# Patient Record
Sex: Female | Born: 1940 | Race: White | Hispanic: No | Marital: Married | Smoking: Former smoker
Health system: Southern US, Community
[De-identification: ages and names within clinical notes are randomized; demographics above are authoritative.]

## PROBLEM LIST (undated history)

## (undated) DIAGNOSIS — K219 Gastro-esophageal reflux disease without esophagitis: Secondary | ICD-10-CM

## (undated) DIAGNOSIS — M1990 Unspecified osteoarthritis, unspecified site: Secondary | ICD-10-CM

## (undated) DIAGNOSIS — I712 Thoracic aortic aneurysm, without rupture, unspecified: Secondary | ICD-10-CM

## (undated) DIAGNOSIS — I350 Nonrheumatic aortic (valve) stenosis: Secondary | ICD-10-CM

## (undated) DIAGNOSIS — D649 Anemia, unspecified: Secondary | ICD-10-CM

## (undated) DIAGNOSIS — I4891 Unspecified atrial fibrillation: Secondary | ICD-10-CM

## (undated) DIAGNOSIS — I10 Essential (primary) hypertension: Secondary | ICD-10-CM

## (undated) DIAGNOSIS — I639 Cerebral infarction, unspecified: Secondary | ICD-10-CM

## (undated) DIAGNOSIS — N200 Calculus of kidney: Secondary | ICD-10-CM

## (undated) DIAGNOSIS — I340 Nonrheumatic mitral (valve) insufficiency: Secondary | ICD-10-CM

## (undated) DIAGNOSIS — I255 Ischemic cardiomyopathy: Secondary | ICD-10-CM

## (undated) DIAGNOSIS — S32009A Unspecified fracture of unspecified lumbar vertebra, initial encounter for closed fracture: Secondary | ICD-10-CM

## (undated) DIAGNOSIS — N83202 Unspecified ovarian cyst, left side: Secondary | ICD-10-CM

## (undated) DIAGNOSIS — I499 Cardiac arrhythmia, unspecified: Secondary | ICD-10-CM

## (undated) DIAGNOSIS — I2510 Atherosclerotic heart disease of native coronary artery without angina pectoris: Secondary | ICD-10-CM

## (undated) DIAGNOSIS — D071 Carcinoma in situ of vulva: Secondary | ICD-10-CM

## (undated) DIAGNOSIS — I5022 Chronic systolic (congestive) heart failure: Secondary | ICD-10-CM

## (undated) DIAGNOSIS — I4901 Ventricular fibrillation: Principal | ICD-10-CM

## (undated) DIAGNOSIS — I82619 Acute embolism and thrombosis of superficial veins of unspecified upper extremity: Secondary | ICD-10-CM

## (undated) DIAGNOSIS — Z95810 Presence of automatic (implantable) cardiac defibrillator: Secondary | ICD-10-CM

## (undated) DIAGNOSIS — K5792 Diverticulitis of intestine, part unspecified, without perforation or abscess without bleeding: Secondary | ICD-10-CM

## (undated) DIAGNOSIS — D219 Benign neoplasm of connective and other soft tissue, unspecified: Secondary | ICD-10-CM

## (undated) HISTORY — DX: Anemia, unspecified: D64.9

## (undated) HISTORY — DX: Gastro-esophageal reflux disease without esophagitis: K21.9

## (undated) HISTORY — PX: CATARACT EXTRACTION: SUR2

## (undated) HISTORY — PX: FOOT SURGERY: SHX648

## (undated) HISTORY — DX: Carcinoma in situ of vulva: D07.1

## (undated) HISTORY — PX: EYE SURGERY: SHX253

## (undated) HISTORY — DX: Benign neoplasm of connective and other soft tissue, unspecified: D21.9

## (undated) HISTORY — DX: Unspecified atrial fibrillation: I48.91

## (undated) HISTORY — DX: Diverticulitis of intestine, part unspecified, without perforation or abscess without bleeding: K57.92

## (undated) HISTORY — DX: Unspecified ovarian cyst, left side: N83.202

## (undated) HISTORY — DX: Essential (primary) hypertension: I10

## (undated) HISTORY — PX: OTHER SURGICAL HISTORY: SHX169

## (undated) HISTORY — DX: Calculus of kidney: N20.0

## (undated) HISTORY — DX: Cerebral infarction, unspecified: I63.9

---

## 1999-03-25 ENCOUNTER — Other Ambulatory Visit: Admission: RE | Admit: 1999-03-25 | Discharge: 1999-03-25 | Payer: Self-pay | Admitting: Obstetrics and Gynecology

## 1999-03-31 ENCOUNTER — Other Ambulatory Visit: Admission: RE | Admit: 1999-03-31 | Discharge: 1999-03-31 | Payer: Self-pay | Admitting: Obstetrics and Gynecology

## 1999-03-31 ENCOUNTER — Encounter (INDEPENDENT_AMBULATORY_CARE_PROVIDER_SITE_OTHER): Payer: Self-pay | Admitting: Specialist

## 1999-05-27 ENCOUNTER — Ambulatory Visit (HOSPITAL_COMMUNITY): Admission: RE | Admit: 1999-05-27 | Discharge: 1999-05-27 | Payer: Self-pay | Admitting: Obstetrics and Gynecology

## 1999-05-27 ENCOUNTER — Encounter (INDEPENDENT_AMBULATORY_CARE_PROVIDER_SITE_OTHER): Payer: Self-pay | Admitting: Specialist

## 2000-04-12 ENCOUNTER — Other Ambulatory Visit: Admission: RE | Admit: 2000-04-12 | Discharge: 2000-04-12 | Payer: Self-pay | Admitting: Obstetrics and Gynecology

## 2000-09-01 DIAGNOSIS — D126 Benign neoplasm of colon, unspecified: Secondary | ICD-10-CM

## 2000-09-02 ENCOUNTER — Encounter (INDEPENDENT_AMBULATORY_CARE_PROVIDER_SITE_OTHER): Payer: Self-pay | Admitting: Specialist

## 2000-09-02 ENCOUNTER — Other Ambulatory Visit: Admission: RE | Admit: 2000-09-02 | Discharge: 2000-09-02 | Payer: Self-pay | Admitting: Gastroenterology

## 2001-05-02 ENCOUNTER — Other Ambulatory Visit: Admission: RE | Admit: 2001-05-02 | Discharge: 2001-05-02 | Payer: Self-pay | Admitting: Obstetrics and Gynecology

## 2001-09-19 ENCOUNTER — Encounter: Admission: RE | Admit: 2001-09-19 | Discharge: 2001-09-19 | Payer: Self-pay | Admitting: Family Medicine

## 2001-09-19 ENCOUNTER — Encounter: Payer: Self-pay | Admitting: Family Medicine

## 2001-10-27 ENCOUNTER — Ambulatory Visit (HOSPITAL_COMMUNITY): Admission: RE | Admit: 2001-10-27 | Discharge: 2001-10-27 | Payer: Self-pay | Admitting: Gastroenterology

## 2002-07-05 ENCOUNTER — Other Ambulatory Visit: Admission: RE | Admit: 2002-07-05 | Discharge: 2002-07-05 | Payer: Self-pay | Admitting: Obstetrics and Gynecology

## 2004-06-25 ENCOUNTER — Other Ambulatory Visit: Admission: RE | Admit: 2004-06-25 | Discharge: 2004-06-25 | Payer: Self-pay | Admitting: Obstetrics and Gynecology

## 2004-07-10 ENCOUNTER — Ambulatory Visit: Payer: Self-pay | Admitting: Gastroenterology

## 2004-07-23 ENCOUNTER — Ambulatory Visit: Payer: Self-pay | Admitting: Gastroenterology

## 2005-06-30 ENCOUNTER — Other Ambulatory Visit: Admission: RE | Admit: 2005-06-30 | Discharge: 2005-06-30 | Payer: Self-pay | Admitting: Obstetrics and Gynecology

## 2006-03-08 HISTORY — PX: MITRAL VALVE REPLACEMENT: SHX147

## 2006-03-08 HISTORY — PX: AORTIC ROOT REPLACEMENT: SHX1178

## 2006-03-08 HISTORY — PX: OTHER SURGICAL HISTORY: SHX169

## 2006-06-24 ENCOUNTER — Ambulatory Visit: Payer: Self-pay | Admitting: Cardiology

## 2006-06-24 ENCOUNTER — Encounter (INDEPENDENT_AMBULATORY_CARE_PROVIDER_SITE_OTHER): Payer: Self-pay | Admitting: Neurology

## 2006-06-24 ENCOUNTER — Inpatient Hospital Stay (HOSPITAL_COMMUNITY): Admission: EM | Admit: 2006-06-24 | Discharge: 2006-07-13 | Payer: Self-pay | Admitting: Emergency Medicine

## 2006-06-27 ENCOUNTER — Ambulatory Visit: Payer: Self-pay | Admitting: Cardiovascular Disease

## 2006-06-27 ENCOUNTER — Encounter: Payer: Self-pay | Admitting: Cardiovascular Disease

## 2006-07-01 ENCOUNTER — Encounter: Payer: Self-pay | Admitting: Cardiology

## 2006-07-01 ENCOUNTER — Ambulatory Visit: Payer: Self-pay | Admitting: Thoracic Surgery (Cardiothoracic Vascular Surgery)

## 2006-07-04 ENCOUNTER — Encounter: Payer: Self-pay | Admitting: Vascular Surgery

## 2006-07-07 ENCOUNTER — Encounter (INDEPENDENT_AMBULATORY_CARE_PROVIDER_SITE_OTHER): Payer: Self-pay | Admitting: Specialist

## 2006-07-18 ENCOUNTER — Ambulatory Visit: Payer: Self-pay | Admitting: Internal Medicine

## 2006-07-26 ENCOUNTER — Ambulatory Visit: Payer: Self-pay | Admitting: Cardiology

## 2006-07-26 LAB — CONVERTED CEMR LAB
Alkaline Phosphatase: 102 units/L (ref 39–117)
BUN: 11 mg/dL (ref 6–23)
Basophils Relative: 0.5 % (ref 0.0–1.0)
CO2: 25 meq/L (ref 19–32)
Creatinine, Ser: 0.6 mg/dL (ref 0.4–1.2)
Eosinophils Relative: 3.8 % (ref 0.0–5.0)
HCT: 28.4 % — ABNORMAL LOW (ref 36.0–46.0)
Hemoglobin: 9.4 g/dL — ABNORMAL LOW (ref 12.0–15.0)
LDL Cholesterol: 57 mg/dL (ref 0–99)
Monocytes Absolute: 0.3 10*3/uL (ref 0.2–0.7)
Neutrophils Relative %: 78.8 % — ABNORMAL HIGH (ref 43.0–77.0)
Potassium: 3.9 meq/L (ref 3.5–5.1)
RDW: 14.8 % — ABNORMAL HIGH (ref 11.5–14.6)
Sodium: 141 meq/L (ref 135–145)
Total Bilirubin: 0.6 mg/dL (ref 0.3–1.2)
Total Protein: 6.6 g/dL (ref 6.0–8.3)
VLDL: 26 mg/dL (ref 0–40)
WBC: 7.2 10*3/uL (ref 4.5–10.5)

## 2006-07-29 ENCOUNTER — Ambulatory Visit: Payer: Self-pay | Admitting: Internal Medicine

## 2006-08-04 ENCOUNTER — Emergency Department (HOSPITAL_COMMUNITY): Admission: EM | Admit: 2006-08-04 | Discharge: 2006-08-04 | Payer: Self-pay | Admitting: Emergency Medicine

## 2006-08-04 ENCOUNTER — Ambulatory Visit: Payer: Self-pay | Admitting: Cardiology

## 2006-08-05 ENCOUNTER — Encounter (HOSPITAL_COMMUNITY): Admission: RE | Admit: 2006-08-05 | Discharge: 2006-11-03 | Payer: Self-pay | Admitting: Cardiology

## 2006-08-09 ENCOUNTER — Ambulatory Visit: Payer: Self-pay | Admitting: Cardiology

## 2006-08-10 ENCOUNTER — Ambulatory Visit: Payer: Self-pay | Admitting: Thoracic Surgery (Cardiothoracic Vascular Surgery)

## 2006-08-12 ENCOUNTER — Ambulatory Visit: Payer: Self-pay | Admitting: Cardiology

## 2006-08-12 LAB — CONVERTED CEMR LAB
BUN: 16 mg/dL (ref 6–23)
Basophils Absolute: 0 10*3/uL (ref 0.0–0.1)
Calcium: 8.9 mg/dL (ref 8.4–10.5)
Chloride: 101 meq/L (ref 96–112)
Creatinine, Ser: 0.8 mg/dL (ref 0.4–1.2)
HCT: 29.6 % — ABNORMAL LOW (ref 36.0–46.0)
MCHC: 32.7 g/dL (ref 30.0–36.0)
Monocytes Relative: 4.1 % (ref 3.0–11.0)
Neutrophils Relative %: 81.9 % — ABNORMAL HIGH (ref 43.0–77.0)
Pro B Natriuretic peptide (BNP): 388 pg/mL — ABNORMAL HIGH (ref 0.0–100.0)
RBC: 3.44 M/uL — ABNORMAL LOW (ref 3.87–5.11)
RDW: 14.2 % (ref 11.5–14.6)

## 2006-08-17 ENCOUNTER — Ambulatory Visit: Payer: Self-pay

## 2006-08-17 ENCOUNTER — Encounter: Payer: Self-pay | Admitting: Cardiology

## 2006-08-19 ENCOUNTER — Ambulatory Visit: Payer: Self-pay | Admitting: Cardiovascular Disease

## 2006-08-22 ENCOUNTER — Ambulatory Visit: Payer: Self-pay | Admitting: Thoracic Surgery (Cardiothoracic Vascular Surgery)

## 2006-08-22 ENCOUNTER — Encounter
Admission: RE | Admit: 2006-08-22 | Discharge: 2006-08-22 | Payer: Self-pay | Admitting: Thoracic Surgery (Cardiothoracic Vascular Surgery)

## 2006-08-29 ENCOUNTER — Ambulatory Visit: Payer: Self-pay | Admitting: Cardiovascular Disease

## 2006-09-07 ENCOUNTER — Ambulatory Visit: Payer: Self-pay | Admitting: Cardiology

## 2006-09-07 LAB — CONVERTED CEMR LAB
Bilirubin, Direct: 0.1 mg/dL (ref 0.0–0.3)
CO2: 29 meq/L (ref 19–32)
Cholesterol: 126 mg/dL (ref 0–200)
GFR calc Af Amer: 81 mL/min
Glucose, Bld: 141 mg/dL — ABNORMAL HIGH (ref 70–99)
HDL: 44.5 mg/dL (ref >39.0)
Potassium: 4.5 meq/L (ref 3.5–5.1)
Total Bilirubin: 0.5 mg/dL (ref 0.3–1.2)
Total CHOL/HDL Ratio: 2.8
Total Protein: 7 g/dL (ref 6.0–8.3)
Triglycerides: 138 mg/dL (ref 0–149)

## 2006-09-14 ENCOUNTER — Ambulatory Visit: Payer: Self-pay | Admitting: Cardiology

## 2006-09-15 ENCOUNTER — Other Ambulatory Visit: Admission: RE | Admit: 2006-09-15 | Discharge: 2006-09-15 | Payer: Self-pay | Admitting: Obstetrics and Gynecology

## 2006-09-27 ENCOUNTER — Ambulatory Visit: Payer: Self-pay | Admitting: Cardiology

## 2006-10-03 ENCOUNTER — Ambulatory Visit: Payer: Self-pay | Admitting: Thoracic Surgery (Cardiothoracic Vascular Surgery)

## 2006-10-03 ENCOUNTER — Encounter
Admission: RE | Admit: 2006-10-03 | Discharge: 2006-10-03 | Payer: Self-pay | Admitting: Thoracic Surgery (Cardiothoracic Vascular Surgery)

## 2006-10-05 ENCOUNTER — Ambulatory Visit: Payer: Self-pay | Admitting: Cardiology

## 2006-10-05 LAB — CONVERTED CEMR LAB
Calcium: 9.2 mg/dL (ref 8.4–10.5)
Chloride: 103 meq/L (ref 96–112)
GFR calc Af Amer: 81 mL/min
GFR calc non Af Amer: 67 mL/min
Pro B Natriuretic peptide (BNP): 319 pg/mL — ABNORMAL HIGH (ref 0.0–100.0)
Sodium: 137 meq/L (ref 135–145)

## 2006-10-19 ENCOUNTER — Ambulatory Visit: Payer: Self-pay | Admitting: Internal Medicine

## 2006-10-28 ENCOUNTER — Ambulatory Visit: Payer: Self-pay | Admitting: Cardiovascular Disease

## 2006-11-03 ENCOUNTER — Ambulatory Visit: Payer: Self-pay | Admitting: Cardiology

## 2006-11-04 ENCOUNTER — Encounter (HOSPITAL_COMMUNITY): Admission: RE | Admit: 2006-11-04 | Discharge: 2006-12-06 | Payer: Self-pay | Admitting: Cardiology

## 2006-11-14 ENCOUNTER — Ambulatory Visit: Payer: Self-pay | Admitting: Cardiology

## 2006-11-21 ENCOUNTER — Ambulatory Visit: Payer: Self-pay | Admitting: Internal Medicine

## 2006-12-05 ENCOUNTER — Ambulatory Visit: Payer: Self-pay | Admitting: Cardiovascular Disease

## 2006-12-19 ENCOUNTER — Ambulatory Visit: Payer: Self-pay | Admitting: Cardiology

## 2007-01-09 ENCOUNTER — Ambulatory Visit: Payer: Self-pay | Admitting: Thoracic Surgery (Cardiothoracic Vascular Surgery)

## 2007-01-09 ENCOUNTER — Encounter
Admission: RE | Admit: 2007-01-09 | Discharge: 2007-01-09 | Payer: Self-pay | Admitting: Thoracic Surgery (Cardiothoracic Vascular Surgery)

## 2007-01-09 ENCOUNTER — Ambulatory Visit: Payer: Self-pay | Admitting: Cardiovascular Disease

## 2007-01-09 ENCOUNTER — Ambulatory Visit: Payer: Self-pay | Admitting: Cardiology

## 2007-01-09 LAB — CONVERTED CEMR LAB
BUN: 15 mg/dL (ref 6–23)
CO2: 28 meq/L (ref 19–32)
GFR calc Af Amer: 93 mL/min
Potassium: 3.9 meq/L (ref 3.5–5.1)
Prothrombin Time: 18 s — ABNORMAL HIGH (ref 10.9–13.3)
Sodium: 139 meq/L (ref 135–145)

## 2007-01-16 ENCOUNTER — Ambulatory Visit: Payer: Self-pay | Admitting: Cardiology

## 2007-02-06 ENCOUNTER — Ambulatory Visit: Payer: Self-pay | Admitting: Internal Medicine

## 2007-03-06 ENCOUNTER — Ambulatory Visit: Payer: Self-pay | Admitting: Cardiology

## 2007-03-20 ENCOUNTER — Ambulatory Visit: Payer: Self-pay | Admitting: Cardiology

## 2007-04-05 ENCOUNTER — Ambulatory Visit: Payer: Self-pay | Admitting: Cardiology

## 2007-04-13 ENCOUNTER — Ambulatory Visit: Payer: Self-pay | Admitting: Cardiology

## 2007-04-17 ENCOUNTER — Ambulatory Visit: Payer: Self-pay | Admitting: Cardiology

## 2007-04-17 LAB — CONVERTED CEMR LAB
AST: 15 units/L (ref 0–37)
Basophils Relative: 0.7 % (ref 0.0–1.0)
Bilirubin, Direct: 0.1 mg/dL (ref 0.0–0.3)
Eosinophils Relative: 4.3 % (ref 0.0–5.0)
HCT: 26.9 % — ABNORMAL LOW (ref 36.0–46.0)
HDL: 48.2 mg/dL (ref >39.0)
Hemoglobin: 8.7 g/dL — ABNORMAL LOW (ref 12.0–15.0)
LDL Cholesterol: 66 mg/dL (ref 0–99)
Lymphocytes Relative: 17.3 % (ref 12.0–46.0)
Monocytes Absolute: 0.4 10*3/uL (ref 0.2–0.7)
Monocytes Relative: 5.4 % (ref 3.0–11.0)
Neutro Abs: 5.5 10*3/uL (ref 1.4–7.7)
Neutrophils Relative %: 72.3 % (ref 43.0–77.0)
RDW: 14.1 % (ref 11.5–14.6)
Total Bilirubin: 0.5 mg/dL (ref 0.3–1.2)
Total Protein: 6.7 g/dL (ref 6.0–8.3)
VLDL: 23 mg/dL (ref 0–40)

## 2007-04-24 ENCOUNTER — Ambulatory Visit: Payer: Self-pay | Admitting: Cardiovascular Disease

## 2007-05-10 ENCOUNTER — Ambulatory Visit: Payer: Self-pay | Admitting: Cardiology

## 2007-05-29 ENCOUNTER — Ambulatory Visit: Payer: Self-pay | Admitting: Cardiology

## 2007-06-02 ENCOUNTER — Ambulatory Visit: Payer: Self-pay | Admitting: Cardiology

## 2007-06-02 ENCOUNTER — Encounter: Payer: Self-pay | Admitting: Cardiology

## 2007-06-02 LAB — CONVERTED CEMR LAB: Pro B Natriuretic peptide (BNP): 386 pg/mL — ABNORMAL HIGH (ref 0.0–100.0)

## 2007-06-06 ENCOUNTER — Ambulatory Visit: Payer: Self-pay | Admitting: Gastroenterology

## 2007-06-06 DIAGNOSIS — D509 Iron deficiency anemia, unspecified: Secondary | ICD-10-CM

## 2007-06-09 ENCOUNTER — Ambulatory Visit: Payer: Self-pay | Admitting: Cardiology

## 2007-06-09 LAB — CONVERTED CEMR LAB
Calcium: 9.1 mg/dL (ref 8.4–10.5)
Creatinine, Ser: 0.7 mg/dL (ref 0.4–1.2)
GFR calc Af Amer: 108 mL/min
Pro B Natriuretic peptide (BNP): 158 pg/mL — ABNORMAL HIGH (ref 0.0–100.0)
Sodium: 139 meq/L (ref 135–145)

## 2007-06-14 ENCOUNTER — Ambulatory Visit: Payer: Self-pay | Admitting: Gastroenterology

## 2007-06-14 LAB — CONVERTED CEMR LAB
OCCULT 1: NEGATIVE
OCCULT 4: NEGATIVE

## 2007-06-19 ENCOUNTER — Ambulatory Visit: Payer: Self-pay | Admitting: Cardiology

## 2007-07-04 ENCOUNTER — Ambulatory Visit: Payer: Self-pay | Admitting: Cardiology

## 2007-07-05 ENCOUNTER — Ambulatory Visit: Payer: Self-pay | Admitting: Cardiology

## 2007-07-06 ENCOUNTER — Ambulatory Visit: Payer: Self-pay | Admitting: Cardiology

## 2007-07-07 ENCOUNTER — Encounter: Payer: Self-pay | Admitting: Gastroenterology

## 2007-07-07 ENCOUNTER — Telehealth: Payer: Self-pay | Admitting: Gastroenterology

## 2007-07-07 ENCOUNTER — Ambulatory Visit: Payer: Self-pay | Admitting: Gastroenterology

## 2007-07-07 DIAGNOSIS — K5730 Diverticulosis of large intestine without perforation or abscess without bleeding: Secondary | ICD-10-CM | POA: Insufficient documentation

## 2007-07-07 LAB — HM COLONOSCOPY

## 2007-07-10 ENCOUNTER — Ambulatory Visit: Payer: Self-pay | Admitting: Internal Medicine

## 2007-07-10 ENCOUNTER — Ambulatory Visit: Payer: Self-pay | Admitting: Cardiology

## 2007-07-10 LAB — CONVERTED CEMR LAB
Eosinophils Relative: 4.4 % (ref 0.0–5.0)
HCT: 29.8 % — ABNORMAL LOW (ref 36.0–46.0)
Hemoglobin: 9.4 g/dL — ABNORMAL LOW (ref 12.0–15.0)
INR: 1.8 — ABNORMAL HIGH (ref 0.8–1.0)
Lymphocytes Relative: 15.1 % (ref 12.0–46.0)
Monocytes Absolute: 0.5 10*3/uL (ref 0.1–1.0)
Monocytes Relative: 6.5 % (ref 3.0–12.0)
Neutro Abs: 5.5 10*3/uL (ref 1.4–7.7)
Platelets: 500 10*3/uL — ABNORMAL HIGH (ref 150–400)
Prothrombin Time: 20.4 s — ABNORMAL HIGH (ref 10.9–13.3)
RDW: 16 % — ABNORMAL HIGH (ref 11.5–14.6)
WBC: 7.5 10*3/uL (ref 4.5–10.5)

## 2007-07-11 ENCOUNTER — Telehealth: Payer: Self-pay | Admitting: Gastroenterology

## 2007-07-11 ENCOUNTER — Ambulatory Visit: Payer: Self-pay | Admitting: Cardiology

## 2007-07-11 LAB — CONVERTED CEMR LAB
INR: 2.5 — ABNORMAL HIGH (ref 0.8–1.0)
Prothrombin Time: 27 s — ABNORMAL HIGH (ref 10.9–13.3)

## 2007-07-17 ENCOUNTER — Ambulatory Visit: Payer: Self-pay | Admitting: Thoracic Surgery (Cardiothoracic Vascular Surgery)

## 2007-07-17 ENCOUNTER — Telehealth: Payer: Self-pay | Admitting: Gastroenterology

## 2007-07-17 ENCOUNTER — Ambulatory Visit: Payer: Self-pay | Admitting: Cardiology

## 2007-07-21 ENCOUNTER — Ambulatory Visit: Payer: Self-pay | Admitting: Internal Medicine

## 2007-07-21 ENCOUNTER — Ambulatory Visit: Payer: Self-pay | Admitting: Gastroenterology

## 2007-07-21 DIAGNOSIS — I4891 Unspecified atrial fibrillation: Secondary | ICD-10-CM

## 2007-07-21 DIAGNOSIS — K8020 Calculus of gallbladder without cholecystitis without obstruction: Secondary | ICD-10-CM | POA: Insufficient documentation

## 2007-07-21 DIAGNOSIS — E785 Hyperlipidemia, unspecified: Secondary | ICD-10-CM

## 2007-07-21 DIAGNOSIS — I158 Other secondary hypertension: Secondary | ICD-10-CM | POA: Insufficient documentation

## 2007-07-21 DIAGNOSIS — E119 Type 2 diabetes mellitus without complications: Secondary | ICD-10-CM

## 2007-07-21 DIAGNOSIS — K449 Diaphragmatic hernia without obstruction or gangrene: Secondary | ICD-10-CM | POA: Insufficient documentation

## 2007-07-21 DIAGNOSIS — I359 Nonrheumatic aortic valve disorder, unspecified: Secondary | ICD-10-CM

## 2007-08-07 ENCOUNTER — Encounter: Payer: Self-pay | Admitting: Gastroenterology

## 2007-08-09 ENCOUNTER — Ambulatory Visit: Payer: Self-pay | Admitting: Gastroenterology

## 2007-08-09 DIAGNOSIS — K900 Celiac disease: Secondary | ICD-10-CM

## 2007-08-10 ENCOUNTER — Encounter: Payer: Self-pay | Admitting: Gastroenterology

## 2007-08-10 LAB — CONVERTED CEMR LAB
ALT: 8 units/L (ref 0–35)
BUN: 9 mg/dL (ref 6–23)
Basophils Relative: 0 % (ref 0.0–1.0)
CO2: 25 meq/L (ref 19–32)
Calcium: 8.8 mg/dL (ref 8.4–10.5)
Chloride: 106 meq/L (ref 96–112)
Creatinine, Ser: 0.8 mg/dL (ref 0.4–1.2)
Eosinophils Relative: 3.1 % (ref 0.0–5.0)
GFR calc Af Amer: 92 mL/min
GFR calc non Af Amer: 76 mL/min
Glucose, Bld: 174 mg/dL — ABNORMAL HIGH (ref 70–99)
Hemoglobin: 8.7 g/dL — ABNORMAL LOW (ref 12.0–15.0)
Lymphocytes Relative: 14.4 % (ref 12.0–46.0)
Neutro Abs: 6.8 10*3/uL (ref 1.4–7.7)
Neutrophils Relative %: 77.1 % — ABNORMAL HIGH (ref 43.0–77.0)
RBC: 3.51 M/uL — ABNORMAL LOW (ref 3.87–5.11)
Total Bilirubin: 0.7 mg/dL (ref 0.3–1.2)
WBC: 8.9 10*3/uL (ref 4.5–10.5)

## 2007-08-14 ENCOUNTER — Ambulatory Visit: Payer: Self-pay | Admitting: Cardiology

## 2007-08-21 ENCOUNTER — Encounter (HOSPITAL_COMMUNITY): Admission: RE | Admit: 2007-08-21 | Discharge: 2007-10-29 | Payer: Self-pay | Admitting: Internal Medicine

## 2007-08-21 ENCOUNTER — Telehealth: Payer: Self-pay | Admitting: Gastroenterology

## 2007-08-21 ENCOUNTER — Encounter: Payer: Self-pay | Admitting: Gastroenterology

## 2007-09-11 ENCOUNTER — Ambulatory Visit: Payer: Self-pay | Admitting: Internal Medicine

## 2007-09-27 ENCOUNTER — Ambulatory Visit: Payer: Self-pay | Admitting: Cardiology

## 2007-10-04 ENCOUNTER — Telehealth: Payer: Self-pay | Admitting: Gastroenterology

## 2007-10-04 ENCOUNTER — Ambulatory Visit: Payer: Self-pay | Admitting: Gastroenterology

## 2007-10-04 LAB — CONVERTED CEMR LAB
Basophils Absolute: 0.1 10*3/uL (ref 0.0–0.1)
Basophils Relative: 1.2 % (ref 0.0–3.0)
Lymphocytes Relative: 15.8 % (ref 12.0–46.0)
MCHC: 33.8 g/dL (ref 30.0–36.0)
Monocytes Relative: 6.8 % (ref 3.0–12.0)
Neutrophils Relative %: 72.9 % (ref 43.0–77.0)
RBC: 4.17 M/uL (ref 3.87–5.11)
WBC: 7.5 10*3/uL (ref 4.5–10.5)

## 2007-10-06 ENCOUNTER — Ambulatory Visit: Payer: Self-pay | Admitting: Gastroenterology

## 2007-10-18 ENCOUNTER — Ambulatory Visit: Payer: Self-pay | Admitting: Cardiology

## 2007-11-01 ENCOUNTER — Ambulatory Visit: Payer: Self-pay | Admitting: Cardiovascular Disease

## 2007-11-22 ENCOUNTER — Ambulatory Visit: Payer: Self-pay | Admitting: Cardiovascular Disease

## 2007-12-13 ENCOUNTER — Ambulatory Visit: Payer: Self-pay | Admitting: Obstetrics and Gynecology

## 2007-12-13 ENCOUNTER — Encounter: Payer: Self-pay | Admitting: Obstetrics and Gynecology

## 2007-12-13 ENCOUNTER — Other Ambulatory Visit: Admission: RE | Admit: 2007-12-13 | Discharge: 2007-12-13 | Payer: Self-pay | Admitting: Obstetrics and Gynecology

## 2007-12-18 ENCOUNTER — Ambulatory Visit: Payer: Self-pay | Admitting: Cardiology

## 2007-12-25 ENCOUNTER — Ambulatory Visit: Payer: Self-pay | Admitting: Thoracic Surgery (Cardiothoracic Vascular Surgery)

## 2008-01-02 ENCOUNTER — Telehealth: Payer: Self-pay | Admitting: Gastroenterology

## 2008-01-04 ENCOUNTER — Ambulatory Visit: Payer: Self-pay | Admitting: Gastroenterology

## 2008-01-04 LAB — CONVERTED CEMR LAB
Eosinophils Relative: 3.6 % (ref 0.0–5.0)
HCT: 35.5 % — ABNORMAL LOW (ref 36.0–46.0)
Hemoglobin: 12.3 g/dL (ref 12.0–15.0)
MCV: 90.5 fL (ref 78.0–100.0)
Monocytes Absolute: 0.3 10*3/uL (ref 0.1–1.0)
Monocytes Relative: 4.4 % (ref 3.0–12.0)
Neutro Abs: 6.2 10*3/uL (ref 1.4–7.7)
RDW: 12.9 % (ref 11.5–14.6)
Vitamin B-12: 251 pg/mL (ref 211–911)

## 2008-01-11 ENCOUNTER — Ambulatory Visit: Payer: Self-pay | Admitting: Obstetrics and Gynecology

## 2008-01-15 ENCOUNTER — Ambulatory Visit: Payer: Self-pay | Admitting: Cardiology

## 2008-01-29 ENCOUNTER — Ambulatory Visit: Payer: Self-pay | Admitting: Internal Medicine

## 2008-02-05 ENCOUNTER — Ambulatory Visit: Payer: Self-pay | Admitting: Gastroenterology

## 2008-02-05 ENCOUNTER — Ambulatory Visit: Payer: Self-pay | Admitting: Cardiology

## 2008-02-07 ENCOUNTER — Ambulatory Visit: Payer: Self-pay | Admitting: Gastroenterology

## 2008-02-07 LAB — CONVERTED CEMR LAB
Albumin: 3.7 g/dL (ref 3.5–5.2)
Basophils Absolute: 0 10*3/uL (ref 0.0–0.1)
HCT: 36.7 % (ref 36.0–46.0)
Hemoglobin: 12.6 g/dL (ref 12.0–15.0)
MCHC: 34.3 g/dL (ref 30.0–36.0)
Monocytes Absolute: 0.3 10*3/uL (ref 0.1–1.0)
Neutro Abs: 5.6 10*3/uL (ref 1.4–7.7)
RDW: 12.8 % (ref 11.5–14.6)
Saturation Ratios: 23.1 % (ref 20.0–50.0)
Total Protein: 7 g/dL (ref 6.0–8.3)
Transferrin: 269.3 mg/dL (ref >=212.0)
Vitamin B-12: 229 pg/mL (ref 211–911)

## 2008-02-26 ENCOUNTER — Ambulatory Visit: Payer: Self-pay | Admitting: Cardiology

## 2008-02-27 ENCOUNTER — Telehealth: Payer: Self-pay | Admitting: Gastroenterology

## 2008-03-11 ENCOUNTER — Ambulatory Visit: Payer: Self-pay | Admitting: Cardiology

## 2008-03-25 ENCOUNTER — Ambulatory Visit: Payer: Self-pay | Admitting: Cardiology

## 2008-03-25 LAB — CONVERTED CEMR LAB
Basophils Absolute: 0.1 10*3/uL (ref 0.0–0.1)
CO2: 31 meq/L (ref 19–32)
Calcium: 9.2 mg/dL (ref 8.4–10.5)
Lymphocytes Relative: 14.5 % (ref 12.0–46.0)
MCHC: 33.8 g/dL (ref 30.0–36.0)
Neutro Abs: 5.4 10*3/uL (ref 1.4–7.7)
Neutrophils Relative %: 76.3 % (ref 43.0–77.0)
RDW: 12.9 % (ref 11.5–14.6)
Sodium: 141 meq/L (ref 135–145)
TSH: 1.13 microintl units/mL (ref 0.35–5.50)

## 2008-04-15 ENCOUNTER — Ambulatory Visit: Payer: Self-pay | Admitting: Cardiology

## 2008-05-06 ENCOUNTER — Ambulatory Visit: Payer: Self-pay | Admitting: Cardiology

## 2008-05-07 ENCOUNTER — Ambulatory Visit: Payer: Self-pay | Admitting: Obstetrics and Gynecology

## 2008-05-13 ENCOUNTER — Ambulatory Visit: Payer: Self-pay | Admitting: Cardiology

## 2008-06-06 HISTORY — PX: CORONARY ARTERY BYPASS GRAFT: SHX141

## 2008-06-10 ENCOUNTER — Ambulatory Visit: Payer: Self-pay | Admitting: Cardiology

## 2008-06-20 ENCOUNTER — Telehealth: Payer: Self-pay | Admitting: Nurse Practitioner

## 2008-06-21 ENCOUNTER — Inpatient Hospital Stay (HOSPITAL_COMMUNITY): Admission: EM | Admit: 2008-06-21 | Discharge: 2008-07-02 | Payer: Self-pay | Admitting: Cardiovascular Disease

## 2008-06-21 ENCOUNTER — Ambulatory Visit: Payer: Self-pay | Admitting: Cardiology

## 2008-06-25 ENCOUNTER — Encounter: Payer: Self-pay | Admitting: Cardiology

## 2008-06-25 ENCOUNTER — Ambulatory Visit: Payer: Self-pay | Admitting: Surgery

## 2008-06-26 ENCOUNTER — Encounter: Payer: Self-pay | Admitting: Surgery

## 2008-06-27 ENCOUNTER — Encounter: Payer: Self-pay | Admitting: Thoracic Surgery (Cardiothoracic Vascular Surgery)

## 2008-07-04 ENCOUNTER — Ambulatory Visit: Payer: Self-pay | Admitting: Internal Medicine

## 2008-07-11 ENCOUNTER — Ambulatory Visit: Payer: Self-pay | Admitting: Internal Medicine

## 2008-07-19 ENCOUNTER — Ambulatory Visit: Payer: Self-pay | Admitting: Cardiology

## 2008-07-22 ENCOUNTER — Encounter: Payer: Self-pay | Admitting: Cardiology

## 2008-07-22 ENCOUNTER — Encounter
Admission: RE | Admit: 2008-07-22 | Discharge: 2008-07-22 | Payer: Self-pay | Admitting: Thoracic Surgery (Cardiothoracic Vascular Surgery)

## 2008-07-22 ENCOUNTER — Ambulatory Visit: Payer: Self-pay | Admitting: Thoracic Surgery (Cardiothoracic Vascular Surgery)

## 2008-07-24 ENCOUNTER — Ambulatory Visit: Payer: Self-pay | Admitting: Internal Medicine

## 2008-07-30 ENCOUNTER — Ambulatory Visit: Payer: Self-pay | Admitting: Cardiology

## 2008-07-30 DIAGNOSIS — I2589 Other forms of chronic ischemic heart disease: Secondary | ICD-10-CM

## 2008-07-30 DIAGNOSIS — Z954 Presence of other heart-valve replacement: Secondary | ICD-10-CM

## 2008-07-30 DIAGNOSIS — I10 Essential (primary) hypertension: Secondary | ICD-10-CM

## 2008-08-01 ENCOUNTER — Encounter (HOSPITAL_COMMUNITY): Admission: RE | Admit: 2008-08-01 | Discharge: 2008-10-30 | Payer: Self-pay | Admitting: Cardiology

## 2008-08-06 ENCOUNTER — Encounter: Payer: Self-pay | Admitting: *Deleted

## 2008-08-07 ENCOUNTER — Encounter: Payer: Self-pay | Admitting: Cardiology

## 2008-08-09 ENCOUNTER — Ambulatory Visit: Payer: Self-pay | Admitting: Cardiology

## 2008-08-09 LAB — CONVERTED CEMR LAB: Protime: 19.4

## 2008-08-26 ENCOUNTER — Ambulatory Visit: Payer: Self-pay | Admitting: Internal Medicine

## 2008-08-26 LAB — CONVERTED CEMR LAB: Protime: 18.1

## 2008-08-28 ENCOUNTER — Encounter: Payer: Self-pay | Admitting: Cardiology

## 2008-08-30 ENCOUNTER — Encounter (INDEPENDENT_AMBULATORY_CARE_PROVIDER_SITE_OTHER): Payer: Self-pay | Admitting: *Deleted

## 2008-08-30 ENCOUNTER — Telehealth: Payer: Self-pay | Admitting: Cardiology

## 2008-09-10 ENCOUNTER — Encounter: Payer: Self-pay | Admitting: Cardiology

## 2008-09-10 ENCOUNTER — Ambulatory Visit: Payer: Self-pay

## 2008-09-11 ENCOUNTER — Encounter: Payer: Self-pay | Admitting: *Deleted

## 2008-09-23 ENCOUNTER — Telehealth: Payer: Self-pay | Admitting: Internal Medicine

## 2008-09-23 ENCOUNTER — Ambulatory Visit: Payer: Self-pay | Admitting: Cardiology

## 2008-09-30 ENCOUNTER — Encounter: Payer: Self-pay | Admitting: Cardiology

## 2008-10-01 ENCOUNTER — Encounter: Payer: Self-pay | Admitting: Cardiology

## 2008-10-01 ENCOUNTER — Ambulatory Visit: Payer: Self-pay | Admitting: Internal Medicine

## 2008-10-01 LAB — CONVERTED CEMR LAB: Prothrombin Time: 17.3 s

## 2008-10-03 ENCOUNTER — Encounter (INDEPENDENT_AMBULATORY_CARE_PROVIDER_SITE_OTHER): Payer: Self-pay | Admitting: *Deleted

## 2008-10-16 ENCOUNTER — Ambulatory Visit: Payer: Self-pay | Admitting: Internal Medicine

## 2008-10-16 LAB — CONVERTED CEMR LAB
INR: 2.6
POC INR: 2.6

## 2008-10-17 ENCOUNTER — Ambulatory Visit: Payer: Self-pay | Admitting: Internal Medicine

## 2008-10-17 ENCOUNTER — Telehealth: Payer: Self-pay | Admitting: Internal Medicine

## 2008-10-17 LAB — CONVERTED CEMR LAB
ALT: 11 units/L (ref 0–35)
AST: 18 units/L (ref 0–37)
Basophils Relative: 0.1 % (ref 0.0–3.0)
Chloride: 102 meq/L (ref 96–112)
Cholesterol: 227 mg/dL — ABNORMAL HIGH (ref 0–200)
Direct LDL: 132.3 mg/dL
Eosinophils Relative: 3.3 % (ref 0.0–5.0)
HCT: 36.3 % (ref 36.0–46.0)
Hemoglobin: 12.2 g/dL (ref 12.0–15.0)
Ketones, ur: NEGATIVE mg/dL
Leukocytes, UA: NEGATIVE
Lymphs Abs: 0.9 10*3/uL (ref 0.7–4.0)
MCV: 91.2 fL (ref 78.0–100.0)
Magnesium: 1.8 mg/dL (ref 1.5–2.5)
Microalb Creat Ratio: 4.6 mg/g (ref 0.0–30.0)
Monocytes Absolute: 0.4 10*3/uL (ref 0.1–1.0)
Monocytes Relative: 5.6 % (ref 3.0–12.0)
Potassium: 4.3 meq/L (ref 3.5–5.1)
RBC: 3.99 M/uL (ref 3.87–5.11)
Saturation Ratios: 15 % — ABNORMAL LOW (ref 20.0–50.0)
Sodium: 140 meq/L (ref 135–145)
Specific Gravity, Urine: 1.01 (ref 1.000–1.030)
TSH: 3.58 microintl units/mL (ref 0.35–5.50)
Total CHOL/HDL Ratio: 3
Total Protein: 7.4 g/dL (ref 6.0–8.3)
Transferrin: 313.8 mg/dL (ref 212.0–360.0)
Triglycerides: 125 mg/dL (ref 0.0–149.0)
Urine Glucose: NEGATIVE mg/dL
Urobilinogen, UA: 1 (ref 0.0–1.0)
WBC: 6.5 10*3/uL (ref 4.5–10.5)
pH: 6.5 (ref 5.0–8.0)

## 2008-10-18 ENCOUNTER — Telehealth: Payer: Self-pay | Admitting: Internal Medicine

## 2008-10-22 ENCOUNTER — Telehealth: Payer: Self-pay | Admitting: Cardiology

## 2008-10-22 ENCOUNTER — Ambulatory Visit: Payer: Self-pay | Admitting: Internal Medicine

## 2008-10-22 DIAGNOSIS — E538 Deficiency of other specified B group vitamins: Secondary | ICD-10-CM | POA: Insufficient documentation

## 2008-10-28 ENCOUNTER — Ambulatory Visit: Payer: Self-pay | Admitting: Thoracic Surgery (Cardiothoracic Vascular Surgery)

## 2008-10-28 ENCOUNTER — Encounter
Admission: RE | Admit: 2008-10-28 | Discharge: 2008-10-28 | Payer: Self-pay | Admitting: Thoracic Surgery (Cardiothoracic Vascular Surgery)

## 2008-10-31 ENCOUNTER — Encounter (HOSPITAL_COMMUNITY): Admission: RE | Admit: 2008-10-31 | Discharge: 2009-01-10 | Payer: Self-pay | Admitting: Cardiology

## 2008-11-05 ENCOUNTER — Ambulatory Visit: Payer: Self-pay | Admitting: Internal Medicine

## 2008-11-05 DIAGNOSIS — E559 Vitamin D deficiency, unspecified: Secondary | ICD-10-CM | POA: Insufficient documentation

## 2008-11-07 ENCOUNTER — Ambulatory Visit: Payer: Self-pay | Admitting: Cardiology

## 2008-11-18 LAB — CONVERTED CEMR LAB: Pap Smear: NORMAL

## 2008-11-19 ENCOUNTER — Ambulatory Visit: Payer: Self-pay | Admitting: Internal Medicine

## 2008-11-20 ENCOUNTER — Ambulatory Visit: Payer: Self-pay | Admitting: Obstetrics and Gynecology

## 2008-11-26 ENCOUNTER — Ambulatory Visit: Payer: Self-pay | Admitting: Internal Medicine

## 2008-11-26 DIAGNOSIS — N390 Urinary tract infection, site not specified: Secondary | ICD-10-CM

## 2008-11-26 LAB — CONVERTED CEMR LAB
Blood in Urine, dipstick: NEGATIVE
Glucose, Urine, Semiquant: NEGATIVE
Nitrite: NEGATIVE
Protein, U semiquant: 30
Urobilinogen, UA: 0.2
pH: 5

## 2008-12-05 ENCOUNTER — Ambulatory Visit: Payer: Self-pay | Admitting: Internal Medicine

## 2008-12-11 ENCOUNTER — Encounter: Payer: Self-pay | Admitting: Internal Medicine

## 2008-12-13 ENCOUNTER — Encounter: Payer: Self-pay | Admitting: Cardiology

## 2008-12-16 ENCOUNTER — Ambulatory Visit: Payer: Self-pay | Admitting: Internal Medicine

## 2009-01-02 ENCOUNTER — Ambulatory Visit: Payer: Self-pay | Admitting: Cardiology

## 2009-01-02 ENCOUNTER — Telehealth: Payer: Self-pay | Admitting: Cardiology

## 2009-01-03 ENCOUNTER — Ambulatory Visit: Payer: Self-pay | Admitting: Internal Medicine

## 2009-01-16 ENCOUNTER — Ambulatory Visit: Payer: Self-pay | Admitting: Internal Medicine

## 2009-01-16 LAB — CONVERTED CEMR LAB: POC INR: 1.7

## 2009-01-27 ENCOUNTER — Ambulatory Visit: Payer: Self-pay | Admitting: Cardiology

## 2009-01-27 DIAGNOSIS — Z9889 Other specified postprocedural states: Secondary | ICD-10-CM | POA: Insufficient documentation

## 2009-01-31 ENCOUNTER — Ambulatory Visit: Payer: Self-pay | Admitting: Internal Medicine

## 2009-01-31 LAB — CONVERTED CEMR LAB: POC INR: 2.5

## 2009-02-06 ENCOUNTER — Encounter: Payer: Self-pay | Admitting: Internal Medicine

## 2009-02-12 ENCOUNTER — Other Ambulatory Visit: Admission: RE | Admit: 2009-02-12 | Discharge: 2009-02-12 | Payer: Self-pay | Admitting: Obstetrics and Gynecology

## 2009-02-12 ENCOUNTER — Ambulatory Visit: Payer: Self-pay | Admitting: Obstetrics and Gynecology

## 2009-02-24 ENCOUNTER — Ambulatory Visit: Payer: Self-pay | Admitting: Cardiology

## 2009-02-24 LAB — CONVERTED CEMR LAB: POC INR: 3

## 2009-03-24 ENCOUNTER — Ambulatory Visit: Payer: Self-pay | Admitting: Cardiology

## 2009-03-24 LAB — CONVERTED CEMR LAB: POC INR: 2.3

## 2009-04-21 ENCOUNTER — Ambulatory Visit: Payer: Self-pay | Admitting: Cardiology

## 2009-04-21 LAB — CONVERTED CEMR LAB: POC INR: 1.6

## 2009-04-28 ENCOUNTER — Ambulatory Visit: Payer: Self-pay | Admitting: Thoracic Surgery (Cardiothoracic Vascular Surgery)

## 2009-04-28 ENCOUNTER — Encounter: Payer: Self-pay | Admitting: Cardiology

## 2009-05-01 ENCOUNTER — Encounter: Payer: Self-pay | Admitting: Cardiology

## 2009-05-02 ENCOUNTER — Telehealth: Payer: Self-pay | Admitting: Cardiology

## 2009-05-02 ENCOUNTER — Telehealth (INDEPENDENT_AMBULATORY_CARE_PROVIDER_SITE_OTHER): Payer: Self-pay | Admitting: *Deleted

## 2009-05-06 ENCOUNTER — Ambulatory Visit: Payer: Self-pay | Admitting: Cardiology

## 2009-05-09 ENCOUNTER — Telehealth: Payer: Self-pay | Admitting: Internal Medicine

## 2009-05-15 ENCOUNTER — Ambulatory Visit: Payer: Self-pay | Admitting: Internal Medicine

## 2009-05-15 LAB — CONVERTED CEMR LAB
AST: 19 units/L (ref 0–37)
Alkaline Phosphatase: 69 units/L (ref 39–117)
Basophils Relative: 0.2 % (ref 0.0–3.0)
Bilirubin, Direct: 0.1 mg/dL (ref 0.0–0.3)
CO2: 31 meq/L (ref 19–32)
Calcium: 9.2 mg/dL (ref 8.4–10.5)
Creatinine, Ser: 0.8 mg/dL (ref 0.4–1.2)
Eosinophils Absolute: 0.2 10*3/uL (ref 0.0–0.7)
Folate: >20 ng/mL
GFR calc non Af Amer: 75.82 mL/min (ref >60)
Hemoglobin: 11.6 g/dL — ABNORMAL LOW (ref 12.0–15.0)
Hgb A1c MFr Bld: 6.1 % (ref 4.6–6.5)
Iron: 97 ug/dL (ref 42–145)
Lymphocytes Relative: 12.4 % (ref 12.0–46.0)
MCHC: 33.2 g/dL (ref 30.0–36.0)
Monocytes Relative: 4.1 % (ref 3.0–12.0)
Neutro Abs: 6 10*3/uL (ref 1.4–7.7)
Neutrophils Relative %: 81.1 % — ABNORMAL HIGH (ref 43.0–77.0)
RBC: 3.85 M/uL — ABNORMAL LOW (ref 3.87–5.11)
Saturation Ratios: 24 % (ref 20.0–50.0)
Sodium: 141 meq/L (ref 135–145)
Total Protein: 7.2 g/dL (ref 6.0–8.3)
Vit D, 25-Hydroxy: 36 ng/mL (ref 30–89)
WBC: 7.4 10*3/uL (ref 4.5–10.5)

## 2009-05-16 ENCOUNTER — Telehealth: Payer: Self-pay | Admitting: Cardiology

## 2009-05-19 ENCOUNTER — Ambulatory Visit: Payer: Self-pay | Admitting: Internal Medicine

## 2009-05-20 ENCOUNTER — Encounter: Payer: Self-pay | Admitting: Internal Medicine

## 2009-05-27 ENCOUNTER — Ambulatory Visit (HOSPITAL_COMMUNITY): Admission: RE | Admit: 2009-05-27 | Discharge: 2009-05-27 | Payer: Self-pay | Admitting: Cardiology

## 2009-05-27 ENCOUNTER — Encounter: Payer: Self-pay | Admitting: Cardiology

## 2009-05-27 ENCOUNTER — Ambulatory Visit: Payer: Self-pay

## 2009-05-27 ENCOUNTER — Ambulatory Visit: Payer: Self-pay | Admitting: Cardiology

## 2009-06-16 ENCOUNTER — Ambulatory Visit: Payer: Self-pay | Admitting: Cardiovascular Disease

## 2009-06-16 LAB — CONVERTED CEMR LAB: POC INR: 2.2

## 2009-06-30 ENCOUNTER — Telehealth: Payer: Self-pay | Admitting: Cardiology

## 2009-07-10 ENCOUNTER — Encounter (INDEPENDENT_AMBULATORY_CARE_PROVIDER_SITE_OTHER): Payer: Self-pay | Admitting: *Deleted

## 2009-07-14 ENCOUNTER — Ambulatory Visit: Payer: Self-pay | Admitting: Cardiology

## 2009-07-15 ENCOUNTER — Telehealth: Payer: Self-pay | Admitting: Gastroenterology

## 2009-07-16 ENCOUNTER — Telehealth: Payer: Self-pay | Admitting: Cardiology

## 2009-07-28 ENCOUNTER — Ambulatory Visit: Payer: Self-pay | Admitting: Cardiology

## 2009-08-11 ENCOUNTER — Ambulatory Visit: Payer: Self-pay | Admitting: Cardiology

## 2009-08-18 ENCOUNTER — Telehealth: Payer: Self-pay | Admitting: Cardiology

## 2009-08-27 ENCOUNTER — Telehealth: Payer: Self-pay | Admitting: Cardiology

## 2009-09-02 ENCOUNTER — Telehealth: Payer: Self-pay | Admitting: Cardiology

## 2009-09-09 ENCOUNTER — Ambulatory Visit: Payer: Self-pay | Admitting: Internal Medicine

## 2009-09-11 ENCOUNTER — Ambulatory Visit: Payer: Self-pay | Admitting: Internal Medicine

## 2009-09-11 ENCOUNTER — Ambulatory Visit: Payer: Self-pay

## 2009-09-15 ENCOUNTER — Telehealth: Payer: Self-pay | Admitting: Cardiology

## 2009-09-22 ENCOUNTER — Ambulatory Visit: Payer: Self-pay | Admitting: Cardiology

## 2009-09-22 LAB — CONVERTED CEMR LAB: POC INR: 2.6

## 2009-09-24 ENCOUNTER — Ambulatory Visit: Payer: Self-pay | Admitting: Internal Medicine

## 2009-09-24 LAB — CONVERTED CEMR LAB
Albumin: 4 g/dL (ref 3.5–5.2)
BUN: 12 mg/dL (ref 6–23)
CO2: 30 meq/L (ref 19–32)
Calcium: 8.9 mg/dL (ref 8.4–10.5)
Creatinine, Ser: 0.8 mg/dL (ref 0.4–1.2)
Glucose, Bld: 114 mg/dL — ABNORMAL HIGH (ref 70–99)
HDL: 57.7 mg/dL (ref >39.00)
Hgb A1c MFr Bld: 6.1 % (ref 4.6–6.5)
TSH: 1.03 microintl units/mL (ref 0.35–5.50)
Total Protein: 6.8 g/dL (ref 6.0–8.3)
Triglycerides: 116 mg/dL (ref 0.0–149.0)
VLDL: 23.2 mg/dL (ref 0.0–40.0)

## 2009-10-13 ENCOUNTER — Ambulatory Visit: Payer: Self-pay | Admitting: Cardiovascular Disease

## 2009-10-27 ENCOUNTER — Ambulatory Visit: Payer: Self-pay | Admitting: Cardiology

## 2009-10-28 ENCOUNTER — Telehealth: Payer: Self-pay | Admitting: Cardiology

## 2009-10-29 ENCOUNTER — Telehealth: Payer: Self-pay | Admitting: Cardiology

## 2009-10-29 ENCOUNTER — Inpatient Hospital Stay (HOSPITAL_COMMUNITY): Admission: EM | Admit: 2009-10-29 | Discharge: 2009-10-30 | Payer: Self-pay | Admitting: Emergency Medicine

## 2009-10-29 ENCOUNTER — Ambulatory Visit: Payer: Self-pay | Admitting: Cardiology

## 2009-10-29 ENCOUNTER — Encounter: Payer: Self-pay | Admitting: Cardiology

## 2009-11-06 ENCOUNTER — Ambulatory Visit: Payer: Self-pay | Admitting: Cardiology

## 2009-11-06 ENCOUNTER — Ambulatory Visit: Payer: Self-pay

## 2009-11-11 ENCOUNTER — Ambulatory Visit: Payer: Self-pay | Admitting: Internal Medicine

## 2009-11-11 LAB — CONVERTED CEMR LAB: POC INR: 3.6

## 2009-11-18 ENCOUNTER — Ambulatory Visit: Payer: Self-pay | Admitting: Cardiovascular Disease

## 2009-11-24 ENCOUNTER — Ambulatory Visit: Payer: Self-pay | Admitting: Obstetrics and Gynecology

## 2009-11-27 ENCOUNTER — Telehealth: Payer: Self-pay | Admitting: Cardiology

## 2009-12-08 ENCOUNTER — Ambulatory Visit: Payer: Self-pay | Admitting: Internal Medicine

## 2009-12-15 ENCOUNTER — Ambulatory Visit: Payer: Self-pay | Admitting: Cardiology

## 2009-12-15 LAB — CONVERTED CEMR LAB: POC INR: 2.6

## 2010-01-12 ENCOUNTER — Ambulatory Visit: Payer: Self-pay | Admitting: Cardiovascular Disease

## 2010-01-12 ENCOUNTER — Ambulatory Visit: Payer: Self-pay | Admitting: Cardiology

## 2010-01-12 LAB — CONVERTED CEMR LAB: POC INR: 2.7

## 2010-01-26 ENCOUNTER — Ambulatory Visit: Payer: Self-pay | Admitting: Internal Medicine

## 2010-01-26 LAB — CONVERTED CEMR LAB
AST: 20 units/L (ref 0–37)
Albumin: 4.1 g/dL (ref 3.5–5.2)
Alkaline Phosphatase: 81 units/L (ref 39–117)
Basophils Absolute: 0 10*3/uL (ref 0.0–0.1)
Chloride: 104 meq/L (ref 96–112)
Creatinine, Ser: 0.9 mg/dL (ref 0.4–1.2)
Folate: >20 ng/mL
Hemoglobin: 11.4 g/dL — ABNORMAL LOW (ref 12.0–15.0)
Lymphocytes Relative: 10.8 % — ABNORMAL LOW (ref 12.0–46.0)
Monocytes Relative: 5 % (ref 3.0–12.0)
Neutro Abs: 5.7 10*3/uL (ref 1.4–7.7)
Potassium: 4.9 meq/L (ref 3.5–5.1)
RDW: 14.3 % (ref 11.5–14.6)
Specific Gravity, Urine: 1.025 (ref 1.000–1.030)
Total Bilirubin: 0.4 mg/dL (ref 0.3–1.2)
Total Protein, Urine: NEGATIVE mg/dL
Urine Glucose: NEGATIVE mg/dL
Vit D, 25-Hydroxy: 58 ng/mL (ref 30–89)
Vitamin B-12: >1500 pg/mL — ABNORMAL HIGH (ref 211–911)
pH: 6 (ref 5.0–8.0)

## 2010-01-27 ENCOUNTER — Telehealth (INDEPENDENT_AMBULATORY_CARE_PROVIDER_SITE_OTHER): Payer: Self-pay | Admitting: *Deleted

## 2010-02-09 ENCOUNTER — Ambulatory Visit: Payer: Self-pay | Admitting: Internal Medicine

## 2010-02-23 ENCOUNTER — Encounter: Payer: Self-pay | Admitting: Internal Medicine

## 2010-02-23 LAB — HM MAMMOGRAPHY: HM Mammogram: NORMAL

## 2010-02-25 ENCOUNTER — Telehealth: Payer: Self-pay | Admitting: Internal Medicine

## 2010-03-16 ENCOUNTER — Ambulatory Visit
Admission: RE | Admit: 2010-03-16 | Discharge: 2010-03-16 | Payer: Self-pay | Source: Home / Self Care | Attending: Obstetrics and Gynecology | Admitting: Obstetrics and Gynecology

## 2010-03-16 ENCOUNTER — Ambulatory Visit: Admission: RE | Admit: 2010-03-16 | Discharge: 2010-03-16 | Payer: Self-pay | Source: Home / Self Care

## 2010-03-16 LAB — CONVERTED CEMR LAB: POC INR: 3.2

## 2010-03-17 ENCOUNTER — Encounter: Payer: Self-pay | Admitting: Internal Medicine

## 2010-03-18 ENCOUNTER — Telehealth: Payer: Self-pay | Admitting: Internal Medicine

## 2010-03-27 ENCOUNTER — Ambulatory Visit
Admission: RE | Admit: 2010-03-27 | Discharge: 2010-03-27 | Payer: Self-pay | Source: Home / Self Care | Attending: Internal Medicine | Admitting: Internal Medicine

## 2010-03-27 ENCOUNTER — Other Ambulatory Visit: Payer: Self-pay | Admitting: Internal Medicine

## 2010-03-27 LAB — URINALYSIS, ROUTINE W REFLEX MICROSCOPIC
Bilirubin Urine: NEGATIVE
Ketones, ur: NEGATIVE
Leukocytes, UA: NEGATIVE
Nitrite: NEGATIVE
Specific Gravity, Urine: 1.015 (ref 1.000–1.030)
Total Protein, Urine: NEGATIVE
Urine Glucose: NEGATIVE
Urobilinogen, UA: 1 (ref 0.0–1.0)
pH: 8.5 (ref 5.0–8.0)

## 2010-03-28 ENCOUNTER — Encounter: Payer: Self-pay | Admitting: Internal Medicine

## 2010-04-09 NOTE — Progress Notes (Signed)
Summary: question re med  Phone Note Call from Patient   Caller: Patient Reason for Call: Talk to Nurse Summary of Call: question re rx and a medication Initial call taken by: Lorenda Hatchet,  November 27, 2009 2:29 PM  Follow-up for Phone Call        spoke w/pt she would like a 90 day supply of lasix, new rx sent in. She also c/o a new symptoms she has been having since her flecanide dose was double in late Aug.  She states that only about 4-5 times and only in the AM when she 1st wakes up she has had strange vision changes.  She describes that she sees objects and its almost translucent, specially she states she has seen a leg.  These episodes only last just a few min and then its fine, she doesn't see any spots.  Ask her about seeing an eye doctor and she states she has in the past but its been a while and she feels nothing is wrong w/her vision.  Discussed w/Sally, Phd she hasn't heard of any symptoms like this, sometimes can cuase spots to appear, she feels probably not related to flecanide.  Will send mess to Dr Stanford Breed for review Kevan Rosebush, RN  November 27, 2009 3:13 PM   Additional Follow-up for Phone Call Additional follow up Details #1::        continue flecanide at present dose and follow Colin Mulders, MD, [facility]  November 28, 2009 7:53 AM   PT AWARE OF ABOVE WILL CALL IF INCREASE IN S/S. Additional Follow-up by: Devra Dopp, LPN,  September 23, 624THL 2:48 PM    New/Updated Medications: METOPROLOL SUCCINATE 50 MG XR24H-TAB (METOPROLOL SUCCINATE) 1 once daily Prescriptions: METOPROLOL SUCCINATE 50 MG XR24H-TAB (METOPROLOL SUCCINATE) 1 once daily  #90 x 30   Entered by:   Devra Dopp, LPN   Authorized by:   Colin Mulders, MD, [facility]   Signed by:   Devra Dopp, LPN on 624THL   Method used:   Electronically to        [id number] (retail)       [address]       [place], [place]  [address]       Ph: [phone] or [phone]  Fax: [phone]   RxIDKM:[id number] FUROSEMIDE 20 MG  TABS (FUROSEMIDE) take one tablet once daily  #90 x 3   Entered by:   Kevan Rosebush, RN   Authorized by:   Colin Mulders, MD, [facility]   Signed by:   Kevan Rosebush, RN on 11/27/2009   Method used:   Electronically to        [facility] (retail)       [address]       [place], [place]  [address]       Ph: [phone] or [phone]       Fax: [phone]   RxID:   [id number]

## 2010-04-09 NOTE — Progress Notes (Signed)
    PAP Screening:    Last PAP smear:  11/18/2008  Mammogram Screening:    Last Mammogram:  02/23/2010  Mammogram Results:    Date of Exam:  02/23/2010    Results:  Normal Bilateral  Osteoporosis Risk Assessment:  Risk Factors for Fracture or Low Bone Density:   Race (White or Asian):     yes   Hx of Fractures:       no   FH of Osteoporosis:     yes   Hx of falls:       no   Physically inactive:     no   Smoking status:       quit   High alcohol use:     no   Low calcium/Vit. D intake:   yes   Corticosteroid use:     no   Heparin use:       no   Thyroid disease:     no   Rheumatoid Arthritis:     no  Immunization & Chemoprophylaxis:    Tetanus vaccine: Tdap  (01/26/2010)    Influenza vaccine: Fluvax MCR  (01/26/2010)    Pneumovax: Pneumovax  (01/03/2009)

## 2010-04-09 NOTE — Medication Information (Signed)
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 no    Recent/future dental: no  Any missed doses?: no       Is patient compliant with meds? yes       Allergies: No Known Drug Allergies  Anticoagulation Management History:      The patient is taking warfarin and comes in today for a routine follow up visit.  Positive risk factors for bleeding include an age of 69 years or older and presence of serious comorbidities.  Negative risk factors for bleeding include no history of CVA/TIA.  The bleeding index is 'intermediate risk'.  Positive CHADS2 values include History of HTN and History of Diabetes.  Negative CHADS2 values include Age > 22 years old and Prior Stroke/CVA/TIA.  The start date was 07/11/2006.  Her last INR was 2.6.  Anticoagulation responsible provider: Stanford Breed MD, Aaron Edelman.  INR POC: 2.3.  Cuvette Lot#: [id number].  Exp: 06/2010.    Anticoagulation Management Assessment/Plan:      The patient's current anticoagulation dose is Warfarin sodium 4 mg tabs: Take as directed, Warfarin sodium 2 mg tabs: Take as directed by coumadin clinic..  The target INR is 2.0-3.0.  The next INR is due 04/21/2009.  Anticoagulation instructions were given to patient.  Results were reviewed/authorized by Alinda Deem, PharmD, BCPS, CPP.  She was notified by Merlyn Albert, Pharm D Candidate.         Prior Anticoagulation Instructions: INR 3.0  Start taking 2mg  daily except 4mg  on Tuesdays, Thursdays and Saturdays.  Recheck in 4 weeks.    Current Anticoagulation Instructions: INR:  2.3 Continue with same dosage of 2mg  daily except 4mg  on Tuesdays, Thursdays and Saturdays Recheck in 4 weeks

## 2010-04-09 NOTE — Letter (Signed)
Summary: Lipid Letter  [facility]   [place], [place] [address]   Phone: [phone]  Fax: [phone]    09/24/2009  [address] [place], [place]  [address]  Dear Ms. Fetterman:  We have carefully reviewed your last lipid profile from 09/24/2009 and the results are noted below with a summary of recommendations for lipid management.    Cholesterol:       197     Goal: <200   HDL "good" Cholesterol:   57.70     Goal: >40   LDL "bad" Cholesterol:   116     Goal: <130   Triglycerides:       116.0     Goal: <150        TLC Diet (Therapeutic Lifestyle Change): Saturated Fats & Transfatty acids should be kept < 7% of total calories ***Reduce Saturated Fats Polyunstaurated Fat can be up to 10% of total calories Monounsaturated Fat Fat can be up to 20% of total calories Total Fat should be no greater than 25-35% of total calories Carbohydrates should be 50-60% of total calories Protein should be approximately 15% of total calories Fiber should be at least 20-30 grams a day ***Increased fiber may help lower LDL Total Cholesterol should be < 200mg /day Consider adding plant stanol/sterols to diet (example: Benacol spread) ***A higher intake of unsaturated fat may reduce Triglycerides and Increase HDL    Adjunctive Measures (may lower LIPIDS and reduce risk of Heart Attack) include: Aerobic Exercise (20-30 minutes 3-4 times a week) Limit Alcohol Consumption Weight Reduction Aspirin 75-81 mg a day by mouth (if not allergic or contraindicated) Dietary Fiber 20-30 grams a day by mouth     Current Medications: 1)    Actos 45 Mg  Tabs (Pioglitazone hcl) .... Once daily 2)    Aspirin 81 Mg  Tbec (Aspirin) .... Once daily 3)    Furosemide 20 Mg  Tabs (Furosemide) .... Take one tablet once daily 4)    Klor-con M20 20 Meq  Tbcr (Potassium chloride crys cr) .... Once daily 5)    Metformin Hcl 500 Mg Xr24h-tab (Metformin hcl) .... 3 tablets at bedtime,  1 tablet in morning 6)    Prilosec Otc 20 Mg  Tbec (Omeprazole magnesium) .Marland Kitchen.. 1 once daily 7)    Cvs Iron 325 (65 Fe) Mg Tabs (Ferrous sulfate) .Marland Kitchen.. 1 tablet by mouth as needed 8)    Vitamin D 50000 Unit Caps (Ergocalciferol) .... One by mouth each week 9)    Warfarin Sodium 4 Mg Tabs (Warfarin sodium) .... Take as directed 10)    Warfarin Sodium 2 Mg Tabs (Warfarin sodium) .... Take as directed by coumadin clinic. 11)    Nascobal 500 Mcg/0.56ml Soln (Cyanocobalamin) .... One puff in a nostril once a week 12)    Micardis 20 Mg Tabs (Telmisartan) .... 1/2 tab by mouth once daily 13)    Metoprolol Succinate 100 Mg Xr24h-tab (Metoprolol succinate) .... Take 1.5 tablet by mouth daily 14)    Prilosec 20 Mg Cpdr (Omeprazole) .Marland Kitchen.. 1 tab by mouth once daily 15)    Calcium 600 1500 Mg Tabs (Calcium carbonate) .... Tab by mouth once daily 16)    Flecainide Acetate 50 Mg Tabs (Flecainide acetate) .... Take 1 tablet twice a day  If you have any questions, please call. We appreciate being able to work with you.   Sincerely,    [facility] Janith Lima MD

## 2010-04-09 NOTE — Assessment & Plan Note (Signed)
Summary: PER PT FU  STC   Vital Signs:  Patient profile:   70 year old female Height:      68 inches Weight:      191 pounds BMI:     29.15 O2 Sat:      96 % on Room air Temp:     98.0 degrees F oral Pulse rate:   75 / minute Pulse rhythm:   regular Resp:     16 per minute BP sitting:   16 / 64  (left arm) Cuff size:   large  Vitals Entered By: Estell Harpin CMA (May 15, 2009 9:46 AM)  Nutrition Counseling: Patient's BMI is greater than 25 and therefore counseled on weight management options.  O2 Flow:  Room air  Primary Care Provider:  Janith Lima MD   History of Present Illness: She returns for f/up and states that she feels well and is s/p an ER visit in [facility] for PAF associated with oral deongestant therapy 2 weeks ago.  Hypertension History:      She denies headache, chest pain, palpitations, dyspnea with exertion, orthopnea, peripheral edema, visual symptoms, neurologic problems, syncope, and side effects from treatment.  She notes no problems with any antihypertensive medication side effects.        Positive major cardiovascular risk factors include female age 51 years old or older, diabetes, hyperlipidemia, and hypertension.  Negative major cardiovascular risk factors include negative family history for ischemic heart disease and non-tobacco-user status.        Positive history for target organ damage include ASHD (either angina/prior MI/prior CABG).  Further assessment for target organ damage reveals no history of cardiac end-organ damage (CHF/LVH), stroke/TIA, peripheral vascular disease, renal insufficiency, or hypertensive retinopathy.     Current Medications (verified): 1)  Actos 45 Mg  Tabs (Pioglitazone Hcl) .... Once Daily 2)  Aspirin 81 Mg  Tbec (Aspirin) .... Once Daily 3)  Furosemide 20 Mg  Tabs (Furosemide) .... Take One Tablet Once Daily 4)  Klor-Con M20 20 Meq  Tbcr (Potassium Chloride Crys Cr) .... Once Daily 5)  Metformin Hcl 500 Mg  Xr24h-Tab (Metformin Hcl) .... 3 Tablets At Bedtime, 1 Tablet in Morning 6)  Prilosec Otc 20 Mg  Tbec (Omeprazole Magnesium) .Marland Kitchen.. 1 Once Daily 7)  Cvs Iron 325 (65 Fe) Mg Tabs (Ferrous Sulfate) .Marland Kitchen.. 1 Tablet By Mouth As Needed 8)  Vitamin D 50000 Unit Caps (Ergocalciferol) .... Every Other Week 9)  Warfarin Sodium 4 Mg Tabs (Warfarin Sodium) .... Take As Directed 10)  Warfarin Sodium 2 Mg Tabs (Warfarin Sodium) .... Take As Directed By Coumadin Clinic. 11)  Nascobal 500 Mcg/0.68ml Soln (Cyanocobalamin) .... One Puff in A Nostril Once A Week 12)  Micardis 20 Mg Tabs (Telmisartan) .... 1/2 Tab By Mouth Once Daily 13)  Metoprolol Succinate 100 Mg Xr24h-Tab (Metoprolol Succinate) .... Take One Tablet By Mouth Daily 14)  Prilosec 20 Mg Cpdr (Omeprazole) .Marland Kitchen.. 1 Tab By Mouth Once Daily 15)  Calcium 600 1500 Mg Tabs (Calcium Carbonate) .... Tab By Mouth Once Daily  Allergies (verified): No Known Drug Allergies  Past History:  Past Medical History: Reviewed history from 05/06/2009 and no changes required. 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 Redo Cabg 4/10 due to occluded LM at implantation site (LIMA to LAD, SVG to OM) FOOT SURGERY to remove neuroma  Family History: Reviewed history from 08/09/2007 and no changes required. Family History of Colon Cancer: grandmother, uncle Family History of Breast Cancer: aunt, cousin Family History of Diabetes: uncle, grandmother,uncle  Social History: Reviewed history from 01/25/2008 and no changes required. Patient is a former smoker.  quit 1985 Alcohol Use - no Illicit Drug Use - no Patient does not get regular  exercise.  Married   Review of Systems  The patient denies anorexia, fever, weight loss, weight gain, chest pain, peripheral edema, prolonged cough, hemoptysis, abdominal pain, suspicious skin lesions, abnormal bleeding, enlarged lymph nodes, and angioedema.   CV:  Denies chest pain or discomfort, fainting, fatigue, lightheadness, near fainting, palpitations, shortness of breath with exertion, swelling of feet, swelling of hands, and weight gain. Endo:  Complains of polyuria; denies cold intolerance, excessive hunger, excessive thirst, excessive urination, heat intolerance, and weight change. Heme:  Denies abnormal bruising, bleeding, enlarge lymph nodes, fevers, pallor, and skin discoloration.  Physical Exam  General:  well-developed well-nourished in no acute distress. Skin is warm and dry.overweight-appearing.   Head:  normocephalic and atraumatic.   Mouth:  Oral mucosa and oropharynx without lesions or exudates.  Teeth in good repair. Neck:  supple, full ROM, no masses, normal carotid upstroke, no carotid bruits, and no cervical lymphadenopathy.   Lungs:  Normal respiratory effort, chest expands symmetrically. Lungs are clear to auscultation, no crackles or wheezes. Heart:  regular rate and rhythm with [id number] systolic murmur left sternal border and right 2nd ICS with radiation to carotids. No diastolic murmur noted. no gallop, no rub, and no JVD.   Abdomen:  Bowel sounds positive,abdomen soft and non-tender without masses, organomegaly or hernias noted. No CVAT. Msk:  normal ROM, no joint tenderness, no joint swelling, and no joint warmth.   Pulses:  R and L carotid,radial,femoral,dorsalis pedis and posterior tibial pulses are full and equal bilaterally Extremities:  No clubbing, cyanosis, edema, or deformity noted with normal full range of motion of all joints.   Neurologic:  No cranial nerve deficits noted. Station and gait are normal. Plantar reflexes are down-going bilaterally. DTRs are  symmetrical throughout. Sensory, motor and coordinative functions appear intact. Skin:  Intact without suspicious lesions or rashes Cervical Nodes:  no anterior cervical adenopathy and no posterior cervical adenopathy.   Psych:  Cognition and judgment appear intact. Alert and cooperative with normal attention span and concentration. No apparent delusions, illusions, hallucinations  Diabetes Management Exam:    Foot Exam (with socks and/or shoes not present):       Sensory-Pinprick/Light touch:          Left medial foot (L-4): normal          Left dorsal foot (L-5): normal          Left lateral foot (S-1): normal          Right medial foot (L-4): normal          Right dorsal foot (L-5): normal          Right lateral foot (S-1): normal       Sensory-Monofilament:          Left foot: normal          Right foot: normal       Inspection:          Left foot: normal  Right foot: normal       Nails:          Left foot: normal          Right foot: normal   Impression & Recommendations:  Problem # 1:  VITAMIN D DEFICIENCY (ICD-268.9) Assessment Unchanged  Orders: Venipuncture [id number]) TLB-BMP (Basic Metabolic Panel-BMET) (99991111) TLB-CBC Platelet - w/Differential (85025-CBCD) TLB-Hepatic/Liver Function Pnl (80076-HEPATIC) TLB-TSH (Thyroid Stimulating Hormone) (84443-TSH) TLB-B12 + Folate Pnl NF:[id number]) TLB-IBC Pnl (Iron/FE;Transferrin) (83550-IBC) TLB-A1C / Hgb A1C (Glycohemoglobin) (83036-A1C) T-Vitamin D (25-Hydroxy) [phone])  Problem # 2:  VITAMIN B12 DEFICIENCY (ICD-266.2) Assessment: Unchanged  Orders: Venipuncture [id number]) TLB-BMP (Basic Metabolic Panel-BMET) (99991111) TLB-CBC Platelet - w/Differential (85025-CBCD) TLB-Hepatic/Liver Function Pnl (80076-HEPATIC) TLB-TSH (Thyroid Stimulating Hormone) (84443-TSH) TLB-B12 + Folate Pnl NF:[id number]) TLB-IBC Pnl (Iron/FE;Transferrin) (83550-IBC) TLB-A1C / Hgb A1C (Glycohemoglobin)  (83036-A1C) T-Vitamin D (25-Hydroxy) [id number])  Problem # 3:  ESSENTIAL HYPERTENSION, BENIGN (ICD-401.1) Assessment: Improved  Her updated medication list for this problem includes:    Furosemide 20 Mg Tabs (Furosemide) .Marland Kitchen... Take one tablet once daily    Micardis 20 Mg Tabs (Telmisartan) .Marland Kitchen... 1/2 tab by mouth once daily    Metoprolol Succinate 100 Mg Xr24h-tab (Metoprolol succinate) .Marland Kitchen... Take one tablet by mouth daily  Orders: Venipuncture [id number]) TLB-BMP (Basic Metabolic Panel-BMET) (99991111) TLB-CBC Platelet - w/Differential (85025-CBCD) TLB-Hepatic/Liver Function Pnl (80076-HEPATIC) TLB-TSH (Thyroid Stimulating Hormone) (84443-TSH) TLB-B12 + Folate Pnl NF:[id number]) TLB-IBC Pnl (Iron/FE;Transferrin) (83550-IBC) TLB-A1C / Hgb A1C (Glycohemoglobin) (83036-A1C) T-Vitamin D (25-Hydroxy) [id number])  BP today: 16/64 Prior BP: 130/72 (05/06/2009)  Prior 10 Yr Risk Heart Disease: N/A (10/17/2008)  Labs Reviewed: K+: 4.3 (10/17/2008) Creat: : 0.9 (10/17/2008)   Chol: 227 (10/17/2008)   HDL: 66.00 (10/17/2008)   LDL: 66 (04/17/2007)   TG: 125.0 (10/17/2008)  Problem # 4:  DM (ICD-250.00) Assessment: Unchanged  Her updated medication list for this problem includes:    Actos 45 Mg Tabs (Pioglitazone hcl) ..... Once daily    Aspirin 81 Mg Tbec (Aspirin) ..... Once daily    Metformin Hcl 500 Mg Xr24h-tab (Metformin hcl) .Marland KitchenMarland KitchenMarland KitchenMarland Kitchen 3 tablets at bedtime, 1 tablet in morning    Micardis 20 Mg Tabs (Telmisartan) .Marland Kitchen... 1/2 tab by mouth once daily  Orders: Venipuncture [id number]) TLB-BMP (Basic Metabolic Panel-BMET) (99991111) TLB-CBC Platelet - w/Differential (85025-CBCD) TLB-Hepatic/Liver Function Pnl (80076-HEPATIC) TLB-TSH (Thyroid Stimulating Hormone) (84443-TSH) TLB-B12 + Folate Pnl NF:800672) TLB-IBC Pnl (Iron/FE;Transferrin) (83550-IBC) TLB-A1C / Hgb A1C (Glycohemoglobin) (83036-A1C) T-Vitamin D (25-Hydroxy) [phone])  Labs  Reviewed: Creat: 0.9 (10/17/2008)     Last Eye Exam: normal (11/18/2008) Reviewed HgBA1c results: 6.0 (10/17/2008)  Problem # 5:  ATRIAL FIBRILLATION (ICD-427.31) Assessment: Improved  Her updated medication list for this problem includes:    Aspirin 81 Mg Tbec (Aspirin) ..... Once daily    Warfarin Sodium 4 Mg Tabs (Warfarin sodium) .Marland Kitchen... Take as directed    Warfarin Sodium 2 Mg Tabs (Warfarin sodium) .Marland Kitchen... Take as directed by coumadin clinic.    Metoprolol Succinate 100 Mg Xr24h-tab (Metoprolol succinate) .Marland Kitchen... Take one tablet by mouth daily  Orders: Venipuncture [id number]) TLB-BMP (Basic Metabolic Panel-BMET) (99991111) TLB-CBC Platelet - w/Differential (85025-CBCD) TLB-Hepatic/Liver Function Pnl (80076-HEPATIC) TLB-TSH (Thyroid Stimulating Hormone) (84443-TSH) TLB-B12 + Folate Pnl [id number]) TLB-IBC Pnl (Iron/FE;Transferrin) (83550-IBC) TLB-A1C / Hgb A1C (Glycohemoglobin) (83036-A1C) T-Vitamin D (25-Hydroxy) [id number])  Reviewed the following: PT: 17.3 (10/01/2008)   INR: 2.6 (10/16/2008) Coumadin Dose (weekly): 22 mg (04/21/2009) Prior Coumadin Dose (weekly): 22 mg (04/21/2009) Next Protime: 05/19/2009 (dated on 04/21/2009)  Problem # 6:  UNSPECIFIED IRON DEFICIENCY ANEMIA (ICD-280.9)  Assessment: Unchanged  Her updated medication list for this problem includes:    Cvs Iron 325 (65 Fe) Mg Tabs (Ferrous sulfate) .Marland Kitchen... 1 tablet by mouth as needed    Nascobal 500 Mcg/0.74ml Soln (Cyanocobalamin) ..... One puff in a nostril once a week  Orders: Venipuncture [id number]) TLB-BMP (Basic Metabolic Panel-BMET) (99991111) TLB-CBC Platelet - w/Differential (85025-CBCD) TLB-Hepatic/Liver Function Pnl (80076-HEPATIC) TLB-TSH (Thyroid Stimulating Hormone) (84443-TSH) TLB-B12 + Folate Pnl [id number]) TLB-IBC Pnl (Iron/FE;Transferrin) (83550-IBC) TLB-A1C / Hgb A1C (Glycohemoglobin) (83036-A1C) T-Vitamin D (25-Hydroxy) [phone])  Hgb: 12.2  (10/17/2008)   Hct: 36.3 (10/17/2008)   Platelets: 333.0 (10/17/2008) RBC: 3.99 (10/17/2008)   RDW: 12.9 (10/17/2008)   WBC: 6.5 (10/17/2008) MCV: 91.2 (10/17/2008)   MCHC: 33.6 (10/17/2008) Ferritin: 81.8 (02/07/2008) Iron: 66 (10/17/2008)   % Sat: 15.0 (10/17/2008) B12: 191 (10/17/2008)   Folate: >20.0 ng/mL (10/17/2008)   TSH: 3.58 (10/17/2008)  Complete Medication List: 1)  Actos 45 Mg Tabs (Pioglitazone hcl) .... Once daily 2)  Aspirin 81 Mg Tbec (Aspirin) .... Once daily 3)  Furosemide 20 Mg Tabs (Furosemide) .... Take one tablet once daily 4)  Klor-con M20 20 Meq Tbcr (Potassium chloride crys cr) .... Once daily 5)  Metformin Hcl 500 Mg Xr24h-tab (Metformin hcl) .... 3 tablets at bedtime, 1 tablet in morning 6)  Prilosec Otc 20 Mg Tbec (Omeprazole magnesium) .Marland Kitchen.. 1 once daily 7)  Cvs Iron 325 (65 Fe) Mg Tabs (Ferrous sulfate) .Marland Kitchen.. 1 tablet by mouth as needed 8)  Vitamin D 50000 Unit Caps (Ergocalciferol) .... Every other week 9)  Warfarin Sodium 4 Mg Tabs (Warfarin sodium) .... Take as directed 10)  Warfarin Sodium 2 Mg Tabs (Warfarin sodium) .... Take as directed by coumadin clinic. 11)  Nascobal 500 Mcg/0.49ml Soln (Cyanocobalamin) .... One puff in a nostril once a week 12)  Micardis 20 Mg Tabs (Telmisartan) .... 1/2 tab by mouth once daily 13)  Metoprolol Succinate 100 Mg Xr24h-tab (Metoprolol succinate) .... Take one tablet by mouth daily 14)  Prilosec 20 Mg Cpdr (Omeprazole) .Marland Kitchen.. 1 tab by mouth once daily 15)  Calcium 600 1500 Mg Tabs (Calcium carbonate) .... Tab by mouth once daily  Hypertension Assessment/Plan:      The patient's hypertensive risk group is category C: Target organ damage and/or diabetes.  Today's blood pressure is 16/64.  Her blood pressure goal is < 130/80.  Patient Instructions: 1)  Please schedule a follow-up appointment in 4 months. 2)  It is important that you exercise regularly at least 20 minutes 5 times a week. If you develop chest pain, have severe  difficulty breathing, or feel very tired , stop exercising immediately and seek medical attention. 3)  You need to lose weight. Consider a lower calorie diet and regular exercise.

## 2010-04-09 NOTE — Progress Notes (Signed)
Summary: Test results and medication questions  Phone Note Call from Patient Call back at Home Phone [phone]   Reason for Call: Lab or Test Results Summary of Call: Test results and question about medications Initial call taken by: Delsa Sale,  September 15, 2009 2:34 PM  Follow-up for Phone Call        spoke with pt, she wanted to make sure G[facility] gave the info that dr Stanford Breed needed. she also notices that her resting heart rate is running 50-60's since starting the flecainide. she questioned if she needed to decrease her metoprolol dosage. at present she takes 150mg  once daily. she c/o fatique from this low of a heart rate. will foward for dr Stanford Breed review Fredia Beets, RN  September 15, 2009 5:02 PM   Additional Follow-up for Phone Call Additional follow up Details #1::        Continue present meds for now Colin Mulders, MD, [facility]  September 15, 2009 5:18 PM  pt aware Fredia Beets, RN  September 15, 2009 5:37 PM

## 2010-04-09 NOTE — Assessment & Plan Note (Signed)
Summary: 4 month follow up-lb   Vital Signs:  Patient profile:   70 year old female Menstrual status:  postmenopausal Height:      68 inches Weight:      188.50 pounds BMI:     28.76 O2 Sat:      97 % on Room air Temp:     98.1 degrees F oral Pulse rate:   59 / minute Pulse rhythm:   regular Resp:     16 per minute BP sitting:   122 / 78  (left arm) Cuff size:   large  Vitals Entered By: Estell Harpin CMA (January 26, 2010 8:07 AM)  Nutrition Counseling: Patient's BMI is greater than 25 and therefore counseled on weight management options.  O2 Flow:  Room air CC: follow-up visit, Preventive Care Is Patient Diabetic? Yes Did you bring your meter with you today? No Pain Assessment Patient in pain? no          Menstrual Status postmenopausal Last PAP Result Normal   Primary Care Provider:  Janith Lima MD  CC:  follow-up visit and Preventive Care.  History of Present Illness:  Follow-Up Visit      This is a 70 year old woman who presents for Follow-up visit.  The patient denies chest pain, palpitations, dizziness, syncope, low blood sugar symptoms, high blood sugar symptoms, edema, SOB, PND, and orthopnea.  Since the last visit the patient notes no new problems or concerns.  The patient reports taking meds as prescribed, monitoring BP, monitoring blood sugars, and dietary noncompliance.  When questioned about possible medication side effects, the patient notes none.    Preventive Screening-Counseling & Management  Alcohol-Tobacco     Alcohol drinks/day: 0     Alcohol Counseling: not indicated; patient does not drink     Smoking Status: quit     Tobacco Counseling: to remain off tobacco products  Hep-HIV-STD-Contraception     Hepatitis Risk: no risk noted     HIV Risk: no risk noted     STD Risk: no risk noted      Sexual History:  currently monogamous.        Drug Use:  never.        Blood Transfusions:  no.    Clinical Review Panels:  Prevention   Last Mammogram:  Normal Bilateral (02/17/2009)   Last Pap Smear:  Normal (11/18/2008)   Last Colonoscopy:  Results: Diverticulosis.       Location:  [place].  (07/07/2007)  Immunizations   Last Tetanus Booster:  Tdap (01/26/2010)   Last Flu Vaccine:  Fluvax MCR (01/26/2010)   Last Pneumovax:  Pneumovax (01/03/2009)  Lipid Management   Cholesterol:  197 (09/24/2009)   LDL (bad choesterol):  116 (09/24/2009)   HDL (good cholesterol):  57.70 (09/24/2009)  Diabetes Management   HgBA1C:  6.1 (09/24/2009)   Creatinine:  0.8 (09/24/2009)   Last Dilated Eye Exam:  normal (11/18/2008)   Last Foot Exam:  yes (01/26/2010)   Last Flu Vaccine:  Fluvax MCR (01/26/2010)   Last Pneumovax:  Pneumovax (01/03/2009)  CBC   WBC:  7.4 (05/15/2009)   RBC:  3.85 (05/15/2009)   Hgb:  11.6 (05/15/2009)   Hct:  34.8 (05/15/2009)   Platelets:  427.0 (05/15/2009)   MCV  90.4 (05/15/2009)   MCHC  33.2 (05/15/2009)   RDW  12.5 (05/15/2009)   PMN:  81.1 (05/15/2009)   Lymphs:  12.4 (05/15/2009)   Monos:  4.1 (05/15/2009)  Eosinophils:  2.2 (05/15/2009)   Basophil:  0.2 (05/15/2009)  Complete Metabolic Panel   Glucose:  114 (09/24/2009)   Sodium:  140 (09/24/2009)   Potassium:  4.9 (09/24/2009)   Chloride:  106 (09/24/2009)   CO2:  30 (09/24/2009)   BUN:  12 (09/24/2009)   Creatinine:  0.8 (09/24/2009)   Albumin:  4.0 (09/24/2009)   Total Protein:  6.8 (09/24/2009)   Calcium:  8.9 (09/24/2009)   Total Bili:  0.4 (09/24/2009)   Alk Phos:  76 (09/24/2009)   SGPT (ALT):  13 (09/24/2009)   SGOT (AST):  18 (09/24/2009)   Medications Prior to Update: 1)  Actos 45 Mg  Tabs (Pioglitazone Hcl) .... Once Daily 2)  Aspirin 81 Mg  Tbec (Aspirin) .... Once Daily 3)  Furosemide 20 Mg  Tabs (Furosemide) .... Take One Tablet Once Daily 4)  Klor-Con M20 20 Meq  Tbcr (Potassium Chloride Crys Cr) .... Once Daily 5)  Metformin Hcl 500 Mg Xr24h-Tab (Metformin Hcl) .... 3 Tablets At Bedtime,  1 Tablet in Morning 6)  Prilosec Otc 20 Mg  Tbec (Omeprazole Magnesium) .Marland Kitchen.. 1 Once Daily 7)  Vitamin D 50000 Unit Caps (Ergocalciferol) .... Every Monday 8)  Warfarin Sodium 4 Mg Tabs (Warfarin Sodium) .... Take As Directed 9)  Warfarin Sodium 2 Mg Tabs (Warfarin Sodium) .... Take As Directed By Coumadin Clinic. 10)  Vitamin B-12 2500 Mcg Subl (Cyanocobalamin) .... Once Daily 11)  Micardis 20 Mg Tabs (Telmisartan) .... 1/2 Tab By Mouth Once Daily 12)  Metoprolol Succinate 100 Mg Xr24h-Tab (Metoprolol Succinate) .... Take 1 Tablet By Mouth Daily...with A 50mg  Tab To Equal 150mg  Daily 13)  Calcium 600 1500 Mg Tabs (Calcium Carbonate) .... Tab By Mouth Once Daily 14)  Flecainide Acetate 100 Mg Tabs (Flecainide Acetate) .... Take One Tablet By Mouth Every 12 Hours  Current Medications (verified): 1)  Actos 45 Mg  Tabs (Pioglitazone Hcl) .... Once Daily 2)  Aspirin 81 Mg  Tbec (Aspirin) .... Once Daily 3)  Furosemide 20 Mg  Tabs (Furosemide) .... Take One Tablet Once Daily 4)  Klor-Con M20 20 Meq  Tbcr (Potassium Chloride Crys Cr) .... Once Daily 5)  Metformin Hcl 500 Mg Xr24h-Tab (Metformin Hcl) .... 3 Tablets At Bedtime, 1 Tablet in Morning 6)  Prilosec Otc 20 Mg  Tbec (Omeprazole Magnesium) .Marland Kitchen.. 1 Once Daily 7)  Vitamin D 50000 Unit Caps (Ergocalciferol) .... Every Monday 8)  Warfarin Sodium 4 Mg Tabs (Warfarin Sodium) .... Take As Directed 9)  Warfarin Sodium 2 Mg Tabs (Warfarin Sodium) .... Take As Directed By Coumadin Clinic. 10)  Vitamin B-12 2500 Mcg Subl (Cyanocobalamin) .... Once Daily 11)  Micardis 20 Mg Tabs (Telmisartan) .... 1/2 Tab By Mouth Once Daily 12)  Metoprolol Succinate 100 Mg Xr24h-Tab (Metoprolol Succinate) .... Take 1 Tablet By Mouth Daily...with A 50mg  Tab To Equal 150mg  Daily 13)  Calcium 600 1500 Mg Tabs (Calcium Carbonate) .... Tab By Mouth Once Daily 14)  Flecainide Acetate 100 Mg Tabs (Flecainide Acetate) .... 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 Redo Cabg 4/10  due to occluded LM at implantation site (LIMA to LAD, SVG to OM) FOOT SURGERY to remove neuroma  Family History: Last updated: 08/09/2007 Family History of Colon Cancer: grandmother, uncle Family History of Breast Cancer: aunt, cousin Family History of Diabetes: uncle, grandmother,uncle  Social History: Last updated: 01/25/2008 Patient is a former smoker.  quit 1985 Alcohol Use - no Illicit Drug Use - no Patient does not get regular exercise.  Married   Risk Factors: Alcohol Use: 0 (01/26/2010) Exercise: no (08/09/2007)  Risk Factors: Smoking Status: quit (01/26/2010)  Family History: Reviewed history from 08/09/2007 and no changes required. Family History of Colon Cancer: grandmother, uncle Family History of Breast Cancer: aunt, cousin Family History of Diabetes: uncle, grandmother,uncle  Social History: Reviewed history from 01/25/2008 and no changes required. Patient is a former smoker.  quit 1985 Alcohol Use - no Illicit Drug Use - no Patient does not get regular exercise.  Married   Review of Systems       The patient complains of weight gain.  The patient denies anorexia, fever, weight loss, chest pain, syncope, dyspnea on exertion, peripheral edema, prolonged cough, headaches, hemoptysis, abdominal pain, hematuria, suspicious skin lesions, difficulty walking, and depression.   Endo:  Denies cold intolerance, excessive hunger, excessive thirst, excessive urination, heat  intolerance, polyuria, and weight change. Heme:  Denies abnormal bruising, bleeding, enlarge lymph nodes, fevers, pallor, and skin discoloration.  Physical Exam  General:  well-developed well-nourished in no acute distress. Skin is warm and dry.overweight-appearing.   Head:  normocephalic and atraumatic.   Mouth:  Oral mucosa and oropharynx without lesions or exudates.  Teeth in good repair. Neck:  supple, full ROM, no masses, normal carotid upstroke, no carotid bruits, and no cervical lymphadenopathy.   Lungs:  Normal respiratory effort, chest expands symmetrically. Lungs are clear to auscultation, no crackles or wheezes. Heart:  regular rate and rhythm with [id number] systolic murmur left sternal border and right 2nd ICS with radiation to carotids. No diastolic murmur noted. no gallop, no rub, and no JVD.   Abdomen:  Bowel sounds positive,abdomen soft and non-tender without masses, organomegaly or hernias noted.  Msk:  normal ROM, no joint tenderness, no joint swelling, and no joint warmth.   Pulses:  R and L carotid,radial,femoral,dorsalis pedis and posterior tibial pulses are full and equal bilaterally Extremities:  No clubbing, cyanosis, edema, or deformity noted with normal full range of motion of all joints.   Neurologic:  No cranial nerve deficits noted. Station and gait are normal. Plantar reflexes are down-going bilaterally. DTRs are symmetrical throughout. Sensory, motor and coordinative functions appear intact. Skin:  turgor normal, color normal, no rashes, no suspicious lesions, no ecchymoses, no petechiae, no purpura, no ulcerations, and no edema.   Cervical Nodes:  no anterior cervical adenopathy and no posterior cervical adenopathy.   Psych:  Cognition and judgment appear intact. Alert and cooperative with normal attention span and concentration. No apparent delusions, illusions, hallucinations  Diabetes Management Exam:    Foot Exam (with socks and/or shoes not present):        Sensory-Pinprick/Light touch:          Left medial foot (L-4): normal          Left dorsal foot (L-5): normal          Left lateral foot (S-1): normal          Right medial foot (L-4): normal          Right dorsal foot (L-5): normal  Right lateral foot (S-1): normal       Sensory-Monofilament:          Left foot: normal          Right foot: normal       Inspection:          Left foot: normal          Right foot: normal       Nails:          Left foot: normal          Right foot: normal   Impression & Recommendations:  Problem # 1:  VITAMIN D DEFICIENCY (ICD-268.9) Assessment Unchanged  Orders: T-Vitamin D (25-Hydroxy) [id number])  Problem # 2:  VITAMIN B12 DEFICIENCY (ICD-266.2) Assessment: Unchanged  Orders: Fingerstick [id number]) TLB-BMP (Basic Metabolic Panel-BMET) (99991111) TLB-B12 + Folate Pnl [id number]) TLB-CBC Platelet - w/Differential (85025-CBCD) TLB-Hepatic/Liver Function Pnl (80076-HEPATIC) TLB-TSH (Thyroid Stimulating Hormone) (84443-TSH) TLB-A1C / Hgb A1C (Glycohemoglobin) (83036-A1C) TLB-Udip w/ Micro (81001-URINE) TLB-Electrolyte Panel (NA/K/CL/CO2) (80051-LYTES)  Problem # 3:  ESSENTIAL HYPERTENSION, BENIGN (ICD-401.1) Assessment: Improved  Her updated medication list for this problem includes:    Furosemide 20 Mg Tabs (Furosemide) .Marland Kitchen... Take one tablet once daily    Micardis 20 Mg Tabs (Telmisartan) .Marland Kitchen... 1/2 tab by mouth once daily    Metoprolol Succinate 100 Mg Xr24h-tab (Metoprolol succinate) .Marland Kitchen... Take 1 tablet by mouth daily...with a 50mg  tab to equal 150mg  daily  Orders: Fingerstick [id number]) TLB-BMP (Basic Metabolic Panel-BMET) (99991111) TLB-B12 + Folate Pnl [id number]) TLB-CBC Platelet - w/Differential (85025-CBCD) TLB-Hepatic/Liver Function Pnl (80076-HEPATIC) TLB-TSH (Thyroid Stimulating Hormone) (84443-TSH) TLB-A1C / Hgb A1C (Glycohemoglobin) (83036-A1C) TLB-Udip w/ Micro  (81001-URINE) TLB-Electrolyte Panel (NA/K/CL/CO2) (80051-LYTES)  BP today: 122/78 Prior BP: 137/67 (01/12/2010)  Prior 10 Yr Risk Heart Disease: N/A (10/17/2008)  Labs Reviewed: K+: 4.9 (09/24/2009) Creat: : 0.8 (09/24/2009)   Chol: 197 (09/24/2009)   HDL: 57.70 (09/24/2009)   LDL: 116 (09/24/2009)   TG: 116.0 (09/24/2009)  Problem # 4:  DM (ICD-250.00) Assessment: Unchanged  Her updated medication list for this problem includes:    Actos 45 Mg Tabs (Pioglitazone hcl) ..... Once daily    Aspirin 81 Mg Tbec (Aspirin) ..... Once daily    Metformin Hcl 500 Mg Xr24h-tab (Metformin hcl) .Marland KitchenMarland KitchenMarland KitchenMarland Kitchen 3 tablets at bedtime, 1 tablet in morning    Micardis 20 Mg Tabs (Telmisartan) .Marland Kitchen... 1/2 tab by mouth once daily  Orders: Ophthalmology Referral (Ophthalmology)  Labs Reviewed: Creat: 0.8 (09/24/2009)     Last Eye Exam: normal (11/18/2008) Reviewed HgBA1c results: 6.1 (09/24/2009)  6.1 (05/15/2009)  Complete Medication List: 1)  Actos 45 Mg Tabs (Pioglitazone hcl) .... Once daily 2)  Aspirin 81 Mg Tbec (Aspirin) .... Once daily 3)  Furosemide 20 Mg Tabs (Furosemide) .... Take one tablet once daily 4)  Klor-con M20 20 Meq Tbcr (Potassium chloride crys cr) .... Once daily 5)  Metformin Hcl 500 Mg Xr24h-tab (Metformin hcl) .... 3 tablets at bedtime, 1 tablet in morning 6)  Prilosec Otc 20 Mg Tbec (Omeprazole magnesium) .Marland Kitchen.. 1 once daily 7)  Vitamin D 50000 Unit Caps (Ergocalciferol) .... Every monday 8)  Warfarin Sodium 4 Mg Tabs (Warfarin sodium) .... Take as directed 9)  Warfarin Sodium 2 Mg Tabs (Warfarin sodium) .... Take as directed by coumadin clinic. 10)  Vitamin B-12 2500 Mcg Subl (Cyanocobalamin) .... Once daily 11)  Micardis 20 Mg Tabs (Telmisartan) .... 1/2 tab by mouth once daily 12)  Metoprolol Succinate 100 Mg Xr24h-tab (Metoprolol succinate) .... Take 1 tablet  by mouth daily...with a 50mg  tab to equal 150mg  daily 13)  Calcium 600 1500 Mg Tabs (Calcium carbonate) .... Tab by  mouth once daily 14)  Flecainide Acetate 100 Mg Tabs (Flecainide acetate) .... Take one tablet by mouth every 12 hours  Other Orders: Tdap => 37yrs IM [id number]) Flu Vaccine 25yrs + MEDICARE PATIENTS [id number]) Administration Flu vaccine - MCR (G0008)  PAP Screening:    Hx Cervical Dysplasia in last 5 yrs? No    3 normal PAP smears in last 5 yrs? Yes    Last PAP smear:  11/18/2008    Reviewed PAP smear recommendations:  patient defers to GYN provider  Mammogram Screening:    Last Mammogram:  02/17/2009    Reviewed Mammogram recommendations:  mammogram ordered  Osteoporosis Risk Assessment:  Risk Factors for Fracture or Low Bone Density:   Race (White or Asian):     yes   Hx of Fractures:       no   FH of Osteoporosis:     yes   Hx of falls:       no   Physically inactive:     no   Smoking status:       quit   High alcohol use:     no   Low calcium/Vit. D intake:   yes   Corticosteroid use:     no   Heparin use:       no   Thyroid disease:     no   Rheumatoid Arthritis:     no  Immunization & Chemoprophylaxis:    Tetanus vaccine: Tdap  (01/26/2010)    Influenza vaccine: Fluvax MCR  (01/26/2010)    Pneumovax: Pneumovax  (01/03/2009)  Patient Instructions: 1)  Please schedule a follow-up appointment in 4 months. 2)  It is important that you exercise regularly at least 20 minutes 5 times a week. If you develop chest pain, have severe difficulty breathing, or feel very tired , stop exercising immediately and seek medical attention. 3)  You need to lose weight. Consider a lower calorie diet and regular exercise.  4)  Schedule your mammogram. 5)  You need to have a Pap Smear to prevent cervical cancer. 6)  Check your blood sugars regularly. If your readings are usually above 200 or below 70 you should contact our office. 7)  It is important that your Diabetic A1c level is checked every 3 months. 8)  See your eye doctor yearly to check for diabetic eye damage. 9)  Check your feet each  night for sore areas, calluses or signs of infection. 10)  Check your Blood Pressure regularly. If it is above 130/80: you should make an appointment.   Orders Added: 1)  Fingerstick [36416] 2)  TLB-BMP (Basic Metabolic Panel-BMET) 123456 3)  TLB-B12 + Folate Pnl [82746_82607-B12/FOL] 4)  TLB-CBC Platelet - w/Differential [85025-CBCD] 5)  TLB-Hepatic/Liver Function Pnl [80076-HEPATIC] 6)  TLB-TSH (Thyroid Stimulating Hormone) [84443-TSH] 7)  TLB-A1C / Hgb A1C (Glycohemoglobin) [83036-A1C] 8)  TLB-Udip w/ Micro [81001-URINE] 9)  TLB-Electrolyte Panel (NA/K/CL/CO2) [80051-LYTES] 10)  T-Vitamin D (25-Hydroxy) [id number] 11)  Tdap => 89yrs IM [90715] 12)  Flu Vaccine 26yrs + MEDICARE PATIENTS [Q2039] 13)  Administration Flu vaccine - MCR [id number] 14)  Ophthalmology Referral [Ophthalmology] 15)  Est. Patient Level IV [id number]   Immunizations Administered:  Influenza Vaccine # 1:    Vaccine Type: Fluvax MCR    Site: right deltoid    Mfr: Sanofi Pasteur    Dose: 0.5 ml  Route: IM    Given by: Estell Harpin CMA    Exp. Date: 09/05/2010    Lot #: [id number]    VIS given: 09/30/09 version given January 26, 2010.  Tetanus Vaccine:    Vaccine Type: Tdap    Site: left deltoid    Mfr: GlaxoSmithKline    Dose: 0.5 ml    Route: IM    Given by: Estell Harpin CMA    Exp. Date: 12/26/2011    Lot #: [id number]    VIS given: 01/24/08 version given January 26, 2010.   Immunizations Administered:  Influenza Vaccine # 1:    Vaccine Type: Fluvax MCR    Site: right deltoid    Mfr: [place]    Dose: 0.5 ml    Route: IM    Given by: Estell Harpin CMA    Exp. Date: 09/05/2010    Lot #: [id number]    VIS given: 09/30/09 version given January 26, 2010.  Tetanus Vaccine:    Vaccine Type: Tdap    Site: left deltoid    Mfr: GlaxoSmithKline    Dose: 0.5 ml    Route: IM    Given by: Estell Harpin CMA    Exp. Date: 12/26/2011    Lot #: [id number]    VIS given: 01/24/08  version given January 26, 2010.   Prevention & Chronic Care Immunizations   Influenza vaccine: Fluvax MCR  (01/26/2010)    Tetanus booster: 01/26/2010: Tdap    Pneumococcal vaccine: Pneumovax  (01/03/2009)    H. zoster vaccine: Not documented   H. zoster vaccine deferral: Refused  (01/26/2010)  Colorectal Screening   Hemoccult: Not documented   Hemoccult action/deferral: patient refused  (09/24/2009)    Colonoscopy: Results: Diverticulosis.       Location:  [place].    (07/07/2007)   Colonoscopy action/deferral: patient refused  (09/24/2009)  Other Screening   Pap smear: Normal  (11/18/2008)   Pap smear action/deferral: patient defers to GYN provider  (01/26/2010)    Mammogram: Normal Bilateral  (02/17/2009)   Mammogram action/deferral: mammogram ordered  (01/26/2010)    DXA bone density scan: Borderline Osteoporosis  (11/15/2006)   Smoking status: quit  (01/26/2010)  Diabetes Mellitus   HgbA1C: 6.1  (09/24/2009)    Eye exam: normal  (11/18/2008)   Eye exam due: 11/2009    Foot exam: yes  (01/26/2010)   High risk foot: Not documented   Foot care education: Not documented    Urine microalbumin/creatinine ratio: 4.6  (10/17/2008)  Lipids   Total Cholesterol: 197  (09/24/2009)   LDL: 116  (09/24/2009)   LDL Direct: 132.3  (10/17/2008)   HDL: 57.70  (09/24/2009)   Triglycerides: 116.0  (09/24/2009)    SGOT (AST): 18  (09/24/2009)   SGPT (ALT): 13  (09/24/2009)   Alkaline phosphatase: 76  (09/24/2009)   Total bilirubin: 0.4  (09/24/2009)  Hypertension   Last Blood Pressure: 122 / 78  (01/26/2010)   Serum creatinine: 0.8  (09/24/2009)   Serum potassium 4.9  (09/24/2009)  Self-Management Support :    Diabetes self-management support: Not documented    Hypertension self-management support: Not documented    Lipid self-management support: Not documented

## 2010-04-09 NOTE — Letter (Signed)
Summary: Results Follow-up Letter  [facility]   [place], [place] [address]   Phone: [phone]  Fax: [phone]    09/24/2009  [place],   [address]  Dear Ms. Seeman,   The following are the results of your recent test(s):  Test     Result     Blood sugars   normal Thyroid     normal Liver/kidney   normal   _________________________________________________________  Please call for an appointment as directed _________________________________________________________ _________________________________________________________ _________________________________________________________  Sincerely,  Scarlette Calico MD [facility]

## 2010-04-09 NOTE — Medication Information (Signed)
Summary: rov/sp  Anticoagulant Therapy  Managed by: Porfirio Oar, PharmD Referring MD: [name] MD PCP: Janith Lima MD Supervising MD: Harrington Challenger MD, Nevin Bloodgood Indication 1: Aortic Valve Disorder (ICD-424.1) Lab Used: [facility] Site: [facility] INR POC 2.8 INR RANGE 2 - 3  Dietary changes: no    Health status changes: no    Bleeding/hemorrhagic complications: no    Recent/future hospitalizations: no    Any changes in medication regimen? no    Recent/future dental: no  Any missed doses?: no       Is patient compliant with meds? yes       Allergies: No Known Drug Allergies  Anticoagulation Management History:      The patient is taking warfarin and comes in today for a routine follow up visit.  Positive risk factors for bleeding include an age of 65 years or older and presence of serious comorbidities.  Negative risk factors for bleeding include no history of CVA/TIA.  The bleeding index is 'intermediate risk'.  Positive CHADS2 values include History of HTN and History of Diabetes.  Negative CHADS2 values include Age > 70 years old and Prior Stroke/CVA/TIA.  The start date was 07/11/2006.  Her last INR was 3.6.  Anticoagulation responsible provider: Harrington Challenger MD, Nevin Bloodgood.  INR POC: 2.8.  Exp: 01/2011.    Anticoagulation Management Assessment/Plan:      The patient's current anticoagulation dose is Warfarin sodium 4 mg tabs: Take as directed, Warfarin sodium 2 mg tabs: Take as directed by coumadin clinic..  The target INR is 2.0-3.0.  The next INR is due 03/16/2010.  Anticoagulation instructions were given to patient.  Results were reviewed/authorized by Porfirio Oar, PharmD.  She was notified by Porfirio Oar PharmD.         Prior Anticoagulation Instructions: INR 2.7  Continue same dose of 4mg  every day except 2mg  on Monady and Friday.  Recheck INR in 4 weeks.   Current Anticoagulation Instructions: INR 2.8  Continue same dose of 4mg  daily except 2mg  on Monday and Friday.  Recheck  INR in 4 weeks.

## 2010-04-09 NOTE — Assessment & Plan Note (Signed)
Summary: [name]   Primary Provider:  Janith Lima MD  CC:  follow yp.  History of Present Illness: Pleasant female with history of severe AS, mitral regurgitation, aneurysm of the ascending thoracic aorta (status post aortic valve replacement, mitral valve repair, resection and grafting of the ascending  thoracic aortic aneurysm on Jul 07, 2006). History of paroxysmal atrial fibrillation (status post maze  procedure on Jul 07, 2006). Followup catheterization performed secondary to elevated troponin and recurrent atrial fibrillation  revealed an ejection fraction of 25-30% and occlusion of the reimplanted left main.  The patient subsequently underwent redo coronary artery bypass and graft with a LIMA to the LAD and a saphenous vein graft to the OM. She also had an epicardial lead placed if she required biventricular pacing in the future. A followup echocardiogram was performed in March of 2011.  Her LV function was normal. The prosthetic aortic valve was functioning appropriately with only mild aortic insufficiency. There was mild mitral stenosis  following mitral valve repair. There was mild right atrial and right ventricular enlargement.  I last saw her in August of 2011. She was readmitted in August of 2011with atrial flutter and underwent cardioversion. Her flecainide was increased to 100 mg p.o. b.i.d. A followup exercise treadmill showed no exercise induced ventricular tachycardia. She was seen by Dr. Rayann Heman. He recommended continuing management with same medications. If atrial fibrillation recurred then we could consider ablation versus tikosyn. Since then, the patient denies any dyspnea on exertion, orthopnea, PND, pedal edema, palpitations, syncope or chest pain.   Current Medications (verified): 1)  Actos 45 Mg  Tabs (Pioglitazone Hcl) .... Once Daily Karen)  Aspirin 81 Mg  Tbec (Aspirin) .... Once Daily 3)  Furosemide 20 Mg  Tabs (Furosemide) .... Take One Tablet Once Daily 4)   Klor-Con M20 20 Meq  Tbcr (Potassium Chloride Crys Cr) .... Once Daily 5)  Metformin Hcl 500 Mg Xr24h-Tab (Metformin Hcl) .... 3 Tablets At Bedtime, 1 Tablet in Morning 6)  Prilosec Otc 20 Mg  Tbec (Omeprazole Magnesium) .Marland Kitchen.. 1 Once Daily 7)  Vitamin D 50000 Unit Caps (Ergocalciferol) .... Every Monday 8)  Warfarin Sodium 4 Mg Tabs (Warfarin Sodium) .... Take As Directed 9)  Warfarin Sodium Karen Mg Tabs (Warfarin Sodium) .... Take As Directed By Coumadin Clinic. 10)  Vitamin B-12 2500 Mcg Subl (Cyanocobalamin) .... Once Daily 11)  Micardis 20 Mg Tabs (Telmisartan) .... 1/Karen Tab By Mouth Once Daily 12)  Metoprolol Succinate 100 Mg Xr24h-Tab (Metoprolol Succinate) .... Take 1 Tablet By Mouth Daily...with A 50mg  Tab To Equal 150mg  Daily 13)  Calcium 600 1500 Mg Tabs (Calcium Carbonate) .... Tab By Mouth Once Daily 14)  Flecainide Acetate 100 Mg Tabs (Flecainide Acetate) .... Take One Tablet By Mouth Every 12 Hours  Allergies: No Known Drug Allergies  Past History:  Past Medical History: Reviewed history from 12/08/2009 and no changes required. 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 Homograft aortic root replacement, MV repair due to severe MR,   resection and grafting of ascending thoracic aortic aneurysm and left sided maze in 2008. Redo Cabg 4/10 due to occluded LM at implantation site (LIMA to LAD, SVG to OM) FOOT SURGERY to remove neuroma  Social History: Reviewed history from 01/25/2008 and no changes required. Patient is a former smoker.  quit 1985 Alcohol Use -  no Illicit Drug Use - no Patient does not get regular exercise.  Married   Review of Systems       no fevers or chills, productive cough, hemoptysis, dysphasia, odynophagia, melena, hematochezia, dysuria,  hematuria, rash, seizure activity, orthopnea, PND, pedal edema, claudication. Remaining systems are negative.   Vital Signs:  Patient profile:   70 year old female Height:      68 inches Weight:      190 pounds BMI:     28.99 Pulse rate:   58 / minute Resp:     14 per minute BP sitting:   137 / 67  (left arm)  Vitals Entered By: Burnett Kanaris (January 12, 2010 9:25 AM)  Physical Exam  General:  Well-developed well-nourished in no acute distress.  Skin is warm and dry.  HEENT is normal.  Neck is supple. No thyromegaly.  Chest is clear to auscultation with normal expansion.  Cardiovascular exam is regular rate and rhythm. Karen/6 systolic murmur left sternal border radiating to the carotids. Abdominal exam nontender or distended. No masses palpated. Extremities show no edema. neuro grossly intact    EKG  Procedure date:  01/12/2010  Findings:      Sinus bradycardia at a rate of 58. Left ventricular hypertrophy. Left axis deviation. Cannot rule out prior septal infarct. First degree AV block.   Impression & Recommendations:  Problem # 1:  MITRAL VALVE REPLACEMENT, HX OF (ICD-V15.1) Continued SBE prophylaxis. Followup echocardiogram when she returns in 6 months.  Problem # Karen:  ESSENTIAL HYPERTENSION, BENIGN (ICD-401.1) Blood pressure controlled. The following medications were removed from the medication list:    Metoprolol Succinate 50 Mg Xr24h-tab (Metoprolol succinate) .Marland Kitchen... 1 once daily Her updated medication list for this problem includes:    Aspirin 81 Mg Tbec (Aspirin) ..... Once daily    Furosemide 20 Mg Tabs (Furosemide) .Marland Kitchen... Take one tablet once daily    Micardis 20 Mg Tabs (Telmisartan) .Marland Kitchen... 1/Karen tab by mouth once daily    Metoprolol Succinate 100 Mg Xr24h-tab (Metoprolol succinate) .Marland Kitchen... Take 1 tablet by mouth daily...with a 50mg  tab to equal 150mg  daily  Problem # 3:  AORTIC VALVE REPLACEMENT, HX OF (ICD-V43.3) Continued SBE prophylaxis. Followup  echocardiogram in 6 months when she returns.  Problem # 4:  COUMADIN THERAPY (ICD-V58.61) Goal INR Karen-3. Monitored in the Coumadin clinic.  Problem # 5:  HYPERLIPIDEMIA (B2193296.4) Management per primary care. She does not want to take statins.  Problem # 6:  DM (ICD-250.00)  Her updated medication list for this problem includes:    Actos 45 Mg Tabs (Pioglitazone hcl) ..... Once daily    Aspirin 81 Mg Tbec (Aspirin) ..... Once daily    Metformin Hcl 500 Mg Xr24h-tab (Metformin hcl) .Marland KitchenMarland KitchenMarland KitchenMarland Kitchen 3 tablets at bedtime, 1 tablet in morning    Micardis 20 Mg Tabs (Telmisartan) .Marland Kitchen... 1/Karen tab by mouth once daily  Problem # 7:  ATRIAL FIBRILLATION (ICD-427.31) She remains in sinus rhythm. Continue beta blocker, flecainide and Coumadin. We will consider ablation versus tikosyn if atrial fibrillation recurs. The following medications were removed from the medication list:    Metoprolol Succinate 50 Mg Xr24h-tab (Metoprolol succinate) .Marland Kitchen... 1 once daily Her updated medication list for this problem includes:    Aspirin 81 Mg Tbec (Aspirin) ..... Once daily    Warfarin Sodium 4 Mg Tabs (Warfarin sodium) .Marland Kitchen... Take as directed    Warfarin Sodium Karen Mg Tabs (Warfarin sodium) .Marland Kitchen... Take as directed by coumadin clinic.  Metoprolol Succinate 100 Mg Xr24h-tab (Metoprolol succinate) .Marland Kitchen... Take 1 tablet by mouth daily...with a 50mg  tab to equal 150mg  daily    Flecainide Acetate 100 Mg Tabs (Flecainide acetate) .Marland Kitchen... Take one tablet by mouth every 12 hours  Patient Instructions: 1)  Your physician recommends that you schedule a follow-up appointment in: 6 MONTHS

## 2010-04-09 NOTE — Progress Notes (Signed)
Summary: lab results  Phone Note Call from Patient   Caller: Patient Call For: Janith Lima MD Summary of Call: PT  would like to know the results of her blood work  Initial call taken by: Nonah Mattes,  January 27, 2010 12:53 PM  Follow-up for Phone Call        they look good, I will send her a letter and copy, thanks Follow-up by: Janith Lima MD,  January 28, 2010 7:39 AM  Additional Follow-up for Phone Call Additional follow up Details #1::        Patient notified per MD on labs. She would like to know if she should go back to taking vit D every other week and also what he recommed about her b12 dosage based off of the lab values.Marland KitchenMarland KitchenEllison Hughs Archie CMA  January 28, 2010 12:52 PM  Additional Follow-up by: Janith Lima MD,  January 28, 2010 12:58 PM    Additional Follow-up for Phone Call Additional follow up Details #2::    stay on the current doses of everything Follow-up by: Janith Lima MD,  January 28, 2010 12:58 PM  Additional Follow-up for Phone Call Additional follow up Details #3:: Details for Additional Follow-up Action Taken: Pt notiified of MD instructions.  Additional Follow-up by: Geanie Kenning LPN,  November 25, [id number] 8:46 AM

## 2010-04-09 NOTE — Progress Notes (Signed)
Summary: afib  Phone Note Call from Patient Call back at [phone]   Caller: Patient Reason for Call: Talk to Nurse Summary of Call: pt has in afib yesterday, stayed overnight in the hospital([facility] [place]), took sinus meds equate brand suphedrine; believes that is what caused it, does she need to come in and be seen? Initial call taken by: Darnell Level,  May 02, 2009 3:16 PM  Follow-up for Phone Call        spoke with pt, she had taken some sinus meds and then had to go to the hosp out of town due to atrial fib. she was given cardizem and digoxin and then finally converted. she is returning home and wants to discuss with dr Stanford Breed. follow up appt made Fredia Beets, RN  May 02, 2009 4:21 PM

## 2010-04-09 NOTE — Progress Notes (Signed)
Summary: refill  Phone Note Refill Request Message from:  Patient on August 27, 2009 9:42 AM  Refills Requested: Medication #1:  KLOR-CON M20 20 MEQ  TBCR once daily   Supply Requested: 3 months [facility] on [address]   Method Requested: Fax to [place] Initial call taken by: Darnell Level,  August 27, 2009 9:42 AM    Prescriptions: KLOR-CON M20 20 MEQ  TBCR (POTASSIUM CHLORIDE CRYS CR) once daily  #90 x 3   Entered by:   Burnett Kanaris   Authorized by:   Colin Mulders, MD, [facility]   Signed by:   Burnett Kanaris on 08/27/2009   Method used:   Electronically to        [id number] (retail)       [address]       [place], [place]  [address]       Ph: [phone] or [phone]       Fax: [phone]   RxID:   [id number]

## 2010-04-09 NOTE — Assessment & Plan Note (Signed)
Summary: burning/?uti/cd   Vital Signs:  Patient profile:   70 year old female Menstrual status:  postmenopausal Height:      68 inches Weight:      188.50 pounds BMI:     28.76 O2 Sat:      95 % on Room air Temp:     98.0 degrees F oral Pulse rate:   58 / minute Pulse rhythm:   regular Resp:     16 per minute BP sitting:   128 / 72  (left arm) Cuff size:   large  Vitals Entered By: [place] (March 27, 2010 10:21 AM)  Nutrition Counseling: Patient's BMI is greater than 25 and therefore counseled on weight management options.  O2 Flow:  Room air CC: patient c/o urinary freq// no burning or pain, , Dysuria Is Patient Diabetic? Yes Did you bring your meter with you today? No Pain Assessment Patient in pain? no       Does patient need assistance? Functional Status Self care Ambulation Normal   Primary Care Provider:  Janith Lima MD  CC:  patient c/o urinary freq// no burning or pain, , and Dysuria.  History of Present Illness:  Dysuria      This is a 70 year old woman who presents with Dysuria.  The symptoms began 2 days ago.  The intensity is described as mild.  The patient reports urinary frequency and urgency, but denies burning with urination, hematuria, vaginal discharge, vaginal itching, and vaginal sores.  The patient denies the following associated symptoms: nausea, vomiting, fever, shaking chills, flank pain, abdominal pain, back pain, pelvic pain, and arthralgias.  Risk factors for urinary tract infection include diabetes, prior antibiotics, and history of GU anomaly.  History is significant for recent UTI.    Preventive Screening-Counseling & Management  Alcohol-Tobacco     Alcohol drinks/day: 0     Alcohol Counseling: not indicated; patient does not drink     Smoking Status: quit     Tobacco Counseling: to remain off tobacco products  Hep-HIV-STD-Contraception     Hepatitis Risk: no risk noted     HIV Risk: no risk noted     STD Risk:  no risk noted      Sexual History:  currently monogamous.        Drug Use:  never.        Blood Transfusions:  no.    Clinical Review Panels:  Prevention   Last Mammogram:  Normal Bilateral (02/23/2010)   Last Pap Smear:  Normal (11/18/2008)   Last Colonoscopy:  Results: Diverticulosis.       Location:  [place].  (07/07/2007)  Immunizations   Last Tetanus Booster:  Tdap (01/26/2010)   Last Flu Vaccine:  Fluvax MCR (01/26/2010)   Last Pneumovax:  Pneumovax (01/03/2009)  Lipid Management   Cholesterol:  197 (09/24/2009)   LDL (bad choesterol):  116 (09/24/2009)   HDL (good cholesterol):  57.70 (09/24/2009)  Diabetes Management   HgBA1C:  6.1 (01/26/2010)   Creatinine:  0.9 (01/26/2010)   Last Dilated Eye Exam:  No diabetic retinopathy.   Visual acuity OS (best corrected):    20/20  Visual acuity OD (best corrected):     20/20   Mild Cataracts (03/24/2010)   Last Foot Exam:  yes (03/27/2010)   Last Flu Vaccine:  Fluvax MCR (01/26/2010)   Last Pneumovax:  Pneumovax (01/03/2009)  CBC   WBC:  7.1 (01/26/2010)   RBC:  3.77 (01/26/2010)  Hgb:  11.4 (01/26/2010)   Hct:  33.5 (01/26/2010)   Platelets:  355.0 (01/26/2010)   MCV  89.0 (01/26/2010)   MCHC  34.0 (01/26/2010)   RDW  14.3 (01/26/2010)   PMN:  80.3 (01/26/2010)   Lymphs:  10.8 (01/26/2010)   Monos:  5.0 (01/26/2010)   Eosinophils:  3.2 (01/26/2010)   Basophil:  0.7 (01/26/2010)  Complete Metabolic Panel   Glucose:  118 (01/26/2010)   Sodium:  140 (01/26/2010)   Potassium:  4.9 (01/26/2010)   Chloride:  104 (01/26/2010)   CO2:  27 (01/26/2010)   BUN:  21 (01/26/2010)   Creatinine:  0.9 (01/26/2010)   Albumin:  4.1 (01/26/2010)   Total Protein:  6.8 (01/26/2010)   Calcium:  9.0 (01/26/2010)   Total Bili:  0.4 (01/26/2010)   Alk Phos:  81 (01/26/2010)   SGPT (ALT):  12 (01/26/2010)   SGOT (AST):  20 (01/26/2010)   Medications Prior to Update: 1)  Actos 45 Mg  Tabs (Pioglitazone  Hcl) .... Once Daily 2)  Aspirin 81 Mg  Tbec (Aspirin) .... Once Daily 3)  Furosemide 20 Mg  Tabs (Furosemide) .... Take One Tablet Once Daily 4)  Klor-Con M20 20 Meq  Tbcr (Potassium Chloride Crys Cr) .... Once Daily 5)  Metformin Hcl 500 Mg Xr24h-Tab (Metformin Hcl) .... 3 Tablets At Bedtime, 1 Tablet in Morning 6)  Prilosec Otc 20 Mg  Tbec (Omeprazole Magnesium) .Marland Kitchen.. 1 Once Daily 7)  Vitamin D 50000 Unit Caps (Ergocalciferol) .... Every Monday 8)  Warfarin Sodium 4 Mg Tabs (Warfarin Sodium) .... Take As Directed 9)  Warfarin Sodium 2 Mg Tabs (Warfarin Sodium) .... Take As Directed By Coumadin Clinic. 10)  Vitamin B-12 2500 Mcg Subl (Cyanocobalamin) .... Once Daily 11)  Micardis 20 Mg Tabs (Telmisartan) .... 1/2 Tab By Mouth Once Daily 12)  Metoprolol Succinate 100 Mg Xr24h-Tab (Metoprolol Succinate) .... Take 1 Tablet By Mouth Daily...with A 50mg  Tab To Equal 150mg  Daily 13)  Calcium 600 1500 Mg Tabs (Calcium Carbonate) .... Tab By Mouth Once Daily 14)  Flecainide Acetate 100 Mg Tabs (Flecainide Acetate) .... Take One Tablet By Mouth Every 12 Hours  Current Medications (verified): 1)  Actos 45 Mg  Tabs (Pioglitazone Hcl) .... Once Daily 2)  Aspirin 81 Mg  Tbec (Aspirin) .... Once Daily 3)  Furosemide 20 Mg  Tabs (Furosemide) .... Take One Tablet Once Daily 4)  Klor-Con M20 20 Meq  Tbcr (Potassium Chloride Crys Cr) .... Once Daily 5)  Metformin Hcl 500 Mg Xr24h-Tab (Metformin Hcl) .... 3 Tablets At Bedtime, 1 Tablet in Morning 6)  Prilosec Otc 20 Mg  Tbec (Omeprazole Magnesium) .Marland Kitchen.. 1 Once Daily 7)  Vitamin D 50000 Unit Caps (Ergocalciferol) .... Every Monday 8)  Warfarin Sodium 4 Mg Tabs (Warfarin Sodium) .... Take As Directed 9)  Warfarin Sodium 2 Mg Tabs (Warfarin Sodium) .... Take As Directed By Coumadin Clinic. 10)  Vitamin B-12 2500 Mcg Subl (Cyanocobalamin) .... Once Daily 11)  Micardis 20 Mg Tabs (Telmisartan) .... 1/2 Tab By Mouth Once Daily 12)  Metoprolol Succinate 100 Mg  Xr24h-Tab (Metoprolol Succinate) .... Take 1 Tablet By Mouth Daily...with A 50mg  Tab To Equal 150mg  Daily 13)  Calcium 600 1500 Mg Tabs (Calcium Carbonate) .... Tab By Mouth Once Daily 14)  Flecainide Acetate 100 Mg Tabs (Flecainide Acetate) .... Take One Tablet By Mouth Every 12 Hours 15)  Ciprofloxacin Hcl 250 Mg Tabs (Ciprofloxacin Hcl) .... 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 Redo Cabg 4/10 due to occluded LM at implantation site (LIMA to LAD, SVG to OM) FOOT SURGERY to remove neuroma  Family History: Last updated: 08/09/2007 Family History of Colon Cancer: grandmother, uncle Family History of Breast Cancer: aunt, cousin Family History of Diabetes: uncle, grandmother,uncle  Social History: Last updated: 01/25/2008 Patient is a former smoker.  quit 1985 Alcohol Use - no Illicit Drug Use - no Patient does not get regular exercise.  Married   Risk Factors: Alcohol Use: 0 (03/27/2010) Exercise: no (08/09/2007)  Risk Factors: Smoking Status: quit (03/27/2010)  Family History: Reviewed history from 08/09/2007 and no changes required. Family History of Colon Cancer: grandmother, uncle Family History of Breast Cancer: aunt, cousin Family History of Diabetes: uncle, grandmother,uncle  Social History: Reviewed history from 01/25/2008 and no changes required. Patient is a former smoker.  quit 1985 Alcohol Use - no Illicit Drug Use - no Patient does not get regular exercise.  Married    Review of Systems  The patient denies anorexia, fever, chest pain, peripheral edema, abdominal pain, hematuria, incontinence, genital sores, muscle weakness, suspicious skin lesions, abnormal bleeding, and enlarged lymph nodes.   GU:  Complains of nocturia, urinary frequency, and urinary hesitancy; denies abnormal vaginal bleeding, decreased libido, discharge, dysuria, hematuria, and incontinence.  Physical Exam  General:  well-developed well-nourished in no acute distress. Skin is warm and dry.overweight-appearing.   Head:  normocephalic and atraumatic.   Mouth:  Oral mucosa and oropharynx without lesions or exudates.  Teeth in good repair. Neck:  supple, full ROM, no masses, normal carotid upstroke, no carotid bruits, and no cervical lymphadenopathy.   Lungs:  Normal respiratory effort, chest expands symmetrically. Lungs are clear to auscultation, no crackles or wheezes. Heart:  regular rate and rhythm with [id number] systolic murmur left sternal border and right 2nd ICS with radiation to carotids. No diastolic murmur noted. no gallop, no rub, and no JVD.   Abdomen:  Bowel sounds positive,abdomen soft and non-tender without masses, organomegaly or hernias noted. No CVAT. Msk:  normal ROM, no joint tenderness, no joint swelling, and no joint warmth.   Pulses:  R and L carotid,radial,femoral,dorsalis pedis and posterior tibial pulses are full and equal bilaterally Extremities:  No clubbing, cyanosis, edema, or deformity noted with normal full range of motion of all joints.   Neurologic:  No cranial nerve deficits noted. Station and gait are normal. Plantar reflexes are down-going bilaterally. DTRs are symmetrical throughout. Sensory, motor and coordinative functions appear intact. Skin:  turgor normal, color normal, no rashes, no suspicious lesions, no ecchymoses, no petechiae, no purpura, no ulcerations, and no edema.   Cervical Nodes:  no anterior cervical adenopathy and no posterior cervical  adenopathy.   Psych:  Cognition and judgment appear intact. Alert and cooperative with normal attention span and concentration. No apparent delusions, illusions, hallucinations  Diabetes Management Exam:    Foot Exam (with socks and/or shoes not present):       Sensory-Pinprick/Light touch:          Left medial foot (L-4): normal  Left dorsal foot (L-5): normal          Left lateral foot (S-1): normal          Right medial foot (L-4): normal          Right dorsal foot (L-5): normal          Right lateral foot (S-1): normal       Sensory-Monofilament:          Left foot: normal          Right foot: normal       Inspection:          Left foot: normal          Right foot: normal       Nails:          Left foot: normal          Right foot: normal   Impression & Recommendations:  Problem # 1:  UTI (ICD-599.0) Assessment Deteriorated  Her updated medication list for this problem includes:    Ciprofloxacin Hcl 250 Mg Tabs (Ciprofloxacin hcl) ..... One by mouth two times a day for 5 days  Orders: T-Urine Culture (Spectrum Order) [id number]) TLB-Udip w/ Micro (81001-URINE)  Encouraged to push clear liquids, get enough rest, and take acetaminophen as needed. To be seen in 10 days if no improvement, sooner if worse.  Problem # 2:  ESSENTIAL HYPERTENSION, BENIGN (ICD-401.1) Assessment: Improved  Her updated medication list for this problem includes:    Furosemide 20 Mg Tabs (Furosemide) .Marland Kitchen... Take one tablet once daily    Micardis 20 Mg Tabs (Telmisartan) .Marland Kitchen... 1/2 tab by mouth once daily    Metoprolol Succinate 100 Mg Xr24h-tab (Metoprolol succinate) .Marland Kitchen... Take 1 tablet by mouth daily...with a 50mg  tab to equal 150mg  daily  BP today: 128/72 Prior BP: 122/78 (01/26/2010)  Prior 10 Yr Risk Heart Disease: N/A (10/17/2008)  Labs Reviewed: K+: 4.9 (01/26/2010) Creat: : 0.9 (01/26/2010)   Chol: 197 (09/24/2009)   HDL: 57.70 (09/24/2009)   LDL: 116 (09/24/2009)   TG: 116.0  (09/24/2009)  Problem # 3:  DM (ICD-250.00) Assessment: Unchanged  Her updated medication list for this problem includes:    Actos 45 Mg Tabs (Pioglitazone hcl) ..... Once daily    Aspirin 81 Mg Tbec (Aspirin) ..... Once daily    Metformin Hcl 500 Mg Xr24h-tab (Metformin hcl) .Marland KitchenMarland KitchenMarland KitchenMarland Kitchen 3 tablets at bedtime, 1 tablet in morning    Micardis 20 Mg Tabs (Telmisartan) .Marland Kitchen... 1/2 tab by mouth once daily  Labs Reviewed: Creat: 0.9 (01/26/2010)     Last Eye Exam: No diabetic retinopathy.   Visual acuity OS (best corrected):    20/20  Visual acuity OD (best corrected):     20/20   Mild Cataracts  (03/24/2010) Reviewed HgBA1c results: 6.1 (01/26/2010)  6.1 (09/24/2009)  Complete Medication List: 1)  Actos 45 Mg Tabs (Pioglitazone hcl) .... Once daily 2)  Aspirin 81 Mg Tbec (Aspirin) .... Once daily 3)  Furosemide 20 Mg Tabs (Furosemide) .... Take one tablet once daily 4)  Klor-con M20 20 Meq Tbcr (Potassium chloride crys cr) .... Once daily 5)  Metformin Hcl 500 Mg Xr24h-tab (Metformin hcl) .... 3 tablets at bedtime, 1 tablet in morning 6)  Prilosec Otc 20 Mg Tbec (Omeprazole magnesium) .Marland Kitchen.. 1 once daily 7)  Vitamin D 50000 Unit Caps (Ergocalciferol) .... Every monday 8)  Warfarin Sodium 4 Mg Tabs (Warfarin sodium) .... Take as directed 9)  Warfarin Sodium 2 Mg Tabs (Warfarin sodium) .Marland KitchenMarland KitchenMarland Kitchen  Take as directed by coumadin clinic. 10)  Vitamin B-12 2500 Mcg Subl (Cyanocobalamin) .... Once daily 11)  Micardis 20 Mg Tabs (Telmisartan) .... 1/2 tab by mouth once daily 12)  Metoprolol Succinate 100 Mg Xr24h-tab (Metoprolol succinate) .... Take 1 tablet by mouth daily...with a 50mg  tab to equal 150mg  daily 13)  Calcium 600 1500 Mg Tabs (Calcium carbonate) .... Tab by mouth once daily 14)  Flecainide Acetate 100 Mg Tabs (Flecainide acetate) .... Take one tablet by mouth every 12 hours 15)  Ciprofloxacin Hcl 250 Mg Tabs (Ciprofloxacin hcl) .... One by mouth two times a day for 5 days  PAP Screening:     Hx Cervical Dysplasia in last 5 yrs? No    3 normal PAP smears in last 5 yrs? Yes    Last PAP smear:  11/18/2008    Reviewed PAP smear recommendations:  patient defers to GYN provider  Mammogram Screening:    Last Mammogram:  02/23/2010  Osteoporosis Risk Assessment:  Risk Factors for Fracture or Low Bone Density:   Race (White or Asian):     yes   Hx of Fractures:       no   FH of Osteoporosis:     yes   Hx of falls:       no   Physically inactive:     no   Smoking status:       quit   High alcohol use:     no   Low calcium/Vit. D intake:   yes   Corticosteroid use:     no   Heparin use:       no   Thyroid disease:     no   Rheumatoid Arthritis:     no  Immunization & Chemoprophylaxis:    Tetanus vaccine: Tdap  (01/26/2010)    Influenza vaccine: Fluvax MCR  (01/26/2010)    Pneumovax: Pneumovax  (01/03/2009)  Patient Instructions: 1)  Please schedule a follow-up appointment in 1 month. 2)  It is important that you exercise regularly at least 20 minutes 5 times a week. If you develop chest pain, have severe difficulty breathing, or feel very tired , stop exercising immediately and seek medical attention. 3)  You need to lose weight. Consider a lower calorie diet and regular exercise.  4)  Check your blood sugars regularly. If your readings are usually above 200 or below 70 you should contact our office. 5)  It is important that your Diabetic A1c level is checked every 3 months. 6)  See your eye doctor yearly to check for diabetic eye damage. 7)  Check your feet each night for sore areas, calluses or signs of infection. 8)  Check your Blood Pressure regularly. If it is above 130/80: you should make an appointment. 9)  Take your antibiotic as prescribed until ALL of it is gone, but stop if you develop a rash or swelling and contact our office as soon as possible. Prescriptions: CIPROFLOXACIN HCL 250 MG TABS (CIPROFLOXACIN HCL) One by mouth two times a day for 5 days  #10 x 1    Entered and Authorized by:   Janith Lima MD   Signed by:   Janith Lima MD on 03/27/2010   Method used:   Electronically to        [id number] (retail)       [address]       [place], [place]  [address]       Ph:  [phone] or [phone]       Fax: [phone]   RxIDNQ:[id number]    Orders Added: 1)  T-Urine Culture (Spectrum Order) [id number] 2)  Est. Patient Level IV [id number] 3)  TLB-Udip w/ Micro [81001-URINE]

## 2010-04-09 NOTE — Progress Notes (Signed)
Summary: REFILL REQUEST  Phone Note Refill Request Message from:  Patient on September 02, 2009 8:11 AM  Refills Requested: Medication #1:  MICARDIS 20 MG TABS 1/2 tab by mouth once daily [facility] [address]   Method Requested: Telephone to Pharmacy Initial call taken by: Lorenda Hatchet,  September 02, 2009 8:12 AM    Prescriptions: MICARDIS 20 MG TABS (TELMISARTAN) 1/2 tab by mouth once daily  #30 x 12   Entered by:   Burnett Kanaris   Authorized by:   Colin Mulders, MD, [facility]   Signed by:   Burnett Kanaris on 09/02/2009   Method used:   Electronically to        [id number] (retail)       [address]       [place], [place]  [address]       Ph: [phone] or [phone]       Fax: [phone]   RxID:   [id number]

## 2010-04-09 NOTE — Progress Notes (Signed)
Summary: labs  Phone Note Call from Patient Call back at Home Phone [phone]   Summary of Call: Patient left message on triage that she needs labs prior to appt. Please call to discuss. Initial call taken by: Ernestene Mention,  May 09, 2009 11:55 AM  Follow-up for Phone Call        left mess to call office back, dr Ronnald Ramp does not do labs prior to office visit  Follow-up by: Charlsie Quest, CMA,  May 09, 2009 4:02 PM  Additional Follow-up for Phone Call Additional follow up Details #1::        Spoke wit pt, she wants the labs dr Jacalyn Lefevre office wants to order done here. She will call their office and get order.  Additional Follow-up by: Charlsie Quest, [place],  May 12, 2009 9:13 AM

## 2010-04-09 NOTE — Miscellaneous (Signed)
  Clinical Lists Changes  Observations: Added new observation of DIAB EYE EX: No diabetic retinopathy.   Visual acuity OS (best corrected):    20/20  Visual acuity OD (best corrected):     20/20   Mild Cataracts  (03/24/2010 14:24)      Diabetic Eye Exam  Procedure date:  03/24/2010  Findings:      No diabetic retinopathy.   Visual acuity OS (best corrected):    20/20  Visual acuity OD (best corrected):     20/20   Mild Cataracts

## 2010-04-09 NOTE — Medication Information (Signed)
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 yes       Details: Metoprolol increased to 150mg s daily on 06/27/09 at ER  Recent/future dental: no  Any missed doses?: no       Is patient compliant with meds? yes       Current Medications (verified): 1)  Actos 45 Mg  Tabs (Pioglitazone Hcl) .... Once Daily 2)  Aspirin 81 Mg  Tbec (Aspirin) .... Once Daily 3)  Furosemide 20 Mg  Tabs (Furosemide) .... Take One Tablet Once Daily 4)  Klor-Con M20 20 Meq  Tbcr (Potassium Chloride Crys Cr) .... Once Daily 5)  Metformin Hcl 500 Mg Xr24h-Tab (Metformin Hcl) .... 3 Tablets At Bedtime, 1 Tablet in Morning 6)  Prilosec Otc 20 Mg  Tbec (Omeprazole Magnesium) .Marland Kitchen.. 1 Once Daily 7)  [facility] Iron 325 (65 Fe) Mg Tabs (Ferrous Sulfate) .Marland Kitchen.. 1 Tablet By Mouth As Needed 8)  Vitamin D 50000 Unit Caps (Ergocalciferol) .... Every Other Week 9)  Warfarin Sodium 4 Mg Tabs (Warfarin Sodium) .... Take As Directed 10)  Warfarin Sodium 2 Mg Tabs (Warfarin Sodium) .... Take As Directed By Coumadin Clinic. 11)  Nascobal 500 Mcg/0.29ml Soln (Cyanocobalamin) .... One Puff in A Nostril Once A Week 12)  Micardis 20 Mg Tabs (Telmisartan) .... 1/2 Tab By Mouth Once Daily 13)  Metoprolol Succinate 100 Mg Xr24h-Tab (Metoprolol Succinate) .... Take 1.5 Tablet By Mouth Daily 14)  Prilosec 20 Mg Cpdr (Omeprazole) .Marland Kitchen.. 1 Tab By Mouth Once Daily 15)  Calcium 600 1500 Mg Tabs (Calcium Carbonate) .... Tab By Mouth Once Daily  Allergies (verified): No Known Drug Allergies  Anticoagulation Management History:      The  patient is taking warfarin and comes in today for a routine follow up visit.  Positive risk factors for bleeding include an age of 69 years or older and presence of serious comorbidities.  Negative risk factors for bleeding include no history of CVA/TIA.  The bleeding index is 'intermediate risk'.  Positive CHADS2 values include History of HTN and History of Diabetes.  Negative CHADS2 values include Age > 69 years old and Prior Stroke/CVA/TIA.  The start date was 07/11/2006.  Her last INR was 2.6.  Anticoagulation responsible provider: Lia Foyer MD, Marcello Moores.  INR POC: 2.3.  Cuvette Lot#: [id number].  Exp: 08/2010.    Anticoagulation Management Assessment/Plan:      The patient's current anticoagulation dose is Warfarin sodium 4 mg tabs: Take as directed, Warfarin sodium 2 mg tabs: Take as directed by coumadin clinic..  The target INR is 2.0-3.0.  The next INR is due 08/11/2009.  Anticoagulation instructions were given to patient.  Results were reviewed/authorized by Tula Nakayama, RN, BSN.  She was notified by Tula Nakayama, RN, BSN.         Prior Anticoagulation Instructions: INR 2.2  Continue on same dosage 4mg  daily except 2mg  on Mondays, Wednesdays, and Fridays.  Recheck in 4 weeks.    Current Anticoagulation Instructions: INR 2.3 Continue 4mg  daily except 2mg s on Mondays, Wednesdays and Fridays. Recheck in 4 weeks.

## 2010-04-09 NOTE — Progress Notes (Signed)
Summary: pt heart rate is still elevated  Phone Note Call from Patient Call back at Home Phone [phone]   Caller: Patient Reason for Call: Talk to Nurse, Talk to Doctor Summary of Call: pt heart is still at 100 and she dosen't feel bad but she thinks she might go to ER but she wants to talk to you about it first. Initial call taken by: Shelda Pal,  October 29, 2009 8:19 AM  Follow-up for Phone Call        spoke with pt, she cont to be in atrial fib with a rate of 103-115. with exerction her rate increases to 120. she has taken her metoprolol 150mg  and flecainide 50mg  this am. she feels fine. will discuss with dr Stanford Breed Fredia Beets, RN  October 29, 2009 8:37 AM  discussed with dr Stanford Breed, pt to go to the ER for poss cardioversion. cardmaster and ER notified Fredia Beets, RN  October 29, 2009 8:46 AM

## 2010-04-09 NOTE — Medication Information (Signed)
Summary: rov/ewj  Anticoagulant Therapy  Managed by: Porfirio Oar, PharmD Referring MD: [name] MD PCP: Janith Lima MD Supervising MD: Angelena Form MD, Harrell Gave Indication 1: Aortic Valve Disorder (ICD-424.1) Lab Used: [facility] Site: [facility] INR RANGE 2 - 3  Dietary changes: no    Health status changes: no    Bleeding/hemorrhagic complications: no    Recent/future hospitalizations: no    Any changes in medication regimen? no    Recent/future dental: no  Any missed doses?: no       Is patient compliant with meds? yes       Allergies: No Known Drug Allergies  Anticoagulation Management History:      The patient is taking warfarin and comes in today for a routine follow up visit.  Positive risk factors for bleeding include an age of 69 years or older and presence of serious comorbidities.  Negative risk factors for bleeding include no history of CVA/TIA.  The bleeding index is 'intermediate risk'.  Positive CHADS2 values include History of HTN and History of Diabetes.  Negative CHADS2 values include Age > 73 years old and Prior Stroke/CVA/TIA.  The start date was 07/11/2006.  Her last INR was 2.6.  Anticoagulation responsible provider: Angelena Form MD, Harrell Gave.  Cuvette Lot#: [id number].  Exp: 12/2010.    Anticoagulation Management Assessment/Plan:      The patient's current anticoagulation dose is Warfarin sodium 4 mg tabs: Take as directed, Warfarin sodium 2 mg tabs: Take as directed by coumadin clinic..  The target INR is 2.0-3.0.  The next INR is due 11/11/2009.  Anticoagulation instructions were given to patient.  Results were reviewed/authorized by Porfirio Oar, PharmD.  She was notified by Vassie Loll, PharmD Candidate.         Prior Anticoagulation Instructions: INR 2.6  Continue on same dosage 2 tablets daily except 1 tablet on Mondays and Fridays.  Recheck in 3 weeks.    Current Anticoagulation Instructions: INR- 2.5  Continue taking 2 tablets (4mg )  every day except take 1 tablet (2mg ) on Mon and Fri.

## 2010-04-09 NOTE — Medication Information (Signed)
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 no    Recent/future dental: no  Any missed doses?: no       Is patient compliant with meds? yes       Allergies (verified): No Known Drug Allergies  Anticoagulation Management History:      The patient is taking warfarin and comes in today for a routine follow up visit.  Positive risk factors for bleeding include an age of 30 years or older and presence of serious comorbidities.  Negative risk factors for bleeding include no history of CVA/TIA.  The bleeding index is 'intermediate risk'.  Positive CHADS2 values include History of HTN and History of Diabetes.  Negative CHADS2 values include Age > 55 years old and Prior Stroke/CVA/TIA.  The start date was 07/11/2006.  Her last INR was 2.6.  Anticoagulation responsible provider: Angelena Form MD, Harrell Gave.  INR POC: 2.2.  Cuvette Lot#: [id number].  Exp: 07/2010.    Anticoagulation Management Assessment/Plan:      The patient's current anticoagulation dose is Warfarin sodium 4 mg tabs: Take as directed, Warfarin sodium 2 mg tabs: Take as directed by coumadin clinic..  The target INR is 2.0-3.0.  The next INR is due 07/14/2009.  Anticoagulation instructions were given to patient.  Results were reviewed/authorized by Freddrick March, RN, BSN.  She was notified by Freddrick March RN.         Prior Anticoagulation Instructions: INR = 2.7  The patient is to continue with the same dose of coumadin  2mg  Monday, Wednesday, Friday 4mg  Sunday, Tuesday, Thursday, Saturday    Current  Anticoagulation Instructions: INR 2.2  Continue on same dosage 4mg  daily except 2mg  on Mondays, Wednesdays, and Fridays.  Recheck in 4 weeks.

## 2010-04-09 NOTE — Assessment & Plan Note (Signed)
Summary: EC6/ATRIAL FIB/? TIKOSYN-ABLATION/DM   Visit Type:  Follow-up Primary Provider:  Janith Lima MD   History of Present Illness: Ms Talluto is a pleasant 70 yo WF with a h/o valvular heart disease and paroxysmal atrial fibrillation who presents today for EP consultation.  He has a history of severe AS, mitral regurgitation, aneurysm of the ascending thoracic aorta (status post aortic valve replacement, mitral valve repair, resection and grafting of the ascending  thoracic aortic aneurysm on Jul 07, 2006). She also underwent surgical maze  at that time for atrial fibrillation. She then had a followup catheterization performed secondary to elevated troponin and recurrent atrial fibrillation which revealed an ejection fraction of 25-30% and occlusion of the reimplanted left main.  The patient subsequently underwent redo coronary artery bypass graft with a LIMA to the LAD and a saphenous vein graft to the OM. She also had an epicardial lead placed if she required biventricular pacing in the future. A followup echocardiogram was performed in March of 2011 which revealed that her LV function was normal. The prosthetic aortic valve was functioning appropriately with only mild aortic insufficiency. There was mild mitral stenosis  following mitral valve repair. There was mild right atrial and right ventricular enlargement.  Because her coronary occlusion was felt to be due to the "glue" from or thoracic aneurysm repair and not atheroscelorsis, she was placed on low dose flecainide.  She had recurrent symtpoms of afib and was found to have RVR.  Her flecainide was increased to 100mg  two times a day.  She had a subsequent GXT which revealed no exercise induced arrhythmias.  She has done very well since that time without recurrence of symptomatic afib.  At this time, she feels well.  She is tolerating her medicine and is without complaint today.  Current Medications (verified): 1)  Actos 45 Mg  Tabs  (Pioglitazone Hcl) .... Once Daily 2)  Aspirin 81 Mg  Tbec (Aspirin) .... Once Daily 3)  Furosemide 20 Mg  Tabs (Furosemide) .... Take One Tablet Once Daily 4)  Klor-Con M20 20 Meq  Tbcr (Potassium Chloride Crys Cr) .... Once Daily 5)  Metformin Hcl 500 Mg Xr24h-Tab (Metformin Hcl) .... 3 Tablets At Bedtime, 1 Tablet in Morning 6)  Prilosec Otc 20 Mg  Tbec (Omeprazole Magnesium) .Marland Kitchen.. 1 Once Daily 7)  Cvs Iron 325 (65 Fe) Mg Tabs (Ferrous Sulfate) .Marland Kitchen.. 1 Tablet By Mouth As Needed 8)  Vitamin D 50000 Unit Caps (Ergocalciferol) .... Every Monday 9)  Warfarin Sodium 4 Mg Tabs (Warfarin Sodium) .... Take As Directed 10)  Warfarin Sodium 2 Mg Tabs (Warfarin Sodium) .... Take As Directed By Coumadin Clinic. 11)  Vitamin B-12 2500 Mcg Subl (Cyanocobalamin) .... Once Daily 12)  Micardis 20 Mg Tabs (Telmisartan) .... 1/2 Tab By Mouth Once Daily 13)  Metoprolol Succinate 100 Mg Xr24h-Tab (Metoprolol Succinate) .... Take 1 Tablet By Mouth Daily 14)  Calcium 600 1500 Mg Tabs (Calcium Carbonate) .... Tab By Mouth Once Daily 15)  Flecainide Acetate 100 Mg Tabs (Flecainide Acetate) .... Take One Tablet By Mouth Every 12 Hours 16)  Metoprolol Succinate 50 Mg Xr24h-Tab (Metoprolol Succinate) .Marland Kitchen.. 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 Homograft aortic root replacement, MV repair due to severe  MR,   resection and grafting of ascending thoracic aortic aneurysm and left sided maze in 2008. Redo Cabg 4/10 due to occluded LM at implantation site (LIMA to LAD, SVG to OM) FOOT SURGERY to remove neuroma  Family History: Reviewed history from 08/09/2007 and no changes required. Family History of  Colon Cancer: grandmother, uncle Family History of Breast Cancer: aunt, cousin Family History of Diabetes: uncle, grandmother,uncle  Social History: Reviewed history from 01/25/2008 and no changes required. Patient is a former smoker.  quit 1985 Alcohol Use - no Illicit Drug Use - no Patient does not get regular exercise.  Married   Review of Systems       All systems are reviewed and negative except as listed in the HPI.   Vital Signs:  Patient profile:   70 year old female Height:      68 inches Weight:      190 pounds BMI:     28.99 Pulse rate:   60 / minute BP sitting:   130 / 80  (left arm)  Vitals Entered By: Margaretmary Bayley CMA (December 08, 2009 11:13 AM)  Physical Exam  General:  Well-developed well-nourished in no acute distress.  Skin is warm and dry.  HEENT is normal.  Neck is supple. No thyromegaly.  Chest is clear to auscultation with normal expansion.  Cardiovascular exam is regular rate and rhythm. 2/6 systolic murmur left sternal border. Abdominal exam nontender or distended. No masses palpated. Extremities show no edema. neuro grossly intact Skin- no lesions   Echocardiogram  Procedure date:  06/02/2007  Findings:        SUMMARY   -  There is some septal dyssynergy. Overall left ventricular         systolic function was at the lower limits of normal. Left         ventricular ejection fraction was estimated to be 55 %. Left         ventricular wall thickness was moderately increased.   -  The tissue aortic valve prosthesis is working well. There was         mild aortic valvular regurgitation. The mean transaortic         valve gradient was 7 mmHg.   -  The mitral ring is in place. There is mild MR. There is some         increase in the mitral inflow gradient. I can not rule out         some functional mitral stenosis. There was mild mitral         valvular regurgitation.   -  The left atrium was mildly dilated.   -  Right ventricular size was at  the upper limits of normal.    Impression & Recommendations:  Problem # 1:  ATRIAL FIBRILLATION (ICD-427.31) The patient is a very pleasant 70yo female with a complex valvular history and paroxysmal atrial fibrillation.  Presently, she is tolerating flecainide without difficulty.  Her atrial fibrillation appears to be controlled with flecainide 100mg  two times a day at this time.  She does not have coronary artery disease, though she has required coronary reimplantation/ grafting following "glue" deposition within the vessel at time of thoracic aneurysm repair. Therapeutic strategies for afib including medicine and ablation were discussed in detail with the patient and her spouse  today.  We discussed risks of flecainde, tikosyn, and ablation.  As the patient is presently doing well with flecainide and recent GXT revealed no  arrhythmias, she is very clear in her decision to continue flecainide at this time.  If she develops recurrent atrial fibrillation with flecainide, then our options would be tikosyn vs ablation.  She wishes to follow-up with Dr Stanford Breed and will contact my office should she desire catheter ablation in the future.  Problem # 2:  ESSENTIAL HYPERTENSION, BENIGN (ICD-401.1) stable no changes today  Problem # 3:  COUMADIN THERAPY (ICD-V58.61) I would recommend that she continue coumadin longterm as she has valvular heart disease, she is not a candidate for pradaxa at this time  Other Orders: EKG w/ Interpretation (93000)

## 2010-04-09 NOTE — Medication Information (Signed)
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 yes       Details: increased flecainide to 100 mg bid  Recent/future dental: no  Any missed doses?: no       Is patient compliant with meds? yes       Allergies: No Known Drug Allergies  Anticoagulation Management History:      The patient is taking warfarin and comes in today for a routine follow up visit.  Positive risk factors for bleeding include an age of 69 years or older and presence of serious comorbidities.  Negative risk factors for bleeding include no history of CVA/TIA.  The bleeding index is 'intermediate risk'.  Positive CHADS2 values include History of HTN and History of Diabetes.  Negative CHADS2 values include Age > 69 years old and Prior Stroke/CVA/TIA.  The start date was 07/11/2006.  Her last INR was 2.6 and today's INR is 3.6.  Anticoagulation responsible provider: Johnsie Cancel MD,Peter.  INR POC: 3.6.  Cuvette Lot#: [id number].  Exp: 12/2010.    Anticoagulation Management Assessment/Plan:      The patient's current anticoagulation dose is Warfarin sodium 4 mg tabs: Take as directed, Warfarin sodium 2 mg tabs: Take as directed by coumadin clinic..  The target INR is 2.0-3.0.  The next INR is due 11/18/2009.  Anticoagulation instructions were given to patient.  Results were reviewed/authorized by Porfirio Oar, PharmD.  She was notified by Freddrick March RN.         Prior Anticoagulation Instructions: INR- 2.5  Continue taking 2 tablets (4mg ) every day except take 1 tablet (2mg ) on Mon and Fri.    Current Anticoagulation Instructions: INR 3.6 Hold dose for today. Do 1 tablet on wednesday instead of 2. Then go back to schedule of 2 tablets on sunday, 1 on monday, 2 tablets on tuesday, 1 on wednesday, 2 on thursdays, 1 on friday, and 2 on saturday. See you in 1 week.

## 2010-04-09 NOTE — Medication Information (Signed)
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 Site: [facility] INR POC 3.2 INR RANGE 2 - 3  Dietary changes: no    Health status changes: no    Bleeding/hemorrhagic complications: no    Recent/future hospitalizations: no    Any changes in medication regimen? no    Recent/future dental: no  Any missed doses?: no       Is patient compliant with meds? yes       Allergies: No Known Drug Allergies  Anticoagulation Management History:      The patient is taking warfarin and comes in today for a routine follow up visit.  Positive risk factors for bleeding include an age of 60 years or older and presence of serious comorbidities.  Negative risk factors for bleeding include no history of CVA/TIA.  The bleeding index is 'intermediate risk'.  Positive CHADS2 values include History of HTN and History of Diabetes.  Negative CHADS2 values include Age > 6 years old and Prior Stroke/CVA/TIA.  The start date was 07/11/2006.  Her last INR was 3.6 and today's INR is 3.2.  Anticoagulation responsible provider: Burt Knack MD, Legrand Como.  INR POC: 3.2.  Exp: 01/2011.    Anticoagulation Management Assessment/Plan:      The patient's current anticoagulation dose is Warfarin sodium 4 mg tabs: Take as directed, Warfarin sodium 2 mg tabs: Take as directed by coumadin clinic..  The target INR is 2.0-3.0.  The next INR is due 04/13/2010.  Anticoagulation instructions were given to patient.  Results were reviewed/authorized by Porfirio Oar, PharmD.  She was notified by Ernst Bowler, PharmD candidate.         Prior Anticoagulation Instructions: INR 2.8  Continue same dose of 4mg  daily except 2mg  on Monday and Friday.  Recheck INR in 4 weeks.   Current Anticoagulation Instructions: INR 3.2 (INR goal: 2-3)  Skip today's dose.   Take 2 mg on Mondays and Fridays and 4 mg on Sundays, Tuesdays, Wednesdays, Thursdays, and Saturdays.  Recheck in 4 weeks.

## 2010-04-09 NOTE — Assessment & Plan Note (Signed)
Summary: ROV/ATRIAL FIB/DM   Primary Provider:  Janith Lima MD  CC:  a fib pt complains of.  History of Present Illness: Pleasant female with history of severe AS, mitral regurgitation, aneurysm of the ascending thoracic aorta (status post aortic root replacement, mitral valve repair, resection and grafting of the ascending  thoracic aortic aneurysm on Jul 07, 2006). History of paroxysmal atrial fibrillation (status post maze  procedure on Jul 07, 2006). Followup catheterization performed secondary to elevated troponin and recurrent atrial fibrillation  revealed an ejection fraction of 25-30% and occlusion of the reimplanted left main.  The patient subsequently underwent redo coronary artery bypass and graft with a LIMA to the LAD and a saphenous vein graft to the OM. She also had an epicardial lead placed if she required biventricular pacing in the future. A followup echocardiogram was performed in July of 2010 following revascularization.  Her LV function had returned to normal. The prosthetic aortic valve was functioning appropriately with only mild aortic insufficiency. There was mild mitral stenosis and mild mitral regurgitation following mitral valve repair. There was mild left atrial enlargement.  I last saw her in November of 2010. Since then she has not had dyspnea on exertion, orthopnea, PND, syncope or chest pain. However she saw Dr. Roxy Manns recently and was noted to be in recurrent atrial fibrillation. [place] and had [place] sudden episode where her heart rate increased to approximately 170. She was admitted overnight and apparently her enzymes were negative. She was placed on Cardizem and digoxin and converted spontaneously. She feels well other than palpitations. Note she r took pseudoephedrine and attributes these episodes to this medication.  Current Medications (verified): 1)  Actos 45 Mg  Tabs (Pioglitazone Hcl) .... Once Daily 2)  Aspirin 81 Mg  Tbec (Aspirin) .... Once  Daily 3)  Furosemide 20 Mg  Tabs (Furosemide) .... Take One Tablet Once Daily 4)  Klor-Con M20 20 Meq  Tbcr (Potassium Chloride Crys Cr) .... Once Daily 5)  Metformin Hcl 500 Mg Xr24h-Tab (Metformin Hcl) .... 3 Tablets At Bedtime, 1 Tablet in Morning 6)  Prilosec Otc 20 Mg  Tbec (Omeprazole Magnesium) .Marland Kitchen.. 1 Once Daily 7)  Cvs Iron 325 (65 Fe) Mg Tabs (Ferrous Sulfate) .Marland Kitchen.. 1 Tablet By Mouth As Needed 8)  Vitamin D 50000 Unit Caps (Ergocalciferol) .... Every Other Week 9)  Warfarin Sodium 4 Mg Tabs (Warfarin Sodium) .... Take As Directed 10)  Warfarin Sodium 2 Mg Tabs (Warfarin Sodium) .... Take As Directed By Coumadin Clinic. 11)  Nascobal 500 Mcg/0.97ml Soln (Cyanocobalamin) .... One Puff in A Nostril Once A Week 12)  Micardis 20 Mg Tabs (Telmisartan) .... 1/2 Tab By Mouth Once Daily 13)  Metoprolol Succinate 50 Mg Xr24h-Tab (Metoprolol Succinate) .Marland Kitchen.. 1 1/2 Tab By Mouth Once Daily 14)  Prilosec 20 Mg Cpdr (Omeprazole) .Marland Kitchen.. 1 Tab By Mouth Once Daily 15)  Calcium 600 1500 Mg Tabs (Calcium Carbonate) .... 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 Redo Cabg 4/10 due to occluded LM at implantation site (LIMA to LAD, SVG to OM) FOOT SURGERY to remove neuroma  Social History: Reviewed history from 01/25/2008 and no  changes required. Patient is a former smoker.  quit 1985 Alcohol Use - no Illicit Drug Use - no Patient does not get regular exercise.  Married   Review of Systems       Some fatigue but no fevers or chills, productive cough, hemoptysis, dysphasia, odynophagia, melena, hematochezia, dysuria, hematuria,  rash, seizure activity, orthopnea, PND, pedal edema, claudication. Remaining systems are negative.   Vital Signs:  Patient profile:   70 year old female Height:      68 inches Weight:      189 pounds BMI:     28.84 Pulse rate:   84 / minute Resp:     14 per minute BP sitting:   130 / 72  (left arm)  Vitals Entered By: Burnett Kanaris (May 06, 2009 4:22 PM)  Physical Exam  General:  Well-developed well-nourished in no acute distress.  Skin is warm and dry.  HEENT is normal.  Neck is supple. No thyromegaly.  Chest is clear to auscultation with normal expansion.  Cardiovascular exam is irregular. 2/6 systolic murmur left sternal border. No diastolic murmur noted. Abdominal exam nontender or distended. No masses palpated. Extremities show no edema. neuro grossly intact    EKG  Procedure date:  05/06/2009  Findings:      Normal sinus rhythm at a rate of 84. Occasional PACs. No ST changes.  Impression & Recommendations:  Problem # 1:  MITRAL VALVE REPLACEMENT, HX OF (ICD-V15.1) Continue SBE prophylaxis. Orders: Echocardiogram (Echo)  Problem # 2:  ESSENTIAL HYPERTENSION, BENIGN (ICD-401.1) Blood pressure controlled on present medications. Will continue. Her updated medication list for this problem includes:    Aspirin 81 Mg Tbec (Aspirin) ..... Once daily    Furosemide 20 Mg Tabs (Furosemide) .Marland Kitchen... Take one tablet once daily    Micardis 20 Mg Tabs (Telmisartan) .Marland Kitchen... 1/2 tab by mouth once daily    Metoprolol Succinate 100 Mg Xr24h-tab (Metoprolol succinate) .Marland Kitchen... Take one tablet by mouth daily  Her updated medication list for this problem includes:    Aspirin 81 Mg Tbec (Aspirin) ..... Once daily    Furosemide 20 Mg Tabs (Furosemide) .Marland Kitchen... Take one tablet once daily    Micardis 20 Mg Tabs (Telmisartan) .Marland Kitchen... 1/2 tab by mouth once daily    Metoprolol Succinate 100 Mg Xr24h-tab (Metoprolol succinate) .Marland Kitchen... Take one tablet by mouth daily  Problem # 3:  AORTIC VALVE  REPLACEMENT, HX OF (ICD-V43.3) Continued SBE prophylaxis.  Problem # 4:  COUMADIN THERAPY (ICD-V58.61) Monitored in the Coumadin clinic. Goal INR 2-3.  Problem # 5:  ATRIAL FIBRILLATION (ICD-427.31) Patient is having recurrent episodes of atrial fibrillation. She attributes this to pseudoephedrine. However she did have increased atrial fibrillation previously when her left main implant occluded and she had reduced LV function. I will repeat her echocardiogram to reassess LV function. I will also check a TSH. I will increase her Toprol to 100 mg p.o. daily. She will continue on Coumadin. I think she is most likely susceptible to having recurrent paroxysmal atrial fibrillation. If this increases in frequency then she will most likely require an antiarrhythmic. I spent approximately 30 minutes in the office discussing this with she and her husband. Her updated medication list for this problem includes:    Aspirin 81 Mg Tbec (Aspirin) ..... Once daily    Warfarin Sodium 4 Mg Tabs (Warfarin sodium) .Marland Kitchen... Take as directed    Warfarin Sodium 2 Mg Tabs (Warfarin sodium) .Marland Kitchen... Take as directed by coumadin clinic.  Metoprolol Succinate 100 Mg Xr24h-tab (Metoprolol succinate) .Marland Kitchen... Take one tablet by mouth daily  Problem # 6:  DM (ICD-250.00)  Her updated medication list for this problem includes:    Actos 45 Mg Tabs (Pioglitazone hcl) ..... Once daily    Aspirin 81 Mg Tbec (Aspirin) ..... Once daily    Metformin Hcl 500 Mg Xr24h-tab (Metformin hcl) .Marland KitchenMarland KitchenMarland KitchenMarland Kitchen 3 tablets at bedtime, 1 tablet in morning    Micardis 20 Mg Tabs (Telmisartan) .Marland Kitchen... 1/2 tab by mouth once daily  Problem # 7:  HYPERLIPIDEMIA (ICD-272.4) Patient has declined statins previously.  Problem # 8:  OTHER SPEC FORMS CHRONIC ISCHEMIC HEART DISEASE (ICD-414.8) Patient had occlusion of her left main implant previously. She underwent redo coronary artery bypass and graft in her LV function normalized. Her updated medication list for this  problem includes:    Aspirin 81 Mg Tbec (Aspirin) ..... Once daily    Furosemide 20 Mg Tabs (Furosemide) .Marland Kitchen... Take one tablet once daily    Warfarin Sodium 4 Mg Tabs (Warfarin sodium) .Marland Kitchen... Take as directed    Warfarin Sodium 2 Mg Tabs (Warfarin sodium) .Marland Kitchen... Take as directed by coumadin clinic.    Micardis 20 Mg Tabs (Telmisartan) .Marland Kitchen... 1/2 tab by mouth once daily    Metoprolol Succinate 100 Mg Xr24h-tab (Metoprolol succinate) .Marland Kitchen... Take one tablet by mouth daily  Patient Instructions: 1)  Your physician recommends that you schedule a follow-up appointment in: 3 months 2)  Your physician has recommended you make the following change in your medication: INCREASE METOPROLOL TO 100MG  ONCE DAILY 3)  Your physician has requested that you have an echocardiogram.  Echocardiography is a painless test that uses sound waves to create images of your heart. It provides your doctor with information about the size and shape of your heart and how well your heart's chambers and valves are working.  This procedure takes approximately one hour. There are no restrictions for this procedure. Prescriptions: METOPROLOL SUCCINATE 100 MG XR24H-TAB (METOPROLOL SUCCINATE) Take one tablet by mouth daily  #90 x 4   Entered by:   Fredia Beets, RN   Authorized by:   Colin Mulders, MD, [facility]   Signed by:   Fredia Beets, RN on 05/06/2009   Method used:   Electronically to        [id number] (retail)       [address]       [place], [place]  [address]       Ph: [phone] or [phone]       Fax: [phone]   RxID:   [id number]

## 2010-04-09 NOTE — Letter (Signed)
Summary: [facility]   Imported By: Bubba Hales 03/23/2010 09:54:22  _____________________________________________________________________  External Attachment:    Type:   Image     Comment:   External Document

## 2010-04-09 NOTE — Medication Information (Signed)
Summary: rov/mb  Anticoagulant Therapy  Managed by: Judson Roch, PharmD Referring MD: [name] MD PCP: Janith Lima MD Supervising MD: Harrington Challenger MD, Nevin Bloodgood Indication 1: Aortic Valve Disorder (ICD-424.1) Lab Used: [facility] Site: [facility] INR POC 2.7 INR RANGE 2 - 3  Dietary changes: no    Health status changes: no    Bleeding/hemorrhagic complications: no    Recent/future hospitalizations: no    Any changes in medication regimen? no    Recent/future dental: no  Any missed doses?: no       Is patient compliant with meds? yes       Current Medications (verified): 1)  Actos 45 Mg  Tabs (Pioglitazone Hcl) .... Once Daily 2)  Aspirin 81 Mg  Tbec (Aspirin) .... Once Daily 3)  Furosemide 20 Mg  Tabs (Furosemide) .... Take One Tablet Once Daily 4)  Klor-Con M20 20 Meq  Tbcr (Potassium Chloride Crys Cr) .... Once Daily 5)  Metformin Hcl 500 Mg Xr24h-Tab (Metformin Hcl) .... 3 Tablets At Bedtime, 1 Tablet in Morning 6)  Prilosec Otc 20 Mg  Tbec (Omeprazole Magnesium) .Marland Kitchen.. 1 Once Daily 7)  Cvs Iron 325 (65 Fe) Mg Tabs (Ferrous Sulfate) .Marland Kitchen.. 1 Tablet By Mouth As Needed 8)  Vitamin D 50000 Unit Caps (Ergocalciferol) .... Every Other Week 9)  Warfarin Sodium 4 Mg Tabs (Warfarin Sodium) .... Take As Directed 10)  Warfarin Sodium 2 Mg Tabs (Warfarin Sodium) .... Take As Directed By Coumadin Clinic. 11)  Nascobal 500 Mcg/0.27ml Soln (Cyanocobalamin) .... One Puff in A Nostril Once A Week 12)  Micardis 20 Mg Tabs (Telmisartan) .... 1/2 Tab By Mouth Once Daily 13)  Metoprolol Succinate 100 Mg Xr24h-Tab (Metoprolol Succinate) .... Take One Tablet By Mouth Daily 14)  Prilosec 20 Mg Cpdr (Omeprazole) .Marland Kitchen.. 1 Tab By Mouth Once Daily 15)  Calcium 600 1500 Mg Tabs (Calcium Carbonate) .... Tab By Mouth Once Daily  Allergies (verified): No Known Drug Allergies  Anticoagulation Management History:      The patient is taking warfarin and comes in today for a routine follow up visit.   Positive risk factors for bleeding include an age of 69 years or older and presence of serious comorbidities.  Negative risk factors for bleeding include no history of CVA/TIA.  The bleeding index is 'intermediate risk'.  Positive CHADS2 values include History of HTN and History of Diabetes.  Negative CHADS2 values include Age > 48 years old and Prior Stroke/CVA/TIA.  The start date was 07/11/2006.  Her last INR was 2.6.  Anticoagulation responsible provider: Harrington Challenger MD, Nevin Bloodgood.  INR POC: 2.7.  Cuvette Lot#: [id number].  Exp: 06/2010.    Anticoagulation Management Assessment/Plan:      The patient's current anticoagulation dose is Warfarin sodium 4 mg tabs: Take as directed, Warfarin sodium 2 mg tabs: Take as directed by coumadin clinic..  The target INR is 2.0-3.0.  The next INR is due 06/16/2009.  Anticoagulation instructions were given to patient.  Results were reviewed/authorized by Judson Roch, PharmD.  She was notified by Judson Roch.         Prior Anticoagulation Instructions: The patient's dosage of coumadin will be increased.  The new dosage includes: Take 1/2 extra tablet tonight.  Then take 2 tablets on Sun, Tues, Thur, Sat, and 1 tablet on Mon, wed, fridays  Current Anticoagulation Instructions: INR = 2.7  The patient is to continue with the same dose of coumadin  2mg  Monday, Wednesday, Friday 4mg  Sunday, Tuesday, Thursday, Saturday  Prescriptions: WARFARIN SODIUM 4 MG TABS (WARFARIN SODIUM) Take as directed  #30 x 3   Entered by:   Judson Roch   Authorized by:   Colin Mulders, MD, [facility]   Signed by:   Judson Roch on 05/19/2009   Method used:   Print then Give to Patient   RxID:   [id number] WARFARIN SODIUM 4 MG TABS (WARFARIN SODIUM) Take as directed  #30 x 3   Entered by:   Judson Roch   Authorized by:   Colin Mulders, MD, [facility]   Signed by:   Judson Roch on 05/19/2009   Method used:   Electronically to        [id number]  (retail)       [address]       [place], [place]  [address]       Ph: [phone] or [phone]       Fax: [phone]   RxID:   [id number]

## 2010-04-09 NOTE — Assessment & Plan Note (Signed)
Summary: F6M/DM   Primary Provider:  Janith Lima MD  CC:  check up.  History of Present Illness: Pleasant female with history of severe AS, mitral regurgitation, aneurysm of the ascending thoracic aorta (status post aortic root replacement, mitral valve repair, resection and grafting of the ascending  thoracic aortic aneurysm on Jul 07, 2006). History of paroxysmal atrial fibrillation (status post maze  procedure on Jul 07, 2006). Followup catheterization performed secondary to elevated troponin and recurrent atrial fibrillation  revealed an ejection fraction of 25-30% and occlusion of the reimplanted left main.  The patient subsequently underwent redo coronary artery bypass and graft with a LIMA to the LAD and a saphenous vein graft to the OM. She also had an epicardial lead placed if she required biventricular pacing in the future. A followup echocardiogram was performed in March of 2011.  Her LV function was normal. The prosthetic aortic valve was functioning appropriately with only mild aortic insufficiency. There was mild mitral stenosis  following mitral valve repair. There was mild right atrial and right ventricular enlargement.  I last saw her in March 2011. Since then she has had 2 episodes of atrial fibrillation. The most recent lasted for approximately one hour and is described as her heart racing. There is no shortness of breath, chest pain, syncope or bleeding.  Current Medications (verified): 1)  Actos 45 Mg  Tabs (Pioglitazone Hcl) .... Once Daily 2)  Aspirin 81 Mg  Tbec (Aspirin) .... Once Daily 3)  Furosemide 20 Mg  Tabs (Furosemide) .... Take One Tablet Once Daily 4)  Klor-Con M20 20 Meq  Tbcr (Potassium Chloride Crys Cr) .... Once Daily 5)  Metformin Hcl 500 Mg Xr24h-Tab (Metformin Hcl) .... 3 Tablets At Bedtime, 1 Tablet in Morning 6)  Prilosec Otc 20 Mg  Tbec (Omeprazole Magnesium) .Marland Kitchen.. 1 Once Daily 7)  Cvs Iron 325 (65 Fe) Mg Tabs (Ferrous Sulfate) .Marland Kitchen.. 1 Tablet By Mouth As  Needed 8)  Vitamin D 50000 Unit Caps (Ergocalciferol) .... Every Other Week 9)  Warfarin Sodium 4 Mg Tabs (Warfarin Sodium) .... Take As Directed 10)  Warfarin Sodium 2 Mg Tabs (Warfarin Sodium) .... Take As Directed By Coumadin Clinic. 11)  Nascobal 500 Mcg/0.27ml Soln (Cyanocobalamin) .... One Puff in A Nostril Once A Week 12)  Micardis 20 Mg Tabs (Telmisartan) .... 1/2 Tab By Mouth Once Daily 13)  Metoprolol Succinate 100 Mg Xr24h-Tab (Metoprolol Succinate) .... Take 1.5 Tablet By Mouth Daily 14)  Prilosec 20 Mg Cpdr (Omeprazole) .Marland Kitchen.. 1 Tab By Mouth Once Daily 15)  Calcium 600 1500 Mg Tabs (Calcium Carbonate) .... Tab By Mouth Once Daily  Allergies: No Known Drug Allergies  Past History:  Past Medical History: Reviewed history from 05/06/2009 and no changes required. 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 Homograft aortic root replacement, MV repair due to severe MR,   resection and grafting of ascending thoracic aortic aneurysm and left sided maze in 2008. Redo Cabg 4/10 due to occluded LM at implantation site (LIMA to LAD, SVG to OM) FOOT SURGERY to remove neuroma  Social History: Reviewed history from 01/25/2008 and no changes required. Patient is a former smoker.  quit 1985 Alcohol Use - no Illicit Drug Use - no Patient does not get regular exercise.  Married   Review of Systems       no fevers or chills, productive cough,  hemoptysis, dysphasia, odynophagia, melena, hematochezia, dysuria, hematuria, rash, seizure activity, orthopnea, PND, pedal edema, claudication. Remaining systems are negative.   Vital Signs:  Patient profile:   70 year old female Height:      68 inches Weight:      190 pounds BMI:     28.99 Pulse rate:   75 / minute Resp:     16 per minute BP sitting:   125 / 65   (left arm)  Vitals Entered By: Burnett Kanaris (Jul 28, 2009 9:59 AM)  Physical Exam  General:  Well-developed well-nourished in no acute distress.  Skin is warm and dry.  HEENT is normal.  Neck is supple. No thyromegaly.  Chest is clear to auscultation with normal expansion.  Cardiovascular exam is regular rate and rhythm.  Abdominal exam nontender or distended. No masses palpated. Extremities show no edema. neuro grossly intact    EKG  Procedure date:  07/28/2009  Findings:      normal sinus rhythm at a rate of 76. Left axis deviation. No significant ST changes.  Impression & Recommendations:  Problem # 1:  MITRAL VALVE REPLACEMENT, HX OF (ICD-V15.1) Status post mitral valve repair. Followup echocardiogram in the future. Continue SBE prophylaxis.  Problem # 2:  ESSENTIAL HYPERTENSION, BENIGN (ICD-401.1)  Blood pressure controlled on present medications. Will continue. Her updated medication list for this problem includes:    Aspirin 81 Mg Tbec (Aspirin) ..... Once daily    Furosemide 20 Mg Tabs (Furosemide) .Marland Kitchen... Take one tablet once daily    Micardis 20 Mg Tabs (Telmisartan) .Marland Kitchen... 1/2 tab by mouth once daily    Metoprolol Succinate 100 Mg Xr24h-tab (Metoprolol succinate) .Marland Kitchen... Take 1.5 tablet by mouth daily  Her updated medication list for this problem includes:    Aspirin 81 Mg Tbec (Aspirin) ..... Once daily    Furosemide 20 Mg Tabs (Furosemide) .Marland Kitchen... Take one tablet once daily    Micardis 20 Mg Tabs (Telmisartan) .Marland Kitchen... 1/2 tab by mouth once daily    Metoprolol Succinate 100 Mg Xr24h-tab (Metoprolol succinate) .Marland Kitchen... Take 1.5 tablet by mouth daily  Problem # 3:  AORTIC VALVE REPLACEMENT, HX OF (ICD-V43.3) History of aortic valve replacement. Continue SBE prophylaxis.  Problem # 4:  COUMADIN THERAPY (ICD-V58.61) Monitored in the Coumadin clinic. Goal INR 2-3.  Problem # 5:  HYPERLIPIDEMIA (B2193296.4) Management per primary care.  Problem # 6:  DM  (ICD-250.00)  Her updated medication list for this problem includes:    Actos 45 Mg Tabs (Pioglitazone hcl) ..... Once daily    Aspirin 81 Mg Tbec (Aspirin) ..... Once daily    Metformin Hcl 500 Mg Xr24h-tab (Metformin hcl) .Marland KitchenMarland KitchenMarland KitchenMarland Kitchen 3 tablets at bedtime, 1 tablet in morning    Micardis 20 Mg Tabs (Telmisartan) .Marland Kitchen... 1/2 tab by mouth once daily  Her updated medication list for this problem includes:    Actos 45 Mg Tabs (Pioglitazone hcl) ..... Once daily    Aspirin 81 Mg Tbec (Aspirin) ..... Once daily    Metformin Hcl 500 Mg Xr24h-tab (Metformin hcl) .Marland KitchenMarland KitchenMarland KitchenMarland Kitchen 3 tablets at bedtime, 1 tablet in morning    Micardis 20 Mg Tabs (Telmisartan) .Marland Kitchen... 1/2 tab by mouth once daily  Problem # 7:  ATRIAL FIBRILLATION (ICD-427.31) She continues to have episodes of atrial fibrillation. We had long discussion today concerning options. I think she will require an antiarrhythmic to maintain sinus rhythm. She did not tolerate amiodarone previously. She has not had significant coronary disease but has had coronaries reimplanted previously. I  therefore think we should try flecainide 50 mg p.o. b.i.d. I will begin this when I returned from vacation in 3 weeks. We will schedule an exercise treadmill 5 days later to exclude exercise-induced ventricular tachycardia. If she has atrial fibrillation despite the above and I will schedule her to see Dr. Rayann Heman for consideration of ablation. Continue Coumadin. Continue beta blocker. Note echocardiogram showed normal LV function and TSH was normal. Her updated medication list for this problem includes:    Aspirin 81 Mg Tbec (Aspirin) ..... Once daily    Warfarin Sodium 4 Mg Tabs (Warfarin sodium) .Marland Kitchen... Take as directed    Warfarin Sodium 2 Mg Tabs (Warfarin sodium) .Marland Kitchen... Take as directed by coumadin clinic.    Metoprolol Succinate 100 Mg Xr24h-tab (Metoprolol succinate) .Marland Kitchen... Take 1.5 tablet by mouth daily    Flecainide Acetate 50 Mg Tabs (Flecainide acetate) .Marland Kitchen... Take 1 tablet  twice a day  Orders: EKG w/ Interpretation ([id number])  Her updated medication list for this problem includes:    Aspirin 81 Mg Tbec (Aspirin) ..... Once daily    Warfarin Sodium 4 Mg Tabs (Warfarin sodium) .Marland Kitchen... Take as directed    Warfarin Sodium 2 Mg Tabs (Warfarin sodium) .Marland Kitchen... Take as directed by coumadin clinic.    Metoprolol Succinate 100 Mg Xr24h-tab (Metoprolol succinate) .Marland Kitchen... Take 1.5 tablet by mouth daily    Flecainide Acetate 50 Mg Tabs (Flecainide acetate) .Marland Kitchen... Take 1 tablet twice a day  Patient Instructions: 1)  Your physician recommends that you schedule a follow-up appointment in: 3 months with Dr. Stanford Breed 2)  Your physician has recommended you make the following change in your medication: Flecainide do not start until receiving call back from Dr. Jacalyn Lefevre office. Prescriptions: FLECAINIDE ACETATE 50 MG TABS (FLECAINIDE ACETATE) Take 1 tablet twice a day  #60 x 6   Entered by:   Joelyn Oms RN   Authorized by:   Colin Mulders, MD, [facility]   Signed by:   Joelyn Oms RN on 07/28/2009   Method used:   Electronically to        [id number] (retail)       [address]       [place], [place]  [address]       Ph: [phone] or [phone]       Fax: [phone]   RxID:   [id number]

## 2010-04-09 NOTE — Medication Information (Signed)
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 no    Recent/future dental: yes     Details: fillings this week  Any missed doses?: no       Is patient compliant with meds? yes       Allergies: No Known Drug Allergies  Anticoagulation Management History:      The patient is taking warfarin and comes in today for a routine follow up visit.  Positive risk factors for bleeding include an age of 78 years or older and presence of serious comorbidities.  Negative risk factors for bleeding include no history of CVA/TIA.  The bleeding index is 'intermediate risk'.  Positive CHADS2 values include History of HTN and History of Diabetes.  Negative CHADS2 values include Age > 74 years old and Prior Stroke/CVA/TIA.  The start date was 07/11/2006.  Her last INR was 3.6.  Anticoagulation responsible provider: Angelena Form MD,Christopher.  INR POC: 2.1.  Cuvette Lot#: [id number].  Exp: 12/2010.    Anticoagulation Management Assessment/Plan:      The patient's current anticoagulation dose is Warfarin sodium 4 mg tabs: Take as directed, Warfarin sodium 2 mg tabs: Take as directed by coumadin clinic..  The target INR is 2.0-3.0.  The next INR is due 12/15/2009.  Anticoagulation instructions were given to patient.  Results were reviewed/authorized by Mammie Lorenzo, PharmD.         Prior Anticoagulation Instructions: INR 3.6 Hold dose for today. Do 1 tablet on wednesday instead of 2. Then go back to schedule of 2 tablets on sunday, 1 on monday, 2 tablets on tuesday, 1 on wednesday, 2 on thursdays, 1 on  friday, and 2 on saturday. See you in 1 week.  Current Anticoagulation Instructions: INR 2.1  Continue taking 2 tablets sunday, tuesday, thursday, and saturday. Take 1 tablet on monday and friday. See you in 4 weeks.

## 2010-04-09 NOTE — Medication Information (Signed)
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 no    Recent/future dental: no  Any missed doses?: no       Is patient compliant with meds? yes       Allergies: No Known Drug Allergies  Anticoagulation Management History:      The patient is taking warfarin and comes in today for a routine follow up visit.  Positive risk factors for bleeding include an age of 70 years or older and presence of serious comorbidities.  Negative risk factors for bleeding include no history of CVA/TIA.  The bleeding index is 'intermediate risk'.  Positive CHADS2 values include History of HTN and History of Diabetes.  Negative CHADS2 values include Age > 8 years old and Prior Stroke/CVA/TIA.  The start date was 07/11/2006.  Her last INR was 3.6.  Anticoagulation responsible provider: Nahser.  INR POC: 2.7.  Cuvette Lot#: [id number].  Exp: 01/2011.    Anticoagulation Management Assessment/Plan:      The patient's current anticoagulation dose is Warfarin sodium 4 mg tabs: Take as directed, Warfarin sodium 2 mg tabs: Take as directed by coumadin clinic..  The target INR is 2.0-3.0.  The next INR is due 02/09/2010.  Anticoagulation instructions were given to patient.  Results were reviewed/authorized by Porfirio Oar, PharmD.  She was notified by Porfirio Oar PharmD.         Prior Anticoagulation Instructions: INR 2.6  Take Coumadin 2 tablets every day of the week except 1 tablet on Monday and Friday.  Return to clinic in 4 weeks.    Current Anticoagulation Instructions: INR 2.7  Continue same dose of 4mg  every day except  2mg  on Monady and Friday.  Recheck INR in 4 weeks.

## 2010-04-09 NOTE — Medication Information (Signed)
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  It has lasted 2 weeks, but is slowly improving.   Recent/future hospitalizations: no    Any changes in medication regimen? no    Recent/future dental: no  Any missed doses?: no       Is patient compliant with meds? yes      Comments: Patient has been taking Coumadin 2 tablets every day of the week except 1 tablet on Monday and Friday.   Allergies: No Known Drug Allergies  Anticoagulation Management History:      The patient is taking warfarin and comes in today for a routine follow up visit.  Positive risk factors for bleeding include an age of 70 years or older and presence of serious comorbidities.  Negative risk factors for bleeding include no history of CVA/TIA.  The bleeding index is 'intermediate risk'.  Positive CHADS2 values include History of HTN and History of Diabetes.  Negative CHADS2 values include Age > 33 years old and Prior Stroke/CVA/TIA.  The start date was 07/11/2006.  Her last INR was 3.6.  Anticoagulation responsible [place]: Percival Spanish MD, Jeneen Rinks.  INR POC: 2.6.  Cuvette Lot#: [id number].  Exp: 01/2011.    Anticoagulation Management Assessment/Plan:      The patient's current anticoagulation dose is Warfarin sodium 4 mg tabs: Take as directed, Warfarin sodium 2 mg tabs: Take as directed by coumadin clinic..  The target INR is 2.0-3.0.  The next INR is due 01/12/2010.  Anticoagulation instructions were given to patient.  Results were reviewed/authorized by Porfirio Oar, PharmD.  She was notified by Ralene Bathe,  PharmD Candidate.         Prior Anticoagulation Instructions: INR 2.1  Continue taking 2 tablets sunday, tuesday, thursday, and saturday. Take 1 tablet on monday and friday. See you in 4 weeks.  Current Anticoagulation Instructions: INR 2.6  Take Coumadin 2 tablets every day of the week except 1 tablet on Monday and Friday.  Return to clinic in 4 weeks.

## 2010-04-09 NOTE — Letter (Signed)
Summary: Results Follow-up Letter  [facility]   [place], [place] [address]   Phone: [phone]  Fax: [phone]    05/20/2009  [place], [place]  [id number]  Dear Ms. Mounce,   The following are the results of your recent test(s):  Test     Result     Vitamin D=36   normal Blood sugar=126   slightly high A1C=6.1     good average blood sugar Liver/kidney   normal CBC       mild anemia and slightly high platelets Thyroid     normal Vitamin B12     normal Iron level     normal  _________________________________________________________  Please call for an appointment as directed _________________________________________________________ _________________________________________________________ _________________________________________________________  Sincerely,  Scarlette Calico MD [facility]

## 2010-04-09 NOTE — Medication Information (Signed)
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 yes       Details: Flecinide started 50mg s BID on 09/06/09   Any missed doses?: no       Is patient compliant with meds? yes       Allergies: No Known Drug Allergies  Anticoagulation Management History:      The patient is taking warfarin and comes in today for a routine follow up visit.  Positive risk factors for bleeding include an age of 71 years or older and presence of serious comorbidities.  Negative risk factors for bleeding include no history of CVA/TIA.  The bleeding index is 'intermediate risk'.  Positive CHADS2 values include History of HTN and History of Diabetes.  Negative CHADS2 values include Age > 63 years old and Prior Stroke/CVA/TIA.  The start date was 07/11/2006.  Her last INR was 2.6.  Anticoagulation responsible provider: Caryl Comes MD, Remo Lipps.  INR POC: 2.0.  Cuvette Lot#: [id number].  Exp: 11/2010.    Anticoagulation Management Assessment/Plan:      The patient's current anticoagulation dose is Warfarin sodium 4 mg tabs: Take as directed, Warfarin sodium 2 mg tabs: Take as directed by coumadin clinic..  The target INR is 2.0-3.0.  The next INR is due 09/22/2009.  Anticoagulation instructions were given to patient.  Results were reviewed/authorized by Tula Nakayama, RN, BSN.  She was notified by Tula Nakayama, RN, BSN.         Prior Anticoagulation Instructions: INR 2.2  Continue same dose of 4mg  daily except 2 mg on Monday, Wednesday and Friday   Current Anticoagulation Instructions: INR 2.0 Change dose to 4mg s everyday except 2mg s on Mondays and Fridays. Recheck in 2 weeks.

## 2010-04-09 NOTE — Medication Information (Signed)
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  Cardiologist aware  Bleeding/hemorrhagic complications: no    Recent/future hospitalizations: no    Any changes in medication regimen? no    Recent/future dental: no  Any missed doses?: no       Is patient compliant with meds? yes       Allergies: No Known Drug Allergies  Anticoagulation Management History:      The patient is taking warfarin and comes in today for a routine follow up visit.  Positive risk factors for bleeding include an age of 16 years or older and presence of serious comorbidities.  Negative risk factors for bleeding include no history of CVA/TIA.  The bleeding index is 'intermediate risk'.  Positive CHADS2 values include History of HTN and History of Diabetes.  Negative CHADS2 values include Age > 46 years old and Prior Stroke/CVA/TIA.  The start date was 07/11/2006.  Her last INR was 2.6.  Anticoagulation responsible provider: Percival Spanish MD, Jeneen Rinks.  INR POC: 2.2.  Cuvette Lot#: [id number].  Exp: 10/2010.    Anticoagulation Management Assessment/Plan:      The patient's current anticoagulation dose is Warfarin sodium 4 mg tabs: Take as directed, Warfarin sodium 2 mg tabs: Take as directed by coumadin clinic..  The target INR is 2.0-3.0.  The next INR is due 09/09/2009.  Anticoagulation instructions were given to patient.  Results were reviewed/authorized by Porfirio Oar, PharmD.  She was notified by Porfirio Oar PharmD.         Prior Anticoagulation Instructions: INR 2.3 Continue 4mg  daily except 2mg s on Mondays, Wednesdays and Fridays. Recheck in 4 weeks.   Current Anticoagulation  Instructions: INR 2.2  Continue same dose of 4mg  daily except 2 mg on Monday, Wednesday and Friday

## 2010-04-09 NOTE — Medication Information (Signed)
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 no    Recent/future dental: no  Any missed doses?: no       Is patient compliant with meds? yes       Allergies: No Known Drug Allergies  Anticoagulation Management History:      The patient is taking warfarin and comes in today for a routine follow up visit.  Positive risk factors for bleeding include an age of 70 years or older and presence of serious comorbidities.  Negative risk factors for bleeding include no history of CVA/TIA.  The bleeding index is 'intermediate risk'.  Positive CHADS2 values include History of HTN and History of Diabetes.  Negative CHADS2 values include Age > 78 years old and Prior Stroke/CVA/TIA.  The start date was 07/11/2006.  Her last INR was 2.6.  Anticoagulation responsible provider: Percival Spanish MD, Jeneen Rinks.  INR POC: 2.6.  Cuvette Lot#: [id number].  Exp: 12/2010.    Anticoagulation Management Assessment/Plan:      The patient's current anticoagulation dose is Warfarin sodium 4 mg tabs: Take as directed, Warfarin sodium 2 mg tabs: Take as directed by coumadin clinic..  The target INR is 2.0-3.0.  The next INR is due 10/13/2009.  Anticoagulation instructions were given to patient.  Results were reviewed/authorized by Freddrick March, RN, BSN.  She was notified by Freddrick March RN.         Prior Anticoagulation Instructions: INR 2.0 Change dose to 4mg s everyday except 2mg s on Mondays and Fridays. Recheck in 2 weeks.   Current Anticoagulation Instructions: INR 2.6  Continue on same dosage 2 tablets daily  except 1 tablet on Mondays and Fridays.  Recheck in 3 weeks.

## 2010-04-09 NOTE — Medication Information (Signed)
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 no    Recent/future dental: no  Any missed doses?: no       Is patient compliant with meds? yes       Current Medications (verified): 1)  Actos 45 Mg  Tabs (Pioglitazone Hcl) .... Once Daily 2)  Aspirin 81 Mg  Tbec (Aspirin) .... Once Daily 3)  Furosemide 20 Mg  Tabs (Furosemide) .... Take One Tablet Once Daily 4)  Klor-Con M20 20 Meq  Tbcr (Potassium Chloride Crys Cr) .... Once Daily 5)  Metformin Hcl 500 Mg Xr24h-Tab (Metformin Hcl) .... 3 Tablets At Bedtime, 1 Tablet in Morning 6)  Prilosec Otc 20 Mg  Tbec (Omeprazole Magnesium) .Marland Kitchen.. 1 Once Daily 7)  Cvs Iron 325 (65 Fe) Mg Tabs (Ferrous Sulfate) .Marland Kitchen.. 1 Tablet By Mouth As Needed 8)  Vitamin D 50000 Unit Caps (Ergocalciferol) .... Weekly 9)  Warfarin Sodium 4 Mg Tabs (Warfarin Sodium) .... Take As Directed 10)  Warfarin Sodium 2 Mg Tabs (Warfarin Sodium) .... Take As Directed By Coumadin Clinic. 11)  Nascobal 500 Mcg/0.20ml Soln (Cyanocobalamin) .... One Puff in A Nostril Once A Week 12)  Micardis 20 Mg Tabs (Telmisartan) .... 1/2 Tab By Mouth Once Daily  Allergies (verified): No Known Drug Allergies  Anticoagulation Management History:      Positive risk factors for bleeding include an age of 89 years or older and presence of serious comorbidities.  Negative risk factors for bleeding include no history of CVA/TIA.  The bleeding index is 'intermediate risk'.  Positive CHADS2 values include History of HTN and History of Diabetes.  Negative CHADS2 values include Age > 33  years old and Prior Stroke/CVA/TIA.  The start date was 07/11/2006.  Her last INR was 2.6.  Anticoagulation responsible provider: Aundra Dubin MD, Lillyian Heidt.  INR POC: 1.6.  Exp: 06/2010.    Anticoagulation Management Assessment/Plan:      The patient's current anticoagulation dose is Warfarin sodium 4 mg tabs: Take as directed, Warfarin sodium 2 mg tabs: Take as directed by coumadin clinic..  The target INR is 2.0-3.0.  The next INR is due 05/19/2009.  Anticoagulation instructions were given to patient.  Results were reviewed/authorized by Roxanne Gates, PharmD.         Prior Anticoagulation Instructions: INR: 2.3 Continue with same dosage of 2mg  daily except 4mg  on Tuesdays, Thursdays and Saturdays Recheck in 4 weeks  Current Anticoagulation Instructions: The patient's dosage of coumadin will be increased.  The new dosage includes: Take 1/2 extra tablet tonight.  Then take 2 tablets on Sun, Tues, Thur, Sat, and 1 tablet on Mon, wed, fridays

## 2010-04-09 NOTE — Assessment & Plan Note (Signed)
Summary: 4 mth fu  stc   Vital Signs:  Patient profile:   70 year old female Height:      68 inches Weight:      188.75 pounds BMI:     28.80 O2 Sat:      97 % on Room air Temp:     98.0 degrees F oral Pulse rate:   66 / minute Pulse rhythm:   regular Resp:     16 per minute BP sitting:   130 / 82  (left arm) Cuff size:   large  Vitals Entered By: Estell Harpin CMA (September 24, 2009 8:29 AM)  O2 Flow:  Room air CC: follow up// discuss vit B12 options Is Patient Diabetic? Yes Did you bring your meter with you today? No Pain Assessment Patient in pain? no        Primary Care Provider:  Janith Lima MD  CC:  follow up// discuss vit B12 options.  History of Present Illness:  Follow-Up Visit      This is a 70 year old woman who presents for Follow-up visit.  The patient denies chest pain, palpitations, dizziness, syncope, low blood sugar symptoms, high blood sugar symptoms, edema, SOB, DOE, PND, and orthopnea.  Since the last visit the patient notes no new problems or concerns.  The patient reports taking meds as prescribed, monitoring BP, monitoring blood sugars, and dietary compliance.  When questioned about possible medication side effects, the patient notes fatigue.    Preventive Screening-Counseling & Management  Alcohol-Tobacco     Alcohol drinks/day: 0     Smoking Status: quit     Tobacco Counseling: to remain off tobacco products  Hep-HIV-STD-Contraception     Hepatitis Risk: no risk noted     HIV Risk: no risk noted     STD Risk: no risk noted  Current Medications (verified): 1)  Actos 45 Mg  Tabs (Pioglitazone Hcl) .... Once Daily 2)  Aspirin 81 Mg  Tbec (Aspirin) .... Once Daily 3)  Furosemide 20 Mg  Tabs (Furosemide) .... Take One Tablet Once Daily 4)  Klor-Con M20 20 Meq  Tbcr (Potassium Chloride Crys Cr) .... Once Daily 5)  Metformin Hcl 500 Mg Xr24h-Tab (Metformin Hcl) .... 3 Tablets At Bedtime, 1 Tablet in Morning 6)  Prilosec Otc 20 Mg  Tbec  (Omeprazole Magnesium) .Marland Kitchen.. 1 Once Daily 7)  Cvs Iron 325 (65 Fe) Mg Tabs (Ferrous Sulfate) .Marland Kitchen.. 1 Tablet By Mouth As Needed 8)  Vitamin D 50000 Unit Caps (Ergocalciferol) .... Every Other Week 9)  Warfarin Sodium 4 Mg Tabs (Warfarin Sodium) .... Take As Directed 10)  Warfarin Sodium 2 Mg Tabs (Warfarin Sodium) .... Take As Directed By Coumadin Clinic. 11)  Nascobal 500 Mcg/0.57ml Soln (Cyanocobalamin) .... One Puff in A Nostril Once A Week 12)  Micardis 20 Mg Tabs (Telmisartan) .... 1/2 Tab By Mouth Once Daily 13)  Metoprolol Succinate 100 Mg Xr24h-Tab (Metoprolol Succinate) .... Take 1.5 Tablet By Mouth Daily 14)  Prilosec 20 Mg Cpdr (Omeprazole) .Marland Kitchen.. 1 Tab By Mouth Once Daily 15)  Calcium 600 1500 Mg Tabs (Calcium Carbonate) .... Tab By Mouth Once Daily 16)  Flecainide Acetate 50 Mg Tabs (Flecainide Acetate) .... 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 Redo Cabg 4/10 due to occluded LM at implantation site (LIMA to LAD, SVG to OM) FOOT SURGERY to remove neuroma  Family History: Last updated: 08/09/2007 Family History of Colon Cancer: grandmother, uncle Family History of Breast Cancer: aunt, cousin Family History of Diabetes: uncle, grandmother,uncle  Social History: Last updated: 01/25/2008 Patient is a former smoker.  quit 1985 Alcohol Use - no Illicit Drug Use - no Patient does not get regular exercise.  Married   Risk Factors: Alcohol Use: 0 (09/24/2009) Exercise: no (08/09/2007)  Risk Factors: Smoking Status: quit  (09/24/2009)  Family History: Reviewed history from 08/09/2007 and no changes required. Family History of Colon Cancer: grandmother, uncle Family History of Breast Cancer: aunt, cousin Family History of Diabetes: uncle, grandmother,uncle  Social History: Reviewed history from 01/25/2008 and no changes required. Patient is a former smoker.  quit 1985 Alcohol Use - no Illicit Drug Use - no Patient does not get regular exercise.  Married  Hepatitis Risk:  no risk noted HIV Risk:  no risk noted STD Risk:  no risk noted  Review of Systems       The patient complains of weight gain.  The patient denies anorexia, fever, weight loss, chest pain, syncope, dyspnea on exertion, peripheral edema, prolonged cough, headaches, hemoptysis, abdominal pain, difficulty walking, and depression.   CV:  Denies chest pain or discomfort, fainting, fatigue, lightheadness, near fainting, palpitations, shortness of breath with exertion, and swelling of feet. Endo:  Denies cold intolerance, excessive hunger, excessive thirst, excessive urination, heat intolerance, polyuria, and weight change.  Physical Exam  General:  well-developed well-nourished in no acute distress. Skin is warm and dry.overweight-appearing.   Mouth:  Oral mucosa and oropharynx without lesions or exudates.  Teeth in good repair. Neck:  supple, full ROM, no masses, normal carotid upstroke, no carotid bruits, and no cervical lymphadenopathy.   Lungs:  Normal respiratory effort, chest expands symmetrically. Lungs are clear to auscultation, no crackles or wheezes. Heart:  regular rate and rhythm with [id number] systolic murmur left sternal border and right 2nd ICS with radiation to carotids. No diastolic murmur noted. no gallop, no rub, and no JVD.   Abdomen:  Bowel sounds positive,abdomen soft and non-tender without masses, organomegaly or hernias noted.  Msk:  normal ROM, no joint tenderness, no joint swelling, and no joint warmth.   Pulses:  R  and L carotid,radial,femoral,dorsalis pedis and posterior tibial pulses are full and equal bilaterally Extremities:  No clubbing, cyanosis, edema, or deformity noted with normal full range of motion of all joints.   Neurologic:  No cranial nerve deficits noted. Station and gait are normal. Plantar reflexes are down-going bilaterally. DTRs are symmetrical throughout. Sensory, motor and coordinative functions appear intact. Skin:  turgor normal, color normal, no rashes, no suspicious lesions, no ecchymoses, no petechiae, no purpura, no ulcerations, and no edema.   Cervical Nodes:  no anterior cervical adenopathy and no posterior cervical adenopathy.   Axillary Nodes:  no R axillary adenopathy and no L axillary adenopathy.   Psych:  Cognition and judgment appear intact. Alert and cooperative with normal attention span and concentration. No apparent delusions, illusions, hallucinations  Diabetes Management Exam:    Foot Exam (with socks and/or shoes not present):       Sensory-Pinprick/Light touch:          Left medial foot (L-4): normal  Left dorsal foot (L-5): normal          Left lateral foot (S-1): normal          Right medial foot (L-4): normal          Right dorsal foot (L-5): normal          Right lateral foot (S-1): normal       Sensory-Monofilament:          Left foot: normal          Right foot: normal       Inspection:          Left foot: normal          Right foot: normal       Nails:          Left foot: normal          Right foot: normal   Impression & Recommendations:  Problem # 1:  ESSENTIAL HYPERTENSION, BENIGN (ICD-401.1) Assessment Improved  Her updated medication list for this problem includes:    Furosemide 20 Mg Tabs (Furosemide) .Marland Kitchen... Take one tablet once daily    Micardis 20 Mg Tabs (Telmisartan) .Marland Kitchen... 1/2 tab by mouth once daily    Metoprolol Succinate 100 Mg Xr24h-tab (Metoprolol succinate) .Marland Kitchen... Take 1.5 tablet by mouth daily  Orders: Venipuncture  [id number]) TLB-Lipid Panel (80061-LIPID) TLB-BMP (Basic Metabolic Panel-BMET) (99991111) TLB-Hepatic/Liver Function Pnl (80076-HEPATIC) TLB-TSH (Thyroid Stimulating Hormone) (84443-TSH) TLB-A1C / Hgb A1C (Glycohemoglobin) (83036-A1C)  BP today: 130/82 Prior BP: 135/66 (09/11/2009)  Prior 10 Yr Risk Heart Disease: N/A (10/17/2008)  Labs Reviewed: K+: 4.5 (05/15/2009) Creat: : 0.8 (05/15/2009)   Chol: 227 (10/17/2008)   HDL: 66.00 (10/17/2008)   LDL: 66 (04/17/2007)   TG: 125.0 (10/17/2008)  Problem # 2:  HYPERLIPIDEMIA (ICD-272.4) Assessment: Deteriorated to date, she has decided she does not want to take a statin, she was previously on Lipitor and tolerated it well but stopped it b/c she felt like she was taking too many meds. Orders: Venipuncture [id number]) TLB-Lipid Panel (80061-LIPID) TLB-BMP (Basic Metabolic Panel-BMET) (99991111) TLB-Hepatic/Liver Function Pnl (80076-HEPATIC) TLB-TSH (Thyroid Stimulating Hormone) (84443-TSH) TLB-A1C / Hgb A1C (Glycohemoglobin) (83036-A1C)  Problem # 3:  DM (ICD-250.00) Assessment: Unchanged  Her updated medication list for this problem includes:    Actos 45 Mg Tabs (Pioglitazone hcl) ..... Once daily    Aspirin 81 Mg Tbec (Aspirin) ..... Once daily    Metformin Hcl 500 Mg Xr24h-tab (Metformin hcl) .Marland KitchenMarland KitchenMarland KitchenMarland Kitchen 3 tablets at bedtime, 1 tablet in morning    Micardis 20 Mg Tabs (Telmisartan) .Marland Kitchen... 1/2 tab by mouth once daily  Labs Reviewed: Creat: 0.8 (05/15/2009)     Last Eye Exam: normal (11/18/2008) Reviewed HgBA1c results: 6.1 (05/15/2009)  6.0 (10/17/2008)  Problem # 4:  ATRIAL FIBRILLATION (ICD-427.31) Assessment: Improved  Her updated medication list for this problem includes:    Aspirin 81 Mg Tbec (Aspirin) ..... Once daily    Warfarin Sodium 4 Mg Tabs (Warfarin sodium) .Marland Kitchen... Take as directed    Warfarin Sodium 2 Mg Tabs (Warfarin sodium) .Marland Kitchen... Take as directed by coumadin clinic.    Metoprolol Succinate 100 Mg Xr24h-tab  (Metoprolol succinate) .Marland Kitchen... Take 1.5 tablet by mouth daily    Flecainide Acetate 50 Mg Tabs (Flecainide acetate) .Marland Kitchen... Take 1 tablet twice a day  Reviewed the following: PT: 17.3 (10/01/2008)   INR: 2.6 (10/16/2008) Coumadin Dose (weekly): 24 mg (09/22/2009) Prior Coumadin Dose (weekly): 24 mg (09/22/2009) Next Protime: 10/13/2009 (dated on 09/22/2009)  Complete Medication List: 1)  Actos 45 Mg Tabs (Pioglitazone hcl) .... Once daily 2)  Aspirin 81 Mg Tbec (Aspirin) .... Once daily 3)  Furosemide 20 Mg Tabs (Furosemide) .... Take one tablet once daily 4)  Klor-con M20 20 Meq Tbcr (Potassium chloride crys cr) .... Once daily 5)  Metformin Hcl 500 Mg Xr24h-tab (Metformin hcl) .... 3 tablets at bedtime, 1 tablet in morning 6)  Prilosec Otc 20 Mg Tbec (Omeprazole magnesium) .Marland Kitchen.. 1 once daily 7)  Cvs Iron 325 (65 Fe) Mg Tabs (Ferrous sulfate) .Marland Kitchen.. 1 tablet by mouth as needed 8)  Vitamin D 50000 Unit Caps (Ergocalciferol) .... One by mouth each week 9)  Warfarin Sodium 4 Mg Tabs (Warfarin sodium) .... Take as directed 10)  Warfarin Sodium 2 Mg Tabs (Warfarin sodium) .... Take as directed by coumadin clinic. 11)  Nascobal 500 Mcg/0.37ml Soln (Cyanocobalamin) .... One puff in a nostril once a week 12)  Micardis 20 Mg Tabs (Telmisartan) .... 1/2 tab by mouth once daily 13)  Metoprolol Succinate 100 Mg Xr24h-tab (Metoprolol succinate) .... Take 1.5 tablet by mouth daily 14)  Prilosec 20 Mg Cpdr (Omeprazole) .Marland Kitchen.. 1 tab by mouth once daily 15)  Calcium 600 1500 Mg Tabs (Calcium carbonate) .... Tab by mouth once daily 16)  Flecainide Acetate 50 Mg Tabs (Flecainide acetate) .... Take 1 tablet twice a day  Colorectal Screening:  Current Recommendations:    Hemoccult: patient refused    Colonoscopy recommended: patient refused  Colonoscopy Results:    Date of Exam: 11/15/2006    Results: Normal  PAP Screening:    Hx Cervical Dysplasia in last 5 yrs? No    3 normal PAP smears in last 5 yrs?  Yes    Last PAP smear:  11/18/2008  PAP Smear Results:    Date of Exam:  11/18/2008    Results:  Normal  PAP Smear Comments:    [place]  Mammogram Screening:    Last Mammogram:  02/17/2009  Mammogram Results:    Date of Exam:  02/17/2009    Results:  Normal Bilateral  Osteoporosis Risk Assessment:  Risk Factors for Fracture or Low Bone Density:   Race (White or Asian):     yes   Hx of Fractures:       no   FH of Osteoporosis:     yes   Hx of falls:       no   Physically inactive:     no   Smoking status:       quit   High alcohol use:     no   High caffeine use:     no   Low calcium/Vit. D intake:   yes   Corticosteroid use:     no   Thyroid replacement:     no   Dilantin use:       no   Heparin use:       no   Thyroid disease:     no   Rheumatoid Arthritis:     no   Parathyroid disease:     no  Bone Density (DEXA Scan) Results:    Date of Exam:  11/15/2006    Results:  Borderline Osteoporosis  Immunization & Chemoprophylaxis:    Influenza vaccine: Fluvax 3+  (01/03/2009)    Pneumovax: Pneumovax  (01/03/2009)  Patient Instructions: 1)  Please schedule a follow-up appointment in 4 months. 2)  It is important that you exercise regularly at least 20 minutes 5 times a week. If you develop  chest pain, have severe difficulty breathing, or feel very tired , stop exercising immediately and seek medical attention. 3)  You need to lose weight. Consider a lower calorie diet and regular exercise.  4)  Check your blood sugars regularly. If your readings are usually above 200 or below 70 you should contact our office. 5)  It is important that your Diabetic A1c level is checked every 3 months. 6)  See your eye doctor yearly to check for diabetic eye damage. 7)  Check your feet each night for sore areas, calluses or signs of infection. 8)  Check your Blood Pressure regularly. If it is above 130/80: you should make an appointment. Prescriptions: VITAMIN D 83151 UNIT CAPS  (ERGOCALCIFEROL) one by mouth each week  #4 x 11   Entered and Authorized by:   Janith Lima MD   Signed by:   Janith Lima MD on 09/24/2009   Method used:   Electronically to        [id number] (retail)       [address]       [place], [place]  [id number]       Ph: [phone] or [phone]       Fax: [phone]   [id number]    Not Administered:    Tetanus Vaccine not given due to: declined    Influenza Vaccine not given due to: vaccine availability

## 2010-04-09 NOTE — Progress Notes (Signed)
Summary: Pt in a-fib  Phone Note Call from Patient Call back at Home Phone [phone]   Caller: Patient Summary of Call: Ptr is in A-Fib started about 30 min ago Initial call taken by: Delsa Sale,  Jul 16, 2009 9:39 AM  Follow-up for Phone Call        spoke with pt, she took her metoprolol about 8:55am and the atrial fib started around 9am. her heart rate is 160.  she denies chest pain or SOB, c/o fatique. will discuss with dr Stanford Breed Fredia Beets, RN  Jul 16, 2009 9:46 AM  discussed with dr Stanford Breed, pt will need to go to the er if heart rate still elevaed. called pt and she was back in regular rhythm. she has a follow up scheduled Fredia Beets, RN  Jul 16, 2009 11:00 AM

## 2010-04-09 NOTE — Letter (Signed)
Summary: ER Notification  Press photographer at [address] [facility], [place] [address]   Phone: [phone]  Fax:     October 29, 2009 8:47 AM  Melynda Ripple  The above referenced patient has been advised to report directly to the Emergency Room. Please see below for more information:  Dx: ATRIAL FIB WITH RVR     Private Vehicle  ___XX____________ or EMS:  ________________   Orders:  Yes ______ or No  __XX_____   Notify upon arrival:     Trish [phone]       Or _________________   Thank you,    [facility] Staff

## 2010-04-09 NOTE — Progress Notes (Signed)
Summary: refill**NEW Pharmacy**  Phone Note Refill Request   Refills Requested: Medication #1:  WARFARIN SODIUM 2 MG TABS Take as directed by coumadin clinic.   Supply Requested: [facility] months [facility] [address]   Method Requested: Fax to [facility] Initial call taken by: Darnell Level,  May 16, 2009 11:18 AM    Prescriptions: WARFARIN SODIUM 2 MG TABS (WARFARIN SODIUM) Take as directed by coumadin clinic.  #30 x 3   Entered by:   Tula Nakayama, RN, BSN   Authorized by:   Colin Mulders, MD, [facility]   Signed by:   Tula Nakayama, RN, BSN on 05/16/2009   Method used:   Electronically to        [id number] (retail)       [address]       [place], [place]  [address]       Ph: [phone] or [phone]       Fax: [phone]   RxID:   [id number]

## 2010-04-09 NOTE — Consult Note (Signed)
Summary: [facility]   Imported By: Marilynne Drivers 08/13/2009 11:04:02  _____________________________________________________________________  External Attachment:    Type:   Image     Comment:   External Document

## 2010-04-09 NOTE — Letter (Signed)
Summary: [facility]   [facility]   Imported By: Sallee Provencal 05/15/2009 15:07:13  _____________________________________________________________________  External Attachment:    Type:   Image     Comment:   External Document

## 2010-04-09 NOTE — Progress Notes (Signed)
Summary: question regarding treadmill  Phone Note Call from Patient Call back at Home Phone [phone]   Caller: Patient Reason for Call: Talk to Nurse Summary of Call: has question regarding treadmill on 7/7.pt aware that Hilda Blades is off today.  Initial call taken by: Neil Crouch,  September 02, 2009 10:56 AM  Follow-up for Phone Call        09/02/09--1130am--pt calling with ? Twilight wether to take metroprolol before GXT --pt is now on 150mg  flecainide and did not know if she should take both meds before GXT --advised to take all meds before procedure--pt agrees Follow-up by: Leodis Sias, RN,  September 02, 2009 11:38 AM     Appended Document: question regarding treadmill Please make sure patient is taking her flecanide at 50 mg by mouth bid  Appended Document: question regarding treadmill confirmed with pt, she will take 50mg  of flecainide twice daily

## 2010-04-09 NOTE — Progress Notes (Signed)
Summary: c/o heart rate in 100s today  Phone Note Call from Patient Call back at Home Phone [phone]   Caller: Patient Reason for Call: Talk to Nurse Summary of Call: Per pt calling, pt saw bc on yesterday, has a question about hr . c/o hr today 103 -115.  Initial call taken by: Neil Crouch,  October 28, 2009 2:55 PM  Follow-up for Phone Call        spoke with pt, she has been in atrial fib since about 8am this am. her heart rate is running around 103-115. she took her metoprolol 125mg  this am and has taken an extra 25mg  at noon with no change. she wonders what to take next or what to do. will discuss with dr Stanford Breed. Fredia Beets, RN  October 28, 2009 3:07 PM  discussed with dr Stanford Breed, pt instructed to take extra flecainide now and will then take her evening dose and give it more time. if she starts feeling bad or has no decrease in her pulse, she will go to the er for eval Fredia Beets, RN  October 28, 2009 3:46 PM

## 2010-04-09 NOTE — Progress Notes (Signed)
  Recieved Records from [facility], over fax forwarded Records over to [facility]( covering for [place] today)  [facility] [place]  May 02, 2009 3:10 PM

## 2010-04-09 NOTE — Assessment & Plan Note (Signed)
Summary: 3 month rov/sl   Primary Provider:  Janith Lima MD   History of Present Illness: Pleasant female with history of severe AS, mitral regurgitation, aneurysm of the ascending thoracic aorta (status post aortic valve replacement, mitral valve repair, resection and grafting of the ascending  thoracic aortic aneurysm on Jul 07, 2006). History of paroxysmal atrial fibrillation (status post maze  procedure on Jul 07, 2006). Followup catheterization performed secondary to elevated troponin and recurrent atrial fibrillation  revealed an ejection fraction of 25-30% and occlusion of the reimplanted left main.  The patient subsequently underwent redo coronary artery bypass and graft with a LIMA to the LAD and a saphenous vein graft to the OM. She also had an epicardial lead placed if she required biventricular pacing in the future. A followup echocardiogram was performed in March of 2011.  Her LV function was normal. The prosthetic aortic valve was functioning appropriately with only mild aortic insufficiency. There was mild mitral stenosis  following mitral valve repair. There was mild right atrial and right ventricular enlargement.  I last saw her in May 2011. We begin flecainide at that time for recurrent atrial fibrillation. A followup exercise treadmill in July of 2011 showed no exercise induced ventricular tachycardia. Since then she has had no recurrent episodes of atrial fibrillation. There is no dyspnea or chest pain. There is no syncope or bleeding.  Current Medications (verified): 1)  Actos 45 Mg  Tabs (Pioglitazone Hcl) .... Once Daily 2)  Aspirin 81 Mg  Tbec (Aspirin) .... Once Daily 3)  Furosemide 20 Mg  Tabs (Furosemide) .... Take One Tablet Once Daily 4)  Klor-Con M20 20 Meq  Tbcr (Potassium Chloride Crys Cr) .... Once Daily 5)  Metformin Hcl 500 Mg Xr24h-Tab (Metformin Hcl) .... 3 Tablets At Bedtime, 1 Tablet in Morning 6)  Prilosec Otc 20 Mg  Tbec (Omeprazole Magnesium) .Marland Kitchen.. 1 Once  Daily 7)  Cvs Iron 325 (65 Fe) Mg Tabs (Ferrous Sulfate) .Marland Kitchen.. 1 Tablet By Mouth As Needed 8)  Vitamin D 50000 Unit Caps (Ergocalciferol) .... Every Monday 9)  Warfarin Sodium 4 Mg Tabs (Warfarin Sodium) .... Take As Directed 10)  Warfarin Sodium 2 Mg Tabs (Warfarin Sodium) .... Take As Directed By Coumadin Clinic. 11)  Nascobal 500 Mcg/0.35ml Soln (Cyanocobalamin) .... One Puff in A Nostril Once A Week 12)  Micardis 20 Mg Tabs (Telmisartan) .... 1/2 Tab By Mouth Once Daily 13)  Metoprolol Succinate 100 Mg Xr24h-Tab (Metoprolol Succinate) .... Take 1.5 Tablet By Mouth Daily 14)  Prilosec 20 Mg Cpdr (Omeprazole) .Marland Kitchen.. 1 Tab By Mouth Once Daily 15)  Calcium 600 1500 Mg Tabs (Calcium Carbonate) .... Tab By Mouth Once Daily 16)  Flecainide Acetate 50 Mg Tabs (Flecainide Acetate) .... Take 1 Tablet Twice A Day  Allergies: No Known Drug Allergies  Past History:  Past Medical History: Reviewed history from 05/06/2009 and no changes required. 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 Homograft aortic root replacement, MV repair due to severe MR,   resection and grafting of ascending thoracic aortic aneurysm and left sided maze in 2008. Redo Cabg 4/10 due to occluded LM at implantation site (LIMA to LAD, SVG to OM) FOOT SURGERY to remove neuroma  Social History: Reviewed history from 01/25/2008 and no changes required. Patient is a former smoker.  quit 1985 Alcohol Use - no Illicit Drug Use - no  Patient does not get regular exercise.  Married   Review of Systems       no fevers or chills, productive cough, hemoptysis, dysphasia, odynophagia, melena, hematochezia, dysuria, hematuria, rash, seizure activity, orthopnea, PND, pedal edema, claudication. Remaining systems are negative.   Vital Signs:  Patient  profile:   70 year old female Height:      68 inches Weight:      190 pounds BMI:     28.99 Pulse rate:   60 / minute Resp:     14 per minute BP sitting:   132 / 65  (left arm)  Vitals Entered By: Burnett Kanaris (October 27, 2009 10:01 AM)  Physical Exam  General:  Well-developed well-nourished in no acute distress.  Skin is warm and dry.  HEENT is normal.  Neck is supple. No thyromegaly.  Chest is clear to auscultation with normal expansion.  Cardiovascular exam is regular rate and rhythm. 2/6 systolic murmur left sternal border. Abdominal exam nontender or distended. No masses palpated. Extremities show no edema. neuro grossly intact    Impression & Recommendations:  Problem # 1:  MITRAL VALVE REPLACEMENT, HX OF (ICD-V15.1) Status post mitral valve repair. Continue SBE prophylaxis.  Problem # 2:  ESSENTIAL HYPERTENSION, BENIGN (ICD-401.1)  Blood pressure controlled on present medications. Will continue. Her updated medication list for this problem includes:    Aspirin 81 Mg Tbec (Aspirin) ..... Once daily    Furosemide 20 Mg Tabs (Furosemide) .Marland Kitchen... Take one tablet once daily    Micardis 20 Mg Tabs (Telmisartan) .Marland Kitchen... 1/2 tab by mouth once daily    Metoprolol Succinate 100 Mg Xr24h-tab (Metoprolol succinate) .Marland Kitchen... Take 1 tablet by mouth daily    Metoprolol Succinate 50 Mg Xr24h-tab (Metoprolol succinate) .Marland Kitchen... Take one half tablet by mouth daily  Her updated medication list for this problem includes:    Aspirin 81 Mg Tbec (Aspirin) ..... Once daily    Furosemide 20 Mg Tabs (Furosemide) .Marland Kitchen... Take one tablet once daily    Micardis 20 Mg Tabs (Telmisartan) .Marland Kitchen... 1/2 tab by mouth once daily    Metoprolol Succinate 100 Mg Xr24h-tab (Metoprolol succinate) .Marland Kitchen... Take 1 tablet by mouth daily    Metoprolol Succinate 50 Mg Xr24h-tab (Metoprolol succinate) .Marland Kitchen... Take one half tablet by mouth daily  Problem # 3:  AORTIC VALVE REPLACEMENT, HX OF (ICD-V43.3) Continue SBE  prophylaxis.  Problem # 4:  COUMADIN THERAPY (ICD-V58.61) Goal INR 2-3. Consider dabigitran in the future.  Problem # 5:  HYPERLIPIDEMIA (B2193296.4) Magement per primary care.  Problem # 6:  DM (ICD-250.00)  Her updated medication list for this problem includes:    Actos 45 Mg Tabs (Pioglitazone hcl) ..... Once daily    Aspirin 81 Mg Tbec (Aspirin) ..... Once daily    Metformin Hcl 500 Mg Xr24h-tab (Metformin hcl) .Marland KitchenMarland KitchenMarland KitchenMarland Kitchen 3 tablets at bedtime, 1 tablet in morning    Micardis 20 Mg Tabs (Telmisartan) .Marland Kitchen... 1/2 tab by mouth once daily  Problem # 7:  ATRIAL FIBRILLATION (ICD-427.31) Patient remains in sinus rhythm on flecainide. Will continue at this dose. She is complaining of fatigue. Decrease Toprol to 125 mg p.o. daily. Continue Coumadin. If atrial fibrillation recurs in the future will consider increasing flecainide to 100 mg p.o. b.i.d. versus a different antiarrhythmic. Her updated medication list for this problem includes:    Aspirin 81 Mg Tbec (Aspirin) ..... Once daily    Warfarin Sodium 4 Mg Tabs (Warfarin sodium) .Marland Kitchen... Take as directed    Warfarin Sodium 2  Mg Tabs (Warfarin sodium) .Marland Kitchen... Take as directed by coumadin clinic.    Metoprolol Succinate 100 Mg Xr24h-tab (Metoprolol succinate) .Marland Kitchen... Take 1 tablet by mouth daily    Flecainide Acetate 50 Mg Tabs (Flecainide acetate) .Marland Kitchen... Take 1 tablet twice a day    Metoprolol Succinate 50 Mg Xr24h-tab (Metoprolol succinate) .Marland Kitchen... Take one half tablet by mouth daily  Patient Instructions: 1)  Your physician recommends that you schedule a follow-up appointment in: 6 MONTHS 2)  Your physician has recommended you make the following change in your medication: DECREASE METOPROLOL TO 125MG  ONCE DAILY Prescriptions: METOPROLOL SUCCINATE 50 MG XR24H-TAB (METOPROLOL SUCCINATE) Take one half tablet by mouth daily  #30 x 12   Entered by:   Fredia Beets, RN   Authorized by:   Colin Mulders, MD, [facility]   Signed by:   Fredia Beets, RN on  10/27/2009   Method used:   Electronically to        [id number] (retail)       [address]       [place], [place]  [address]       Ph: [phone] or [phone]       Fax: [phone]   RxID:   [id number]

## 2010-04-09 NOTE — Progress Notes (Signed)
Summary: hospital follow up  Phone Note Call from Patient Call back at Home Phone [phone]   Caller: Patient Reason for Call: Talk to Nurse Summary of Call: pt req to speak to nurse about hospital stay, afib.... has appt 5/23, is this too long of a wait Initial call taken by: Darnell Level,  June 30, 2009 9:08 AM  Follow-up for Phone Call        spoke with pt, she had to go to the hosp in [facility] for atrial fib. she stayed overnight and eventually converted. her metoprolol at discharge was increased to 150mg  daily. she is unsure why it happened this time, she had not taken any meds. she has an appt to see dr Stanford Breed on 5/23 and wants to make sure that is soon enough. she feels fine except for being tired which she thinks maybe the metoprolol or the stay in the hosp. will foward for dr Stanford Breed review Fredia Beets, RN  June 30, 2009 12:32 PM\par  Additional Follow-up for Phone Call Additional follow up Details #1::        f/u as scheduled Colin Mulders, MD, [facility]  June 30, 2009 4:13 PM  pt aware Fredia Beets, RN  June 30, 2009 4:24 PM

## 2010-04-09 NOTE — Progress Notes (Signed)
     Diabetes Management Exam:    Eye Exam:       Eye Exam done elsewhere          Date: 03/17/2010          Results: normal          Done by: [place]

## 2010-04-09 NOTE — Letter (Signed)
Summary: Colonoscopy-Changed to Office Visit Letter  [facility]  [address] [place], [place] [address]   Phone: [phone]  Fax: [phone]      Jul 10, 2009 MRN: [id number]   [name] [address] [place], [place]  [address]   Dear Ms. Abdelaziz,   According to our records, it is time for you to schedule a Colonoscopy. However, after reviewing your medical record, I feel that an office visit would be most appropriate to more completely evaluate you and determine your need for a repeat procedure.  Please call [phone] (option #2) at your convenience to schedule an office visit. If you have any questions, concerns, or feel that this letter is in error, we would appreciate your call.   Sincerely,  Sandy Salaam. Deatra Ina, M.D.  [facility] [phone]

## 2010-04-09 NOTE — Progress Notes (Signed)
Summary: ? re recall  Phone Note Call from Patient Call back at Home Phone [phone]   Caller: Patient Call For: [name] Reason for Call: Talk to Nurse Summary of Call: Patient wants to know why does she neeed to come in and see Dr [name] for an ov before procedure, patient states that she had an ECL on Jul 07, 2007 and thought she was going to go 5 yrs until her next col. Patient  very concerned and wants to know if theres something going on with her that she's not aware of. Initial call taken by: Ronalee Red,  Jul 15, 2009 8:32 AM  Follow-up for Phone Call        Dr.Kaplan--Please see letter from 07-10-09 and advise. Follow-up by: Vivia Ewing LPN,  May 10, 624THL 579FGE AM  Additional Follow-up for Phone Call Additional follow up Details #1::        My error.  No need for f/u exam at this time Additional Follow-up by: Inda Castle MD,  Jul 15, 2009 11:14 AM    Additional Follow-up for Phone Call Additional follow up Details #2::    Above MD orders reviewed with patient. Recall is in [place] for 07/2012. Pt. instructed to call back as needed.  Follow-up by: Vivia Ewing LPN,  May 10, 624THL 624THL AM

## 2010-04-09 NOTE — Progress Notes (Signed)
Summary: calling regarding a new medication and a stress test  Phone Note Call from Patient Call back at Home Phone [phone]   Caller: Patient Summary of Call: Pt calling regarding starting a new medication and a stress test Initial call taken by: Delsa Sale,  August 18, 2009 4:40 PM  Follow-up for Phone Call        spoke with pt, she questioned when to start the flecainide. she will start flecainide 50mg  two times a day on july 2nd and then will have a GXT with the pa on july 7th at 8:30am. dr Stanford Breed aware of above.Fredia Beets, RN  August 19, 2009 2:27 PM

## 2010-04-13 ENCOUNTER — Encounter: Payer: Self-pay | Admitting: Cardiology

## 2010-04-13 ENCOUNTER — Encounter (INDEPENDENT_AMBULATORY_CARE_PROVIDER_SITE_OTHER): Payer: Medicare Other

## 2010-04-13 DIAGNOSIS — I359 Nonrheumatic aortic valve disorder, unspecified: Secondary | ICD-10-CM

## 2010-04-13 DIAGNOSIS — Z7901 Long term (current) use of anticoagulants: Secondary | ICD-10-CM

## 2010-04-16 DIAGNOSIS — I359 Nonrheumatic aortic valve disorder, unspecified: Secondary | ICD-10-CM | POA: Insufficient documentation

## 2010-04-16 DIAGNOSIS — I059 Rheumatic mitral valve disease, unspecified: Secondary | ICD-10-CM | POA: Insufficient documentation

## 2010-04-23 NOTE — Medication Information (Signed)
Summary: Coumadin Clinic  Anticoagulant Therapy  Managed by: Javier Glazier, PharmD Referring MD: [name] MD PCP: Janith Lima MD Supervising MD: Stanford Breed MD, Aaron Edelman Indication 1: Aortic Valve Disorder (ICD-424.1) Lab Used: [facility] Site: [facility] INR POC 3.1 INR RANGE 2 - 3  Dietary changes: no    Health status changes: no    Bleeding/hemorrhagic complications: no    Recent/future hospitalizations: no    Any changes in medication regimen? yes       Details: Took 5 days of Cipro 2 weeks ago.  Recent/future dental: no  Any missed doses?: no       Is patient compliant with meds? yes       Allergies: No Known Drug Allergies  Anticoagulation Management History:      The patient is taking warfarin and comes in today for a routine follow up visit.  Positive risk factors for bleeding include an age of 69 years or older and presence of serious comorbidities.  Negative risk factors for bleeding include no history of CVA/TIA.  The bleeding index is 'intermediate risk'.  Positive CHADS2 values include History of HTN and History of Diabetes.  Negative CHADS2 values include Age > 33 years old and Prior Stroke/CVA/TIA.  The start date was 07/11/2006.  Her last INR was 3.2 and today's INR is 3.1.  Anticoagulation responsible provider: Stanford Breed MD, Aaron Edelman.  INR POC: 3.1.  Cuvette Lot#: [id number].  Exp: 01/2011.    Anticoagulation Management Assessment/Plan:      The patient's current anticoagulation dose is Warfarin sodium 4 mg tabs: Take as directed, Warfarin sodium 2 mg tabs: Take as directed by coumadin clinic..  The target INR is 2.0-3.0.  The next INR is due 05/11/2010.  Anticoagulation instructions were given to patient.  Results were reviewed/authorized by Javier Glazier, PharmD.  She was notified by Javier Glazier PharmD.         Prior Anticoagulation Instructions: INR 3.2 (INR goal: 2-3)  Skip today's dose.  Take 2 mg on Mondays and Fridays and 4 mg on Sundays,  Tuesdays, Wednesdays, Thursdays, and Saturdays.  Recheck in 4 weeks.  Current Anticoagulation Instructions: INR 3.1 (goal 2-3)  Continue taking 4mg  everyday except take 2mg  on Mondays and Fridays.  Recheck in 4 weeks.

## 2010-05-11 ENCOUNTER — Encounter: Payer: Self-pay | Admitting: Cardiology

## 2010-05-11 ENCOUNTER — Encounter (INDEPENDENT_AMBULATORY_CARE_PROVIDER_SITE_OTHER): Payer: Medicare Other

## 2010-05-11 DIAGNOSIS — I359 Nonrheumatic aortic valve disorder, unspecified: Secondary | ICD-10-CM

## 2010-05-11 DIAGNOSIS — Z7901 Long term (current) use of anticoagulants: Secondary | ICD-10-CM

## 2010-05-19 NOTE — Medication Information (Signed)
Summary: rov/amr  Anticoagulant Therapy  Managed by: Tula Nakayama, RN, BSN Referring MD: [name] MD PCP: Janith Lima MD Supervising MD: Verl Blalock MD, Marcello Moores Indication 1: Aortic Valve Disorder (ICD-424.1) Lab Used: [facility] Site: [facility] INR POC 2.3 INR RANGE 2 - 3  Dietary changes: no    Health status changes: no    Bleeding/hemorrhagic complications: no    Recent/future hospitalizations: no    Any changes in medication regimen? no    Recent/future dental: no  Any missed doses?: no       Is patient compliant with meds? yes       Allergies: No Known Drug Allergies  Anticoagulation Management History:      The patient is taking warfarin and comes in today for a routine follow up visit.  Positive risk factors for bleeding include an age of 70 years or older and presence of serious comorbidities.  Negative risk factors for bleeding include no history of CVA/TIA.  The bleeding index is 'intermediate risk'.  Positive CHADS2 values include History of HTN and History of Diabetes.  Negative CHADS2 values include Age > 81 years old and Prior Stroke/CVA/TIA.  The start date was 07/11/2006.  Her last INR was 3.1.  Anticoagulation responsible provider: Verl Blalock MD, Marcello Moores.  INR POC: 2.3.  Cuvette Lot#: [id number].  Exp: 03/2011.    Anticoagulation Management Assessment/Plan:      The patient's current anticoagulation dose is Warfarin sodium 4 mg tabs: Take as directed, Warfarin sodium 2 mg tabs: Take as directed by coumadin clinic..  The target INR is 2.0-3.0.  The next INR is due 06/08/2010.  Anticoagulation instructions were given to patient.  Results were reviewed/authorized by Tula Nakayama, RN, BSN.  She was notified by Tula Nakayama, RN, BSN.         Prior Anticoagulation Instructions: INR 3.1 (goal 2-3)  Continue taking 4mg  everyday except take 2mg  on Mondays and Fridays.  Recheck in 4 weeks.  Current Anticoagulation Instructions: INR 2.3 Continue 4mg s everyday except  2mg s on Mondays and Fridays. Recheck in 4 weeks.

## 2010-05-22 LAB — CBC
HCT: 38.1 % (ref 36.0–46.0)
Hemoglobin: 12.3 g/dL (ref 12.0–15.0)
MCH: 28.8 pg (ref 26.0–34.0)
MCHC: 32.3 g/dL (ref 30.0–36.0)
MCV: 89.2 fL (ref 78.0–100.0)
Platelets: 409 10*3/uL — ABNORMAL HIGH (ref 150–400)

## 2010-05-22 LAB — DIFFERENTIAL
Basophils Absolute: 0 10*3/uL (ref 0.0–0.1)
Basophils Relative: 0 % (ref 0–1)
Monocytes Absolute: 0.4 10*3/uL (ref 0.1–1.0)
Monocytes Relative: 4 % (ref 3–12)
Neutro Abs: 9.5 10*3/uL — ABNORMAL HIGH (ref 1.7–7.7)

## 2010-05-22 LAB — GLUCOSE, CAPILLARY
Glucose-Capillary: 108 mg/dL — ABNORMAL HIGH (ref 70–99)
Glucose-Capillary: 124 mg/dL — ABNORMAL HIGH (ref 70–99)

## 2010-05-22 LAB — POCT CARDIAC MARKERS
CKMB, poc: <1 ng/mL — ABNORMAL LOW (ref 1.0–8.0)
Myoglobin, poc: 60.4 ng/mL (ref 12–200)
Troponin i, poc: <0.05 ng/mL (ref 0.00–0.09)

## 2010-05-22 LAB — POCT I-STAT, CHEM 8
BUN: 11 mg/dL (ref 6–23)
Creatinine, Ser: 0.8 mg/dL (ref 0.4–1.2)
Sodium: 138 mEq/L (ref 135–145)
TCO2: 25 mmol/L (ref 0–100)

## 2010-05-27 ENCOUNTER — Other Ambulatory Visit: Payer: Self-pay | Admitting: Cardiovascular Disease

## 2010-05-29 ENCOUNTER — Telehealth: Payer: Self-pay | Admitting: Cardiology

## 2010-05-29 DIAGNOSIS — I4891 Unspecified atrial fibrillation: Secondary | ICD-10-CM

## 2010-05-29 MED ORDER — FLECAINIDE ACETATE 100 MG PO TABS: 100.0000 mg | ORAL_TABLET | Freq: Two times a day (BID) | ORAL | Status: DC

## 2010-05-29 NOTE — Telephone Encounter (Signed)
Refill sent to [facility] pharmacy. Flecainide 100mg  3 month supply

## 2010-06-02 ENCOUNTER — Encounter: Payer: Self-pay | Admitting: Internal Medicine

## 2010-06-02 ENCOUNTER — Other Ambulatory Visit (INDEPENDENT_AMBULATORY_CARE_PROVIDER_SITE_OTHER): Payer: Medicare Other

## 2010-06-02 ENCOUNTER — Ambulatory Visit (INDEPENDENT_AMBULATORY_CARE_PROVIDER_SITE_OTHER): Payer: Medicare Other | Admitting: Internal Medicine

## 2010-06-02 DIAGNOSIS — E119 Type 2 diabetes mellitus without complications: Secondary | ICD-10-CM

## 2010-06-02 DIAGNOSIS — I4891 Unspecified atrial fibrillation: Secondary | ICD-10-CM

## 2010-06-02 DIAGNOSIS — E559 Vitamin D deficiency, unspecified: Secondary | ICD-10-CM

## 2010-06-02 DIAGNOSIS — E785 Hyperlipidemia, unspecified: Secondary | ICD-10-CM

## 2010-06-02 DIAGNOSIS — I10 Essential (primary) hypertension: Secondary | ICD-10-CM

## 2010-06-02 LAB — BASIC METABOLIC PANEL
CO2: 28 mEq/L (ref 19–32)
Calcium: 9.3 mg/dL (ref 8.4–10.5)
Creatinine, Ser: 0.8 mg/dL (ref 0.4–1.2)
Glucose, Bld: 101 mg/dL — ABNORMAL HIGH (ref 70–99)

## 2010-06-02 LAB — HEMOGLOBIN A1C: Hgb A1c MFr Bld: 5.9 % (ref 4.6–6.5)

## 2010-06-02 MED ORDER — VITAMIN D3 1.25 MG (50000 UT) PO CAPS: 1.0000 | ORAL_CAPSULE | ORAL | Status: DC

## 2010-06-02 NOTE — Assessment & Plan Note (Signed)
BP is well controlled, will check her lytes and renal function today

## 2010-06-02 NOTE — Progress Notes (Signed)
Subjective:    Patient ID: [name], female    DOB: April 13, 1940, 70 y.o.   MRN: [id number]  Diabetes She presents for her follow-up diabetic visit. She has type 2 diabetes mellitus. No MedicAlert identification noted. Her disease course has been improving. There are no hypoglycemic associated symptoms. Pertinent negatives for hypoglycemia include no confusion, dizziness, headaches, nervousness/anxiousness or pallor. Pertinent negatives for diabetes include no blurred vision, no chest pain, no fatigue, no foot paresthesias, no foot ulcerations, no polydipsia, no polyphagia, no polyuria, no visual change, no weakness and no weight loss. There are no hypoglycemic complications. Symptoms are improving. There are no diabetic complications. Current diabetic treatment includes oral agent (dual therapy). She is compliant with treatment all of the time. Her weight is stable. She is following a generally healthy diet. She has had a previous visit with a dietician. She participates in exercise every other day. Her home blood glucose trend is fluctuating minimally. Her breakfast blood glucose range is generally 110-130 mg/dl. An ACE inhibitor/angiotensin II receptor blocker is being taken.      Review of Systems  Constitutional: Negative for fever, chills, weight loss, diaphoresis, activity change, appetite change, fatigue and unexpected weight change.  Eyes: Negative for blurred vision.  Respiratory: Negative for cough, chest tightness, shortness of breath, wheezing and stridor.   Cardiovascular: Negative for chest pain, palpitations and leg swelling.  Gastrointestinal: Negative for nausea, vomiting, abdominal pain, diarrhea, blood in stool and abdominal distention.  Genitourinary: Negative for dysuria, polyuria, frequency, hematuria, flank pain and decreased urine volume.  Musculoskeletal: Negative for back pain, joint swelling, arthralgias and gait problem.  Skin: Negative for color change, pallor and  rash.  Neurological: Negative for dizziness, weakness, light-headedness, numbness and headaches.  Hematological: Negative for polydipsia and polyphagia.  Psychiatric/Behavioral: Negative for behavioral problems, confusion, dysphoric mood and agitation. The patient is not nervous/anxious.        Objective:   Physical Exam  Nursing note and vitals reviewed. Constitutional: She is oriented to person, place, and time. She appears well-developed and well-nourished. No distress.  HENT:  Head: Normocephalic and atraumatic.  Right Ear: External ear normal.  Left Ear: External ear normal.  Mouth/Throat: No oropharyngeal exudate.  Eyes: Conjunctivae and EOM are normal. Pupils are equal, round, and reactive to light. Right eye exhibits no discharge. Left eye exhibits no discharge. No scleral icterus.  Neck: Normal range of motion. Neck supple. No thyromegaly present.  Cardiovascular: Normal rate, regular rhythm and intact distal pulses.  Exam reveals no gallop and no friction rub.   Murmur heard. Pulmonary/Chest: Effort normal and breath sounds normal. No respiratory distress. She has no wheezes. She has no rales. She exhibits no tenderness.  Abdominal: Soft. Bowel sounds are normal. She exhibits no distension and no mass. There is no tenderness. There is no rebound and no guarding.  Musculoskeletal: Normal range of motion. She exhibits no edema and no tenderness.  Lymphadenopathy:    She has no cervical adenopathy.  Neurological: She is alert and oriented to person, place, and time. She has normal reflexes.  Skin: Skin is warm and dry. No rash noted. She is not diaphoretic. No erythema. No pallor.  Psychiatric: She has a normal mood and affect. Her behavior is normal. Judgment and thought content normal.       Lab Results  Component Value Date   WBC 7.1 01/26/2010   HGB TRACE-LYSED 03/27/2010   HCT 33.5* 01/26/2010   PLT 355.0 01/26/2010   CHOL 197 09/24/2009  TRIG 116.0 09/24/2009   HDL  57.70 09/24/2009   LDLDIRECT 132.3 10/17/2008   ALT 12 01/26/2010   AST 20 01/26/2010   NA 140 01/26/2010   K 4.9 01/26/2010   CL 104 01/26/2010   CREATININE 0.9 01/26/2010   BUN 21 01/26/2010   CO2 27 01/26/2010   TSH 1.24 01/26/2010   INR 3.1 04/13/2010   HGBA1C 6.1 01/26/2010   MICROALBUR 1.1 10/17/2008     Assessment & Plan:

## 2010-06-02 NOTE — Patient Instructions (Signed)
Diabetes, Type 2 Diabetes is a lasting (chronic) disease. In type 2 diabetes, the pancreas does not make enough insulin (a hormone), and the body does not respond normally to the insulin that is made. This type of diabetes was also previously called adult onset diabetes. About 90% of all those who have diabetes have type 2. It usually occurs after the age of 40 but can occur at any age. CAUSES Unlike type 1 diabetes, which happens because insulin is no longer being made, type 2 diabetes happens because the body is making less insulin and has trouble using the insulin properly. SYMPTOMS  Drinking more than usual.   Urinating more than usual.   Blurred vision.   Dry, itchy skin.   Frequent infection like yeast infections in women.   More tired than usual (fatigue).  TREATMENT  Healthy eating.   Exercise.   Medication, if needed.   Monitoring blood glucose (sugar).   Seeing your caregiver regularly.  HOME CARE INSTRUCTIONS  Check your blood glucose (sugar) at least once daily. More frequent monitoring may be necessary, depending on your medications and on how well your diabetes is controlled. Your caregiver will advise you.   Take your medicine as directed by your caregiver.   Do not smoke.   Make wise food choices. Ask your caregiver for information. Weight loss can improve your diabetes.   Learn about low blood glucose (hypoglycemia) and how to treat it.   Get your eyes checked regularly.   Have a yearly physical exam. Have your blood pressure checked. Get your blood and urine tested.   Wear a pendant or bracelet saying that you have diabetes.   Check your feet every night for sores. Let your caregiver know if you have sores that are not healing.  SEEK MEDICAL CARE IF:  You are having problems keeping your blood glucose at target range.   You feel you might be having problems with your medicines.   You have symptoms of an illness that is not improving after 24  hours.   You have a sore or wound that is not healing.   You notice a change in vision or a new problem with your vision.   You develop a fever of more than 100.5.  Document Released: 02/22/2005 Document Re-Released: 03/16/2009 Exit[employer] Patient Information 2011 [employer]. 

## 2010-06-02 NOTE — Assessment & Plan Note (Signed)
She is doing very well with no s/s, today her pulse is normal and regular, continue current meds and coumadin

## 2010-06-02 NOTE — Assessment & Plan Note (Signed)
She told me that she does not want to take a statin

## 2010-06-02 NOTE — Assessment & Plan Note (Addendum)
She is doing well with no s/s and no edema from actos, today I will check her A1c and monitor her renal function

## 2010-06-08 ENCOUNTER — Ambulatory Visit (INDEPENDENT_AMBULATORY_CARE_PROVIDER_SITE_OTHER): Payer: Medicare Other | Admitting: *Deleted

## 2010-06-08 DIAGNOSIS — I359 Nonrheumatic aortic valve disorder, unspecified: Secondary | ICD-10-CM

## 2010-06-08 DIAGNOSIS — Z7901 Long term (current) use of anticoagulants: Secondary | ICD-10-CM

## 2010-06-08 DIAGNOSIS — I059 Rheumatic mitral valve disease, unspecified: Secondary | ICD-10-CM

## 2010-06-08 NOTE — Patient Instructions (Signed)
Continue on same dosage 4mg  daily except 2mg  on Mondays and Fridays.  Recheck in 4 weeks.

## 2010-06-15 LAB — GLUCOSE, CAPILLARY
Glucose-Capillary: 111 mg/dL — ABNORMAL HIGH (ref 70–99)
Glucose-Capillary: 128 mg/dL — ABNORMAL HIGH (ref 70–99)
Glucose-Capillary: 141 mg/dL — ABNORMAL HIGH (ref 70–99)

## 2010-06-17 LAB — BASIC METABOLIC PANEL
BUN: 13 mg/dL (ref 6–23)
BUN: 16 mg/dL (ref 6–23)
BUN: 5 mg/dL — ABNORMAL LOW (ref 6–23)
BUN: 8 mg/dL (ref 6–23)
CO2: 25 mEq/L (ref 19–32)
CO2: 25 mEq/L (ref 19–32)
CO2: 30 mEq/L (ref 19–32)
Calcium: 8.1 mg/dL — ABNORMAL LOW (ref 8.4–10.5)
Calcium: 8.5 mg/dL (ref 8.4–10.5)
Calcium: 8.8 mg/dL (ref 8.4–10.5)
Calcium: 9.2 mg/dL (ref 8.4–10.5)
Chloride: 100 mEq/L (ref 96–112)
Chloride: 105 mEq/L (ref 96–112)
Chloride: 109 mEq/L (ref 96–112)
Creatinine, Ser: 0.73 mg/dL (ref 0.4–1.2)
Creatinine, Ser: 0.79 mg/dL (ref 0.4–1.2)
Creatinine, Ser: 0.8 mg/dL (ref 0.4–1.2)
Creatinine, Ser: 0.92 mg/dL (ref 0.4–1.2)
Creatinine, Ser: 0.98 mg/dL (ref 0.4–1.2)
GFR calc Af Amer: >60 mL/min (ref >60)
GFR calc Af Amer: >60 mL/min (ref >60)
GFR calc Af Amer: >60 mL/min (ref >60)
GFR calc Af Amer: >60 mL/min (ref >60)
GFR calc Af Amer: >60 mL/min (ref >60)
GFR calc Af Amer: >60 mL/min (ref >60)
GFR calc non Af Amer: 57 mL/min — ABNORMAL LOW (ref >60)
GFR calc non Af Amer: >60 mL/min (ref >60)
GFR calc non Af Amer: >60 mL/min (ref >60)
GFR calc non Af Amer: >60 mL/min (ref >60)
GFR calc non Af Amer: >60 mL/min (ref >60)
Glucose, Bld: 161 mg/dL — ABNORMAL HIGH (ref 70–99)
Potassium: 3.8 mEq/L (ref 3.5–5.1)
Potassium: 3.8 mEq/L (ref 3.5–5.1)
Potassium: 4 mEq/L (ref 3.5–5.1)
Potassium: 4.4 mEq/L (ref 3.5–5.1)
Sodium: 136 mEq/L (ref 135–145)
Sodium: 140 mEq/L (ref 135–145)
Sodium: 141 mEq/L (ref 135–145)
Sodium: 141 mEq/L (ref 135–145)
Sodium: 141 mEq/L (ref 135–145)

## 2010-06-17 LAB — CBC
HCT: 23.8 % — ABNORMAL LOW (ref 36.0–46.0)
HCT: 25.5 % — ABNORMAL LOW (ref 36.0–46.0)
HCT: 26.8 % — ABNORMAL LOW (ref 36.0–46.0)
HCT: 28.1 % — ABNORMAL LOW (ref 36.0–46.0)
HCT: 33.4 % — ABNORMAL LOW (ref 36.0–46.0)
HCT: 34.6 % — ABNORMAL LOW (ref 36.0–46.0)
Hemoglobin: 10.8 g/dL — ABNORMAL LOW (ref 12.0–15.0)
Hemoglobin: 10.8 g/dL — ABNORMAL LOW (ref 12.0–15.0)
Hemoglobin: 11.3 g/dL — ABNORMAL LOW (ref 12.0–15.0)
Hemoglobin: 11.6 g/dL — ABNORMAL LOW (ref 12.0–15.0)
Hemoglobin: 8.8 g/dL — ABNORMAL LOW (ref 12.0–15.0)
Hemoglobin: 9.4 g/dL — ABNORMAL LOW (ref 12.0–15.0)
Hemoglobin: 9.5 g/dL — ABNORMAL LOW (ref 12.0–15.0)
Hemoglobin: 9.9 g/dL — ABNORMAL LOW (ref 12.0–15.0)
MCHC: 34 g/dL (ref 30.0–36.0)
MCHC: 34.6 g/dL (ref 30.0–36.0)
MCHC: 34.6 g/dL (ref 30.0–36.0)
MCHC: 34.8 g/dL (ref 30.0–36.0)
MCHC: 35.2 g/dL (ref 30.0–36.0)
MCV: 91.3 fL (ref 78.0–100.0)
MCV: 91.3 fL (ref 78.0–100.0)
MCV: 91.5 fL (ref 78.0–100.0)
MCV: 92.3 fL (ref 78.0–100.0)
MCV: 93.6 fL (ref 78.0–100.0)
Platelets: 173 10*3/uL (ref 150–400)
Platelets: 181 10*3/uL (ref 150–400)
Platelets: 205 10*3/uL (ref 150–400)
Platelets: 288 10*3/uL (ref 150–400)
Platelets: 288 10*3/uL (ref 150–400)
Platelets: 342 10*3/uL (ref 150–400)
RBC: 2.61 MIL/uL — ABNORMAL LOW (ref 3.87–5.11)
RBC: 2.88 MIL/uL — ABNORMAL LOW (ref 3.87–5.11)
RBC: 2.95 MIL/uL — ABNORMAL LOW (ref 3.87–5.11)
RBC: 3.01 MIL/uL — ABNORMAL LOW (ref 3.87–5.11)
RBC: 3.19 MIL/uL — ABNORMAL LOW (ref 3.87–5.11)
RBC: 3.39 MIL/uL — ABNORMAL LOW (ref 3.87–5.11)
RBC: 3.47 MIL/uL — ABNORMAL LOW (ref 3.87–5.11)
RBC: 3.55 MIL/uL — ABNORMAL LOW (ref 3.87–5.11)
RBC: 3.64 MIL/uL — ABNORMAL LOW (ref 3.87–5.11)
RDW: 13.3 % (ref 11.5–15.5)
WBC: 10 10*3/uL (ref 4.0–10.5)
WBC: 11.4 10*3/uL — ABNORMAL HIGH (ref 4.0–10.5)
WBC: 13.6 10*3/uL — ABNORMAL HIGH (ref 4.0–10.5)
WBC: 13.9 10*3/uL — ABNORMAL HIGH (ref 4.0–10.5)
WBC: 17 10*3/uL — ABNORMAL HIGH (ref 4.0–10.5)
WBC: 7.4 10*3/uL (ref 4.0–10.5)
WBC: 8.4 10*3/uL (ref 4.0–10.5)
WBC: 8.9 10*3/uL (ref 4.0–10.5)
WBC: 9 10*3/uL (ref 4.0–10.5)
WBC: 9 10*3/uL (ref 4.0–10.5)

## 2010-06-17 LAB — COMPREHENSIVE METABOLIC PANEL
ALT: 11 U/L (ref 0–35)
ALT: 11 U/L (ref 0–35)
AST: 40 U/L — ABNORMAL HIGH (ref 0–37)
Alkaline Phosphatase: 57 U/L (ref 39–117)
BUN: 6 mg/dL (ref 6–23)
CO2: 24 mEq/L (ref 19–32)
CO2: 25 mEq/L (ref 19–32)
Calcium: 8.6 mg/dL (ref 8.4–10.5)
Chloride: 107 mEq/L (ref 96–112)
Chloride: 110 mEq/L (ref 96–112)
Creatinine, Ser: 0.88 mg/dL (ref 0.4–1.2)
GFR calc Af Amer: >60 mL/min (ref >60)
GFR calc non Af Amer: >60 mL/min (ref >60)
GFR calc non Af Amer: >60 mL/min (ref >60)
Glucose, Bld: 123 mg/dL — ABNORMAL HIGH (ref 70–99)
Glucose, Bld: 127 mg/dL — ABNORMAL HIGH (ref 70–99)
Potassium: 3.7 mEq/L (ref 3.5–5.1)
Sodium: 143 mEq/L (ref 135–145)
Total Bilirubin: 0.9 mg/dL (ref 0.3–1.2)
Total Bilirubin: 0.9 mg/dL (ref 0.3–1.2)

## 2010-06-17 LAB — PROTIME-INR
INR: 1.3 (ref 0.00–1.49)
INR: 1.3 (ref 0.00–1.49)
INR: 1.4 (ref 0.00–1.49)
INR: 1.5 (ref 0.00–1.49)
INR: 2.2 — ABNORMAL HIGH (ref 0.00–1.49)
Prothrombin Time: 16.4 seconds — ABNORMAL HIGH (ref 11.6–15.2)
Prothrombin Time: 16.7 seconds — ABNORMAL HIGH (ref 11.6–15.2)
Prothrombin Time: 16.7 seconds — ABNORMAL HIGH (ref 11.6–15.2)
Prothrombin Time: 16.9 seconds — ABNORMAL HIGH (ref 11.6–15.2)
Prothrombin Time: 25.7 seconds — ABNORMAL HIGH (ref 11.6–15.2)
Prothrombin Time: 31.5 seconds — ABNORMAL HIGH (ref 11.6–15.2)

## 2010-06-17 LAB — LIPID PANEL
Cholesterol: 127 mg/dL (ref 0–200)
LDL Cholesterol: 73 mg/dL (ref 0–99)
Total CHOL/HDL Ratio: 3.3 RATIO

## 2010-06-17 LAB — TSH
TSH: 1.659 u[IU]/mL (ref 0.350–4.500)
TSH: 4.696 u[IU]/mL — ABNORMAL HIGH (ref 0.350–4.500)

## 2010-06-17 LAB — GLUCOSE, CAPILLARY
Glucose-Capillary: 102 mg/dL — ABNORMAL HIGH (ref 70–99)
Glucose-Capillary: 103 mg/dL — ABNORMAL HIGH (ref 70–99)
Glucose-Capillary: 108 mg/dL — ABNORMAL HIGH (ref 70–99)
Glucose-Capillary: 112 mg/dL — ABNORMAL HIGH (ref 70–99)
Glucose-Capillary: 112 mg/dL — ABNORMAL HIGH (ref 70–99)
Glucose-Capillary: 113 mg/dL — ABNORMAL HIGH (ref 70–99)
Glucose-Capillary: 115 mg/dL — ABNORMAL HIGH (ref 70–99)
Glucose-Capillary: 115 mg/dL — ABNORMAL HIGH (ref 70–99)
Glucose-Capillary: 117 mg/dL — ABNORMAL HIGH (ref 70–99)
Glucose-Capillary: 120 mg/dL — ABNORMAL HIGH (ref 70–99)
Glucose-Capillary: 124 mg/dL — ABNORMAL HIGH (ref 70–99)
Glucose-Capillary: 126 mg/dL — ABNORMAL HIGH (ref 70–99)
Glucose-Capillary: 137 mg/dL — ABNORMAL HIGH (ref 70–99)
Glucose-Capillary: 142 mg/dL — ABNORMAL HIGH (ref 70–99)
Glucose-Capillary: 147 mg/dL — ABNORMAL HIGH (ref 70–99)
Glucose-Capillary: 147 mg/dL — ABNORMAL HIGH (ref 70–99)
Glucose-Capillary: 153 mg/dL — ABNORMAL HIGH (ref 70–99)
Glucose-Capillary: 155 mg/dL — ABNORMAL HIGH (ref 70–99)
Glucose-Capillary: 160 mg/dL — ABNORMAL HIGH (ref 70–99)
Glucose-Capillary: 164 mg/dL — ABNORMAL HIGH (ref 70–99)
Glucose-Capillary: 169 mg/dL — ABNORMAL HIGH (ref 70–99)
Glucose-Capillary: 172 mg/dL — ABNORMAL HIGH (ref 70–99)
Glucose-Capillary: 179 mg/dL — ABNORMAL HIGH (ref 70–99)
Glucose-Capillary: 88 mg/dL (ref 70–99)
Glucose-Capillary: 90 mg/dL (ref 70–99)
Glucose-Capillary: 93 mg/dL (ref 70–99)
Glucose-Capillary: 95 mg/dL (ref 70–99)
Glucose-Capillary: 96 mg/dL (ref 70–99)
Glucose-Capillary: 98 mg/dL (ref 70–99)

## 2010-06-17 LAB — CROSSMATCH

## 2010-06-17 LAB — POCT I-STAT 4, (NA,K, GLUC, HGB,HCT)
Glucose, Bld: 102 mg/dL — ABNORMAL HIGH (ref 70–99)
Glucose, Bld: 138 mg/dL — ABNORMAL HIGH (ref 70–99)
HCT: 26 % — ABNORMAL LOW (ref 36.0–46.0)
HCT: 28 % — ABNORMAL LOW (ref 36.0–46.0)
HCT: 32 % — ABNORMAL LOW (ref 36.0–46.0)
Hemoglobin: 8.2 g/dL — ABNORMAL LOW (ref 12.0–15.0)
Potassium: 3.3 mEq/L — ABNORMAL LOW (ref 3.5–5.1)
Potassium: 4.2 mEq/L (ref 3.5–5.1)
Sodium: 134 mEq/L — ABNORMAL LOW (ref 135–145)
Sodium: 142 mEq/L (ref 135–145)
Sodium: 143 mEq/L (ref 135–145)

## 2010-06-17 LAB — POCT I-STAT 3, ART BLOOD GAS (G3+)
Acid-base deficit: 1 mmol/L (ref 0.0–2.0)
Acid-base deficit: 2 mmol/L (ref 0.0–2.0)
Acid-base deficit: 4 mmol/L — ABNORMAL HIGH (ref 0.0–2.0)
Bicarbonate: 21.5 mEq/L (ref 20.0–24.0)
Bicarbonate: 23.2 mEq/L (ref 20.0–24.0)
Bicarbonate: 25 mEq/L — ABNORMAL HIGH (ref 20.0–24.0)
O2 Saturation: 100 %
O2 Saturation: 90 %
O2 Saturation: 93 %
TCO2: 23 mmol/L (ref 0–100)
TCO2: 25 mmol/L (ref 0–100)
TCO2: 26 mmol/L (ref 0–100)
pCO2 arterial: 43.6 mmHg (ref 35.0–45.0)
pCO2 arterial: 46.3 mmHg — ABNORMAL HIGH (ref 35.0–45.0)
pH, Arterial: 7.326 — ABNORMAL LOW (ref 7.350–7.400)
pO2, Arterial: 409 mmHg — ABNORMAL HIGH (ref 80.0–100.0)
pO2, Arterial: 66 mmHg — ABNORMAL LOW (ref 80.0–100.0)
pO2, Arterial: 88 mmHg (ref 80.0–100.0)

## 2010-06-17 LAB — MAGNESIUM
Magnesium: 1.8 mg/dL (ref 1.5–2.5)
Magnesium: 2 mg/dL (ref 1.5–2.5)
Magnesium: 2.4 mg/dL (ref 1.5–2.5)
Magnesium: 2.7 mg/dL — ABNORMAL HIGH (ref 1.5–2.5)
Magnesium: 3 mg/dL — ABNORMAL HIGH (ref 1.5–2.5)

## 2010-06-17 LAB — POCT I-STAT, CHEM 8
BUN: 10 mg/dL (ref 6–23)
Calcium, Ion: 1.19 mmol/L (ref 1.12–1.32)
Chloride: 103 mEq/L (ref 96–112)
HCT: 27 % — ABNORMAL LOW (ref 36.0–46.0)
Hemoglobin: 9.5 g/dL — ABNORMAL LOW (ref 12.0–15.0)
Potassium: 4.2 mEq/L (ref 3.5–5.1)
Sodium: 141 mEq/L (ref 135–145)
Sodium: 142 mEq/L (ref 135–145)
TCO2: 23 mmol/L (ref 0–100)

## 2010-06-17 LAB — URINALYSIS, ROUTINE W REFLEX MICROSCOPIC
Glucose, UA: NEGATIVE mg/dL
Hgb urine dipstick: NEGATIVE
Ketones, ur: NEGATIVE mg/dL
Nitrite: NEGATIVE
Protein, ur: NEGATIVE mg/dL
Urobilinogen, UA: 1 mg/dL (ref 0.0–1.0)
pH: 5.5 (ref 5.0–8.0)

## 2010-06-17 LAB — BLOOD GAS, ARTERIAL
Bicarbonate: 23 mEq/L (ref 20.0–24.0)
FIO2: 0.21 %
Patient temperature: 98.6
TCO2: 24.2 mmol/L (ref 0–100)
pCO2 arterial: 36.6 mmHg (ref 35.0–45.0)
pH, Arterial: 7.416 — ABNORMAL HIGH (ref 7.350–7.400)

## 2010-06-17 LAB — URINE MICROSCOPIC-ADD ON

## 2010-06-17 LAB — POCT I-STAT 3, VENOUS BLOOD GAS (G3P V)
Acid-base deficit: 5 mmol/L — ABNORMAL HIGH (ref 0.0–2.0)
Bicarbonate: 21.4 mEq/L (ref 20.0–24.0)
O2 Saturation: 89 %
pO2, Ven: 62 mmHg — ABNORMAL HIGH (ref 30.0–45.0)

## 2010-06-17 LAB — HEPARIN LEVEL (UNFRACTIONATED): Heparin Unfractionated: <0.1 IU/mL — ABNORMAL LOW (ref 0.30–0.70)

## 2010-06-17 LAB — CREATININE, SERUM
Creatinine, Ser: 0.74 mg/dL (ref 0.4–1.2)
GFR calc non Af Amer: 58 mL/min — ABNORMAL LOW (ref >60)
GFR calc non Af Amer: >60 mL/min (ref >60)

## 2010-06-17 LAB — HEMOCCULT GUIAC POC 1CARD (OFFICE)
Fecal Occult Bld: NEGATIVE
Fecal Occult Bld: NEGATIVE

## 2010-06-17 LAB — PREPARE FRESH FROZEN PLASMA

## 2010-06-17 LAB — HEMOGLOBIN A1C: Mean Plasma Glucose: 126 mg/dL

## 2010-06-17 LAB — HEMOGLOBIN AND HEMATOCRIT, BLOOD: HCT: 26.3 % — ABNORMAL LOW (ref 36.0–46.0)

## 2010-06-17 LAB — APTT: aPTT: 40 seconds — ABNORMAL HIGH (ref 24–37)

## 2010-06-19 ENCOUNTER — Other Ambulatory Visit: Payer: Self-pay | Admitting: Cardiology

## 2010-06-19 ENCOUNTER — Other Ambulatory Visit: Payer: Self-pay | Admitting: Internal Medicine

## 2010-07-01 ENCOUNTER — Other Ambulatory Visit: Payer: Self-pay | Admitting: Cardiology

## 2010-07-01 ENCOUNTER — Other Ambulatory Visit: Payer: Self-pay | Admitting: *Deleted

## 2010-07-06 ENCOUNTER — Ambulatory Visit (INDEPENDENT_AMBULATORY_CARE_PROVIDER_SITE_OTHER): Payer: Medicare Other | Admitting: *Deleted

## 2010-07-06 DIAGNOSIS — I059 Rheumatic mitral valve disease, unspecified: Secondary | ICD-10-CM

## 2010-07-06 DIAGNOSIS — I359 Nonrheumatic aortic valve disorder, unspecified: Secondary | ICD-10-CM

## 2010-07-06 LAB — POCT INR: INR: 2.5

## 2010-07-20 ENCOUNTER — Encounter: Payer: Self-pay | Admitting: Cardiology

## 2010-07-20 ENCOUNTER — Ambulatory Visit (INDEPENDENT_AMBULATORY_CARE_PROVIDER_SITE_OTHER): Payer: Medicare Other | Admitting: Cardiology

## 2010-07-20 DIAGNOSIS — I10 Essential (primary) hypertension: Secondary | ICD-10-CM

## 2010-07-20 DIAGNOSIS — Z9889 Other specified postprocedural states: Secondary | ICD-10-CM

## 2010-07-20 DIAGNOSIS — I359 Nonrheumatic aortic valve disorder, unspecified: Secondary | ICD-10-CM

## 2010-07-20 DIAGNOSIS — I350 Nonrheumatic aortic (valve) stenosis: Secondary | ICD-10-CM

## 2010-07-20 DIAGNOSIS — E785 Hyperlipidemia, unspecified: Secondary | ICD-10-CM

## 2010-07-20 DIAGNOSIS — Z954 Presence of other heart-valve replacement: Secondary | ICD-10-CM

## 2010-07-20 DIAGNOSIS — I4891 Unspecified atrial fibrillation: Secondary | ICD-10-CM

## 2010-07-20 NOTE — Assessment & Plan Note (Signed)
History of mitral valve repair. Continued SBE prophylaxis. Repeat echocardiogram.

## 2010-07-20 NOTE — Patient Instructions (Addendum)
Your physician wants you to follow-up in: 6 MONTHS You will receive a reminder letter in the mail two months in advance. If you don't receive a letter, please call our office to schedule the follow-up appointment.   Your physician has requested that you have an echocardiogram. Echocardiography is a painless test that uses sound waves to create images of your heart. It provides your doctor with information about the size and shape of your heart and how well your heart's chambers and valves are working. This procedure takes approximately one hour. There are no restrictions for this procedure.   STOP ASPIRIN

## 2010-07-20 NOTE — Assessment & Plan Note (Signed)
Management per primary care. 

## 2010-07-20 NOTE — Assessment & Plan Note (Signed)
Patient remains in sinus rhythm. Continue Coumadin with goal INR 2-3. Continue Toprol and flecanide.

## 2010-07-20 NOTE — Progress Notes (Signed)
HPI: Pleasant female with history of severe AS, mitral regurgitation, aneurysm of the ascending thoracic aorta (status post aortic valve replacement, mitral valve repair, resection and grafting of the ascending  thoracic aortic aneurysm on Jul 07, 2006). History of paroxysmal atrial fibrillation (status post maze  procedure on Jul 07, 2006). Followup catheterization performed secondary to elevated troponin and recurrent atrial fibrillation  revealed an ejection fraction of 25-30% and occlusion of the reimplanted left main.  The patient subsequently underwent redo coronary artery bypass and graft with a LIMA to the LAD and a saphenous vein graft to the OM. She also had an epicardial lead placed if she required biventricular pacing in the future. A followup echocardiogram was performed in March of 2011.  Her LV function was normal. The prosthetic aortic valve was functioning appropriately with only mild aortic insufficiency. There was mild mitral stenosis  following mitral valve repair. There was mild right atrial and right ventricular enlargement. She was readmitted in August of 2011 with atrial flutter and underwent cardioversion. Her flecainide was increased to 100 mg p.o. b.i.d. A followup exercise treadmill showed no exercise induced ventricular tachycardia. She was seen by Dr. Rayann Heman. He recommended continuing management with same medications. If atrial fibrillation recurred then we could consider ablation versus tikosyn. I last saw her in Nov 2011. Since then, the patient has dyspnea with more extreme activities but not with routine activities. It is relieved with rest. It is not associated with chest pain. There is no orthopnea, PND or pedal edema. There is no syncope or palpitations. There is no exertional chest pain.    Current Outpatient Prescriptions  Medication Sig Dispense Refill  . ACTOS 45 MG tablet TAKE 1 TABLET EVERY DAY  90 tablet  4  . Calcium Carbonate (CALCIUM 600) 1500 MG TABS Take 1 tablet  by mouth daily.        . Cholecalciferol (VITAMIN D3) 50000 UNITS CAPS Take 1 tablet by mouth once a week.  12 capsule  3  . flecainide (TAMBOCOR) 100 MG tablet Take 1 tablet (100 mg total) by mouth every 12 (twelve) hours.  180 tablet  0  . furosemide (LASIX) 20 MG tablet Take 20 mg by mouth daily.        . metformin (FORTAMET) 500 MG (OSM) 24 hr tablet 1 tablet in the am and 3 tabs at bedtime      . metoprolol (TOPROL-XL) 100 MG 24 hr tablet        . omeprazole (PRILOSEC OTC) 20 MG tablet Take 20 mg by mouth daily.        . potassium chloride SA (K-DUR,KLOR-CON) 20 MEQ tablet Take 20 mEq by mouth daily.        Marland Kitchen telmisartan (MICARDIS) 20 MG tablet Take 1/2 by mouth once daily      . vitamin B-12 (CYANOCOBALAMIN) 250 MCG tablet Take 250 mcg by mouth daily.        Marland Kitchen warfarin (COUMADIN) 4 MG tablet Take 1 tablet (4 mg total) by mouth as directed.  30 tablet  3  . DISCONTD: aspirin 81 MG EC tablet Take 81 mg by mouth daily.        Marland Kitchen DISCONTD: metoprolol (TOPROL-XL) 100 MG 24 hr tablet TAKE 1 TABLET BY MOUTH EVERY DAY  90 tablet  3     Past Medical History  Diagnosis Date  . Hypertension   . Diabetes mellitus   . Hyperlipidemia   . Anemia   . Atrial fibrillation   .  Diverticulitis   . Valvular heart disease     per HPI  . CVA (cerebral vascular accident)     s/p CVA felt due to oscillating calcium on aortic valve    Past Surgical History  Procedure Date  . Foot surgery     removed neuroma  . Aortic root replacement     homograft  . Mv repair     due to severe MR  . Resection and grafting of ascending thoracic aortic   . Aneurysm and left sided maze 2008  . Coronary artery bypass graft 4/10    redone due to occluded LM at implantation site (LIMA to LAD, SVG to OM)    History   Social History  . Marital Status: Married    Spouse Name: N/A    Number of Children: N/A  . Years of Education: N/A   Occupational History  . Not on file.   Social History Main Topics  .  Smoking status: Former Smoker    Quit date: 03/09/1983  . Smokeless tobacco: Not on file  . Alcohol Use: No  . Drug Use: No  . Sexually Active:    Other Topics Concern  . Not on file   Social History Narrative   No regular exercise    ROS: no fevers or chills, productive cough, hemoptysis, dysphasia, odynophagia, melena, hematochezia, dysuria, hematuria, rash, seizure activity, orthopnea, PND, pedal edema, claudication. Remaining systems are negative.  Physical Exam: Well-developed well-nourished in no acute distress.  Skin is warm and dry.  HEENT is normal.  Neck is supple. No thyromegaly.  Chest is clear to auscultation with normal expansion.  Cardiovascular exam is regular rate and rhythm. 2/6 systolic murmur left sternal border. No diastolic murmur noted. Abdominal exam nontender or distended. No masses palpated. Extremities show no edema. neuro grossly intact  ECG Sinus rhythm at a rate of 61. First degree AV block. Left ventricular hypertrophy. QRS widening and repolarization abnormality.

## 2010-07-20 NOTE — Assessment & Plan Note (Signed)
Continued SBE prophylaxis. Repeat echocardiogram.

## 2010-07-20 NOTE — Assessment & Plan Note (Signed)
Blood pressure controlled. Continue present medications. 

## 2010-07-21 NOTE — Assessment & Plan Note (Signed)
OFFICE VISIT   [name], [name]  DOB:  October 30, 1940                                        January 09, 2007  CHART #:  [id number]   SUBJECTIVE:  The patient is here for followup today.  She is status post  aortic root replacement, mitral valve repair, and resection grafting of  the ascending thoracic aortic aneurysm and a left-sided MAZE procedure  performed on 07/07/2006.  She has been doing well overall since her last  visit.  Her only difficulty has been an episode of irregular heart  rhythm which occurred the 1st week of October.  At that time, she was at  rest in bed and noted that her heart was pounding rapidly and felt  irregular.  She checked her pulse and noted that her heart rate varied  from 120 to 150 and was somewhat irregular.  She contacted the  cardiology office and they instructed her to take her full dose of  metoprolol at that time and to call back if she did not experience any  relief.  The episode lasted approximately 30 minutes and resolved.  When  she followed up in the cardiology office, they recommended a Holter  monitor and she has been wearing this for the past 2 weeks.  She has an  appointment with Dr. Stanford Breed today to review this strips; however, she  has not noted any further arrhythmias since that time.   MEDICATIONS:  The same since her previous visit with the exception of  her amiodarone which was discontinued in August.  She states that once  she stopped her amiodarone her appetite improved significantly and she  regained her stamina.  She completed the cardiac rehab program and is  overall doing well without complaints today.   PHYSICAL EXAM:  Blood pressure 134/74.  Pulse 80.  Respirations 18.  O2  sat 98% on room air.  Her sternotomy incision is well healed.  Heart:  Regular rate and rhythm at this time.  Lungs:  Clear to auscultation.  Extremities:  Without significant edema.   A chest CT showed stable right-sided  pulmonary nodules which are small  and unchanged from her previous scan.  Otherwise, her CT was within  normal limits.   ASSESSMENT AND PLAN:  The patient is doing well status post AVR aortic  root replacement, mitral valve repair, and resection grafting of  ascending thoracic aortic aneurysm with left-sided MAZE procedure.  She  was seen and evaluated by myself and by Dr. Roxy Manns today and he has  recommended followup in 6 months to monitor her heart rhythm.  She will  call in the interim if she experiences any problems or has questions.  She will also follow up with Dr. Stanford Breed as scheduled later today.   Valentina Gu. Roxy Manns, M.D.  Electronically Signed   GC/MEDQ  D:  01/09/2007  T:  01/10/2007  Job:  [id number]   cc:   Denice Bors. Stanford Breed, MD, [facility]

## 2010-07-21 NOTE — Assessment & Plan Note (Signed)
[place] OFFICE NOTE   CANTRECE, LINHARES                          MRN:          [id number]  DATE:04/13/2007                            DOB:          1941-02-14    [name] is a very pleasant female who has a history of aortic valve  replacement, aortic root replacement, mitral valve repair, and Maze  procedure for atrial fibrillation.  Her most recent echocardiogram was  performed in June of 2008.  Her LV function was mild-to-moderately  reduced with an ejection fraction of 40%-45%.  There was mild aortic  insufficiency.  There was no significant mitral stenosis or mitral  regurgitation status post repair.  She also has a history of mildly  abnormal chest CT that we are doing follow up studies.  A previous  CardioNet monitor showed that she was having episodes of paroxysmal  atrial fibrillation.  Since I last saw her she is doing well.  There can  be occasional mild dyspnea on exertion, but she is not exercising  routinely.  There is no orthopnea, PND, pedal edema, palpitations,  presyncope, syncope or chest pain.   MEDICATIONS INCLUDE:  1. Metamucil.  2. Actos 45 mg daily.  3. Micardis 20 mg daily.  4. Aspirin 81 mg daily.  5. Lasix 20 mg daily.  6. Potassium 20 mEq daily.  7. Coumadin as directed.  8. Lipitor 10 mg daily.  9. Metformin 2 grams p.o. nightly.  10.Prevacid.  11.Toprol 100 mg p.o. daily.   PHYSICAL EXAM:  VITAL SIGNS:  Today, shows a blood pressure of 107/67  and her pulse 69.  She was 192 pounds.  HEENT:  Normal.  NECK:  Supple.  CHEST:  Clear.  CARDIOVASCULAR:  Regular rate and rhythm.  There is a 2/6 systolic  murmur at the left sternal border.  ABDOMEN:  Her abdominal exam shows no tenderness.  EXTREMITIES:  Show no edema.   DIAGNOSES:  1. Mildly reduced left ventricular function.  She will continue on her      Toprol, Micardis, and diuretic.  I will check a BMET to follow her      potassium and renal function.  2. History of paroxysmal atrial fibrillation. I think that she is in      sinus rhythm on examination today and is status post Maze      procedure, and also has had a previous atrial appendage ligated.      She will continue on Toprol at 100 mg p.o. daily as well as her      Coumadin.  I will see back in 6 months and we will check a      CardioNet monitor at that time.  If there is no recurrent atrial      fibrillation then we can consider discontinuing her Coumadin.  3. 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  4. Hypertension.  Her blood pressure is adequately controlled.  5. Diabetes mellitus.  6. Hyperlipidemia.  She will  continue on statin.  We will check lipids      and liver today, and adjust as indicated.  7. History of abnormal chest CT.  She will need a follow up chest CT      in May.  8. History of fibroids being followed by OB/GYN.  9. Coumadin therapy.  This is being monitored in our Coumadin Clinic,      and I will check a CBC.     [name], MD, [facility]  Electronically Signed    BSC/MedQ  DD: 04/13/2007  DT: 04/14/2007  Job #: [id number]

## 2010-07-21 NOTE — Assessment & Plan Note (Signed)
[place] OFFICE NOTE   [name], [name]                          MRN:          [id number]  DATE:09/07/2006                            DOB:          [birth year]    [name] returns for followup today.  She is a very pleasant female who  is now status post aortic valve replacement, aortic root replacement,  and mitral valve repair, as well as Maze procedure.  Since I last saw  her, she apparently was seen with recurrent atrial fibrillation.  Her  amiodarone was initially increased to 200 mg p.o. b.i.d. and Lasix was  also added for dyspnea.  She also apparently had a pleural effusion;  however, she saw Dr. Roxy Manns back, and her amiodarone was reduced to 200 mg  p.o. daily secondary to nausea.  Her dyspnea had improved, and they have  not proceeded with thoracentesis for her pleural effusion.  Also of  note, she has remained in sinus rhythm since her previous bout of atrial  fibrillation.  She now denies any dyspnea on exertion, orthopnea, PND,  pedal edema, presyncope, syncope, or exertional chest pain.  She does  have nausea, although it is improved since the reduced dose of  amiodarone.   MEDICATIONS:  1. Metamucil.  2. Actos 45 mg p.o. daily.  3. Micardis 20 mg p.o. daily.  4. Aspirin 81 mg p.o. daily.  5. Coumadin as directed.  6. Lipitor 10 mg p.o. daily.  7. Prilosec over-the-counter.  8. Amiodarone 200 mg p.o. daily.  9. Potassium 15 cc daily.  10.Toprol 75 mg p.o. daily.  11.Glucophage 500 mg tablets 4 p.o. daily.  12.Lasix 40 mg p.o. daily.   PHYSICAL EXAMINATION:  VITAL SIGNS:  Blood pressure 93/56, pulse 82.  HEENT:  Normal.  NECK:  Supple.  CHEST:  Clear to auscultation.  CARDIAC:  Regular rate and rhythm.  ABDOMEN:  Benign.  EXTREMITIES:  No edema.   DIAGNOSES:  1. Status post aortic valve replacement/aortic root replacement/mitral      valve repair and Maze procedure:  We did perform an      electrocardiogram on August 17, 2006 as a baseline study following      her procedure.  Her LV function shows an ejection fraction of 40-      45%.  There was an aortic valve replacement with mild aortic      insufficiency.  There was mild left atrial enlargement.  She will      continue with SBE prophylaxis.  2. Paroxysmal atrial fibrillation:  She is in sinus rhythm on      examination today.  She is complaining of nausea, and I have asked      her to decrease her amiodarone to 100 mg p.o. daily.  Our plan will      be to continue this for eight additional weeks.  If she has no      further atrial fibrillation, then we will plan to discontinue this.      She will then have a CardioNet monitor, and  if we do not detect any      further atrial fibrillation, then we will discontinue her Coumadin.      Note:  She has had previous Maze procedure and left atrial      appendage ligation.  3. History of embolic cerebrovascular accident secondary to the      oscillating calcium on her aortic valve.  4. Hypertension:  Her blood pressure is mildly decreased today.  Her      dyspnea has improved.  We will discontinue her Lasix and her      potassium.  If she develops recurrent dyspnea, then we may add low-      dose Lasix back.  5. Diabetes mellitus:  Per her primary care physician.  6. Hyperlipidemia:  We will check lipids, liver, and a BMET today.      She will continue on her Lipitor.  7. She will need a follow-up chest CT in November.  8. Coumadin therapy:  She will follow up on the Coumadin clinic.   I will see her back in eight weeks.     [name], MD, [facility]  Electronically Signed    BSC/MedQ  DD: 09/07/2006  DT: 09/08/2006  Job #: [id number]

## 2010-07-21 NOTE — Assessment & Plan Note (Signed)
OFFICE VISIT   [name], [name]  DOB:  August 30, 1940                                        October 28, 2008  CHART #:  [id number]   The patient returns for followup approximately 4 months status post redo  median sternotomy for coronary artery bypass grafting x2 on June 27, 2008.  She was last seen here in the office on Jul 22, 2008.  Since  then, the patient has done remarkably well.  She states that her  physical recovery has been much quicker this time than it was after her  first surgery.  She enjoyed an accelerated return to normal activity,  and she feels much better than she had been feeling for months prior to  her surgery.  She has been seen in followup by Dr. Stanford Breed, and a  followup echocardiogram revealed remarkably fast recovery of left  ventricular function.  In fact, 2-D echocardiogram performed September 10, 2008 revealed normal left ventricular cavity size with normal left  ventricular systolic function and ejection fraction estimated 55%.  Furthermore, the patient reports that she has never had any further  episodes of tachy palpitations to suggest a recurrence of her paroxysmal  atrial fibrillation.  She feels quite well physically and is getting  along without any problems or complaints.  She has been taken off of  amiodarone by Dr. Stanford Breed.  She remains on Coumadin.  She is,  otherwise, doing remarkably well.   PHYSICAL EXAMINATION:  Vital Signs:  Notable for well-appearing female  with blood pressure 110/66, pulse 76 and regular, and oxygen saturation  97% on room air.  Chest:  Examination of the chest reveals a well-healed  median sternotomy scar.  Auscultation of the chest reveals clear breath  sounds which are symmetrical.  No wheezes or rhonchi noted.  Cardiovascular:  Regular rate and rhythm.  No murmurs, rubs, or gallops  are appreciated.  Abdomen:  Soft and nontender.  Extremities:  Warm and  well perfused.  There is no lower extremity  edema.   IMPRESSION:  Excellent recovery following redo median sternotomy for  coronary artery bypass grafting x2.  The patient has enjoyed a  remarkably fast recovery of left ventricular function following  revascularization, and recent echocardiogram revealed essentially normal  left ventricular function.  Furthermore, she is no longer having any  further episodes of tachy palpitations associated with recurrent  paroxysmal atrial fibrillation.   PLAN:  We will ask the patient to return to see [place] for followup and  rhythm check in 6 months' time.  We will leave any further discretion  regarding changes of her longterm medical therapy to Dr. Stanford Breed and  colleagues.  At this point, she has no physical restrictions with  respect to her previous surgery.   Valentina Gu. Roxy Manns, M.D.  Electronically Signed   CHO/MEDQ  D:  10/28/2008  T:  10/29/2008  Job:  [id number]   cc:   Denice Bors. Stanford Breed, MD, [facility]  Jama Flavors. Redmond Pulling, M.D.

## 2010-07-21 NOTE — H&P (Signed)
[name], SCHWEND NO.:  [id number]   MEDICAL RECORD NO.:  [id number]          PATIENT TYPE:  INP   LOCATION:  4705                         FACILITY:  [facility]   PHYSICIAN:  Carlena Bjornstad, MD, FACCDATE OF BIRTH:  1940-10-17   DATE OF ADMISSION:  06/21/2008  DATE OF DISCHARGE:                              HISTORY & PHYSICAL   PRIMARY CARDIOLOGIST:  [name].   PRIMARY CARE PHYSICIAN:  Kathryne Eriksson.   [name] is a 70 year old Caucasian female with a known history of  paroxysmal atrial fib with valvular heart disease, status post aortic  valve and aortic root replacement for bicuspid aortic valve and dilated  aorta.  The patient also had a mitral valve repair at the same time, and  a left-sided maze procedure.  These procedures were done in May 2008.  [name] was continued to have persistent paroxysmal atrial  fibrillation, and is being titrated up on her beta blocker therapy.  She  is anticoagulated, and states she had a therapeutic INR drawn Monday a  week ago.  She was up in [place], [place] with her  husband.  They have a home.  They spend some time up there.  Yesterday  evening, she had bent down to do some cleaning, stood back up, had  sudden onset of palpitations.  That started yesterday evening.  She took  an extra 25 mg of her metoprolol, without any change.  She states she  checked her pulse, and it was running in the 130s, and it stayed there  consistently.  She took a total of 125 mg extra of the metoprolol,  without change in her rhythm.  She remained asymptomatic.  She did have  a headache, and was aware of the palpitations and the fast heartbeat.  Otherwise, denied any chest pain, lightheadedness, dizziness,  presyncope, or syncope.  She called our answering service, and spoke  with Ignacia Bayley, nurse practitioner, who instructed her to go to the  emergency room and get evaluated.  The patient presented to the [facility] in [place], [place].  The initial rhythm  was documented as a narrow complex regular tachycardia at 138 beats per  minute.  The patient received adenosine which changed the rhythm to  atrial fibrillation with a rate well above 150.  Per documentation, she  was then given intravenous Cardizem.  Her rate settled down into an  atrial flutter, which was persistent, and she remained on a drip with a  heart rate in the 70s and 80s overnight.  Cardiac enzymes were also  obtained, that showed a troponin of 1.2, and second set troponin 5.9.  CK, CK-MB was also elevated.  The patient continued to be pain free.  A  12-lead EKG done at [facility] showed atrial flutter at a rate of  74 beats per minute.  The patient was evaluated by Dr. Tommi Emery, a  cardiologist at [facility], who spoke with Dr. Stanford Breed,  who agreed to accept the patient from [facility].  It was  arranged for the patient to be transported here.  The patient has  arrived, and is in 10 of 5 at this time.  Her Cardizem drip was stopped  on arrival to [facility] because of incompatibility with IV tubing, and  [name] heart rate now is 130.  She is asymptomatic.   PAST MEDICAL HISTORY:  1. Aortic valve replacement.  2. Aortic root replacement.  3. Mitral valve repair.  4. Maze procedure for atrial fibrillation.  Most recent echocardiogram      done in March 2009 showed normal LV function with mild aortic      insufficiency and mild mitral regurgitation.  5. Paroxysmal atrial fibrillation.  6. Chronic anticoagulation therapy.  7. CVA in 2008 without sequela.  8. Hypertension.  9. Diabetes.  10.Iron deficiency anemia.  11.Dyslipidemia.  12.Diverticulosis.  13.GERD.  14.Hiatal hernia.  15.Vertigo.  16.Pulmonary nodule noted by CT in 2008.   SOCIAL HISTORY:  The patient lives in [place] with her spouse.  She  is a retired Radiation protection practitioner, also shares a home in  [place] with her  husband part of the time.  She quit using tobacco in 1985.  She follows  a heart healthy ADA diet.  Denies any alcohol, drug, or herbal medicine  use.   FAMILY HISTORY:  Mother deceased at age 72, complications of a CVA.  Father deceased in his eighties, complications of COPD.   REVIEW OF SYSTEMS:  Positive for headache, palpitations.  All other  systems reviewed and negative.   ALLERGIES:  The patient has intolerance to NSAIDs.   MEDICATIONS:  1. Toprol-XL 100 in the morning, 75 in the evening  2. Iron.  3. Prilosec.  4. Actos 45.  5. Micardis 20.  6. Aspirin 81.  7. KCl 20.  8. Coumadin 4 mg daily, except 2 mg on Mondays.  9. Lasix 20 mg daily.   PHYSICAL EXAMINATION:  VITALS:  Temperature 98.1.  Heart rate 140.  Blood pressure 108/62.  Respirations 20, satting 96% on room air.  GENERALLY:  [name] is in no acute distress.  HEENT: Unremarkable.  NECK: Supple, without lymphadenopathy, bruits, or JVD.  CARDIOVASCULAR:  Exam reveals S1-S2.  Tachy, irregular rhythm.  She does  have a 2/6 systolic ejection murmur noted.  ABDOMEN:  Soft, nontender,  positive bowel sounds.  LOWER EXTREMITIES: Without clubbing, cyanosis or edema.  NEUROLOGICALLY:  Alert and oriented x3.   Chest x-ray is pending.  A 12-lead EKG is pending, and lab work is  pending.  The following from [facility] have been  reviewed.  A 12-lead EKG showing atrial fib flutter at a rate of 74.  PT/INR of 21.6 and 3.1 respectively, potassium of 4, creatinine 0.8.  Cardiac enzymes, troponin, initially 1.22, then 5.91, and third set  6.12.  Chest x-ray at [facility] showed mild cardiomegaly, mild vascular  engorgement, and distinctiveness.  No consolidation.   IMPRESSION:  Atrial flutter with rapid ventricular response, resulting  in a non-ST elevated myocardial infarction.  The patient currently has a  soft blood pressure.  She is not on Cardizem drip at this time.  We will   resume drip.  Give digoxin, and Dr. Ron Parker has been into examine and  assess the patient.  Will hold Coumadin in anticipation of cardiac  catheterization.  Will start heparin when INR is at or less than 2.      Rosanne Sack, ACNP      Carlena Bjornstad, MD, [facility]  Electronically Signed  MB/MEDQ  D:  06/21/2008  T:  06/22/2008  Job:  [id number]

## 2010-07-21 NOTE — Assessment & Plan Note (Signed)
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  The patient's postoperative recovery has been remarkably  uneventful.  Following hospital discharge, she has continued to do quite  well.  She has not yet been seen in followup by Dr. Stanford Breed, but he is  scheduled to see him next week.  Her Coumadin dosing has been monitored  and adjusted through the [facility].  The patient is doing  remarkably well.  She has very minimal amount of soreness in her chest,  much less than she did after her first operation.  She has not had any  shortness of breath.  She states that she does still get tired with  activity, but overall this is not slowing her down much since she feels  well.  Her appetite is good.  She has not had any tachypalpitations to  suggest recurrence of atrial fibrillation.  Her medication remain  unchanged from the time of hospital discharge.   PHYSICAL EXAMINATION:  GENERAL:  A well-appearing female.  VITAL SIGNS:  Blood pressure 137/79, pulse 92 and regular, and oxygen  saturation 95% on room air.  CHEST:  A median sternotomy incision that is healing quite nicely.  The  sternum is stable on palpation.  LUNGS:  Breath sounds are clear to auscultation and symmetrical  bilaterally.  No wheezes or rhonchi are noted.  CARDIOVASCULAR:  Regular rate and rhythm.  No murmurs, rubs, or gallops  are noted.  ABDOMEN:  Soft and nontender.  EXTREMITIES:  Warm and well perfused.  There is no lower extremity  edema.  The small incision from endoscopic vein harvest in the right  thigh is healing nicely.   DIAGNOSTIC TEST:  Chest x-ray performed today at the  [facility] is reviewed.  This demonstrates a very small residual left  pleural effusion with associated left lower lobe atelectasis.  This is  improved in comparison with the x-ray performed just prior to hospital  discharge.  All the sternal wires appear intact.  No other abnormalities  are noted.   IMPRESSION:  The patient is doing remarkably well approximately 1 month  following redo sternotomy for coronary artery bypass grafting.  She is  clinically doing well and she has not had any symptomatic occurrences to  suggest recurrence of atrial fibrillation.  She has no heart failure  symptoms.   PLAN:  I have encouraged the patient to continue to gradually increase  her physical activity as tolerated with her only limitation at this  point remaining that she refrain from heavy lifting or strenuous use of  her arms or shoulders for at least another 2 months.  I have encouraged  her go ahead and get started in the outpatient cardiac rehab program.  All of her questions have been addressed.  We have not made any changes  in her current medications.   Valentina Gu. Roxy Manns, M.D.  Electronically Signed   CHO/MEDQ  D:  07/22/2008  T:  07/23/2008  Job:  [id number]   cc:   Denice Bors. Stanford Breed, MD, [facility]  Bruce R. Olevia Perches, MD, [facility]  Jama Flavors. Redmond Pulling, M.D.

## 2010-07-21 NOTE — Op Note (Signed)
[name], [name]                   ACCOUNT NO.:  [id number]   MEDICAL RECORD NO.:  [id number]          PATIENT TYPE:  INP   LOCATION:  2310                         FACILITY:  [facility]   PHYSICIAN:  Valentina Gu. Roxy Manns, M.D. DATE OF BIRTH:  [birth year]   DATE OF PROCEDURE:  07/07/2006  DATE OF DISCHARGE:                               OPERATIVE REPORT   PREOPERATIVE DIAGNOSIS:  1. Severe aortic stenosis.  2. Mitral regurgitation.  3. Recurrent paroxysmal atrial fibrillation.  4. Embolic stroke.  5. Aneurysm of the ascending thoracic aorta   POSTOPERATIVE DIAGNOSIS:  1. Severe aortic stenosis.  2. Mitral regurgitation.  3. Recurrent paroxysmal atrial fibrillation.  4. Embolic stroke.  5. Aneurysm of the ascending thoracic aorta   PROCEDURE:  Median sternotomy for homograft aortic root replacement,  mitral valve repair (26 mm Edwards Physio ring annuloplasty), resection  and grafting of the ascending thoracic aortic aneurysm, left sided Maze  procedure.   SURGEON:  Valentina Gu. Roxy Manns, M.D.   ASSISTANT:  Doroteo Bradford, P.A.-C.   ANESTHESIA:  General.   BRIEF CLINICAL NOTE:  The patient is a 70 year old patient followed by  Dr. Kathryne Eriksson with a long standing history of heart murmur.  In the  past, she has been diagnosed with mild aortic stenosis, although she has  not had a follow up echocardiogram in quite some time.  The patient was  admitted to the hospital [facility] with the sudden onset of blurry  vision.  She was diagnosed with an acute stroke based upon MRI revealing  evidence of an embolic lesion in the mid brain consistent with probable  embolic event.  MRI also revealed two other small areas in the left  hemisphere consistent with previous recent embolic strokes, as well,  that were subclinical.  Carotid duplex scan was notable for the absence  of extracranial cerebrovascular disease and MR angiogram of the brain  also was notable for the absence of any hemodynamically  significant  atherosclerotic disease.  The patient was noted to have episodes of  intermittent recurrent paroxysmal atrial fibrillation as well as atrial  tachycardia.  Echocardiogram was performed demonstrating moderate aortic  stenosis as well as severe mitral regurgitation.  She subsequently  underwent left and right heart catheterization that was notable for the  absence of any significant coronary artery disease.  Transesophageal  echocardiogram was performed by Dr. [name].  This confirmed the  presence of moderate aortic stenosis with moderate to severe mitral  regurgitation.  In addition, there was an oscillating density on the  aortic valve that appeared very friable, and this was felt to be likely  to represent the source of the patient's embolic event.  There was no  clot appreciated in the atrial appendage.  Subsequent CT angiogram of  the thoracic aorta revealed aneurysmal dilatation of the ascending  thoracic aorta.   OPERATIVE CONSENT:  The patient and her husband have been counseled at  length regarding the indications, risks, and potential benefits of  surgery.  The need for aortic valve replacement with possible mitral  valve repair or  replacement has been discussed at length.  The rationale  for concomitant replacement of the ascending thoracic aorta has also  been reviewed.  The additional risks and benefits of a concomitant Maze  procedure have also been discussed.  Alternatives for valve replacement  have been reviewed, and after considerable discussion, the patient  specifically desires that some type of bioprosthetic tissue valve be  utilized for aortic valve replacement in an effort to avoid the need for  long term anticoagulation with Coumadin.  The patient understands that  this may be associated with some late risk of structural valve  deterioration and failure that potentially could require repeat surgical  intervention in the distant future depending  upon her longevity.  They  understand and accept all associated risks of surgery including but not  limited to risk of death, stroke, myocardial infarction, congestive  heart failure, respiratory failure, pneumonia, bleeding requiring blood  transfusion, arrhythmia, heart block or bradycardia requiring permanent  pacemaker, or late complications related to valve repair or replacement.  All of their questions have been addressed.   OPERATIVE FINDINGS:  1. Bicuspid native aortic valve with severe aortic stenosis.  2. Friable somewhat mobile dystrophic calcification on the aortic      valve without evidence for thrombus or vegetation.  3. Normal left ventricular systolic function.  4. Moderate left ventricular hypertrophy.  5. Aneurysmal enlargement of the ascending thoracic aorta  6. Functional mitral regurgitation related to annular dilatation      (functional class I) with mild systolic restriction of the      posterior leaflet of the mitral valve (functional class IIIB).  7. Preserved left ventricular function with no aortic insufficiency      and no residual mitral regurgitation after successful aortic root      replacement and mitral valve repair.   OPERATIVE NOTE IN DETAIL:  The patient was brought to the operating room  on the above mentioned date and central monitoring was established by  the anesthesia service under the care and direction Dr. Sherren Kerns.  Specifically, a Swan-Ganz catheter was placed through the right internal  jugular approach.  Bilateral radial arterial lines were placed.  Intravenous antibiotics were administered.  Following induction with  general endotracheal anesthesia, a Foley catheter was placed.  The  patient's chest, abdomen, both groins, and both lower extremities were  prepared and draped in a sterile manner.  Baseline transesophageal echocardiogram was performed by Dr. Tamala Julian.  This demonstrates bicuspid  aortic valve with severe aortic stenosis.   There is mild to moderate  mitral regurgitation.  Left ventricular systolic function appears  normal.  There is aneurysmal enlargement of the ascending thoracic  aorta.  No other abnormalities are noted.   A median sternotomy incision is performed.  The pericardium was opened.  The ascending aorta is aneurysmally dilated with fusiform dilatation  consistent with long standing bicuspid aortic valve disease.  The size  of the transverse aortic arch is fairly normal.  The patient is  heparinized systemically.  An aortic cannula was placed in the  transverse arch adjacent to the left carotid artery origin.  The venous  cannula is placed in the superior vena cava.  A second venous cannula is  placed low in the right atrium with its tip extending down the inferior  vena cava.  A retrograde cardioplegia catheter was placed through the  right atrium into the coronary sinus.  Cardiopulmonary bypass is begun.  A few adhesions were noted along the  epicardial surface of the heart and  these were divided sharply.  A temperature probe is placed in the left  ventricular septum.  A cardioplegic catheter is placed in the middle of  the ascending thoracic aortic aneurysm.   The patient is cooled to 28 degrees systemic temperature.  The aortic  crossclamp was applied immediately below the innominate artery and cold  blood cardioplegia is administered initially in an antegrade fashion  through the aortic root.  Iced saline slush was applied for topical  hypothermia.  Supplemental cardioplegia is administered retrograde  through the coronary sinus catheter.  The initial cardioplegic arrest  and myocardial cooling is felt to be excellent.  Repeat doses of  cardioplegia are administered intermittently throughout the crossclamp  portion of the operation through the aortic root, retrograde through the  coronary sinus catheter, and later using handheld catheters directly  into the left main coronary artery and  right coronary artery during  aortic root replacement.  Cardioplegia is administered intermittently  throughout the crossclamp portion of the operation to maintain left  ventricular septal temperature below 15 degrees centigrade.   The heart was retracted to the surgeon's side exposing the left sided  pulmonary veins.  The left sided pulmonary veins were encircled and  subsequently the Medtronic Cardioblate bipolar irrigated radiofrequency  ablation device is utilized to create an elliptical ablation lesion  around the base of the left sided pulmonary veins.  A similar elliptical  lesion is created across the base of the left atrial appendage.  Subsequently, the unipolar handheld irrigated radiofrequency ablation  pen is utilized to create a linear lesion joining the ellipse  surrounding the base of the left atrial appendage with the ellipse surrounding the left sided pulmonary veins.   The heart is replaced into the pericardial space.  A left atriotomy  incision was performed posteriorly through the intra-atrial groove and  extended across the back wall of the left atrium towards the mitral  valve a short distance.  The bipolar irrigative radiofrequency ablation  device was utilized to create an elliptical lesion around the right  sided pulmonary veins.  The atriotomy incision serves as the anterior  half of this ellipse and the posterior half is completed with one limb  of the bipolar device along the endocardial surface and the other limb  along the epicardial surface posterior to the left atrium.  A  longitudinal lesion is then created across the roof of the left atrium  from the cephalad apex of the atriotomy incision to reach the cephalad  apex of the elliptical lesion around the left sided pulmonary veins.  A  similar parallel lesion is created across the back wall of the left  atrium from the caudad apex of the atriotomy incision across the back  wall to reach the caudad apex of  the lesion surrounding the left sided  pulmonary veins.  Another linear lesion is then created from the caudad  apex of the atriotomy incision towards the mitral valve annulus and this  is continued all the way onto the posterior mitral valve annulus using  the unipolar handheld irrigated radiofrequency ablation pen along the  endocardial surface.  This completes the entire left sided lesion set of  the Cox-Maze procedure.   The mitral valve was exposed using a self retaining retractor.  Exposure  is felt to be satisfactory.  The mitral valve was examined.  The mitral  valve apparatus appears fairly normal with mild sclerosis of the  leaflets.  However, the leaflets were mobile throughout and there is no  mitral valve prolapse or other significant abnormalities appreciated.  There is notably annular dilatation and likely some functional  restriction of the posterior leaflet, as well, causing the patient's  broad central jet of mitral regurgitation noted on preoperative echo.   Ring annuloplasty is performed using interrupted 2-0 Ethibond horizontal  mattress sutures placed circumferentially around the entire mitral  annulus.  The mitral annulus was sized to a 26 mm annuloplasty ring.  This size corresponds fairly well to the surface area of the anterior  leaf of the mitral valve.  If anything, the anterior leaflet may be  slightly smaller than the 26 mm ring.  An Edwards Physio ring (model  number [id number], serial number [id number]) is secured in place uneventfully.  After completion of ring annuloplasty, the valve was tested for  competency and is notably perfectly competent with no significant  residual mitral regurgitation upon instilling iced saline into the left  ventricular chamber.  The left atrial appendage was oversewn from within the left atrium using a two layer closure of running 3-0 Prolene suture.  The left atriotomy incision is now closed posteriorly using a two layer  closure  of running 3-0 Prolene suture.  A left ventricular vent was  placed across the mitral valve into the left ventricular chamber prior  to completion of the closure of the atriotomy incision.   The ascending aortic aneurysm is transected in its mid portion and the  distal end is transected back just below the aortic crossclamp.  The  aortic valve was inspected.  The aortic valve is congenitally bicuspid,  heavily calcified, and stenotic.  The left main and right coronary  arteries are closed to 180 degrees opposed from each other.  The aortic  valve was excised sharply.  The aortic annulus was decalcified.  Of  note, along the aortic surface of the cusp of the aortic valve  immediately beneath the left main coronary artery there is an area of  calcification that was notably quite mobile.  This appears to correspond  to the echogenic density noted on preoperative transesophageal  echocardiogram and presumably this represents the source of recent  embolic event.  This area of calcification is quite friable and delicate  to the touch, and likely bits of it had broken loose causing the  patient's embolic event.  There is no thrombus appreciated, whatsoever,  and there are no vegetations.  The valve was cultured using a swab  culture.  After the valve was completely excised, the aortic annulus was  decalcified.  This was technically straightforward.  The aortic root is  now irrigated with copious iced saline solution.   The aortic annulus was sized to approximately 22 mm precise anatomical  diameter.  Due to the calcification, however, a 21 mm Edwards  pericardial tissue valve will not easily fit into this annulus, and as  such, a homograft aortic root replacement is felt likely to be the best  alternative given aneurysmal enlargement of the entire the ascending  aortic root.  A 21 mm human allograft aortic root is thawed and prepared  for implantation per the manufacturers instructions.  The  homograft root  implanted is distributed by [employer], serial number of [id number].  The left main coronary artery and the right coronary arteries are each  mobilized on buttons away from the aortic root.  The proximal suture  line for the homograft implantation is performed using interrupted 4-0  Ethibond simple sutures placed circumferentially around the aortic root  at the level of the nadir of the aortic annulus cusps.  Of note, the  valve is truly bicuspid and there is only two commissures in this  patient's native aortic root.  A portion of the patient's native  pericardium is cut into a long strip and used as a gusset to buttress  the proximal suture line.  After the proximal suture line was completed,  it is reinforced with BioGlue.  The left main coronary artery is now reimplanted along the under surface of the homograft aortic root after  trimming a circular hole in the homograft wall of the left sinus of  Valsalva.  Similarly, the right coronary artery is reimplanted in a  similar fashion, each of these were performed with running 6-0 Prolene  suture and their suture lines were reinforced with BioGlue.   Attention is now directed to the distal end of the descending thoracic  aorta and distal end of the homograft aorta.  The homograft aorta is  quite small and will not appropriately reach to the distal anastomosis.  Furthermore, there was enough size discrepancy that direct placement of  the aortic root using only the homograft is felt not to be wise.  Therefore, a 24 mm Hemashield straight graft was chosen as an  interposition graft.  This was initially constructed in an end-to-end  fashion to the homograft aortic root after the aortic root is trimmed  back to the level of the sinotubular junction.  This was performed with  running 4-0 Prolene suture.  The distal end of the Hemashield graft is  now trimmed and beveled appropriately and the distal anastomoses  constructed  to the distal aorta just below the aortic crossclamp using  running 4-0 Prolene suture.  Each of these suture lines are also  reinforced with BioGlue.   The patient is placed in Trendelenburg position.  One final dose of warm  retrograde hot shot cardioplegia is administered.  The lungs were  ventilated and the heart allowed to fill to evacuate any residual air.  The aortic crossclamp was removed after a total crossclamp time of 270  minutes.  The retrograde cardioplegic catheter was removed.  The heart  begins to beat spontaneously without need for cardioversion.  All suture  lines were carefully inspected for meticulous hemostasis.  Epicardial  pacing wires are affixed to the right ventricular free wall and to the  right atrial appendage.  The patient is rewarmed to 37 degrees  centigrade temperature.  The IVC cannula is removed and its cannulation  site oversewn with Prolene suture.  Low dose milrinone and dopamine  infusions are begun.   The patient is weaned from cardiopulmonary bypass without difficulty.  The patient's rhythm at separation from bypass is normal sinus rhythm.  Total cardiopulmonary bypass time for the operation is 306 minutes.  Follow up transesophageal echocardiogram performed by Dr. Tamala Julian after  separation from bypass demonstrates preserved left ventricular systolic  function.  The aortic valve appears to be functioning normally and there  is no aortic insufficiency.  There is a well seated mitral annuloplasty  ring with no residual mitral regurgitation.  There is no sign of any  residual air.   The venous and arterial cannulae are removed uneventfully.  Protamine is  administered to reverse the anticoagulation.  The mediastinum was  irrigated with saline solution containing vancomycin.  Meticulous surgical hemostasis is ascertained.  The mediastinum and both pleural  spaces  are drained using four chest tubes exited through separate stab  incisions inferiorly.   The soft tissues and pericardium anterior to the  aortic graft are reapproximated loosely.  The sternum was closed with  double strength sternal wire.  The soft tissues anterior to the sternum  are closed in multiple layers and the skin is closed with a running  subcuticular skin closure.   The patient tolerated the procedure well and was transported to the  surgical intensive care unit in stable condition.  There are no  intraoperative complications.  All sponge, instrument and needle counts  were verified correct at the completion of the operation.  The patient  was transfused 2 units packed red blood cells during cardiopulmonary  bypass due to anemia which was present preoperatively and exacerbated by  hemodilution and surgery.  The patient was transfused 2 units fresh  frozen plasma after reversal of heparin with protamine.      Valentina Gu. Roxy Manns, M.D.  Electronically Signed     CHO/MEDQ  D:  07/07/2006  T:  07/07/2006  Job:  [id number]   cc:   Denice Bors. Stanford Breed, MD, [facility]  Jama Flavors. Redmond Pulling, M.D.  Pramod P. Leonie Man, MD

## 2010-07-21 NOTE — Consult Note (Signed)
NAMESHERIDEN, BLAKEMORE NO.:  [id number]   MEDICAL RECORD NO.:  [id number]          PATIENT TYPE:  INP   LOCATION:  4705                         FACILITY:  [facility]   PHYSICIAN:  Gilford Raid, M.D.     DATE OF BIRTH:  06-08-1940   DATE OF CONSULTATION:  06/25/2008  DATE OF DISCHARGE:                                 CONSULTATION   REFERRING PHYSICIANS:  1. Bruce R. Olevia Perches, MD, [facility]  2. [name], MD, [place]:  Left main coronary artery occlusion, status  post aortic valve and ascending aortic replacement with a homograft in  2008.   CLINICAL HISTORY:  Assessed by Dr. Olevia Perches to evaluate [name] for  consideration of coronary artery bypass graft surgery.  She is a 70-year-  old woman with history of paroxysmal atrial fibrillation and valvular  heart disease who underwent replacement of her aortic valve and  ascending aortic aneurysm using a homograft aortic root and a composite  Dacron tube graft replacing from her aortic valve up to the takeoff of  the innominate artery.  She had reimplantation of the coronary arteries  into the homograft.  She also had mitral valve repair with a ring and a  left-sided maze procedure performed at that time.  She has a history of  bicuspid aortic valve disease.  These procedures were done in May 2008.  The workup of all of this was prompted by symptoms of a possible stroke,  which was eventually felt to be due to some notable thrombus within the  ascending aorta.  Since her surgery, she is continued to have persistent  paroxysmal atrial fibrillation.  She said that over the past few months,  she has had an increase in the recurrent atrial fibrillation with the  episodes typically occurring in the evening when she lays down.  She  said she has not been very active because she has been trying to avoid  anything that would stimulate an episode of atrial fibrillation.  She  was in [place], [place] recently and developed an episode  while standing up from a bent down position.  Her heart rate persisted  in the 130s.  She presented to a local hospital in [facility] and  was noted to be a narrow complex tachycardia at 138 beats per minute.  She was given an adenosine, which changed the rhythm to atrial  fibrillation with a rate well above 150.  She was then given intravenous  Cardizem and the rhythm changed to atrial flutter, which was persistent  with a rate in 70 to 80s.  Her cardiac enzymes were checked and were  elevated with a troponin of 1.2 and a second troponin of 5.9.  Her CPK  was also elevated.  She never did have any chest pain.  She was seen by  cardiologist there and transferred to [facility] for further treatment.  She underwent cardiac catheterization on June 24, 2008, which showed  that the left main coronary artery was totally occluded with a flush  total occlusion at  the aortic root.  The LAD and left circumflex filled  by collaterals from the right coronary artery, which was a large vessel  with no stenosis.  The proximal LAD and left circumflex as well as the  distal left main did not feel very well by collaterals.  Left  ventriculogram showed an ejection fraction about 25-30% with hypokinesis  of the anterolateral wall and out to the apex.  Aortic root injection  showed the homograft to have no insufficiency.   REVIEW OF SYSTEMS:  GENERAL:  She denies any fever or chills.  She has  had no recent weight changes.  She denies fatigue.  EYES:  Negative.  ENT:  Negative.  ENDOCRINE:  She has adult-onset diabetes.  She denies  hypothyroidism.  CARDIOVASCULAR:  She denies any chest pain or pressure.  She denies PND and orthopnea.  She has had some exertional dyspnea.  She  does report frequent palpitations.  She denies peripheral edema.  RESPIRATORY:  She denies cough and sputum production.  GI:  She denies  nausea or vomiting.  She denies melena and  bright red blood per rectum.  GU:  She denies dysuria and hematuria.  MUSCULOSKELETAL:  She denies  arthralgias and myalgias.  NEUROLOGIC:  She does have headaches.  She  denies any focal weakness or numbness.  She denies dizziness and  syncope.   ALLERGIES:  Shows intolerance to NONSTEROIDAL ANTI-INFLAMMATORY AGENTS.   MEDICATIONS PRIOR TO ADMISSION:  1. Toprol-XL 100 mg in the morning and 75 mg in the evening.  2. Iron.  3. Prilosec daily.  4. Actos 45 mg daily.  5. Micardis 20 mg daily.  6. Aspirin 81 mg daily.  7. Potassium chloride 20 mEq daily.  8. Coumadin 4 mg daily except 2 mg on Mondays.  9. Lasix 20 mg daily.   Her past medical history is significant for diabetes and hypertension.  She has a history of dyslipidemia.  She is status post aortic valve  replacement and ascending aortic replacement for bicuspid aortic valve  and ascending aneurysm.  She has a history of paroxysmal atrial  fibrillation, status post left-sided maze procedure.  She has a history  of stroke in 2008 without sequelae.  She has a history of iron-  deficiency anemia.  She has a history of diverticulosis.  She has a  history of gastroesophageal reflux disease.  She has a history of hiatal  hernia.  She has history of a small pulmonary nodule on CT scan in 2008.   SOCIAL HISTORY:  She lives in [place] with her husband.  She quit  smoking in 1985.  She denies alcohol use.   FAMILY HISTORY:  Mother died at age 2 with complications of a stroke.  Father died in his [id number] with complications of COPD.   PHYSICAL EXAMINATION:  VITAL SIGNS:  She is afebrile.  Blood pressure is  110/65, pulses in the 80s and regular, respiratory rate is 16 and  unlabored.  She is an elderly white female in no distress.  HEENT:  Normocephalic and atraumatic.  Pupils are equal and reactive to  light and accommodation.  Extraocular muscles are intact.  Throat is  clear.  Teeth are in good condition.  NECK:  Normal carotid  pulses bilaterally.  There are no bruits.  There  is no adenopathy or thyromegaly.  CARDIAC:  Regular rate and rhythm with normal S1 and S2.  There is no  murmur, rub, or gallop.  LUNGS:  Clear.  There  is a well-healed sternotomy incision.  ABDOMEN:  Active bowel sounds.  Her abdomen is soft, obese, and  nontender.  There are no palpable masses or organomegaly.  EXTREMITY:  No peripheral edema.  Pedal pulses are palpable bilaterally.  SKIN:  Warm and dry.  NEUROLOGIC:  Alert and oriented x3.  She has normal gait and equal motor  function bilaterally.   Laboratory examination shows normal electrolytes with BUN of 9,  creatinine of 0.73.  Coagulation profile is normal.  Hemoglobin is 10.6,  hematocrit of 30.9, white blood cell count of 8.4, platelet count  288,000.  Her fecal occult blood was negative x2.  TSH level was 1.659,  which is within normal limits.  Her BNP was 343.  Liver function profile  is within normal limits.  Albumin was 3.2.  A repeat chest CT scan was  performed today, this shows almost completely resolve right upper lobe  pneumonia with very minimal residual pneumonia or scarring.  There is a  stable right upper lobe nodule.  This was felt to be most likely be a  benign process and measures 4 mL previously and 5.5 mL now.  Is was felt  to be smaller than it was on CT scan in April 2008 compatible with the  benign process.  There was a stable small thyroid nodule.  There was a  stable small moderate-sized hiatal hernia.  There was a cholelithiasis.  There was a mildly prominent subcarinal lymph node noted.   IMPRESSION:  [name] has left main coronary occlusion with right-to-  left collaterals filling the left anterior descending and left  circumflex territories, status post aortic valve and ascending aortic  replacement with a homograft and Dacron tube graft.  Her episodes of  atrial fibrillation have been increasing over the past couple of months,  but is unclear  whether this is related to coronary ischemia.  I agreed  that her left ventricular function has decreased and she will benefit  from redo sternotomy and coronary artery bypass to the left anterior  descending and left circumflex territories.  She appeared to have some  mild narrowing of the ostium of the left anterior descending on previous  catheterization before her surgery and I think we were probably want to  bypass her left circumflex as well as left anterior descending if it  would be done.  I discussed the operative procedure of redo sternotomy  and coronary artery bypass surgery with her.  I also discussed possible  need for right axillary artery cannulation.  I discussed the benefits  and risks of surgery including, but not limited to bleeding, blood  transfusion, infection, stroke, myocardial infarction, graft failure,  and death.  She understands and agrees to proceed.  We will plan to do  this on Thursday, June 27, 2008.      Gilford Raid, M.D.  Electronically Signed     BB/MEDQ  D:  06/25/2008  T:  06/25/2008  Job:  [id number]   cc:   Denice Bors. Stanford Breed, MD, [facility]

## 2010-07-21 NOTE — Assessment & Plan Note (Signed)
[facility] OFFICE NOTE   CLOIE, KITCHEN                          MRN:          [id number]  DATE:07/26/2006                            DOB:          14-Jul-1940    [name] is an extremely pleasant 70 year old female that was recently  admitted to [facility] for an embolic CVA.  During that  admission, she was found to have paroxysmal atrial fibrillation, as well  as paroxysmal atrial tachycardia.  She also had an echocardiogram  performed that revealed moderate aortic stenosis, as well as severe  mitral regurgitation.  Her ejection fraction was 50%.  Cardiac  catheterization was also performed.  This revealed nonobstructive  coronary disease.  She ultimately underwent transesophageal  echocardiogram, and was found to have oscillating densities on her  aortic valve.  This was felt to be the source of the embolic stroke.  Note, there was no left atrial appendage thrombus.  She ultimately  underwent surgery by Dr. Roxy Manns.  She had homograph aortic root  replacement, mitral valve repair, as well as aortic valve replacement.  She also underwent a maze procedure.  The oscillating densities on the  aortic valve at the time of the surgery were found to be calcium. The  patient was placed on amiodarone preoperatively for her atrial  arrhythmias.  Since discharge, she has not had palpitations.  Note, she  did have significant palpitations with her PAT in the past.  She has  minimal dyspnea with exertion.  There is no orthopnea, PND, pedal edema,  chest pain, or syncope.   Her medications include:  1. Prevacid 30 mg p.o. b.i.d.  2. Metamucil.  3. Actos 45 mg p.o. daily.  4. Micardis 20 mg p.o. daily.  5. Amiodarone 200 mg p.o. daily.  6. Aspirin 81 mg p.o. daily.  7. Coumadin as directed.  8. Lipitor 10 mg p.o. daily.  9. Glucophage 500 mg daily.   PHYSICAL EXAMINATION:  Shows a blood pressure of 120/70.   Her pulse is  117.  She weighs 183 pounds.  HEENT:  Normal.  NECK:  Supple with no bruits.  CHEST:  Clear.  CARDIOVASCULAR EXAM:  Reveals tachycardic rate with a regular rhythm.  I  can not appreciate murmurs.  Her sternotomy is without evidence of  infection.  ABDOMINAL EXAM:  Benign.  EXTREMITIES:  Show no edema.  Her electrocardiogram shows a sinus tachycardia at a rate of 117.  There  are no significant ST changes.   DIAGNOSES:  1. Status post aortic valve replacement, aortic root replacement,      mitral valve repair, and maze procedure:  We will schedule her to      have an echocardiogram to have a baseline study following her      procedures.  2. History of paroxysmal atrial fibrillation:  She is maintaining      sinus rhythm on her present medications.  Note, she did have a maze      procedure, and her left atrial appendage was ligated.  Plan will be  to continue amiodarone for full 3 months following her procedure.      We will then discontinue this and follow her for any recurrent      atrial fibrillation.  If in 6 months there has been no recurrence,      we will schedule her for a CardioNet monitor.  If we do not detect      atrial fibrillation at that time, then we will discontinue her      Coumadin.  Note, her heart rate is elevated today, and I have asked      her to begin Toprol 12.5 mg p.o. daily, and we will advance this as      needed.  3. Recent embolic cerebral vascular accident:  This was felt to be      most likely secondary to the oscillating calcium densities on her      aortic valve.  Her left atrial appendage did not reveal evidence of      thrombus at the time of her transesophageal echocardiography      preoperatively.  4. Hypertension:  Blood pressure is well controlled on her present      medications.  5. Diabetes mellitus:  Per her primary care physician.  6. Hyperlipidemia:  We will check lipids and liver, and adjust her      Lipitor as  indicated.  This will be continued longterm, given her      history of diabetes mellitus.  I will check a BMET to follow her      potassium and renal function.  Note, given her tachycardia, we will      also check a CBC to make sure that she is not severely anemic.  She      also needs a chest x-ray for followup with Dr. Roxy Manns.  7. History of nodule on chest CT:  We will plan to repeat this in 6      months, as discussed in the discharge summary from the      hospitalization.  8. History of ovarian cyst:  She will follow up with GYN concerning      this issue.  9. Coumadin:  She is being followed in the Coumadin clinic, and a goal      INR will be 2 to 3.   I will see her back in 3 months.     [name], MD, [facility]  Electronically Signed    BSC/MedQ  DD: 07/26/2006  DT: 07/26/2006  Job #: [id number]   cc:   Valentina Gu. Roxy Manns, M.D.

## 2010-07-21 NOTE — Assessment & Plan Note (Signed)
OFFICE VISIT   KAMLESH, YSLAS  DOB:  [birth year]                                        April 28, 2009  CHART #:  [id number]   HISTORY:  The patient returns for followup status post redo coronary  artery bypass grafting x2 on June 27, 2008.  Prior to that, she had  undergone homograft aortic root replacement with mitral valve repair and  resection and grafting of ascending thoracic aortic aneurysm on Jul 07, 2006.  She developed severe left main stenosis at the site of previous  coronary artery button mobilization and surgery from her original  homograft procedure.  She did remarkably well after her second operation  and she returns to our office for late follow up today.  The patient  reports that she is doing terrific.  She states that her exercise  tolerance is dramatically better than it was prior to her surgery last  April.  She states that she only gets short of breath if she really  pushes herself physically, and with normal activity she feels fine.  She  has not had any tachy palpitations at all, and she tells me that she is  confident that she has not had any further episodes of atrial  fibrillation.  Overall, she feels well and has no complaints.  The  remainder of her review of systems is unremarkable.  The remainder of  her past medical history is unchanged.   CURRENT MEDICATIONS:  Iron, Prilosec, Actos, aspirin, Coumadin, Toprol-  XL, Micardis, Lasix, potassium, vitamin B12, vitamin D, calcium  supplement.   PHYSICAL EXAMINATION:  Notable for a well-appearing female with blood  pressure 126/69, pulse is 80 and irregular.  Two-channel telemetry  rhythm strip demonstrates what for the most part appears to be atrial  fibrillation or atrial flutter with intermittent periods of sinus  rhythm.  Oxygen saturation is 96% on room air.  Auscultation of the  chest reveals clear breath sounds which are symmetrical bilaterally.  Cardiovascular exam is  notable for irregular heart rhythm.  No murmurs,  rubs, or gallops are noted.  The abdomen is soft, nontender.  The  extremities are warm and well perfused.  There is no lower extremity  edema.   IMPRESSION:  The patient looks terrific and is feeling well.  Despite  the fact that she is not having any symptoms, two-channel telemetry  rhythm strip today confirms the fact that she probably is still having  at least intermittent episodes of atrial fibrillation which appears to  be rate controlled.  She is otherwise doing quite well.   PLAN:  I have discussed this matter with the patient and she is aware of  the fact that she will probably need to remain on Coumadin indefinitely.  She will continue to follow up with Dr. Stanford Breed for long-term attention  to her medical needs.  At some point, a followup stress test might be in  order, although clinically she is doing quite well.  In the future, she  will call and return to see [place] as needed.  All of her questions have  been addressed.   Valentina Gu. Roxy Manns, M.D.  Electronically Signed   CHO/MEDQ  D:  04/28/2009  T:  04/28/2009  Job:  [id number]   cc:   Denice Bors. Stanford Breed, MD, [facility]  Josph Macho  Rosalva Ferron, M.D.

## 2010-07-21 NOTE — Assessment & Plan Note (Signed)
OFFICE VISIT   [name], [name]  DOB:  1940-10-07                                        December 25, 2007  CHART #:  [id number]   HISTORY OF PRESENT ILLNESS:  The patient returns for followup related to  ground-glass opacity noted on previous chest CT scan.  She originally  underwent homograft aortic root replacement, mitral valve repair,  resection and grafting of ascending thoracic aortic aneurysm, and a left-  sided maze procedure on Jul 07, 2006.  She was last seen here in the  office on Jul 17, 2007.  She has undergone followup chest CT scans to  evaluate the presence of pulmonary nodules and questionable  lymphadenopathy.  Her last CT scan was notable in that all of the  previously noted lung nodules had remained stable and benign appearing,  but there was a new 1-cm ground-glass opacity appreciated in the right  upper lobe.  For this reason, she has come back with a recent chest CT  scan.  Over the last 6 months, she has continued to do well clinically.  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  This is helped and recently, she  states that she has not been having problems with feeling tired or short  of breath with activity as she had suffered from previously.  Overall,  she is getting along quite well.  She states that she does occasionally  have transient episodes of tachy palpitations.  She is being followed by  Dr. Stanford Breed and plans to have a cardiac event monitor and CardioNet  test in the near future.  She remains on Coumadin and she has not had  any known bleeding complications other than her known history of iron-  deficient anemia.  She has not had any embolic events.  The remainder of  her review of systems is unremarkable.  The remainder of her past  medical history is unchanged.   CURRENT MEDICATIONS:  Prilosec OTC, Metamucil, Actos, Micardis,  metoprolol, aspirin, Lasix,  potassium, Coumadin, Lipitor, metformin, and  Poly-Iron capsules.   PHYSICAL EXAMINATION:  GENERAL:  Notable for a well-appearing female.  VITAL SIGNS:  Blood pressure 135/79, pulse is 71 and regular.  A 2-  channel telemetry rhythm strip demonstrates what appears to be normal  sinus rhythm.  HEENT:  Unrevealing.  NECK:  Supple.  There is no lymphadenopathy.  CHEST:  Auscultation of the chest demonstrates clear breath sounds,  which are symmetrical bilaterally.  CARDIOVASCULAR:  Notable for regular rate and rhythm.  There is a grade  2/6 systolic murmur heard along the right sternal border.  No diastolic  murmurs are noted.  ABDOMEN:  Soft and nontender.  EXTREMITIES:  Warm and well perfused.  There is no lower extremity  edema.  The remainder of her physical exam is unremarkable.   DIAGNOSTIC TEST:  Chest CT scan performed on November 22, 2007, is  reviewed.  This demonstrates that the previously noted ground-glass  opacity in the right upper lobe has completely resolved.  All of the  other small pulmonary nodules remained stable and benign appearing.  There are no other significant problems identified.   IMPRESSION:  Resolution of previously noted ground-glass opacity seen on  CT scan in May 2009.  This has resolved entirely.  No further followup  is necessary.  Overall, the patient appears to be doing well.  She seems  to be maintaining sinus rhythm for the most part, although she does  still report occasional episodes of paroxysmal tachy palpitations.  She  is scheduled to undergo a Cardionet event monitor.  Depending upon  findings, she might be potentially a candidate for EP study and ablation  of recurrent atrial tachy arrhythmias now that she is more than 18  months status post left-sided maze procedure.  Of note, a right-sided  maze lesion set was not done at her original surgery.   PLAN:  We will have the patient return for further followup and rhythm  check in 6  months.  We await results from upcoming planned event monitor  as per Dr. Stanford Breed and colleagues.  All of her questions have been  addressed.   [name], M.D.  Electronically Signed   CHO/MEDQ  D:  12/25/2007  T:  12/25/2007  Job:  [id number]   cc:   Denice Bors. Stanford Breed, MD, [facility]

## 2010-07-21 NOTE — Discharge Summary (Signed)
[name], [name]                   ACCOUNT NO.:  [id number]   MEDICAL RECORD NO.:  [id number]          PATIENT TYPE:  INP   LOCATION:  2015                         FACILITY:  [facility]   PHYSICIAN:  Valentina Gu. Roxy Manns, M.D. DATE OF BIRTH:  April 11, 1940   DATE OF ADMISSION:  06/21/2008  DATE OF DISCHARGE:  07/02/2008                               DISCHARGE SUMMARY   ADMITTING DIAGNOSES:  1. History of severe AS, mitral regurgitation, aneurysm of the      ascending thoracic aorta (status post aortic root replacement,      mitral valve repair, resection and grafting of the ascending      thoracic aortic aneurysm on Jul 07, 2006).  2. History of paroxysmal atrial fibrillation (status post maze      procedure on Jul 07, 2006).  3. History of coronary artery disease.  4. History of cerebrovascular accident.  5. History of hypertension.  6. History of diabetes mellitus.  7. History of iron deficiency anemia.  8. History of dyslipidemia.  9. History of gastroesophageal reflux disease.   DISCHARGE DIAGNOSES:  1. History of severe AS, mitral regurgitation, aneurysm of the      ascending thoracic aorta (status post aortic root replacement,      mitral valve repair, resection and grafting of the ascending      thoracic aortic aneurysm on Jul 07, 2006).  2. History of paroxysmal atrial fibrillation (status post maze      procedure on Jul 07, 2006).  3. History of coronary artery disease.  4. History of cerebrovascular accident.  5. History of hypertension.  6. History of diabetes mellitus.  7. History of iron deficiency anemia.  8. History of dyslipidemia.  9. History of gastroesophageal reflux disease.  10.Postoperative atrial fibrillation with rapid ventricular response      (conversion to sinus rhythm after amiodarone).   PROCEDURES:  1. Cardiac catheterization performed by Dr. Olevia Perches on June 24, 2008.      For specifics, please see the dictated operative report.  In brief,      the patient  was found to have an EF of 25-30%, occluded left main      at the site of reimplantation as well as stenosis of the circumflex      marginal branch.  2. Redo sternotomy, coronary artery bypass grafting x2 (left internal      mammary artery to left anterior descending, saphenous vein graft to      circumflex marginal branch with endoscopic vein harvesting of the      right side by Dr. Roxy Manns on June 27, 2008.  3. Placement of left ventricular epicardial pacemaker leads x2.   HISTORY OF PRESENT ILLNESS:  This is a 70 year old Caucasian female with  past medical history of paroxysmal atrial fibrillation and valvular  heart disease.  She underwent a median sternotomy for homograft aortic  root replacement, mitral valve repair, resection graft in the ascending  thoracic aortic aneurysm, and left-sided maze procedure by Dr. Roxy Manns on  Jul 07, 2006.  The patient had originally presented with symptoms of a  possible stroke which was felt to be due to some thrombus within the  ascending aorta.  Following the aforementioned surgery, she has  continued to have paroxysmal atrial fibrillation.  She also noticed over  the past couple of months prior to this admission she had an increase in  the atrial fibrillation episodes, typically occurred when she lie down.  She limited her activity as she was trying to avoid anything that might  worsen her atrial fibrillation.  She had an episode while in  [place] and presented to the emergency room there.  She was found  to have narrow complex tachycardia.  She was given adenosine, then  changed to atrial fibrillation with increased ventricular rate above  150.  She was given IV Cardizem which then changed to atrial flutter but  with a controlled ventricular rate.  Cardiac enzymes revealed troponin  of 1.2 followed by a second troponin of 5.9.  Her CPK was also elevated.  She did not have any symptoms of chest pain, however, she was seen by a  cardiologist  and then transferred to [facility] for further evaluation  and treatment.   She underwent a cardiac catheterization with Dr. Olevia Perches on June 24, 2008, which showed the left main coronary artery was totally occluded  with a flush total occlusion of the aortic root.  The LAD and left  circumflex filled bicollaterals from the right coronary artery which was  a large vessel with no stenosis.  The proximal LAD and left circumflex  as well as a distal left main did not feel very well bicollaterals.  Ejection fraction was estimated to be 25-30% with hypokinesis of the  anterior lateral wall.  A cardiothoracic consultation was obtained.  Duplex carotid ultrasound showed no significant internal carotid artery  plaque.  ABIs were normal bilaterally.  The patient underwent the  aforementioned redo sternotomy and CABG x2 with placement of left  ventricular epicardial pacing leads by Dr. Roxy Manns on June 27, 2008.   BRIEF HOSPITAL COURSE STAY:  The patient was extubated without  difficulty early the evening of surgery on postop day #1.  The patient  remained afebrile and vital signs stable.  She was found to have acute  blood loss anemia.  She did not require any transfusions postoperatively  and her H&H was monitored closely (the patient also with a prior history  of anemia).  She was weaned off her drips as tolerated.  Swan and chest  tubes were all removed early in the postoperative course.  She was  volume overloaded and diuresed accordingly.  She then developed atrial  fibrillation with rapid ventricular rate and she was placed on an  amiodarone drip.  She had already been placed on a beta-blocker and this  was titrated accordingly.  The patient then converted to normal sinus  rhythm and was placed on amiodarone p.o.  In addition, Coumadin had been  started.  Her PT/INR were monitored daily.  She was then transferred  from the Intensive Care Unit to [facility] for further convalescence.  She  continued  to progress with cardiac rehab and remained in normal sinus  rhythm.  Her pacing wires were removed on July 01, 2008.  Currently on  postop day #5 she is afebrile, heart rate is in the 80s, BP 132/60, and  O2 sat 97% on room air.  CBGs 82, 112, and 98.  Preop weight 89 kg,  today's weight 86.4 kg.   CARDIOVASCULAR:  Regular rate and rhythm.  PULMONARY:  Slightly decreased at the bases, otherwise clear.  ABDOMEN:  Soft and nontender.  Bowel sounds present.  EXTREMITIES:  Mild lower extremity edema, right greater than left.  Wounds are clean and dry.   The patient has already been tolerating a diet and had a bowel movement.  She is going to be discharged home today.   LATEST LABORATORY STUDIES:  PT/INR from today 18.6 and 1.5 respectively.  BMET done on July 01, 2008, potassium 4.4, BUN and creatinine 16 and  0.86 respectively.  CBC also done on this date, H&H 9.5 and 28.1, white  count 9000, and platelet count 303,000.  Last chest x-ray done on July 01, 2008, showed slight improvement in aeration of the bases, slight  elevation of left hemidiaphragm with small left pleural effusion.   DISCHARGE INSTRUCTIONS:  The patient is not to drive or lift more than  10 pounds.  She is to continue with her breathing exercise daily.  She  is to walk every day and increase her frequency and duration as  tolerated.  She is to remain on a low-fat, low-salt, carbohydrate-  modified medium caloric diet.  She may shower.  She is to cleanse her  wound with mild soap and water.  She is to call the office if any wound  problems arise.   FOLLOWUP APPOINTMENTS:  1. A PT/INR is needed to be drawn on July 04, 2008, at [facility].  She has an appointment there at 3:30 p.m.  2. The patient needs to follow up with her medical doctor regarding      further diabetes management.  3. The patient is to call Dr. Jacalyn Lefevre office for followup      appointment in 2 weeks.  4. The patient has  appointment to see Dr. Roxy Manns on Jul 22, 2008, at 11      a.m.  Prior to this office appointment, a chest x-ray will be      obtained.   DISCHARGE MEDICATIONS:  1. Iron 1 tablet p.o. daily.  2. Prilosec 20 mg p.o. daily.  3. Actos 45 mg p.o. daily.  4. Enteric-coated aspirin 81 mg p.o. daily.  5. Coumadin 4 mg p.o. daily or as directed.  6. Toprol-XL 75 mg p.o. daily.  7. Micardis 10 mg p.o. daily.  8. Amiodarone 200 mg 1 tablet p.o. 2 times daily x2 weeks, then 200 mg      p.o. daily thereafter.  9. Lasix 40 mg p.o. daily.  10.Potassium 20 mEq p.o. daily, both x4 days.  11.Metformin 1000 mg p.o. daily.  The patient was instructed as      glucose continues to increase can resume preoperative dose of 2000      mg p.o. daily accordingly.  12.Ultram 50 mg 1-2 tablets every 4-6 hours as needed for pain.      Lars Pinks, PA      Blaine H. Roxy Manns, M.D.  Electronically Signed    DZ/MEDQ  D:  07/02/2008  T:  07/02/2008  Job:  [id number]   cc:   Denice Bors. Stanford Breed, MD, [facility]  Jama Flavors. Redmond Pulling, M.D.

## 2010-07-21 NOTE — Assessment & Plan Note (Signed)
[place] OFFICE NOTE   NAME:BEALAcacia, Kusch                          MRN:          [id number]  DATE:08/09/2006                            DOB:          07-Jan-1941    PRIMARY CARE PHYSICIAN:  Jama Flavors. Redmond Pulling, M.D.   CARDIOLOGIST:  Denice Bors. Stanford Breed, MD, [facility]   CLINICAL HISTORY:  Mrs. Guiffre is 70 years old and was seen today as an  add on because of increased dyspnea.  She recently was hospitalized with  a stroke which resolved and was found to have vegetation on the aortic  valve and moderate aortic stenosis and severe mitral regurgitation.  She  underwent aortic valve replacement of the tissue valve with placement of  a new aortic root graph and also had mitral valve repair and a partial  Maze procedure on the right.  She was discharged on Amiodarone.  She  started in rehab about 10 days ago and was found to be in atrial  fibrillation.  She was having some palpitation at that time.  She was  sent to the emergency room and was seen by Dr. Albertine Patricia who adjusted her  medications and increased her Amiodarone and cut back on her Coumadin.  She came in today because of persistent and increased dyspnea on  exertion.  She has had no chest pain or palpitations, but she says when  she tries to do anything mild she gets short of breath.  She also has  had a nonproductive cough.  In the emergency room, her BNP was mildly  elevated at 244, and x-ray showed bibasilar densities with pleural  fluid.  Suspect atelectasis or pneumonia without definite congestive  heart failure.   PAST MEDICAL HISTORY:  1. Nonobstructive coronary disease.  2. Hypertension.  3. Diabetes.  4. Pulmonary nodule by CT.   PHYSICAL EXAMINATION:  VITAL SIGNS:  Blood pressure 122/82, pulse 108  and regular.  NECK:  Carotid pulses were full without bruits.  The jugular venous  pulsations were visible 1 cm above the clavicle.  CHEST:  Few bibasilar  rales.  CARDIAC:  Rhythm was regular.  There was short systolic murmur at the  left sternal edge.  ABDOMEN:  Soft with normal bowel sounds.  No hepatosplenomegaly.  Peripheral pulses are full and there is trace peripheral edema.   STUDIES:  Electrocardiogram showed sinus tachycardia with a rate of 109  and poor R wave progression and nonspecific STT changes.  There was also  a left axis deviation.   IMPRESSION:  1. Dyspnea of uncertain etiology.  2. Probable mild volume overload with elevated neck veins and slight      edema and elevated DMP, possibly related to diastolic heart      failure.  3. Bilateral atelectasis and infiltrates on last x-ray, possible      postoperative findings versus possible pneumonia.  4. Status post aortic graph and aortic valve replacement and mitral      valve repair.  5. Status post recent stroke, possibly related embolization of  calcified particles from the aorta, now resolved.  6. Diabetes.  7. Hypertension.  8. Nonobstructive disease with catheterization.  9. Pulmonary nodule by CT.   RECOMMENDATIONS:  Ms. Whirley is having persistent shortness of breath, and  I am not certain regarding the etiology.  She does appear to have some  mild volume overload, and this could be contributing.  She does have  atelectasis and infiltrates on her last chest x-ray, and this could be  postoperative findings, or she could have an active infection.  We will  get a repeat chest x-ray today.  I will also increase her Toprol from 50-  75 daily and try and slow her heart rate slightly.  We will get a 2D  echo to further evaluate her LV function.  I will have her see Dr.  Stanford Breed back after that.     Bruce Alfonso Patten Olevia Perches, MD, [facility]  Electronically Signed    BRB/MedQ  DD: 08/09/2006  DT: 08/09/2006  Job #: [id number]

## 2010-07-21 NOTE — Discharge Summary (Signed)
[name], [name] NO.:  [id number]   MEDICAL RECORD NO.:  [id number]          PATIENT TYPE:  EMS   LOCATION:  [facility]                         FACILITY:  [facility]   PHYSICIAN:  Karen Lyon, MD  DATE OF BIRTH:  Apr 28, 1940   DATE OF ADMISSION:  08/04/2006  DATE OF DISCHARGE:  08/04/2006                               DISCHARGE SUMMARY   PRIMARY CARDIOLOGIST:  Karen Bors. Stanford Breed, MD, [facility].   PRIMARY CARE:  [name]. [name], M.D.   HISTORY OF PRESENT ILLNESS:  Ms. Hamilton is a very pleasant 70 year old  female status post recent hospitalization for CVA.  During that time the  patient was found to have moderate AS and severe MR.  She underwent  placement of homograft aortic root replacement with a left Maze  procedure and a mitral valve repair on Jul 07, 2006; and was discharged  home on May 7.  During that hospitalization the patient also had  surgical repair of an ascending thoracic aortic aneurysm measuring 4.2  cm.  She underwent resection and grafting.   Ms. Hamilton experienced some rhythm problems during that hospitalization  also, paroxysmal atrial fibrillation, paroxysmal atrial tachycardia.  She was started on Coumadin and amiodarone therapy.  I do not know if  she was loaded with amiodarone during that hospitalization at this time.  Ms. Hamilton states that she has been doing okay since she went home, still  weak, no residuals effects from the stroke.  She had just gone today for  her cardiac rehab orientation, where EKG/telemetry strip showed atrial  fibrillation with rapid ventricular rate.  Ms. Hamilton states that she has  been feeling bad since the weekend.  She has had a dry cough since  Sunday.  Her nose has been draining in a clear serous drainage.  She did  not really have any actual palpitations until she got to the cardiac  rehab department today; felt like her heart was thumping out of her  chest.   She apparently saw [name] on May 20 for followup  posthospitalization.  At that time according to his notes, she was  stable.  Prior to that, however, the patient apparently had had some  chest congestion, unclear of the details at this time; but apparently  was started on Lasix 40 mg for a week; and followed up with [name]  at that visit.  When she became more short of breath, this past weekend,  she apparently called the office and was told to start back on the Lasix  at 20 mg daily.  She states she does started back on it  yesterday.  She  does not complain of any weight increase over the last few days.  However, she does not have an appetite.  States that she can only lie on  her right side in bed has not really slept very well, decreased energy  and shortness of breath.  Denies any episodes chest discomfort other  than incisional discomfort.   Note the patient's last INR was 1.1 on Jul 27, 2006 the patient's  Coumadin dose was adjusted per the [facility]; and she actually had  an appointment to have it rechecked tomorrow in our office.   ALLERGIES:  No known drug allergies.   MEDICATIONS CURRENTLY INCLUDE:  1. Prilosec.  2. Actos.  3. Micardis.  4. Amiodarone 200 mg daily.  5. Toprol XL 12.5 mg daily.  6. Aspirin 81 mg.  7. Lipitor 10,  8. Glucophage.  9. Lasix 20 mg p.o.  10.Coumadin per Coumadin clinic.  11.Metamucil.  12.KCl 10 mEq daily.   PAST MEDICAL HISTORY INCLUDES:  1. Recent emboli CVA with visual disturbances no residual compromise      that occurred on June 24, 2006.  2. Moderate pulmonary hypertension.  3. Moderate AS and severe mitral regurgitation status post TEE during      recent hospitalization with placement of a homograft aortic root      repair with left-sided Maze procedure and a mitral valve repair on      Jul 07, 2006.  4. Nonobstructive coronary artery disease by cath.  5. Hypertension.  6. Diabetes.  7. Vertigo.  8. CT of the chest showed nodules with recommendation for a  34-month      follow-up CT scan.  9. GERD.  10.Diverticulosis.  11.Cholelithiasis.  12.Small hiatal hernia.  13.Uterine fibroids.  14.Remote history tobacco use.  15.Normocytic anemia.  16.Hyperlipidemia.  17.Anticoagulation therapy with Coumadin.   SOCIAL HISTORY:  The patient lives in [place] with her husband.  They  have no children.  She has a history of tobacco use, 1-1/2 packs per  day.  Exercise:  The patient just began cardiac rehab.  Denies any EtOH,  drug, or herbal medication use.  She tries to follow a heart healthy ADA  diet.   FAMILY HISTORY:  Mother deceased at age 46 secondary to stroke.  Father  deceased at 81 secondary to COPD and some type of colon disease.  No  siblings.   REVIEW OF SYSTEMS:  Positive for nasal discharge, shortness of breath,  dyspnea on exertion, palpitations dry cough, weakness, decreased  appetite.   PHYSICAL EXAM:  VITAL SIGNS:  Temperature 98.3, pulse 145, respirations  28, blood pressure 139/92.  The patient is saturating 97% on room.  Weight 82 kg.  GENERAL:  She is in no acute distress.  HEENT:  Normocephalic, atraumatic.  Pupils equal, round, and reactive to  light.  Sclerae is clear.  Mucous membranes are moist.  NECK:  Supple without lymphadenopathy, no bruits, no JVD.  CARDIOVASCULAR EXAM:  Reveals an S1-S2 irregularly irregular  tachycardic.  LUNGS:  Decreased breath sounds bilaterally with crackles in bilateral  bases.  ABDOMEN:  Soft, nontender, hypoactive bowel sounds.  EXTREMITIES:  Lower extremities without clubbing, cyanosis, or edema.  NEUROLOGICALLY:  Alert and oriented x3.   CHEST X-RAY:  Shows bibasilar densities and pleural effusions, cannot  rule out pneumonia.  No definite CHF, questionable small residual right  pneumothorax.   EKG:  At a rate of 162, atrial fibrillation with rapid ventricular  response.  BASELINE LAB WORK:  H&H 10.9 and 32.  Potassium 3.4, sodium 138, BUN and  creatinine 11 and 0.6 with  a glucose of 159.  [name] in to  examine and assess the patient.  The patient has received 150 mg  amiodarone bolus; and converted to normal sinus rhythm.  INR was  supratherapeutic at 4.7.  The patient will be supplemented for  hypokalemia; and discharged home with new prescription.  She is  to  increase her amiodarone to 200 mg p.o. b.i.d., increase her Toprol XL to  50 mg p.o. daily.   She is to hold her Coumadin for today and tomorrow, which will be  Thursday and Friday; and then on Saturday resume 2 mg, on Sunday 1 mg,  and then Monday 2 mg.  Hold Coumadin on Tuesday until she sees Alinda Deem in the [facility] Clinic at 2:00 p.m.  She has a follow up  appointment with Dr. Olevia Perches at 2:30 as [name] is not in the office  that week.  The patient will need to be reevaluated.  Patient is to  continue her Lasix at 20 mg p.o. daily until she sees Dr. Olevia Perches.      Rosanne Sack, ACNP      Karen Lyon, MD  Electronically Signed    MB/MEDQ  D:  08/04/2006  T:  08/04/2006  Job:  [id number]   cc:   [name]. [name], M.D.  Valentina Gu. Roxy Manns, M.D.

## 2010-07-21 NOTE — Assessment & Plan Note (Signed)
[place] OFFICE NOTE   JENIN, BONTON                          MRN:          [id number]  DATE:09/27/2007                            DOB:          25-Oct-1940    [name] is very pleasant female who has a history of aortic valve  replacement, aortic root replacement, mitral valve repair, and maze  procedure for atrial fibrillation.  She also has iron deficiency anemia  and is receiving iron infusions.  Her most recent echocardiogram was  performed on June 02, 2007.  At that time, her LV function was normal.  There was a tissue valve with mild aortic insufficiency.  There was mild  mitral regurgitation.  The left atrium was mildly dilated.  She states  that since I last saw her, her dyspnea has improved after her iron  infusions.  It sounds like that anemia was contributing.  She has not  had chest pain, palpitations, or syncope and there is no pedal edema.   MEDICATIONS:  Include:  1. Prilosec over the counter.  2. Actos 45 mg p.o. daily.  3. Micardis 20 mg p.o. daily.  4. Metoprolol 100 mg p.o. daily.  5. Aspirin 81 mg p.o. daily.  6. Potassium daily.  7. Lipitor 10 mg p.o. daily.  8. Metformin.  9. Coumadin.  10.Lasix 20 mg p.o. daily.   Physical exam today shows a blood pressure of 159/84 and the pulse is  84.  She weighs 149 pounds.  Her HEENT is normal.  Her neck is supple.  Her chest is clear.  Cardiovascular exam is regular rate and rhythm.  Abdominal exam shows no tenderness.  Extremities show no edema.   DIAGNOSIS:  1. Dyspnea - this has improved and was most likely secondary to her      anemia.  Note, her left ventricular function is normal.  2. History of paroxysmal atrial fibrillation - she remains in sinus      rhythm on examination today.  I will schedule her to have an atrial      fibrillation monitor in October 2009.  If it shows no atrial      fibrillation, then we will discontinue  her Coumadin.  Note, her      atrial appendage was ligated at the time of her surgery and she      also had a maze procedure.  3. History of embolic cerebrovascular accident, felt secondary to an      oscillating calcium on her aortic valve.  4. Hypertension - her blood pressure is mildly elevated today, but she      states it has been running well at home.  She will track this and      we will adjust it as indicated.  5. Diabetes mellitus.  6. Hyperlipidemia, I am continuing her on statin.  7. History of abnormal chest CT - Dr. Roxy Manns has scheduled her to have a      followup in 6 months.  8. Anemia.  9. History of fibroids.  10.Coumadin therapy.  We will see her back after atrial fibrillation monitor.     Karen Bors Stanford Breed, MD, [facility]  Electronically Signed    BSC/MedQ  DD: 09/27/2007  DT: 09/28/2007  Job #: [id number]

## 2010-07-21 NOTE — Assessment & Plan Note (Signed)
[place] OFFICE NOTE   NAME:BEALJocellyn, Zepeda                          MRN:          [id number]  DATE:11/03/2006                            DOB:          [birth year]    Mrs. Soliday is a very pleasant female who has a history of aortic valve  replacement, aortic groove replacement, mitral valve repair and MAZE  procedure for atrial fibrillation.  When I last saw her in July, she was  complaining of increased dyspnea, and we added Lasix.  Since then, her  symptoms have markedly improved.  She now denies any dyspnea on  exertion, orthopnea, PND, pedal edema, palpitations or syncope.  She has  had several episodes where she feels dizzy, but this is not associated  with syncope, palpitations, chest pain, or shortness of breath.   MEDICATIONS:  1. Metamucil.  2. Actos 45 mg p.o. q. day.  3. Micardis 20 mg p.o. q. day.  4. Aspirin 81 mg p.o. q. day.  5. Coumadin.  6. Lipitor 10 mg p.o. q. day.  7. Toprol 75 mg p.o. q. day.  8. She also takes Prevacid 30 mg p.o. q. day.  9. Amiodarone 100 mg p.o. q. day.  10.Lasix 20 mg p.o. q. day.  11.Potassium 10 mEq p.o. q. day.  12.Metformin 2000 mg p.o. q. day.   PHYSICAL EXAMINATION:  VITAL SIGNS:  Today, shows a blood pressure of  111/56 and a pulse of 72.  She weighs 175 pounds.  HEENT:  Normal.  NECK:  Supple  CHEST:  Clear.  CARDIOVASCULAR:  Regular rate and rhythm.  There is a 1/6 systolic  murmur at the left sternal border.  There is no diastolic murmur.  ABDOMEN:  Benign.  EXTREMITIES:  Show no edema.   Electrocardiogram shows a sinus rhythm at a rate of 75.  There is left  axis deviation, but no ST changes are noted.   DIAGNOSES:  1. History of mild systolic heart failure, with an ejection fraction      of 40% to 45% - She will continue on her Micardis and Toprol, as      well as  her Lasix.  Her symptoms are markedly improved.  2. Paroxysmal atrial fibrillation.   She is in sinus rhythm today.      Note she had had a previous MAZE procedure.  We will discontinue      her amiodarone.  In three months, we will plan to see her back, and      I will schedule her for an atrial fibrillation monitor at that      point.  If she has no recurrent atrial fibrillation, then we could      consider discontinuing her Coumadin at that point.  Note also that      her left atrial appendage has been ligated.  3. 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  4. Hypertension - Her blood pressure is well controlled on her present  medications.  5. Diabetes mellitus.  6. Hyperlipidemia - She will continue on her Lipitor.  7. History of abnormal chest CT - She will need a follow-up chest CT      in November.  8. Coumadin therapy.  9. History of fibroids, being followed by OB-GYN.  We will see her      back in 3 months.     [name], MD, [facility]  Electronically Signed    BSC/MedQ  DD: 11/03/2006  DT: 11/04/2006  Job #: [id number]

## 2010-07-21 NOTE — Assessment & Plan Note (Signed)
[place] OFFICE NOTE   NAME:BEALFrancie, Abellera                          MRN:          [id number]  DATE:09/27/2006                            DOB:          1940-03-23    SUBJECTIVE:  [name] returns today for followup.  She is status post  recent aortic valve replacement, aortic root replacement and mitral  valve repair, as well as a major procedure for atrial fibrillation.  When I last saw her we discontinued her Lasix and decreased her  amiodarone to 100 mg p.o. daily.  Note that she did have an  echocardiogram on August 17, 2006.  Her ejection fraction was 40%-45%.  There was a tissue aortic valve with mild aortic insufficiency.  There  was no mitral regurgitation or mitral stenosis.  Since we discontinued  her Lasix, she has gradually been developing worsening dyspnea on  exertion.  There is no orthopnea, PND, pedal edema, palpitations, pre-  syncope, syncope or chest pain.  Also note that she went to rehab  yesterday.  Her heart rate was found to be elevated.  They did fax  strips.  It is difficult to discern whether this is sinus tachycardia  with PACs, versus atrial fibrillation/flutter.  Today she continues to  have dyspnea on exertion, but is otherwise asymptomatic.   MEDICATIONS:  1. Metamucil.  2. Actos 45 mg p.o. daily.  3. Micardis 20 mg p.o. daily  4. Aspirin 81 mg p.o. daily  5. Coumadin as directed.  6. Lipitor 10 mg p.o. daily  7. Toprol 75 mg p.o. daily  8. Amiodarone 100 mg p.o. daily  9. Glucophage 500 mg tab, four p.o. q.d.   PHYSICAL EXAMINATION:  VITAL SIGNS:  Blood pressure 130/60, pulse 87.  Weight 178 pounds.  This compares to 174 pounds on September 07, 2006.  HEENT:  Normal.  NECK:  Supple.  CHEST:  Clear.  CARDIOVASCULAR:  Regular rate and rhythm.  ABDOMEN:  Benign.  EXTREMITIES:  Show no edema.   Her electrocardiogram today shows a sinus rhythm at a rate of 87.  There  are no  significant ST changes.   DIAGNOSIS:  1. Acute systolic congestive heart failure:  The patient is      complaining of increasing dyspnea on exertion and her ejection      fraction is in the 40%-45% range.  She will continue on her      Micardis and Toprol.  I will resume her Lasix at 40 mg p.o. daily      for four days and then decrease to 20 mg p.o. daily  We will also      add potassium 20 mEq p.o. daily for three days and then decrease to      10 mEq p.o. daily  I will check a BMET and a beta natriuretic      peptide in one week.  We also asked her to follow up with her      primary care physician for consideration of changing her Actos to a  different medication that does not cause fluid retention.  2. Paroxysmal atrial fibrillation:  I could not tell whether these      strips yesterday were recurrent atrial fibrillation or flutter,      versus sinus tachycardia with PACs; however, she is in sinus today      and we will continue with her amiodarone.  I will check a TSH when      she returns in one week.  Note that our plan previously has been to      continue this medication for approximately two to three months and      then discontinue if she has no recurrent atrial fibrillation.  We      then could proceed with the [place] monitor, and if there is no      atrial fibrillation in six months, then we could consider      discontinuing her Coumadin.  Note that she has had a previous [place]      procedure and her left atrial appendage has been ligated.  3. 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  4. Hypertension:  Her blood pressure is well-controlled on her present      medications.  5. Diabetes mellitus:  As per above.  6. Hyperlipidemia:  Will continue with her Lipitor.   DISPOSITION:  1. She will need a follow-up chest CT in November.  2. Coumadin therapy:  She is followed in our Coumadin Clinic with a       goal INR of 2-3.   FOLLOWUP:  I will see her back on November 03, 2006, as scheduled.     [name], MD, [facility]  Electronically Signed    BSC/MedQ  DD: 09/27/2006  DT: 09/27/2006  Job #: [id number]

## 2010-07-21 NOTE — Op Note (Signed)
[name], KEADY NO.:  [id number]   MEDICAL RECORD NO.:  [id number]          PATIENT TYPE:  INP   LOCATION:  2307                         FACILITY:  [facility]   PHYSICIAN:  Valentina Gu. Roxy Manns, M.D. DATE OF BIRTH:  10-05-1940   DATE OF PROCEDURE:  06/27/2008  DATE OF DISCHARGE:                               OPERATIVE REPORT   PREOPERATIVE DIAGNOSIS:  Left main disease, status post homograft aortic  root replacement with composite graft replacement of ascending thoracic  aortic aneurysm.   POSTOPERATIVE DIAGNOSIS:  Left main disease, status post homograft  aortic root replacement with composite graft replacement of ascending  thoracic aortic aneurysm.   PROCEDURES:  Redo median sternotomy for coronary artery bypass grafting  x2 (left internal mammary artery to distal left anterior descending  coronary artery, saphenous vein graft to circumflex marginal branch,  endoscopic saphenous vein harvest from right thigh), and placement of  left ventricular epicardial pacemaker lead x2.   SURGEON:  Valentina Gu. Roxy Manns, MD   ASSISTANT:  Gilford Raid, MD   SECOND ASSISTANT:  Faylene Million. Dominick, PA   ANESTHESIA:  General.   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  The patient underwent homograft aortic  root replacement with composite graft replacement of the ascending  thoracic aorta, mitral valve repair, and left-sided maze procedure in  May 2008.  The patient initially did quite well.  The patient has had  some recurrent paroxysmal atrial fibrillation.  In recent months, the  patient has had accelerated frequency of episodes of paroxysmal atrial  fibrillation.  She then had a sustained episode of supraventricular  tachycardia that prompted hospital admission.  At the  time of hospital  admission, cardiac enzymes were drawn and were notably elevated  suggestive of myocardial ischemia.  The patient was transferred to [facility] where she underwent cardiac catheterization.  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  The patient was  initially seen in consultation by Dr. Cyndia Bent, and plans have been made  for surgical revascularization.  The patient and her husband understand  the indications, risks, and potential benefits of surgery.  Alternative  treatment strategies have been discussed.  They understand and accept  all potential associated risks and desire to proceed with surgery as  described.   OPERATIVE NOTE IN DETAIL:  The patient was brought to the operating room  on the above-mentioned date and central monitoring was established by  the anesthesia team under the care and direction of Dr. Lorrene Reid.  Specifically, a Swan-Ganz catheter was placed through the right internal  jugular approach.  A radial arterial line was placed.  Intravenous  antibiotics were administered.  Following induction with general  endotracheal anesthesia, a Foley catheter was placed.  The patient's  chest, abdomen, both groins, and both lower extremities were prepared  and draped in sterile manner.  Baseline transesophageal echocardiogram was performed by Dr. Al Corpus.  This demonstrates mild-to-moderate left ventricular dysfunction with  severe anterior apical hypokinesis.  The aortic valve appears to be  functioning normally with only trace aortic insufficiency.  The jet of  aortic insufficiency was eccentric.  The aortic valve was tricuspid in  the leaflets, all moved well.  The patient has had previous mitral valve  repair.  There was mild trace-to-mild mitral regurgitation at most.  No  other significant abnormalities were noted.   The greater saphenous vein was removed  from the patient's right thigh  using endoscopic vein harvest technique through a small incision made  just above the patient's right knee.  The saphenous vein was good-  quality conduit.  After the saphenous vein has been removed, the small  incision in the right thigh was closed in multiple layers with running  absorbable suture.   Redo median sternotomy incision was performed.  The sternal wires were  removed and the sternum was divided with oscillating saw.  Sternal  reentry was uncomplicated.  Sharp dissection was utilized to free up the  underside of the sternum from the structures of the anterior  mediastinum.  This was technically straightforward.  The left internal  mammary artery was then harvested from the left chest wall to be  utilized for bypass grafting.  The left internal mammary artery was  somewhat frail and delicate, but otherwise good quality.  After systemic  heparinization, the left internal mammary artery was transected  distally.  It was noted to have good flow.   Dissection was now continued anteriorly and superiorly to divide the  scar tissue encasing the ascending thoracic aorta in the anterior  surface of the heart.  Inferiorly, scar tissue surrounding the left  ventricle and right ventricle was relatively mild and easily dealt with.  However, surrounding the ascending aorta, there was dense scar tissue  and adhesions.  The descending aorta has previously been replaced to the  level of the innominate artery, and no safe area for cannulation was  identified.   An oblique incision was made in the patient's left groin.  The anterior  surface of the left common femoral artery and left common femoral vein  were dissected through the small incision.  The femoral vein was  identified at the level of the greater saphenous bulb.  The intervening  side branches of the saphenous vein were all doubly ligated and divided.  The femoral artery was somewhat small caliber,  but otherwise normal in  appearance.  Full-dose heparinization was administered.  Concentric CV4  Gore-Tex pursestring sutures were placed on the anterior surface of the  left common femoral artery.  A single Gore-Tex pursestring suture was  placed around the base of the greater saphenous bulb.  The saphenous  bulb was now transected and the distal stump was doubly ligated and  divided.  The saphenous bulb was now cannulated with a femoral guidewire  which was advanced up through the femoral vein, through the inferior  vena cava, through the right atrium into the superior vena cava using  transesophageal echocardiogram for guidance.  The femoral vein was then  dilated with serial dilators and a 22-French long femoral venous cannula  was advanced over the guidewire through the inferior vena cava up  through the right atrium into the superior vena cava, again using  transesophageal echocardiogram for guidance.  The left common femoral  artery was now cannulated with a Seldinger technique through the  pursestring sutures.  The femoral artery was dilated with serial  dilators and a 16-French femoral arterial cannula was advanced into the  femoral artery uneventfully.   Cardiopulmonary bypass was begun.  Sharp dissection was continued  anteriorly to try to dissect adequate tissue surrounding the ascending  thoracic aortic graft.  Scar tissue was so dense that a plane between  the pulmonary artery and the aortic graft cannot be safely achieved.  One soft area on the anterior surface of the ascending aortic graft was  chosen for antegrade cardioplegic cannula placement and subsequent  proximal vein graft construction.  Antegrade cannula was placed in this  location on the ascending thoracic aortic graft.   The patient was allowed to cool to 30 degrees systemic temperature.  The  aortic crossclamp was applied and cold blood cardioplegia was delivered  in antegrade fashion through the aortic  root.  Iced saline slush was  applied for topical hypothermia.  The initial cardioplegic arrest and  myocardial cooling was felt to be satisfactory.   Sharp dissection was now continued to free up the entire surface of the  left ventricle from the surrounding structures in the pericardial space.  This was technically straightforward and adhesions were relatively  light.  Distal target vessels were selective for coronary artery bypass  grafting.  Of note, the patient's ventricle seems to rewarmed somewhat  prematurely.  An additional dose of cardioplegia was administered after  repositioning the aortic crossclamp.  However, it was clear that the  aortic crossclamp remains not completely occlusive.  At this point, the  crossclamp was left partially occlusive, and the heart was allowed to be  perfused antegrade throughout the duration of the procedure with empty  beating heart for construction of the bypass grafts as described.  Elastic vessel loops were used for proximal and distal vascular control  such as commonly employed for off-pump coronary artery bypass grafting,  and visualization was notably excellent.  No further attempts to arrest  the heart were employed.   The following distal coronary anastomoses were performed:  1. Circumflex marginal branch which was really high ramus intermediate      branch, was grafted with saphenous vein graft in end-to-side      fashion.  This vessel measures 2.0 mm in diameter and was a good-      quality target vessel for grafting.  2. The distal left anterior descending coronary artery was grafted      with left internal mammary artery in end-to-side fashion.  This      vessel measured 1.7 mm in diameter and was good-quality target      vessel for grafting.   The antegrade cardioplegic cannula was removed, verifying incomplete  occlusion of the aorta.  The aortic crossclamp was removed entirely.  A  4.0 mm punch was utilized to enlarge the hole  in the ascending aortic  graft, and a heart string proximal anastomosis device was utilized to  facilitate hemostasis during construction of the proximal vein graft  anastomosis, such as commonly employed for off-pump coronary artery  bypass surgery.  As such the proximal anastamosis of the vein graft to  circumflex marginal branch was then constructed directly to the  ascending aortic graft without either a crossclamp or partial occlusion  clamp in place.   Both distal and the single proximal anastomoses were all inspect for  hemostasis and appropriate graft orientation.  The patient was rewarmed  to 37 degrees centigrade temperature.  Epicardial pacing wire  was fixed  to the right ventricular free wall into the right atrial appendage.  Two  prominent screw and epicardial pacemaker leads were placed in the left  circumflex coronary territory in anticipation of the possibility of need  for biventricular pacing in the future depending upon recovery of left  ventricular performance.  Each of the Medtronic unipolar screw and  epicardial leads were placed on either side of the circumflex marginal  branch, and subsequently secured to the epicardium with silk sutures.   The patient was weaned from cardiopulmonary bypass without difficulty.  The patient's rhythm at separation from bypass was normal sinus rhythm.  The patient was weaned from bypass on dopamine at 3 mcg/kg/min and  milrinone at 0.2 mcg/kg/min.  Total crossclamp time for the operation  was 88 minutes.  Total cardiopulmonary bypass time of the operation was  128 minutes.   Followup transesophageal echocardiogram performed by Dr. Al Corpus after  separation from bypass demonstrates some improvement in left ventricular  function with improved contractility in distal anterior wall and apex.  No other abnormalities were noted.   Protamine was administered to reverse the anticoagulation.  The femoral  venous cannula was removed and the  greater saphenous bulb was ligated.  The femoral arterial cannula was removed and the pursestring sutures  were secured.  There was a palpable pulse in the distal femoral artery.   The mediastinum was irrigated with saline solution.  Meticulous surgical  hemostasis was ascertained.  The mediastinum and both left and right  pleural spaces were drained using four chest tubes exited through  separate stab incisions inferiorly.  The pericardium and soft tissues  anterior to the aorta were reapproximated loosely.   A small transverse incision was made overlying the second intercostal  space on the left anterior chest wall.  A small subcutaneous pocket was  created with electrocautery.  The two epicardial pacemaker leads were  now tunneled through the chest wall into the space.  Each lead was  individually tested for thresholds and sensitivities and subsequently  capped off and buried into the subcutaneous pocket.  The small pocket  incision was closed with interrupted Vicryl sutures to close the dermis  and subcutaneous tissues.  The skin incision was closed with a  subcuticular skin closure.   The left groin incision was inspect for hemostasis and subsequently  closed in multiple layers with interrupted Vicryl sutures followed by  subcuticular skin closure.   The sternum was closed with double-strength sternal wire.  The soft  tissues anterior to the sternum were closed in multiple layers and the  skin was closed with a running subcuticular skin closure.   The patient tolerated the procedure well and was transported to the  surgical intensive care unit in stable condition.  There were no  intraoperative complications.  All sponge, instrument, and needle counts  were verified correct at completion of the operation.      Valentina Gu. Roxy Manns, M.D.  Electronically Signed     CHO/MEDQ  D:  06/27/2008  T:  06/27/2008  Job:  [id number]   cc:   Denice Bors. Stanford Breed, MD, [facility]  Bruce R. Olevia Perches,  MD, [facility]  Jama Flavors. [place] Pulling, M.D.

## 2010-07-21 NOTE — Assessment & Plan Note (Signed)
OFFICE VISIT   [name], [name]  DOB:  Sep 06, 1940                                        October 03, 2006  CHART #:  [id number]   HISTORY OF PRESENT ILLNESS:  The patient returned for a routine followup  and routine rhythm check now nearly 3 months status post aortic root  replacement, mitral valve repair, resection and grafting of the  ascending thoracic aortic aneurysm and a left-sided MAZE procedure. She  was last seen here in the office on August 22, 2006. Since then, the  patient has done well. She was seen by Dr.  Stanford Breed in early July, and  at that time she tried to come off of Lasix. However, a little over a  week ago, she did develop some shortness of breath consistent with  clinical exacerbation of acute diastolic congestive heart failure. She  was restarted on Lasix and her symptoms promptly resolved. She has  otherwise done remarkably well and she continues to participate in a  cardiac rehab program. Her exercise tolerance is quite good. She denies  any tachy palpitations or dizzy spells. She denies any shortness of  breath. She has not had any lower extremity edema. The remainder of her  review of systems is unrevealing. The remainder of her past medical  history is unchanged.   CURRENT MEDICATIONS:  1. Prevacid 30 mg daily.  2. Actos 45 mg daily.  3. Amiodarone 100 mg daily.  4. Micardis 20 mg daily.  5. Toprol XL 75 mg daily.  6. Aspirin 81 mg daily.  7. Lasix 20 mg daily.  8. Potassium chloride 20 mEq daily.  9. Lipitor 10 mg daily.  10.Metformin 2000 mg daily.  11.Coumadin 2 mg daily.   PHYSICAL EXAMINATION:  Is notable for a well-appearing female with blood  pressure of 116/69, pulse 80 and 2 channel telemetry rhythm strip  demonstrates normal sinus rhythm.  HEENT: Examination is unrevealing.  Examination of the chest reveals a median sternotomy incision that has  healed completely. The sternum is stable on palpation. Breath sounds are  clear and symmetrical bilaterally. No wheezes or rhonchi are noted.  CARDIOVASCULAR: Includes regular, rate and rhythm. No murmurs, rubs or  gallops are appreciated.  ABDOMEN: Soft and nontender.  EXTREMITIES: Are warm and well perfused. There is no lower extremity  edema. The remainder of her physical examination is unrevealing.   DIAGNOSTIC TESTS:  Chest x-ray obtained today at the [facility] is reviewed. This demonstrates that the small previous right  pleural effusion has resolved completely. The lung fields are clear and  there is no pleural effusion at all remaining. There is no pulmonary  edema. All of the sternal wires appear intact. No other abnormalities  are noted.   IMPRESSION:  Satisfactory progress now nearly three months status post  aortic root replacement with mitral valve repair, resection and grafting  of ascending thoracic aorta and a left-sided MAZE procedure. The patient  appears to be maintaining sinus rhythm. She remains on low dose  amiodarone. She remains on Coumadin and she has had no problems with  Coumadin or bleeding diaphysis.   PLAN:  We will leave further longterm management of her medical issues  to Dr.  Stanford Breed and Dr.  Redmond Pulling. It would be reasonable to consider  stopping amiodarone in the near future  if she continues to do well. I  would favor stopping amiodarone first, and reconsider stopping Coumadin  at a later date if her rhythm remains stable off of amiodarone.  We will  plan to see her back for routine rhythm check in three months.   Valentina Gu. Roxy Manns, M.D.  Electronically Signed   CHO/MEDQ  D:  10/03/2006  T:  10/03/2006  Job:  [id number]   cc:   Denice Bors. Stanford Breed, MD, [facility]  Jama Flavors. Redmond Pulling, M.D.

## 2010-07-21 NOTE — Assessment & Plan Note (Signed)
[place] OFFICE NOTE   MADYX, WHYNOT                          MRN:          [id number]  DATE:07/06/2007                            DOB:          [birth year]    [name] is a pleasant female who has a history of aortic valve  replacement, aortic root replacement, mitral valve repair and Maze  procedure for atrial fibrillation.  I last saw her on June 02, 2007.  Note she has had what we have felt to be an iron deficiency anemia.  She  has been scheduled for an EGD and a colonoscopy tomorrow.  She is  presently on Lovenox bridge.  When I last saw her, she was complaining  of some degree of dyspnea.  We did repeat her echocardiogram on June 02, 2007.  Her LV function was normal.  There was moderate LVH.  The  tissue aortic valve prosthesis appeared to be working well and there was  mild aortic insufficiency.  There was mild mitral regurgitation.  There  was increased mitral flow gradient but it was not quantitated.  When I  last saw her, we also increased her diuretics. We went from 20 of Lasix  today to 40 of Lasix today.  Her initial BNP was 386 but it did improve  to 158.  She has not had chest pain, palpitations or syncope but she  does continue to have some dyspnea on exertion.   CURRENT MEDICATIONS:  1. Actos 45 mg p.o. daily.  2. Micardis 20 mg p.o. daily.  3. Toprol 100 mg p.o. daily.  4. Aspirin 81 mg p.o. daily.  5. Lasix 40 mg p.o. daily.  6. Potassium 20 mEq p.o. daily.  7. Lipitor 10 mg p.o. daily.  8. Metformin 2000 mg p.o. daily.  9. Lovenox.  10.Coumadin is presently on hold.   PHYSICAL EXAMINATION:  Blood pressure of 126/60 and pulse of 78.  She  weighs 196 pounds.  HEENT:  Normal.  NECK:  Supple.  CHEST:  Clear.  CARDIOVASCULAR:  Regular rate and rhythm.  There is 2/6 systolic murmur  at the left sternal border.  ABDOMEN:  No tenderness.  EXTREMITIES:  Shows no edema.   Her  electrocardiogram shows a sinus rhythm at a rate of 80.  There are  no significant ST changes noted.   DIAGNOSIS:  1. Continued dyspnea - the etiology of this remains unclear to me.  I      do not find her significantly volume overloaded on exam and her      recent BNP was not severely elevated.  She did state that the      higher dose of Lasix did really not improve her symptoms and so we      will resume at 20 mg p.o. daily and she can take an additional 20      as needed on a daily basis.  Her echocardiogram did show normal LV      function.  I will review this in terms of the mitral valve but  it      appears to be functioning reasonably well.  2. History of paroxysmal atrial fibrillation - she remains in sinus      rhythm today.  She will continue on her beta blocker.  Her Coumadin      will be resumed after her EGD/colonoscopy.  We are planning a      CardioNet monitor in 6 months and if there is no recurrent atrial      fibrillation, given that she has had a Maze procedure, we could      potentially discontinue her Coumadin.  Note her atrial appendage      has been ligated.  3. 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  4. Hypertension - her blood pressure is adequately controlled.  5. Diabetes mellitus.  6. Hyperlipidemia - she will continue on her statin.  7. History of abnormal chest CT - she is scheduled for a follow-up      chest CT on Monday.  8. Anemia - we will plan to repeat her CBC on Monday.  9. History of fibroids.  10.Coumadin therapy - this is being monitored in our Coumadin Clinic.   We will see back in 3 months.     [name], MD, [facility]  Electronically Signed    BSC/MedQ  DD: 07/06/2007  DT: 07/06/2007  Job #: [id number]

## 2010-07-21 NOTE — Assessment & Plan Note (Signed)
OFFICE VISIT   [name], [name]  DOB:  05-Oct-1940                                        Jul 17, 2007  CHART #:  [id number]   HISTORY OF PRESENT ILLNESS:  [name] returns for follow-up now 1 year  status post aortic root replacement, mitral valve repair, resection in  grafting of the ascending thoracic aortic aneurysm and a left-sided maze  procedure.  She was last seen here in the office 01/09/07.  Since then  [name] was diagnosed with iron-deficient anemia and she apparently has  undergone colonoscopy and upper GI endoscopy, each with no pathologic  findings of significance.  She apparently is now scheduled for a capsule  endoscopy.  She started taking iron in March, and recently she has  really started to feel much better.  She denies exertional shortness of  breath.  She has not had any palpitations in a long time.  Overall she  feels quite well and she has no other complaints.  The remainder of her  past medical history is unchanged.   CURRENT MEDICATIONS:  1. Metoprolol 100 mg daily.  2. Coumadin 4 mg daily.  3. Prilosec over-the-counter 20 mg daily.  4. Metamucil daily.  5. Actos 45 mg daily.  6. Micardis 20 mg daily.  7. Aspirin 81 mg daily.  8. Lasix 20 mg daily.  9. Potassium chloride 20 mEq daily.  10.Lipitor 10 mg daily.  11.Metformin 500 2000 mg daily.  12.Iron capsule daily, although the iron has been on hold for the last      few weeks during her GI evaluation.   PHYSICAL EXAM:  VITALS:  Notable for well-appearing female with blood  pressure 114/60, pulse 100 and regular.  Two channel telemetry rhythm  strip demonstrates normal sinus rhythm.  HEENT exam is unrevealing.  CHEST:  Clear to auscultation and breath sounds are symmetrical.  CARDIOVASCULAR EXAM:  Includes regular rate and rhythm.  No murmurs,  rubs or gallops are noted.  ABDOMEN:  Soft, nontender.  EXTREMITIES:  Warm and well-perfused.  There is no lower extremity   edema.   DIAGNOSTIC TESTS:  Chest CT scan performed 07/10/07 is reviewed.  This  was compared with previous CT scans due to the presence of benign-  appearing pulmonary nodules and questioned lymphadenopathy.  Today's CT  scan demonstrates that all of the previous small nodules in the right  upper and right lower lobes are stable and consistent with post  inflammatory scarring.  The previous mediastinal lymphadenopathy has  improved, confirming likelihood this was related to postsurgical  inflammation and/or congestive heart failure.  The patient does have a  new 1 cm ground glass opacity in the right upper lobe.  This was not  seen on previous scans and therefore is probably benign, although  atypical neoplasm cannot be ruled out.   Results of 2-D echocardiogram performed 06/02/07 is reviewed.  This  reveals a normal functioning aortic valve homograft with a mitral  annuloplasty ring and mild mitral regurgitation.  Left ventricular  function is essentially normal with ejection fraction estimated 55%.  There is some septal dyssynergy by report.  No other abnormalities were  noted.   IMPRESSION:  [name] seems to be doing quite well now.  She is  currently maintaining sinus rhythm.  She remains on Coumadin but  has  been off amiodarone.  She is scheduled to have a cardiac monitor  sometime in August.  She does have a new ground glass opacity in the  right lung seen on follow-up CT scan, and all of the previous benign-  appearing nodules and lymphadenopathy seen on old scans is stable or  improved.   PLAN:  We will see [name] back in 6 months with a follow-up  noncontrast chest CT scan to follow-up the ground-glass opacity in the  right lung.   Valentina Gu. Roxy Manns, M.D.  Electronically Signed   CHO/MEDQ  D:  07/17/2007  T:  07/17/2007  Job:  [id number]   cc:   Denice Bors. Stanford Breed, MD, [facility]

## 2010-07-21 NOTE — Assessment & Plan Note (Signed)
[place] OFFICE NOTE   NAME:BEALAllexandra, Bertling                          MRN:          [id number]  DATE:06/06/2007                            DOB:          [birth year]    REASON FOR CONSULTATION:  Anemia.   REASON:  Mrs. Pageau is a pleasant 70 year old white female referred  through the courtesy of Dr Stanford Breed for evaluation.  Routine testing  demonstrated a microcytic anemia.  On April 13, 2007 hemoglobin was  8.7, MCV was 78.  Mrs. Massie is on Coumadin.  She is on no gastric  irritants including nonsteroidals.  She does take a baby aspirin a day.  She has a history of colon polyps and was last examined in 2005, where  her exam was negative for polyps.  She denies change of bowel habits,  abdominal pain, melena, hematochezia, pyrosis or dysphagia.   PAST MEDICAL HISTORY:  Pertinent for paroxysmal atrial fibrillation.  She has a history of  CVA, hypertension, diabetes and hyperlipidemia.  She is on Coumadin therapy.  She is status post aortic root replacement,  mitral valve repair and Maze procedure for atrial fibrillation.   FAMILY HISTORY:  Pertinent for a grandmother and uncle with colon  cancer.   MEDICATIONS:  Include Prevacid alternating with Prilosec, Actos,  Micardis, metoprolol, baby aspirin, Lasix, potassium, Coumadin, Lipitor,  Metformin and iron.  She has no allergies.   She neither smokes nor drinks.  She is married and retired.   REVIEW OF SYSTEMS:  Positive for shortness of breath.   EXAMINATION:  Pulse 82, blood pressure 130/70, weight 195.  HEENT: EOMI.  PERRLA.  Sclerae are anicteric.  Conjunctivae are pink.  NECK:  Supple without thyromegaly, adenopathy or carotid bruits.  CHEST:  Clear to auscultation and percussion without adventitious  sounds.  CARDIAC:  Regular rhythm; normal S1 S2.  There are no murmurs, gallops  or rubs.  ABDOMEN:  Bowel sounds are normoactive.  Abdomen is soft,  nontender and  nondistended.  There are no abdominal masses, tenderness, splenic  enlargement or hepatomegaly.  EXTREMITIES:  Full range of motion.  No cyanosis, clubbing or edema.  RECTAL:  Deferred.   IMPRESSION:  1. Iron deficiency anemia.  Chronic gastrointestinal blood loss is a      concern.  Arteriovenous malformation is associated with aortic      stenosis.  Bleeding polyps and less likely, neoplasm and active      peptic disease, are other concerns.  2. Atrial fibrillation - on Coumadin.  3. Status post aortic valve replacement.   RECOMMENDATIONS:  1. Colonoscopy and upper endoscopy.  This will be done while on a      Lovenox window after discontinuing her Coumadin.  2. Serial Hemoccults.  3. To consider capsule endoscopy if the above tests are negative.     Sandy Salaam. Deatra Ina, MD,FACG  Electronically Signed    RDK/MedQ  DD: 06/06/2007  DT: 06/06/2007  Job #: [id number]   cc:   Denice Bors. Stanford Breed, MD, [facility]  Jama Flavors. Redmond Pulling, M.D.

## 2010-07-21 NOTE — Assessment & Plan Note (Signed)
OFFICE VISIT   [name], [name]  DOB:  October 11, 1940                                        August 22, 2006  CHART #:  [id number]   The patient returns to the office today for further followup status post  aortic root replacement, mitral valve repair, resection and grafting of  the ascending thoracic aortic aneurysm, and left sided Maze procedure  all performed on 05/012008.  She was last seen here in the office on  08/10/2006.  At that time there was some concern regarding a small to  moderate size right pleural effusion and symptoms of shortness of  breath.  She had been having problems with postoperative atrial  fibrillation as well.  Since then she has done remarkably well.  She  states that several days later she started to feel much better, and  since then she has continued to improve.  She denies any tachy  palpitations.  Her shortness of breath is much better and she now denies  any significant shortness of breath, although she does get tired with  activity.  She still complains of poor appetite with intermittent  episodes of nausea.  The remainder of her review of systems is  unrevealing.  Overall she looks and feels much better.   PHYSICAL EXAMINATION:  Notable for a well-appearing female with blood  pressure 101/58, pulse is 88 and regular, and 2-channel telemetry rhythm  strip demonstrates what appears to be sinus rhythm.  Examination of the  chest reveals a median sternotomy incision that is healing nicely.  The  sternum is stable on palpation.  Breath sounds are clear with perhaps  slightly diminished breath sounds at the right lung base, no wheezes or  rhonchi are noted.  Cardiovascular:  Exam includes regular rate and  rhythm.  No murmurs, rubs, or gallops are appreciated.  The abdomen is  soft, nontender.  The extremities are warm and well profused.  There is  no lower extremity edema.   Diagnostic [facility] is  reviewed.  This demonstrates very small residual right pleural effusion  which is decreased in comparison with chest x-ray obtained earlier this  month.  There is mild right lower lobe atelectasis.  No other  abnormalities are noted.   Report from transthoracic echocardiogram performed 08/17/2006 at the  [facility] Office is reviewed.  By report this report  demonstrated moderate left ventricular chamber dilatation with ejection  fraction estimated between 40% and 45%.  There was no significant mitral  regurgitation or mitral stenosis status post mitral valve repair.  There  is bioprosthetic tissue valve in the aortic position which appeared to  be functioning normally with the appropriate small jet of aortic  insufficiency.  The measured gradient across the aortic valve was  estimated 9 mmHg.  There is no pericardial effusion.   IMPRESSION:  Stable progress with considerable symptomatic improvement  over the last few weeks.  Overall the patient appears to be doing well.  Her right pleural effusion appears to be resolving with the oral  diuretic therapy.  Her rhythm remains stable.  She continues to have  some nausea and poor appetite, perhaps related to amiodarone.   PLAN:  I have instructed the patient to cut back her dose of amiodarone  to 200 mg daily next  week.  She has been keeping track of her pulse and  blood pressure and if her heart rate begins to increase it might be  reasonable to increase her Lopressor.  She will remain on Lasix for the  time being.  She plans to see Dr. Stanford Breed for further followup in early  July.  We will plan to see her back for further followup in 6 weeks.  We  will obtain another chest x-ray at that time to make sure that her  pleural effusion has continued to resolve.   Valentina Gu. Roxy Manns, M.D.  Electronically Signed   CHO/MEDQ  D:  08/22/2006  T:  08/22/2006  Job:  [id number]   cc:   Denice Bors. Stanford Breed, MD, [facility]  Jama Flavors. Redmond Pulling,  M.D.

## 2010-07-21 NOTE — Cardiovascular Report (Signed)
[name], [name]                   ACCOUNT NO.:  [id number]   MEDICAL RECORD NO.:  [id number]          PATIENT TYPE:  INP   LOCATION:  4705                         FACILITY:  [place]   PHYSICIAN:  Vanna Scotland. Olevia Perches, MD, FACCDATE OF BIRTH:  April 18, 1940   DATE OF PROCEDURE:  06/24/2008  DATE OF DISCHARGE:                            CARDIAC CATHETERIZATION   CLINICAL HISTORY:  [name] is 70 years old and in 2008 had aortic valve  replacement with an aortic root graft for an aortic aneurysm with  reimplantation of her coronary arteries.  She also had mitral valve  repair.  This was done for aortic stenosis.  She has done reasonably  well since that time, although she has had difficulty with atrial  arrhythmias.  She was recently admitted with atrial tachycardia and  atrial flutter and atrial fibrillation and her troponins returned  positive at greater than 5.  Her rate was controlled and she was  scheduled for further evaluation with catheterization.   PROCEDURE:  The procedure was performed via the right femoral artery and  arterial sheath and 5-French preformed coronary catheters.  A front wall  arterial puncture was performed and Omnipaque contrast was used.  We  were unable to selectively engage the left main coronary artery and  reassess this by an aortic root.  Both the left ventriculogram and  aortic root injection were performed.  The patient tolerated the  procedure well and left laboratory in satisfactory condition.   RESULTS:  Right coronary artery:  The right coronary artery was a large  dominant vessel that gave rise to a conus branch, 2 right ventricular  branch, a posterior descending branch, and a posterolateral branch.  This vessel was free of significant disease.   Left main coronary artery:  The left main coronary artery was total with  a flush total occlusion at the aortic root documented by an aortic root  injection.  The LAD, circumflex, and left main filled via  collaterals  from the right coronary artery and filled all the way back within 1-2 cm  from the left main.   Left ventriculogram:  The left ventriculogram performed in the RAO  projection showed a hypokinesis of the anterolateral wall and out to the  apex.  The inferior wall moved well.  The estimated ejection fraction  was 25-30%.   Aortic root injection.  The aortic root injection showed the aortic root  graft intact.  There was no aortic insufficiency.  The left main artery  was occluded at its origin.   HEMODYNAMIC DATA:  The aortic pressure was 136/62 with a mean of 89.  Left ventricular pressure was 136/9.   CONCLUSION:  Coronary artery disease status post aortic valve  replacement with an aortic root graft and a tissue aortic valve and  reimplantation of the coronary arteries and status post mitral valve  repair with a ring with occlusion of the left main coronary artery at  its origin, no major obstruction in the right coronary artery, and new  left ventricular dysfunction with anterolateral wall and apical wall  hypokinesis and estimated fraction of 25-30%.   RECOMMENDATIONS:  The patient has occluded the left main at the site of  the reimplantation.  This is not likely acute.  A recent infarct is most  likely a demand infarct from her high rates in the chronic total  occlusion.  It appears that her anterior wall and apex are viable and  surgical revascularization may be the best option.  I discussed these  findings with Dr. Stanford Breed and we will obtain consultation from the  cardiac surgery group.      Bruce Alfonso Patten Olevia Perches, MD, [facility]  Electronically Signed     BRB/MEDQ  D:  06/24/2008  T:  06/25/2008  Job:  [id number]   cc:   Denice Bors. Stanford Breed, MD, [facility]  Cardiopulmonary Lab

## 2010-07-21 NOTE — Assessment & Plan Note (Signed)
[place] OFFICE NOTE   CLOEE, HONEGGER                          MRN:          [id number]  DATE:02/05/2008                            DOB:          1941-01-06    [name] is a pleasant female who has a history of aortic valve  replacement, aortic root replacement, mitral valve repair, and maze  procedure for atrial fibrillation.  Her most recent echocardiogram was  performed on June 02, 2007.  Her LV function was normal.  There was  mild aortic insufficiency.  There was mild mitral regurgitation.  The  left atrium is mildly dilated.  We also recently performed a CardioNet  monitor on [name].  She was having bursts of atrial fibrillation with  a rapid ventricular response.  She denies any dyspnea, chest pain, or  syncope.  She does occasionally have palpitations and these occur  predominantly at night.  She had one that was a little more prolonged  lasting until 2 o'clock in the morning.   Her medications include:  1. Vitamin D.  2. Iron.  3. Prilosec.  4. Actos 45 mg p.o. daily.  5. Micardis 20 mg p.o. daily.  6. Aspirin 81 mg p.o. daily.  7. Potassium daily.  8. Metformin 1 g p.o. nightly.  9. Coumadin as directed.  10.Lasix 20 mg p.o. daily.  11.Toprol 125 mg p.o. daily.   Physical exam today shows a blood pressure of 121/55 and a pulse of 76.  She weighs 198 pounds.  Her HEENT is normal.  Her neck is supple.  Her  chest is clear.  Cardiovascular reveals a regular rate and rhythm.  Abdominal exam shows no tenderness.  Extremities show no edema.   Her electrocardiogram shows a sinus rhythm at a rate of 74.  A prior  septal infarct cannot be excluded.  There is left axis deviation.   DIAGNOSES:  1. Paroxysmal atrial fibrillation - [name] continues to have      episodes of atrial fibrillation with an elevated heart rate.  We      will increase her Toprol to 150 mg p.o. daily.  I am also  hesitant      to discontinue her Coumadin given the recurrent atrial      fibrillation.  Note, her atrial appendage has been ligated, but she      does have a history of an embolic cerebrovascular accident.  This      was felt possibly secondary to oscillating calcium on her aortic      valve, but certainly I cannot exclude thrombus given her history of      paroxysmal atrial fibrillation.  If her symptoms worsen in the      future, we can consider an antiarrhythmic, although she does not      like the idea of taking more medications.  I do not think we need      to consider referral for atrial fibrillation ablation as she is      relatively asymptomatic and she has not failed  an antiarrhythmic.      Note, I spent approximately 30 minutes in the office today      discussing the options of atrial fibrillation therapy with she and      her husband.  2. 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  3. Hypertension - her blood pressure is adequately controlled on her      present medications.  4. Diabetes mellitus.  5. Hyperlipidemia - [name] has discontinued her Lipitor on her own      as she was complaining of potential side effects.  She feels it      maybe contributing to her anemia, fatigue, and she also thought it      maybe causing dry mouth.  She will remain off these medications for      now, and I will ask her to reconsider this in the future.  6. History of abnormal chest CT - Dr. Roxy Manns is following this.  7. Anemia.  8. History of fibroids.  9. Coumadin therapy - will continue to be managed in the Coumadin      Clinic.   I will see her back in approximately 3 months.     [name], MD, [facility]  Electronically Signed    BSC/MedQ  DD: 02/05/2008  DT: 02/06/2008  Job #: [id number]

## 2010-07-21 NOTE — Assessment & Plan Note (Signed)
OFFICE VISIT   [name], [name]  DOB:  19-Jan-1941                                        August 10, 2006  CHART #:  [id number]   The patient returns Hamilton the office today for followup status post aortic  root replacement, mitral valve repair, resection and grafting of  ascending thoracic aortic aneurysm and left-sided MAZE procedure  performed on Jul 07, 2006. Her early postoperative recovery was  uneventful. Following hospital discharge, she initially did well,  although she subsequently developed exertional shortness of breath after  she had been home approximately two weeks. She presented for cardiac  rehab and was found Hamilton have high pulse rate and was referred Hamilton the  emergency room. She was seen on May 29 and noted Hamilton be in rapid atrial  fibrillation. At the time the dose of her amiodarone was doubled Hamilton 200  mg twice daily. She was also given intravenous amiodarone while she was  in the emergency room and during this infusion she converted back Hamilton  sinus rhythm. Of note, her prothrombin time was supra therapeutic and  her Coumadin dose was also adjusted at the time of this visit. [name]  was seen yesterday in the office by Dr.  Olevia Perches at the [facility]  Cardiology team. She continued Hamilton report some exertional shortness of  breath, and a chest x-ray was performed documenting a new right pleural  effusion and right lower lobe atelectasis. She was referred back Hamilton our  office today for further management and treatment with consideration of  possible elective thoracentesis for therapeutic benefit.   [name] reports overall she feels well. She states that she did great  for the first two weeks after she went home, but for the last ten days  or so she has noted some mild exertional shortness of breath. She has  noted that her pulse rate has been fast as well. She has not had any  dizzy spells. She denies any resting shortness of breath. She denies any  lower  extremity edema. She has a poor appetite. Her bowels are  functioning and she has not had abdominal discomfort. Remainder of her  review of systems is unrevealing.   PHYSICAL EXAMINATION:  Is notable for a well-appearing female with blood  pressure of 107/62, pulse 92 and regular. Oxygen saturation is 95% on  room air.  CHEST: Demonstrates clear breath sounds with mildly decreased breath  sounds at the right lung base. No wheezing or rhonchi are noted.  CARDIOVASCULAR: Includes regular rate and rhythm. No murmurs, rubs or  gallops are noted. Median sternotomy incision is healing nicely and the  sternum is stable on palpation.  ABDOMEN: Soft and nontender.  EXTREMITIES: Warm and well perfused. There is no lower extremity edema.  The remainder of her physical examination is unrevealing.   DIAGNOSTIC TESTS:  Chest x-ray obtained yesterday at the [facility]  Cardiology offices reviewed. This demonstrates small Hamilton moderate-sized  right pleural effusion with right lower lobe atelectasis and right lower  lobe opacity. All the sternal wires appear intact. The left lung field  appears fairly clear. In comparison Hamilton the last chest x-ray obtained  prior Hamilton hospital discharge in May, is notable for the absence of any  significant right pleural effusion. Prothrombin time INR drawn yesterday  at the [facility]  was notable for an INR measured 5.0.   IMPRESSION:  Overall, [name] looks well, although she has had some  recurrent paroxysmal atrial fibrillation and mild Hamilton moderate exertional  shortness of breath. She clinically looks good and she does not appear  Hamilton be in severe fluid overload. She does not have a large pleural  effusion, but there is certainly enough fluid there that she might enjoy  some symptomatic improvement with thoracentesis. However, I do not feel  that thoracentesis would be wise with her INR as high as it is and she  certainly is in no respiratory distress at the  present.   PLAN:  [name] will continue oral diuretic therapy as prescribed  yesterday by Dr.  Olevia Perches. She continues Hamilton have her prothrombin time  adjusted through the [facility]. We will plan Hamilton see her  back in followup on June 16th with a repeat chest x-ray. [name] knows  that if her INR has not been brought down Hamilton 2.0 or less at the time it  is checked next week at the [facility] Coumadin office, she will stop  taking Coumadin altogether for the last several days prior Hamilton her office  appointment with me. If her effusion remains substantial or gets worse,  we will proceed with thoracentesis at that time. All of her questions  have been addressed.   [name], M.D.  Electronically Signed   CHO/MEDQ  D:  08/10/2006  T:  08/10/2006  Job:  [id number]   cc:   Denice Bors. Stanford Breed, MD, [facility]  Bruce R. Olevia Perches, MD, [facility]  Jama Flavors. Redmond Pulling, M.D.

## 2010-07-21 NOTE — Assessment & Plan Note (Signed)
[name]                            CARDIOLOGY OFFICE NOTE   NAME:BEALJonylah, Hamilton                          MRN:          [id number]  DATE:06/02/2007                            DOB:          [birth year]    HISTORY:  [name] is a pleasant female who has a history of aortic  valve replacement, aortic root replacement, mitral valve repair and Maze  procedure for atrial fibrillation.  I last saw her on April 13, 2007.  At that time, we did check a CBC.  Note, her hemoglobin was 8.7,  hematocrit of 26.9, MCV was 78.1.  We did ask her to follow up with her  primary care physician concerning her anemia.  She had a ferritin  checked which was low at 7.  She was placed on iron for iron deficiency  anemia.  She states since then, she has had problems with dyspnea on  exertion.  There is no orthopnea, PND, pedal edema, palpitations,  presyncope, syncope or chest pain.   MEDICATIONS:  1. Metamucil.  2. Actos 45 mg p.o. daily.  3. Micardis 20 mg p.o. daily.  4. Aspirin 81 mg p.o. daily.  5. Lasix 20 mg p.o. daily.  6. Potassium 20 mEq p.o. daily.  7. Coumadin as directed.  8. Lipitor 10 mg p.o. daily.  9. Metformin 2 gm p.o. q.h.s.  10.Prevacid 30 mg p.o. daily.  11.Metoprolol ER 100 mg p.o. daily.  12.Iron.   PHYSICAL EXAMINATION:  VITAL SIGNS:  Blood pressure 119/67, pulse of 91.  She weighs 196 pounds.  HEENT:  Normal.  NECK:  Supple.  I cannot appreciate jugular venous distention.  CHEST:  Clear.  CARDIOVASCULAR:  Regular rate and rhythm.  There is a 2/6 systolic  murmur at the left sternal border.  There is no murmur at the apex.  ABDOMEN:  Shows no tenderness.  EXTREMITIES:  Show no edema.   DIAGNOSTICS:  Her electrocardiogram shows a sinus rhythm at a rate of  91.  There is left axis deviation.  There are no significant ST changes.  There are PACs.   DIAGNOSES:  1. Worsening dyspnea on exertion - etiology of this is unclear to me.  There certainly may be a component related to her anemia.  I do not      find her significantly volume overloaded on exam.  However, we will      increase her Lasix to 40 mg p.o. daily.  I will check a BNP today      as well as a BMET and a BMP in 1 week.  Hopefully, the higher dose      of diuretic will help her symptoms and as her hemoglobin improves      this will also help.  I do not think she has had a pulmonary      embolus as she has been on chronic Coumadin.  We will plan to      repeat her echocardiogram to reassess her valve function, although      I think it sounds unchanged on examination.  2. Mildly reduced left ventricular function - she will continue on her      beta-blocker, Micardis and diuretic.  3. History of paroxysmal atrial fibrillation - she continues to be in      sinus rhythm.  We will continue with her beta blocker as well as      her Coumadin.  We are planning a CardioNet monitor in 6 months.  If      there is no recurrent atrial fibrillation at that time, then we may      consider discontinuing her Coumadin as she did have a Maze      procedure and had her atrial appendage ligated with a previous      surgery.  4. History of embolic cerebrovascular accident felt secondary to an      oscillating calcium on her aortic valve.  5. Hypertension - her blood pressure is adequately controlled.  6. Diabetes mellitus.  7. Hyperlipidemia - she will continue on her statin.  8. History of abnormal chest CT - she will need follow up chest CT in      May.  9. History of fibroids.  10.Coumadin therapy - this is being monitored in the Coumadin Clinic.   FOLLOW UP:  I will see her back in 4 weeks.     Karen Bors Stanford Breed, MD, [facility]  Electronically Signed    BSC/MedQ  DD: 06/02/2007  DT: 06/03/2007  Job #: [id number]

## 2010-07-21 NOTE — Assessment & Plan Note (Signed)
[place] OFFICE NOTE   CEREN, HORNBAKER                          MRN:          [id number]  DATE:05/06/2008                            DOB:          08-12-1940    Ms. Madero is a pleasant female, who has a history of aortic valve  replacement, aortic root replacement, mitral valve repair, maze  procedure for atrial fibrillation.  Her last echo in March 2009 showed  normal LV function, mild aortic insufficiency, and mild mitral  regurgitation.  She has also had a recent CardioNet that showed a burst  of atrial fibrillation.  Since I saw her on March 11, 2008, she  continues to have occasional bouts of atrial fibrillation.  She states  her heart rate will go as high as 170 and then she develops a headache.  However, predominantly she is in the 90 range.  In the past 1 month, she  has had no recurrent episodes.  She apparently has had most of these  episodes at night, and she has developed a procedure where she lays down  only 5 minutes after sitting on her bed and this apparently is helping.  She otherwise does not have dyspnea, chest pain, or syncope.   MEDICATIONS:  1. Metoprolol ER 100 mg in the morning and 75 in the evening.  2. Iron.  3. Prilosec.  4. Actos 45 mg p.o. daily.  5. Micardis 20 mg p.o. daily.  6. Aspirin 81 mg p.o. daily.  7. Potassium 20 mEq p.o. daily.  8. Metformin.  9. Coumadin.  10.Lasix 20 mg p.o. daily.   PHYSICAL EXAMINATION:  VITAL SIGNS:  Blood pressure of 112/66 and pulse  is 73.  She weighs 199 pounds.  HEENT:  Normal.  NECK:  Supple.  CHEST:  Clear.  CARDIOVASCULAR:  Irregular rhythm.  ABDOMEN:  No tenderness.  EXTREMITIES:  No edema.   DIAGNOSES:  1. Paroxysmal atrial fibrillation - Ms. Smyth did have frequent      episodes after I saw her on March 11, 2008, but over the past      month, these apparently have improved.  We again had discussions      today concerning  the options including rate control,      anticoagulation versus adding an antiarrhythmic such as flecainide.      She appears to be somewhat nervous about flecainide and we      therefore elected to continue with the same strategy at this point.      If her episodes worsen in the future, then we will add flecainide      at that time.  She will also continue on Coumadin with goal INR of      2-3.  2. History of embolic cerebrovascular accident felt secondary to      ostium calcium on her aortic valve.  3. Hypertension - her blood pressure is adequately controlled on      present medications.  4. Diabetes mellitus.  5. Hyperlipidemia - Ms. Brull had declined the statin and we will  reconsider this in the future.  6. History of anemia.  7. History of fibroids.  8. Coumadin therapy.   I will see her back in 3 months or sooner if necessary.     [name], MD, [facility]  Electronically Signed    BSC/MedQ  DD: 05/06/2008  DT: 05/07/2008  Job #: [id number]

## 2010-07-21 NOTE — Assessment & Plan Note (Signed)
[place] OFFICE NOTE   KENZY, HYSMITH                          MRN:          [id number]  DATE:03/11/2008                            DOB:          21-Apr-1940    [name] returns for followup.  She has a history of aortic valve  replacement, aortic root replacement, mitral valve repair, and maze  procedure for atrial fibrillation.  She did have an echocardiogram in  March 2009.  LV function was normal, there was mild aortic insufficiency  and mild mitral regurgitation.  The left atrium is mildly dilated.  Also  of note that she has had a recent CardioNet that showed burst of atrial  fibrillation.  I last saw her on February 05, 2008.  Since then, she is  continuing to have problems with recurrent atrial fibrillation.  She  states that her heart will suddenly start pounding.  These occur  predominantly in the evening.  There is an associated discomfort in her  throat, but there is no syncope.  She also develops a headache with  this.  There is no other exertional chest pain, dyspnea on exertion,  orthopnea, PND, or pedal edema.   Her medications include vitamin D, iron, and Toprol 150 mg p.o. daily.  She is also on Prilosec, Actos, Micardis 20 mg daily, aspirin 81 mg p.o.  daily, potassium 20 mEq p.o. daily, metformin 2 g p.o. at bedtime,  Coumadin, and Lasix 20 mg p.o. daily.   PHYSICAL EXAMINATION:  VITAL SIGNS:  Blood pressure of 138/70 and her  pulse of 60.  She weighs 194 pounds.  HEENT:  Normal.  NECK:  Supple.  CHEST:  Clear.  CARDIOVASCULAR:  Regular rate and rhythm.  ABDOMEN:  No tenderness.  EXTREMITIES:  No edema.   Electrocardiogram shows a sinus rhythm at a rate of 62.  There are no  significant ST changes noted.   DIAGNOSES:  1. Recurrent paroxysmal atrial fibrillation - [name] continues to      have episodes of atrial fibrillation.  She is symptomatic with      these.  We have had long  discussions today in the office concerning      the options.  One would be to increase rate control.  The second      would be adding an antiarrhythmic such as flecainide.  The third      would atrial fibrillation ablation, which I do not think is      appropriate at this point.  She has not failed an antiarrhythmic.      We have elected at this point to change her Toprol to 75 in the      morning and 100 in the evening.  She thinks this may be beneficial      as most of these episodes occur at night.  This is not worked and      we will plan to begin flecainide in the near future.  She will      continue on Coumadin with a goal INR of  2-3.  2. 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  3. Hypertension - her blood pressure is adequately controlled on      present medications.  4. Diabetes mellitus.  5. Hyperlipidemia - Ms. Windecker is offered statin and feel somewhat      better.  We will reconsider this in the future.  6. Anemia.  7. History of fibroids.  8. Coumadin therapy.   I will see her back in March as scheduled.     [name], MD, [facility]  Electronically Signed    BSC/MedQ  DD: 03/11/2008  DT: 03/12/2008  Job #: [id number]

## 2010-07-21 NOTE — Assessment & Plan Note (Signed)
[name]   [name], [name]                          MRN:          [id number]  DATE:01/09/2007                            DOB:          [birth year]    [name] is a very pleasant female who has a history of aortic valve  replacement, aortic root replacement, mitral valve repair, and maze  procedure for atrial fibrillation.  Her most recent echocardiogram was  performed on August 17, 2006.  Her LV function was in the 40-45% range.  There was a tissue aortic valve with mild aortic insufficiency.  The  mitral valve was status post repair with no significant mitral stenosis  or mitral regurgitation.  Since I last saw her, she had an episode of  palpitations and called our office.  She had a CardioNet monitor and has  been found to have episodes of paroxysmal atrial fibrillation.  There  was also a brief nonsustained ventricular tachycardia 7 beats.  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  Since I last saw her, she is  doing well from symptomatic standpoint.  She denies any dyspnea, chest  pain, presyncope, syncope, or pedal edema.  She did have 1 brief episode  of palpitations but in general has not.   MEDICATIONS AT PRESENT:  1. Prilosec 20 mg p.o. daily.  2. Metamucil.  3. Actos 45 mg p.o. daily.  4. Micardis 20 mg p.o. daily.  5. Metoprolol ER 75 mg p.o. daily.  6. Aspirin 81 mg p.o. daily.  7. Lasix 20 mg p.o. daily.  8. Potassium 20 mEq p.o. daily.  9. Coumadin.  10.Lipitor 10 mg p.o. daily.  11.Metformin 2000 mg p.o. daily.   PHYSICAL EXAM:  Blood pressure 120/89 and a pulse of 68.  HEENT:  Normal.  NECK:  Supple.  CHEST:  Clear.  CARDIOVASCULAR:  Regular rate and rhythm.  There is a 2/6 systolic  ejection murmur at the left sternal border.  ABDOMEN:  No tenderness.  EXTREMITIES:  No edema.   Her electrocardiogram shows a sinus rhythm at a rate of 68.  There are  no ST changes  noted.   DIAGNOSES:  1. History of mild systolic heart failure - she is euvolemic on exam      and is having no dyspnea.  We will continue with the present      medications.  Given her diuretic and potassium use, I will check a      BMET today.  2. Paroxysmal atrial fibrillation - She is status post maze procedure      and had her atrial appendages ligated.  However, she is continuing      to have paroxysmal atrial fibrillation.  She will continue on her      Coumadin, and I will increase her Toprol to 100 mg p.o. daily.  We      can consider reinitiation of amiodarone in the future if necessary.  3. History of embolic cerebrovascular accident felt secondary to      oscillating calcium on her  aortic valve.  4. Hypertension - Her blood pressure is well controlled on her present      medications.  5. Diabetes mellitus.  6. Hyperlipidemia - She will continue on her statin.  7. History of abnormal chest CT - She had a repeat today, and we will      await those results.  8. Coumadin therapy - This is being monitored in our Coumadin clinic,      and our goal INR will be 2-3.  9. History of fibroids being followed by OB/GYN.   I will see her back in 3 months.     [name], MD, [facility]  Electronically Signed    BSC/MedQ  DD: 01/09/2007  DT: 01/10/2007  Job #: [id number]

## 2010-07-21 NOTE — Discharge Summary (Signed)
NAMEHOLDEN, HUESTON NO.:  [id number]   MEDICAL RECORD NO.:  [id number]          PATIENT TYPE:  INP   LOCATION:  2037                         FACILITY:  [facility]   PHYSICIAN:  Valentina Gu. Roxy Manns, M.D. DATE OF BIRTH:  Sep 01, 1940   DATE OF ADMISSION:  06/24/2006  DATE OF DISCHARGE:                               DISCHARGE SUMMARY   ADMISSION DIAGNOSES:  1. Blurred vision with diplopia.  2. Diabetes mellitus type 2.  3. Gastroesophageal reflux disease.  4. Aortic stenosis.  5. Diverticulosis.   DISCHARGE/SECONDARY DIAGNOSES:  1. 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  2. Severe aortic stenosis, status post aortic root replacement.  3. Moderate to severe mitral regurgitation, status post mitral valve      repair.  4. Recurrent paroxysmal atrial fibrillation, status post left-sided      maze procedure.  5. Ascending thoracic aorta aneurysm, 4.2 cm, status post resection      and grafting.  6. Less than 1 cm right upper and right lower lobe pulmonary nodules      of undetermined significance with 37-month follow-up CT scan      recommended.  7. Cholelithiasis.  8. Colonic diverticulosis.  9. Small hiatal hernia.  10.Uterine fibroids.  11.A 6-cm left adnexal mass, probable simple left ovarian cyst.  12.Gastroesophageal reflux disease with esophagitis approximately 4      years ago, resolved on medical therapy.  13.Probable multiple recent small embolic to the left hemisphere      without symptoms.  14.Diverticulitis in March of 2008 with previous episode in 1991.  15.Remote history of tobacco use.  16.Left foot surgery approximately 20 years ago.  17.Moderate left ventricular dysfunction with ejection fraction 45-50%      preoperatively.  18.Normocytic anemia.  19.Hyperlipidemia.  20.Mildly elevated troponin-I.  21.Postoperative fluid volume excess.  22.Postoperative sinus node dysfunction.  23.Postoperative acute  blood loss anemia.  24.Mild hypokalemia, supplemented.   ALLERGIES:  NO KNOWN DRUG ALLERGIES.   PROCEDURES:  1. On June 27, 2006, cardiac catheterization revealed nonobstructive      coronary artery disease with a nonobstructing-appearing lesion in      the ostium of the left coronary artery, moderate pulmonary      hypertension.  Large pulmonary capillary wedge pressure, V wave      consistent with mitral regurgitation.  2. On Jul 07, 2006, median sternotomy for homograft aortic root      replacement, mitral valve repair (26 mm [employer]), resection and grafting of ascending thoracic aortic      aneurysm, left-sided maze procedure, surgeon Dr. Darylene Price.   CONSULTS:  1. Cardiology, Dr. [name].  2. Neurology, Dr. Morene Antu.  3. Speech Therapy, Occupational Therapy, Physical Therapy.   DIAGNOSTICS:  1. On July 04, 2006, carotid duplex revealing no evidence of      significant internal carotid artery stenosis bilaterally, both      vertebral flow antegrade, and ankle-branchial indices greater than  1.0 bilaterally.  2. Pulmonary function tests showing moderately severe obstructive      airway disease, FEV-1 55% predicted.  3. Also on July 04, 2006, transvaginal ultrasound showing uterine      fibroid to be 6.2 cm simple appearing cystic lesion of the left      ovary.  4. On July 02, 2006, CT angio of the chest, abdomen, and pelvis      showing ascending thoracic aortic aneurysm 4.2 cm with no evidence      of dissection, mild interstitial edema, and mild bilateral pleural      effusions, less than 1 cm indeterminate pulmonary nodules in the      right upper and lower lobes with follow-up CT recommended in 6      months.  Cholelithiasis with diffuse colonic diverticulosis, small      hiatal hernia, colonic diverticulosis without evidence of      diverticulitis, multiple uterine fibroids, a 6-cm simple cyst in      the left adnexa  likely representing ovarian in origin, and      malignant features were identified, and mild hemorrhage in the      right groin and along external iliac vessels felt secondary to      recent heart catheterization.  5. On June 24, 2006, MRI/MRA of the head showing atrophy, chronic      microvascular ischemic change, a subcentimeter area of acute right      paramedian mid-grain infarction just central to the aqueduct.  Two      other areas of left hemisphere infarction fairly typical for a      water shed type pattern and more of a slightly different signal      intensity and could be older.  MRA of the intracranial circulation      was unremarkable.   BRIEF HISTORY:  The patient is a 70 year old female with a history of  diabetes mellitus and has been recently treated for diverticulitis  requiring ciprofloxacin and Flagyl.  They had recently been discontinued  prior to admission because she did not feel well.  Her diabetic medicine  had also been changed from Avandia to Actos about one month prior.  On  June 23, 2006, she woke up around 6:15 a.m. with complaints of doubled  vision and blurred vision relieved by closing one eye and worse when  looking to the left.  There was no other associated symptoms such as  headache, syncope, seizures, chest pain, nausea, vomiting, or vertigo.  She did feel lightheaded and noted a staggering gait when she got up to  walk, however.  She went to the emergency room around 9:00 a.m., and a  CT of the brain was obtained which was within normal limits.  She was  evaluated by neurologist, Dr. Morene Antu, who felt that she should be  admitted for further evaluation and treatment including aspirin, swallow  study, MRI and MRA for further evaluation.   HOSPITAL COURSE:  The patient was admitted to [facility] on  June 24, 2006 for blurred vision and diplopia.  As mentioned, she did  undergo an MRI revealing acute stroke.  Overnight, her  symptoms resolved.  She had no other neurologic findings whatsoever at that time.  The MRI also revealed two other small areas in the left hemisphere  suggestive of small embolic strokes in the recent past, although they  were presumably all completely asymptomatic.  She underwent a carotid  Duplex scan and an  MR angiogram of the brain which failed to reveal any  significant extracranial or intracranial cerebrovascular disease.  During her early hospitalization, she was noted to have intermittent  episodes of paroxysmal atrial fibrillation and atrial tachycardia, and a  cardiology consultation was obtained.  Of note, she has been told that  she had a heart murmur about 15-20 years ago and that it was most likely  secondary to mild aortic stenosis.  She denied any history of known  rheumatic fever or rheumatic heart disease.  She has not had any known  heart attack or atrial fibrillation or congestive heart failure in the  past.  Cardiologist, Dr. Stanford Breed, evaluated the patient on June 25, 2006.  A 2D echocardiogram was performed confirming the presence of  moderate aortic stenosis with mean transvalvular gradient of 35 mmHg,  peak transvalvular gradient 46 mmHg, and estimated aortic valve area of  0.99 cm squared.  Peak velocity across the valve was 339 cm per second.  Electrocardiogram was also notable for mild left ventricular dysfunction  with an ejection fraction estimated at 50% and severe mitral  regurgitation with regurgitant volume estimated at 40 mL and regurgitant  orifice area estimated at 0.18 cm squared.  Mitral valve appeared  restricted, and there was no sign of mitral valve prolapse.  She also  had a mildly elevated troponin-I of 0.26.  It is unclear if this  etiology is possibly related to her atrial fibrillation.  On April 20th,  Coumadin and Lovenox were added for anticoagulation therapy with her  history of recent embolic stroke and paroxysmal atrial fibrillation.   She subsequently underwent a left and right heart catheterization by Dr.  Burt Knack on June 27, 2006.  Results as discussed previously.  She was  continued on anticoagulation therapy because the initial thought was  that she could be discharged home once her INR was therapeutic and then  give her time to recover from her stroke and consider aortic and mitral  valvular repair in approximately 6 weeks.  However, she underwent a  transesophageal echocardiogram on July 01, 2006 by Dr. Stanford Breed, and  this confirmed the presence of moderate aortic stenosis and moderate to  severe mitral regurgitation.  The aortic valve was notably calcified  with decreased leaflet motion, and there was an oscillating density on  the aortic valve itself which appeared very friable and concerning for  possible embolization.  There was no sign of thrombus in the left atrial  appendage.  Subsequently, it was felt that she should undergo aortic  valve replacement, mitral valve repair, and maze procedure prior to her discharge to reduce her risk of recurrent stroke.  Dr. Darylene Price was  consulted for consideration of cardiac surgery.  He did agree that  aortic valve replacement and also mitral valve replacement was the best  treatment option.  Of note, it also appeared that she had a large  ascending thoracic aorta, so prior to scheduling he felt she should  obtain a CT scan of the aorta as well as a CT scan of the abdomen and  pelvis in light of her recent history of diverticular disease.  Although  she had a poor appetite, she was tolerating a regular consistency diet  and was transitioned to her home regimen of Actos.  At the time of this  dictation, her metformin has not been resumed as she has had sugars in  the 70s.  She is off Lantus insulin and is continued on NovoLog insulin  sliding scale as needed.  Her bowel function has been returning, but she  has had complaints of diarrhea so stool softener has been  placed on  hold, and a Clostridium difficile has been ordered if her diarrhea  persists.   Today, on exam, her heart has a regular rate and rhythm with a 2/6  systolic ejection murmur.  Her lungs show mild basilar crackles, but  otherwise they are clear and oxygen saturation greater than 92% on room  air.  Her abdominal exam is benign.  Her extremities are significant for  edema.  Her weight is now below her baseline weight.  Her renal function  remains stable with a creatinine most recently of 0.75.  The chest x-ray  on May 6th shows improve right basilar aeration with a small right  pneumothorax.  No change in left lobe effusion and basilar atelectasis.  Her incision appeared to be healing well without signs of infection.  Pain was controlled on oral medications.  She has been making progress  with mobility with cardiac rehabilitation.  The patient further remains  stable and we anticipate she will be ready for discharge home on May 7  or Jul 14, 2006.   LABORATORY DATA:  At the time of dictation, the labs showed a white  blood count decreasing now at 11.9 thousand, hemoglobin and hematocrit  stable at 9 and 26.8 respectively with a platelet count of 260, sodium  of 140, potassium 3.6 which was supplemented, chloride 96, CO2 of 32,  BUN of 25, creatinine 0.75, blood glucose 89.   DISCHARGE MEDICATIONS:  Metformin 500mg  p.o. daily, Actos 45 mg p.o.  daily, Micardis 20 mg p.o. daily, amiodarone 200 mg p.o. daily, Coumadin  daily with final dose to be determined based on her INR results prior to  the date of discharge, Ultram 50 mg 1-2 tablets p.o. every 6 hours  p.r.n. for pain.   DISCHARGE INSTRUCTIONS:  She is to avoid driving or heavy lifting more  than 6 pounds.  She is to continue daily walking at least twice daily.   FOLLOW UP:  1. She is to have PT/INR bloodwork at [facility] in about 72 hours      of her discharge.  She will be given detailed instructions on a       specific date prior to discharge. 2. She should call to set up a 2-week follow-up with Dr. [name] and to have a chest x-ray.  She should bring this to her      appointment with Dr. Roxy Manns.  3. She is to see Dr. Roxy Manns on August 22, 2006 at 2:15 p.m.  She will call      sooner if needed.  4. She needs to schedule a follow-up appointment with her gynecologist      to further evaluate a 6-cm left ovarian cyst.  5. She will need a chest CT scan in 6 months to follow up her less      than 1 cm right pulmonary nodule of undetermined significance.  Dr.      Roxy Manns can discuss this at her follow-up appointment.      Jacinta Shoe, P.A.      Valentina Gu. Roxy Manns, M.D.  Electronically Signed    AWZ/MEDQ  D:  07/12/2006  T:  07/13/2006  Job:  [id number]   cc:   Valentina Gu. Roxy Manns, M.D.  [name], MD, [facility]  Jama Flavors. Redmond Pulling, M.D.

## 2010-07-24 NOTE — Consult Note (Signed)
NAMEANALAYAH, LAMKINS                   ACCOUNT NO.:  [id number]   MEDICAL RECORD NO.:  [id number]          PATIENT TYPE:  INP   LOCATION:  [id number]                         FACILITY:  [facility]   PHYSICIAN:  Valentina Gu. Roxy Manns, M.D. DATE OF BIRTH:  October 27, 1940   DATE OF CONSULTATION:  07/01/2006  DATE OF DISCHARGE:                                 CONSULTATION   REQUESTING PHYSICIAN:  Denice Bors. Stanford Breed, MD, [facility].   PRIMARY CARE PHYSICIAN:  Jama Flavors. Redmond Pulling, M.D.   REASON FOR CONSULTATION:  Aortic stenosis, mitral regurgitation,  recurrent paroxysmal atrial fibrillation, recent stroke.   HISTORY OF PRESENT ILLNESS:  Ms. Fitts is a 70 year old, retired bookChief Operating Officer from [employer], followed by Dr. Kathryne Eriksson.  She has known she  has had a heart murmur for more than 15-20 years, and was told that she  had mild aortic stenosis in the past.  She denies any known history of  rheumatic fever or rheumatic heart disease.  She has otherwise had no  previous cardiac history until presently.  And, she specifically denies  any known history of heart attack, irregular heart rhythm, or hospital  admission for congestive heart failure.  The patient states that she was  in her usual state of health until approximately 1 month ago when she  developed an episode of diverticulitis.  She has had this once  previously in the distant past, but 1 month ago she developed left lower  abdominal pain associated with postprandial abdominal discomfort.  She  was treated as an outpatient with a 1-week course of oral antibiotics  (Ciprofloxacin and Flagyl).  The antibiotic gave her diarrhea but  ultimately all these symptoms resolved.  She denies any associated  fevers or chills.   Ms. Lenius was otherwise in her usual state of health, until April18,  2008, when she developed sudden onset of severe blurry vision which  persisted, prompting her to present.  She was diagnosed with acute  stroke based on MRI that revealed evidence  of a lesion in the mid brain  consistent with the patient's presenting symptoms of blurry vision and  diplopia.  Overnight, her symptoms resolved.  She had no other  neurologic findings whatsoever at the time of presentation, and she has  not had any transient or persistent neurologic deficit since then.  The  MRI also revealed two other small areas in the left hemisphere  suggestive of small embolic strokes in the recent past, although these  were presumably all completely asymptomatic.  She underwent a carotid  duplex scan and MR angiogram of the brain, all which failed to reveal  any significant extracranial or intracranial cerebrovascular disease.  She was noted to have intermittent episodes of paroxysmal atrial  fibrillation and atrial tachycardia.  Cardiology consultation was  obtained.   Ms. Bussie was initially evaluated by Dr. Stanford Breed on April19, 2008.  A 2-  D echocardiogram was performed confirming the presence of moderate  aortic stenosis with mean transvalvular gradient 38-mmHg, peak  transvalvular gradient 46-mmHg, and an estimated aortic valve area 0.99-  cm2.  The  peak velocity across the valve was 339-cm/sec.  The  echocardiogram was also notable for mild left ventricular dysfunction  with ejection fraction estimated at 50%, and severe mitral regurgitation  with a regurgitant volume estimated at 40 mL and regurgitant orifice  area estimated at 0.18-cm2.  The mitral valve appeared restricted and  there is no sign of mitral valve prolapse.   Ms. Reddic subsequently underwent left and right heart catheterization by  Dr. Burt Knack on [place], 2008.  She was found to have no significant  coronary artery disease.  She did have significant pulmonary  hypertension with a large V-wave on right heart cath consistent with  severe mitral regurgitation and moderate pulmonary hypertension.  PA  pressures measured 47/26, with a pulmonary capillary wedge pressure of  30, and a V-wave of 43.   The patient's resting cardiac output was 6.3  L/min corresponding to a cardiac index of 3.2.   This morning Ms. Tamburro underwent transesophageal echocardiogram by Dr.  Stanford Breed.  This confirmed the presence of moderate aortic stenosis and  moderate to severe mitral regurgitation.  The aortic valve was notably  calcified with decreased leaflet motion, and there was an oscillating  density on the aortic valve itself that appeared very friable and  concerning for the possibility of embolization.  There was no sign of  thrombus in the left atrial appendage.  Ejection fraction was estimated  at 45%.  There was moderate left atrial enlargement.  The ascending  aorta appeared mildly dilated.  Cardiac surgical consultation has now  been requested.   REVIEW OF SYSTEMS:  GENERAL:  The patient reports her appetite was  normal prior to this hospitalization once she got over her recent  episode of diverticulitis.  She does admit to progressive exertional  fatigue over the last year or so.  CARDIAC:  The patient denies any  chest pain, chest tightness, or chest pressure either with activity or  at rest.  The patient does admit to mild stable exertional shortness of  breath.  She denies resting shortness of breath, PND, orthopnea, or  lower extremity edema.  She has had intermittent tachy palpitations off  and on for 4-6 months.  She denies any syncopal episodes.  RESPIRATORY:  Negative.  The patient denies productive cough, hemoptysis, wheezing.  GASTROINTESTINAL:  Notable for recent bout of presumed diverticulitis.  The patient had associated left lower quadrant abdominal pain that was  worse after meals and somewhat crampy in nature.  The patient reports  she did not have any associated fevers or chills.  These symptoms  resolved with antibiotics, although she also got diarrhea that lasted  for 10 days.  She denies any blood in her stool.  She has chronic symptoms of GE reflux with sensation of  water brash in the back of her  throat.  She sleeps on a wedge and is careful not to eat after 6 p.m. at  night.  She has had a history of esophagitis in the past but not for  several years.  She has no difficulty swallowing.  She has a cough that  is worse in the morning when she gets up and has been attributed to  reflux.  MUSCULOSKELETAL:  Negative.  The patient denies arthritis or  arthralgias.  NEUROLOGIC:  Notable for the only history of severe blurry  vision and diplopia at the time of hospital admission.  This resolved  within 12 hours and the patient has had no other transient neurologic  deficits.  HEENT:  Negative.  The patient does report that she has one  tooth that is in need of a cap, but it is not painful or bothering her  at present.  She has been treated with oral antibiotics prior to all  routine dental exams up until recently, although her dentist reportedly  told her that this is no longer necessary.  PSYCHIATRIC:  Negative.  INFECTIOUS:  Notable only for the history of diverticulitis.  The  patient denies any fevers, chills, night sweats.  ENDOCRINE:  Notable  for type 2 diabetes mellitus.  The patient checks her blood sugars only  sporadically.   PAST MEDICAL HISTORY:  1. Aortic stenosis.  2. Type 2 diabetes mellitus.  3. GE reflux disease with hiatal hernia.  4. Esophagitis approximately 4 years ago, resolved on medical therapy  5. 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  6. Probable multiple recent small embolic strokes to the left      hemisphere without symptoms.  7. Diverticulitis, [id number], and previous episode 1991.  8. Remote history tobacco use.   FAMILY HISTORY:  Noncontributory.   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  She lives a fairly sedentary lifestyle.  She has a remote history of tobacco  use, but she quit smoking in 1985.  She denies excessive alcohol consumption.   MEDICATIONS PRIOR TO ADMISSION:  1. Metformin 2000 mg every evening.  2. Actos 45 mg daily.  3. Prevacid 30 mg daily.  4. Micardis 20 mg daily.   THE PATIENT DENIES ANY KNOWN DRUG ALLERGIES OR SENSITIVITIES.   CURRENT MEDICATIONS:  Include Actos, Protonix, Zocor, Coumadin,  Glucophage, Toprol XL, digoxin.   PHYSICAL EXAMINATION:  GENERAL:  The patient is a well-appearing, mildly  obese, female who appears her stated age in no acute distress.  HEENT:  Grossly unrevealing.  NECK:  Supple.  There is no cervical or supraclavicular lymphadenopathy.  There is no jugular venous distension.  No carotid bruits are noted.  CHEST:  Auscultation of the chest reveals clear breath sounds which are  symmetrical bilaterally.  No wheezes or rhonchi noted.  CARDIOVASCULAR:  Notable for regular rate and rhythm.  Telemetry rhythm strip demonstrates normal sinus rhythm.  There is a grade 2 to 3 over 6  systolic murmur heard along the sternal border with a more blowing harsh  systolic murmur that is holosystolic at the apex with radiation to the  axilla.  No diastolic murmurs are noted.  ABDOMEN:  Moderately obese but soft and nontender.  The liver edge is  not palpable.  Bowel sounds are present.  EXTREMITIES:  Warm and adequately perfused.  There is no lower extremity  edema.  Distal pulses are thready but palpable in the dorsalis pedis  position.  There is no sign of significant venous insufficiency.  There  is no lower extremity edema.  SKIN:  Clean, dry, healthy-appearing throughout.  RECTAL:  Deferred.  GU:  Deferred.  NEUROLOGIC:  Grossly nonfocal and symmetrical throughout.   LABORATORY DATA:  Blood work obtained today include hemoglobin 10.2,  hematocrit 30%, white blood count 8,300, platelet count 362,000.  Sodium  143, potassium 3.5, chloride 109, bicarb 27, BUN 6, creatinine 0.6.  Prothrombin time 26.4  seconds, INR 2.3 (on Coumadin).  Heparin level  0.37.   DIAGNOSTIC TESTS:  Cardiac catheterization performed by Dr. Burt Knack on  [place], 2008, is reviewed and notable for the absence of any  significant coronary artery disease.  There is 25% ostial stenosis of  the left anterior descending coronary artery.  There is no other  significant coronary artery disease and he has right dominant coronary  circulation.  A 2-D echocardiogram performed April21, 2008, is reviewed  as is transesophageal echocardiogram performed today.  These confirm the  presence of moderate to severe aortic stenosis.  The aortic valve is  somewhat calcified and the leaflets are relatively immobile.  However,  there is no aortic insufficiency.  The aortic annulus is relatively  small, but measures approximately 2-cm.  The aortic root is mildly  enlarged.  There is at least moderate (3+) to severe (4+) mitral  regurgitation.  This consists of a broad central jet of mitral  regurgitation.  There is no mitral valve prolapse.  There is incomplete  coaptation in the middle region, potentially due to annular dilatation  as the overall left ventricular chamber is enlarged.  The mitral valve  leaflets do not have any definitive abnormalities on them and there is  no clear sign of any leaflet perforation nor calcification nor  vegetation.  On the aortic valve there is a mobile density that appears  to be on the noncoronary cusp.  This could represent an old vegetation  versus somewhat mobile calcification.  This could well represent the  source of the patient's recent strokes.  There is no thrombus in the  left atrial appendage.  There is no patent foramen ovale or other  interatrial communication.  The left ventricle is dilated with moderate  reduction in left ventricular function, ejection fraction 45%.  There is  trace to mild tricuspid regurgitation.   IMPRESSION: 1. Moderate aortic stenosis with moderate to severe  mitral      regurgitation, stable mild exertional shortness of breath.  2. Moderate left ventricular dysfunction, EF 45-50% in the setting of      moderate-severe MR.  3. Recurrent paroxysmal atrial fibrillation and paroxysmal atrial      tachycardia, likely at least 4-5 months duration.  4. She also be presents with a minor embolic stroke that may well have      come from the somewhat mobile density noted on the aortic valve at      the time of transesophageal echocardiogram performed today.  MRI      also has evidence of two other small embolic strokes, likely fairly      recent.  She has no residual neurologic deficit.  There is no sign      of any hemorrhagic component nor mass effect on MRI.  5. Recent episode diverticulitis, symptoms resolved.   Options at this time include continued close observation for period of  several weeks prior to surgical intervention, versus proceeding with  surgery sooner rather than later.   Given the fact that she is already had at least two or three small  embolic events and there remains a somewhat mobile-appearing density on  the aortic valve at this time, I would favor proceeding with surgery  during this hospitalization.  We will plan aortic valve replacement with  mitral valve repair with concomitant maze procedure in an effort to  eliminate the patient's risk of recurrent atrial arrhythmias down the  road.  Based upon TEE findings I feel there is a fairly high likelihood  that mitral valve repair would be feasilbe, although replacement might  be neccessary depending upon operative findings.  PLAN:  I have discussed matters at length with Ms. Boffa and her husband  here in the hospital this afternoon.  Alternative strategies have been  discussed.  They understand and accept all associated risks of surgery  including, but not limited to, risk of death, stroke, myocardial  infarction, congestive heart failure, respiratory failure, pneumonia,   bleeding requiring blood transfusion, arrhythmia, heart block with  bradycardia requiring permanent pacemaker.  They also understand the  possibility that mitral valve repair would not be feasible and mitral  valve replacement would then be necessary.  We have also discussed the  relative risks and benefits of use of bioprosthetic tissue valve for  replacement of the aortic and/or mitral valve versus the use of  mechanical valve with need for long-term anticoagulation with Coumadin.  We have discussed this in detail and at this point Ms. Dlugosz is leaning  towards use of a bioprosthetic tissue valve in an effort to potentially  eliminate need for long-term anticoagulation and associated risk of  thromboembolism.  We will continue to discuss this prior to making  definitive plans for surgery.  She understands that with a bioprosthetic  tissue valve there would be some finite risk of late structural valve deterioration and failure, depending upon her longevity.  All of her  questions have been addressed.   I agree with plans to continue heparin and to allow Coumadin to wear  off.  We will obtain a CT scan of the aorta to evaluate the somewhat enlarged  ascending thoracic aorta.  We will also obtain CT scan of the abdomen and pelvis to recent history  of diverticular disease.   We will tentatively plan for surgery on Thursday, May1, 2008.  If she  continues to do well and her rhythm remains stable, we could consider  managing her as an outpatient on Lovenox between now and the time of  surgery.  However, I would agree with initiation of amiodarone therapy  prior to surgery, and her rhythm should be monitored for at least 24-48  hours in the short term.      Valentina Gu. Roxy Manns, M.D.  Electronically Signed     CHO/MEDQ  D:  07/01/2006  T:  07/01/2006  Job:  [id number]   cc:   Denice Bors. Stanford Breed, MD, [facility]  Alyson Locket. Love, M.D.  Jama Flavors. Redmond Pulling, M.D.

## 2010-07-24 NOTE — Consult Note (Signed)
NAMEAUTUM, STETLER NO.:  [id number]   MEDICAL RECORD NO.:  [id number]          PATIENT TYPE:  INP   LOCATION:  3020                         FACILITY:  [place]   PHYSICIAN:  Denice Bors. Stanford Breed, MD, FACCDATE OF BIRTH:  [birth year]   DATE OF CONSULTATION:  06/25/2006  DATE OF DISCHARGE:                                 CONSULTATION   HISTORY:  [name] is a pleasant 70 year old female with past medical  history of diabetes mellitus, diverticulitis and now status post  presumed embolic CVA, who we are asked to evaluate for murmur/aortic  stenosis.  The patient states that [name] has had a murmur diagnosed since  the 1980s.  [name] had an echocardiogram performed at the [facility] office in  the early 90s by report, but I do not have those records available.  [name]  has not had any cardiac follow-up since then.  Note, [name] denies any  dyspnea on exertion, orthopnea, PND, pedal edema, presyncope, syncope or  exertional chest pain.  [name] occasionally feels [name] heart skip.  [name]  arose from bed yesterday and developed double vision.  There was no  weakness or loss of strength in [name] extremities.  [name] was ultimately  admitted by the neurology service and diagnosed with an embolic CVA.   Bilateral carotid Dopplers show no significant internal carotid artery  stenosis bilaterally, based on the preliminary report.  An MRI revealed  a right infarct in the mid brain area, with 2 older left hemispheric  infarcts.  The MRA showed no significant obstruction.   [name] is being treated medically for CVA.  [name] symptoms have resolved.  [name] has been noted to have a murmur on exam, and we were asked to  further evaluate.   PRESENT MEDICATIONS:  1. Metformin 2000 mg p.o. q.a.c.  2. Avapro 75 mg daily.  3. Actos 45 mg daily.  4. Protonix 40 mg daily.  5. Aspirin 325 mg daily.  6. Enoxaparin 40 mg subcutaneously q. 24.   ALLERGIES:  NO KNOWN DRUG ALLERGIES.   SOCIAL HISTORY:  [name] has remote  history of tobacco use, but has not  smoked since 1985.  [name] rarely consumes alcohol.   FAMILY HISTORY:  Negative for coronary artery disease.   PAST MEDICAL HISTORY:  Significant for diabetes mellitus.  [name] denies  any hypertension or hyperlipidemia.  [name] has a history of diverticulitis  as well as a hiatal hernia.  [name] has had prior neuroma removed from [name]  foot.  There are no other surgeries noted.   REVIEW OF SYSTEMS:  [name] denies any headaches or fevers or chills.  There  was no productive cough or hemoptysis.  There was no dysphagia,  odynophagia, melena or hematochezia.  [name] has recently been treated for  a bout of diverticulitis.  There was no dysuria or hematuria.  There was  no rash or seizure activity.  There was no orthopnea, PND or pedal  edema.  The remaining systems are negative.   PHYSICAL EXAMINATION:  VITAL SIGNS:  Blood pressure 126/79, pulse 80,  afebrile.  GENERAL:  [name] is well-developed and somewhat obese.  [name] was in no acute  distress.  SKIN:  Warm and dry.  [name] does not appear depressed and there was no peripheral clubbing.  [name] back is normal.  HEENT:  Normal with normal eyelids.  NECK:  Supple with a mildly diminished carotid upstroke bilaterally.  There are bruits bilaterally radiating from [name] heart.  There was no  thyromegaly noted.  I cannot appreciate jugular venous distension.  CHEST:  Clear to auscultation and normal expansion.  CARDIOVASCULAR:  Regular rhythm.  Normal S1 and S2.  There was a 3/6  systolic murmur at the left sternal border, that radiates to the  carotids.  S2 was not diminished.  There was no S3 or S4.  I could  cannot palpate [name] PMI.  ABDOMEN:  Nontender.  Positive bowel sounds.  No hepatosplenomegaly.  No  mass appreciated.  There is no abdominal bruit.  [name] had 2+ femoral  pulses bilaterally.  No bruits.  EXTREMITIES:  Show no edema that I could palpate.  No cords.  [name] has 2+  posterior tibial pulses bilaterally.   NEUROLOGIC:  Exam was grossly intact.   DIAGNOSTIC TESTING:  Shows sinus rhythm at a rate of 84.  The axis is  normal.  There were no significant ST changes noted.  MRI/MRA as outlined above.   LABORATORY STUDIES:  White blood cell count is 11, hemoglobin 12.7,  hematocrit of 37.4, platelet count 466.  BUN 9, creatinine 0.53,  potassium 4.  Troponin I mildly elevated at 0.26, with normal CK-MB.  Liver functions are normal.  LDL 98.   DIAGNOSES:  1. Murmur on exam, consistent with aortic stenosis -- we will plan to      proceed with an echocardiogram to assess [name] LV function and also      the severity of [name] aortic stenosis.  It sounds to be mild to      moderate on examination.  [name] continued follow-up of this      in the future.  However, [name] does not have any evidence of dyspnea,      chest pain or syncope on examination.  I have explained that this      will most likely require surgical correction in the future, given      [name]. However, we would Hamilton to follow [name] for these      symptoms before proceeding.  Note, [name] has had a murmur since [name]      early 35s.  It is therefore possible that [name] has a bicuspid aortic      valve, but the transthoracic echocardiogram will help elucidate      this.  2. Mildly increased troponin -- The etiology of this is unclear.  [name]      has had no chest pain and [name] electrocardiogram was normal.  We      will recycle these enzymes.  If there is no clear trend up, then we      will plan an outpatient Myoview for further evaluation.  3. Recent embolic CVA.  I agree with aspirin.  If [name] is felt to Hamilton      a transesophageal echocardiogram to rule out embolic source for [name]      CVA, then we will be happy to arrange this; but, I will leave this      to Dr. Erling Cruz..  4. Diabetes mellitus.  This is a coronary artery disease equivalent  and I think [name] would benefit from a statin long term.  We will     begin Zocor 40 mg p.o.  q.h.s. and [name] lipids and liver      checked in 6 weeks.  We will be happy to follow while [name] is the      hospital.      Denice Bors. Stanford Breed, MD, [facility]  Electronically Signed     BSC/MEDQ  D:  06/25/2006  T:  06/26/2006  Job:  [id number]

## 2010-07-24 NOTE — Cardiovascular Report (Signed)
[name], [name] NO.:  [id number]   MEDICAL RECORD NO.:  [id number]          PATIENT TYPE:  INP   LOCATION:  [id number]                         FACILITY:  [facility]   PHYSICIAN:  Juanda Bond. Burt Knack, MD  DATE OF BIRTH:  [birth year]   DATE OF PROCEDURE:  06/27/2006  DATE OF DISCHARGE:                            CARDIAC CATHETERIZATION   PROCEDURE:  Right heart catheterization, coronary angiography and Angio-  Seal of the right femoral artery.   INDICATIONS:  [name] is a 70 year old woman who presented with a  posterior circulation stroke.  During her hospitalization, she has had  atrial dysrhythmias, chest pressure as well as a minor rise in her  cardiac troponins.  In that setting, she was referred for left and right  cardiac catheterization.  Right heart catheterization was performed  because of valvular heart disease as she has been shown to have aortic  stenosis and mitral regurgitation.   Risks and indications of procedure were explained to the patient.  Informed consent was obtained.  The right groin was prepped, draped and  anesthetized with 1% lidocaine.  Using modified Seldinger technique, a 6-  French sheath was placed in the right femoral artery and a 7-French  sheath was placed in the right femoral vein.  Coronary angiography was  performed first and standard Judkins preformed catheters were used.  For  the left coronary artery, I had to switch to a JL-3.5 cm catheter.  All  catheter exchanges were performed over a guidewire.  Following selective  coronary angiography, the right heart catheterization was performed.  I  did not cross the aortic valve in the setting of the patient's recent  stroke.  The right heart catheterization was performed with an end-hole  multipurpose catheter.  Oxygen saturations were drawn from the superior  vena cava, inferior vena cava, pulmonary artery and femoral artery.  Pressures were recorded from the right atrium through the  pulmonary  capillary wedge position.   At the conclusion of the case, the right femoral artery was sealed with  a 6-French Angio-Seal device and the right femoral venous sheath was  pulled and manual pressure used for hemostasis.  There were no  complications from the procedure.   FINDINGS:  1. Right atrial pressure:  A-wave 14, V-wave 13, mean of 11.  2. Right ventricular pressures:  47/8 with an end-diastolic pressure      of 12.  3. Pulmonary artery pressure:  47/26 with mean of 38.  4. Pulmonary capillary wedge pressure:  A-wave 27, V-wave 43, mean of      30, aortic pressure 124/74 with a mean of 96.  5. Oxygen saturation:  Superior vena cava 69, inferior vena cava 71,      PA 66, femoral artery 94.  6. Cardiac output by the Fick method is 6.3 liters per minute.      Cardiac index is 3.23 liters per minute per meter squared.  7. Coronary angiography:  The left mainstem is large-caliber and is      angiographically normal.  It trifurcates into the LAD, intermediate  branch and left circumflex.  8. The LAD is a large-caliber vessel that courses down and wraps      around the LV apex and gives off a large first diagonal branch.      There is a smooth nonobstructive appearing lesion in the ostium of      the LAD that appears to be approximately 25% stenosed by      angiography.  It is foreshortened in one-view and appears slightly      tighter in that view, but I think the other view show that there is      very little disease in that segment of the LAD.  9. Ramus intermedius is medium caliber and is angiographically normal.  10.The left circumflex is a large-caliber vessel.  It gives off a      medium sized OM branch and a large posterolateral branch.  The AV      groove circumflex beyond the marginal branch is fairly small.      There is no significant angiographic disease in the left circumflex      system.  11.The right coronary artery is a very large vessel.  It courses  down      and gives off a large PDA branch as well as a large second      posterolateral branch.  The first posterolateral branch is small.      There is an RV marginal branch in the midportion of the right      coronary artery.  The right coronary artery is angiographically      normal.   ASSESSMENT:  1. Nonobstructive coronary artery disease with a smooth nonobstructive      appearing lesion in the ostium of the left anterior descending      coronary artery.  2. Moderate pulmonary hypertension albeit with a low transpulmonic      gradient of 8 mmHg which signifies that this is likely secondary to      left heart disease with what I would suspect to be prominent      component of mitral regurgitation.  3. Large pulmonary capillary wedge pressure V-wave consistent with      significant mitral regurgitation.   PLAN:  [name] will continue with medical therapy for her valvular  disease and recent stroke.  Will resume heparin 4 hours after the sheath  is pulled and start Coumadin tonight.  Further evaluation for her  valvular heart disease per Dr. Stanford Breed.      Juanda Bond. Burt Knack, MD  Electronically Signed     MDC/MEDQ  D:  06/27/2006  T:  06/28/2006  Job:  [id number]   cc:   Denice Bors. Stanford Breed, MD, [facility]

## 2010-07-24 NOTE — H&P (Signed)
NAMESERIYA, HARKNESS NO.:  [id number]   MEDICAL RECORD NO.:  [id number]          PATIENT TYPE:  INP   LOCATION:  3020                         FACILITY:  [facility]   PHYSICIAN:  Alyson Locket. Love, M.D.    DATE OF BIRTH:  1941-02-18   DATE OF ADMISSION:  06/24/2006  DATE OF DISCHARGE:                              HISTORY & PHYSICAL   REASON FOR ADMISSION:  This is the first [facility] admission  for this 70 year old right-handed white married female from [place],  [place] admitted from the emergency room for evaluation of  blurred vision.   HISTORY OF PRESENT ILLNESS:  Ms. Boehnlein has a 5-year history of diabetes  mellitus and recently has had symptoms related to diverticulosis  requiring Cipro and Flagyl therapy.  She did not feel well on these  medications and they were discontinued.  She has also had a recent  change in her diabetic medicine from Avandia to Actos.  This occurred 1  month ago.  She went to bed feeling well on June 23, 2006, and awoke  6:15 a.m. in the morning with double vision and blurred vision relieved  by closing one eye, worse looking to the left.  There was no associated  headache, syncope, seizure, chest pain, nausea and vomiting, or vertigo.  She did feel lightheaded and noted a staggering gait when she got up to  walk.  She was seen at the emergency room about 9:00 a.m. and a CT scan  of the brain was obtained which was within normal limits.  There was no  change in her examination or findings throughout the day.   PAST MEDICAL HISTORY:  Her past medical history is significant for  diabetes mellitus in 2003, a heart murmur thought to represent aortic  stenosis, gastroesophageal reflux disease, diverticulosis, and a past  history of vertigo.   MEDICATIONS:  Her medicines are metformin 500 mg four at night, Actos 45  mg daily, Prevacid 30 mg daily, Micardis 20 mg daily.  She occasionally  takes aspirin, the last time was 4  days ago.   SOCIAL HISTORY:  She is married, has no children.  She does not drink  alcohol.  She does not smoke cigarettes.   FAMILY HISTORY:  Her mother died at age 24 from a stroke.  Her father  died at age 8, had chronic obstructive pulmonary disease and colon  disease.   EXAMINATION:  A well-developed white female.  Blood pressure right and  left arm 140/80, heart rate was 64 and regular.  There were right  greater than left carotid and supraclavicular heart sounds radiating.   Mental status: She was alert, oriented x3, followed one-, two- and three-  step commands.  Cranial nerve examination revealed visual fields full,  disks flat, spontaneous venous pulsations seen, extraocular movements  full with nystagmus.  She had upbeat nystagmus and counterclockwise  nystagmus and some decreased lateral movement in her left eye.  Corneals  were present.  Slight left ptosis.  Tongue midline.  Uvula midline.  Gags present.  Hearing intact.  Air conduction greater than bone  conduction.  Motor examination: 5/5 strength proximally and distally in  the upper and lower extremities.  No evidence of drift.  Finger-to-nose  is possibly worse in the left hand and arm than the right.  Fair heel-to-  shin bilaterally.  Sensory examination intact.  Deep tendon reflexes 2+.  Plantar responses downgoing.  On standing, she was able to stand on her  toes.  She was not able to stand on her heels.  Romberg testing was  negative at this time though earlier in the day it had been positive.   Her general examination revealed tympanic membranes clear.  Mouth in  good repair.  Lungs were clear.  Examination of the heart revealed a  heart murmur, bowel sounds were normal, there was no enlargement of the  liver, spleen or kidneys.   LABORATORY DATA:  Sodium is 139, potassium 4.2, chloride 108, BUN 9,  glucose 151, hemoglobin 13.2.  PT 12.9, INR was 0.9.  Her CT scan of  brain was normal.   IMPRESSION:  1.  Blurred vision (code 368.2) with history suggestive of double      vision, (code 368.40) rule out brain stem stroke (code 434.01).  2. Diabetes mellitus (code 250.60).  3. Gastroesophageal reflux disease.  4. Aortic stenosis (code 424.1).  5. Diverticulosis.  6. Past history of vertigo.   PLAN:  Plan at this time is admit the patient, place her on aspirin  after swallowing study or rectal aspirin if swallowing study cannot be  performed.  MRI and MRA will be obtained for further evaluation.           ______________________________  Alyson Locket. Erling Cruz, M.D.     JML/MEDQ  D:  06/24/2006  T:  06/25/2006  Job:  [id number]

## 2010-08-04 ENCOUNTER — Ambulatory Visit (INDEPENDENT_AMBULATORY_CARE_PROVIDER_SITE_OTHER): Payer: Medicare Other | Admitting: *Deleted

## 2010-08-04 ENCOUNTER — Ambulatory Visit (HOSPITAL_COMMUNITY): Payer: Medicare Other | Attending: Cardiology | Admitting: Radiology

## 2010-08-04 DIAGNOSIS — I359 Nonrheumatic aortic valve disorder, unspecified: Secondary | ICD-10-CM

## 2010-08-04 DIAGNOSIS — I079 Rheumatic tricuspid valve disease, unspecified: Secondary | ICD-10-CM | POA: Insufficient documentation

## 2010-08-04 DIAGNOSIS — I350 Nonrheumatic aortic (valve) stenosis: Secondary | ICD-10-CM

## 2010-08-04 DIAGNOSIS — I080 Rheumatic disorders of both mitral and aortic valves: Secondary | ICD-10-CM | POA: Insufficient documentation

## 2010-08-04 DIAGNOSIS — I059 Rheumatic mitral valve disease, unspecified: Secondary | ICD-10-CM

## 2010-08-24 ENCOUNTER — Other Ambulatory Visit: Payer: Self-pay | Admitting: Cardiology

## 2010-08-31 ENCOUNTER — Ambulatory Visit (INDEPENDENT_AMBULATORY_CARE_PROVIDER_SITE_OTHER): Payer: Medicare Other | Admitting: *Deleted

## 2010-08-31 DIAGNOSIS — I059 Rheumatic mitral valve disease, unspecified: Secondary | ICD-10-CM

## 2010-08-31 DIAGNOSIS — I359 Nonrheumatic aortic valve disorder, unspecified: Secondary | ICD-10-CM

## 2010-08-31 LAB — POCT INR: INR: 3.2

## 2010-09-28 ENCOUNTER — Ambulatory Visit (INDEPENDENT_AMBULATORY_CARE_PROVIDER_SITE_OTHER): Payer: Medicare Other | Admitting: *Deleted

## 2010-09-28 DIAGNOSIS — I059 Rheumatic mitral valve disease, unspecified: Secondary | ICD-10-CM

## 2010-09-28 DIAGNOSIS — I359 Nonrheumatic aortic valve disorder, unspecified: Secondary | ICD-10-CM

## 2010-09-29 ENCOUNTER — Other Ambulatory Visit: Payer: Self-pay | Admitting: Internal Medicine

## 2010-10-05 ENCOUNTER — Other Ambulatory Visit: Payer: Self-pay | Admitting: Cardiology

## 2010-10-23 ENCOUNTER — Telehealth: Payer: Self-pay | Admitting: *Deleted

## 2010-10-23 NOTE — Telephone Encounter (Signed)
Pt's last lipid panel was 09/2009. She is req to know when she is due for this again?

## 2010-10-26 ENCOUNTER — Ambulatory Visit (INDEPENDENT_AMBULATORY_CARE_PROVIDER_SITE_OTHER): Payer: Medicare Other | Admitting: *Deleted

## 2010-10-26 DIAGNOSIS — I059 Rheumatic mitral valve disease, unspecified: Secondary | ICD-10-CM

## 2010-10-26 DIAGNOSIS — I359 Nonrheumatic aortic valve disorder, unspecified: Secondary | ICD-10-CM

## 2010-10-26 LAB — POCT INR: INR: 3.4

## 2010-10-26 NOTE — Telephone Encounter (Signed)
In the next 1-2 months

## 2010-10-27 NOTE — Telephone Encounter (Signed)
Is this labs only OR OV also?

## 2010-10-28 ENCOUNTER — Ambulatory Visit (INDEPENDENT_AMBULATORY_CARE_PROVIDER_SITE_OTHER): Payer: Medicare Other | Admitting: Internal Medicine

## 2010-10-28 ENCOUNTER — Other Ambulatory Visit (INDEPENDENT_AMBULATORY_CARE_PROVIDER_SITE_OTHER): Payer: Medicare Other

## 2010-10-28 ENCOUNTER — Encounter: Payer: Self-pay | Admitting: Internal Medicine

## 2010-10-28 DIAGNOSIS — E538 Deficiency of other specified B group vitamins: Secondary | ICD-10-CM

## 2010-10-28 DIAGNOSIS — E119 Type 2 diabetes mellitus without complications: Secondary | ICD-10-CM

## 2010-10-28 DIAGNOSIS — E559 Vitamin D deficiency, unspecified: Secondary | ICD-10-CM

## 2010-10-28 DIAGNOSIS — I10 Essential (primary) hypertension: Secondary | ICD-10-CM

## 2010-10-28 DIAGNOSIS — D509 Iron deficiency anemia, unspecified: Secondary | ICD-10-CM

## 2010-10-28 DIAGNOSIS — E785 Hyperlipidemia, unspecified: Secondary | ICD-10-CM

## 2010-10-28 DIAGNOSIS — Z79899 Other long term (current) drug therapy: Secondary | ICD-10-CM

## 2010-10-28 LAB — COMPREHENSIVE METABOLIC PANEL
Albumin: 4.1 g/dL (ref 3.5–5.2)
Alkaline Phosphatase: 89 U/L (ref 39–117)
BUN: 14 mg/dL (ref 6–23)
Glucose, Bld: 121 mg/dL — ABNORMAL HIGH (ref 70–99)
Potassium: 4.5 mEq/L (ref 3.5–5.1)
Total Bilirubin: 0.6 mg/dL (ref 0.3–1.2)

## 2010-10-28 LAB — CBC WITH DIFFERENTIAL/PLATELET
Basophils Relative: 0.5 % (ref 0.0–3.0)
Eosinophils Relative: 4 % (ref 0.0–5.0)
Lymphocytes Relative: 12.4 % (ref 12.0–46.0)
Monocytes Relative: 7.1 % (ref 3.0–12.0)
Neutrophils Relative %: 76 % (ref 43.0–77.0)
RBC: 4.12 Mil/uL (ref 3.87–5.11)
WBC: 9 10*3/uL (ref 4.5–10.5)

## 2010-10-28 LAB — HEMOGLOBIN A1C: Hgb A1c MFr Bld: 6.1 % (ref 4.6–6.5)

## 2010-10-28 LAB — IBC PANEL: Iron: 44 ug/dL (ref 42–145)

## 2010-10-28 NOTE — Assessment & Plan Note (Signed)
I will check her level today

## 2010-10-28 NOTE — Assessment & Plan Note (Signed)
Check CBC and iron level

## 2010-10-28 NOTE — Patient Instructions (Signed)
Diabetes, Type 2 Diabetes is a lasting (chronic) disease. In type 2 diabetes, the pancreas does not make enough insulin (a hormone), and the body does not respond normally to the insulin that is made. This type of diabetes was also previously called adult onset diabetes. About 90% of all those who have diabetes have type 2. It usually occurs after the age of 40 but can occur at any age. CAUSES Unlike type 1 diabetes, which happens because insulin is no longer being made, type 2 diabetes happens because the body is making less insulin and has trouble using the insulin properly. SYMPTOMS  Drinking more than usual.   Urinating more than usual.   Blurred vision.   Dry, itchy skin.   Frequent infection like yeast infections in women.   More tired than usual (fatigue).  TREATMENT  Healthy eating.   Exercise.   Medication, if needed.   Monitoring blood glucose (sugar).   Seeing your caregiver regularly.  HOME CARE INSTRUCTIONS  Check your blood glucose (sugar) at least once daily. More frequent monitoring may be necessary, depending on your medications and on how well your diabetes is controlled. Your caregiver will advise you.   Take your medicine as directed by your caregiver.   Do not smoke.   Make wise food choices. Ask your caregiver for information. Weight loss can improve your diabetes.   Learn about low blood glucose (hypoglycemia) and how to treat it.   Get your eyes checked regularly.   Have a yearly physical exam. Have your blood pressure checked. Get your blood and urine tested.   Wear a pendant or bracelet saying that you have diabetes.   Check your feet every night for sores. Let your caregiver know if you have sores that are not healing.  SEEK MEDICAL CARE IF:  You are having problems keeping your blood glucose at target range.   You feel you might be having problems with your medicines.   You have symptoms of an illness that is not improving after 24  hours.   You have a sore or wound that is not healing.   You notice a change in vision or a new problem with your vision.   You develop a fever of more than 100.5.  Document Released: 02/22/2005 Document Re-Released: 03/16/2009 Exit[employer] Patient Information 2011 [employer]. 

## 2010-10-28 NOTE — Assessment & Plan Note (Signed)
Check her A1C and monitor her renal function

## 2010-10-28 NOTE — Progress Notes (Signed)
Subjective:    Patient ID: [name], female    DOB: 09/23/1940, 70 y.o.   MRN: [id number]  Diabetes She presents for her follow-up diabetic visit. She has type 2 diabetes mellitus. Her disease course has been improving. There are no hypoglycemic associated symptoms. Pertinent negatives for hypoglycemia include no dizziness, headaches, pallor, seizures, speech difficulty or tremors. Pertinent negatives for diabetes include no blurred vision, no chest pain, no fatigue, no foot paresthesias, no foot ulcerations, no polydipsia, no polyphagia, no polyuria, no visual change, no weakness and no weight loss. There are no hypoglycemic complications. Symptoms are stable. There are no diabetic complications. Current diabetic treatment includes oral agent (monotherapy). She is compliant with treatment all of the time. Her weight is stable. She is following a generally healthy diet. Meal planning includes avoidance of concentrated sweets. She has had a previous visit with a dietician. She participates in exercise intermittently. There is no change in her home blood glucose trend. Her breakfast blood glucose range is generally 110-130 mg/dl. Her lunch blood glucose range is generally 110-130 mg/dl. Her dinner blood glucose range is generally 110-130 mg/dl. Her highest blood glucose is 110-130 mg/dl. Her overall blood glucose range is 110-130 mg/dl. An ACE inhibitor/angiotensin II receptor blocker is being taken. She does not see a podiatrist.Eye exam is current.      Review of Systems  Constitutional: Negative for fever, chills, weight loss, diaphoresis, activity change, appetite change, fatigue and unexpected weight change.  HENT: Negative for sore throat, trouble swallowing, neck pain, neck stiffness and voice change.   Eyes: Negative for blurred vision, photophobia, redness and visual disturbance.  Respiratory: Negative for apnea, cough, choking, chest tightness, shortness of breath, wheezing and stridor.     Cardiovascular: Negative for chest pain, palpitations and leg swelling.  Gastrointestinal: Negative for nausea, vomiting, abdominal pain, diarrhea, constipation, blood in stool, abdominal distention, anal bleeding and rectal pain.  Genitourinary: Negative for dysuria, urgency, polyuria, frequency, hematuria, flank pain, decreased urine volume, enuresis, difficulty urinating and dyspareunia.  Musculoskeletal: Negative for myalgias, back pain, joint swelling, arthralgias and gait problem.  Skin: Negative for color change, pallor, rash and wound.  Neurological: Negative for dizziness, tremors, seizures, syncope, facial asymmetry, speech difficulty, weakness, light-headedness, numbness and headaches.  Hematological: Negative for polydipsia, polyphagia and adenopathy. Does not bruise/bleed easily.  Psychiatric/Behavioral: Negative.        Objective:   Physical Exam  Vitals reviewed. Constitutional: She is oriented to person, place, and time. She appears well-developed and well-nourished. No distress.  HENT:  Head: Normocephalic and atraumatic.  Right Ear: External ear normal.  Left Ear: External ear normal.  Nose: Nose normal.  Mouth/Throat: Oropharynx is clear and moist. No oropharyngeal exudate.  Eyes: Conjunctivae and EOM are normal. Pupils are equal, round, and reactive to light. Right eye exhibits no discharge. Left eye exhibits no discharge. No scleral icterus.  Neck: Normal range of motion. Neck supple. No JVD present. No tracheal deviation present. No thyromegaly present.  Cardiovascular: Normal rate, regular rhythm and intact distal pulses.  Exam reveals no gallop and no friction rub.   Murmur heard. Pulmonary/Chest: Effort normal and breath sounds normal. No stridor. No respiratory distress. She has no wheezes. She has no rales. She exhibits no tenderness.  Abdominal: Soft. Bowel sounds are normal. She exhibits no distension and no mass. There is no tenderness. There is no rebound  and no guarding.  Musculoskeletal: Normal range of motion. She exhibits no edema and no tenderness.  Lymphadenopathy:  She has no cervical adenopathy.  Neurological: She is alert and oriented to person, place, and time. She has normal reflexes. She displays normal reflexes. No cranial nerve deficit. She exhibits normal muscle tone. Coordination normal.  Skin: Skin is warm and dry. No rash noted. She is not diaphoretic. No erythema. No pallor.  Psychiatric: She has a normal mood and affect. Her behavior is normal. Judgment and thought content normal.     Lab Results  Component Value Date   WBC 7.1 01/26/2010   HGB 11.4* 01/26/2010   HCT 33.5* 01/26/2010   PLT 355.0 01/26/2010   CHOL 197 09/24/2009   TRIG 116.0 09/24/2009   HDL 57.70 09/24/2009   LDLDIRECT 132.3 10/17/2008   ALT 12 01/26/2010   AST 20 01/26/2010   NA 139 06/02/2010   K 4.9 06/02/2010   CL 105 06/02/2010   CREATININE 0.8 06/02/2010   BUN 18 06/02/2010   CO2 28 06/02/2010   TSH 1.24 01/26/2010   INR 3.4 10/26/2010   HGBA1C 5.9 06/02/2010   MICROALBUR 1.1 10/17/2008       Assessment & Plan:

## 2010-10-28 NOTE — Assessment & Plan Note (Signed)
DESPITE HER RISK FACTORS SHE REFUSES TO TAKE A STATIN

## 2010-10-28 NOTE — Assessment & Plan Note (Signed)
BP is well controlled 

## 2010-10-28 NOTE — Telephone Encounter (Signed)
Labs prior? Repeat of what was ordered at last ov?

## 2010-10-28 NOTE — Telephone Encounter (Signed)
Labs + OV

## 2010-10-29 LAB — TSH: TSH: 1.42 u[IU]/mL (ref 0.35–5.50)

## 2010-10-29 LAB — VITAMIN B12: Vitamin B-12: 778 pg/mL (ref 211–911)

## 2010-10-30 ENCOUNTER — Encounter: Payer: Self-pay | Admitting: Internal Medicine

## 2010-10-30 LAB — VITAMIN D 1,25 DIHYDROXY
Vitamin D2 1, 25 (OH)2: 68 pg/mL
Vitamin D3 1, 25 (OH)2: <8 pg/mL

## 2010-11-02 ENCOUNTER — Other Ambulatory Visit: Payer: Self-pay | Admitting: Cardiology

## 2010-11-02 MED ORDER — TELMISARTAN 20 MG PO TABS: ORAL_TABLET | ORAL | Status: DC

## 2010-11-02 NOTE — Telephone Encounter (Signed)
Pt calling to check on status of micardis refill. Please call RX refill in.

## 2010-11-03 ENCOUNTER — Other Ambulatory Visit: Payer: Self-pay | Admitting: *Deleted

## 2010-11-03 MED ORDER — TELMISARTAN 20 MG PO TABS: ORAL_TABLET | ORAL | Status: DC

## 2010-11-16 ENCOUNTER — Ambulatory Visit (INDEPENDENT_AMBULATORY_CARE_PROVIDER_SITE_OTHER): Payer: Medicare Other | Admitting: *Deleted

## 2010-11-16 DIAGNOSIS — I059 Rheumatic mitral valve disease, unspecified: Secondary | ICD-10-CM

## 2010-11-16 DIAGNOSIS — I359 Nonrheumatic aortic valve disorder, unspecified: Secondary | ICD-10-CM

## 2010-11-16 LAB — POCT INR: INR: 3

## 2010-11-25 ENCOUNTER — Telehealth: Payer: Self-pay | Admitting: Cardiology

## 2010-11-25 NOTE — Telephone Encounter (Signed)
flecenide100 mg, [facility] [address] 90 days supply, pt said pharmacy faxed Monday, pt now out and needs asap

## 2010-11-27 ENCOUNTER — Telehealth: Payer: Self-pay | Admitting: Cardiology

## 2010-11-27 MED ORDER — FLECAINIDE ACETATE 100 MG PO TABS: 100.0000 mg | ORAL_TABLET | Freq: Two times a day (BID) | ORAL | Status: DC

## 2010-11-27 NOTE — Telephone Encounter (Signed)
Pt need refill flecenide  [facility] [address] she is out of pills 2nd request

## 2010-11-27 NOTE — Telephone Encounter (Signed)
REFILL SENT VIA EPIC./CY

## 2010-12-07 ENCOUNTER — Other Ambulatory Visit: Payer: Self-pay | Admitting: Cardiology

## 2010-12-07 MED ORDER — WARFARIN SODIUM 4 MG PO TABS: ORAL_TABLET | ORAL | Status: DC

## 2010-12-08 ENCOUNTER — Telehealth: Payer: Self-pay | Admitting: Cardiology

## 2010-12-08 MED ORDER — METOPROLOL SUCCINATE ER 50 MG PO TB24: 50.0000 mg | ORAL_TABLET | Freq: Every day | ORAL | Status: DC

## 2010-12-08 MED ORDER — WARFARIN SODIUM 4 MG PO TABS: ORAL_TABLET | ORAL | Status: DC

## 2010-12-08 MED ORDER — FLECAINIDE ACETATE 100 MG PO TABS: 100.0000 mg | ORAL_TABLET | Freq: Two times a day (BID) | ORAL | Status: DC

## 2010-12-08 NOTE — Telephone Encounter (Signed)
Pt wants talk you about her rx's She said she is having some issues with them

## 2010-12-14 ENCOUNTER — Ambulatory Visit (INDEPENDENT_AMBULATORY_CARE_PROVIDER_SITE_OTHER): Payer: Medicare Other | Admitting: *Deleted

## 2010-12-14 DIAGNOSIS — I359 Nonrheumatic aortic valve disorder, unspecified: Secondary | ICD-10-CM

## 2010-12-14 DIAGNOSIS — I059 Rheumatic mitral valve disease, unspecified: Secondary | ICD-10-CM

## 2010-12-17 DIAGNOSIS — N83202 Unspecified ovarian cyst, left side: Secondary | ICD-10-CM | POA: Insufficient documentation

## 2010-12-17 DIAGNOSIS — I10 Essential (primary) hypertension: Secondary | ICD-10-CM | POA: Insufficient documentation

## 2010-12-17 DIAGNOSIS — E1165 Type 2 diabetes mellitus with hyperglycemia: Secondary | ICD-10-CM | POA: Insufficient documentation

## 2010-12-17 DIAGNOSIS — K219 Gastro-esophageal reflux disease without esophagitis: Secondary | ICD-10-CM | POA: Insufficient documentation

## 2010-12-17 DIAGNOSIS — D071 Carcinoma in situ of vulva: Secondary | ICD-10-CM | POA: Insufficient documentation

## 2010-12-17 DIAGNOSIS — D219 Benign neoplasm of connective and other soft tissue, unspecified: Secondary | ICD-10-CM | POA: Insufficient documentation

## 2010-12-17 DIAGNOSIS — M818 Other osteoporosis without current pathological fracture: Secondary | ICD-10-CM | POA: Insufficient documentation

## 2010-12-30 ENCOUNTER — Other Ambulatory Visit: Payer: Self-pay | Admitting: Obstetrics and Gynecology

## 2010-12-30 ENCOUNTER — Ambulatory Visit: Payer: Medicare Other | Admitting: Obstetrics and Gynecology

## 2010-12-30 ENCOUNTER — Ambulatory Visit (INDEPENDENT_AMBULATORY_CARE_PROVIDER_SITE_OTHER): Payer: Medicare Other | Admitting: Obstetrics and Gynecology

## 2010-12-30 ENCOUNTER — Ambulatory Visit: Payer: Medicare Other

## 2010-12-30 ENCOUNTER — Ambulatory Visit (INDEPENDENT_AMBULATORY_CARE_PROVIDER_SITE_OTHER): Payer: Medicare Other

## 2010-12-30 DIAGNOSIS — D219 Benign neoplasm of connective and other soft tissue, unspecified: Secondary | ICD-10-CM

## 2010-12-30 DIAGNOSIS — R1904 Left lower quadrant abdominal swelling, mass and lump: Secondary | ICD-10-CM

## 2010-12-30 DIAGNOSIS — N83209 Unspecified ovarian cyst, unspecified side: Secondary | ICD-10-CM

## 2010-12-30 DIAGNOSIS — D251 Intramural leiomyoma of uterus: Secondary | ICD-10-CM

## 2010-12-30 DIAGNOSIS — D259 Leiomyoma of uterus, unspecified: Secondary | ICD-10-CM

## 2010-12-30 DIAGNOSIS — N852 Hypertrophy of uterus: Secondary | ICD-10-CM

## 2010-12-30 DIAGNOSIS — D252 Subserosal leiomyoma of uterus: Secondary | ICD-10-CM

## 2010-12-30 NOTE — Progress Notes (Signed)
The patient came back to see me today for further management of a left ovarian cyst. She is currently asymptomatic. We have watched this since 2008. On ultrasound today she has multiple fibroids which are stable or possibly even a little smaller. There is associated with calcification. Her endometrial cavity has a stripe of 6.6 mm with linear calcifications. She has had no postmenopausal bleeding. Her right ovary is normal. Her left ovary shows a thin wall echo-free cyst. It is 3.4 cm. It is avascular. Last year it was 4.5 cm. When first discovered it was 5.9 cm. Her cul-de-sac is free of fluid.  Assessment: #1. Persistent left ovarian cyst now smaller. #2. Stable fibroids.  Plan: Patient was reassured. We will rescan her in one year.

## 2011-01-11 ENCOUNTER — Ambulatory Visit (INDEPENDENT_AMBULATORY_CARE_PROVIDER_SITE_OTHER): Payer: Medicare Other | Admitting: *Deleted

## 2011-01-11 DIAGNOSIS — I059 Rheumatic mitral valve disease, unspecified: Secondary | ICD-10-CM

## 2011-01-11 DIAGNOSIS — I359 Nonrheumatic aortic valve disorder, unspecified: Secondary | ICD-10-CM

## 2011-01-26 ENCOUNTER — Ambulatory Visit (INDEPENDENT_AMBULATORY_CARE_PROVIDER_SITE_OTHER): Payer: Medicare Other | Admitting: Cardiology

## 2011-01-26 ENCOUNTER — Encounter: Payer: Self-pay | Admitting: Cardiology

## 2011-01-26 VITALS — BP 120/68 | HR 65 | Ht 66.0 in | Wt 189.4 lb

## 2011-01-26 DIAGNOSIS — I4891 Unspecified atrial fibrillation: Secondary | ICD-10-CM

## 2011-01-26 MED ORDER — FUROSEMIDE 20 MG PO TABS: 20.0000 mg | ORAL_TABLET | Freq: Every day | ORAL | Status: DC

## 2011-01-26 NOTE — Assessment & Plan Note (Signed)
Continued SBE prophylaxis. Repeat echo one year.

## 2011-01-26 NOTE — Assessment & Plan Note (Signed)
Status post aortic valve replacement. Continue SBE prophylaxis. 

## 2011-01-26 NOTE — Assessment & Plan Note (Signed)
Blood pressure controlled. Continue present medications. 

## 2011-01-26 NOTE — Assessment & Plan Note (Signed)
Management per primary care. 

## 2011-01-26 NOTE — Progress Notes (Signed)
[id number] female with history of severe AS, mitral regurgitation, aneurysm of the ascending thoracic aorta (status post aortic valve replacement, mitral valve repair, resection and grafting of the ascending thoracic aortic aneurysm on Jul 07, 2006). History of paroxysmal atrial fibrillation (status post maze procedure on Jul 07, 2006). Followup catheterization performed secondary to elevated troponin and recurrent atrial fibrillation revealed an ejection fraction of 25-30% and occlusion of the reimplanted left main. The patient subsequently underwent redo coronary artery bypass and graft with a LIMA to the LAD and a saphenous vein graft to the OM. She also had an epicardial lead placed if she required biventricular pacing in the future. She was readmitted in August of 2011 with atrial flutter and underwent cardioversion. Her flecainide was increased to 100 mg p.o. b.i.d. A followup exercise treadmill showed no exercise induced ventricular tachycardia. She was seen by Dr. Rayann Heman. He recommended continuing management with same medications. If atrial fibrillation recurred then we could consider ablation versus tikosyn. A followup echocardiogram was performed in May 2012. Her EF was 45-50. The prosthetic aortic valve was functioning appropriately with only trivial aortic insufficiency. There was mild to moderate mitral stenosis following mitral valve repair. There was mild LAE.  I last saw her in May of 2012. Since then, the patient has dyspnea with more extreme activities but not with routine activities. It is relieved with rest. It is not associated with chest pain. There is no orthopnea, PND or pedal edema. There is no syncope or palpitations. There is no exertional chest pain.   Current Outpatient Prescriptions  Medication Sig Dispense Refill  . ACTOS 45 MG tablet TAKE 1 TABLET EVERY DAY  90 tablet  4  . Calcium Carbonate (CALCIUM 600) 1500 MG TABS Take 1 tablet by mouth as needed.       . Cholecalciferol  (VITAMIN D3) 50000 UNITS CAPS Take 1 tablet by mouth once a week.  12 capsule  3  . flecainide (TAMBOCOR) 100 MG tablet Take 1 tablet (100 mg total) by mouth 2 (two) times daily.  180 tablet  4  . furosemide (LASIX) 20 MG tablet Take 20 mg by mouth daily.        Marland Kitchen KLOR-CON M20 20 MEQ tablet TAKE 1 TABLET EVERY DAY  90 tablet  3  . metformin (FORTAMET) 500 MG (OSM) 24 hr tablet 1 tablet in the am and 3 tabs at bedtime      . metoprolol (TOPROL-XL) 100 MG 24 hr tablet        . metoprolol (TOPROL-XL) 50 MG 24 hr tablet Take 1 tablet (50 mg total) by mouth daily. Take with 100mg  tablet to make 150mg   90 tablet  4  . omeprazole (PRILOSEC OTC) 20 MG tablet Take 20 mg by mouth daily.        Marland Kitchen telmisartan (MICARDIS) 20 MG tablet Take 1/2 by mouth once daily  30 tablet  6  . vitamin B-12 (CYANOCOBALAMIN) 250 MCG tablet Take 250 mcg by mouth daily. Take 3 times a week      . warfarin (COUMADIN) 4 MG tablet Take as directed by the Anticoagulation Clinic  90 tablet  4     Past Medical History  Diagnosis Date  . Hyperlipidemia   . Anemia   . Atrial fibrillation   . Diverticulitis   . Valvular heart disease     per HPI  . CVA (cerebral vascular accident)     s/p CVA felt due to oscillating calcium on aortic valve  .  Carcinoma in situ of vulva   . Fibroid   . Ovarian cyst, left 2008-2009  . Osteoporosis   . Diabetes mellitus   . Hypertension   . GERD (gastroesophageal reflux disease)     Past Surgical History  Procedure Date  . Foot surgery     removed neuroma  . Aortic root replacement     homograft  . Mv repair     due to severe MR  . Resection and grafting of ascending thoracic aortic   . Aneurysm and left sided maze 2008  . Coronary artery bypass graft 4/10    redone due to occluded LM at implantation site (LIMA to LAD, SVG to OM)  . Wide excision of vulva     CA INSITU    History   Social History  . Marital Status: Married    Spouse Name: N/A    Number of Children: N/A  .  Years of Education: N/A   Occupational History  . Not on file.   Social History Main Topics  . Smoking status: Former Smoker    Quit date: 03/09/1983  . Smokeless tobacco: Not on file  . Alcohol Use: No  . Drug Use: No  . Sexually Active: Yes    Birth Control/ Protection: Post-menopausal   Other Topics Concern  . Not on file   Social History Narrative   No regular exercise    ROS: no fevers or chills, productive cough, hemoptysis, dysphasia, odynophagia, melena, hematochezia, dysuria, hematuria, rash, seizure activity, orthopnea, PND, pedal edema, claudication. Remaining systems are negative.  Physical Exam: Well-developed well-nourished in no acute distress.  Skin is warm and dry.  HEENT is normal.  Neck is supple. No thyromegaly.  Chest is clear to auscultation with normal expansion.  Cardiovascular exam is regular rate and rhythm. 2/6 systolic ejection murmur. No diastolic murmur. Abdominal exam nontender or distended. No masses palpated. Extremities show no edema. neuro grossly intact  ECG sinus rhythm with first degree AV block. Left axis deviation. Left bundle branch block.

## 2011-01-26 NOTE — Patient Instructions (Signed)
Your physician wants you to follow-up in: 1 year with Dr. Crenshaw. You will receive a reminder letter in the mail two months in advance. If you don't receive a letter, please call our office to schedule the follow-up appointment.  Your physician recommends that you continue on your current medications as directed. Please refer to the Current Medication list given to you today.  

## 2011-01-26 NOTE — Assessment & Plan Note (Signed)
Patient remains in sinus rhythm. Continue Toprol and flecainide. Continue Coumadin with goal INR 2-3.

## 2011-02-08 ENCOUNTER — Ambulatory Visit (INDEPENDENT_AMBULATORY_CARE_PROVIDER_SITE_OTHER): Payer: Medicare Other | Admitting: *Deleted

## 2011-02-08 DIAGNOSIS — I4891 Unspecified atrial fibrillation: Secondary | ICD-10-CM

## 2011-02-08 DIAGNOSIS — I359 Nonrheumatic aortic valve disorder, unspecified: Secondary | ICD-10-CM

## 2011-02-08 DIAGNOSIS — Z7901 Long term (current) use of anticoagulants: Secondary | ICD-10-CM

## 2011-02-08 DIAGNOSIS — Z954 Presence of other heart-valve replacement: Secondary | ICD-10-CM

## 2011-02-08 DIAGNOSIS — I059 Rheumatic mitral valve disease, unspecified: Secondary | ICD-10-CM

## 2011-03-08 ENCOUNTER — Ambulatory Visit (INDEPENDENT_AMBULATORY_CARE_PROVIDER_SITE_OTHER): Payer: Medicare Other | Admitting: *Deleted

## 2011-03-08 DIAGNOSIS — I059 Rheumatic mitral valve disease, unspecified: Secondary | ICD-10-CM

## 2011-03-08 DIAGNOSIS — Z7901 Long term (current) use of anticoagulants: Secondary | ICD-10-CM

## 2011-03-08 DIAGNOSIS — I4891 Unspecified atrial fibrillation: Secondary | ICD-10-CM

## 2011-03-08 DIAGNOSIS — Z954 Presence of other heart-valve replacement: Secondary | ICD-10-CM

## 2011-03-08 DIAGNOSIS — I359 Nonrheumatic aortic valve disorder, unspecified: Secondary | ICD-10-CM

## 2011-03-08 LAB — POCT INR: INR: 3.3

## 2011-03-12 ENCOUNTER — Encounter: Payer: Self-pay | Admitting: Internal Medicine

## 2011-03-12 ENCOUNTER — Encounter: Payer: Self-pay | Admitting: Obstetrics and Gynecology

## 2011-03-24 ENCOUNTER — Other Ambulatory Visit: Payer: Self-pay | Admitting: Internal Medicine

## 2011-03-29 ENCOUNTER — Ambulatory Visit (INDEPENDENT_AMBULATORY_CARE_PROVIDER_SITE_OTHER): Payer: Medicare Other | Admitting: *Deleted

## 2011-03-29 DIAGNOSIS — Z7901 Long term (current) use of anticoagulants: Secondary | ICD-10-CM

## 2011-03-29 DIAGNOSIS — I059 Rheumatic mitral valve disease, unspecified: Secondary | ICD-10-CM | POA: Diagnosis not present

## 2011-03-29 DIAGNOSIS — I4891 Unspecified atrial fibrillation: Secondary | ICD-10-CM

## 2011-03-29 DIAGNOSIS — I359 Nonrheumatic aortic valve disorder, unspecified: Secondary | ICD-10-CM | POA: Diagnosis not present

## 2011-04-08 ENCOUNTER — Other Ambulatory Visit (INDEPENDENT_AMBULATORY_CARE_PROVIDER_SITE_OTHER): Payer: Medicare Other

## 2011-04-08 ENCOUNTER — Encounter: Payer: Self-pay | Admitting: Internal Medicine

## 2011-04-08 ENCOUNTER — Ambulatory Visit (INDEPENDENT_AMBULATORY_CARE_PROVIDER_SITE_OTHER): Payer: Medicare Other | Admitting: Internal Medicine

## 2011-04-08 DIAGNOSIS — D509 Iron deficiency anemia, unspecified: Secondary | ICD-10-CM

## 2011-04-08 DIAGNOSIS — K219 Gastro-esophageal reflux disease without esophagitis: Secondary | ICD-10-CM

## 2011-04-08 DIAGNOSIS — E538 Deficiency of other specified B group vitamins: Secondary | ICD-10-CM

## 2011-04-08 DIAGNOSIS — IMO0001 Reserved for inherently not codable concepts without codable children: Secondary | ICD-10-CM

## 2011-04-08 DIAGNOSIS — I4891 Unspecified atrial fibrillation: Secondary | ICD-10-CM

## 2011-04-08 DIAGNOSIS — Z79899 Other long term (current) drug therapy: Secondary | ICD-10-CM

## 2011-04-08 DIAGNOSIS — E785 Hyperlipidemia, unspecified: Secondary | ICD-10-CM

## 2011-04-08 DIAGNOSIS — I10 Essential (primary) hypertension: Secondary | ICD-10-CM

## 2011-04-08 DIAGNOSIS — E559 Vitamin D deficiency, unspecified: Secondary | ICD-10-CM

## 2011-04-08 LAB — CBC WITH DIFFERENTIAL/PLATELET
Basophils Absolute: 0 10*3/uL (ref 0.0–0.1)
Eosinophils Relative: 3.2 % (ref 0.0–5.0)
Monocytes Relative: 7 % (ref 3.0–12.0)
Neutrophils Relative %: 74.3 % (ref 43.0–77.0)
Platelets: 380 10*3/uL (ref 150.0–400.0)
WBC: 6.6 10*3/uL (ref 4.5–10.5)

## 2011-04-08 LAB — FERRITIN: Ferritin: 9 ng/mL — ABNORMAL LOW (ref 10.0–291.0)

## 2011-04-08 LAB — COMPREHENSIVE METABOLIC PANEL
ALT: 9 U/L (ref 0–35)
AST: 17 U/L (ref 0–37)
CO2: 27 mEq/L (ref 19–32)
Calcium: 9.2 mg/dL (ref 8.4–10.5)
Chloride: 103 mEq/L (ref 96–112)
GFR: 63.37 mL/min (ref >60.00)
Sodium: 138 mEq/L (ref 135–145)
Total Bilirubin: 0.6 mg/dL (ref 0.3–1.2)
Total Protein: 7.3 g/dL (ref 6.0–8.3)

## 2011-04-08 LAB — HEMOGLOBIN A1C: Hgb A1c MFr Bld: 6 % (ref 4.6–6.5)

## 2011-04-08 LAB — URINALYSIS, ROUTINE W REFLEX MICROSCOPIC
Ketones, ur: NEGATIVE
Specific Gravity, Urine: 1.02 (ref 1.000–1.030)
Total Protein, Urine: NEGATIVE
Urine Glucose: NEGATIVE
Urobilinogen, UA: 0.2 (ref 0.0–1.0)

## 2011-04-08 LAB — LIPID PANEL
HDL: 62.7 mg/dL (ref >39.00)
Triglycerides: 110 mg/dL (ref 0.0–149.0)
VLDL: 22 mg/dL (ref 0.0–40.0)

## 2011-04-08 LAB — IBC PANEL: Saturation Ratios: 16 % — ABNORMAL LOW (ref 20.0–50.0)

## 2011-04-08 NOTE — Assessment & Plan Note (Signed)
Check a B12 level today 

## 2011-04-08 NOTE — Assessment & Plan Note (Signed)
She has good rate and rhythm control 

## 2011-04-08 NOTE — Patient Instructions (Signed)

## 2011-04-08 NOTE — Assessment & Plan Note (Signed)
She is doing well on omeprazole

## 2011-04-08 NOTE — Assessment & Plan Note (Signed)
She will not take a statin but still wants to check her FLP today

## 2011-04-08 NOTE — Assessment & Plan Note (Signed)
Check and a1c today and monitor her renal function

## 2011-04-08 NOTE — Assessment & Plan Note (Signed)
Her BP is well controlled, I will check her lytes and renal function 

## 2011-04-08 NOTE — Progress Notes (Signed)
Subjective:    Patient ID: [name], female    DOB: 11-Feb-1941, 71 y.o.   MRN: [id number]  Diabetes She presents for her follow-up diabetic visit. She has type 2 diabetes mellitus. There are no hypoglycemic associated symptoms. Pertinent negatives for hypoglycemia include no confusion, dizziness, headaches, pallor, seizures, speech difficulty or tremors. Pertinent negatives for diabetes include no blurred vision, no chest pain, no fatigue, no foot paresthesias, no foot ulcerations, no polydipsia, no polyphagia, no polyuria, no visual change, no weakness and no weight loss. There are no hypoglycemic complications. Symptoms are stable. There are no diabetic complications. Current diabetic treatment includes oral agent (dual therapy). She is compliant with treatment all of the time. Her weight is stable. She is following a generally healthy diet. Meal planning includes avoidance of concentrated sweets. She has not had a previous visit with a dietician. She participates in exercise intermittently. There is no change in her home blood glucose trend. An ACE inhibitor/angiotensin II receptor blocker is being taken. She does not see a podiatrist.Eye exam is current.  Anemia Presents for follow-up visit. There has been no abdominal pain, anorexia, bruising/bleeding easily, confusion, fever, leg swelling, light-headedness, malaise/fatigue, pallor, palpitations, paresthesias, pica or weight loss. Signs of blood loss that are not present include hematemesis, hematochezia, melena, menorrhagia and vaginal bleeding. There are no compliance problems.       Review of Systems  Constitutional: Negative for fever, chills, weight loss, malaise/fatigue, diaphoresis, activity change, appetite change, fatigue and unexpected weight change.  HENT: Negative.   Eyes: Negative.  Negative for blurred vision.  Respiratory: Negative for apnea, cough, choking, chest tightness, shortness of breath, wheezing and stridor.     Cardiovascular: Negative for chest pain, palpitations and leg swelling.  Gastrointestinal: Negative for nausea, vomiting, abdominal pain, diarrhea, constipation, blood in stool, melena, hematochezia, abdominal distention, anorexia and hematemesis.  Genitourinary: Negative.  Negative for polyuria, vaginal bleeding and menorrhagia.  Musculoskeletal: Negative for myalgias, back pain, joint swelling, arthralgias and gait problem.  Skin: Negative for color change, pallor, rash and wound.  Neurological: Negative for dizziness, tremors, seizures, syncope, facial asymmetry, speech difficulty, weakness, light-headedness, numbness, headaches and paresthesias.  Hematological: Negative for polydipsia, polyphagia and adenopathy. Does not bruise/bleed easily.  Psychiatric/Behavioral: Negative.  Negative for confusion.       Objective:   Physical Exam  Vitals reviewed. Constitutional: She is oriented to person, place, and time. She appears well-developed and well-nourished. No distress.  HENT:  Head: Normocephalic and atraumatic.  Mouth/Throat: Oropharynx is clear and moist. No oropharyngeal exudate.  Eyes: Conjunctivae are normal. Right eye exhibits no discharge. Left eye exhibits no discharge. No scleral icterus.  Neck: Normal range of motion. Neck supple. No JVD present. No tracheal deviation present. No thyromegaly present.  Cardiovascular: Normal rate, regular rhythm and intact distal pulses.  Exam reveals no gallop and no friction rub.   Murmur heard. Pulmonary/Chest: Breath sounds normal. No stridor. No respiratory distress. She has no wheezes. She has no rales. She exhibits no tenderness.  Abdominal: Soft. Bowel sounds are normal. She exhibits no distension and no mass. There is no tenderness. There is no rebound and no guarding.  Musculoskeletal: Normal range of motion. She exhibits no edema and no tenderness.  Lymphadenopathy:    She has no cervical adenopathy.  Neurological: She is oriented  to person, place, and time.  Skin: Skin is warm and dry. No rash noted. She is not diaphoretic. No erythema. No pallor.  Psychiatric: She has a normal  mood and affect. Her behavior is normal. Judgment and thought content normal.     Lab Results  Component Value Date   WBC 9.0 10/28/2010   HGB 11.7* 10/28/2010   HCT 35.7* 10/28/2010   PLT 410.0* 10/28/2010   GLUCOSE 121* 10/28/2010   CHOL 197 09/24/2009   TRIG 116.0 09/24/2009   HDL 57.70 09/24/2009   LDLDIRECT 132.3 10/17/2008   LDLCALC 116* 09/24/2009   ALT 11 10/28/2010   AST 17 10/28/2010   NA 140 10/28/2010   K 4.5 10/28/2010   CL 104 10/28/2010   CREATININE 0.9 10/28/2010   BUN 14 10/28/2010   CO2 28 10/28/2010   TSH 1.42 10/28/2010   INR 2.9 03/29/2011   HGBA1C 6.1 10/28/2010   MICROALBUR 1.1 10/17/2008       Assessment & Plan:

## 2011-04-26 ENCOUNTER — Ambulatory Visit (INDEPENDENT_AMBULATORY_CARE_PROVIDER_SITE_OTHER): Payer: Medicare Other | Admitting: *Deleted

## 2011-04-26 DIAGNOSIS — Z7901 Long term (current) use of anticoagulants: Secondary | ICD-10-CM | POA: Diagnosis not present

## 2011-04-26 DIAGNOSIS — I059 Rheumatic mitral valve disease, unspecified: Secondary | ICD-10-CM | POA: Diagnosis not present

## 2011-04-26 DIAGNOSIS — I4891 Unspecified atrial fibrillation: Secondary | ICD-10-CM

## 2011-04-26 DIAGNOSIS — I359 Nonrheumatic aortic valve disorder, unspecified: Secondary | ICD-10-CM | POA: Diagnosis not present

## 2011-04-26 LAB — POCT INR: INR: 2.5

## 2011-05-17 ENCOUNTER — Ambulatory Visit (INDEPENDENT_AMBULATORY_CARE_PROVIDER_SITE_OTHER): Payer: Medicare Other | Admitting: Obstetrics and Gynecology

## 2011-05-17 ENCOUNTER — Encounter: Payer: Self-pay | Admitting: Obstetrics and Gynecology

## 2011-05-17 VITALS — BP 110/70 | Ht 66.0 in | Wt 184.0 lb

## 2011-05-17 DIAGNOSIS — N83209 Unspecified ovarian cyst, unspecified side: Secondary | ICD-10-CM

## 2011-05-17 DIAGNOSIS — D259 Leiomyoma of uterus, unspecified: Secondary | ICD-10-CM

## 2011-05-17 DIAGNOSIS — Z124 Encounter for screening for malignant neoplasm of cervix: Secondary | ICD-10-CM

## 2011-05-17 DIAGNOSIS — D219 Benign neoplasm of connective and other soft tissue, unspecified: Secondary | ICD-10-CM

## 2011-05-17 DIAGNOSIS — M810 Age-related osteoporosis without current pathological fracture: Secondary | ICD-10-CM

## 2011-05-17 DIAGNOSIS — D071 Carcinoma in situ of vulva: Secondary | ICD-10-CM | POA: Diagnosis not present

## 2011-05-17 NOTE — Progress Notes (Signed)
Patient came to see me today for further followup. 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 So far she has not had a reoccurrnce. She is having no vaginal bleeding. She is having no pelvic pain. She has had a mammogram in December which was normal. She does have osteoporosis and understands the significance of this but so far has declined treatment. It was rediscussed with her today and she once again declined treatment understanding the risk of hip fracture or fracture elsewhere. So far she has not had a fracture. 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 She is a. Operative risk with coronary artery disease and atrial fibrillation. She has had 2 open heart surgeries. She is having no significant urinary symptoms.  ROS: 12 system review done. Pertinent positives above. Other positives include diabetes mellitus, celiac disease, and GERD.  Physical examination: Caryn Bee present HEENT within normal limits. Neck: Thyroid not large. No masses. Supraclavicular nodes: not enlarged. Breasts: Examined in both sitting midline position. No skin changes and no masses. Abdomen: Soft no guarding rebound or masses or hernia. Pelvic: External: Within normal limits. BUS: Within normal limits. Vaginal:within normal limits. Good estrogen effect. No evidence of cystocele rectocele or enterocele. Cervix: clean. Uterus: Normal size and shape. Adnexa: No masses in right adnexa. Left adnexa enlarged to 4 cm. Rectovaginal exam: Confirmatory and negative.  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 Fibroids #3. Persistent left ovarian cyst #4. Osteoporosis  Plan: Continue yearly mammograms. Continue followup ultrasounds. Discussed osteoporosis and patient declined treatment. Extremities: Within normal limits.

## 2011-05-24 ENCOUNTER — Ambulatory Visit (INDEPENDENT_AMBULATORY_CARE_PROVIDER_SITE_OTHER): Payer: Medicare Other

## 2011-05-24 DIAGNOSIS — I359 Nonrheumatic aortic valve disorder, unspecified: Secondary | ICD-10-CM

## 2011-05-24 DIAGNOSIS — Z7901 Long term (current) use of anticoagulants: Secondary | ICD-10-CM | POA: Diagnosis not present

## 2011-05-24 DIAGNOSIS — I059 Rheumatic mitral valve disease, unspecified: Secondary | ICD-10-CM

## 2011-05-24 DIAGNOSIS — I4891 Unspecified atrial fibrillation: Secondary | ICD-10-CM | POA: Diagnosis not present

## 2011-06-07 ENCOUNTER — Other Ambulatory Visit: Payer: Self-pay | Admitting: Cardiology

## 2011-06-08 ENCOUNTER — Other Ambulatory Visit: Payer: Self-pay | Admitting: Cardiology

## 2011-06-21 ENCOUNTER — Other Ambulatory Visit: Payer: Self-pay | Admitting: Cardiology

## 2011-07-05 ENCOUNTER — Ambulatory Visit (INDEPENDENT_AMBULATORY_CARE_PROVIDER_SITE_OTHER): Payer: Medicare Other

## 2011-07-05 DIAGNOSIS — I359 Nonrheumatic aortic valve disorder, unspecified: Secondary | ICD-10-CM

## 2011-07-05 DIAGNOSIS — I4891 Unspecified atrial fibrillation: Secondary | ICD-10-CM

## 2011-07-05 DIAGNOSIS — Z7901 Long term (current) use of anticoagulants: Secondary | ICD-10-CM

## 2011-07-05 DIAGNOSIS — I059 Rheumatic mitral valve disease, unspecified: Secondary | ICD-10-CM | POA: Diagnosis not present

## 2011-07-05 LAB — POCT INR: INR: 2.5

## 2011-08-11 ENCOUNTER — Encounter: Payer: Self-pay | Admitting: Internal Medicine

## 2011-08-11 ENCOUNTER — Ambulatory Visit (INDEPENDENT_AMBULATORY_CARE_PROVIDER_SITE_OTHER): Payer: Medicare Other | Admitting: Internal Medicine

## 2011-08-11 ENCOUNTER — Other Ambulatory Visit (INDEPENDENT_AMBULATORY_CARE_PROVIDER_SITE_OTHER): Payer: Medicare Other

## 2011-08-11 VITALS — BP 130/68 | HR 78 | Temp 98.1°F | Resp 16 | Ht 69.0 in | Wt 181.8 lb

## 2011-08-11 DIAGNOSIS — E538 Deficiency of other specified B group vitamins: Secondary | ICD-10-CM | POA: Diagnosis not present

## 2011-08-11 DIAGNOSIS — D509 Iron deficiency anemia, unspecified: Secondary | ICD-10-CM | POA: Diagnosis not present

## 2011-08-11 DIAGNOSIS — K900 Celiac disease: Secondary | ICD-10-CM

## 2011-08-11 DIAGNOSIS — I4891 Unspecified atrial fibrillation: Secondary | ICD-10-CM

## 2011-08-11 DIAGNOSIS — I10 Essential (primary) hypertension: Secondary | ICD-10-CM | POA: Diagnosis not present

## 2011-08-11 DIAGNOSIS — M818 Other osteoporosis without current pathological fracture: Secondary | ICD-10-CM

## 2011-08-11 LAB — CBC WITH DIFFERENTIAL/PLATELET
Basophils Relative: 1.6 % (ref 0.0–3.0)
Eosinophils Absolute: 0.2 10*3/uL (ref 0.0–0.7)
Eosinophils Relative: 2.6 % (ref 0.0–5.0)
Hemoglobin: 10.8 g/dL — ABNORMAL LOW (ref 12.0–15.0)
Lymphocytes Relative: 13.4 % (ref 12.0–46.0)
MCHC: 32.1 g/dL (ref 30.0–36.0)
Neutro Abs: 5.4 10*3/uL (ref 1.4–7.7)
RBC: 3.99 Mil/uL (ref 3.87–5.11)

## 2011-08-11 LAB — FERRITIN: Ferritin: 7.4 ng/mL — ABNORMAL LOW (ref 10.0–291.0)

## 2011-08-11 LAB — IBC PANEL: Iron: 64 ug/dL (ref 42–145)

## 2011-08-11 LAB — COMPREHENSIVE METABOLIC PANEL
AST: 17 U/L (ref 0–37)
BUN: 18 mg/dL (ref 6–23)
CO2: 26 mEq/L (ref 19–32)
Calcium: 9.3 mg/dL (ref 8.4–10.5)
Chloride: 105 mEq/L (ref 96–112)
Creatinine, Ser: 1 mg/dL (ref 0.4–1.2)
GFR: 55.64 mL/min — ABNORMAL LOW (ref >60.00)
Glucose, Bld: 104 mg/dL — ABNORMAL HIGH (ref 70–99)

## 2011-08-11 LAB — FOLATE: Folate: 24.8 ng/mL (ref >5.9)

## 2011-08-11 NOTE — Assessment & Plan Note (Signed)
I will check her a1c and will monitor her renal function 

## 2011-08-11 NOTE — Progress Notes (Signed)
Subjective:    Patient ID: [name], female    DOB: Mar 15, 1940, 71 y.o.   MRN: [id number]  Anemia Presents for follow-up visit. There has been no abdominal pain, anorexia, bruising/bleeding easily, confusion, fever, leg swelling, light-headedness, malaise/fatigue, pallor, palpitations, paresthesias, pica or weight loss. Signs of blood loss that are not present include hematemesis, hematochezia, melena and vaginal bleeding. There are no compliance problems.   Diabetes She presents for her follow-up diabetic visit. She has type 2 diabetes mellitus. Her disease course has been stable. There are no hypoglycemic associated symptoms. Pertinent negatives for hypoglycemia include no confusion or pallor. Pertinent negatives for diabetes include no blurred vision, no chest pain, no fatigue, no foot paresthesias, no foot ulcerations, no polydipsia, no polyphagia, no polyuria, no visual change, no weakness and no weight loss. There are no hypoglycemic complications. Symptoms are stable. Diabetic complications include heart disease. Current diabetic treatment includes oral agent (dual therapy). She is compliant with treatment all of the time. Her weight is stable. She is following a generally healthy diet. Meal planning includes avoidance of concentrated sweets. She has not had a previous visit with a dietician. She participates in exercise intermittently. There is no change in her home blood glucose trend. An ACE inhibitor/angiotensin II receptor blocker is being taken. She does not see a podiatrist.Eye exam is not current.      Review of Systems  Constitutional: Negative for fever, chills, weight loss, malaise/fatigue, diaphoresis, activity change, appetite change, fatigue and unexpected weight change.  HENT: Negative.   Eyes: Negative.  Negative for blurred vision.  Respiratory: Negative for apnea, cough, choking, shortness of breath, wheezing and stridor.   Cardiovascular: Negative for chest pain,  palpitations and leg swelling.  Gastrointestinal: Negative for nausea, vomiting, abdominal pain, diarrhea, constipation, blood in stool, melena, hematochezia, abdominal distention, anal bleeding, anorexia and hematemesis.  Genitourinary: Negative for dysuria, urgency, polyuria, frequency, hematuria, flank pain, decreased urine volume, vaginal bleeding, enuresis, difficulty urinating, pelvic pain and dyspareunia.  Musculoskeletal: Negative for myalgias, back pain, joint swelling, arthralgias and gait problem.  Skin: Negative for color change, pallor, rash and wound.  Neurological: Negative.  Negative for weakness, light-headedness and paresthesias.  Hematological: Negative for polydipsia, polyphagia and adenopathy. Does not bruise/bleed easily.  Psychiatric/Behavioral: Negative.  Negative for confusion.       Objective:   Physical Exam  Vitals reviewed. Constitutional: She is oriented to person, place, and time. She appears well-developed and well-nourished.  Non-toxic appearance. She does not have a sickly appearance. She does not appear ill. No distress.  HENT:  Head: Normocephalic and atraumatic.  Mouth/Throat: Oropharynx is clear and moist. Mucous membranes are pale, not dry and not cyanotic. No oropharyngeal exudate.  Eyes: Conjunctivae are normal. Right eye exhibits no discharge. Left eye exhibits no discharge. No scleral icterus.  Neck: Normal range of motion. Neck supple. No JVD present. No tracheal deviation present. No thyromegaly present.  Cardiovascular: Normal rate, S1 normal, S2 normal and intact distal pulses.  An irregularly irregular rhythm present. Exam reveals no gallop and no friction rub.   Murmur heard.  Systolic murmur is present with a grade of 1/6   No diastolic murmur is present  Pulmonary/Chest: Effort normal and breath sounds normal. No stridor. No respiratory distress. She has no wheezes. She has no rales. She exhibits no tenderness.  Abdominal: Soft. Bowel sounds  are normal. She exhibits no distension and no mass. There is no tenderness. There is no rebound and no guarding.  Musculoskeletal:  Normal range of motion. She exhibits no edema and no tenderness.  Lymphadenopathy:    She has no cervical adenopathy.  Neurological: She is oriented to person, place, and time.  Skin: Skin is warm and dry. No rash noted. She is not diaphoretic. No erythema. No pallor.  Psychiatric: She has a normal mood and affect. Her behavior is normal. Judgment and thought content normal.      Lab Results  Component Value Date   WBC 6.6 04/08/2011   HGB 10.9* 04/08/2011   HCT 33.0* 04/08/2011   PLT 380.0 04/08/2011   GLUCOSE 110* 04/08/2011   CHOL 200 04/08/2011   TRIG 110.0 04/08/2011   HDL 62.70 04/08/2011   LDLDIRECT 132.3 10/17/2008   LDLCALC 115* 04/08/2011   ALT 9 04/08/2011   AST 17 04/08/2011   NA 138 04/08/2011   K 4.7 04/08/2011   CL 103 04/08/2011   CREATININE 0.9 04/08/2011   BUN 21 04/08/2011   CO2 27 04/08/2011   TSH 1.36 04/08/2011   INR 2.5 07/05/2011   HGBA1C 6.0 04/08/2011   MICROALBUR 1.1 10/17/2008      Assessment & Plan:

## 2011-08-11 NOTE — Assessment & Plan Note (Signed)
Her BP is well controlled, I will check her lytes and renal function today 

## 2011-08-11 NOTE — Assessment & Plan Note (Signed)
For a CBC and B12 level today

## 2011-08-11 NOTE — Assessment & Plan Note (Signed)
I will check her CBC and her iron levels today

## 2011-08-11 NOTE — Assessment & Plan Note (Signed)
She has good rate control 

## 2011-08-11 NOTE — Assessment & Plan Note (Signed)
She is due for a BMD test

## 2011-08-11 NOTE — Patient Instructions (Signed)
Diabetes, Type 2 Diabetes is a long-lasting (chronic) disease. In type 2 diabetes, the pancreas does not make enough insulin (a hormone), and the body does not respond normally to the insulin that is made. This type of diabetes was also previously called adult-onset diabetes. It usually occurs after the age of 37, but it can occur at any age.  CAUSES  Type 2 diabetes happens because the pancreasis not making enough insulin or your body has trouble using the insulin that your pancreas does make properly. SYMPTOMS   Drinking more than usual.   Urinating more than usual.   Blurred vision.   Dry, itchy skin.   Frequent infections.   Feeling more tired than usual (fatigue).  DIAGNOSIS The diagnosis of type 2 diabetes is usually made by one of the following tests:  Fasting blood glucose test. You will not eat for at least 8 hours and then take a blood test.   Random blood glucose test. Your blood glucose (sugar) is checked at any time of the day regardless of when you ate.   Oral glucose tolerance test (OGTT). Your blood glucose is measured after you have not eaten (fasted) and then after you drink a glucose containing beverage.  TREATMENT   Healthy eating.   Exercise.   Medicine, if needed.   Monitoring blood glucose.   Seeing your caregiver regularly.  HOME CARE INSTRUCTIONS   Check your blood glucose at least once a day. More frequent monitoring may be necessary, depending on your medicines and on how well your diabetes is controlled. Your caregiver will advise you.   Take your medicine as directed by your caregiver.   Do not smoke.   Make wise food choices. Ask your caregiver for information. Weight loss can improve your diabetes.   Learn about low blood glucose (hypoglycemia) and how to treat it.   Get your eyes checked regularly.   Have a yearly physical exam. Have your blood pressure checked and your blood and urine tested.   Wear a pendant or bracelet saying  that you have diabetes.   Check your feet every night for cuts, sores, blisters, and redness. Let your caregiver know if you have any problems.  SEEK MEDICAL CARE IF:   You have problems keeping your blood glucose in target range.   You have problems with your medicines.   You have symptoms of an illness that do not improve after 24 hours.   You have a sore or wound that is not healing.   You notice a change in vision or a new problem with your vision.   You have a fever.  MAKE SURE YOU:  Understand these instructions.   Will watch your condition.   Will get help right away if you are not doing well or get worse.  Document Released: 02/22/2005 Document Revised: 02/11/2011 Document Reviewed: 08/10/2010 [facility] Patient Information 2012 [place].Iron Deficiency Anemia There are many types of anemia. Iron deficiency anemia is the most common. Iron deficiency anemia is a decrease in the number of red blood cells caused by too little iron. Without enough iron, your body does not produce enough hemoglobin. Hemoglobin is a substance in red blood cells that carries oxygen to the body's tissues. Iron deficiency anemia may leave you tired and short of breath. CAUSES   Lack of iron in the diet.   This may be seen in infants and children, because there is little iron in milk.   This may be seen in adults who  do not eat enough iron-rich foods.   This may be seen in pregnant or breastfeeding women who do not take iron supplements. There is a much higher need for iron intake at these times.   Poor absorption of iron, as seen with intestinal disorders.   Intestinal bleeding.   Heavy periods.  SYMPTOMS  Mild anemia may not be noticeable. Symptoms may include:  Fatigue.   Headache.   Pale skin.   Weakness.   Shortness of breath.   Dizziness.   Cold hands and feet.   Fast or irregular heartbeat.  DIAGNOSIS  Diagnosis requires a thorough evaluation and physical exam by  your caregiver.  Blood tests are generally used to confirm iron deficiency anemia.   Additional tests may be done to find the underlying cause of your anemia. These may include:   Testing for blood in the stool (fecal occult blood test).   A procedure to see inside the colon and rectum (colonoscopy).   A procedure to see inside the esophagus and stomach (endoscopy).  TREATMENT   Correcting the cause of the iron deficiency is the first step.   Medicines, such as oral contraceptives, can make heavy menstrual flows lighter.   Antibiotics and other medicines can be used to treat peptic ulcers.   Surgery may be needed to remove a bleeding polyp, tumor, or fibroid.   Often, iron supplements (ferrous sulfate) are taken.   For the best iron absorption, take these supplements with an empty stomach.   You may need to take the supplements with food if you cannot tolerate them on an empty stomach. Vitamin C improves the absorption of iron. Your caregiver may recommend taking your iron tablets with a glass of orange juice or vitamin C supplement.   Milk and antacids should not be taken at the same time as iron supplements. They may interfere with the absorption of iron.   Iron supplements can cause constipation. A stool softener is often recommended.   Pregnant and breastfeeding women will need to take extra iron, because their normal diet usually will not provide the required amount.   Patients who cannot tolerate iron by mouth can take it through a vein (intravenously) or by an injection into the muscle.  HOME CARE INSTRUCTIONS   Ask your dietitian for help with diet questions.   Take iron and vitamins as directed by your caregiver.   Eat a diet rich in iron. Eat liver, lean beef, whole-grain bread, eggs, dried fruit, and dark green leafy vegetables.  SEEK IMMEDIATE MEDICAL CARE IF:   You have a fainting episode. Do not drive yourself. Call your local emergency services (911 in [place]  if no other help is available.   You have chest pain, nausea, or vomiting.   You develop severe or increased shortness of breath with activities.   You develop weakness or increased thirst.   You have a rapid heartbeat.   You develop unexplained sweating or become lightheaded when getting up from a chair or bed.  MAKE SURE YOU:   Understand these instructions.   Will watch your condition.   Will get help right away if you are not doing well or get worse.  Document Released: 02/20/2000 Document Revised: 02/11/2011 Document Reviewed: 07/01/2009 [facility] Patient Information 2012 [place].

## 2011-08-16 ENCOUNTER — Ambulatory Visit (INDEPENDENT_AMBULATORY_CARE_PROVIDER_SITE_OTHER): Payer: Medicare Other | Admitting: *Deleted

## 2011-08-16 DIAGNOSIS — I359 Nonrheumatic aortic valve disorder, unspecified: Secondary | ICD-10-CM | POA: Diagnosis not present

## 2011-08-16 DIAGNOSIS — I4891 Unspecified atrial fibrillation: Secondary | ICD-10-CM | POA: Diagnosis not present

## 2011-08-16 DIAGNOSIS — I059 Rheumatic mitral valve disease, unspecified: Secondary | ICD-10-CM | POA: Diagnosis not present

## 2011-08-16 DIAGNOSIS — Z7901 Long term (current) use of anticoagulants: Secondary | ICD-10-CM | POA: Diagnosis not present

## 2011-08-29 ENCOUNTER — Other Ambulatory Visit: Payer: Self-pay | Admitting: Internal Medicine

## 2011-09-07 DIAGNOSIS — E119 Type 2 diabetes mellitus without complications: Secondary | ICD-10-CM | POA: Diagnosis not present

## 2011-09-08 ENCOUNTER — Telehealth: Payer: Self-pay | Admitting: Internal Medicine

## 2011-09-08 NOTE — Telephone Encounter (Signed)
Per pt she do not desire bone density--she will discuss this with Dr Ronnald Ramp on next appt with Dr Ronnald Ramp per pt

## 2011-09-13 ENCOUNTER — Other Ambulatory Visit: Payer: Self-pay

## 2011-09-13 MED ORDER — PIOGLITAZONE HCL 45 MG PO TABS: ORAL_TABLET | ORAL | Status: DC

## 2011-09-22 LAB — HM DIABETES EYE EXAM

## 2011-09-27 ENCOUNTER — Ambulatory Visit (INDEPENDENT_AMBULATORY_CARE_PROVIDER_SITE_OTHER): Payer: Medicare Other

## 2011-09-27 DIAGNOSIS — I4891 Unspecified atrial fibrillation: Secondary | ICD-10-CM

## 2011-09-27 DIAGNOSIS — I059 Rheumatic mitral valve disease, unspecified: Secondary | ICD-10-CM | POA: Diagnosis not present

## 2011-09-27 DIAGNOSIS — Z7901 Long term (current) use of anticoagulants: Secondary | ICD-10-CM | POA: Diagnosis not present

## 2011-09-27 DIAGNOSIS — I359 Nonrheumatic aortic valve disorder, unspecified: Secondary | ICD-10-CM

## 2011-09-27 LAB — POCT INR: INR: 2.1

## 2011-11-09 ENCOUNTER — Ambulatory Visit (INDEPENDENT_AMBULATORY_CARE_PROVIDER_SITE_OTHER): Payer: Medicare Other | Admitting: Pharmacist

## 2011-11-09 DIAGNOSIS — I059 Rheumatic mitral valve disease, unspecified: Secondary | ICD-10-CM | POA: Diagnosis not present

## 2011-11-09 DIAGNOSIS — I4891 Unspecified atrial fibrillation: Secondary | ICD-10-CM

## 2011-11-09 DIAGNOSIS — Z7901 Long term (current) use of anticoagulants: Secondary | ICD-10-CM

## 2011-11-09 DIAGNOSIS — I359 Nonrheumatic aortic valve disorder, unspecified: Secondary | ICD-10-CM | POA: Diagnosis not present

## 2011-11-17 ENCOUNTER — Other Ambulatory Visit: Payer: Self-pay | Admitting: Cardiology

## 2011-12-17 ENCOUNTER — Other Ambulatory Visit: Payer: Self-pay | Admitting: Cardiology

## 2011-12-20 ENCOUNTER — Ambulatory Visit (INDEPENDENT_AMBULATORY_CARE_PROVIDER_SITE_OTHER): Payer: Medicare Other | Admitting: *Deleted

## 2011-12-20 DIAGNOSIS — I059 Rheumatic mitral valve disease, unspecified: Secondary | ICD-10-CM | POA: Diagnosis not present

## 2011-12-20 DIAGNOSIS — Z7901 Long term (current) use of anticoagulants: Secondary | ICD-10-CM | POA: Diagnosis not present

## 2011-12-20 DIAGNOSIS — I4891 Unspecified atrial fibrillation: Secondary | ICD-10-CM | POA: Diagnosis not present

## 2011-12-20 DIAGNOSIS — I359 Nonrheumatic aortic valve disorder, unspecified: Secondary | ICD-10-CM | POA: Diagnosis not present

## 2011-12-21 ENCOUNTER — Other Ambulatory Visit: Payer: Self-pay | Admitting: Cardiology

## 2011-12-27 ENCOUNTER — Encounter: Payer: Self-pay | Admitting: Internal Medicine

## 2011-12-27 ENCOUNTER — Other Ambulatory Visit (INDEPENDENT_AMBULATORY_CARE_PROVIDER_SITE_OTHER): Payer: Medicare Other

## 2011-12-27 ENCOUNTER — Ambulatory Visit (INDEPENDENT_AMBULATORY_CARE_PROVIDER_SITE_OTHER): Payer: Medicare Other | Admitting: Internal Medicine

## 2011-12-27 VITALS — BP 136/68 | HR 67 | Temp 97.6°F | Resp 16 | Ht 66.0 in | Wt 186.0 lb

## 2011-12-27 DIAGNOSIS — I10 Essential (primary) hypertension: Secondary | ICD-10-CM

## 2011-12-27 DIAGNOSIS — D509 Iron deficiency anemia, unspecified: Secondary | ICD-10-CM

## 2011-12-27 DIAGNOSIS — E538 Deficiency of other specified B group vitamins: Secondary | ICD-10-CM | POA: Diagnosis not present

## 2011-12-27 DIAGNOSIS — I4891 Unspecified atrial fibrillation: Secondary | ICD-10-CM

## 2011-12-27 DIAGNOSIS — IMO0001 Reserved for inherently not codable concepts without codable children: Secondary | ICD-10-CM

## 2011-12-27 LAB — CBC WITH DIFFERENTIAL/PLATELET
Basophils Absolute: 0.1 10*3/uL (ref 0.0–0.1)
Eosinophils Absolute: 0.2 10*3/uL (ref 0.0–0.7)
HCT: 31.2 % — ABNORMAL LOW (ref 36.0–46.0)
Hemoglobin: 9.9 g/dL — ABNORMAL LOW (ref 12.0–15.0)
Lymphs Abs: 0.9 10*3/uL (ref 0.7–4.0)
MCHC: 31.7 g/dL (ref 30.0–36.0)
MCV: 83.1 fl (ref 78.0–100.0)
Neutro Abs: 5.9 10*3/uL (ref 1.4–7.7)
RDW: 14.8 % — ABNORMAL HIGH (ref 11.5–14.6)

## 2011-12-27 LAB — COMPREHENSIVE METABOLIC PANEL
ALT: 11 U/L (ref 0–35)
AST: 27 U/L (ref 0–37)
Chloride: 106 mEq/L (ref 96–112)
Creatinine, Ser: 1 mg/dL (ref 0.4–1.2)
Total Bilirubin: 0.7 mg/dL (ref 0.3–1.2)

## 2011-12-27 LAB — HM DIABETES FOOT EXAM: HM Diabetic Foot Exam: NORMAL

## 2011-12-27 LAB — HEMOGLOBIN A1C: Hgb A1c MFr Bld: 6.2 % (ref 4.6–6.5)

## 2011-12-27 LAB — IBC PANEL: Iron: 37 ug/dL — ABNORMAL LOW (ref 42–145)

## 2011-12-27 NOTE — Assessment & Plan Note (Signed)
Her BP is well controlled, I will check her lytes and renal function today 

## 2011-12-27 NOTE — Progress Notes (Signed)
  Subjective:    Patient ID: [name], female    DOB: 1940/10/14, 71 y.o.   MRN: [id number]  Anemia Presents for follow-up visit. Symptoms include malaise/fatigue and pallor. There has been no abdominal pain, anorexia, bruising/bleeding easily, confusion, fever, leg swelling, light-headedness, palpitations, paresthesias, pica or weight loss. Signs of blood loss that are not present include hematemesis, hematochezia, melena and vaginal bleeding. There are no compliance problems.       Review of Systems  Constitutional: Positive for malaise/fatigue. Negative for fever, chills, weight loss, diaphoresis, activity change, appetite change, fatigue and unexpected weight change.  HENT: Negative.   Eyes: Negative.   Respiratory: Negative for apnea, cough, chest tightness, shortness of breath, wheezing and stridor.   Cardiovascular: Negative for chest pain, palpitations and leg swelling.  Gastrointestinal: Negative for nausea, vomiting, abdominal pain, diarrhea, constipation, blood in stool, melena, hematochezia, abdominal distention, anorexia and hematemesis.  Genitourinary: Negative.  Negative for vaginal bleeding.  Musculoskeletal: Negative.   Skin: Positive for pallor. Negative for color change, rash and wound.  Neurological: Negative for dizziness, tremors, seizures, syncope, facial asymmetry, speech difficulty, weakness, light-headedness, numbness, headaches and paresthesias.  Hematological: Negative for adenopathy. Does not bruise/bleed easily.  Psychiatric/Behavioral: Negative.  Negative for confusion.       Objective:   Physical Exam  Vitals reviewed. Constitutional: She is oriented to person, place, and time. She appears well-developed and well-nourished. No distress.  HENT:  Head: Normocephalic and atraumatic.  Mouth/Throat: Oropharynx is clear and moist. No oropharyngeal exudate.  Eyes: Conjunctivae normal are normal. Right eye exhibits no discharge. Left eye exhibits no  discharge. No scleral icterus.  Neck: Normal range of motion. Neck supple. No JVD present. No tracheal deviation present. No thyromegaly present.  Cardiovascular: Normal rate, regular rhythm and intact distal pulses.  Exam reveals no gallop and no friction rub.   Murmur heard. Pulmonary/Chest: Effort normal and breath sounds normal. No stridor. No respiratory distress. She has no wheezes. She has no rales. She exhibits no tenderness.  Abdominal: Soft. Bowel sounds are normal. She exhibits no distension and no mass. There is no tenderness. There is no rebound and no guarding.  Musculoskeletal: Normal range of motion. She exhibits edema (1+ edema in BLE). She exhibits no tenderness.  Lymphadenopathy:    She has no cervical adenopathy.  Neurological: She is oriented to person, place, and time.  Skin: Skin is warm and dry. No rash noted. She is not diaphoretic. No erythema. No pallor.  Psychiatric: She has a normal mood and affect. Her behavior is normal. Judgment and thought content normal.      Lab Results  Component Value Date   WBC 7.1 08/11/2011   HGB 10.8* 08/11/2011   HCT 33.5* 08/11/2011   PLT 397.0 08/11/2011   GLUCOSE 104* 08/11/2011   CHOL 200 04/08/2011   TRIG 110.0 04/08/2011   HDL 62.70 04/08/2011   LDLDIRECT 132.3 10/17/2008   LDLCALC 115* 04/08/2011   ALT 9 08/11/2011   AST 17 08/11/2011   NA 139 08/11/2011   K 4.9 08/11/2011   CL 105 08/11/2011   CREATININE 1.0 08/11/2011   BUN 18 08/11/2011   CO2 26 08/11/2011   TSH 1.21 08/11/2011   INR 3.0 12/20/2011   HGBA1C 6.0 08/11/2011   MICROALBUR 1.1 10/17/2008      Assessment & Plan:

## 2011-12-27 NOTE — Assessment & Plan Note (Signed)
I will check her a1c and will monitor her renal function 

## 2011-12-27 NOTE — Assessment & Plan Note (Signed)
CBC and B12 level today 

## 2011-12-27 NOTE — Assessment & Plan Note (Signed)
CBC and iron levels today

## 2011-12-27 NOTE — Assessment & Plan Note (Signed)
She has good rate and rhythm control today 

## 2011-12-27 NOTE — Patient Instructions (Signed)

## 2011-12-28 ENCOUNTER — Telehealth: Payer: Self-pay | Admitting: Cardiology

## 2011-12-28 DIAGNOSIS — I059 Rheumatic mitral valve disease, unspecified: Secondary | ICD-10-CM

## 2011-12-28 NOTE — Telephone Encounter (Signed)
New Problem:    Patient called in and wanted to know if she needed to have an ECHO.  Patient would like to have it done, if she needs to, before her appointment.  Please call back.

## 2011-12-28 NOTE — Telephone Encounter (Signed)
Spoke with pt, she has not had an echo in over one year. She is concerned about a discomfort she has had the last 6 months. It is located in the left breast, she states it feels like a muscle spasm. It last for about 5 to 10 min and will go away. It is not related to exertion, eating or movement. She will relax and it will go away. She wants to have an echo to make sure everything there is ok. Echo scheduled. She will start noticing what makes this discomfort worse and other details between now and her echo.

## 2011-12-30 ENCOUNTER — Encounter: Payer: Self-pay | Admitting: Internal Medicine

## 2011-12-31 ENCOUNTER — Ambulatory Visit (HOSPITAL_COMMUNITY): Payer: Medicare Other | Attending: Internal Medicine

## 2011-12-31 DIAGNOSIS — I379 Nonrheumatic pulmonary valve disorder, unspecified: Secondary | ICD-10-CM | POA: Diagnosis not present

## 2011-12-31 DIAGNOSIS — I052 Rheumatic mitral stenosis with insufficiency: Secondary | ICD-10-CM | POA: Insufficient documentation

## 2011-12-31 DIAGNOSIS — I4891 Unspecified atrial fibrillation: Secondary | ICD-10-CM | POA: Insufficient documentation

## 2011-12-31 DIAGNOSIS — I10 Essential (primary) hypertension: Secondary | ICD-10-CM | POA: Diagnosis not present

## 2011-12-31 DIAGNOSIS — I369 Nonrheumatic tricuspid valve disorder, unspecified: Secondary | ICD-10-CM | POA: Diagnosis not present

## 2011-12-31 DIAGNOSIS — I059 Rheumatic mitral valve disease, unspecified: Secondary | ICD-10-CM

## 2011-12-31 DIAGNOSIS — E119 Type 2 diabetes mellitus without complications: Secondary | ICD-10-CM | POA: Diagnosis not present

## 2011-12-31 DIAGNOSIS — R072 Precordial pain: Secondary | ICD-10-CM

## 2011-12-31 NOTE — Progress Notes (Signed)
Echocardiogram performed.  

## 2012-01-03 ENCOUNTER — Telehealth: Payer: Self-pay | Admitting: *Deleted

## 2012-01-03 ENCOUNTER — Other Ambulatory Visit: Payer: Self-pay | Admitting: *Deleted

## 2012-01-03 ENCOUNTER — Ambulatory Visit (INDEPENDENT_AMBULATORY_CARE_PROVIDER_SITE_OTHER): Payer: Medicare Other | Admitting: Obstetrics and Gynecology

## 2012-01-03 DIAGNOSIS — I429 Cardiomyopathy, unspecified: Secondary | ICD-10-CM

## 2012-01-03 DIAGNOSIS — R319 Hematuria, unspecified: Secondary | ICD-10-CM

## 2012-01-03 LAB — URINALYSIS W MICROSCOPIC + REFLEX CULTURE
Glucose, UA: NEGATIVE mg/dL
Protein, ur: NEGATIVE mg/dL
pH: 5 (ref 5.0–8.0)

## 2012-01-03 NOTE — Telephone Encounter (Signed)
Left message for patient to call and schedule [place] Study.

## 2012-01-03 NOTE — Progress Notes (Signed)
Patient came to see me today because of a 3 to four-month history of hematuria. She did not come in sooner because she was asymptomatic in terms of dysuria or frequency. She is anticoagulated with Coumadin. She also mentioned to me that she is not  100% sure That the blood is coming from the bladder. She has a very long history of anemia but is gotten progressively worse since the last year. She sees Dr. Ronnald Ramp. She said he didn't comment on that  when she saw him.  Pelvic exam: External within normal limits. BUS within normal limits. Vaginal exam within normal limits. Cervix is clean without lesions. Uterus is normal size and shape. Adnexa failed to reveal masses. Rectovaginal examination is confirmatory and without masses. Caryn Bee present.  Urinalysis: 7-10 white blood cells per high-power field. 7-10 red blood cells per high-power field. Many bacteria.  Assessment: Abnormal urinalysis. Hematuria. Anemia.  Plan: Urine culture. Obviously if urine culture negative urological referral. The question is whether she needs a pelvic ultrasound to rule out gynecological cause of bleeding. We will decide after urine culture. At some point I think I also need to talk to Dr. Ronnald Ramp regarding increasing anemia.

## 2012-01-03 NOTE — Patient Instructions (Signed)
We will call you with culture results.

## 2012-01-05 ENCOUNTER — Ambulatory Visit (HOSPITAL_COMMUNITY): Payer: Medicare Other | Attending: Cardiology | Admitting: Radiology

## 2012-01-05 DIAGNOSIS — I10 Essential (primary) hypertension: Secondary | ICD-10-CM | POA: Diagnosis not present

## 2012-01-05 DIAGNOSIS — I428 Other cardiomyopathies: Secondary | ICD-10-CM | POA: Diagnosis not present

## 2012-01-05 DIAGNOSIS — Z8673 Personal history of transient ischemic attack (TIA), and cerebral infarction without residual deficits: Secondary | ICD-10-CM | POA: Diagnosis not present

## 2012-01-05 DIAGNOSIS — E785 Hyperlipidemia, unspecified: Secondary | ICD-10-CM | POA: Insufficient documentation

## 2012-01-05 DIAGNOSIS — I4891 Unspecified atrial fibrillation: Secondary | ICD-10-CM

## 2012-01-05 DIAGNOSIS — I429 Cardiomyopathy, unspecified: Secondary | ICD-10-CM

## 2012-01-05 DIAGNOSIS — I2789 Other specified pulmonary heart diseases: Secondary | ICD-10-CM | POA: Insufficient documentation

## 2012-01-05 DIAGNOSIS — Z87891 Personal history of nicotine dependence: Secondary | ICD-10-CM | POA: Insufficient documentation

## 2012-01-05 MED ORDER — TECHNETIUM TC 99M-LABELED RED BLOOD CELLS IV KIT
30.0000 | PACK | Freq: Once | INTRAVENOUS | Status: AC | PRN
  Administered 2012-01-05: 30 via INTRAVENOUS

## 2012-01-05 NOTE — Progress Notes (Signed)
Muga Study  Referring Provider:  Lelon Perla, MD  Date of Procedure: 01/05/2012  Indication: Assess LVH  History: 12/31/11 Echo: EF= 35-40%; 4/08 Heart Cath: N/O CAD, Mod. Pulm. HTN, Thoracic Aneurysm Repair, H/O Maze procedure  Cardiac Risk Factors: CVA, History of Smoking, HTN, Lipids  20g IV access to Right AC, Eliezer Lofts, EMT-P   Muga Information:  The patient's red blood cells were labeled using the Ultra Tag method with 33.0 mci of Technetium 46m Pertechnetate.  The images were reconstructed in the Anterior, Lateral and Left Anterior oblique views.  Impression: There is global hypokinesis.  The EF is 35%  [name]

## 2012-01-07 DIAGNOSIS — Z23 Encounter for immunization: Secondary | ICD-10-CM | POA: Diagnosis not present

## 2012-01-19 ENCOUNTER — Other Ambulatory Visit: Payer: Self-pay | Admitting: Obstetrics and Gynecology

## 2012-01-19 DIAGNOSIS — N939 Abnormal uterine and vaginal bleeding, unspecified: Secondary | ICD-10-CM

## 2012-01-24 ENCOUNTER — Ambulatory Visit (INDEPENDENT_AMBULATORY_CARE_PROVIDER_SITE_OTHER): Payer: Medicare Other

## 2012-01-24 ENCOUNTER — Ambulatory Visit (INDEPENDENT_AMBULATORY_CARE_PROVIDER_SITE_OTHER): Payer: Medicare Other | Admitting: Obstetrics and Gynecology

## 2012-01-24 DIAGNOSIS — D251 Intramural leiomyoma of uterus: Secondary | ICD-10-CM

## 2012-01-24 DIAGNOSIS — D3910 Neoplasm of uncertain behavior of unspecified ovary: Secondary | ICD-10-CM

## 2012-01-24 DIAGNOSIS — N939 Abnormal uterine and vaginal bleeding, unspecified: Secondary | ICD-10-CM

## 2012-01-24 DIAGNOSIS — D259 Leiomyoma of uterus, unspecified: Secondary | ICD-10-CM

## 2012-01-24 DIAGNOSIS — D219 Benign neoplasm of connective and other soft tissue, unspecified: Secondary | ICD-10-CM

## 2012-01-24 DIAGNOSIS — N950 Postmenopausal bleeding: Secondary | ICD-10-CM | POA: Diagnosis not present

## 2012-01-24 DIAGNOSIS — N83209 Unspecified ovarian cyst, unspecified side: Secondary | ICD-10-CM

## 2012-01-24 DIAGNOSIS — D252 Subserosal leiomyoma of uterus: Secondary | ICD-10-CM | POA: Diagnosis not present

## 2012-01-24 DIAGNOSIS — N898 Other specified noninflammatory disorders of vagina: Secondary | ICD-10-CM | POA: Diagnosis not present

## 2012-01-24 NOTE — Progress Notes (Signed)
Patient came back to see me today for pelvic ultrasound. We have seen her several weeks ago and her urine culture was negative. She does have microscopic hematuria. She has an appointment to see the urologist this week. She also remains on Coumadin. There is some question in her mind whether this bleeding is coming from her bladder or vagina. We have also been watching her with fibroids and a left ovarian cyst.  On ultrasound today her uterus is enlarged by multiple fibroids. They are consistent in size with last ultrasound. Her endometrial echo is enlarged at 8.6 mm. Last ultrasound which was in October, 2012 the echo was 6.6 mm. Right ovary remains normal. The left ovary continues to show a thin-walled echo-free cyst of 3.7 cm. This is unchanged from one year ago. The cyst is slightly irregular. It is avascular.  Assessment: #1. Fibroids #2. Stable ovarian cyst #3. Postmenopausal bleeding versus hematuria  Plan: I have asked her to do a tampon test. We will see what the urologist says. Under normal circumstances I would do an endometrial biopsy because of the enlarged endometrial cavity to clear the air about endometrial atypia. I need Dr. Jacalyn Lefevre opinion as to both whether it is possible to stop her Coumadin so I can do a safe biopsy. I am also interested in his opinion regarding Coumadin's role in this bleeding. I have asked her to ask him to send a copy of his note from this week. I have asked the patient to call me back in a week. The patient also needs to return in February for her normal visit.

## 2012-01-24 NOTE — Patient Instructions (Signed)
Call me in one week and report on visits with cardiologist and urologist.

## 2012-01-26 ENCOUNTER — Ambulatory Visit (INDEPENDENT_AMBULATORY_CARE_PROVIDER_SITE_OTHER): Payer: Medicare Other | Admitting: Pharmacist

## 2012-01-26 ENCOUNTER — Encounter: Payer: Self-pay | Admitting: Cardiology

## 2012-01-26 ENCOUNTER — Ambulatory Visit (INDEPENDENT_AMBULATORY_CARE_PROVIDER_SITE_OTHER): Payer: Medicare Other | Admitting: Cardiology

## 2012-01-26 VITALS — BP 127/77 | HR 92 | Ht 66.0 in | Wt 181.0 lb

## 2012-01-26 DIAGNOSIS — I255 Ischemic cardiomyopathy: Secondary | ICD-10-CM | POA: Insufficient documentation

## 2012-01-26 DIAGNOSIS — I10 Essential (primary) hypertension: Secondary | ICD-10-CM

## 2012-01-26 DIAGNOSIS — I359 Nonrheumatic aortic valve disorder, unspecified: Secondary | ICD-10-CM | POA: Diagnosis not present

## 2012-01-26 DIAGNOSIS — I059 Rheumatic mitral valve disease, unspecified: Secondary | ICD-10-CM | POA: Diagnosis not present

## 2012-01-26 DIAGNOSIS — E785 Hyperlipidemia, unspecified: Secondary | ICD-10-CM

## 2012-01-26 DIAGNOSIS — I428 Other cardiomyopathies: Secondary | ICD-10-CM

## 2012-01-26 DIAGNOSIS — R0609 Other forms of dyspnea: Secondary | ICD-10-CM | POA: Diagnosis not present

## 2012-01-26 DIAGNOSIS — Z7901 Long term (current) use of anticoagulants: Secondary | ICD-10-CM | POA: Diagnosis not present

## 2012-01-26 DIAGNOSIS — I420 Dilated cardiomyopathy: Secondary | ICD-10-CM

## 2012-01-26 DIAGNOSIS — I4891 Unspecified atrial fibrillation: Secondary | ICD-10-CM | POA: Diagnosis not present

## 2012-01-26 DIAGNOSIS — R0989 Other specified symptoms and signs involving the circulatory and respiratory systems: Secondary | ICD-10-CM | POA: Diagnosis not present

## 2012-01-26 LAB — POCT INR: INR: 3.3

## 2012-01-26 NOTE — Assessment & Plan Note (Signed)
Status post aortic valve replacement. Continue SBE prophylaxis. 

## 2012-01-26 NOTE — Assessment & Plan Note (Signed)
Patient remains in sinus rhythm. Continue Toprol, flecainide and Coumadin. She is being evaluated for vaginal bleeding. Unclear if this is bladder origin or other. I agree with continued evaluation. Coumadin could be contributing but need to rule out other pathologic source.

## 2012-01-26 NOTE — Progress Notes (Signed)
HPI: Pleasant female with history of severe AS, mitral regurgitation, aneurysm of the ascending thoracic aorta (status post aortic valve replacement, mitral valve repair, resection and grafting of the ascending thoracic aortic aneurysm on Jul 07, 2006). History of paroxysmal atrial fibrillation (status post maze procedure on Jul 07, 2006). Followup catheterization performed secondary to elevated troponin and recurrent atrial fibrillation revealed an ejection fraction of 25-30% and occlusion of the reimplanted left main. The patient subsequently underwent redo coronary artery bypass and graft with a LIMA to the LAD and a saphenous vein graft to the OM. She also had an epicardial lead placed if she required biventricular pacing in the future. She was readmitted in August of 2011 with atrial flutter and underwent cardioversion. Her flecainide was increased to 100 mg p.o. b.i.d. A followup exercise treadmill showed no exercise induced ventricular tachycardia. She was seen by Dr. Rayann Heman. He recommended continuing management with same medications. If atrial fibrillation recurred then we could consider ablation versus tikosyn. A followup echocardiogram was performed in October of 2013. Her ejection fraction was 35-40%. There was mild perivalvular aortic insufficiency. There was mild mitral stenosis and mild residual mitral regurgitation. There was mild to moderate tricuspid regurgitation. Mild biatrial enlargement. MUGA was performed in October of 2013 and showed an ejection fraction of 35%. Since I last saw her, she has some dyspnea on exertion which is unchanged. No orthopnea, PND, pedal edema, syncope. Occasional brief palpitations but no recurrent atrial fibrillation. She is being evaluated for vaginal bleeding.  Current Outpatient Prescriptions  Medication Sig Dispense Refill  . Calcium Carbonate (CALCIUM 600) 1500 MG TABS Take 1 tablet by mouth as needed.       . Cholecalciferol (VITAMIN D3) 50000 UNITS CAPS  Take 1 tablet by mouth once a week.  12 capsule  3  . flecainide (TAMBOCOR) 100 MG tablet TAKE 1 TABLET (100 MG TOTAL) BY MOUTH 2 (TWO) TIMES DAILY.  180 tablet  3  . furosemide (LASIX) 20 MG tablet Take 1 tablet (20 mg total) by mouth daily.  90 tablet  3  . KLOR-CON M20 20 MEQ tablet TAKE 1 TABLET EVERY DAY  90 tablet  3  . metFORMIN (GLUCOPHAGE-XR) 500 MG 24 hr tablet TAKE 4 TABLETS BY MOUTH EVERY EVENING  360 tablet  4  . metoprolol succinate (TOPROL-XL) 100 MG 24 hr tablet TAKE 1 TABLET BY MOUTH EVERY DAY  90 tablet  3  . metoprolol succinate (TOPROL-XL) 50 MG 24 hr tablet TAKE 1 TABLET (50 MG TOTAL) BY MOUTH DAILY. TAKE WITH 100MG  TABLET TO MAKE 150MG   90 tablet  3  . MICARDIS 20 MG tablet TAKE 1/2 TABLET BY MOUTH EVERY DAY  30 tablet  1  . omeprazole (PRILOSEC OTC) 20 MG tablet Take 20 mg by mouth daily.        . pioglitazone (ACTOS) 45 MG tablet TAKE 1 TABLET EVERY DAY  90 tablet  4  . vitamin B-12 (CYANOCOBALAMIN) 250 MCG tablet Take 250 mcg by mouth daily. Take 3 times a week      . warfarin (COUMADIN) 4 MG tablet TAKE AS DIRECTED BY THE ANTICOAGULATION CLINIC  30 tablet  3  . [DISCONTINUED] warfarin (COUMADIN) 2 MG tablet TAKE AS DIRECTED BY COUMADIN CLINIC.  30 tablet  1  . [DISCONTINUED] warfarin (COUMADIN) 4 MG tablet Take as directed by the Anticoagulation Clinic  90 tablet  4     Past Medical History  Diagnosis Date  . Hyperlipidemia   . Anemia   .  Atrial fibrillation   . Diverticulitis   . Valvular heart disease     per HPI  . CVA (cerebral vascular accident)     s/p CVA felt due to oscillating calcium on aortic valve  . Carcinoma in situ of vulva   . Fibroid   . Ovarian cyst, left 2008-2009  . Osteoporosis   . Diabetes mellitus   . Hypertension   . GERD (gastroesophageal reflux disease)     Past Surgical History  Procedure Date  . Foot surgery     removed neuroma  . Aortic root replacement     homograft  . Mv repair     due to severe MR  . Resection and  grafting of ascending thoracic aortic   . Aneurysm and left sided maze 2008  . Coronary artery bypass graft 4/10    redone due to occluded LM at implantation site (LIMA to LAD, SVG to OM)  . Wide excision of vulva     CA INSITU    History   Social History  . Marital Status: Married    Spouse Name: N/A    Number of Children: N/A  . Years of Education: N/A   Occupational History  . Not on file.   Social History Main Topics  . Smoking status: Former Smoker    Quit date: 03/09/1983  . Smokeless tobacco: Not on file  . Alcohol Use: No  . Drug Use: No  . Sexually Active: Yes    Birth Control/ Protection: Post-menopausal   Other Topics Concern  . Not on file   Social History Narrative   No regular exercise    ROS: no fevers or chills, productive cough, hemoptysis, dysphasia, odynophagia, melena, hematochezia, dysuria, rash, seizure activity, orthopnea, PND, pedal edema, claudication. Remaining systems are negative.  Physical Exam: Well-developed well-nourished in no acute distress.  Skin is warm and dry.  HEENT is normal.  Neck is supple.  Chest is clear to auscultation with normal expansion.  Cardiovascular exam is regular rate and rhythm. 2/6 systolic murmur left sternal border. Abdominal exam nontender or distended. No masses palpated. Extremities show no edema. neuro grossly intact  ECG sinus rhythm with PACs. First degree AV block. Nonspecific T-wave changes. IVCD.

## 2012-01-26 NOTE — Assessment & Plan Note (Signed)
Status post mitral valve repair. Continue SBE prophylaxis.

## 2012-01-26 NOTE — Assessment & Plan Note (Signed)
Management per primary care. 

## 2012-01-26 NOTE — Assessment & Plan Note (Signed)
Blood pressure controlled. Continue present medications. 

## 2012-01-26 NOTE — Assessment & Plan Note (Signed)
Continue beta blocker and ARB. LV function is reduced compared to previous. Arrange Myoview to exclude ischemia. May need ICD if ejection fraction less than or equal to 35% on followup study.

## 2012-01-26 NOTE — Patient Instructions (Addendum)
Your physician recommends that you schedule a follow-up appointment in: 3 MONTHS WITH DR CRENSHAW  Your physician has requested that you have a lexiscan myoview. For further information please visit [email or web address]. Please follow instruction sheet, as given.    

## 2012-01-31 ENCOUNTER — Other Ambulatory Visit: Payer: Self-pay | Admitting: Cardiology

## 2012-01-31 ENCOUNTER — Telehealth: Payer: Self-pay | Admitting: Obstetrics and Gynecology

## 2012-01-31 NOTE — Telephone Encounter (Signed)
Dr. Stanford Breed M.D. Told me that althugh Coumadin could cause the bleeding we should look for other reasons. He said she could be off her Coumadin for 4 days in order to do an endometrial biopsy. She will see Dr. Gaynelle Arabian  next week and call me after visit and we will proceed unless  urological visit changes our mine. She will do a tampon test if she sees any bleeding.

## 2012-02-07 ENCOUNTER — Telehealth: Payer: Self-pay | Admitting: Cardiology

## 2012-02-07 NOTE — Telephone Encounter (Signed)
Pt calling re some issues she is having and not sure if she needs to be seen or not, pls call

## 2012-02-07 NOTE — Telephone Encounter (Signed)
Spoke with pt, for the last two weeks she has had a cold. Runny nose and dry, nonproductive cough. She is also tired. This am she woke with back pain, at the level of the bra in the back. The pain is worse when she takes a deep breath. She will occ feel her heart "bump." told pt she can take claritin for the cold symptoms and the back pain sounds muscle related. She is currently using a heating pad in that area and is getting relief. Pt aware can also take tylenol or use muscle rub to help with that area. Pt voiced understanding and will call back if symptoms change or worsen.

## 2012-02-08 ENCOUNTER — Ambulatory Visit (INDEPENDENT_AMBULATORY_CARE_PROVIDER_SITE_OTHER)
Admission: RE | Admit: 2012-02-08 | Discharge: 2012-02-08 | Disposition: A | Discharge status: Home / Self Care | Payer: Medicare Other | Source: Ambulatory Visit | Attending: Internal Medicine | Admitting: Internal Medicine

## 2012-02-08 ENCOUNTER — Encounter: Payer: Self-pay | Admitting: Internal Medicine

## 2012-02-08 ENCOUNTER — Ambulatory Visit (INDEPENDENT_AMBULATORY_CARE_PROVIDER_SITE_OTHER): Payer: Medicare Other | Admitting: Internal Medicine

## 2012-02-08 VITALS — BP 118/74 | HR 81 | Temp 98.5°F | Resp 16

## 2012-02-08 DIAGNOSIS — J209 Acute bronchitis, unspecified: Secondary | ICD-10-CM | POA: Diagnosis not present

## 2012-02-08 DIAGNOSIS — R05 Cough: Secondary | ICD-10-CM | POA: Diagnosis not present

## 2012-02-08 MED ORDER — AZITHROMYCIN 500 MG PO TABS: 500.0000 mg | ORAL_TABLET | Freq: Every day | ORAL | Status: DC

## 2012-02-08 MED ORDER — HYDROCODONE-HOMATROPINE 5-1.5 MG/5ML PO SYRP: 5.0000 mL | ORAL_SOLUTION | Freq: Three times a day (TID) | ORAL | Status: DC | PRN

## 2012-02-08 NOTE — Assessment & Plan Note (Signed)
Start Zpak for the infection and a cough suppressant 

## 2012-02-08 NOTE — Assessment & Plan Note (Signed)
CXR today to see if there is any pneumonia

## 2012-02-08 NOTE — Patient Instructions (Signed)

## 2012-02-08 NOTE — Progress Notes (Signed)
Subjective:    Patient ID: [name], female    DOB: August 07, 1940, 71 y.o.   MRN: [id number]  Cough This is a new problem. Episode onset: 2 weeks. The problem has been unchanged. The cough is productive of purulent sputum. Associated symptoms include chest pain (sharp pain in her back when she coughs), chills and a sore throat. Pertinent negatives include no ear congestion, ear pain, fever, headaches, heartburn, hemoptysis, myalgias, nasal congestion, postnasal drip, rash, rhinorrhea, shortness of breath, sweats, weight loss or wheezing. She has tried nothing for the symptoms. The treatment provided no relief.      Review of Systems  Constitutional: Positive for chills. Negative for fever, weight loss, diaphoresis, activity change, appetite change, fatigue and unexpected weight change.  HENT: Positive for sore throat. Negative for ear pain, nosebleeds, facial swelling, rhinorrhea, sneezing, trouble swallowing, neck pain, neck stiffness, dental problem, voice change, postnasal drip, sinus pressure and ear discharge.   Eyes: Negative.   Respiratory: Positive for cough. Negative for apnea, hemoptysis, choking, chest tightness, shortness of breath, wheezing and stridor.   Cardiovascular: Positive for chest pain (sharp pain in her back when she coughs). Negative for palpitations and leg swelling.  Gastrointestinal: Negative.  Negative for heartburn.  Genitourinary: Negative.   Musculoskeletal: Negative.  Negative for myalgias.  Skin: Negative for color change, pallor, rash and wound.  Neurological: Negative.  Negative for headaches.  Hematological: Negative for adenopathy. Does not bruise/bleed easily.  Psychiatric/Behavioral: Negative.        Objective:   Physical Exam  Vitals reviewed. Constitutional: She is oriented to person, place, and time. She appears well-developed and well-nourished.  Non-toxic appearance. She does not have a sickly appearance. She does not appear ill. No distress.   HENT:  Head: Normocephalic and atraumatic.  Mouth/Throat: Oropharynx is clear and moist. No oropharyngeal exudate.  Eyes: Conjunctivae normal are normal. Right eye exhibits no discharge. Left eye exhibits no discharge. No scleral icterus.  Neck: Normal range of motion. Neck supple. No JVD present. No tracheal deviation present. No thyromegaly present.  Cardiovascular: Normal rate, regular rhythm, normal heart sounds and intact distal pulses.  Exam reveals no gallop and no friction rub.   No murmur heard. Pulmonary/Chest: Effort normal and breath sounds normal. No stridor. No respiratory distress. She has no wheezes. She has no rales. She exhibits no tenderness.  Abdominal: Soft. Bowel sounds are normal. She exhibits no distension and no mass. There is no tenderness. There is no rebound and no guarding.  Musculoskeletal: Normal range of motion. She exhibits no edema and no tenderness.  Lymphadenopathy:    She has no cervical adenopathy.  Neurological: She is oriented to person, place, and time.  Skin: Skin is warm and dry. No rash noted. She is not diaphoretic. No erythema. No pallor.  Psychiatric: She has a normal mood and affect. Her behavior is normal. Judgment and thought content normal.      Lab Results  Component Value Date   WBC 7.5 12/27/2011   HGB 9.9* 12/27/2011   HCT 31.2* 12/27/2011   PLT 367.0 12/27/2011   GLUCOSE 103* 12/27/2011   CHOL 200 04/08/2011   TRIG 110.0 04/08/2011   HDL 62.70 04/08/2011   LDLDIRECT 132.3 10/17/2008   LDLCALC 115* 04/08/2011   ALT 11 12/27/2011   AST 27 12/27/2011   NA 138 12/27/2011   K 5.0 12/27/2011   CL 106 12/27/2011   CREATININE 1.0 12/27/2011   BUN 17 12/27/2011   CO2 24 12/27/2011  TSH 1.21 08/11/2011   INR 3.3 01/26/2012   HGBA1C 6.2 12/27/2011   MICROALBUR 1.1 10/17/2008      Assessment & Plan:

## 2012-02-09 DIAGNOSIS — R310 Gross hematuria: Secondary | ICD-10-CM | POA: Diagnosis not present

## 2012-02-10 ENCOUNTER — Telehealth: Payer: Self-pay | Admitting: Cardiology

## 2012-02-10 NOTE — Telephone Encounter (Signed)
Spoke with pt, she was seen by dr Ronnald Ramp and she has bronchitis. She was given a z-pak. She did have an cxr that showed some CHF and she is wondering if anything needs to changed due to the cxr. She is scheduled for lexiscan on Monday and wonders if she should cancel. She is SOB, she sleeps on a wedge on her side and has had no problems breathing while sleeping. Will forward for dr Stanford Breed review

## 2012-02-10 NOTE — Telephone Encounter (Signed)
New Problem:    PAtient has fallen ill and had a few questions regarding her stress test on Monday.  Please call back.

## 2012-02-10 NOTE — Telephone Encounter (Signed)
Spoke with pt, Aware of dr crenshaw's recommendations.  °

## 2012-02-10 NOTE — Telephone Encounter (Signed)
Additional 20 mg lasix po daily PRN increased Dyspnea [name]

## 2012-02-14 ENCOUNTER — Encounter (HOSPITAL_COMMUNITY): Payer: Medicare Other

## 2012-02-15 ENCOUNTER — Telehealth: Payer: Self-pay | Admitting: Obstetrics and Gynecology

## 2012-02-15 NOTE — Telephone Encounter (Signed)
I received this info from Dr. Darnell Level in staff message "I need to do an endometrial biopsy on patient and she needs to stop coumadin for 4 days before the biopsy. I am not sure when she is scheduled for cystoscopy but if it is within one week we could wait and see if anything is found that would require surgery so it could be coordinated. However regardless of the findings I need to do biopsy so patient should be called and set up. Explain the above to her."  I called patient and spoke with her. She said she is very sick presently with acute bronchitis.  She was scheduled for nuclear med stress test Monday but they are rescheduling that because of her coughing episodes. She said the CT Dr. Gaynelle Arabian ordered is early Jan and the cystoscopy is later in January.  She said also she is having trouble getting around because of her back pain from being inactive.  She said right now she is in a difficult time physically.  She understands need for endo biopsy and that she would need to d/c her Coumadin for 4 days prior.  She wanted me to see what Dr. Darnell Level recommended in light of her current sickness.

## 2012-02-15 NOTE — Telephone Encounter (Signed)
I spoke with patient and she understood.  She said her CT is Jan 6 and she will have Dr. Darene Lamer send a copy here and she will check in with Dr. TF/JF to see what they recommend regarding endo bx then.  She said she was grateful to Dr. Darnell Level for following all this and taking such good care of her.

## 2012-02-15 NOTE — Telephone Encounter (Signed)
I think she should wait on endometrial biopsy. He should probably be done in early January after CT. Dr. Gaynelle Arabian needs to send a copy to Dr. Eppie Gibson and then let them decide regarding biopsy. She should not let this go past January.

## 2012-02-21 ENCOUNTER — Encounter: Payer: Self-pay | Admitting: Internal Medicine

## 2012-02-21 ENCOUNTER — Ambulatory Visit (INDEPENDENT_AMBULATORY_CARE_PROVIDER_SITE_OTHER): Payer: Medicare Other | Admitting: Internal Medicine

## 2012-02-21 ENCOUNTER — Ambulatory Visit (INDEPENDENT_AMBULATORY_CARE_PROVIDER_SITE_OTHER)
Admission: RE | Admit: 2012-02-21 | Discharge: 2012-02-21 | Disposition: A | Discharge status: Home / Self Care | Payer: Medicare Other | Source: Ambulatory Visit | Attending: Internal Medicine | Admitting: Internal Medicine

## 2012-02-21 VITALS — BP 130/82 | HR 80 | Temp 97.3°F | Resp 16 | Wt 173.0 lb

## 2012-02-21 DIAGNOSIS — M818 Other osteoporosis without current pathological fracture: Secondary | ICD-10-CM

## 2012-02-21 DIAGNOSIS — M546 Pain in thoracic spine: Secondary | ICD-10-CM | POA: Diagnosis not present

## 2012-02-21 DIAGNOSIS — M949 Disorder of cartilage, unspecified: Secondary | ICD-10-CM | POA: Diagnosis not present

## 2012-02-21 NOTE — Assessment & Plan Note (Signed)
I will check a plain film to see if she has ddd, spurs, occult fracture She does not want anything for pain

## 2012-02-21 NOTE — Patient Instructions (Signed)
Back Pain, Adult Low back pain is very common. About 1 in 5 people have back pain.The cause of low back pain is rarely dangerous. The pain often gets better over time.About half of people with a sudden onset of back pain feel better in just 2 weeks. About 8 in 10 people feel better by 6 weeks.  CAUSES Some common causes of back pain include:  Strain of the muscles or ligaments supporting the spine.  Wear and tear (degeneration) of the spinal discs.  Arthritis.  Direct injury to the back. DIAGNOSIS Most of the time, the direct cause of low back pain is not known.However, back pain can be treated effectively even when the exact cause of the pain is unknown.Answering your caregiver's questions about your overall health and symptoms is one of the most accurate ways to make sure the cause of your pain is not dangerous. If your caregiver needs more information, he or she may order lab work or imaging tests (X-rays or MRIs).However, even if imaging tests show changes in your back, this usually does not require surgery. HOME CARE INSTRUCTIONS For many people, back pain returns.Since low back pain is rarely dangerous, it is often a condition that people can learn to manageon their own.   Remain active. It is stressful on the back to sit or stand in one place. Do not sit, drive, or stand in one place for more than 30 minutes at a time. Take short walks on level surfaces as soon as pain allows.Try to increase the length of time you walk each day.  Do not stay in bed.Resting more than 1 or 2 days can delay your recovery.  Do not avoid exercise or work.Your body is made to move.It is not dangerous to be active, even though your back may hurt.Your back will likely heal faster if you return to being active before your pain is gone.  Pay attention to your body when you bend and lift. Many people have less discomfortwhen lifting if they bend their knees, keep the load close to their bodies,and  avoid twisting. Often, the most comfortable positions are those that put less stress on your recovering back.  Find a comfortable position to sleep. Use a firm mattress and lie on your side with your knees slightly bent. If you lie on your back, put a pillow under your knees.  Only take over-the-counter or prescription medicines as directed by your caregiver. Over-the-counter medicines to reduce pain and inflammation are often the most helpful.Your caregiver may prescribe muscle relaxant drugs.These medicines help dull your pain so you can more quickly return to your normal activities and healthy exercise.  Put ice on the injured area.  Put ice in a plastic bag.  Place a towel between your skin and the bag.  Leave the ice on for 15 to 20 minutes, 3 to 4 times a day for the first 2 to 3 days. After that, ice and heat may be alternated to reduce pain and spasms.  Ask your caregiver about trying back exercises and gentle massage. This may be of some benefit.  Avoid feeling anxious or stressed.Stress increases muscle tension and can worsen back pain.It is important to recognize when you are anxious or stressed and learn ways to manage it.Exercise is a great option. SEEK MEDICAL CARE IF:  You have pain that is not relieved with rest or medicine.  You have pain that does not improve in 1 week.  You have new symptoms.  You are generally   not feeling well. SEEK IMMEDIATE MEDICAL CARE IF:   You have pain that radiates from your back into your legs.  You develop new bowel or bladder control problems.  You have unusual weakness or numbness in your arms or legs.  You develop nausea or vomiting.  You develop abdominal pain.  You feel faint. Document Released: 02/22/2005 Document Revised: 08/24/2011 Document Reviewed: 07/13/2010 [employer] Patient Information 2013 [employer].  

## 2012-02-21 NOTE — Addendum Note (Signed)
Addended by: Janith Lima on: 02/21/2012 05:16 PM   Modules accepted: Orders

## 2012-02-21 NOTE — Progress Notes (Signed)
Subjective:    Patient ID: [name], female    DOB: Jun 05, 1940, 71 y.o.   MRN: [id number]  Back Pain This is a recurrent problem. The current episode started more than 1 month ago. The problem occurs intermittently. The problem has been gradually worsening since onset. The pain is present in the thoracic spine. The quality of the pain is described as aching and stabbing. The pain does not radiate. The pain is at a severity of 2/10. The pain is mild. The pain is worse during the night. The symptoms are aggravated by position and bending. Pertinent negatives include no abdominal pain, bladder incontinence, bowel incontinence, chest pain, dysuria, fever, headaches, leg pain, numbness, paresis, paresthesias, pelvic pain, perianal numbness, tingling, weakness or weight loss. Risk factors include obesity, lack of exercise and history of osteoporosis. Treatments tried: tylenol. The treatment provided moderate relief.      Review of Systems  Constitutional: Negative for fever, chills, weight loss, diaphoresis, activity change, appetite change, fatigue and unexpected weight change.  HENT: Negative.   Eyes: Negative.   Respiratory: Negative for cough, chest tightness, shortness of breath, wheezing and stridor.   Cardiovascular: Negative for chest pain, palpitations and leg swelling.  Gastrointestinal: Negative for nausea, vomiting, abdominal pain, diarrhea, constipation, blood in stool and bowel incontinence.  Genitourinary: Negative.  Negative for bladder incontinence, dysuria and pelvic pain.  Musculoskeletal: Positive for back pain. Negative for myalgias, joint swelling, arthralgias and gait problem.  Skin: Negative for color change, pallor, rash and wound.  Neurological: Negative for dizziness, tingling, tremors, seizures, syncope, facial asymmetry, speech difficulty, weakness, light-headedness, numbness, headaches and paresthesias.  Hematological: Negative for adenopathy. Does not bruise/bleed  easily.  Psychiatric/Behavioral: Negative.        Objective:   Physical Exam  Vitals reviewed. Constitutional: She is oriented to person, place, and time. She appears well-developed and well-nourished.  Non-toxic appearance. She does not have a sickly appearance. She does not appear ill. No distress.  HENT:  Head: Normocephalic and atraumatic.  Mouth/Throat: Oropharynx is clear and moist. No oropharyngeal exudate.  Eyes: Conjunctivae normal are normal. Right eye exhibits no discharge. Left eye exhibits no discharge. No scleral icterus.  Neck: Normal range of motion. Neck supple. No JVD present. No tracheal deviation present. No thyromegaly present.  Cardiovascular: Normal rate, regular rhythm, normal heart sounds and intact distal pulses.  Exam reveals no gallop and no friction rub.   No murmur heard. Pulmonary/Chest: Effort normal and breath sounds normal. No stridor. No respiratory distress. She has no wheezes. She has no rales. She exhibits no tenderness.  Abdominal: Soft. Bowel sounds are normal. She exhibits no distension and no mass. There is no tenderness. There is no rebound and no guarding.  Musculoskeletal: Normal range of motion. She exhibits no edema and no tenderness.       Thoracic back: She exhibits tenderness and bony tenderness. She exhibits normal range of motion, no swelling, no edema, no deformity, no laceration, no pain, no spasm and normal pulse.       Back:  Lymphadenopathy:    She has no cervical adenopathy.  Neurological: She is alert and oriented to person, place, and time. She has normal reflexes. She displays normal reflexes. No cranial nerve deficit. She exhibits normal muscle tone. Coordination normal.  Skin: Skin is warm and dry. No rash noted. She is not diaphoretic. No erythema. No pallor.  Psychiatric: She has a normal mood and affect. Her behavior is normal. Judgment and thought content normal.  Lab Results  Component Value Date   WBC 7.5  12/27/2011   HGB 9.9* 12/27/2011   HCT 31.2* 12/27/2011   PLT 367.0 12/27/2011   GLUCOSE 103* 12/27/2011   CHOL 200 04/08/2011   TRIG 110.0 04/08/2011   HDL 62.70 04/08/2011   LDLDIRECT 132.3 10/17/2008   LDLCALC 115* 04/08/2011   ALT 11 12/27/2011   AST 27 12/27/2011   NA 138 12/27/2011   K 5.0 12/27/2011   CL 106 12/27/2011   CREATININE 1.0 12/27/2011   BUN 17 12/27/2011   CO2 24 12/27/2011   TSH 1.21 08/11/2011   INR 3.3 01/26/2012   HGBA1C 6.2 12/27/2011   MICROALBUR 1.1 10/17/2008       Assessment & Plan:

## 2012-02-22 ENCOUNTER — Ambulatory Visit (INDEPENDENT_AMBULATORY_CARE_PROVIDER_SITE_OTHER): Payer: Medicare Other | Admitting: *Deleted

## 2012-02-22 ENCOUNTER — Telehealth: Payer: Self-pay | Admitting: *Deleted

## 2012-02-22 DIAGNOSIS — I059 Rheumatic mitral valve disease, unspecified: Secondary | ICD-10-CM | POA: Diagnosis not present

## 2012-02-22 DIAGNOSIS — I359 Nonrheumatic aortic valve disorder, unspecified: Secondary | ICD-10-CM

## 2012-02-22 DIAGNOSIS — Z7901 Long term (current) use of anticoagulants: Secondary | ICD-10-CM | POA: Diagnosis not present

## 2012-02-22 DIAGNOSIS — I4891 Unspecified atrial fibrillation: Secondary | ICD-10-CM

## 2012-02-22 LAB — PROTIME-INR
INR: 7 ratio (ref 0.8–1.0)
Prothrombin Time: 71 s (ref 10.2–12.4)

## 2012-02-22 NOTE — Telephone Encounter (Signed)
Would follow recommendations of primary care [name]

## 2012-02-22 NOTE — Telephone Encounter (Signed)
Pt here for CVRR check and ask to speak to me. She was seen by PCP and xray shows she has a fracture in her back. She would like dr Stanford Breed to look at report and if he could suggest ortho to see or what she should do next. Will forward for dr Stanford Breed review

## 2012-02-23 ENCOUNTER — Telehealth: Payer: Self-pay | Admitting: *Deleted

## 2012-02-23 NOTE — Telephone Encounter (Signed)
Informed pt to continue to hold coumadin until INR ck 02/25/12.

## 2012-02-23 NOTE — Patient Instructions (Signed)
Talked with pt today and instructed to hold coumadin until 12/20, continue anticoagulation safety

## 2012-02-23 NOTE — Telephone Encounter (Signed)
Spoke with pt, Aware of dr crenshaw's recommendations.  °

## 2012-02-24 ENCOUNTER — Ambulatory Visit (HOSPITAL_COMMUNITY): Payer: Medicare Other | Attending: Cardiology | Admitting: Radiology

## 2012-02-24 VITALS — BP 116/62 | Ht 66.0 in | Wt 170.0 lb

## 2012-02-24 DIAGNOSIS — I739 Peripheral vascular disease, unspecified: Secondary | ICD-10-CM | POA: Diagnosis not present

## 2012-02-24 DIAGNOSIS — I2510 Atherosclerotic heart disease of native coronary artery without angina pectoris: Secondary | ICD-10-CM

## 2012-02-24 DIAGNOSIS — R0609 Other forms of dyspnea: Secondary | ICD-10-CM | POA: Insufficient documentation

## 2012-02-24 DIAGNOSIS — I4891 Unspecified atrial fibrillation: Secondary | ICD-10-CM | POA: Diagnosis not present

## 2012-02-24 DIAGNOSIS — R002 Palpitations: Secondary | ICD-10-CM | POA: Diagnosis not present

## 2012-02-24 DIAGNOSIS — R0989 Other specified symptoms and signs involving the circulatory and respiratory systems: Secondary | ICD-10-CM | POA: Insufficient documentation

## 2012-02-24 DIAGNOSIS — E119 Type 2 diabetes mellitus without complications: Secondary | ICD-10-CM | POA: Diagnosis not present

## 2012-02-24 DIAGNOSIS — I10 Essential (primary) hypertension: Secondary | ICD-10-CM | POA: Insufficient documentation

## 2012-02-24 MED ORDER — TECHNETIUM TC 99M SESTAMIBI GENERIC - CARDIOLITE
33.0000 | Freq: Once | INTRAVENOUS | Status: AC | PRN
  Administered 2012-02-24: 33 via INTRAVENOUS

## 2012-02-24 MED ORDER — REGADENOSON 0.4 MG/5ML IV SOLN
0.4000 mg | Freq: Once | INTRAVENOUS | Status: AC
  Administered 2012-02-24: 0.4 mg via INTRAVENOUS

## 2012-02-24 MED ORDER — TECHNETIUM TC 99M SESTAMIBI GENERIC - CARDIOLITE
11.0000 | Freq: Once | INTRAVENOUS | Status: AC | PRN
  Administered 2012-02-24: 11 via INTRAVENOUS

## 2012-02-24 NOTE — Progress Notes (Signed)
[facility] [address] [place], [place] [address] [phone]    Cardiology Nuclear Med Study  [name] is a 71 y.o. female     MRN : [id number]     DOB: 1940-03-20  Procedure Date: 02/24/2012  Nuclear Med Background Indication for Stress Test:  Evaluation for Ischemia, Graft Patency, Follow-up CAD and assess EF for potential ICD History:  AFIB, 06/2008 MI-Heart Cath (L) main EF: 25-30% CABGx2 redo, 10/2009 Cardioversion, 11/2009 GXT: (-) ischemia or induced VTACH 12/31/11 ECHO: EF: 35-40%, AS  Cardiac Risk Factors: CVA, History of Smoking, Hypertension, Lipids, NIDDM and PVD  Symptoms:  DOE, Palpitations and SOB   Nuclear Pre-Procedure Caffeine/Decaff Intake:  None > 12 hrs NPO After: 8:00pm   Lungs:  clear O2 Sat: 96% on room air. IV 0.9% NS with Angio Cath:  22g  IV Site: R Hand x 1, tolerated well IV Started by:  Irven Baltimore, RN  Chest Size (in):  36 Cup Size: C  Height: 5\' 6"  (1.676 m)  Weight:  170 lb (77.111 kg)  BMI:  Body mass index is 27.44 kg/(m^2). Tech Comments:  Took Toprol, Flecainide, and Micardis this am. Held Metformin and Actos this am. FBS was 190 at 6:50am.    Nuclear Med Study 1 or 2 day study: 1 day  Stress Test Type:  Carlton Adam  Reading MD: [name], MD  Order Authorizing Provider:  [name], MD  Resting Radionuclide: Technetium 72m Sestamibi  Resting Radionuclide Dose: 11.0 mCi   Stress Radionuclide:  Technetium 74m Sestamibi  Stress Radionuclide Dose: 33.0 mCi           Stress Protocol Rest HR: 62 Stress HR: 89  Rest BP: 116/62 Stress BP: 126/74  Exercise Time (min): n/a METS: n/a   Predicted Max HR: 150 bpm % Max HR: 41.33 bpm Rate Pressure Product: 7812    Dose of Adenosine (mg):  n/a Dose of Lexiscan: 0.4 mg  Dose of Atropine (mg): n/a Dose of Dobutamine: n/a mcg/kg/min (at max HR)  Stress Test Technologist: Perrin Maltese, EMT-P  Nuclear Technologist:  Charlton Amor, CNMT     Rest  Procedure:  Myocardial perfusion imaging was performed at rest 45 minutes following the intravenous administration of Technetium 20m Sestamibi. Rest ECG: NSR-LBBB  Stress Procedure:  The patient received IV Lexiscan 0.4 mg over 15-seconds.  Technetium 38m Sestamibi injected at 30-seconds.  Quantitative spect images were obtained after a 45 minute delay. The patient had SOB, freq pacs and a rare pvc Stress ECG: Uninteretable due to baseline LBBB  QPS Raw Data Images:  Acquisition technically good; LVE. Stress Images:  There is decreased uptake in the anterior and inferior wall. Rest Images:  There is decreased uptake in the anterior and inferior wall; anterior defect less prominent towards the apex. Subtraction (SDS):  These findings are consistent with inferior thinning and prior anterior infarct and mild peri-infarct ischemia. Transient Ischemic Dilatation (Normal <1.22):  0.92 Lung/Heart Ratio (Normal <0.45):  0.26  Quantitative Gated Spect Images QGS EDV:  169 ml QGS ESV:  113 ml  Impression Exercise Capacity:  Lexiscan with no exercise. BP Response:  Normal blood pressure response. Clinical Symptoms:  There is dyspnea. ECG Impression:  Baseline:  LBBB.  EKG uninterpretable due to LBBB at rest and stress. Comparison with Prior Nuclear Study: No images to compare  Overall Impression:  High risk stress nuclear study with a fixed, moderate size, medium intensity inferior defect consistent with thinning;  moderate size, medium intensity, partially reversible anterior defect consistent with prior infarct and mild peri-infarct ischemia.  LV Ejection Fraction: 33%.  LV Wall Motion:  Global hypokinesis with anteroseptal akinesis.   [name]

## 2012-02-25 ENCOUNTER — Ambulatory Visit (INDEPENDENT_AMBULATORY_CARE_PROVIDER_SITE_OTHER): Payer: Medicare Other | Admitting: *Deleted

## 2012-02-25 DIAGNOSIS — Z7901 Long term (current) use of anticoagulants: Secondary | ICD-10-CM

## 2012-02-25 DIAGNOSIS — M810 Age-related osteoporosis without current pathological fracture: Secondary | ICD-10-CM | POA: Diagnosis not present

## 2012-02-25 DIAGNOSIS — I359 Nonrheumatic aortic valve disorder, unspecified: Secondary | ICD-10-CM

## 2012-02-25 DIAGNOSIS — I059 Rheumatic mitral valve disease, unspecified: Secondary | ICD-10-CM | POA: Diagnosis not present

## 2012-02-25 DIAGNOSIS — S22009A Unspecified fracture of unspecified thoracic vertebra, initial encounter for closed fracture: Secondary | ICD-10-CM | POA: Diagnosis not present

## 2012-02-25 DIAGNOSIS — I4891 Unspecified atrial fibrillation: Secondary | ICD-10-CM

## 2012-02-25 LAB — POCT INR: INR: 1.7

## 2012-02-28 DIAGNOSIS — E559 Vitamin D deficiency, unspecified: Secondary | ICD-10-CM | POA: Diagnosis not present

## 2012-02-28 DIAGNOSIS — R5381 Other malaise: Secondary | ICD-10-CM | POA: Diagnosis not present

## 2012-02-28 DIAGNOSIS — M810 Age-related osteoporosis without current pathological fracture: Secondary | ICD-10-CM | POA: Diagnosis not present

## 2012-02-29 ENCOUNTER — Other Ambulatory Visit: Payer: Self-pay | Admitting: Cardiology

## 2012-03-02 ENCOUNTER — Ambulatory Visit (INDEPENDENT_AMBULATORY_CARE_PROVIDER_SITE_OTHER): Payer: Medicare Other | Admitting: *Deleted

## 2012-03-02 DIAGNOSIS — Z7901 Long term (current) use of anticoagulants: Secondary | ICD-10-CM | POA: Diagnosis not present

## 2012-03-02 DIAGNOSIS — I359 Nonrheumatic aortic valve disorder, unspecified: Secondary | ICD-10-CM | POA: Diagnosis not present

## 2012-03-02 DIAGNOSIS — I059 Rheumatic mitral valve disease, unspecified: Secondary | ICD-10-CM | POA: Diagnosis not present

## 2012-03-02 DIAGNOSIS — M810 Age-related osteoporosis without current pathological fracture: Secondary | ICD-10-CM | POA: Diagnosis not present

## 2012-03-02 DIAGNOSIS — I4891 Unspecified atrial fibrillation: Secondary | ICD-10-CM | POA: Diagnosis not present

## 2012-03-02 LAB — POCT INR: INR: 3.5

## 2012-03-06 ENCOUNTER — Encounter: Payer: Self-pay | Admitting: Obstetrics and Gynecology

## 2012-03-13 ENCOUNTER — Telehealth: Payer: Self-pay | Admitting: Internal Medicine

## 2012-03-13 DIAGNOSIS — D259 Leiomyoma of uterus, unspecified: Secondary | ICD-10-CM | POA: Diagnosis not present

## 2012-03-13 DIAGNOSIS — N200 Calculus of kidney: Secondary | ICD-10-CM | POA: Diagnosis not present

## 2012-03-13 DIAGNOSIS — R319 Hematuria, unspecified: Secondary | ICD-10-CM | POA: Diagnosis not present

## 2012-03-13 DIAGNOSIS — R310 Gross hematuria: Secondary | ICD-10-CM | POA: Diagnosis not present

## 2012-03-13 NOTE — Telephone Encounter (Signed)
Patient Information:  Caller Name: Tobe  Phone: [phone]  Patient: [name], [name]  Gender: Female  DOB: 07-Dec-1940  Age: 72 Years  PCP: Scarlette Calico (Adults only)  Office Follow Up:  Does the office need to follow up with this patient?: No  Instructions For The Office: N/A  RN Note:  Had CT scan of liver/kidney with contrast 03/13/12 but symptoms present before procedure.  History of UTI X 2.  Blood in urine intermittently for 4-5 months; Saw gyn in 01/24/12 and urologist 02/09/12 regarding hematuria. On Coumdin. No appointments remain for 03/13/12; agreed to appointment for 1000 03/14/12 with Dr Jenny Reichmann.   Symptoms  Reason For Call & Symptoms: Urinary urgency and frequency; seeing Urologist for history of blood in urine  Reviewed Health History In EMR: Yes  Reviewed Medications In EMR: Yes  Reviewed Allergies In EMR: Yes  Reviewed Surgeries / Procedures: Yes  Date of Onset of Symptoms: 03/13/2012  Guideline(s) Used:  Urination Pain - Female  Disposition Per Guideline:   See Today in Office  Reason For Disposition Reached:   Age > 50 years  Advice Given:  N/A  Appointment Scheduled:  03/14/2012 10:15:00 Appointment Scheduled Provider:  Cathlean Cower (Adults only)

## 2012-03-14 ENCOUNTER — Other Ambulatory Visit: Payer: Self-pay | Admitting: Internal Medicine

## 2012-03-14 ENCOUNTER — Encounter: Payer: Self-pay | Admitting: Internal Medicine

## 2012-03-14 ENCOUNTER — Ambulatory Visit (INDEPENDENT_AMBULATORY_CARE_PROVIDER_SITE_OTHER): Payer: Medicare Other | Admitting: Internal Medicine

## 2012-03-14 ENCOUNTER — Other Ambulatory Visit (INDEPENDENT_AMBULATORY_CARE_PROVIDER_SITE_OTHER): Payer: Medicare Other

## 2012-03-14 VITALS — BP 118/76 | HR 76 | Temp 97.5°F | Ht 63.5 in | Wt 173.0 lb

## 2012-03-14 DIAGNOSIS — R319 Hematuria, unspecified: Secondary | ICD-10-CM

## 2012-03-14 DIAGNOSIS — R350 Frequency of micturition: Secondary | ICD-10-CM | POA: Diagnosis not present

## 2012-03-14 DIAGNOSIS — R3915 Urgency of urination: Secondary | ICD-10-CM

## 2012-03-14 DIAGNOSIS — I10 Essential (primary) hypertension: Secondary | ICD-10-CM | POA: Diagnosis not present

## 2012-03-14 LAB — POCT URINALYSIS DIPSTICK
Glucose, UA: NEGATIVE
Ketones, UA: NEGATIVE
Spec Grav, UA: 1.01
Urobilinogen, UA: NEGATIVE

## 2012-03-14 LAB — URINALYSIS, ROUTINE W REFLEX MICROSCOPIC
Bilirubin Urine: NEGATIVE
Ketones, ur: NEGATIVE
Leukocytes, UA: NEGATIVE
Urine Glucose: NEGATIVE
pH: 6 (ref 5.0–8.0)

## 2012-03-14 NOTE — Assessment & Plan Note (Signed)
Unclear etiology, symptoms transient now improved,, - ? Bladder spasm/OAB type - for UA and culure, hold antibx - only tx for abnormal cx given benign exam, should f/u with urology as planned

## 2012-03-14 NOTE — Progress Notes (Signed)
Subjective:    Patient ID: [name], female    DOB: Jul 24, 1940, 72 y.o.   MRN: [id number]  HPI  Here with ? Of UTI, did experience signficant urinary freq and urgency on awakening yest am, seemed much less by the end of the day and none this AM, has not been tx for UTI for several yrs but thinks might be recurred?   Denies urinary symptoms such as dysuria,or hematuria, and no abd pain, n/v, fever, chills, or worsening back pain on top of the chronic related to bone pain per pt.  Has seen urology as recently as dec 2013 for hematuria.  No gross hematuria with this episode, hx of stones, GU malignancy, or OAB.   Past Medical History  Diagnosis Date  . Hyperlipidemia   . Anemia   . Atrial fibrillation   . Diverticulitis   . Valvular heart disease     per HPI  . CVA (cerebral vascular accident)     s/p CVA felt due to oscillating calcium on aortic valve  . Carcinoma in situ of vulva   . Fibroid   . Ovarian cyst, left 2008-2009  . Osteoporosis   . Diabetes mellitus   . Hypertension   . GERD (gastroesophageal reflux disease)    Past Surgical History  Procedure Date  . Foot surgery     removed neuroma  . Aortic root replacement     homograft  . Mv repair     due to severe MR  . Resection and grafting of ascending thoracic aortic   . Aneurysm and left sided maze 2008  . Coronary artery bypass graft 4/10    redone due to occluded LM at implantation site (LIMA to LAD, SVG to OM)  . Wide excision of vulva     CA INSITU    reports that she quit smoking about 29 years ago. She has never used smokeless tobacco. She reports that she does not drink alcohol or use illicit drugs. family history includes Breast cancer in her maternal aunt; Cancer in her paternal grandmother; and Hypertension in her mother. No Known Allergies Current Outpatient Prescriptions on File Prior to Visit  Medication Sig Dispense Refill  . Calcium Carbonate (CALCIUM 600) 1500 MG TABS Take 1 tablet by mouth as  needed.       . Cholecalciferol (VITAMIN D3) 50000 UNITS CAPS Take 1 tablet by mouth once a week.  12 capsule  3  . flecainide (TAMBOCOR) 100 MG tablet TAKE 1 TABLET (100 MG TOTAL) BY MOUTH 2 (TWO) TIMES DAILY.  180 tablet  3  . furosemide (LASIX) 20 MG tablet TAKE 1 TABLET (20 MG TOTAL) BY MOUTH DAILY.  90 tablet  3  . KLOR-CON M20 20 MEQ tablet TAKE 1 TABLET EVERY DAY  90 tablet  3  . metFORMIN (GLUCOPHAGE-XR) 500 MG 24 hr tablet TAKE 4 TABLETS BY MOUTH EVERY EVENING  360 tablet  4  . metoprolol succinate (TOPROL-XL) 100 MG 24 hr tablet TAKE 1 TABLET BY MOUTH EVERY DAY  90 tablet  3  . metoprolol succinate (TOPROL-XL) 50 MG 24 hr tablet TAKE 1 TABLET (50 MG TOTAL) BY MOUTH DAILY. TAKE WITH 100MG  TABLET TO MAKE 150MG   90 tablet  3  . MICARDIS 20 MG tablet TAKE 1/2 TABLET BY MOUTH EVERY DAY  30 tablet  1  . omeprazole (PRILOSEC OTC) 20 MG tablet Take 20 mg by mouth daily.        . pioglitazone (ACTOS) 45 MG tablet  TAKE 1 TABLET EVERY DAY  90 tablet  4  . vitamin B-12 (CYANOCOBALAMIN) 250 MCG tablet Take 250 mcg by mouth daily. Take 3 times a week      . warfarin (COUMADIN) 2 MG tablet TAKE AS DIRECTED BY COUMADIN CLINIC.  30 tablet  1  . warfarin (COUMADIN) 4 MG tablet TAKE AS DIRECTED BY THE ANTICOAGULATION CLINIC  30 tablet  3   Review of Systems  Constitutional: Negative for diaphoresis and unexpected weight change.  HENT: Negative for tinnitus.   Eyes: Negative for photophobia and visual disturbance.  Respiratory: Negative for choking and stridor.   Gastrointestinal: Negative for vomiting and blood in stool.  Genitourinary: Negative for hematuria and decreased urine volume.  Musculoskeletal: Negative for gait problem.  Skin: Negative for color change and wound.  Neurological: Negative for tremors and numbness.  Psychiatric/Behavioral: Negative for decreased concentration. The patient is not hyperactive.       Objective:   Physical Exam BP 118/76  Pulse 76  Temp 97.5 F (36.4 C)  (Oral)  Ht 5' 3.5" (1.613 m)  Wt 173 lb (78.472 kg)  BMI 30.16 kg/m2  SpO2 96% Physical Exam  VS noted Constitutional: Pt appears well-developed and well-nourished.  HENT: Head: Normocephalic.  Right Ear: External ear normal.  Left Ear: External ear normal.  Eyes: Conjunctivae and EOM are normal. Pupils are equal, round, and reactive to light.  Neck: Normal range of motion. Neck supple.  Cardiovascular: Normal rate and regular rhythm.   Pulmonary/Chest: Effort normal and breath sounds normal.  Abd:  Soft, NT, non-distended, + BS Neurological: Pt is alert. Not confused  Skin: Skin is warm. No erythema.  Psychiatric: Pt behavior is normal. Thought content normal.     Assessment & Plan:

## 2012-03-14 NOTE — Patient Instructions (Addendum)
I think we can hold off on antibiotic at this time The urine tests to be done will be sent to the lab You will be contacted by phone if any changes need to be made immediately.  Otherwise, you will receive a letter about your results with an explanation, but please check with MyChart first. Thank you for enrolling in [facility]. Please follow the instructions below to securely access your online medical record. MyChart allows you to send messages to your doctor, view your test results, renew your prescriptions, schedule appointments, and more. To Log into MyChart, please go to [email or web address], and your Username is: [email or web address] (pass  [email or web address]) Please call if you develop worsening symptoms, fever, pain or blood

## 2012-03-14 NOTE — Assessment & Plan Note (Signed)
Lab Results  Component Value Date   HGBA1C 6.2 12/27/2011   stable overall by hx and exam, most recent data reviewed with pt, and pt to continue medical treatment as before, doubt related to urinary frequency

## 2012-03-14 NOTE — Assessment & Plan Note (Signed)
stable overall by hx and exam, most recent data reviewed with pt, and pt to continue medical treatment as before BP Readings from Last 3 Encounters:  03/14/12 118/76  02/24/12 116/62  02/21/12 130/82

## 2012-03-15 LAB — URINE CULTURE: Colony Count: 8000

## 2012-03-16 DIAGNOSIS — M810 Age-related osteoporosis without current pathological fracture: Secondary | ICD-10-CM | POA: Diagnosis not present

## 2012-03-17 ENCOUNTER — Ambulatory Visit (INDEPENDENT_AMBULATORY_CARE_PROVIDER_SITE_OTHER): Payer: Medicare Other

## 2012-03-17 ENCOUNTER — Ambulatory Visit (INDEPENDENT_AMBULATORY_CARE_PROVIDER_SITE_OTHER): Payer: Medicare Other | Admitting: Cardiology

## 2012-03-17 ENCOUNTER — Encounter: Payer: Self-pay | Admitting: Cardiology

## 2012-03-17 VITALS — BP 140/86 | HR 60 | Ht 63.5 in | Wt 172.8 lb

## 2012-03-17 DIAGNOSIS — I420 Dilated cardiomyopathy: Secondary | ICD-10-CM

## 2012-03-17 DIAGNOSIS — I059 Rheumatic mitral valve disease, unspecified: Secondary | ICD-10-CM | POA: Diagnosis not present

## 2012-03-17 DIAGNOSIS — I359 Nonrheumatic aortic valve disorder, unspecified: Secondary | ICD-10-CM | POA: Diagnosis not present

## 2012-03-17 DIAGNOSIS — I10 Essential (primary) hypertension: Secondary | ICD-10-CM | POA: Diagnosis not present

## 2012-03-17 DIAGNOSIS — I4891 Unspecified atrial fibrillation: Secondary | ICD-10-CM

## 2012-03-17 DIAGNOSIS — Z7901 Long term (current) use of anticoagulants: Secondary | ICD-10-CM | POA: Diagnosis not present

## 2012-03-17 DIAGNOSIS — I428 Other cardiomyopathies: Secondary | ICD-10-CM | POA: Diagnosis not present

## 2012-03-17 DIAGNOSIS — E785 Hyperlipidemia, unspecified: Secondary | ICD-10-CM

## 2012-03-17 NOTE — Assessment & Plan Note (Signed)
Blood pressure controlled. Continue present medications. 

## 2012-03-17 NOTE — Patient Instructions (Addendum)

## 2012-03-17 NOTE — Assessment & Plan Note (Addendum)
Continue ARB and beta blocker. Her LV function is worse compared to previous. Note after her original surgery she had occlusion of her reimplanted left main from glue. She therefore had redo coronary artery bypassing graft. She will need cardiac catheterization to reassess her grafts. If patent she will need EP evaluation for consideration of ICD. Note she already has epicardial leads in place. The risks and benefits of cardiac catheterization were discussed and she agrees to proceed. Hold Glucophage 48 hours afterwards. She will contact us when she is ready to proceed.

## 2012-03-17 NOTE — Assessment & Plan Note (Signed)
Management per primary care. 

## 2012-03-17 NOTE — Assessment & Plan Note (Addendum)
Patient remains in sinus rhythm. She has been treated with flecainide previously as she did not have coronary disease (her left main was occluded with glue at time of initial reimplantation; she was then bypassed). FU EF 45-50. Now that EF is reduced, will need to change antiarrhythmics (most likely tikosyn). I will wait for cath and if no problem with grafts, proceed with EP eval; will get their opinion but would most likely change to tikosyn at time of generator implant. Continue Coumadin. Hold Coumadin 5 days prior to catheterization.  Addendum-given her newly reduced LV function I have elected to discontinue patient's flecainide now. I contacted the patient and she understands. If she develops recurrent atrial fibrillation off of flecainide and we will begin a different antiarrhythmic most likely tikosyn.

## 2012-03-17 NOTE — Progress Notes (Signed)
HPI: Pleasant female with history of severe AS, mitral regurgitation, aneurysm of the ascending thoracic aorta (status post aortic valve replacement, mitral valve repair, resection and grafting of the ascending thoracic aortic aneurysm on Jul 07, 2006). History of paroxysmal atrial fibrillation (status post maze procedure on Jul 07, 2006). Followup catheterization performed secondary to elevated troponin and recurrent atrial fibrillation revealed an ejection fraction of 25-30% and occlusion of the reimplanted left main. The patient subsequently underwent redo coronary artery bypass and graft with a LIMA to the LAD and a saphenous vein graft to the OM. She also had an epicardial lead placed if she required biventricular pacing in the future. She was readmitted in August of 2011 with atrial flutter and underwent cardioversion. Her flecainide was increased to 100 mg p.o. b.i.d. A followup exercise treadmill showed no exercise induced ventricular tachycardia. She was seen by Dr. Rayann Heman. He recommended continuing management with same medications. If atrial fibrillation recurred then we could consider ablation versus tikosyn. A followup echocardiogram was performed in October of 2013. Her ejection fraction was 35-40%. There was mild perivalvular aortic insufficiency. There was mild mitral stenosis and mild residual mitral regurgitation. There was mild to moderate tricuspid regurgitation. Mild biatrial enlargement. MUGA was performed in October of 2013 and showed an ejection fraction of 35%. Because patient's LV function was reduced a Myoview was performed. There was a fixed inferior defect consistent with thinning and a partially reversible anterior defect consistent with prior infarct and mild peri-infarct ischemia. Ejection fraction 33%, global hypokinesis and anteroseptal akinesis. Since that time she has some dyspnea on exertion but no orthopnea, PND, pedal edema, chest pain, palpitations or syncope. She recently  had a thoracic vertebral body fracture from coughing.   Current Outpatient Prescriptions  Medication Sig Dispense Refill  . Calcium Carbonate (CALCIUM 600) 1500 MG TABS Take 1 tablet by mouth as needed.       . Cholecalciferol (VITAMIN D3) 50000 UNITS CAPS Take 1 tablet by mouth once a week.  12 capsule  3  . flecainide (TAMBOCOR) 100 MG tablet TAKE 1 TABLET (100 MG TOTAL) BY MOUTH 2 (TWO) TIMES DAILY.  180 tablet  3  . furosemide (LASIX) 20 MG tablet TAKE 1 TABLET (20 MG TOTAL) BY MOUTH DAILY.  90 tablet  3  . KLOR-CON M20 20 MEQ tablet TAKE 1 TABLET EVERY DAY  90 tablet  3  . metFORMIN (GLUCOPHAGE-XR) 500 MG 24 hr tablet TAKE 4 TABLETS BY MOUTH EVERY EVENING  360 tablet  4  . metoprolol succinate (TOPROL-XL) 100 MG 24 hr tablet TAKE 1 TABLET BY MOUTH EVERY DAY  90 tablet  3  . metoprolol succinate (TOPROL-XL) 50 MG 24 hr tablet TAKE 1 TABLET (50 MG TOTAL) BY MOUTH DAILY. TAKE WITH 100MG  TABLET TO MAKE 150MG   90 tablet  3  . MICARDIS 20 MG tablet TAKE 1/2 TABLET BY MOUTH EVERY DAY  30 tablet  1  . omeprazole (PRILOSEC OTC) 20 MG tablet Take 20 mg by mouth daily.        . pioglitazone (ACTOS) 45 MG tablet TAKE 1 TABLET EVERY DAY  90 tablet  4  . vitamin B-12 (CYANOCOBALAMIN) 250 MCG tablet Take 250 mcg by mouth daily. Take 3 times a week      . warfarin (COUMADIN) 2 MG tablet TAKE AS DIRECTED BY COUMADIN CLINIC.  30 tablet  1  . warfarin (COUMADIN) 4 MG tablet TAKE AS DIRECTED BY THE ANTICOAGULATION CLINIC  30 tablet  3  Past Medical History  Diagnosis Date  . Hyperlipidemia   . Anemia   . Atrial fibrillation   . Diverticulitis   . Valvular heart disease     per HPI  . CVA (cerebral vascular accident)     s/p CVA felt due to oscillating calcium on aortic valve  . Carcinoma in situ of vulva   . Fibroid   . Ovarian cyst, left 2008-2009  . Osteoporosis   . Diabetes mellitus   . Hypertension   . GERD (gastroesophageal reflux disease)     Past Surgical History  Procedure Date    . Foot surgery     removed neuroma  . Aortic root replacement     homograft  . Mv repair     due to severe MR  . Resection and grafting of ascending thoracic aortic   . Aneurysm and left sided maze 2008  . Coronary artery bypass graft 4/10    redone due to occluded LM at implantation site (LIMA to LAD, SVG to OM)  . Wide excision of vulva     CA INSITU    History   Social History  . Marital Status: Married    Spouse Name: N/A    Number of Children: N/A  . Years of Education: N/A   Occupational History  . Not on file.   Social History Main Topics  . Smoking status: Former Smoker    Quit date: 03/09/1983  . Smokeless tobacco: Never Used  . Alcohol Use: No  . Drug Use: No  . Sexually Active: Yes    Birth Control/ Protection: Post-menopausal   Other Topics Concern  . Not on file   Social History Narrative   No regular exercise    ROS: back pain but no fevers or chills, productive cough, hemoptysis, dysphasia, odynophagia, melena, hematochezia, dysuria, hematuria, rash, seizure activity, orthopnea, PND, pedal edema, claudication. Remaining systems are negative.  Physical Exam: Well-developed well-nourished in no acute distress.  Skin is warm and dry.  HEENT is normal.  Neck is supple.  Chest is clear to auscultation with normal expansion.  Cardiovascular exam is regular rate and rhythm. 2/6 systolic murmur Abdominal exam nontender or distended. No masses palpated. Extremities show no edema. neuro grossly intact

## 2012-03-17 NOTE — Assessment & Plan Note (Signed)
Status post aortic valve replacement. Continue SBE prophylaxis. 

## 2012-03-17 NOTE — Assessment & Plan Note (Signed)
Continue SBE prophylaxis. 

## 2012-03-20 ENCOUNTER — Ambulatory Visit (INDEPENDENT_AMBULATORY_CARE_PROVIDER_SITE_OTHER): Payer: Medicare Other | Admitting: *Deleted

## 2012-03-20 ENCOUNTER — Other Ambulatory Visit: Payer: Self-pay | Admitting: Cardiology

## 2012-03-20 ENCOUNTER — Telehealth: Payer: Self-pay | Admitting: Cardiology

## 2012-03-20 ENCOUNTER — Other Ambulatory Visit: Payer: Self-pay | Admitting: *Deleted

## 2012-03-20 ENCOUNTER — Encounter: Payer: Self-pay | Admitting: *Deleted

## 2012-03-20 DIAGNOSIS — Z7901 Long term (current) use of anticoagulants: Secondary | ICD-10-CM | POA: Diagnosis not present

## 2012-03-20 DIAGNOSIS — I4891 Unspecified atrial fibrillation: Secondary | ICD-10-CM | POA: Diagnosis not present

## 2012-03-20 DIAGNOSIS — I2510 Atherosclerotic heart disease of native coronary artery without angina pectoris: Secondary | ICD-10-CM

## 2012-03-20 DIAGNOSIS — I359 Nonrheumatic aortic valve disorder, unspecified: Secondary | ICD-10-CM

## 2012-03-20 DIAGNOSIS — I059 Rheumatic mitral valve disease, unspecified: Secondary | ICD-10-CM

## 2012-03-20 DIAGNOSIS — I2589 Other forms of chronic ischemic heart disease: Secondary | ICD-10-CM

## 2012-03-20 LAB — CBC WITH DIFFERENTIAL/PLATELET
Eosinophils Absolute: 0.1 10*3/uL (ref 0.0–0.7)
HCT: 31.8 % — ABNORMAL LOW (ref 36.0–46.0)
Lymphs Abs: 0.8 10*3/uL (ref 0.7–4.0)
MCHC: 30.6 g/dL (ref 30.0–36.0)
MCV: 83.5 fl (ref 78.0–100.0)
Monocytes Absolute: 0.5 10*3/uL (ref 0.1–1.0)
Neutrophils Relative %: 84.3 % — ABNORMAL HIGH (ref 43.0–77.0)
Platelets: 423 10*3/uL — ABNORMAL HIGH (ref 150.0–400.0)

## 2012-03-20 LAB — BASIC METABOLIC PANEL
Chloride: 104 mEq/L (ref 96–112)
Creatinine, Ser: 1 mg/dL (ref 0.4–1.2)
GFR: 60.92 mL/min (ref >60.00)
Potassium: 4.9 mEq/L (ref 3.5–5.1)

## 2012-03-20 MED ORDER — ENOXAPARIN SODIUM 80 MG/0.8ML ~~LOC~~ SOLN: 80.0000 mg | Freq: Two times a day (BID) | SUBCUTANEOUS | Status: DC

## 2012-03-20 NOTE — Telephone Encounter (Signed)
New Problem:    Patient called in wanting to ask you a few questions about her lab results.  Please call back.

## 2012-03-20 NOTE — Telephone Encounter (Signed)
Spoke with pt, questions regarding labs answered. 

## 2012-03-21 ENCOUNTER — Encounter (HOSPITAL_BASED_OUTPATIENT_CLINIC_OR_DEPARTMENT_OTHER)
Admission: RE | Disposition: A | Discharge status: Home / Self Care | Payer: Self-pay | Source: Ambulatory Visit | Attending: Cardiovascular Disease

## 2012-03-21 ENCOUNTER — Inpatient Hospital Stay (HOSPITAL_BASED_OUTPATIENT_CLINIC_OR_DEPARTMENT_OTHER)
Admission: RE | Admit: 2012-03-21 | Discharge: 2012-03-21 | Disposition: A | Discharge status: Home / Self Care | Payer: Medicare Other | Source: Ambulatory Visit | Attending: Cardiovascular Disease | Admitting: Cardiovascular Disease

## 2012-03-21 DIAGNOSIS — Z952 Presence of prosthetic heart valve: Secondary | ICD-10-CM | POA: Insufficient documentation

## 2012-03-21 DIAGNOSIS — I509 Heart failure, unspecified: Secondary | ICD-10-CM | POA: Insufficient documentation

## 2012-03-21 DIAGNOSIS — I2589 Other forms of chronic ischemic heart disease: Secondary | ICD-10-CM

## 2012-03-21 DIAGNOSIS — I059 Rheumatic mitral valve disease, unspecified: Secondary | ICD-10-CM | POA: Insufficient documentation

## 2012-03-21 DIAGNOSIS — I2582 Chronic total occlusion of coronary artery: Secondary | ICD-10-CM | POA: Insufficient documentation

## 2012-03-21 DIAGNOSIS — I2510 Atherosclerotic heart disease of native coronary artery without angina pectoris: Secondary | ICD-10-CM | POA: Diagnosis not present

## 2012-03-21 SURGERY — JV LEFT HEART CATHETERIZATION WITH CORONARY ANGIOGRAM

## 2012-03-21 MED ORDER — ACETAMINOPHEN 325 MG PO TABS: 650.0000 mg | ORAL_TABLET | ORAL | Status: DC | PRN

## 2012-03-21 MED ORDER — DIAZEPAM 2 MG PO TABS: 2.0000 mg | ORAL_TABLET | ORAL | Status: DC

## 2012-03-21 MED ORDER — ASPIRIN 81 MG PO CHEW
324.0000 mg | CHEWABLE_TABLET | ORAL | Status: AC
  Administered 2012-03-21: 324 mg via ORAL

## 2012-03-21 MED ORDER — DIAZEPAM 5 MG PO TABS
5.0000 mg | ORAL_TABLET | ORAL | Status: AC
  Administered 2012-03-21: 5 mg via ORAL

## 2012-03-21 MED ORDER — SODIUM CHLORIDE 0.9 % IV SOLN
1.0000 mL/kg/h | INTRAVENOUS | Status: DC
  Administered 2012-03-21: 1 mL/kg/h via INTRAVENOUS

## 2012-03-21 MED ORDER — SODIUM CHLORIDE 0.9 % IJ SOLN: 3.0000 mL | Freq: Two times a day (BID) | INTRAMUSCULAR | Status: DC

## 2012-03-21 MED ORDER — SODIUM CHLORIDE 0.9 % IV SOLN: 250.0000 mL | INTRAVENOUS | Status: DC | PRN

## 2012-03-21 MED ORDER — ONDANSETRON HCL 4 MG/2ML IJ SOLN: 4.0000 mg | Freq: Four times a day (QID) | INTRAMUSCULAR | Status: DC | PRN

## 2012-03-21 MED ORDER — SODIUM CHLORIDE 0.9 % IV SOLN: 1.0000 mL/kg/h | INTRAVENOUS | Status: DC

## 2012-03-21 MED ORDER — SODIUM CHLORIDE 0.9 % IJ SOLN: 3.0000 mL | INTRAMUSCULAR | Status: DC | PRN

## 2012-03-21 NOTE — CV Procedure (Signed)
   Cardiac Catheterization Procedure Note  Name: [name] MRN: [id number] DOB: [birth year]  Procedure: Left Heart Cath, Selective Coronary Angiography, LV angiography, LIMA angiography, saphenous vein graft angiography.  Indication: 72 year-old woman with a complex cardiac history. She has a history of aortic stenosis. She underwent homograft aortic root replacement and mitral valve repair in 2008. She subsequently developed congestive heart failure and was noted to have ostial occlusion of the reimplanted left mainstem. She then underwent CABG with saphenous vein graft to obtuse marginal and LIMA to LAD. She now presents with progressive LV dysfunction and was referred for cardiac catheterization to rule out obstructive coronary artery disease.  Procedural details: The right groin was prepped, draped, and anesthetized with 1% lidocaine. Using modified Seldinger technique, a 4 French sheath was introduced into the right femoral artery. Standard Judkins catheters were used for coronary angiography, LIMA angiography, saphenous vein graft angiography, and left ventriculography. Catheter exchanges were performed over a guidewire. There were no immediate procedural complications. The patient was transferred to the post catheterization recovery area for further monitoring.  Procedural Findings: Hemodynamics:  AO 138/67 LV 140/50   Coronary angiography: Coronary dominance: right  Left mainstem: Total occlusion at the ostium  Left anterior descending (LAD): Patent to the left ventricular apex. Fills entirely from retrograde flow supplied by the saphenous vein graft obtuse marginal  Left circumflex (LCx): The left circumflex and its branch vessels are patent. They fill entirely from the saphenous vein graft obtuse marginal  Right coronary artery (RCA): This is a very large, dominant vessel. The vessel is smooth throughout its course without any significant obstructive disease. There is a large PDA  branch and a large posterolateral branch, both of which are patent.  LIMA to LAD: Patent but atretic  Saphenous vein graft to obtuse marginal: Widely patent throughout. The coronary anastomotic site is widely patent. This fills the obtuse marginal/ramus intermedius and antegrade fashion. The entire LAD fills retrograde from this graft. The AV groove circumflex also fills retrograde and supplies 2 more distal obtuse marginal branches.  Left ventriculography: The mid anterior wall is severely hypokinetic. The apex is severely hypokinetic. The inferior wall is mildly hypokinetic. Left ventricular ejection fraction is estimated at 35%.  Final Conclusions:   1. 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 Wide patency of the right coronary artery 3. Atretic LIMA to LAD 4. Widely patent saphenous vein graft to obtuse marginal supplying all branches of the left coronary artery 5. Severe segmental left ventricular systolic dysfunction  [name] 03/21/2012, 11:28 AM

## 2012-03-21 NOTE — H&P (View-Only) (Signed)
HPI: Pleasant female with history of severe AS, mitral regurgitation, aneurysm of the ascending thoracic aorta (status post aortic valve replacement, mitral valve repair, resection and grafting of the ascending thoracic aortic aneurysm on Jul 07, 2006). History of paroxysmal atrial fibrillation (status post maze procedure on Jul 07, 2006). Followup catheterization performed secondary to elevated troponin and recurrent atrial fibrillation revealed an ejection fraction of 25-30% and occlusion of the reimplanted left main. The patient subsequently underwent redo coronary artery bypass and graft with a LIMA to the LAD and a saphenous vein graft to the OM. She also had an epicardial lead placed if she required biventricular pacing in the future. She was readmitted in August of 2011 with atrial flutter and underwent cardioversion. Her flecainide was increased to 100 mg p.o. b.i.d. A followup exercise treadmill showed no exercise induced ventricular tachycardia. She was seen by Dr. Rayann Heman. He recommended continuing management with same medications. If atrial fibrillation recurred then we could consider ablation versus tikosyn. A followup echocardiogram was performed in October of 2013. Her ejection fraction was 35-40%. There was mild perivalvular aortic insufficiency. There was mild mitral stenosis and mild residual mitral regurgitation. There was mild to moderate tricuspid regurgitation. Mild biatrial enlargement. MUGA was performed in October of 2013 and showed an ejection fraction of 35%. Because patient's LV function was reduced a Myoview was performed. There was a fixed inferior defect consistent with thinning and a partially reversible anterior defect consistent with prior infarct and mild peri-infarct ischemia. Ejection fraction 33%, global hypokinesis and anteroseptal akinesis. Since that time she has some dyspnea on exertion but no orthopnea, PND, pedal edema, chest pain, palpitations or syncope. She recently  had a thoracic vertebral body fracture from coughing.   Current Outpatient Prescriptions  Medication Sig Dispense Refill  . Calcium Carbonate (CALCIUM 600) 1500 MG TABS Take 1 tablet by mouth as needed.       . Cholecalciferol (VITAMIN D3) 50000 UNITS CAPS Take 1 tablet by mouth once a week.  12 capsule  3  . flecainide (TAMBOCOR) 100 MG tablet TAKE 1 TABLET (100 MG TOTAL) BY MOUTH 2 (TWO) TIMES DAILY.  180 tablet  3  . furosemide (LASIX) 20 MG tablet TAKE 1 TABLET (20 MG TOTAL) BY MOUTH DAILY.  90 tablet  3  . KLOR-CON M20 20 MEQ tablet TAKE 1 TABLET EVERY DAY  90 tablet  3  . metFORMIN (GLUCOPHAGE-XR) 500 MG 24 hr tablet TAKE 4 TABLETS BY MOUTH EVERY EVENING  360 tablet  4  . metoprolol succinate (TOPROL-XL) 100 MG 24 hr tablet TAKE 1 TABLET BY MOUTH EVERY DAY  90 tablet  3  . metoprolol succinate (TOPROL-XL) 50 MG 24 hr tablet TAKE 1 TABLET (50 MG TOTAL) BY MOUTH DAILY. TAKE WITH 100MG  TABLET TO MAKE 150MG   90 tablet  3  . MICARDIS 20 MG tablet TAKE 1/2 TABLET BY MOUTH EVERY DAY  30 tablet  1  . omeprazole (PRILOSEC OTC) 20 MG tablet Take 20 mg by mouth daily.        . pioglitazone (ACTOS) 45 MG tablet TAKE 1 TABLET EVERY DAY  90 tablet  4  . vitamin B-12 (CYANOCOBALAMIN) 250 MCG tablet Take 250 mcg by mouth daily. Take 3 times a week      . warfarin (COUMADIN) 2 MG tablet TAKE AS DIRECTED BY COUMADIN CLINIC.  30 tablet  1  . warfarin (COUMADIN) 4 MG tablet TAKE AS DIRECTED BY THE ANTICOAGULATION CLINIC  30 tablet  3  Past Medical History  Diagnosis Date  . Hyperlipidemia   . Anemia   . Atrial fibrillation   . Diverticulitis   . Valvular heart disease     per HPI  . CVA (cerebral vascular accident)     s/p CVA felt due to oscillating calcium on aortic valve  . Carcinoma in situ of vulva   . Fibroid   . Ovarian cyst, left 2008-2009  . Osteoporosis   . Diabetes mellitus   . Hypertension   . GERD (gastroesophageal reflux disease)     Past Surgical History  Procedure Date    . Foot surgery     removed neuroma  . Aortic root replacement     homograft  . Mv repair     due to severe MR  . Resection and grafting of ascending thoracic aortic   . Aneurysm and left sided maze 2008  . Coronary artery bypass graft 4/10    redone due to occluded LM at implantation site (LIMA to LAD, SVG to OM)  . Wide excision of vulva     CA INSITU    History   Social History  . Marital Status: Married    Spouse Name: N/A    Number of Children: N/A  . Years of Education: N/A   Occupational History  . Not on file.   Social History Main Topics  . Smoking status: Former Smoker    Quit date: 03/09/1983  . Smokeless tobacco: Never Used  . Alcohol Use: No  . Drug Use: No  . Sexually Active: Yes    Birth Control/ Protection: Post-menopausal   Other Topics Concern  . Not on file   Social History Narrative   No regular exercise    ROS: back pain but no fevers or chills, productive cough, hemoptysis, dysphasia, odynophagia, melena, hematochezia, dysuria, hematuria, rash, seizure activity, orthopnea, PND, pedal edema, claudication. Remaining systems are negative.  Physical Exam: Well-developed well-nourished in no acute distress.  Skin is warm and dry.  HEENT is normal.  Neck is supple.  Chest is clear to auscultation with normal expansion.  Cardiovascular exam is regular rate and rhythm. 2/6 systolic murmur Abdominal exam nontender or distended. No masses palpated. Extremities show no edema. neuro grossly intact

## 2012-03-21 NOTE — Interval H&P Note (Signed)
History and Physical Interval Note:  03/21/2012 10:42 AM  [name]  has presented today for surgery, with the diagnosis of cp  The various methods of treatment have been discussed with the patient and family. After consideration of risks, benefits and other options for treatment, the patient has consented to  Procedure(s) (LRB) with comments: JV LEFT HEART CATHETERIZATION WITH CORONARY ANGIOGRAM (N/A) as a surgical intervention .  The patient's history has been reviewed, patient examined, no change in status, stable for surgery.  I have reviewed the patient's chart and labs.  Questions were answered to the patient's satisfaction.     [name]

## 2012-03-21 NOTE — Progress Notes (Signed)
Bedrest begins @ 1130, tegaderm dressing applied to right groin site by Moishe Spice, groin site level 0.

## 2012-03-21 NOTE — Interval H&P Note (Signed)
History and Physical Interval Note:  03/21/2012 11:22 AM  [name]  has presented today for surgery, with the diagnosis of cp  The various methods of treatment have been discussed with the patient and family. After consideration of risks, benefits and other options for treatment, the patient has consented to  Procedure(s) (LRB) with comments: JV LEFT HEART CATHETERIZATION WITH CORONARY ANGIOGRAM (N/A) as a surgical intervention .  The patient's history has been reviewed, patient examined, no change in status, stable for surgery.  I have reviewed the patient's chart and labs.  Questions were answered to the patient's satisfaction.     [name]

## 2012-03-27 ENCOUNTER — Ambulatory Visit (INDEPENDENT_AMBULATORY_CARE_PROVIDER_SITE_OTHER): Payer: Medicare Other | Admitting: Pharmacist

## 2012-03-27 DIAGNOSIS — Z7901 Long term (current) use of anticoagulants: Secondary | ICD-10-CM | POA: Diagnosis not present

## 2012-03-27 DIAGNOSIS — I359 Nonrheumatic aortic valve disorder, unspecified: Secondary | ICD-10-CM

## 2012-03-27 DIAGNOSIS — I059 Rheumatic mitral valve disease, unspecified: Secondary | ICD-10-CM

## 2012-03-27 DIAGNOSIS — I4891 Unspecified atrial fibrillation: Secondary | ICD-10-CM

## 2012-03-27 LAB — POCT INR: INR: 2

## 2012-03-28 DIAGNOSIS — R310 Gross hematuria: Secondary | ICD-10-CM | POA: Diagnosis not present

## 2012-03-31 ENCOUNTER — Encounter (INDEPENDENT_AMBULATORY_CARE_PROVIDER_SITE_OTHER): Payer: Medicare Other

## 2012-03-31 ENCOUNTER — Encounter: Payer: Self-pay | Admitting: Cardiology

## 2012-03-31 ENCOUNTER — Ambulatory Visit (INDEPENDENT_AMBULATORY_CARE_PROVIDER_SITE_OTHER): Payer: Medicare Other | Admitting: Cardiology

## 2012-03-31 VITALS — BP 120/66 | HR 89 | Ht 63.5 in | Wt 168.8 lb

## 2012-03-31 DIAGNOSIS — I724 Aneurysm of artery of lower extremity: Secondary | ICD-10-CM

## 2012-03-31 DIAGNOSIS — E785 Hyperlipidemia, unspecified: Secondary | ICD-10-CM

## 2012-03-31 DIAGNOSIS — R0989 Other specified symptoms and signs involving the circulatory and respiratory systems: Secondary | ICD-10-CM | POA: Insufficient documentation

## 2012-03-31 DIAGNOSIS — I4891 Unspecified atrial fibrillation: Secondary | ICD-10-CM | POA: Diagnosis not present

## 2012-03-31 DIAGNOSIS — I10 Essential (primary) hypertension: Secondary | ICD-10-CM

## 2012-03-31 DIAGNOSIS — I359 Nonrheumatic aortic valve disorder, unspecified: Secondary | ICD-10-CM

## 2012-03-31 DIAGNOSIS — I420 Dilated cardiomyopathy: Secondary | ICD-10-CM

## 2012-03-31 DIAGNOSIS — D509 Iron deficiency anemia, unspecified: Secondary | ICD-10-CM

## 2012-03-31 DIAGNOSIS — I059 Rheumatic mitral valve disease, unspecified: Secondary | ICD-10-CM

## 2012-03-31 DIAGNOSIS — I428 Other cardiomyopathies: Secondary | ICD-10-CM

## 2012-03-31 NOTE — Assessment & Plan Note (Signed)
Management per primary care. 

## 2012-03-31 NOTE — Patient Instructions (Addendum)
Your physician recommends that you schedule a follow-up appointment in: 3 MONTHS WITH DR CRENSHAW  APPT TO DR Rayann Heman TO DISCUSS ICD AND ATRIAL FIB

## 2012-03-31 NOTE — Assessment & Plan Note (Signed)
Status post aortic valve replacement. Continue SBE prophylaxis. 

## 2012-03-31 NOTE — Assessment & Plan Note (Addendum)
Patient with pulsatile mass bruit at previous cath site. Schedule ultrasound to exclude pseudoaneurysm.  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 Maximum dimension of the pseudoaneurysm was 1.6 x 1.9 cm. The patient was examined. She has mild tenderness with a pulsatile mass and diffuse ecchymoses. She really hasn't had much pain. Considering her chronic anticoagulation with warfarin, I'm concerned that she may not have spontaneous closure of her pseudoaneurysm. I have recommended that she come in Monday for ultrasound guided compression. She understands that if she develops acute onset of pain or swelling in the groin that she needs to call 911 and apply pressure over that area.  [name] 03/31/2012 3:21 PM

## 2012-03-31 NOTE — Assessment & Plan Note (Signed)
I have asked her to followup with primary care for anemia.

## 2012-03-31 NOTE — Progress Notes (Signed)
HPI: Pleasant female with history of severe AS, mitral regurgitation, aneurysm of the ascending thoracic aorta (status post aortic valve replacement, mitral valve repair, resection and grafting of the ascending thoracic aortic aneurysm on Jul 07, 2006). History of paroxysmal atrial fibrillation (status post maze procedure on Jul 07, 2006). Followup catheterization performed secondary to elevated troponin and recurrent atrial fibrillation revealed an ejection fraction of 25-30% and occlusion of the reimplanted left main. The patient subsequently underwent redo coronary artery bypass and graft with a LIMA to the LAD and a saphenous vein graft to the OM. She also had an epicardial lead placed if she required biventricular pacing in the future. She was readmitted in August of 2011 with atrial flutter and underwent cardioversion. Her flecainide was increased to 100 mg p.o. b.i.d. A followup exercise treadmill showed no exercise induced ventricular tachycardia. She was seen by Dr. Rayann Heman. He recommended continuing management with same medications. If atrial fibrillation recurred then we could consider ablation versus tikosyn. A followup echocardiogram was performed in October of 2013. Her ejection fraction was 35-40%. There was mild perivalvular aortic insufficiency. There was mild mitral stenosis and mild residual mitral regurgitation. There was mild to moderate tricuspid regurgitation. Mild biatrial enlargement. MUGA was performed in October of 2013 and showed an ejection fraction of 35%. Because patient's LV function was reduced a Myoview was performed. There was a fixed inferior defect consistent with thinning and a partially reversible anterior defect consistent with prior infarct and mild peri-infarct ischemia. Ejection fraction 33%, global hypokinesis and anteroseptal akinesis. Cardiac catheterization repeated in January of 2014. The left main was occluded. The LAD, circumflex and right coronary artery were  patent. The LIMA to the LAD was patent but atretic. Saphenous vein graft to the obtuse marginal was patent. This graft filled the obtuse marginal and ramus intermedius and the entire LAD filled retrograde from this graft. Ejection fraction was 35%. Note we previously discontinued her flecainide. preoperative laboratories showed a hemoglobin of 9.7 with an MCV of 83.5. Since that time, she does have some dyspnea on exertion but no orthopnea, PND, pedal edema, chest pain or syncope. She has had brief episode of atrial fibrillation by her report.  Current Outpatient Prescriptions  Medication Sig Dispense Refill  . Cholecalciferol (VITAMIN D3) 50000 UNITS CAPS Take 1 tablet by mouth once a week.  12 capsule  3  . furosemide (LASIX) 20 MG tablet TAKE 1 TABLET (20 MG TOTAL) BY MOUTH DAILY.  90 tablet  3  . KLOR-CON M20 20 MEQ tablet TAKE 1 TABLET EVERY DAY  90 tablet  3  . metFORMIN (GLUCOPHAGE-XR) 500 MG 24 hr tablet TAKE 4 TABLETS BY MOUTH EVERY EVENING  360 tablet  4  . metoprolol succinate (TOPROL-XL) 100 MG 24 hr tablet TAKE 1 TABLET BY MOUTH EVERY DAY  90 tablet  3  . metoprolol succinate (TOPROL-XL) 50 MG 24 hr tablet TAKE 1 TABLET (50 MG TOTAL) BY MOUTH DAILY. TAKE WITH 100MG  TABLET TO MAKE 150MG   90 tablet  3  . MICARDIS 20 MG tablet TAKE 1/2 TABLET BY MOUTH EVERY DAY  30 tablet  1  . omeprazole (PRILOSEC OTC) 20 MG tablet Take 20 mg by mouth daily.        . pioglitazone (ACTOS) 45 MG tablet TAKE 1 TABLET EVERY DAY  90 tablet  4  . vitamin B-12 (CYANOCOBALAMIN) 250 MCG tablet Take 250 mcg by mouth daily. Take 3 times a week      . warfarin (COUMADIN)  2 MG tablet TAKE AS DIRECTED BY COUMADIN CLINIC.  30 tablet  1  . warfarin (COUMADIN) 4 MG tablet TAKE AS DIRECTED BY THE ANTICOAGULATION CLINIC  30 tablet  3     Past Medical History  Diagnosis Date  . Hyperlipidemia   . Anemia   . Atrial fibrillation   . Diverticulitis   . Valvular heart disease     per HPI  . CVA (cerebral vascular  accident)     s/p CVA felt due to oscillating calcium on aortic valve  . Carcinoma in situ of vulva   . Fibroid   . Ovarian cyst, left 2008-2009  . Osteoporosis   . Diabetes mellitus   . Hypertension   . GERD (gastroesophageal reflux disease)     Past Surgical History  Procedure Date  . Foot surgery     removed neuroma  . Aortic root replacement     homograft  . Mv repair     due to severe MR  . Resection and grafting of ascending thoracic aortic   . Aneurysm and left sided maze 2008  . Coronary artery bypass graft 4/10    redone due to occluded LM at implantation site (LIMA to LAD, SVG to OM)  . Wide excision of vulva     CA INSITU    History   Social History  . Marital Status: Married    Spouse Name: N/A    Number of Children: N/A  . Years of Education: N/A   Occupational History  . Not on file.   Social History Main Topics  . Smoking status: Former Smoker    Quit date: 03/09/1983  . Smokeless tobacco: Never Used  . Alcohol Use: No  . Drug Use: No  . Sexually Active: Yes    Birth Control/ Protection: Post-menopausal   Other Topics Concern  . Not on file   Social History Narrative   No regular exercise    ROS: hematuria but no fevers or chills, productive cough, hemoptysis, dysphasia, odynophagia, melena, hematochezia, dysuria, rash, seizure activity, orthopnea, PND, pedal edema, claudication. Remaining systems are negative.  Physical Exam: Well-developed well-nourished in no acute distress.  Skin is warm and dry.  HEENT is normal.  Neck is supple.  Chest is clear to auscultation with normal expansion.  Cardiovascular exam is regular rate and rhythm. 2/6 systolic murmur left sternal border Abdominal exam nontender or distended. No masses palpated. Right groin with bruit and small palpable pulsatile mass. Extremities show no edema. neuro grossly intact  ECG sinus rhythm at a rate of 94. Occasional PACs. Cannot rule out prior septal infarct.

## 2012-03-31 NOTE — Assessment & Plan Note (Signed)
Status post mitral valve repair. Continue SBE prophylaxis.

## 2012-03-31 NOTE — Assessment & Plan Note (Signed)
Patient remains in sinus rhythm. She did have an episode of atrial fibrillation off of flecainide. She has had recurrent episodes in the past status post maze procedure. Flecainide discontinued because of structural heart disease and worsening LV function. She will most likely need an antiarrhythmic to maintain sinus rhythm. Best option may be tikosyn which could be initiated during hospitalization for her defibrillator. I will review this with Dr. Rayann Heman.

## 2012-03-31 NOTE — Assessment & Plan Note (Signed)
Continue present medications. 

## 2012-03-31 NOTE — Assessment & Plan Note (Signed)
Continue ARB and beta blocker. Cath results noted. Plan EP evaluation for consideration of ICD. Note she already has epicardial leads in place.

## 2012-04-03 ENCOUNTER — Other Ambulatory Visit: Payer: Self-pay | Admitting: Cardiovascular Disease

## 2012-04-03 ENCOUNTER — Encounter (HOSPITAL_COMMUNITY)
Admission: RE | Disposition: A | Discharge status: Home / Self Care | Payer: Self-pay | Source: Ambulatory Visit | Attending: Cardiovascular Disease

## 2012-04-03 ENCOUNTER — Ambulatory Visit (HOSPITAL_COMMUNITY)
Admission: RE | Admit: 2012-04-03 | Discharge: 2012-04-03 | Disposition: A | Discharge status: Home / Self Care | Payer: Medicare Other | Source: Ambulatory Visit | Attending: Cardiovascular Disease | Admitting: Cardiovascular Disease

## 2012-04-03 DIAGNOSIS — Z7901 Long term (current) use of anticoagulants: Secondary | ICD-10-CM | POA: Diagnosis not present

## 2012-04-03 DIAGNOSIS — I724 Aneurysm of artery of lower extremity: Secondary | ICD-10-CM | POA: Diagnosis not present

## 2012-04-03 DIAGNOSIS — T81718A Complication of other artery following a procedure, not elsewhere classified, initial encounter: Secondary | ICD-10-CM

## 2012-04-03 DIAGNOSIS — I2510 Atherosclerotic heart disease of native coronary artery without angina pectoris: Secondary | ICD-10-CM | POA: Insufficient documentation

## 2012-04-03 DIAGNOSIS — I999 Unspecified disorder of circulatory system: Secondary | ICD-10-CM | POA: Insufficient documentation

## 2012-04-03 DIAGNOSIS — E785 Hyperlipidemia, unspecified: Secondary | ICD-10-CM

## 2012-04-03 DIAGNOSIS — I4891 Unspecified atrial fibrillation: Secondary | ICD-10-CM | POA: Insufficient documentation

## 2012-04-03 DIAGNOSIS — Z951 Presence of aortocoronary bypass graft: Secondary | ICD-10-CM | POA: Diagnosis not present

## 2012-04-03 DIAGNOSIS — Y840 Cardiac catheterization as the cause of abnormal reaction of the patient, or of later complication, without mention of misadventure at the time of the procedure: Secondary | ICD-10-CM | POA: Insufficient documentation

## 2012-04-03 HISTORY — PX: RIGHT HEART CATHETERIZATION: SHX5447

## 2012-04-03 LAB — GLUCOSE, CAPILLARY: Glucose-Capillary: 92 mg/dL (ref 70–99)

## 2012-04-03 SURGERY — RIGHT HEART CATH
Anesthesia: LOCAL

## 2012-04-03 MED ORDER — MORPHINE SULFATE 10 MG/ML IJ SOLN: 4.0000 mg | INTRAMUSCULAR | Status: DC

## 2012-04-03 MED ORDER — METOPROLOL TARTRATE 1 MG/ML IV SOLN
INTRAVENOUS | Status: AC
  Filled 2012-04-03: qty 5

## 2012-04-03 MED ORDER — SODIUM CHLORIDE 0.9 % IJ SOLN
3.0000 mL | Freq: Once | INTRAMUSCULAR | Status: DC
Start: 2012-04-03 — End: 2012-04-03

## 2012-04-03 MED ORDER — MORPHINE SULFATE 4 MG/ML IJ SOLN: 4.0000 mg | INTRAMUSCULAR | Status: DC

## 2012-04-03 MED ORDER — MORPHINE SULFATE 10 MG/ML IJ SOLN
INTRAMUSCULAR | Status: AC
  Filled 2012-04-03: qty 1

## 2012-04-03 NOTE — Progress Notes (Signed)
DR COOPER IN; OK TO D/C HOME; NO NEW ORDERS

## 2012-04-03 NOTE — H&P (Signed)
[name]   03/31/2012 12:30 PM Office Visit  MRN: [id number]   Description: 72 year old female  Provider: Lelon Perla, MD  Department: [facility]        Referring Provider     [name], MD      Diagnoses     Bruit   - Primary    [id number]    Atrial fibrillation     427.31    Congestive dilated cardiomyopathy     425.4    Other and unspecified hyperlipidemia     272.4    Essential hypertension, benign     401.1    Iron deficiency anemia, unspecified     280.9    Aortic valve disorders     424.1    Mitral valve disease     394.9      Reason for Visit     Follow-up    Cardiomyopathy         Progress Notes     [name], MD  03/31/2012  1:09 PM  Signed    HPI: Pleasant female with history of severe AS, mitral regurgitation, aneurysm of the ascending thoracic aorta (status post aortic valve replacement, mitral valve repair, resection and grafting of the ascending thoracic aortic aneurysm on Jul 07, 2006). History of paroxysmal atrial fibrillation (status post maze procedure on Jul 07, 2006). Followup catheterization performed secondary to elevated troponin and recurrent atrial fibrillation revealed an ejection fraction of 25-30% and occlusion of the reimplanted left main. The patient subsequently underwent redo coronary artery bypass and graft with a LIMA to the LAD and a saphenous vein graft to the OM. She also had an epicardial lead placed if she required biventricular pacing in the future. She was readmitted in August of 2011 with atrial flutter and underwent cardioversion. Her flecainide was increased to 100 mg p.o. b.i.d. A followup exercise treadmill showed no exercise induced ventricular tachycardia. She was seen by Dr. Rayann Heman. He recommended continuing management with same medications. If atrial fibrillation recurred then we could consider ablation versus tikosyn. A followup echocardiogram was performed in October of 2013. Her ejection fraction  was 35-40%. There was mild perivalvular aortic insufficiency. There was mild mitral stenosis and mild residual mitral regurgitation. There was mild to moderate tricuspid regurgitation. Mild biatrial enlargement. MUGA was performed in October of 2013 and showed an ejection fraction of 35%. Because patient's LV function was reduced a Myoview was performed. There was a fixed inferior defect consistent with thinning and a partially reversible anterior defect consistent with prior infarct and mild peri-infarct ischemia. Ejection fraction 33%, global hypokinesis and anteroseptal akinesis. Cardiac catheterization repeated in January of 2014. The left main was occluded. The LAD, circumflex and right coronary artery were patent. The LIMA to the LAD was patent but atretic. Saphenous vein graft to the obtuse marginal was patent. This graft filled the obtuse marginal and ramus intermedius and the entire LAD filled retrograde from this graft. Ejection fraction was 35%. Note we previously discontinued her flecainide. preoperative laboratories showed a hemoglobin of 9.7 with an MCV of 83.5. Since that time, she does have some dyspnea on exertion but no orthopnea, PND, pedal edema, chest pain or syncope. She has had brief episode of atrial fibrillation by her report.    Current Outpatient Prescriptions   Medication  Sig  Dispense  Refill   .  Cholecalciferol (VITAMIN D3) 50000 UNITS CAPS  Take 1 tablet by mouth once a week.   12  capsule   3   .  furosemide (LASIX) 20 MG tablet  TAKE 1 TABLET (20 MG TOTAL) BY MOUTH DAILY.   90 tablet   3   .  KLOR-CON M20 20 MEQ tablet  TAKE 1 TABLET EVERY DAY   90 tablet   3   .  metFORMIN (GLUCOPHAGE-XR) 500 MG 24 hr tablet  TAKE 4 TABLETS BY MOUTH EVERY EVENING   360 tablet   4   .  metoprolol succinate (TOPROL-XL) 100 MG 24 hr tablet  TAKE 1 TABLET BY MOUTH EVERY DAY   90 tablet   3   .  metoprolol succinate (TOPROL-XL) 50 MG 24 hr tablet  TAKE 1 TABLET (50 MG TOTAL) BY MOUTH DAILY.  TAKE WITH 100MG  TABLET TO MAKE 150MG    90 tablet   3   .  MICARDIS 20 MG tablet  TAKE 1/2 TABLET BY MOUTH EVERY DAY   30 tablet   1   .  omeprazole (PRILOSEC OTC) 20 MG tablet  Take 20 mg by mouth daily.           .  pioglitazone (ACTOS) 45 MG tablet  TAKE 1 TABLET EVERY DAY   90 tablet   4   .  vitamin B-12 (CYANOCOBALAMIN) 250 MCG tablet  Take 250 mcg by mouth daily. Take 3 times a week         .  warfarin (COUMADIN) 2 MG tablet  TAKE AS DIRECTED BY COUMADIN CLINIC.   30 tablet   1   .  warfarin (COUMADIN) 4 MG tablet  TAKE AS DIRECTED BY THE ANTICOAGULATION CLINIC   30 tablet   3         Past Medical History   Diagnosis  Date   .  Hyperlipidemia     .  Anemia     .  Atrial fibrillation     .  Diverticulitis     .  Valvular heart disease         per HPI   .  CVA (cerebral vascular accident)         s/p CVA felt due to oscillating calcium on aortic valve   .  Carcinoma in situ of vulva     .  Fibroid     .  Ovarian cyst, left  2008-2009   .  Osteoporosis     .  Diabetes mellitus     .  Hypertension     .  GERD (gastroesophageal reflux disease)         Past Surgical History   Procedure  Date   .  Foot surgery         removed neuroma   .  Aortic root replacement         homograft   .  Mv repair         due to severe MR   .  Resection and grafting of ascending thoracic aortic     .  Aneurysm and left sided maze  2008   .  Coronary artery bypass graft  4/10       redone due to occluded LM at implantation site (LIMA to LAD, SVG to OM)   .  Wide excision of vulva         CA INSITU       History       Social History   .  Marital Status:  Married       Spouse  Name:  N/A       Number of Children:  N/A   .  Years of Education:  N/A       Occupational History   .  Not on file.       Social History Main Topics   .  Smoking status:  Former Smoker       Quit date:  03/09/1983   .  Smokeless tobacco:  Never Used   .  Alcohol Use:  No   .  Drug Use:  No   .   Sexually Active:  Yes       Birth Control/ Protection:  Post-menopausal       Other Topics  Concern   .  Not on file       Social History Narrative     No regular exercise      ROS: hematuria but no fevers or chills, productive cough, hemoptysis, dysphasia, odynophagia, melena, hematochezia, dysuria, rash, seizure activity, orthopnea, PND, pedal edema, claudication. Remaining systems are negative.   Physical Exam: Well-developed well-nourished in no acute distress.   Skin is warm and dry.   HEENT is normal.   Neck is supple.   Chest is clear to auscultation with normal expansion.   Cardiovascular exam is regular rate and rhythm. 2/6 systolic murmur left sternal border Abdominal exam nontender or distended. No masses palpated. Right groin with bruit and small palpable pulsatile mass. Extremities show no edema. neuro grossly intact   ECG sinus rhythm at a rate of 94. Occasional PACs. Cannot rule out prior septal infarct.              Congestive dilated cardiomyopathy - [name], MD  03/31/2012  1:04 PM  Signed Continue ARB and beta blocker. Cath results noted. Plan EP evaluation for consideration of ICD. Note she already has epicardial leads in place.        HYPERLIPIDEMIA - [name], MD  03/31/2012  1:05 PM  Signed Management per primary care.  Essential hypertension, benign - [name], MD  03/31/2012  1:06 PM  Signed Continue present medications.  UNSPECIFIED IRON DEFICIENCY ANEMIA - [name], MD  03/31/2012  1:06 PM  Signed I have asked her to followup with primary care for anemia.  Atrial fibrillation - [name], MD  03/31/2012  1:07 PM  Signed Patient remains in sinus rhythm. She did have an episode of atrial fibrillation off of flecainide. She has had recurrent episodes in the past status post maze procedure. Flecainide discontinued because of structural heart disease and worsening LV function. She will most likely need an  antiarrhythmic to maintain sinus rhythm. Best option may be tikosyn which could be initiated during hospitalization for her defibrillator. I will review this with Dr. Rayann Heman.  AORTIC STENOSIS - [name], MD  03/31/2012  1:07 PM  Signed Status post aortic valve replacement. Continue SBE prophylaxis.  Mitral valve disease - [name], MD  03/31/2012  1:08 PM  Signed Status post mitral valve repair. Continue SBE prophylaxis.  Bruit - [name], MD  03/31/2012  1:08 PM  Addendum Patient with pulsatile mass bruit at previous cath site. Schedule ultrasound to exclude pseudoaneurysm.   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 Maximum dimension of the pseudoaneurysm was 1.6 x 1.9 cm. The patient was examined. She has mild tenderness with a pulsatile mass and diffuse ecchymoses. She really hasn't had much pain. Considering her chronic anticoagulation  with warfarin, I'm concerned that she may not have spontaneous closure of her pseudoaneurysm. I have recommended that she come in Monday for ultrasound guided compression. She understands that if she develops acute onset of pain or swelling in the groin that she needs to call 911 and apply pressure over that area.   [name] 03/31/2012 3:21 PM   Pt evaluated on arrival this AM. See my addendum from 1/24. Nothing further to add. For pseudoaneurysm compression with ultrasound today.  [name]

## 2012-04-03 NOTE — CV Procedure (Signed)
Pseudoaneurysm compression by ultrasound guidance performed. Successfully thrombosed. Will return to office later this week for follow-up duplex to confirm complete thrombosis since she is anticoagulated with warfarin.  [name]

## 2012-04-03 NOTE — Progress Notes (Signed)
Right groin ultrasound completed.  Positive for partially thrombosed pseudoaneurysm.  Pseudoaneurysm appears thrombosed post thirty minutes of compression.

## 2012-04-03 NOTE — Progress Notes (Addendum)
Patient presented for ultrasound-guided compression of R groin pseudoaneurysm. On explaining procedure to the patient, she converted from NSR to atrial fibrillation with RVR, rate 150-160 bpm. She was given Lopressor 5mg  IV x 1 with appropriate rate response to 80-100s and conversion to NSR. MSO4 2mg  IV x 2 given for pain. BP 120-130/80-90. She tolerated the procedure well with minimal discomfort. Transducer compression was applied for ~ 30 minutes with resultant loss of color flow within the pseudoaneurysm and neck. Bed rest x ~ 1 hour will be completed. Will see back in the office in 2-3 days for limited u/s to confirm closure.    Jacquelynn Cree, PA-C 04/03/2012 2:40 PM

## 2012-04-04 LAB — GLUCOSE, CAPILLARY: Glucose-Capillary: 125 mg/dL — ABNORMAL HIGH (ref 70–99)

## 2012-04-05 ENCOUNTER — Encounter: Payer: Self-pay | Admitting: Internal Medicine

## 2012-04-05 ENCOUNTER — Encounter (INDEPENDENT_AMBULATORY_CARE_PROVIDER_SITE_OTHER): Payer: Medicare Other

## 2012-04-05 ENCOUNTER — Ambulatory Visit (INDEPENDENT_AMBULATORY_CARE_PROVIDER_SITE_OTHER): Payer: Medicare Other | Admitting: Internal Medicine

## 2012-04-05 VITALS — BP 140/78 | HR 84 | Ht 63.5 in | Wt 166.0 lb

## 2012-04-05 DIAGNOSIS — I4891 Unspecified atrial fibrillation: Secondary | ICD-10-CM

## 2012-04-05 DIAGNOSIS — M79609 Pain in unspecified limb: Secondary | ICD-10-CM | POA: Diagnosis not present

## 2012-04-05 DIAGNOSIS — Z48812 Encounter for surgical aftercare following surgery on the circulatory system: Secondary | ICD-10-CM | POA: Diagnosis not present

## 2012-04-05 DIAGNOSIS — I38 Endocarditis, valve unspecified: Secondary | ICD-10-CM

## 2012-04-05 DIAGNOSIS — I729 Aneurysm of unspecified site: Secondary | ICD-10-CM

## 2012-04-05 NOTE — Patient Instructions (Signed)
Call to let us know if/when you would like to go into the hospital for Tikosyn loading.

## 2012-04-05 NOTE — Progress Notes (Signed)
Primary Care Physician: Scarlette Calico, MD Referring Physician:  Dr Olen Pel [name] is a 72 y.o. female with a h/o severe AS, mitral regurgitation, aneurysm of the ascending thoracic aorta (status post aortic valve replacement, mitral valve repair, resection and grafting of the ascending thoracic aortic aneurysm on Jul 07, 2006).  She has had progressive decline in her EF despite good medical therapy.  She has had afib which was previously controlled with flecainide.  Due to recent declines in EF, flecainide has been discontinued.  She reports increasing irregular palpitations since that time with occasional elevation in heart rates.  Episodes tend to be short lived, lasting only several minutes. She remains reasonably active but reports SOB with moderate activity.  She is unable to keep up with her spouse walking from the parking lot.  She has chronic orthopnea.    A followup echocardiogram was performed in October of 2013. Her ejection fraction was 35-40%. There was mild perivalvular aortic insufficiency. There was mild mitral stenosis and mild residual mitral regurgitation. There was mild to moderate tricuspid regurgitation. Mild biatrial enlargement. MUGA was performed in October of 2013 and showed an ejection fraction of 35%. Because patient's LV function was reduced a Myoview was performed. There was a fixed inferior defect consistent with thinning and a partially reversible anterior defect consistent with prior infarct and mild peri-infarct ischemia. Ejection fraction 33%, global hypokinesis and anteroseptal akinesis. Cardiac catheterization repeated in January of 2014. The left main was occluded. The LAD, circumflex and right coronary artery were patent. The LIMA to the LAD was patent but atretic. Saphenous vein graft to the obtuse marginal was patent. This graft filled the obtuse marginal and ramus intermedius and the entire LAD filled retrograde from this graft. Today, she denies symptoms  of chest pain,  lower extremity edema, dizziness, presyncope, syncope, or neurologic sequela. The patient is tolerating medications without difficulties and is otherwise without complaint today.   Past Medical History  Diagnosis Date  . Hyperlipidemia   . Anemia   . Atrial fibrillation   . Diverticulitis   . Valvular heart disease     per HPI  . CVA (cerebral vascular accident)     s/p CVA felt due to oscillating calcium on aortic valve  . Carcinoma in situ of vulva   . Fibroid   . Ovarian cyst, left 2008-2009  . Osteoporosis   . Diabetes mellitus   . Hypertension   . GERD (gastroesophageal reflux disease)   . Chronic systolic dysfunction of left ventricle    Past Surgical History  Procedure Date  . Foot surgery     removed neuroma  . Aortic root replacement     homograft  . Mv repair     due to severe MR  . Resection and grafting of ascending thoracic aortic   . Aneurysm and left sided maze 2008  . Coronary artery bypass graft 4/10    redone due to occluded LM at implantation site (LIMA to LAD, SVG to OM)  . Wide excision of vulva     CA INSITU    Current Outpatient Prescriptions  Medication Sig Dispense Refill  . Cholecalciferol (VITAMIN D3) 50000 UNITS CAPS Take 1 tablet by mouth once a week.  12 capsule  3  . furosemide (LASIX) 20 MG tablet TAKE 1 TABLET (20 MG TOTAL) BY MOUTH DAILY.  90 tablet  3  . KLOR-CON M20 20 MEQ tablet TAKE 1 TABLET EVERY DAY  90 tablet  3  .  metFORMIN (GLUCOPHAGE-XR) 500 MG 24 hr tablet Take 500-1,500 mg by mouth daily with breakfast. Takes 1 tablet in am and 3 tablets in pm      . metoprolol succinate (TOPROL-XL) 100 MG 24 hr tablet TAKE 1 TABLET BY MOUTH EVERY DAY  90 tablet  3  . metoprolol succinate (TOPROL-XL) 50 MG 24 hr tablet TAKE 1 TABLET (50 MG TOTAL) BY MOUTH DAILY. TAKE WITH 100MG  TABLET TO MAKE 150MG   90 tablet  3  . MICARDIS 20 MG tablet TAKE 1/2 TABLET BY MOUTH EVERY DAY  30 tablet  1  . omeprazole (PRILOSEC OTC) 20 MG tablet  Take 20 mg by mouth daily.        . pioglitazone (ACTOS) 45 MG tablet TAKE 1 TABLET EVERY DAY  90 tablet  4  . vitamin B-12 (CYANOCOBALAMIN) 250 MCG tablet Take 250 mcg by mouth daily. Take 3 times a week      . warfarin (COUMADIN) 2 MG tablet Take 2 mg by mouth 3 (three) times a week. Monday, Wednesday and Friday      . warfarin (COUMADIN) 4 MG tablet Take 4 mg by mouth daily. Tuesday, Thursday, Saturday and Sunday        No Known Allergies  History   Social History  . Marital Status: Married    Spouse Name: N/A    Number of Children: N/A  . Years of Education: N/A   Occupational History  . Not on file.   Social History Main Topics  . Smoking status: Former Smoker    Quit date: 03/09/1983  . Smokeless tobacco: Never Used  . Alcohol Use: No  . Drug Use: No  . Sexually Active: Yes    Birth Control/ Protection: Post-menopausal   Other Topics Concern  . Not on file   Social History Narrative   No regular exercise    Family History  Problem Relation Age of Onset  . Hypertension Mother   . Breast cancer Maternal Aunt     age 69's  . Cancer Paternal Grandmother     COLON    ROS- All systems are reviewed and negative except as per the HPI above  Physical Exam: Filed Vitals:   04/05/12 1112  BP: 140/78  Pulse: 84  Height: 5' 3.5" (1.613 m)  Weight: 166 lb (75.297 kg)    GEN- The patient is well appearing, alert and oriented x 3 today.   Head- normocephalic, atraumatic Eyes-  Sclera clear, conjunctiva pink Ears- hearing intact Oropharynx- clear Neck- supple, + JVD Lungs- Clear to ausculation bilaterally, normal work of breathing Heart- Regular rate and rhythm,  GI- soft, NT, ND, + BS Extremities- no clubbing, cyanosis, or edema MS- no significant deformity or atrophy Skin- no rash or lesion Psych- euthymic mood, full affect Neuro- strength and sensation are intact  EKGs, echos, muga, myoview reviewed  Assessment and Plan:

## 2012-04-10 ENCOUNTER — Ambulatory Visit (INDEPENDENT_AMBULATORY_CARE_PROVIDER_SITE_OTHER): Payer: Medicare Other | Admitting: Pharmacist

## 2012-04-10 DIAGNOSIS — I059 Rheumatic mitral valve disease, unspecified: Secondary | ICD-10-CM

## 2012-04-10 DIAGNOSIS — I4891 Unspecified atrial fibrillation: Secondary | ICD-10-CM

## 2012-04-10 DIAGNOSIS — I359 Nonrheumatic aortic valve disorder, unspecified: Secondary | ICD-10-CM

## 2012-04-10 DIAGNOSIS — Z7901 Long term (current) use of anticoagulants: Secondary | ICD-10-CM

## 2012-04-10 NOTE — Progress Notes (Signed)
[name] has a complex cardiac history including Atrial fibrillation.  She and her husband present today for discussion of Tikosyn initation .   She has previously been on Flecanide for suppression of A-fib.  This was recently stopped due to new structural heart disease and worsening EF 33%.   She is not on any interacting medications.   She is concerned about the future start of Forteo, but this does not interact with Tikosyn.   She is compliant with her current medications.   She is taking furosemide with potassium supplementation and previous K+ level have been > 4.  She has been counseled to call [facility] with any new medications so we can check potential interactions with either Coumadin or Tikosyn.  She has been counseled to call office if she misses more than 2 doses in a row and to take each tablet 12hr apart.  We discussed potential side effects with patient and family including QTc prolongation, nausea and HA. Pt is aware of the importance of compliance.  She also will be having a defibrillator implant during this hospitalization.      We will check her labs for any electrolyte abnormality and correct if needed prior to Tikosyn initiation.  We will also ensure appropriate dose for real function and obtain a baseline EKG.  She has been anticoagulated on Coumadin with INR > 2 except for when it was held for cath earlier in January.   04/19/12 labs Mag 1.1 will admit at replace with 6gm iv x1 magnesium per Dr. Rayann Heman and recheck Mag for goal level 1.8 prior to Tikosyn initiation. K 4.0 Cr 1 which provides CrCl 63ml/min INR 2.7 and has been adequately anticoagulated Coumadin dose 2mg  MWF and 4mg  all other days. EKG shows QTc 398 today in office Tikosyn dose will be 521mcg twice daily Pt copay will be $70 pt aware and agrees.  Pt [facility] - [facility] [address] -does not carry Tikosyn, but [facility]l go [facility] [address] who [facility].

## 2012-04-11 DIAGNOSIS — R310 Gross hematuria: Secondary | ICD-10-CM | POA: Diagnosis not present

## 2012-04-19 ENCOUNTER — Encounter (HOSPITAL_COMMUNITY): Payer: Self-pay | Admitting: Cardiology

## 2012-04-19 ENCOUNTER — Inpatient Hospital Stay (HOSPITAL_COMMUNITY)
Admission: AD | Admit: 2012-04-19 | Discharge: 2012-04-22 | DRG: 309 | Disposition: A | Discharge status: Home / Self Care | Payer: Medicare Other | Source: Ambulatory Visit | Attending: Cardiovascular Disease | Admitting: Cardiovascular Disease

## 2012-04-19 ENCOUNTER — Ambulatory Visit (INDEPENDENT_AMBULATORY_CARE_PROVIDER_SITE_OTHER): Payer: Medicare Other

## 2012-04-19 ENCOUNTER — Other Ambulatory Visit (INDEPENDENT_AMBULATORY_CARE_PROVIDER_SITE_OTHER): Payer: Medicare Other

## 2012-04-19 DIAGNOSIS — Z8249 Family history of ischemic heart disease and other diseases of the circulatory system: Secondary | ICD-10-CM

## 2012-04-19 DIAGNOSIS — M1990 Unspecified osteoarthritis, unspecified site: Secondary | ICD-10-CM | POA: Diagnosis present

## 2012-04-19 DIAGNOSIS — I10 Essential (primary) hypertension: Secondary | ICD-10-CM | POA: Diagnosis present

## 2012-04-19 DIAGNOSIS — I4892 Unspecified atrial flutter: Secondary | ICD-10-CM | POA: Diagnosis present

## 2012-04-19 DIAGNOSIS — E876 Hypokalemia: Secondary | ICD-10-CM | POA: Diagnosis present

## 2012-04-19 DIAGNOSIS — Z87891 Personal history of nicotine dependence: Secondary | ICD-10-CM

## 2012-04-19 DIAGNOSIS — I255 Ischemic cardiomyopathy: Secondary | ICD-10-CM

## 2012-04-19 DIAGNOSIS — I4891 Unspecified atrial fibrillation: Secondary | ICD-10-CM | POA: Diagnosis not present

## 2012-04-19 DIAGNOSIS — M810 Age-related osteoporosis without current pathological fracture: Secondary | ICD-10-CM | POA: Diagnosis present

## 2012-04-19 DIAGNOSIS — Z79899 Other long term (current) drug therapy: Secondary | ICD-10-CM | POA: Diagnosis not present

## 2012-04-19 DIAGNOSIS — Z8673 Personal history of transient ischemic attack (TIA), and cerebral infarction without residual deficits: Secondary | ICD-10-CM

## 2012-04-19 DIAGNOSIS — Z951 Presence of aortocoronary bypass graft: Secondary | ICD-10-CM

## 2012-04-19 DIAGNOSIS — I2510 Atherosclerotic heart disease of native coronary artery without angina pectoris: Secondary | ICD-10-CM | POA: Diagnosis present

## 2012-04-19 DIAGNOSIS — E119 Type 2 diabetes mellitus without complications: Secondary | ICD-10-CM | POA: Diagnosis present

## 2012-04-19 DIAGNOSIS — Z954 Presence of other heart-valve replacement: Secondary | ICD-10-CM

## 2012-04-19 DIAGNOSIS — Z7901 Long term (current) use of anticoagulants: Secondary | ICD-10-CM | POA: Diagnosis not present

## 2012-04-19 DIAGNOSIS — K219 Gastro-esophageal reflux disease without esophagitis: Secondary | ICD-10-CM | POA: Diagnosis present

## 2012-04-19 DIAGNOSIS — Z8544 Personal history of malignant neoplasm of other female genital organs: Secondary | ICD-10-CM | POA: Diagnosis not present

## 2012-04-19 DIAGNOSIS — D509 Iron deficiency anemia, unspecified: Secondary | ICD-10-CM | POA: Diagnosis present

## 2012-04-19 DIAGNOSIS — I509 Heart failure, unspecified: Secondary | ICD-10-CM | POA: Diagnosis present

## 2012-04-19 DIAGNOSIS — I2582 Chronic total occlusion of coronary artery: Secondary | ICD-10-CM | POA: Diagnosis present

## 2012-04-19 DIAGNOSIS — I2589 Other forms of chronic ischemic heart disease: Secondary | ICD-10-CM | POA: Diagnosis not present

## 2012-04-19 DIAGNOSIS — I5022 Chronic systolic (congestive) heart failure: Secondary | ICD-10-CM | POA: Diagnosis not present

## 2012-04-19 DIAGNOSIS — I38 Endocarditis, valve unspecified: Secondary | ICD-10-CM | POA: Diagnosis present

## 2012-04-19 HISTORY — DX: Unspecified osteoarthritis, unspecified site: M19.90

## 2012-04-19 HISTORY — DX: Unspecified fracture of unspecified lumbar vertebra, initial encounter for closed fracture: S32.009A

## 2012-04-19 HISTORY — DX: Chronic systolic (congestive) heart failure: I50.22

## 2012-04-19 HISTORY — DX: Atherosclerotic heart disease of native coronary artery without angina pectoris: I25.10

## 2012-04-19 HISTORY — DX: Ischemic cardiomyopathy: I25.5

## 2012-04-19 LAB — BASIC METABOLIC PANEL
BUN: 16 mg/dL (ref 6–23)
Chloride: 104 mEq/L (ref 96–112)
Glucose, Bld: 126 mg/dL — ABNORMAL HIGH (ref 70–99)
Potassium: 4 mEq/L (ref 3.5–5.1)

## 2012-04-19 LAB — MAGNESIUM: Magnesium: 3.3 mg/dL — ABNORMAL HIGH (ref 1.5–2.5)

## 2012-04-19 LAB — GLUCOSE, CAPILLARY

## 2012-04-19 MED ORDER — METFORMIN HCL ER 500 MG PO TB24: 500.0000 mg | ORAL_TABLET | Freq: Two times a day (BID) | ORAL | Status: DC

## 2012-04-19 MED ORDER — METFORMIN HCL ER 750 MG PO TB24
1500.0000 mg | ORAL_TABLET | Freq: Every day | ORAL | Status: DC
  Administered 2012-04-19 – 2012-04-21 (×3): 1500 mg via ORAL
  Filled 2012-04-19 (×4): qty 2

## 2012-04-19 MED ORDER — SODIUM CHLORIDE 0.9 % IJ SOLN
3.0000 mL | Freq: Two times a day (BID) | INTRAMUSCULAR | Status: DC
  Administered 2012-04-20: 3 mL via INTRAVENOUS
  Administered 2012-04-21: 11:00:00 via INTRAVENOUS
  Administered 2012-04-21 – 2012-04-22 (×2): 3 mL via INTRAVENOUS

## 2012-04-19 MED ORDER — DOFETILIDE 500 MCG PO CAPS
500.0000 ug | ORAL_CAPSULE | Freq: Two times a day (BID) | ORAL | Status: DC
  Administered 2012-04-19: 500 ug via ORAL
  Filled 2012-04-19 (×4): qty 1

## 2012-04-19 MED ORDER — METOPROLOL SUCCINATE ER 100 MG PO TB24
100.0000 mg | ORAL_TABLET | Freq: Every morning | ORAL | Status: DC
  Administered 2012-04-20 – 2012-04-22 (×3): 100 mg via ORAL
  Filled 2012-04-19 (×3): qty 1

## 2012-04-19 MED ORDER — SODIUM CHLORIDE 0.9 % IJ SOLN: 3.0000 mL | INTRAMUSCULAR | Status: DC | PRN

## 2012-04-19 MED ORDER — METFORMIN HCL ER 500 MG PO TB24
500.0000 mg | ORAL_TABLET | Freq: Every day | ORAL | Status: DC
  Administered 2012-04-20 – 2012-04-22 (×3): 500 mg via ORAL
  Filled 2012-04-19 (×4): qty 1

## 2012-04-19 MED ORDER — FUROSEMIDE 20 MG PO TABS
20.0000 mg | ORAL_TABLET | Freq: Every morning | ORAL | Status: DC
  Administered 2012-04-20 – 2012-04-22 (×3): 20 mg via ORAL
  Filled 2012-04-19 (×3): qty 1

## 2012-04-19 MED ORDER — METOPROLOL SUCCINATE ER 50 MG PO TB24
50.0000 mg | ORAL_TABLET | Freq: Every morning | ORAL | Status: DC
Start: 2012-04-20 — End: 2012-04-22
  Administered 2012-04-20 – 2012-04-22 (×3): 50 mg via ORAL
  Filled 2012-04-19 (×3): qty 1

## 2012-04-19 MED ORDER — MAGNESIUM SULFATE 50 % IJ SOLN: 6.0000 g | Freq: Once | INTRAVENOUS | Status: DC

## 2012-04-19 MED ORDER — MAGNESIUM SULFATE 50 % IJ SOLN
6.0000 g | Freq: Once | INTRAVENOUS | Status: AC
  Administered 2012-04-19: 6 g via INTRAVENOUS
  Filled 2012-04-19: qty 12

## 2012-04-19 MED ORDER — VITAMIN B-12 1000 MCG PO TABS
2500.0000 ug | ORAL_TABLET | ORAL | Status: DC
  Filled 2012-04-19: qty 1

## 2012-04-19 MED ORDER — IRBESARTAN 75 MG PO TABS
37.5000 mg | ORAL_TABLET | Freq: Every day | ORAL | Status: DC
  Administered 2012-04-20 – 2012-04-22 (×3): 37.5 mg via ORAL
  Filled 2012-04-19 (×3): qty 0.5

## 2012-04-19 MED ORDER — VITAMIN B-12 2500 MCG SL SUBL: 1.0000 | SUBLINGUAL_TABLET | SUBLINGUAL | Status: DC

## 2012-04-19 MED ORDER — POTASSIUM CHLORIDE CRYS ER 20 MEQ PO TBCR
20.0000 meq | EXTENDED_RELEASE_TABLET | Freq: Every morning | ORAL | Status: DC
  Administered 2012-04-20: 20 meq via ORAL
  Filled 2012-04-19 (×4): qty 1

## 2012-04-19 MED ORDER — SODIUM CHLORIDE 0.9 % IV SOLN: 250.0000 mL | INTRAVENOUS | Status: DC | PRN

## 2012-04-19 MED ORDER — PIOGLITAZONE HCL 45 MG PO TABS
45.0000 mg | ORAL_TABLET | Freq: Every morning | ORAL | Status: DC
  Administered 2012-04-20 – 2012-04-22 (×3): 45 mg via ORAL
  Filled 2012-04-19 (×3): qty 1

## 2012-04-19 NOTE — H&P (Signed)
History and Physical  Patient ID: [name] MRN: [id number], SOB: August 17, 1940 72 y.o. Date of Encounter: 04/19/2012, 1:20 PM  Primary Physician: Scarlette Calico, MD Primary Cardiologist: Dr. Stanford Breed Primary Electrophysiologist: Dr. Rayann Heman  Chief Complaint: Tikosyn load and ICD implantation  HPI: 72 y.o. female w/ PMHx significant for bicuspid AV/severe AS, mitral regurgitation, & aneurysm of the ascending thoracic aorta (s/p AV replacement, MV repair, resection and grafting of the ascending thoracic aortic aneurysm 2008), CAD 2/2 occluded LM at site of reimplantation (s/p 2v CABG [id number]), Chronic Systolic CHF/ICM (EF [id number]), PAFib/flutter (s/p MAZE 2008, [place] 2011, on coumadin),  HTN, DMII, Fe Def Anemia, and CVA 2008 who presented to [facility] on 04/19/2012 for Tikosyn loading and ICD implantation.  She has a long complicated cardiac history including Paroxysmal A.Fib/Flutter s/p MAZE in 2008 and DCCV in 2011. She was previously controlled on Flecainide therapy, but this was discontinued due to worsening LV function and she has had recurrent episodes of symptomatic PAF. She has had a progressive decline in her EF and worsening heart failure symptoms despite good medical therapy. She was evaluated by Dr. Stanford Breed and Dr. Rayann Heman and felt to be a good candidate for antiarrhythmic therapy with Tikosyn. It is also felt she would benefit from ICD implantation for primary prevention of SCD.   [name] presented to [facility] today in stable condition. She reports intermittent palpitations and stable SOB/DOE/LE edema. No chest pain, presycnope/syncope, melena/hematochezia, or n/v/d. EKG shows sinus rhythm with sinus arrhythmia and QTc 350ms. Telemetry shows sinus rhythm with PAF 70-100s. Labs are significant for INR 2.7, Mg 1.1, K+ 4.0, Cr 1.0, CrCl 64mL/min.    Past Medical History  Diagnosis Date  . Hyperlipidemia   . Anemia   . Atrial fibrillation     A.fib/flutter: s/p MAZE 2008, DCCV 2011,  on coumadin; previously on flecainide, but dc'd due to worsening EF  . Diverticulitis   . Valvular heart disease     bicuspid AV/severe AS, mitral regurgitation, & aneurysm of the ascending thoracic aorta (s/p AV replacement, MV repair, resection and grafting of the ascending thoracic aortic aneurysm 2008  . CVA (cerebral vascular accident)     2008 - felt due to oscillating calcium on aortic valve  . Carcinoma in situ of vulva   . Fibroid   . Ovarian cyst, left 2008-2009  . Osteoporosis   . Diabetes mellitus   . Hypertension   . GERD (gastroesophageal reflux disease)   . Chronic systolic CHF (congestive heart failure)     EF 35%  . Ischemic cardiomyopathy     EF 35%  . Coronary artery disease     s/p 2v CABG 2010 LIMA to LAD, SVG to OM; Cath 03/11/12 patent SVG to OM, atretic LIMA to LAD, patent RCA, occluded native LM, EF 35%     03/21/12 - Cardiac Cath Hemodynamics:  AO 138/67  LV 140/50  Coronary angiography:  Coronary dominance: right  Left mainstem: Total occlusion at the ostium  Left anterior descending (LAD): Patent to the left ventricular apex. Fills entirely from retrograde flow supplied by the saphenous vein graft obtuse marginal  Left circumflex (LCx): The left circumflex and its branch vessels are patent. They fill entirely from the saphenous vein graft obtuse marginal  Right coronary artery (RCA): This is a very large, dominant vessel. The vessel is smooth throughout its course without any significant obstructive disease. There is a large PDA branch and a large posterolateral branch, both of which  are patent.  LIMA to LAD: Patent but atretic  Saphenous vein graft to obtuse marginal: Widely patent throughout. The coronary anastomotic site is widely patent. This fills the obtuse marginal/ramus intermedius and antegrade fashion. The entire LAD fills retrograde from this graft. The AV groove circumflex also fills retrograde and supplies 2 more distal obtuse marginal branches.    Left ventriculography: The mid anterior wall is severely hypokinetic. The apex is severely hypokinetic. The inferior wall is mildly hypokinetic. Left ventricular ejection fraction is estimated at 35%.  Final Conclusions:  1. Total occlusion of the native left mainstem  2. Wide patency of the right coronary artery  3. Atretic LIMA to LAD  4. Widely patent saphenous vein graft to obtuse marginal supplying all branches of the left coronary artery  5. Severe segmental left ventricular systolic dysfunction  [id number] - Echo Study Conclusions:  - Left ventricle: The cavity size was mildly dilated. Systolic function was moderately reduced. The estimated ejection fraction was in the range of 35% to 40%. Diffuse hypokinesis. - Aortic valve: Tissue AVR not well seen Systolic gradients normal. Mild perivalvular ? valvular regurgitation - Mitral valve: S/P mitral repair. Repair is a bit "tight" with mild residual stenosis and mild residual MR - Left atrium: The atrium was mildly dilated. - Right atrium: The atrium was mildly dilated. - Atrial septum: No defect or patent foramen ovale was identified. - Tricuspid valve: Mild-moderate regurgitation. - Pulmonary arteries: PA peak pressure: 65mm Hg (S).  Surgical History:  Past Surgical History  Procedure Laterality Date  . Foot surgery      removed neuroma  . Aortic root replacement  2008    homograft  . Mv repair  2008    due to severe MR  . Resection and grafting of ascending thoracic aortic    . Aneurysm and left sided maze  2008  . Coronary artery bypass graft  4/10    LIMA to LAD, SVG to OM  . Wide excision of vulva      CA INSITU     Home Meds: Medication Sig  Cholecalciferol (VITAMIN D3) 50000 UNITS CAPS Take 1 tablet by mouth once a week.  furosemide (LASIX) 20 MG tablet TAKE 1 TABLET (20 MG TOTAL) BY MOUTH DAILY.  KLOR-CON M20 20 MEQ tablet TAKE 1 TABLET EVERY DAY  metFORMIN (GLUCOPHAGE-XR) 500 MG 24 hr tablet Take 500-1,500 mg by  mouth daily with breakfast. Takes 1 tablet in am and 3 tablets in pm  metoprolol succinate (TOPROL-XL) 100 MG 24 hr tablet TAKE 1 TABLET BY MOUTH EVERY DAY  metoprolol succinate (TOPROL-XL) 50 MG 24 hr tablet TAKE 1 TABLET (50 MG TOTAL) BY MOUTH DAILY. TAKE WITH 100MG  TABLET TO MAKE 150MG   MICARDIS 20 MG tablet TAKE 1/2 TABLET BY MOUTH EVERY DAY  omeprazole (PRILOSEC OTC) 20 MG tablet Take 20 mg by mouth daily.    pioglitazone (ACTOS) 45 MG tablet TAKE 1 TABLET EVERY DAY  vitamin B-12 (CYANOCOBALAMIN) 250 MCG tablet Take 250 mcg by mouth daily. Take 3 times a week  warfarin (COUMADIN) 2 MG tablet Take 2 mg by mouth 3 (three) times a week. Monday, Wednesday and Friday  warfarin (COUMADIN) 4 MG tablet Take 4 mg by mouth daily. Tuesday, Thursday, Saturday and Sunday    Allergies: No Known Allergies  History   Social History  . Marital Status: Married    Spouse Name: N/A    Number of Children: N/A  . Years of Education: N/A   Occupational History  .  Not on file.   Social History Main Topics  . Smoking status: Former Smoker    Quit date: 03/09/1983  . Smokeless tobacco: Never Used  . Alcohol Use: No  . Drug Use: No  . Sexually Active: Yes    Birth Control/ Protection: Post-menopausal   Other Topics Concern  . Not on file   Social History Narrative   No regular exercise     Family History  Problem Relation Age of Onset  . Hypertension Mother   . Breast cancer Maternal Aunt     age 35's  . Cancer Paternal Grandmother     COLON    Review of Systems: General: negative for chills, fever, night sweats or weight changes.  Cardiovascular: (+) shortness of breath, dyspnea on exertion, LE edema, palpitations; negative for chest pain, orthopnea, paroxysmal nocturnal dyspnea  Dermatological: negative for rash Respiratory: negative for cough or wheezing Urologic: negative for hematuria Abdominal: negative for nausea, vomiting, diarrhea, bright red blood per rectum, melena, or  hematemesis Neurologic: negative for visual changes, syncope, or dizziness All other systems reviewed and are otherwise negative except as noted above.  Labs:   Lab 04/19/12 0811  NA 138  K 4.0  CL 104  CO2 26  BUN 16  CREATININE 1.0  CALCIUM 9.5  GLUCOSE 126*  MAGNESIUM 1.1*      04/19/2012 08:11  Prothrombin Time 27.6 (H)  INR 2.7 (H)   Radiology/Studies:  None   EKG: 04/19/12 @ 1316 - sinus rhythm with sinus arrhythmia and QTc 356ms  Physical Exam: Blood pressure 137/73, pulse 108, temperature 98.3 F (36.8 C), resp. rate 18, height 5\' 3"  (1.6 m), weight 165 lb 3.2 oz (74.934 kg), SpO2 97.00%. General: Well developed, elderly white female, in no acute distress. Head: Normocephalic, atraumatic, sclera non-icteric, nares are without discharge Neck: Supple. Negative for carotid bruits. JVD not elevated. Lungs: Clear bilaterally to auscultation without wheezes, rales, or rhonchi. Breathing is unlabored. Heart: RRR with S1 S2. 2/6 systolic murmur lLSB.  No rubs, or gallops appreciated. Abdomen: Soft, non-tender, non-distended with normoactive bowel sounds. No rebound/guarding. No obvious abdominal masses. Msk:  Strength and tone appear normal for age. Extremities: Trace BLE edema. No clubbing or cyanosis. Distal pedal pulses are intact and equal bilaterally. Neuro: Alert and oriented X 3. Moves all extremities spontaneously. Psych:  Responds to questions appropriately with a normal affect.    ASSESSMENT AND PLAN:  72 y.o. female w/ PMHx significant for bicuspid AV/severe AS, mitral regurgitation, & aneurysm of the ascending thoracic aorta (s/p AV replacement, MV repair, resection and grafting of the ascending thoracic aortic aneurysm 2008), CAD 2/2 occluded LM at site of reimplantation (s/p 2v CABG [id number]), Chronic Systolic CHF/ICM (EF [id number]), PAFib/flutter (s/p MAZE 2008, [place] 2011, on coumadin),  HTN, DMII, Fe Def Anemia, and CVA 2008 who presented to [facility] on  04/19/2012 for Tikosyn loading and ICD implantation.  1. Paroxysmal Atrial Fibrillation/Atrial Flutter 2. Chronic Systolic CHF/ICM, EF [id number] 3. Coronary Artery Disease, 2/2 occluded LM at site of reimplantation, s/p 2v CABG  4. H/o Bicuspid AV w/ AV replacement, MV repair, and aortic root repair 2008 5. Chronic Anticoagulation w/ Coumadin, INR 2.7 6. Chronic Fe Deficiency Anemia  7. Hypertension 8. Diabetes Mellitus, Type 2  Patient presents for Tikosyn loading and planned ICD implantation on 04/21/12. She is currently in stable condition without complaints. Per outpatient note Dr. Rayann Heman has requested 6gm IV Magnesium and then repeat Mg level prior to Tikosyn initiation. QTc,  K+, INR, and CrCl ok. Once Mg level in appropriate range will start on Tikosyn 579mcg BID with close monitoring of QTc and electrolytes. Cont Metoprolol and coumadin and monitor INR. Volume status is stable. Will cont oral Lasix and supplement K+ and Mg as needed. BP stable, continue antihypertensives. Cont oral hypoglycemics and add SSI. Further plans for ICD implantation per EP.   Signed, HOPE, JESSICA PA-C 04/19/2012, 1:20 PM  Attending Note:   The patient was seen and examined.  Agree with assessment and plan as noted above.  Pt has had PAF for years - now is not a candidate for Flecainide due to worsening LV function.  Dr.s Allred and Crenshaw have decided to use Tikosyn.  She needs Mag supplementation.    Otherwise, she fits the protocol to start tikosyn.    Thayer Headings, Brooke Bonito., MD, [facility] 04/19/2012, 2:37 PM

## 2012-04-19 NOTE — Progress Notes (Signed)
ANTICOAGULATION CONSULT NOTE - Initial Consult  Pharmacy Consult for coumadin Indication: afib  No Known Allergies  Patient Measurements: Height: 5\' 3"  (160 cm) Weight: 165 lb 3.2 oz (74.934 kg) IBW/kg (Calculated) : 52.4   Vital Signs: Temp: 98.3 F (36.8 C) (02/12 1251) BP: 137/73 mmHg (02/12 1251) Pulse Rate: 108 (02/12 1252)  Labs:  Recent Labs  04/19/12 0811  LABPROT 27.6*  INR 2.7*  CREATININE 1.0    Estimated Creatinine Clearance: 50.7 ml/min (by C-G formula based on Cr of 1).    Medical History: Past Medical History  Diagnosis Date  . Anemia   . Atrial fibrillation     A.fib/flutter: s/p MAZE 2008, DCCV 2011, on coumadin; previously on flecainide, but dc'd due to worsening EF  . Diverticulitis   . Valvular heart disease     bicuspid AV/severe AS, mitral regurgitation, & aneurysm of the ascending thoracic aorta (s/p AV replacement, MV repair, resection and grafting of the ascending thoracic aortic aneurysm 2008  . CVA (cerebral vascular accident)     2008 - felt due to oscillating calcium on aortic valve  . Carcinoma in situ of vulva   . Fibroid   . Ovarian cyst, left 2008-2009  . Osteoporosis   . Diabetes mellitus   . Hypertension   . GERD (gastroesophageal reflux disease)   . Chronic systolic CHF (congestive heart failure)     EF 35%  . Ischemic cardiomyopathy     EF 35%  . Coronary artery disease     Occluded LM 2/2 previous aortic root surgery; s/p 2v CABG 2010 LIMA to LAD, SVG to OM; Cath 03/11/12 patent SVG to OM, atretic LIMA to LAD, patent RCA, occluded native LM, EF 35%     Medications:  Prescriptions prior to admission  Medication Sig Dispense Refill  . Cholecalciferol (VITAMIN D3) 50000 UNITS CAPS Take 1 tablet by mouth once a week.  12 capsule  3  . Cyanocobalamin (VITAMIN B-12) 2500 MCG SUBL Place 1 tablet under the tongue 3 (three) times a week. Dissolves 1 tablet under the tongue on Monday, Wednesday, and Friday      . furosemide  (LASIX) 20 MG tablet Take 20 mg by mouth every morning.      . metFORMIN (GLUCOPHAGE-XR) 500 MG 24 hr tablet Take 500-1,500 mg by mouth 2 (two) times daily with a meal. Takes 1 tablet in am and 3 tablets in pm      . metoprolol succinate (TOPROL-XL) 100 MG 24 hr tablet Take 100 mg by mouth every morning.       . metoprolol succinate (TOPROL-XL) 50 MG 24 hr tablet Take 50 mg by mouth every morning. Take with or immediately following a meal.      . omeprazole (PRILOSEC OTC) 20 MG tablet Take 20 mg by mouth daily after breakfast.       . pioglitazone (ACTOS) 45 MG tablet Take 45 mg by mouth every morning.      . potassium chloride SA (K-DUR,KLOR-CON) 20 MEQ tablet Take 20 mEq by mouth every morning.      Marland Kitchen telmisartan (MICARDIS) 20 MG tablet Take 10 mg by mouth every morning.       . warfarin (COUMADIN) 2 MG tablet Take 2 mg by mouth 3 (three) times a week. Monday, Wednesday and Friday      . warfarin (COUMADIN) 4 MG tablet Take 4 mg by mouth 4 (four) times a week. Tuesday, Thursday, Saturday and Sunday      . [  DISCONTINUED] metoprolol succinate (TOPROL-XL) 100 MG 24 hr tablet TAKE 1 TABLET BY MOUTH EVERY DAY  90 tablet  3    Assessment: 72 yo female with PAF here for Tikosyn loading on coumadin PTA (patient also noted with bioprosthetic AVR and MV repair).  INR today is 2.7 and at goall with home coumadn regimen of 4mg  TTSS and 2mg  MWF (last dose taken on 2/11).   Goal of Therapy:  INR 2-3 Monitor platelets by anticoagulation protocol: Yes   Plan:  -Continue coumadin as taken at home -Daily PT/INR for now  Hildred Laser, Pharm D 04/19/2012 3:13 PM

## 2012-04-19 NOTE — Progress Notes (Signed)
[name] presents for an ekg pre Tikosyn.  EKG was done and given to [place], the pharm d.  Pt went home and will await a call from [facility] to come in for Tikosyn initiation.

## 2012-04-19 NOTE — Progress Notes (Addendum)
Pharmacy Consult for Dofetilide (Tikosyn) Iniation  Admit Complaint: 72 y.o. female admitted 04/19/2012 with atrial fibrillation to be initiated on dofetilide.   Assessment:  Patient Exclusion Criteria: If any screening criteria checked as "Yes", then  patient  should NOT receive dofetilide until criteria item is corrected. If "Yes" please indicate correction plan.  -Magnesium= 1.1: Patient for 6gm replacement and repeat Mg tonight prior to dosing.  YES  NO Patient  Exclusion Criteria Correction Plan  []  [x]  Baseline QTc interval is greater than or equal to 440 msec. IF above YES box checked dofetilide contraindicated unless patient has ICD; then may proceed if QTc 500-550 msec or with known ventricular conduction abnormalities may proceed with QTc 550-600 msec. QTc = 431   [x]  []  Magnesium level is less than 1.8 mEq/l : Last magnesium:  Lab Results  Component Value Date   MG 1.1* 04/19/2012         []  [x]  Potassium level is less than 4 mEq/l : Last potassium:  Lab Results  Component Value Date   K 4.0 04/19/2012         []  [x]  Patient is known or suspected to have a digoxin level greater than 2 ng/ml: No results found for this basename: DIGOXIN      []  [x]  Creatinine clearance less than 20 ml/min (calculated using Cockcroft-Gault, actual body weight and serum creatinine): CrCl is estimated at about 60 ml/min using actual body Weight calculations.     []  [x]  Patient has received drugs known to prolong the QT intervals within the last 48 hours(phenothiazines, tricyclics or tetracyclic antidepressants, erythromycin, H-1 antihistamines, cisapride, fluoroquinolones, azithromycin). Drugs not listed above may have an, as yet, undetected potential to prolong the QT interval, updated information on QT prolonging agents is available at this website:QT prolonging agents   []  [x]  Patient received a dose of hydrochlorothiazide (Oretic) alone or in any combination including triamterene  (Dyazide, Maxzide) in the last 48 hours.   []  [x]  Patient received a medication known to increase dofetilide plasma concentrations prior to initial dofetilide dose:    Trimethoprim (Primsol, Proloprim) in the last 36 hours   Verapamil (Calan, Verelan) in the last 36 hours or a sustained release dose in the last 72 hours   Megestrol (Megace) in the last 5 days    Cimetidine (Tagamet) in the last 6 hours   Ketoconazole (Nizoral) in the last 24 hours   Itraconazole (Sporanox) in the last 48 hours    Prochlorperazine (Compazine) in the last 36 hours    []  [x]  Patient is known to have a history of torsades de pointes; congenital or acquired long QT syndromes.   []  [x]  Patient has received a Class 1 antiarrhythmic with less than 2 half-lives since last dose. (Disopyramide, Quinidine, Procainamide, Lidocaine, Mexiletine, Flecainide, Propafenone)   []  [x]  Patient has received amiodarone therapy in the past 3 months or amiodarone level is greater than 0.3 ng/ml.    Patient has been appropriately anticoagulated with Coumadin.  Ordering provider was confirmed at [email or web address] if they are not listed on the [place] Prescribers list.  Goal of Therapy:  Follow renal function, electrolytes, potential drug interactions, and dose adjustment. Provide education and 1 week supply at discharge.  Plan:  1.  Initiate dofetilide based on renal function: [facility] One Calculated CrCl  Dose q12h  [x]  > 60 ml/min 500 mcg  []  40-60 ml/min 250 mcg  []  20-40 ml/min 125 mcg   2. Follow up QTc after the first  5 doses, renal function, electrolytes (K & Mg) daily x 3 days, dose adjustment, success of initiation and facilitate 1 week discharge supply as clinically indicated.  3. Initiate Tikosyn education video (Call [phone] and ask for video # [phone]).  4. Place Enrollment Form on the chart for discharge supply of dofetilide.  Hildred Laser, Pharm D 04/19/2012 3:18 PM

## 2012-04-20 ENCOUNTER — Encounter (HOSPITAL_COMMUNITY): Payer: Self-pay | Admitting: General Practice

## 2012-04-20 DIAGNOSIS — I4891 Unspecified atrial fibrillation: Secondary | ICD-10-CM | POA: Diagnosis not present

## 2012-04-20 DIAGNOSIS — I38 Endocarditis, valve unspecified: Secondary | ICD-10-CM | POA: Diagnosis present

## 2012-04-20 LAB — GLUCOSE, CAPILLARY
Glucose-Capillary: 104 mg/dL — ABNORMAL HIGH (ref 70–99)
Glucose-Capillary: 100 mg/dL — ABNORMAL HIGH (ref 70–99)
Glucose-Capillary: 102 mg/dL — ABNORMAL HIGH (ref 70–99)

## 2012-04-20 LAB — BASIC METABOLIC PANEL
BUN: 16 mg/dL (ref 6–23)
CO2: 26 mEq/L (ref 19–32)
Chloride: 101 mEq/L (ref 96–112)
Creatinine, Ser: 1.22 mg/dL — ABNORMAL HIGH (ref 0.50–1.10)
Glucose, Bld: 94 mg/dL (ref 70–99)

## 2012-04-20 MED ORDER — WARFARIN SODIUM 6 MG PO TABS
6.0000 mg | ORAL_TABLET | Freq: Once | ORAL | Status: DC
  Filled 2012-04-20: qty 1

## 2012-04-20 MED ORDER — MAGNESIUM SULFATE 50 % IJ SOLN
4.0000 g | Freq: Once | INTRAVENOUS | Status: DC
  Filled 2012-04-20: qty 8

## 2012-04-20 MED ORDER — DOFETILIDE 250 MCG PO CAPS
375.0000 ug | ORAL_CAPSULE | Freq: Two times a day (BID) | ORAL | Status: DC
  Filled 2012-04-20 (×3): qty 1

## 2012-04-20 MED ORDER — MAGNESIUM OXIDE 400 (241.3 MG) MG PO TABS
400.0000 mg | ORAL_TABLET | Freq: Two times a day (BID) | ORAL | Status: DC
  Administered 2012-04-21 – 2012-04-22 (×3): 400 mg via ORAL
  Filled 2012-04-20 (×4): qty 1

## 2012-04-20 MED ORDER — DOFETILIDE 250 MCG PO CAPS
250.0000 ug | ORAL_CAPSULE | Freq: Two times a day (BID) | ORAL | Status: DC
  Administered 2012-04-20 (×2): 250 ug via ORAL
  Filled 2012-04-20 (×5): qty 1

## 2012-04-20 MED ORDER — WARFARIN SODIUM 4 MG PO TABS
4.0000 mg | ORAL_TABLET | Freq: Once | ORAL | Status: AC
  Administered 2012-04-20: 4 mg via ORAL
  Filled 2012-04-20: qty 1

## 2012-04-20 MED ORDER — POTASSIUM CHLORIDE CRYS ER 20 MEQ PO TBCR
40.0000 meq | EXTENDED_RELEASE_TABLET | Freq: Once | ORAL | Status: AC
  Administered 2012-04-20: 40 meq via ORAL
  Filled 2012-04-20: qty 2

## 2012-04-20 MED ORDER — WARFARIN SODIUM 4 MG PO TABS
4.0000 mg | ORAL_TABLET | ORAL | Status: DC
  Filled 2012-04-20: qty 1

## 2012-04-20 MED ORDER — MAGNESIUM SULFATE 40 MG/ML IJ SOLN
4.0000 g | Freq: Once | INTRAMUSCULAR | Status: AC
  Administered 2012-04-20: 4 g via INTRAVENOUS
  Filled 2012-04-20: qty 100

## 2012-04-20 MED ORDER — WARFARIN SODIUM 2 MG PO TABS
2.0000 mg | ORAL_TABLET | ORAL | Status: DC
  Filled 2012-04-20: qty 1

## 2012-04-20 MED ORDER — SPIRONOLACTONE 12.5 MG HALF TABLET
12.5000 mg | ORAL_TABLET | Freq: Every day | ORAL | Status: DC
  Administered 2012-04-20 – 2012-04-22 (×3): 12.5 mg via ORAL
  Filled 2012-04-20 (×3): qty 1

## 2012-04-20 MED ORDER — WARFARIN - PHARMACIST DOSING INPATIENT: Freq: Every day | Status: DC

## 2012-04-20 NOTE — Clinical Documentation Improvement (Signed)
CKD DOCUMENTATION CLARIFICATION QUERY   THIS DOCUMENT IS NOT A PERMANENT PART OF THE MEDICAL RECORD   Please update your documentation within the medical record to reflect your response to this query.                                                                                         04/20/12   Dr. Caryl Comes and/or Associates,  In a better effort to capture your patient's severity of illness, reflect appropriate length of stay and utilization of resources, a review of the patient medical record has revealed the following indicators:  GFR  (white female) per [facility] Review back to 2010 40's to 60's  Known history of DM2, Chronic Systolic Heart Failure, Diuretic Use, Coronary & Peripheral Artery Dz.  "...GFR borderline decrease tikosyn>> am not sure she will even tolerate the 250 given the magnitude of the delta overnight, although the electrolyte issue maybe contributing somewhat" Dr. Caryl Comes Progress Note  04/20/12  Estimated Creatinine Clearance: 41.6 ml/min (by C-G formula based on Cr of 1.22). Hildred Laser, Pharm D 04/20/2012 11:20 AM  CrCl is estimated at about 60 ml/min using actual body Weight calculations. Hildred Laser, Pharm D 04/19/2012 3:18 PM   Based on your clinical judgment, please document in the progress notes and discharge summary if a condition below provides greater specificity regarding the patient's GFR and it's impact on her Tikosyn dosing:   - Chronic Kidney Disease Stage I -  GFR > OR = 90  - Chronic Kidney Disease Stage II - GFR 60-89  - Chronic Kidney Disease Stage III - GFR 30-59  - Chronic Kidney Disease Stage IV - GFR 15-29  - Chronic Kidney Disease Stage V - GFR < 15  - ESRD (End Stage Renal Disease)  - Other Condition  - Unable to Clinically Determine   In responding to this query please exercise your independent judgment.    The fact that a query is asked, does not imply that any particular answer is desired or expected.    Reviewed: additional  documentation in the medical record   Thank You,  Erling Conte  RN BSN CCDS Certified Clinical Documentation Specialist: Cell   [phone]  Health Information Management [place]:  1. If needed, update documentation for the patient's encounter via the notes activity.  2. Access this query again and click edit on the [employer].  3. After updating, or not, click F2 to complete all highlighted (required) fields concerning your review. Select "additional documentation in the medical record" OR "no additional documentation provided".  4. Click Sign note button.  5. The deficiency will fall out of your In Basket *Please let us know if you are not able to complete this workflow by phone or e-mail (listed below).

## 2012-04-20 NOTE — Progress Notes (Signed)
Utilization review completed.  

## 2012-04-20 NOTE — Progress Notes (Addendum)
ANTICOAGULATION CONSULT NOTE - Follow Up Consult  Pharmacy Consult for coumadin Indication: atrial fibrillation  No Known Allergies  Patient Measurements: Height: 5\' 3"  (160 cm) Weight: 165 lb 3.2 oz (74.934 kg) IBW/kg (Calculated) : 52.4   Vital Signs: Temp: 98.2 F (36.8 C) (02/13 0625) Temp src: Oral (02/13 0625) BP: 134/55 mmHg (02/13 0625) Pulse Rate: 84 (02/13 0625)  Labs:  Recent Labs  04/19/12 0811 04/20/12 0525  LABPROT 27.6*  --   INR 2.7*  --   CREATININE 1.0 1.22*    Estimated Creatinine Clearance: 41.6 ml/min (by C-G formula based on Cr of 1.22).   Medications:  Scheduled:  . vitamin B-12  2,500 mcg Oral Q M,W,F  . dofetilide  250 mcg Oral Q12H  . furosemide  20 mg Oral q morning - 10a  . irbesartan  37.5 mg Oral Daily  . [START ON 04/21/2012] magnesium oxide  400 mg Oral BID  . [COMPLETED] magnesium sulfate LVP 250-500 ml  6 g Intravenous Once  . magnesium sulfate 1 - 4 g bolus IVPB  4 g Intravenous Once  . metFORMIN  1,500 mg Oral Q supper  . metFORMIN  500 mg Oral Q breakfast  . metoprolol succinate  100 mg Oral q morning - 10a  . metoprolol succinate  50 mg Oral q morning - 10a  . pioglitazone  45 mg Oral q morning - 10a  . potassium chloride SA  20 mEq Oral q morning - 10a  . sodium chloride  3 mL Intravenous Q12H  . spironolactone  12.5 mg Oral Daily  . [START ON 04/21/2012] warfarin  2 mg Oral Q M,W,F-1800  . [START ON 04/22/2012] warfarin  4 mg Oral Q T,Th,S,Su-1800  . warfarin  6 mg Oral ONCE-1800  . Warfarin - Pharmacist Dosing Inpatient   Does not apply q1800  . [DISCONTINUED] dofetilide  375 mcg Oral Q12H  . [DISCONTINUED] dofetilide  500 mcg Oral Q12H  . [DISCONTINUED] magnesium sulfate LVP 250-500 ml  4 g Intravenous Once  . [DISCONTINUED] magnesium sulfate 1 - 4 g bolus IVPB  6 g Intravenous Once  . [DISCONTINUED] metFORMIN  500-1,500 mg Oral BID WC  . [DISCONTINUED] Vitamin B-12  1 tablet Sublingual 3 times weekly     Assessment: 72 yo female here for tikosyn initiation on coumadin PTA to continue while admitted. Coumadin was not ordered on 2/12 and dose was missed; (last 3 INRs 1/27, 2/3 and 2/12 have been at goal).   Discussed 6mg  coumadin dose for today only with patient. She preferred to keep with here home regimen for now.   Goal of Therapy:  INR 2-3 Monitor platelets by anticoagulation protocol: Yes   Plan:  -Continue coumadin as taken at home -Daily PT/INR starting in am  Hildred Laser, Pharm D 04/20/2012 11:20 AM

## 2012-04-20 NOTE — Progress Notes (Addendum)
Patient Name: [name]      SUBJECTIVE: no significant chest pain or sob   Past Medical History  Diagnosis Date  . Anemia   . Atrial fibrillation     A.fib/flutter: s/p MAZE 2008, DCCV 2011, on coumadin; previously on flecainide, but dc'd due to worsening EF  . Diverticulitis   . Valvular heart disease     bicuspid AV/severe AS, mitral regurgitation, & aneurysm of the ascending thoracic aorta (s/p AV replacement, MV repair, resection and grafting of the ascending thoracic aortic aneurysm 2008  . CVA (cerebral vascular accident)     2008 - felt due to oscillating calcium on aortic valve  . Carcinoma in situ of vulva   . Fibroid   . Ovarian cyst, left 2008-2009  . Osteoporosis   . Diabetes mellitus   . Hypertension   . GERD (gastroesophageal reflux disease)   . Chronic systolic CHF (congestive heart failure)     EF 35%  . Ischemic cardiomyopathy     EF 35%  . Coronary artery disease     Occluded LM 2/2 previous aortic root surgery; s/p 2v CABG 2010 LIMA to LAD, SVG to OM; Cath 03/11/12 patent SVG to OM, atretic LIMA to LAD, patent RCA, occluded native LM, EF 35%     PHYSICAL EXAM Filed Vitals:   04/19/12 1251 04/19/12 1252 04/19/12 2124 04/20/12 0625  BP: 137/73  116/71 134/55  Pulse: 85 108 79 84  Temp: 98.3 F (36.8 C)  98 F (36.7 C) 98.2 F (36.8 C)  TempSrc:   Oral Oral  Resp: 18  17 18   Height: 5\' 3"  (1.6 m)     Weight: 165 lb 3.2 oz (74.934 kg)     SpO2: 97%  94% 95%    Well developed and nourished in no acute distress HENT normal Neck supple with JVP-flat Clear Regular rate and rhythm, no murmurs or gallops Abd-soft with active BS No Clubbing cyanosis edema Skin-warm and dry A & Oriented  Grossly normal sensory and motor function  TELEMETRY: Reviewed telemetry pt in  nsr :   No intake or output data in the 24 hours ending 04/20/12 0945  LABS: Basic Metabolic Panel:  Recent Labs Lab 04/19/12 0811 04/19/12 2150 04/20/12 0525  NA 138  --   137  K 4.0  --  3.8  CL 104  --  101  CO2 26  --  26  GLUCOSE 126*  --  94  BUN 16  --  16  CREATININE 1.0  --  1.22*  CALCIUM 9.5  --  9.3  MG 1.1* 3.3* 2.0   Cardiac Enzymes: No results found for this basename: CKTOTAL, CKMB, CKMBINDEX, TROPONINI,  in the last 72 hours CBC: No results found for this basename: WBC, NEUTROABS, HGB, HCT, MCV, PLT,  in the last 168 hours PROTIME:  Recent Labs  04/19/12 0811  LABPROT 27.6*  INR 2.7*   ECG: Sinus Rhythm  @85             Intervals  17/11/440 QTc >500  Axis -13     ASSESSMENT AND PLAN:    Active Problems:   Atrial fibrillation   Ischemic cardiomyopathy   Valvular heart disease-bicuspid aortic valve s/p Mechanical replacement//MV repair Hypomagnesiumemia  QTc lenghterned >  500;  GFR borderline decrease tikosyn>> am not sure she will even tolerate the 250 given the magnitude of the delta overnight, although the electrolyte issue maybe contributing somewhat   Add aldactone for ischemic  cardiomyopathy Replete magnesium Consider reassessment of LVEF in 8-12 weeks following addtion of aldactone   Signed, Virl Axe MD  04/20/2012  Renal function stage 3 GFR <=60 CKD

## 2012-04-20 NOTE — Progress Notes (Signed)
Pt K 3.8. 20mg  PO given per usual morning dose. [place], [place] notified. Per her, that should get her above 4.0. Also spoke with Janett Billow about patient increase in QTc. Awaiting new orders. Dorna Bloom, RN

## 2012-04-20 NOTE — Progress Notes (Signed)
Called by RN because QTc is borderline for Tikosyn.  ECGs reviewed and QTc is 1.15 x baseline, right at 500 ms. ECG after 500 mcg dose was > 500 ms. Dose was decreased and QTc improved just below criteria listed above.  Pt without significant ectopy and has rec'd some K+ supplement. Will give a little more K+ tonight. Continue current Tikosyn dose and f/u in am. Labs are ordered.  Thayer Headings, Brooke Bonito., MD, [facility] 04/21/2012, 6:50 AM Office - [phone] Pager [phone]

## 2012-04-20 NOTE — Progress Notes (Signed)
Spoke with Rosaria Ferries, PA to confirm QTC after 2nd dose of tikosyn. EKG faxed to [phone]. Will continue to monitor patient closely. 3rd dose of tikosyn scheduled for 2230. Dorna Bloom, RN

## 2012-04-21 DIAGNOSIS — I2589 Other forms of chronic ischemic heart disease: Secondary | ICD-10-CM | POA: Diagnosis not present

## 2012-04-21 DIAGNOSIS — I4891 Unspecified atrial fibrillation: Secondary | ICD-10-CM | POA: Diagnosis not present

## 2012-04-21 LAB — BASIC METABOLIC PANEL
Calcium: 9.2 mg/dL (ref 8.4–10.5)
Creatinine, Ser: 0.9 mg/dL (ref 0.50–1.10)
GFR calc non Af Amer: 63 mL/min — ABNORMAL LOW (ref >90)
Sodium: 136 mEq/L (ref 135–145)

## 2012-04-21 LAB — PROTIME-INR
INR: 1.98 — ABNORMAL HIGH (ref 0.00–1.49)
Prothrombin Time: 21.7 seconds — ABNORMAL HIGH (ref 11.6–15.2)

## 2012-04-21 LAB — GLUCOSE, CAPILLARY: Glucose-Capillary: 88 mg/dL (ref 70–99)

## 2012-04-21 LAB — MAGNESIUM: Magnesium: 2.1 mg/dL (ref 1.5–2.5)

## 2012-04-21 MED ORDER — DOFETILIDE 250 MCG PO CAPS
375.0000 ug | ORAL_CAPSULE | Freq: Two times a day (BID) | ORAL | Status: DC
  Administered 2012-04-21 – 2012-04-22 (×3): 375 ug via ORAL
  Filled 2012-04-21 (×4): qty 1

## 2012-04-21 MED ORDER — WARFARIN SODIUM 4 MG PO TABS
4.0000 mg | ORAL_TABLET | Freq: Once | ORAL | Status: AC
  Administered 2012-04-21: 4 mg via ORAL
  Filled 2012-04-21: qty 1

## 2012-04-21 NOTE — Progress Notes (Signed)
ANTICOAGULATION CONSULT NOTE - Follow Up Consult  Pharmacy Consult for coumadin Indication: atrial fibrillation  No Known Allergies  Patient Measurements: Height: 5\' 3"  (160 cm) Weight: 165 lb 8 oz (75.07 kg) IBW/kg (Calculated) : 52.4   Vital Signs: Temp: 97.9 F (36.6 C) (02/14 0600) BP: 133/75 mmHg (02/14 1007) Pulse Rate: 78 (02/14 1007)  Labs:  Recent Labs  04/19/12 0811 04/20/12 0525 04/21/12 0457  LABPROT 27.6*  --  21.7*  INR 2.7*  --  1.98*  CREATININE 1.0 1.22* 0.90    Estimated Creatinine Clearance: 56.5 ml/min (by C-G formula based on Cr of 0.9).   Medications:  Scheduled:  . vitamin B-12  2,500 mcg Oral Q M,W,F  . dofetilide  250 mcg Oral Q12H  . furosemide  20 mg Oral q morning - 10a  . irbesartan  37.5 mg Oral Daily  . magnesium oxide  400 mg Oral BID  . [COMPLETED] magnesium sulfate 1 - 4 g bolus IVPB  4 g Intravenous Once  . metFORMIN  1,500 mg Oral Q supper  . metFORMIN  500 mg Oral Q breakfast  . metoprolol succinate  100 mg Oral q morning - 10a  . metoprolol succinate  50 mg Oral q morning - 10a  . pioglitazone  45 mg Oral q morning - 10a  . potassium chloride SA  20 mEq Oral q morning - 10a  . [COMPLETED] potassium chloride  40 mEq Oral Once  . sodium chloride  3 mL Intravenous Q12H  . spironolactone  12.5 mg Oral Daily  . warfarin  2 mg Oral Q M,W,F-1800  . [START ON 04/22/2012] warfarin  4 mg Oral Q T,Th,S,Su-1800  . [COMPLETED] warfarin  4 mg Oral ONCE-1800  . Warfarin - Pharmacist Dosing Inpatient   Does not apply q1800  . [DISCONTINUED] warfarin  6 mg Oral ONCE-1800    Assessment: 72 yo female here for tikosyn initiation on coumadin PTA to continue while admitted. Coumadin was not ordered on 2/12 and dose was missed; (last 3 INRs 1/27, 2/3 and 2/12 have been at goal); INR just below goal.   QTc 500, K 5.3, on Dofetilide 250 mcg daily.  Dose today being held until seen by Dr. Rayann Heman. Have asked RN to initiate tikosyn video education  if dose given today.  Goal of Therapy:  INR 2-3 Monitor platelets by anticoagulation protocol: Yes   Plan:  -Give Warfarin 4 mg PO today vs normal 2 mg dose, then continue coumadin as taken at home, spoke with patient at length as she is quite sensitive to warfarin dose adjustments and has been high since recent change to Warfarin 4 mg daily except 2 mg MWF. -Daily PT/INR starting in am   Thank you for allowing pharmacy to be a part of this patients care team.  Rowe Robert Pharm.D., BCPS Clinical Pharmacist 04/21/2012 10:15 AM Pager: [phone] Phone: [phone]

## 2012-04-21 NOTE — Progress Notes (Signed)
SUBJECTIVE: The patient is doing well today.  At this time, she denies chest pain, shortness of breath, or any new concerns.  . vitamin B-12  2,500 mcg Oral Q M,W,F  . dofetilide  250 mcg Oral Q12H  . furosemide  20 mg Oral q morning - 10a  . irbesartan  37.5 mg Oral Daily  . magnesium oxide  400 mg Oral BID  . metFORMIN  1,500 mg Oral Q supper  . metFORMIN  500 mg Oral Q breakfast  . metoprolol succinate  100 mg Oral q morning - 10a  . metoprolol succinate  50 mg Oral q morning - 10a  . pioglitazone  45 mg Oral q morning - 10a  . potassium chloride SA  20 mEq Oral q morning - 10a  . sodium chloride  3 mL Intravenous Q12H  . spironolactone  12.5 mg Oral Daily  . warfarin  2 mg Oral Q M,W,F-1800  . [START ON 04/22/2012] warfarin  4 mg Oral Q T,Th,S,Su-1800  . warfarin  4 mg Oral ONCE-1800  . Warfarin - Pharmacist Dosing Inpatient   Does not apply q1800      OBJECTIVE: Physical Exam: Filed Vitals:   04/20/12 1446 04/20/12 2100 04/21/12 0600 04/21/12 1007  BP: 120/67 121/86 111/70 133/75  Pulse: 86 74 78 78  Temp: 98 F (36.7 C) 98.3 F (36.8 C) 97.9 F (36.6 C)   TempSrc:      Resp: 18 14 16    Height:      Weight:   165 lb 8 oz (75.07 kg)   SpO2: 96% 96% 94%     Intake/Output Summary (Last 24 hours) at 04/21/12 1056 Last data filed at 04/20/12 2247  Gross per 24 hour  Intake      3 ml  Output      0 ml  Net      3 ml    Telemetry reveals sinus rhythm with PAC's  GEN- The patient is well appearing, alert and oriented x 3 today.   Head- normocephalic, atraumatic Eyes-  Sclera clear, conjunctiva pink Ears- hearing intact Oropharynx- clear Neck- supple, no JVP Lymph- no cervical lymphadenopathy Lungs- Clear to ausculation bilaterally, normal work of breathing Heart- Regular rate and rhythm, no murmurs, rubs or gallops, PMI not laterally displaced GI- soft, NT, ND, + BS Extremities- no clubbing, cyanosis, or edema Skin- no rash or lesion Psych- euthymic mood,  full affect Neuro- strength and sensation are intact  LABS: Basic Metabolic Panel:  Recent Labs  04/20/12 0525 04/21/12 0457  NA 137 136  K 3.8 5.3*  CL 101 104  CO2 26 27  GLUCOSE 94 117*  BUN 16 13  CREATININE 1.22* 0.90  CALCIUM 9.3 9.2  MG 2.0 2.1   EKG: sinus rhythm with PAC's, QTc 473msec  ASSESSMENT AND PLAN:  Active Problems:   Atrial fibrillation   Ischemic cardiomyopathy   Valvular heart disease-bicuspid aortic valve s/p Mechanical replacement//MV repair   Hypomagnesemia  1. afib Stable Qt (upon my review actual QT is 444msec) Will increase tikosyn to 351mcg BID today.  Hopefully will discharge in am after am dose if qt remains stable  Continue coumadin  2. Chronic systolic dysfunction Given elevated K, I think we need to follow her closely on aldactone. Will stop K for now and follow  3. Hypokalemia Stop K and follow as above  4. Cardiomyopathy Will defer further discussions about ICD until office follow-up  DC to home in am followup with me  in 4 weeks   Thompson Grayer, MD 04/21/2012 10:56 AM

## 2012-04-21 NOTE — Care Management Note (Unsigned)
    Page 1 of 1   04/21/2012     4:57:33 PM   CARE MANAGEMENT NOTE 04/21/2012  Patient:  [name], [name]   Account Number:  [id number]  Date Initiated:  04/21/2012  Documentation initiated by:  GRAVES-BIGELOW,Kashus Karlen  Subjective/Objective Assessment:   Pt admitted for tikosyn load.     Action/Plan:   CM did call [facility] on [address]. The medication is at the store and co pay cost will be 70.00 for 125 mg dose and 7 for 250 mg dose. Pt is aware.   Anticipated DC Date:  04/22/2012   Anticipated DC Plan:  [place]  CM consult  Medication Assistance      Choice offered to / List presented to:             Status of service:  In process, will continue to follow Medicare Important Message given?   (If response is "NO", the following Medicare IM given date fields will be blank) Date Medicare IM given:   Date Additional Medicare IM given:    Discharge Disposition:    Per UR Regulation:  Reviewed for med. necessity/level of care/duration of stay  If discussed at [place] of Stay Meetings, dates discussed:    Comments:  04-21-12 [address], [place] [phone] Weekend CM will assist with 7 day supply of tikosyn. Please write Rx for 7 day supply no refills and then the original Rx with refills. Thanks.

## 2012-04-22 DIAGNOSIS — I4891 Unspecified atrial fibrillation: Secondary | ICD-10-CM | POA: Diagnosis not present

## 2012-04-22 LAB — BASIC METABOLIC PANEL
Chloride: 102 mEq/L (ref 96–112)
GFR calc Af Amer: 70 mL/min — ABNORMAL LOW (ref >90)
GFR calc non Af Amer: 60 mL/min — ABNORMAL LOW (ref >90)
Potassium: 4.7 mEq/L (ref 3.5–5.1)
Sodium: 138 mEq/L (ref 135–145)

## 2012-04-22 LAB — GLUCOSE, CAPILLARY

## 2012-04-22 LAB — MAGNESIUM: Magnesium: 1.7 mg/dL (ref 1.5–2.5)

## 2012-04-22 LAB — PROTIME-INR: Prothrombin Time: 21.3 seconds — ABNORMAL HIGH (ref 11.6–15.2)

## 2012-04-22 MED ORDER — MAGNESIUM OXIDE 400 MG PO TABS: 400.0000 mg | ORAL_TABLET | Freq: Two times a day (BID) | ORAL | Status: DC

## 2012-04-22 MED ORDER — DOFETILIDE 125 MCG PO CAPS: 375.0000 ug | ORAL_CAPSULE | Freq: Two times a day (BID) | ORAL | Status: DC

## 2012-04-22 MED ORDER — SPIRONOLACTONE 12.5 MG HALF TABLET: 12.5000 mg | ORAL_TABLET | Freq: Every day | ORAL | Status: DC

## 2012-04-22 NOTE — Discharge Summary (Addendum)
Physician Discharge Summary  Patient ID: [name] MRN: [id number] DOB/AGE: 10-May-1940 72 y.o.  Admit date: 04/19/2012 Discharge date: 04/22/2012  Primary Cardiologist: [name], MD Primary Electrophysiologist: Thompson Grayer, MD  Primary Discharge Diagnosis: 1 Atrial fibrillation  - Elective admission for Tikosyn load  2 Hypokalemia/hypomagnesemia  Past Medical History  Diagnosis Date  . Anemia   . Atrial fibrillation     A.fib/flutter: s/p MAZE 2008, DCCV 2011, on coumadin; previously on flecainide, but dc'd due to worsening EF  . Diverticulitis   . Valvular heart disease     bicuspid AV/severe AS, mitral regurgitation, & aneurysm of the ascending thoracic aorta (s/p AV replacement, MV repair, resection and grafting of the ascending thoracic aortic aneurysm 2008  . CVA (cerebral vascular accident)     2008 - felt due to oscillating calcium on aortic valve  . Carcinoma in situ of vulva   . Fibroid   . Ovarian cyst, left 2008-2009  . Osteoporosis   . Diabetes mellitus   . Hypertension   . GERD (gastroesophageal reflux disease)   . Chronic systolic CHF (congestive heart failure)     EF 35%  . Ischemic cardiomyopathy     EF 35%  . Coronary artery disease     Occluded LM 2/2 previous aortic root surgery; s/p 2v CABG 2010 LIMA to LAD, SVG to OM; Cath 03/11/12 patent SVG to OM, atretic LIMA to LAD, patent RCA, occluded native LM, EF 35%   . Shortness of breath   . Arthritis     OSTEO  . Fracture of lumbar spine    Reason for Admission: 72 y.o. female w/ PMHx significant for bicuspid AV/severe AS, mitral regurgitation, & aneurysm of the ascending thoracic aorta (s/p AV replacement, MV repair, resection and grafting of the ascending thoracic aortic aneurysm 2008), CAD 2/2 occluded LM at site of reimplantation (s/p 2v CABG [id number]), Chronic Systolic CHF/ICM (EF [id number]), PAFib/flutter (s/p MAZE 2008, [place] 2011, on coumadin), HTN, DMII, Fe Def Anemia, and CVA 2008 who presented to [facility] 04/19/2012 for Tikosyn loading and ICD implantation.  Hospital Course: Prior to initiation of Tikosyn, patient required IV magnesium repletion, following admission level of 1.1. This stabilized to a level of 1.7, by time of discharge.  Patient was initially started on Tikosyn 500 mcg twice a day. However, subsequent ECG yielded QTc > 500 ms. Dose was decreased to 250 mcg and QTc improved, just below criteria threshold. Dr. Rayann Heman then increased dose to 375 mcg, based on his calculation of actual QT at 447 ms. He also stopped K supplementation, given the addition of Aldactone. He also indicated that he would defer further discussions about ICD implantation, until post hospital followup with him in the clinic.  Dr. Caryl Comes added Aldactone for management of ischemic cardiomyopathy, with recommendation for reassessment of LVF in 8-12 weeks.  EKG on a.m. of discharge: NSR with QTC 0.465 ms. INR 1.93, K 4.7, Mg 1.7 at time of discharge.  Discharge Vitals: Blood pressure 128/74, pulse 82, temperature 98.9 F (37.2 C), temperature source Oral, resp. rate 18, height 5\' 3"  (1.6 m), weight 165 lb 8 oz (75.07 kg), SpO2 95.00%. Labs: Lab Results  Component Value Date   WBC 9.3 03/20/2012   HGB 9.7* 03/20/2012   HCT 31.8* 03/20/2012   MCV 83.5 03/20/2012   PLT 423.0* 03/20/2012    Recent Labs Lab 04/22/12 0600  NA 138  K 4.7  CL 102  CO2 24  BUN 14  CREATININE 0.94  CALCIUM 9.7  GLUCOSE 102*   Lab Results  Component Value Date   CHOL 200 04/08/2011   HDL 62.70 04/08/2011   LDLCALC 115* 04/08/2011   TRIG 110.0 04/08/2011   Lab Results  Component Value Date   TSH 1.21 08/11/2011   DISPOSITION: Stable condition  FOLLOW UP PLANS AND APPOINTMENTS: Discharge Orders   Future Appointments Provider Department Dept Phone   05/03/2012 9:15 AM [id number] [id number]   06/28/2012 10:30 AM Lelon Perla, MD [place] Office  [place]) [phone]   Future Orders Complete By Expires     Diet - low sodium heart healthy  As directed     Increase activity slowly  As directed           Follow-up Information   Follow up with Thompson Grayer, MD In 4 weeks. (office to call and arrange)    Contact information:   [address], [address] [place] [address] [phone]     DISCHARGE MEDICATIONS:   Medication List    STOP taking these medications       potassium chloride SA 20 MEQ tablet  Commonly known as:  K-DUR,KLOR-CON      TAKE these medications       dofetilide 125 MCG capsule  Commonly known as:  TIKOSYN  Take 3 capsules (375 mcg total) by mouth every 12 (twelve) hours.     furosemide 20 MG tablet  Commonly known as:  LASIX  Take 20 mg by mouth every morning.     magnesium oxide 400 MG tablet  Commonly known as:  MAG-OX 400  Take 1 tablet (400 mg total) by mouth 2 (two) times daily.     metFORMIN 500 MG 24 hr tablet  Commonly known as:  GLUCOPHAGE-XR  Take 500-1,500 mg by mouth 2 (two) times daily with a meal. Takes 1 tablet in am and 3 tablets in pm     metoprolol succinate 100 MG 24 hr tablet  Commonly known as:  TOPROL-XL  Take 100 mg by mouth every morning.     metoprolol succinate 50 MG 24 hr tablet  Commonly known as:  TOPROL-XL  Take 50 mg by mouth every morning. Take with or immediately following a meal.     pioglitazone 45 MG tablet  Commonly known as:  ACTOS  Take 45 mg by mouth every morning.     PRILOSEC OTC 20 MG tablet  Generic drug:  omeprazole  Take 20 mg by mouth daily after breakfast.     spironolactone 12.5 mg Tabs  Commonly known as:  ALDACTONE  Take 0.5 tablets (12.5 mg total) by mouth daily.     telmisartan 20 MG tablet  Commonly known as:  MICARDIS  Take 10 mg by mouth every morning.     Vitamin B-12 2500 MCG Subl  Place 1 tablet under the tongue 3 (three) times a week. Dissolves 1 tablet under the tongue on Monday, Wednesday, and Friday      Vitamin D3 50000 UNITS Caps  Take 1 tablet by mouth once a week.     warfarin 2 MG tablet  Commonly known as:  COUMADIN  Take 2 mg by mouth 3 (three) times a week. Monday, Wednesday and Friday     warfarin 4 MG tablet  Commonly known as:  COUMADIN  Take 4 mg by mouth 4 (four) times a week. Tuesday, Thursday, Saturday and Sunday      BRING  ALL MEDICATIONS WITH YOU TO FOLLOW UP APPOINTMENTS  Time spent with patient to include physician time: Greater than 30 minutes, including physician time.  SERPE, EUGENE 04/22/2012, 10:20 AM  Cardiology Attending Patient interviewed and examined. Discussed with Aurora Mask, PA-C.  Above note annotated and modified based upon my findings.  10 minutes spent with patient discussing her hospital course and answering her questions. EKG reviewed and QTc interval less than 465 ms verified. Patient will be discharged on current dose of dofetilide with magnesium supplementation and close followup of metabolic parameters.  Bmet in one and 3 weeks.  Jacqulyn Ducking, MD 04/24/2012, 9:13 AM

## 2012-04-23 NOTE — Assessment & Plan Note (Signed)
The patient has symptomatic paroxysmal atrial fibrillation.  Given recent decline in EF, she is not an ideal candidate for flecainide. Therapeutic strategies for afib including medicine and ablation were discussed in detail with the patient today. Risk, benefits, and alternatives to EP study and radiofrequency ablation for afib were also discussed in detail today.  At this time, I would favor trying a different antiarrhythmic drug. We discussed tikosyn and amiodarone as options.  At this time, she would like to be admitted for tikosyn loading. She is appropriately anticoagulated with coumadin.

## 2012-04-23 NOTE — Assessment & Plan Note (Signed)
EF 33% by myoview.  She has been treated with an optimal medical regimen. Today we discussed ICD implantation as an option.  At this time, she is not sure that she would want to have an ICD implanted.  She will continue to contemplate this going forward.

## 2012-04-24 ENCOUNTER — Other Ambulatory Visit: Payer: Self-pay | Admitting: Pharmacist

## 2012-04-24 ENCOUNTER — Telehealth: Payer: Self-pay | Admitting: Pharmacist

## 2012-04-24 DIAGNOSIS — I4891 Unspecified atrial fibrillation: Secondary | ICD-10-CM

## 2012-04-24 NOTE — Telephone Encounter (Signed)
Pt called with questions regarding D/C instructions from hospital post Tikosyn load.  She had low mag on admission was replaced with IV and d/c with 3 days of po Mag OX.  After discussion with Dr. Rayann Heman - she is to continue Mag Ox until BMET to be checked fri 2/21.  It was also reinforced to stop potassium supplementation until post hospital BMET results since she was started of spironolactone during this hospitalization.

## 2012-04-26 ENCOUNTER — Other Ambulatory Visit: Payer: Self-pay | Admitting: Cardiology

## 2012-04-27 ENCOUNTER — Ambulatory Visit: Payer: Medicare Other | Admitting: Cardiology

## 2012-04-28 ENCOUNTER — Institutional Professional Consult (permissible substitution): Payer: Medicare Other | Admitting: Internal Medicine

## 2012-04-28 ENCOUNTER — Ambulatory Visit (INDEPENDENT_AMBULATORY_CARE_PROVIDER_SITE_OTHER): Payer: Medicare Other | Admitting: *Deleted

## 2012-04-28 ENCOUNTER — Other Ambulatory Visit (INDEPENDENT_AMBULATORY_CARE_PROVIDER_SITE_OTHER): Payer: Medicare Other

## 2012-04-28 DIAGNOSIS — I359 Nonrheumatic aortic valve disorder, unspecified: Secondary | ICD-10-CM

## 2012-04-28 DIAGNOSIS — I059 Rheumatic mitral valve disease, unspecified: Secondary | ICD-10-CM

## 2012-04-28 DIAGNOSIS — I4891 Unspecified atrial fibrillation: Secondary | ICD-10-CM | POA: Diagnosis not present

## 2012-04-28 DIAGNOSIS — Z7901 Long term (current) use of anticoagulants: Secondary | ICD-10-CM | POA: Diagnosis not present

## 2012-04-28 DIAGNOSIS — R0989 Other specified symptoms and signs involving the circulatory and respiratory systems: Secondary | ICD-10-CM

## 2012-04-28 LAB — BASIC METABOLIC PANEL
GFR: 57.44 mL/min — ABNORMAL LOW (ref >60.00)
Potassium: 4.1 mEq/L (ref 3.5–5.1)
Sodium: 136 mEq/L (ref 135–145)

## 2012-04-28 LAB — POCT INR: INR: 3.2

## 2012-04-28 MED ORDER — MAGNESIUM OXIDE 400 MG PO TABS: 800.0000 mg | ORAL_TABLET | Freq: Two times a day (BID) | ORAL | Status: DC

## 2012-04-28 NOTE — Progress Notes (Signed)
Post Tikosyn initiation EKG had QTc 451 which is within range of < 500 without defibrillator placement, with SR today in office.  INR within range 2-3.  Bmet and Magnesium level drawn and in process.  Pt will be called this afternoon with results and directions.  Defibrillator implantation conversation will occur at next EP appt per hospital note. Pt has HF with EF 33% and tx with furosemide/KCL pta.  Potassium was stopped during admission when spironolactone was started.   Post hospitalization BMET Cr 1 K 4.1  Mag 1.4 falling since hospital d/c 1.7 despite tx with MagOx 400mg  BID.  She has been instructed to increase MagOx 800 bid and return for repeat labs next week.   She now does complain about chronic diarrhea d /t metformin use and this may need to be addressed in the future.

## 2012-05-03 ENCOUNTER — Other Ambulatory Visit: Payer: Medicare Other

## 2012-05-03 DIAGNOSIS — M810 Age-related osteoporosis without current pathological fracture: Secondary | ICD-10-CM | POA: Diagnosis not present

## 2012-05-04 ENCOUNTER — Other Ambulatory Visit (INDEPENDENT_AMBULATORY_CARE_PROVIDER_SITE_OTHER): Payer: Medicare Other

## 2012-05-04 ENCOUNTER — Encounter: Payer: Self-pay | Admitting: *Deleted

## 2012-05-04 ENCOUNTER — Telehealth: Payer: Self-pay | Admitting: Pharmacist

## 2012-05-04 DIAGNOSIS — I4891 Unspecified atrial fibrillation: Secondary | ICD-10-CM | POA: Diagnosis not present

## 2012-05-04 DIAGNOSIS — I059 Rheumatic mitral valve disease, unspecified: Secondary | ICD-10-CM

## 2012-05-04 DIAGNOSIS — Z7901 Long term (current) use of anticoagulants: Secondary | ICD-10-CM | POA: Diagnosis not present

## 2012-05-04 DIAGNOSIS — I359 Nonrheumatic aortic valve disorder, unspecified: Secondary | ICD-10-CM | POA: Diagnosis not present

## 2012-05-04 LAB — BASIC METABOLIC PANEL WITH GFR
BUN: 17 mg/dL (ref 6–23)
CO2: 27 meq/L (ref 19–32)
Calcium: 9.4 mg/dL (ref 8.4–10.5)
Chloride: 101 meq/L (ref 96–112)
Creatinine, Ser: 1.1 mg/dL (ref 0.4–1.2)
GFR: 50.98 mL/min — ABNORMAL LOW
Glucose, Bld: 114 mg/dL — ABNORMAL HIGH (ref 70–99)
Potassium: 4.6 meq/L (ref 3.5–5.1)
Sodium: 135 meq/L (ref 135–145)

## 2012-05-04 LAB — CBC
Hemoglobin: 9.1 g/dL — ABNORMAL LOW (ref 12.0–15.0)
Platelets: 436 10*3/uL — ABNORMAL HIGH (ref 150.0–400.0)
WBC: 8.5 10*3/uL (ref 4.5–10.5)

## 2012-05-04 LAB — MAGNESIUM: Magnesium: 1.6 mg/dL (ref 1.5–2.5)

## 2012-05-04 NOTE — Telephone Encounter (Signed)
Post hospital BMET within range.  K > 4 on Tikosyn.  Magnesium level < goal of > 1.8 but has improved 1.4> 1.6 on MagOx 800mg  BID.  Diarrhea has not worsened.  Discussed with Jerene Pitch PA and have instructed pt to increase MagOx 800mg  TID.  We will check Magnesium level in 2 weeks when next INR due.  Will disucuss with Dr. Rayann Heman and make any needed changes next week.   She also has slight hematuria from known large kidney stones.  She has an appt with urologist next week.  Planning lithothripsy but unsure when.  May need to hold Coumadin.  She will discuss that with urologist and Dr. Rayann Heman in near furture.

## 2012-05-09 DIAGNOSIS — N200 Calculus of kidney: Secondary | ICD-10-CM | POA: Diagnosis not present

## 2012-05-09 DIAGNOSIS — R310 Gross hematuria: Secondary | ICD-10-CM | POA: Diagnosis not present

## 2012-05-18 ENCOUNTER — Encounter (HOSPITAL_COMMUNITY): Payer: Self-pay | Admitting: Physician Assistant

## 2012-05-18 ENCOUNTER — Inpatient Hospital Stay (HOSPITAL_COMMUNITY): Payer: Medicare Other

## 2012-05-18 ENCOUNTER — Emergency Department (HOSPITAL_COMMUNITY): Payer: Medicare Other

## 2012-05-18 ENCOUNTER — Inpatient Hospital Stay (HOSPITAL_COMMUNITY)
Admission: EM | Admit: 2012-05-18 | Discharge: 2012-06-09 | DRG: 226 | Disposition: A | Discharge status: Rehabilitation Facility | Payer: Medicare Other | Attending: Cardiology | Admitting: Cardiology

## 2012-05-18 DIAGNOSIS — G9340 Encephalopathy, unspecified: Secondary | ICD-10-CM

## 2012-05-18 DIAGNOSIS — K219 Gastro-esophageal reflux disease without esophagitis: Secondary | ICD-10-CM | POA: Diagnosis present

## 2012-05-18 DIAGNOSIS — Z800 Family history of malignant neoplasm of digestive organs: Secondary | ICD-10-CM

## 2012-05-18 DIAGNOSIS — M7989 Other specified soft tissue disorders: Secondary | ICD-10-CM | POA: Diagnosis not present

## 2012-05-18 DIAGNOSIS — N390 Urinary tract infection, site not specified: Secondary | ICD-10-CM | POA: Diagnosis present

## 2012-05-18 DIAGNOSIS — R918 Other nonspecific abnormal finding of lung field: Secondary | ICD-10-CM | POA: Diagnosis present

## 2012-05-18 DIAGNOSIS — Z794 Long term (current) use of insulin: Secondary | ICD-10-CM

## 2012-05-18 DIAGNOSIS — Z8249 Family history of ischemic heart disease and other diseases of the circulatory system: Secondary | ICD-10-CM

## 2012-05-18 DIAGNOSIS — J9819 Other pulmonary collapse: Secondary | ICD-10-CM | POA: Diagnosis not present

## 2012-05-18 DIAGNOSIS — I82619 Acute embolism and thrombosis of superficial veins of unspecified upper extremity: Secondary | ICD-10-CM | POA: Diagnosis not present

## 2012-05-18 DIAGNOSIS — I2589 Other forms of chronic ischemic heart disease: Secondary | ICD-10-CM | POA: Diagnosis not present

## 2012-05-18 DIAGNOSIS — D649 Anemia, unspecified: Secondary | ICD-10-CM | POA: Diagnosis present

## 2012-05-18 DIAGNOSIS — K7200 Acute and subacute hepatic failure without coma: Secondary | ICD-10-CM | POA: Diagnosis not present

## 2012-05-18 DIAGNOSIS — I472 Ventricular tachycardia, unspecified: Secondary | ICD-10-CM | POA: Diagnosis not present

## 2012-05-18 DIAGNOSIS — E870 Hyperosmolality and hypernatremia: Secondary | ICD-10-CM | POA: Diagnosis not present

## 2012-05-18 DIAGNOSIS — J811 Chronic pulmonary edema: Secondary | ICD-10-CM | POA: Diagnosis not present

## 2012-05-18 DIAGNOSIS — T45515A Adverse effect of anticoagulants, initial encounter: Secondary | ICD-10-CM | POA: Diagnosis present

## 2012-05-18 DIAGNOSIS — G931 Anoxic brain damage, not elsewhere classified: Secondary | ICD-10-CM | POA: Diagnosis present

## 2012-05-18 DIAGNOSIS — E1149 Type 2 diabetes mellitus with other diabetic neurological complication: Secondary | ICD-10-CM | POA: Diagnosis present

## 2012-05-18 DIAGNOSIS — I2582 Chronic total occlusion of coronary artery: Secondary | ICD-10-CM | POA: Diagnosis present

## 2012-05-18 DIAGNOSIS — I509 Heart failure, unspecified: Secondary | ICD-10-CM | POA: Diagnosis present

## 2012-05-18 DIAGNOSIS — I4891 Unspecified atrial fibrillation: Secondary | ICD-10-CM | POA: Diagnosis present

## 2012-05-18 DIAGNOSIS — Z87891 Personal history of nicotine dependence: Secondary | ICD-10-CM

## 2012-05-18 DIAGNOSIS — E872 Acidosis, unspecified: Secondary | ICD-10-CM | POA: Diagnosis present

## 2012-05-18 DIAGNOSIS — M810 Age-related osteoporosis without current pathological fracture: Secondary | ICD-10-CM | POA: Diagnosis present

## 2012-05-18 DIAGNOSIS — E1142 Type 2 diabetes mellitus with diabetic polyneuropathy: Secondary | ICD-10-CM | POA: Diagnosis present

## 2012-05-18 DIAGNOSIS — R1110 Vomiting, unspecified: Secondary | ICD-10-CM | POA: Diagnosis not present

## 2012-05-18 DIAGNOSIS — R570 Cardiogenic shock: Secondary | ICD-10-CM | POA: Diagnosis present

## 2012-05-18 DIAGNOSIS — Z951 Presence of aortocoronary bypass graft: Secondary | ICD-10-CM

## 2012-05-18 DIAGNOSIS — N189 Chronic kidney disease, unspecified: Secondary | ICD-10-CM | POA: Diagnosis present

## 2012-05-18 DIAGNOSIS — K5900 Constipation, unspecified: Secondary | ICD-10-CM | POA: Diagnosis not present

## 2012-05-18 DIAGNOSIS — Z8673 Personal history of transient ischemic attack (TIA), and cerebral infarction without residual deficits: Secondary | ICD-10-CM

## 2012-05-18 DIAGNOSIS — Z4682 Encounter for fitting and adjustment of non-vascular catheter: Secondary | ICD-10-CM | POA: Diagnosis not present

## 2012-05-18 DIAGNOSIS — J9811 Atelectasis: Secondary | ICD-10-CM | POA: Diagnosis not present

## 2012-05-18 DIAGNOSIS — I255 Ischemic cardiomyopathy: Secondary | ICD-10-CM

## 2012-05-18 DIAGNOSIS — I498 Other specified cardiac arrhythmias: Secondary | ICD-10-CM | POA: Diagnosis not present

## 2012-05-18 DIAGNOSIS — Z5189 Encounter for other specified aftercare: Secondary | ICD-10-CM | POA: Diagnosis not present

## 2012-05-18 DIAGNOSIS — J988 Other specified respiratory disorders: Secondary | ICD-10-CM | POA: Diagnosis not present

## 2012-05-18 DIAGNOSIS — I2510 Atherosclerotic heart disease of native coronary artery without angina pectoris: Secondary | ICD-10-CM | POA: Diagnosis present

## 2012-05-18 DIAGNOSIS — I38 Endocarditis, valve unspecified: Secondary | ICD-10-CM | POA: Diagnosis present

## 2012-05-18 DIAGNOSIS — J9600 Acute respiratory failure, unspecified whether with hypoxia or hypercapnia: Secondary | ICD-10-CM | POA: Diagnosis present

## 2012-05-18 DIAGNOSIS — I428 Other cardiomyopathies: Secondary | ICD-10-CM | POA: Diagnosis not present

## 2012-05-18 DIAGNOSIS — I4901 Ventricular fibrillation: Principal | ICD-10-CM | POA: Diagnosis not present

## 2012-05-18 DIAGNOSIS — Z780 Asymptomatic menopausal state: Secondary | ICD-10-CM

## 2012-05-18 DIAGNOSIS — T45511A Poisoning by anticoagulants, accidental (unintentional), initial encounter: Secondary | ICD-10-CM

## 2012-05-18 DIAGNOSIS — I469 Cardiac arrest, cause unspecified: Secondary | ICD-10-CM | POA: Diagnosis not present

## 2012-05-18 DIAGNOSIS — I4729 Other ventricular tachycardia: Secondary | ICD-10-CM | POA: Diagnosis not present

## 2012-05-18 DIAGNOSIS — I080 Rheumatic disorders of both mitral and aortic valves: Secondary | ICD-10-CM | POA: Diagnosis present

## 2012-05-18 DIAGNOSIS — I517 Cardiomegaly: Secondary | ICD-10-CM | POA: Diagnosis not present

## 2012-05-18 DIAGNOSIS — Z79899 Other long term (current) drug therapy: Secondary | ICD-10-CM

## 2012-05-18 DIAGNOSIS — J690 Pneumonitis due to inhalation of food and vomit: Secondary | ICD-10-CM | POA: Diagnosis present

## 2012-05-18 DIAGNOSIS — R1310 Dysphagia, unspecified: Secondary | ICD-10-CM | POA: Diagnosis not present

## 2012-05-18 DIAGNOSIS — Z7901 Long term (current) use of anticoagulants: Secondary | ICD-10-CM

## 2012-05-18 DIAGNOSIS — R9431 Abnormal electrocardiogram [ECG] [EKG]: Secondary | ICD-10-CM | POA: Diagnosis not present

## 2012-05-18 DIAGNOSIS — I5023 Acute on chronic systolic (congestive) heart failure: Secondary | ICD-10-CM | POA: Diagnosis not present

## 2012-05-18 DIAGNOSIS — I129 Hypertensive chronic kidney disease with stage 1 through stage 4 chronic kidney disease, or unspecified chronic kidney disease: Secondary | ICD-10-CM | POA: Diagnosis present

## 2012-05-18 DIAGNOSIS — R404 Transient alteration of awareness: Secondary | ICD-10-CM | POA: Diagnosis not present

## 2012-05-18 DIAGNOSIS — D6832 Hemorrhagic disorder due to extrinsic circulating anticoagulants: Secondary | ICD-10-CM

## 2012-05-18 DIAGNOSIS — Z803 Family history of malignant neoplasm of breast: Secondary | ICD-10-CM

## 2012-05-18 DIAGNOSIS — J90 Pleural effusion, not elsewhere classified: Secondary | ICD-10-CM | POA: Diagnosis not present

## 2012-05-18 DIAGNOSIS — R5381 Other malaise: Secondary | ICD-10-CM | POA: Diagnosis not present

## 2012-05-18 DIAGNOSIS — R0989 Other specified symptoms and signs involving the circulatory and respiratory systems: Secondary | ICD-10-CM | POA: Diagnosis not present

## 2012-05-18 DIAGNOSIS — R791 Abnormal coagulation profile: Secondary | ICD-10-CM | POA: Diagnosis present

## 2012-05-18 DIAGNOSIS — I5022 Chronic systolic (congestive) heart failure: Secondary | ICD-10-CM | POA: Diagnosis not present

## 2012-05-18 DIAGNOSIS — T458X1A Poisoning by other primarily systemic and hematological agents, accidental (unintentional), initial encounter: Secondary | ICD-10-CM

## 2012-05-18 DIAGNOSIS — E1165 Type 2 diabetes mellitus with hyperglycemia: Secondary | ICD-10-CM | POA: Diagnosis present

## 2012-05-18 DIAGNOSIS — Z954 Presence of other heart-valve replacement: Secondary | ICD-10-CM

## 2012-05-18 HISTORY — DX: Nonrheumatic mitral (valve) insufficiency: I34.0

## 2012-05-18 HISTORY — DX: Thoracic aortic aneurysm, without rupture, unspecified: I71.20

## 2012-05-18 HISTORY — DX: Acute embolism and thrombosis of superficial veins of unspecified upper extremity: I82.619

## 2012-05-18 HISTORY — DX: Ventricular fibrillation: I49.01

## 2012-05-18 HISTORY — DX: Nonrheumatic aortic (valve) stenosis: I35.0

## 2012-05-18 HISTORY — DX: Thoracic aortic aneurysm, without rupture: I71.2

## 2012-05-18 LAB — POCT I-STAT 3, ART BLOOD GAS (G3+)
pCO2 arterial: 38.4 mmHg (ref 35.0–45.0)
pH, Arterial: 7.292 — ABNORMAL LOW (ref 7.350–7.450)

## 2012-05-18 LAB — PROTIME-INR
INR: 5.05 (ref 0.00–1.49)
INR: 3.41 — ABNORMAL HIGH (ref 0.00–1.49)
INR: 4.7 — ABNORMAL HIGH (ref 0.00–1.49)
Prothrombin Time: 43.5 seconds — ABNORMAL HIGH (ref 11.6–15.2)
Prothrombin Time: 41.2 seconds — ABNORMAL HIGH (ref 11.6–15.2)

## 2012-05-18 LAB — APTT
aPTT: 41 seconds — ABNORMAL HIGH (ref 24–37)
aPTT: 54 seconds — ABNORMAL HIGH (ref 24–37)

## 2012-05-18 LAB — POCT I-STAT, CHEM 8
BUN: 23 mg/dL (ref 6–23)
BUN: 23 mg/dL (ref 6–23)
BUN: 22 mg/dL (ref 6–23)
Calcium, Ion: 1.27 mmol/L (ref 1.13–1.30)
Calcium, Ion: 1.25 mmol/L (ref 1.13–1.30)
Calcium, Ion: 1.27 mmol/L (ref 1.13–1.30)
Chloride: 110 mEq/L (ref 96–112)
Chloride: 112 mEq/L (ref 96–112)
Creatinine, Ser: 0.8 mg/dL (ref 0.50–1.10)
Glucose, Bld: 408 mg/dL — ABNORMAL HIGH (ref 70–99)
HCT: 29 % — ABNORMAL LOW (ref 36.0–46.0)
HCT: 28 % — ABNORMAL LOW (ref 36.0–46.0)
HCT: 28 % — ABNORMAL LOW (ref 36.0–46.0)
Hemoglobin: 9.9 g/dL — ABNORMAL LOW (ref 12.0–15.0)
Potassium: 3.6 mEq/L (ref 3.5–5.1)
Potassium: 2.9 mEq/L — ABNORMAL LOW (ref 3.5–5.1)
Sodium: 140 mEq/L (ref 135–145)
Sodium: 141 mEq/L (ref 135–145)
TCO2: 18 mmol/L (ref 0–100)

## 2012-05-18 LAB — BASIC METABOLIC PANEL
BUN: 19 mg/dL (ref 6–23)
Chloride: 102 mEq/L (ref 96–112)
Creatinine, Ser: 1.05 mg/dL (ref 0.50–1.10)
GFR calc Af Amer: 60 mL/min — ABNORMAL LOW (ref >90)

## 2012-05-18 LAB — GLUCOSE, CAPILLARY
Glucose-Capillary: 289 mg/dL — ABNORMAL HIGH (ref 70–99)
Glucose-Capillary: 333 mg/dL — ABNORMAL HIGH (ref 70–99)
Glucose-Capillary: 287 mg/dL — ABNORMAL HIGH (ref 70–99)
Glucose-Capillary: 272 mg/dL — ABNORMAL HIGH (ref 70–99)
Glucose-Capillary: 248 mg/dL — ABNORMAL HIGH (ref 70–99)

## 2012-05-18 LAB — PROCALCITONIN: Procalcitonin: <0.1 ng/mL

## 2012-05-18 LAB — COMPREHENSIVE METABOLIC PANEL
AST: 210 U/L — ABNORMAL HIGH (ref 0–37)
Alkaline Phosphatase: 105 U/L (ref 39–117)
BUN: 17 mg/dL (ref 6–23)
CO2: 15 mEq/L — ABNORMAL LOW (ref 19–32)
Chloride: 105 mEq/L (ref 96–112)
Creatinine, Ser: 0.84 mg/dL (ref 0.50–1.10)
GFR calc non Af Amer: 68 mL/min — ABNORMAL LOW (ref >90)
Potassium: 3.6 mEq/L (ref 3.5–5.1)
Total Bilirubin: 0.2 mg/dL — ABNORMAL LOW (ref 0.3–1.2)

## 2012-05-18 LAB — CBC WITH DIFFERENTIAL/PLATELET
Eosinophils Relative: 2 % (ref 0–5)
HCT: 30.1 % — ABNORMAL LOW (ref 36.0–46.0)
Hemoglobin: 9 g/dL — ABNORMAL LOW (ref 12.0–15.0)
Lymphs Abs: 2.9 10*3/uL (ref 0.7–4.0)
MCV: 88.5 fL (ref 78.0–100.0)
Monocytes Relative: 3 % (ref 3–12)
Neutro Abs: 6.4 10*3/uL (ref 1.7–7.7)
RBC: 3.4 MIL/uL — ABNORMAL LOW (ref 3.87–5.11)
WBC: 9.9 10*3/uL (ref 4.0–10.5)

## 2012-05-18 LAB — CG4 I-STAT (LACTIC ACID): Lactic Acid, Venous: 7.87 mmol/L — ABNORMAL HIGH (ref 0.5–2.2)

## 2012-05-18 MED ORDER — EPINEPHRINE HCL 1 MG/ML IJ SOLN
0.5000 ug/min | INTRAVENOUS | Status: DC
  Administered 2012-05-18: 0.5 ug/min via INTRAVENOUS
  Filled 2012-05-18: qty 1

## 2012-05-18 MED ORDER — DEXTROSE 5 % IV SOLN
150.0000 mg | INTRAVENOUS | Status: AC | PRN
  Administered 2012-05-18 (×2): 150 mg via INTRAVENOUS

## 2012-05-18 MED ORDER — AMIODARONE HCL IN DEXTROSE 360-4.14 MG/200ML-% IV SOLN
60.0000 mg/h | INTRAVENOUS | Status: AC
  Administered 2012-05-19: 60 mg/h via INTRAVENOUS
  Filled 2012-05-18: qty 200

## 2012-05-18 MED ORDER — SODIUM CHLORIDE 0.9 % IV SOLN
1.0000 mg/h | INTRAVENOUS | Status: DC
  Administered 2012-05-18: 2 mg/h via INTRAVENOUS
  Filled 2012-05-18: qty 10

## 2012-05-18 MED ORDER — SODIUM CHLORIDE 0.9 % IV SOLN
25.0000 ug/h | INTRAVENOUS | Status: DC
  Administered 2012-05-18: 25 ug/h via INTRAVENOUS

## 2012-05-18 MED ORDER — AMPICILLIN-SULBACTAM SODIUM 1.5 (1-0.5) G IJ SOLR
1.5000 g | Freq: Four times a day (QID) | INTRAMUSCULAR | Status: DC
  Administered 2012-05-18 – 2012-05-25 (×27): 1.5 g via INTRAVENOUS
  Filled 2012-05-18 (×29): qty 1.5

## 2012-05-18 MED ORDER — NOREPINEPHRINE BITARTRATE 1 MG/ML IJ SOLN
2.0000 ug/min | INTRAVENOUS | Status: DC
  Filled 2012-05-18: qty 4

## 2012-05-18 MED ORDER — POTASSIUM CHLORIDE 10 MEQ/50ML IV SOLN
INTRAVENOUS | Status: AC
  Administered 2012-05-18: 10 meq via INTRAVENOUS
  Filled 2012-05-18: qty 50

## 2012-05-18 MED ORDER — SODIUM CHLORIDE 0.9 % IV SOLN: 2000.0000 mL | Freq: Once | INTRAVENOUS | Status: DC

## 2012-05-18 MED ORDER — SODIUM CHLORIDE 0.9 % IV SOLN
1.0000 mg/h | INTRAVENOUS | Status: DC
  Administered 2012-05-19: 3 mg/h via INTRAVENOUS
  Administered 2012-05-21: 1 mg/h via INTRAVENOUS
  Filled 2012-05-18 (×3): qty 10

## 2012-05-18 MED ORDER — AMIODARONE HCL IN DEXTROSE 360-4.14 MG/200ML-% IV SOLN
INTRAVENOUS | Status: AC
  Filled 2012-05-18: qty 200

## 2012-05-18 MED ORDER — MAGNESIUM SULFATE 50 % IJ SOLN: 2.0000 g | Freq: Once | INTRAMUSCULAR | Status: DC

## 2012-05-18 MED ORDER — CISATRACURIUM BOLUS VIA INFUSION
0.1000 mg/kg | Freq: Once | INTRAVENOUS | Status: AC
  Administered 2012-05-18: 7.5 mg via INTRAVENOUS
  Filled 2012-05-18: qty 8

## 2012-05-18 MED ORDER — INSULIN REGULAR HUMAN 100 UNIT/ML IJ SOLN
INTRAMUSCULAR | Status: DC
  Administered 2012-05-18: 2.7 [IU]/h via INTRAVENOUS
  Administered 2012-05-19: 3.9 [IU]/h via INTRAVENOUS
  Filled 2012-05-18: qty 1

## 2012-05-18 MED ORDER — MIDAZOLAM BOLUS VIA INFUSION
2.0000 mg | INTRAVENOUS | Status: DC | PRN
  Filled 2012-05-18: qty 2

## 2012-05-18 MED ORDER — CALCIUM CHLORIDE 10 % IV SOLN
INTRAVENOUS | Status: AC | PRN
  Administered 2012-05-18: 1 g via INTRAVENOUS

## 2012-05-18 MED ORDER — CISATRACURIUM BOLUS VIA INFUSION
0.0500 mg/kg | Freq: Once | INTRAVENOUS | Status: AC | PRN
  Filled 2012-05-18: qty 4

## 2012-05-18 MED ORDER — SODIUM CHLORIDE 0.9 % IV SOLN
2000.0000 mL | Freq: Once | INTRAVENOUS | Status: AC
  Administered 2012-05-18: 2000 mL via INTRAVENOUS

## 2012-05-18 MED ORDER — MAGNESIUM SULFATE 40 MG/ML IJ SOLN
2.0000 g | Freq: Once | INTRAMUSCULAR | Status: AC
  Administered 2012-05-18: 2 g via INTRAVENOUS
  Filled 2012-05-18: qty 50

## 2012-05-18 MED ORDER — MIDAZOLAM HCL 5 MG/ML IJ SOLN: 2.0000 mg | Freq: Once | INTRAMUSCULAR | Status: DC

## 2012-05-18 MED ORDER — FENTANYL CITRATE 0.05 MG/ML IJ SOLN: 100.0000 ug | Freq: Once | INTRAMUSCULAR | Status: DC

## 2012-05-18 MED ORDER — SODIUM CHLORIDE 0.9 % IV SOLN
1.0000 ug/kg/min | INTRAVENOUS | Status: DC
  Administered 2012-05-19: 1.5 ug/kg/min via INTRAVENOUS
  Filled 2012-05-18 (×2): qty 20

## 2012-05-18 MED ORDER — AMIODARONE HCL IN DEXTROSE 360-4.14 MG/200ML-% IV SOLN
30.0000 mg/h | INTRAVENOUS | Status: DC
  Administered 2012-05-19 – 2012-05-24 (×8): 30 mg/h via INTRAVENOUS
  Filled 2012-05-18 (×27): qty 200

## 2012-05-18 MED ORDER — POTASSIUM CHLORIDE 20 MEQ/15ML (10%) PO LIQD
ORAL | Status: AC
  Filled 2012-05-18: qty 30

## 2012-05-18 MED ORDER — SODIUM CHLORIDE 0.9 % IV SOLN
1.5000 g | INTRAVENOUS | Status: AC
  Administered 2012-05-18: 1.5 g via INTRAVENOUS
  Filled 2012-05-18: qty 1.5

## 2012-05-18 MED ORDER — SODIUM CHLORIDE 0.9 % IV SOLN
1.0000 ug/kg/min | INTRAVENOUS | Status: DC
  Administered 2012-05-18: 1 ug/kg/min via INTRAVENOUS

## 2012-05-18 MED ORDER — POTASSIUM CHLORIDE 10 MEQ/50ML IV SOLN
10.0000 meq | INTRAVENOUS | Status: AC
  Administered 2012-05-18 – 2012-05-19 (×3): 10 meq via INTRAVENOUS
  Filled 2012-05-18 (×3): qty 50

## 2012-05-18 MED ORDER — CHLORHEXIDINE GLUCONATE 0.12 % MT SOLN
15.0000 mL | Freq: Two times a day (BID) | OROMUCOSAL | Status: DC
  Administered 2012-05-18 – 2012-05-23 (×10): 15 mL via OROMUCOSAL
  Filled 2012-05-18 (×10): qty 15

## 2012-05-18 MED ORDER — AMIODARONE LOAD VIA INFUSION
150.0000 mg | Freq: Once | INTRAVENOUS | Status: AC
  Administered 2012-05-18: 150 mg via INTRAVENOUS

## 2012-05-18 MED ORDER — FENTANYL BOLUS VIA INFUSION
50.0000 ug | INTRAVENOUS | Status: DC | PRN
  Filled 2012-05-18: qty 50

## 2012-05-18 MED ORDER — FENTANYL CITRATE 0.05 MG/ML IJ SOLN
100.0000 ug | Freq: Once | INTRAMUSCULAR | Status: AC
  Administered 2012-05-18: 100 ug via INTRAVENOUS
  Filled 2012-05-18: qty 2

## 2012-05-18 MED ORDER — SODIUM CHLORIDE 0.9 % IV SOLN
INTRAVENOUS | Status: DC
  Administered 2012-05-18 – 2012-05-24 (×2): via INTRAVENOUS

## 2012-05-18 MED ORDER — DEXTROSE 50 % IV SOLN
INTRAVENOUS | Status: AC | PRN
  Administered 2012-05-18: 1 via INTRAVENOUS

## 2012-05-18 MED ORDER — NOREPINEPHRINE BITARTRATE 1 MG/ML IJ SOLN
0.5000 ug/min | INTRAVENOUS | Status: DC
  Administered 2012-05-18: 5 ug/min via INTRAVENOUS
  Filled 2012-05-18: qty 4

## 2012-05-18 MED ORDER — MIDAZOLAM HCL 5 MG/ML IJ SOLN: 2.0000 mg | Freq: Once | INTRAMUSCULAR | Status: AC | PRN

## 2012-05-18 MED ORDER — SODIUM CHLORIDE 0.9 % IV SOLN
25.0000 ug/h | INTRAVENOUS | Status: DC
  Administered 2012-05-20: 50 ug/h via INTRAVENOUS
  Filled 2012-05-18 (×2): qty 50

## 2012-05-18 MED ORDER — POTASSIUM CHLORIDE 20 MEQ/15ML (10%) PO LIQD
40.0000 meq | Freq: Once | ORAL | Status: AC
  Administered 2012-05-18: 40 meq
  Filled 2012-05-18: qty 30

## 2012-05-18 MED ORDER — ARTIFICIAL TEARS OP OINT
1.0000 "application " | TOPICAL_OINTMENT | Freq: Three times a day (TID) | OPHTHALMIC | Status: DC
  Administered 2012-05-18 – 2012-05-21 (×8): 1 via OPHTHALMIC
  Filled 2012-05-18 (×3): qty 3.5

## 2012-05-18 MED ORDER — EPINEPHRINE HCL 0.1 MG/ML IJ SOLN
INTRAMUSCULAR | Status: AC | PRN
  Administered 2012-05-18 (×6): 1 mg via INTRAVENOUS

## 2012-05-18 MED ORDER — SODIUM CHLORIDE 0.9 % IV BOLUS (SEPSIS)
2000.0000 mL | Freq: Once | INTRAVENOUS | Status: AC
  Administered 2012-05-18: 2000 mL via INTRAVENOUS
  Filled 2012-05-18: qty 2000

## 2012-05-18 MED ORDER — MIDAZOLAM HCL 2 MG/2ML IJ SOLN
2.0000 mg | Freq: Once | INTRAMUSCULAR | Status: AC
  Administered 2012-05-18: 2 mg via INTRAVENOUS
  Filled 2012-05-18: qty 2

## 2012-05-18 MED ORDER — INSULIN ASPART 100 UNIT/ML ~~LOC~~ SOLN: 2.0000 [IU] | SUBCUTANEOUS | Status: DC

## 2012-05-18 MED FILL — Medication: Qty: 1 | Status: AC

## 2012-05-18 NOTE — Code Documentation (Signed)
Compressions initiated at arrival, continued until 1st shock. 1st shock at 1020, 120j. Compressions continued.

## 2012-05-18 NOTE — ED Notes (Signed)
Pt was placed on Artic Sun pads and cooling process began; one pair of silver in color earrings handed to pt's husband

## 2012-05-18 NOTE — Procedures (Signed)
Arterial Catheter Insertion Procedure Note [name] [id number] 20-Oct-1940  Procedure: Insertion of Arterial Catheter  Indications: Blood pressure monitoring and Frequent blood sampling  Procedure Details Consent: Unable to obtain consent because of altered level of consciousness. Time Out: Verified patient identification, verified procedure, site/side was marked, verified correct patient position, special equipment/implants available, medications/allergies/relevent history reviewed, required imaging and test results available.  Performed  Maximum sterile technique was used including antiseptics, cap, gloves, gown, hand hygiene, mask and sheet. Skin prep: Chlorhexidine; local anesthetic administered 20 gauge catheter was inserted into right radial artery using the Seldinger technique.  Evaluation Blood flow good; BP tracing good. Complications: No apparent complications.   Winferd Humphrey 05/18/2012

## 2012-05-18 NOTE — Code Documentation (Signed)
Pulse present. VT rate 140.

## 2012-05-18 NOTE — Code Documentation (Signed)
Pulse and rhythm check. No pulse. Shock 120J. Compressions continued.

## 2012-05-18 NOTE — Code Documentation (Signed)
Vtach Rate 130. Pulseless. Shock 150J. Compressions continued.

## 2012-05-18 NOTE — ED Notes (Signed)
Family at bedside, patient's husband understands patient's condition and is aware of procedures done at this time, patient's family offered comfort measures.

## 2012-05-18 NOTE — Code Documentation (Signed)
Pulse and rhythm check. Pulseless. Shock 200J. Pulse recovered. Sinus rhythm, rate 76.

## 2012-05-18 NOTE — Progress Notes (Signed)
ANTIBIOTIC CONSULT NOTE - INITIAL  Pharmacy Consult for unasyn Indication: ?aspiration PNA  No Known Allergies  Patient Measurements: Weight: 165 lb 9.1 oz (75.1 kg)  Vital Signs: Temp: 97.2 F (36.2 C) (03/13 1314) Temp src: Core (Comment) (03/13 1314) BP: 124/83 mmHg (03/13 1300) Pulse Rate: 72 (03/13 1300) Intake/Output from previous day:   Intake/Output from this shift:    Labs:  Recent Labs  05/18/12 1100  WBC 9.9  HGB 9.0*  PLT 283  CREATININE 0.84   The CrCl is unknown because both a height and weight (above a minimum accepted value) are required for this calculation. No results found for this basename: VANCOTROUGH, VANCOPEAK, VANCORANDOM, GENTTROUGH, GENTPEAK, GENTRANDOM, TOBRATROUGH, TOBRAPEAK, TOBRARND, AMIKACINPEAK, AMIKACINTROU, AMIKACIN,  in the last 72 hours   Microbiology: No results found for this or any previous visit (from the past 720 hour(s)).  Medical History: Past Medical History  Diagnosis Date  . Anemia   . Atrial fibrillation     A.fib/flutter: s/p MAZE 2008, DCCV 2011, on coumadin; previously on flecainide, but dc'd due to worsening EF  . Diverticulitis   . Valvular heart disease     bicuspid AV/severe AS, mitral regurgitation, & aneurysm of the ascending thoracic aorta (s/p AV replacement, MV repair, resection and grafting of the ascending thoracic aortic aneurysm 2008  . CVA (cerebral vascular accident)     2008 - felt due to oscillating calcium on aortic valve  . Carcinoma in situ of vulva   . Fibroid   . Ovarian cyst, left 2008-2009  . Osteoporosis   . Diabetes mellitus   . Hypertension   . GERD (gastroesophageal reflux disease)   . Chronic systolic CHF (congestive heart failure)     EF 35%  . Ischemic cardiomyopathy     EF 35%  . Coronary artery disease     Occluded LM 2/2 previous aortic root surgery; s/p 2v CABG 2010 LIMA to LAD, SVG to OM; Cath 03/11/12 patent SVG to OM, atretic LIMA to LAD, patent RCA, occluded native LM, EF  35%   . Shortness of breath   . Arthritis     OSTEO  . Fracture of lumbar spine     Medications:  Anti-infectives   Start     Dose/Rate Route Frequency Ordered Stop   05/18/12 1930  ampicillin-sulbactam (UNASYN) 1.5 g in sodium chloride 0.9 % 50 mL IVPB     1.5 g 100 mL/hr over 30 Minutes Intravenous Every 6 hours 05/18/12 1317     05/18/12 1330  ampicillin-sulbactam (UNASYN) 1.5 g in sodium chloride 0.9 % 50 mL IVPB     1.5 g 100 mL/hr over 30 Minutes Intravenous To Emergency Dept 05/18/12 1317 05/19/12 1330     Assessment: 49 yof s/p CPR initiated on arctic sun protocol. To start empiric unasyn for possible aspiration pneumonia. She is currently afebrile and WBC is WNL. Good renal fxn.   Goal of Therapy:  Eradication of infection  Plan:  1. Unasyn 1.5gm IV Q6H 2. F/u renal fxn, C&S, clinical status   Rumbarger, Rande Lawman 05/18/2012,1:18 PM

## 2012-05-18 NOTE — ED Provider Notes (Addendum)
History     CSN: [id number]  Arrival date & time 05/18/12  1015   First MD Initiated Contact with Patient 05/18/12 1019      No chief complaint on file.   (Consider location/radiation/quality/duration/timing/severity/associated sxs/prior treatment) Patient is a 72 y.o. female presenting with syncope. The history is provided by the spouse. The history is limited by the condition of the patient.  Loss of Consciousness  This is a new problem. Episode onset: 1-2 min prior to arrival. The problem occurs constantly. The problem has not changed since onset.Associated with: pt was riding in the car with her husband without any complaints and abruptly became unresponsive.  he was near the hospital and drove to the ER bay honking the horn.  on arrival pt was pulseless and apneic. Associated symptoms comments: She had no complaints. She has tried nothing for the symptoms. Her past medical history is significant for CAD and DM. Past medical history comments: a.fib recently started on tikosyn, cabg x2.    Past Medical History  Diagnosis Date  . Anemia   . Atrial fibrillation     A.fib/flutter: s/p MAZE 2008, DCCV 2011, on coumadin; previously on flecainide, but dc'd due to worsening EF  . Diverticulitis   . Valvular heart disease     bicuspid AV/severe AS, mitral regurgitation, & aneurysm of the ascending thoracic aorta (s/p AV replacement, MV repair, resection and grafting of the ascending thoracic aortic aneurysm 2008  . CVA (cerebral vascular accident)     2008 - felt due to oscillating calcium on aortic valve  . Carcinoma in situ of vulva   . Fibroid   . Ovarian cyst, left 2008-2009  . Osteoporosis   . Diabetes mellitus   . Hypertension   . GERD (gastroesophageal reflux disease)   . Chronic systolic CHF (congestive heart failure)     EF 35%  . Ischemic cardiomyopathy     EF 35%  . Coronary artery disease     Occluded LM 2/2 previous aortic root surgery; s/p 2v CABG 2010 LIMA to LAD,  SVG to OM; Cath 03/11/12 patent SVG to OM, atretic LIMA to LAD, patent RCA, occluded native LM, EF 35%   . Shortness of breath   . Arthritis     OSTEO  . Fracture of lumbar spine     Past Surgical History  Procedure Laterality Date  . Foot surgery      removed neuroma  . Aortic root replacement  2008    homograft  . Mv repair  2008    due to severe MR  . Resection and grafting of ascending thoracic aortic    . Aneurysm and left sided maze  2008  . Coronary artery bypass graft  4/10    LIMA to LAD, SVG to OM  . Wide excision of vulva      CA INSITU    Family History  Problem Relation Age of Onset  . Hypertension Mother   . Breast cancer Maternal Aunt     age 57's  . Cancer Paternal Grandmother     COLON    History  Substance Use Topics  . Smoking status: Former Smoker    Quit date: 03/09/1983  . Smokeless tobacco: Never Used  . Alcohol Use: No    OB History   Grav Para Term Preterm Abortions TAB SAB Ect Mult Living   0               Review of Systems  Unable to  perform ROS Cardiovascular: Positive for syncope.    Allergies  Review of patient's allergies indicates no known allergies.  Home Medications   Current Outpatient Rx  Name  Route  Sig  Dispense  Refill  . Cholecalciferol (VITAMIN D3) 50000 UNITS CAPS   Oral   Take 1 tablet by mouth once a week.   12 capsule   3   . Cyanocobalamin (VITAMIN B-12) 2500 MCG SUBL   Sublingual   Place 1 tablet under the tongue 3 (three) times a week. Dissolves 1 tablet under the tongue on Monday, Wednesday, and Friday         . dofetilide (TIKOSYN) 125 MCG capsule   Oral   Take 3 capsules (375 mcg total) by mouth every 12 (twelve) hours.   180 capsule   6   . furosemide (LASIX) 20 MG tablet   Oral   Take 20 mg by mouth every morning.         . magnesium oxide (MAG-OX 400) 400 MG tablet   Oral   Take 2 tablets (800 mg total) by mouth 2 (two) times daily.   60 tablet   3   . metFORMIN (GLUCOPHAGE-XR)  500 MG 24 hr tablet   Oral   Take 500-1,500 mg by mouth 2 (two) times daily with a meal. Takes 1 tablet in am and 3 tablets in pm         . metoprolol succinate (TOPROL-XL) 100 MG 24 hr tablet   Oral   Take 100 mg by mouth every morning.          . metoprolol succinate (TOPROL-XL) 50 MG 24 hr tablet   Oral   Take 50 mg by mouth every morning. Take with or immediately following a meal.         . MICARDIS 20 MG tablet      TAKE 1/2 TABLET BY MOUTH EVERY DAY   30 tablet   1   . omeprazole (PRILOSEC OTC) 20 MG tablet   Oral   Take 20 mg by mouth daily after breakfast.          . pioglitazone (ACTOS) 45 MG tablet   Oral   Take 45 mg by mouth every morning.         Marland Kitchen spironolactone (ALDACTONE) 12.5 mg TABS   Oral   Take 0.5 tablets (12.5 mg total) by mouth daily.   15 tablet   6   . telmisartan (MICARDIS) 20 MG tablet   Oral   Take 10 mg by mouth every morning.          . warfarin (COUMADIN) 2 MG tablet   Oral   Take 2 mg by mouth 3 (three) times a week. Monday, Wednesday and Friday         . warfarin (COUMADIN) 4 MG tablet   Oral   Take 4 mg by mouth 4 (four) times a week. Tuesday, Thursday, Saturday and Sunday           There were no vitals taken for this visit.  Physical Exam  Nursing note and vitals reviewed. Constitutional: She appears well-developed and well-nourished.  Unresponsive, pale and apneic  HENT:  Head: Normocephalic and atraumatic.  Mouth/Throat: Oropharynx is clear and moist.  Eyes:  Pupils 75mm and equal but non-reactive  Neck: Normal range of motion. Neck supple.  Cardiovascular:  No palpable pulse  Pulmonary/Chest:  After intubation, bilateral breath sounds  Abdominal: Soft. She exhibits distension. She exhibits no mass.  Musculoskeletal: She exhibits no edema and no tenderness.  Neurological: She is unresponsive.  Skin: She is diaphoretic. There is pallor.  cool    ED Course  Procedures (including critical care  time)  Labs Reviewed  GLUCOSE, CAPILLARY - Abnormal; Notable for the following:    Glucose-Capillary 251 (*)    All other components within normal limits  CBC WITH DIFFERENTIAL  COMPREHENSIVE METABOLIC PANEL  PROTIME-INR  APTT  TROPONIN I   No results found.   Date: 05/18/2012  Rate: 125  Rhythm: sinus tachycardia with frequent PVCs  QRS Axis: normal  Intervals: normal  ST/T Wave abnormalities: nonspecific ST/T changes  Conduction Disutrbances:right bundle branch block  Narrative Interpretation:   Old EKG Reviewed: changes noted   1. Ventricular fibrillation   2. Cardiac arrest     CRITICAL CARE Performed by: Blanchie Dessert   Total critical care time: 60  Critical care time was exclusive of separately billable procedures and treating other patients.  Critical care was necessary to treat or prevent imminent or life-threatening deterioration.  Critical care was time spent personally by me on the following activities: development of treatment plan with patient and/or surrogate as well as nursing, discussions with consultants, evaluation of patient's response to treatment, examination of patient, obtaining history from patient or surrogate, ordering and performing treatments and interventions, ordering and review of laboratory studies, ordering and review of radiographic studies, pulse oximetry and re-evaluation of patient's condition.  Cardiopulmonary Resuscitation (CPR) Procedure Note Directed/Performed by: Blanchie Dessert I personally directed ancillary staff and/or performed CPR in an effort to regain return of spontaneous circulation and to maintain cardiac, neuro and systemic perfusion.   INTUBATION Performed by: Blanchie Dessert  Required items: required blood products, implants, devices, and special equipment available Patient identity confirmed: provided demographic data and hospital-assigned identification number Time out: Immediately prior to procedure a  "time out" was called to verify the correct patient, procedure, equipment, support staff and site/side marked as required.  Indications: Cardiac arrest   Intubation method: Glidescope Laryngoscopy   Preoxygenation: BVM  Sedatives: None  Paralytic: None   Tube Size: 7.5 cuffed  Post-procedure assessment: chest rise and ETCO2 monitor Breath sounds: equal and absent over the epigastrium Tube secured with: ETT holder Chest x-ray interpreted by radiologist and me.  Chest x-ray findings: endotracheal tube in appropriate position  Patient tolerated the procedure well with no immediate complications.  CENTRAL LINE Performed by: Blanchie Dessert Consent: The procedure was performed in an emergent situation. Required items: required blood products, implants, devices, and special equipment available Patient identity confirmed: arm band and provided demographic data Time out: Immediately prior to procedure a "time out" was called to verify the correct patient, procedure, equipment, support staff and site/side marked as required. Indications: vascular access Anesthesia: local infiltration Local anesthetic: lidocaine 1% with epinephrine Anesthetic total: 3 ml Patient sedated: no Preparation: skin prepped with 2% chlorhexidine Skin prep agent dried: skin prep agent completely dried prior to procedure Sterile barriers: all five maximum sterile barriers used - cap, mask, sterile gown, sterile gloves, and large sterile sheet Hand hygiene: hand hygiene performed prior to central venous catheter insertion  Location details: Right internal jugular   Catheter type: triple lumen Catheter size: 8 Fr Pre-procedure: landmarks identified Ultrasound guidance: Yes  Successful placement: yes Post-procedure: line sutured and dressing applied Assessment: blood return through all parts, free fluid flow, placement verified by x-ray and no pneumothorax on x-ray Patient tolerance: Patient tolerated the  procedure well with no immediate complications.  MDM   Patient with a history of A. fib and CABG x2 was recently started on Tikosyn approximately one week ago and had been doing well. She was riding with her husband in the car and became unresponsive. Within 1-2 minutes he had her in the emergency department and she was pulseless and affect. CPR was started and patient was intubated. Patient received 8 rounds of epinephrine as well as calcium and D5. Patient had persistent V. fib despite multiple attempts at defibrillation. Patient was then given 300 mg of amiodarone with the last defibrillation had return of spontaneous circulation. Patient went into a sinus rhythm with a blood pressure initially in the 150s over 70s which then decreased to 70/40 and remained persistent. Patient was given magnesium and started on an epinephrine drip. Cardiology was called as her new EKG showed concerning elevation in anterior leads. The EKG was discussed with cardiology and they felt it more likely is coming from reaction from [place]. She will go straight to the CCU. As soon as return of spontaneous circulation was achieved cooling protocol was initiated.        Blanchie Dessert, MD 05/18/12 1106  Blanchie Dessert, MD 05/18/12 1141

## 2012-05-18 NOTE — Code Documentation (Signed)
Shock 120J. Compressions continued.

## 2012-05-18 NOTE — Code Documentation (Signed)
Shock 120J. VT.

## 2012-05-18 NOTE — Code Documentation (Signed)
Vtach. Shock 150J. Compressions continued.

## 2012-05-18 NOTE — ED Notes (Signed)
Pt arrived via [place], transferring from [facility] pt already in gown, placed on monitor, continuous pulse oximetry and blood pressure cuff; pt already intubated and RT maintaining airway; EKGs also shown to cardiologist at bedside

## 2012-05-18 NOTE — ED Notes (Signed)
Foley temp catheter was placed in pt at [facility] ED

## 2012-05-18 NOTE — ED Provider Notes (Signed)
Patient transferred from [facility] long hospital after resuscitation from ventricular fibrillation.  Patient accepted by cardiology but intensive care unit not ready.  She arrived by [facility] was intubated and unresponsive.  Patient initially had blood pressure 128/70.  Breath sounds are equal bilaterally.  [facility] cardiology here to evaluate.  Dot Lanes, MD 05/18/12 [phone]

## 2012-05-18 NOTE — Code Documentation (Signed)
Pulse an rhythm check. 2nd shock at 120J. Compressions continued.

## 2012-05-18 NOTE — Consult Note (Signed)
CARDIOLOGY CONSULT NOTE  Patient ID: KHADIJA BUDZYNSKI MRN: [id number] DOB/AGE: 1940-10-06 72 y.o.  Admit date: 05/18/2012 Primary Physician   Scarlette Calico, MD Primary Cardiologist  BC/JA   Chief Complaint    Cardiac arrest, long QT  HPI:  Ms Seiser is a 72 year old female with a history of atrial fibrillation on Tikosyn. She was in her USOH this am and had no complaints. She took her am meds as usual. She was riding with her husband when she slumped over and became unresponsive. He drove her to [facility] ER and she was in ventricular fibrillation. He estimates it took him 6-7 minutes to get to the ER. She required prolonged resuscitation, with multiple drugs and shocks, but pulses returned in about 45 minutes. She was transferred to [facility] ER. In the ER she is intubated and sedated. She was started on Nimbex. She is also on Levophed for pressure support.    Past Medical History  Diagnosis Date  . Anemia   . Atrial fibrillation     A.fib/flutter: s/p MAZE 2008, DCCV 2011, on coumadin; previously on flecainide, but dc'd due to worsening EF  . Diverticulitis   . Valvular heart disease     bicuspid AV/severe AS, mitral regurgitation, & aneurysm of the ascending thoracic aorta (s/p AV replacement, MV repair, resection and grafting of the ascending thoracic aortic aneurysm 2008  . CVA (cerebral vascular accident)     2008 - felt due to oscillating calcium on aortic valve  . Carcinoma in situ of vulva   . Fibroid   . Ovarian cyst, left 2008-2009  . Osteoporosis   . Diabetes mellitus   . Hypertension   . GERD (gastroesophageal reflux disease)   . Chronic systolic CHF (congestive heart failure)     EF 35%  . Ischemic cardiomyopathy     EF 35%  . Coronary artery disease     Occluded LM 2/2 previous aortic root surgery; s/p 2v CABG 2010 LIMA to LAD, SVG to OM; Cath 03/11/12 patent SVG to OM, atretic LIMA to LAD, patent RCA, occluded native LM, EF 35%   . Shortness of breath   . Arthritis     OSTEO    . Fracture of lumbar spine     Past Surgical History  Procedure Laterality Date  . Foot surgery      removed neuroma  . Aortic root replacement  2008    homograft  . Mv repair  2008    due to severe MR  . Resection and grafting of ascending thoracic aortic    . Aneurysm and left sided maze  2008  . Coronary artery bypass graft  4/10    LIMA to LAD, SVG to OM  . Wide excision of vulva      CA INSITU    No Known Allergies No current facility-administered medications on file prior to encounter.   Current Outpatient Prescriptions on File Prior to Encounter  Medication Sig Dispense Refill  . Cholecalciferol (VITAMIN D3) 50000 UNITS CAPS Take 1 tablet by mouth once a week.  12 capsule  3  . Cyanocobalamin (VITAMIN B-12) 2500 MCG SUBL Place 1 tablet under the tongue 3 (three) times a week. Dissolves 1 tablet under the tongue on Monday, Wednesday, and Friday      . dofetilide (TIKOSYN) 125 MCG capsule Take 3 capsules (375 mcg total) by mouth every 12 (twelve) hours.  180 capsule  6  . furosemide (LASIX) 20 MG tablet Take 20  mg by mouth every morning.      . magnesium oxide (MAG-OX 400) 400 MG tablet Take 2 tablets (800 mg total) by mouth 2 (two) times daily.  60 tablet  3  . metFORMIN (GLUCOPHAGE-XR) 500 MG 24 hr tablet Take 500-1,500 mg by mouth 2 (two) times daily with a meal. Takes 1 tablet in am and 3 tablets in pm      . metoprolol succinate (TOPROL-XL) 100 MG 24 hr tablet Take 100 mg by mouth every morning.       . metoprolol succinate (TOPROL-XL) 50 MG 24 hr tablet Take 50 mg by mouth every morning. Take with or immediately following a meal.      . MICARDIS 20 MG tablet TAKE 1/2 TABLET BY MOUTH EVERY DAY  30 tablet  1  . omeprazole (PRILOSEC OTC) 20 MG tablet Take 20 mg by mouth daily after breakfast.       . pioglitazone (ACTOS) 45 MG tablet Take 45 mg by mouth every morning.      Marland Kitchen spironolactone (ALDACTONE) 12.5 mg TABS Take 0.5 tablets (12.5 mg total) by mouth daily.  15 tablet   6  . telmisartan (MICARDIS) 20 MG tablet Take 10 mg by mouth every morning.       . warfarin (COUMADIN) 2 MG tablet Take 2 mg by mouth 3 (three) times a week. Monday, Wednesday and Friday      . warfarin (COUMADIN) 4 MG tablet Take 4 mg by mouth 4 (four) times a week. Tuesday, Thursday, Saturday and Sunday       History   Social History  . Marital Status: Married    Spouse Name: N/A    Number of Children: N/A  . Years of Education: N/A   Occupational History  . Retired Radiation protection practitioner    Social History Main Topics  . Smoking status: Former Smoker    Quit date: 03/09/1983  . Smokeless tobacco: Never Used  . Alcohol Use: No  . Drug Use: No  . Sexually Active: Not Currently    Birth Control/ Protection: Post-menopausal   Other Topics Concern  . Not on file   Social History Narrative    Her mother died at age 31 from a stroke.  Her father died at age 59, had chronic obstructive pulmonary disease and colon disease.   No regular exercise    Family History  Problem Relation Age of Onset  . Hypertension Mother   . Breast cancer Maternal Aunt     age 74's  . Cancer Paternal Grandmother     COLON      Physical Exam: Blood pressure 124/83, pulse 77, temperature 97.2 F (36.2 C), temperature source Core (Comment), resp. rate 20, height 5\' 6"  (1.676 m), weight 165 lb 9.1 oz (75.1 kg), SpO2 78.00%.  General: Well developed, well nourished, elderly female sedated on the vent. Head: Eyes pupils equal and round, not reactive (may be due to meds), No xanthomas.   Normocephalic and atraumatic  Lungs: Clear anteriorly to auscultation. Heart: HRRR S1 S2, without MRG.  Pulses are 2+ & equal. No carotid bruit. No JVD but has central line on right. Abdomen: Bowel sounds are present but decreased, abdomen soft and distended, slightly tympanic, without masses or  hernias noted. Msk: no joint deformities or effusions Extremities: No clubbing, cyanosis or edema.    Skin:  No rashes or lesions  noted. Neuro: Alert and oriented X 0 but is on Nimbex. Psych:  Unable to assess.  Labs: Lab  Results  Component Value Date   BUN 19 05/18/2012   Lab Results  Component Value Date   CREATININE 1.05 05/18/2012   Lab Results  Component Value Date   NA 136 05/18/2012   K 4.4 05/18/2012   CL 102 05/18/2012   CO2 16* 05/18/2012   Lab Results  Component Value Date   TROPONINI <0.30 05/18/2012   Lab Results  Component Value Date   WBC 9.9 05/18/2012   HGB 9.0* 05/18/2012   HCT 30.1* 05/18/2012   MCV 88.5 05/18/2012   PLT 283 05/18/2012   Lab Results  Component Value Date   CHOL 200 04/08/2011   HDL 62.70 04/08/2011   LDLCALC 115* 04/08/2011   LDLDIRECT 132.3 10/17/2008   TRIG 110.0 04/08/2011   CHOLHDL 3 04/08/2011   Lab Results  Component Value Date   ALT 149* 05/18/2012   AST 210* 05/18/2012   ALKPHOS 105 05/18/2012   BILITOT 0.2* 05/18/2012    Radiology:  Dg Chest Port 1 View  05/18/2012  *RADIOLOGY REPORT*  Clinical Data:  Intubation, arrest.  PORTABLE CHEST - 1 VIEW  Comparison: 02/21/2012  Findings: Endotracheal tube is 4-5 cm above the carina.  Right central line tip in the SVC.  No pneumothorax.  Diffuse bilateral airspace disease with cardiomegaly.  No effusions.  No acute bony abnormality.  IMPRESSION: Support devices in expected position as above. No pneumothorax.  Diffuse bilateral airspace disease.   Original Report Authenticated By: Rolm Baptise, M.D.    EKG:  18-May-2012 12:15:10   SINUS RHYTHM ~ normal P axis, V-rate 50- 99 NONSPECIFIC IVCD WITH LAD ~ QRSd >139mS & LAD BORDERLINE R WAVE PROGRESSION, ANTERIOR LEADS ~ R < 0.59mV Abnormal ECG 77mm/s 47mm/mV 150Hz  8.0.1 12SL 35 CID: [id number] Referred by: Confirmed By: Lacretia Leigh MD Vent. rate 77 BPM PR interval 188 ms QRS duration 124 ms QT/QTc 464/525 ms P-R-T axes 30 -46 93   ASSESSMENT AND PLAN:   Principal Problem:   Sudden cardiac arrest Active Problems:   Atrial fibrillation   Mitral valve disease   Type II  or unspecified type diabetes mellitus without mention of complication, uncontrolled   GERD (gastroesophageal reflux disease)   Ischemic cardiomyopathy   Valvular heart disease-bicuspid aortic valve s/p Mechanical replacement//MV repair   Ventricular fibrillation   Acute respiratory failure   Warfarin-induced coagulopathy   Acute encephalopathy   Metabolic acidosis   Sustained ventricular fibrillation    Signed: Rosaria Ferries 05/18/2012, 1:53 PM  History and all data above reviewed.  Patient examined.  I agree with the findings as above.  She presents with pulseless arrest with an initial rhythm of ventricular fibrillation.  She was treated with 8 rounds of epinephrine, calcium, D5, and amiodarone and multiple defibrillations with subsequent return of pulse and rhythm at 45 minutes.  Intermittent she was said to have persistent ventricular fibrillation.  EKG demonstarted wide QRS with prolonged QT.  She had been started on Tikosyn Feb for atrial fib.  She was started on cooling protocol, is intubated and sedated and is being admitted by CCM.  Current rhythm is NSR with narrow complex and QT prolongation.  Of note magnesium, calcium and potassium are WNL.  The patient exam reveals COR:RRR,  Lungs: clear  ,  Abd: Decreased BS and mildly distended, no rebound no guarding, Ext Mild edema  .  All available labs, radiology testing, previous records reviewed. Agree with documented assessment and plan. The patient will be admitted for cooling, hemodynamic support and ventilatory  support per CCM.  Her INR is supratherapeutic and will be administered per pharmacy with goal INR still 2.5 - 3.5.  She will be more coagulopathic with cooling. I do not suspect an acute coronary synrdrome.  This was likely v fib related to Tikosyn.  We will hold this and avoid all QT prolonging drugs.    Jeneen Rinks Hochrein  2:42 PM  05/18/2012

## 2012-05-18 NOTE — Progress Notes (Signed)
Chaplain consulted to provide support with pt's husband prior to transfer to [facility].    Husband had been informed of pt's condition.  Chaplain provided emotional support in ED.

## 2012-05-18 NOTE — ED Notes (Signed)
Lactic acid results given to Dr. Audie Pinto, elevated

## 2012-05-18 NOTE — Progress Notes (Signed)
RT note: Pt. arrived via [facility] intubated with 7.5@21 , b/l b.s. noted, transport vent settings: 480vt(8cc/kg IBW)/18r/+5p/100%fi02, placed on Servo-I @ same settings placed A-Line/CVP per CCM orders.

## 2012-05-18 NOTE — ED Notes (Signed)
2 gold in color rings placed in specimen bottle with pt's label handed to pt's husband which is at bedside

## 2012-05-18 NOTE — H&P (Signed)
PULMONARY  / CRITICAL CARE MEDICINE  Name: [name] MRN: [id number] DOB: [birth year]    ADMISSION DATE:  05/18/2012 CONSULTATION DATE:  3/13  REFERRING MD :  Maryan Rued  CHIEF COMPLAINT:  Cardiac arrest   BRIEF PATIENT DESCRIPTION:  72 year old female w/ h/o ICM (EF 35%), AF, severe AS and MR. Recently started on Tikosyn. Was in usual state of health until 3/13 when she was riding in the car and became suddenly unresponsive. Her husband estimates 6 minutes driving to ER. On arrival found to be in VF arrest. Team estimates 45 minutes of ACLS to ROSC. PCCM asked to admit.   SIGNIFICANT EVENTS / STUDIES:  CT head 3/12>>> Echo 3/12>>>  LINES / TUBES: OETT 3/12>>> Right IJ CVL 3/12>>> Right rad aline 3/12>>>  CULTURES: Sputum 3/12>>>  ANTIBIOTICS: Unasyn 3/12>>>  HISTORY OF PRESENT ILLNESS:   See above   PAST MEDICAL HISTORY :  Past Medical History  Diagnosis Date  . Anemia   . Atrial fibrillation     A.fib/flutter: s/p MAZE 2008, DCCV 2011, on coumadin; previously on flecainide, but dc'd due to worsening EF  . Diverticulitis   . Valvular heart disease     bicuspid AV/severe AS, mitral regurgitation, & aneurysm of the ascending thoracic aorta (s/p AV replacement, MV repair, resection and grafting of the ascending thoracic aortic aneurysm 2008  . CVA (cerebral vascular accident)     2008 - felt due to oscillating calcium on aortic valve  . Carcinoma in situ of vulva   . Fibroid   . Ovarian cyst, left 2008-2009  . Osteoporosis   . Diabetes mellitus   . Hypertension   . GERD (gastroesophageal reflux disease)   . Chronic systolic CHF (congestive heart failure)     EF 35%  . Ischemic cardiomyopathy     EF 35%  . Coronary artery disease     Occluded LM 2/2 previous aortic root surgery; s/p 2v CABG 2010 LIMA to LAD, SVG to OM; Cath 03/11/12 patent SVG to OM, atretic LIMA to LAD, patent RCA, occluded native LM, EF 35%   . Shortness of breath   . Arthritis     OSTEO  .  Fracture of lumbar spine    Past Surgical History  Procedure Laterality Date  . Foot surgery      removed neuroma  . Aortic root replacement  2008    homograft  . Mv repair  2008    due to severe MR  . Resection and grafting of ascending thoracic aortic    . Aneurysm and left sided maze  2008  . Coronary artery bypass graft  4/10    LIMA to LAD, SVG to OM  . Wide excision of vulva      CA INSITU   Prior to Admission medications   Medication Sig Start Date End Date Taking? Authorizing Provider  Cholecalciferol (VITAMIN D3) 50000 UNITS CAPS Take 1 tablet by mouth once a week. 06/02/10   Janith Lima, MD  Cyanocobalamin (VITAMIN B-12) 2500 MCG SUBL Place 1 tablet under the tongue 3 (three) times a week. Dissolves 1 tablet under the tongue on Monday, Wednesday, and Friday    Historical Provider, MD  dofetilide (TIKOSYN) 125 MCG capsule Take 3 capsules (375 mcg total) by mouth every 12 (twelve) hours. 04/22/12   Aurora Mask, PA-C  furosemide (LASIX) 20 MG tablet Take 20 mg by mouth every morning.    Historical Provider, MD  magnesium oxide (MAG-OX 400) 400 MG  tablet Take 2 tablets (800 mg total) by mouth 2 (two) times daily. 04/28/12   Thompson Grayer, MD  metFORMIN (GLUCOPHAGE-XR) 500 MG 24 hr tablet Take 500-1,500 mg by mouth 2 (two) times daily with a meal. Takes 1 tablet in am and 3 tablets in pm    Historical Provider, MD  metoprolol succinate (TOPROL-XL) 100 MG 24 hr tablet Take 100 mg by mouth every morning.  06/21/11   Lelon Perla, MD  metoprolol succinate (TOPROL-XL) 50 MG 24 hr tablet Take 50 mg by mouth every morning. Take with or immediately following a meal.    Historical Provider, MD  MICARDIS 20 MG tablet TAKE 1/2 TABLET BY MOUTH EVERY DAY 04/26/12   Lelon Perla, MD  omeprazole (PRILOSEC OTC) 20 MG tablet Take 20 mg by mouth daily after breakfast.     Historical Provider, MD  pioglitazone (ACTOS) 45 MG tablet Take 45 mg by mouth every morning.    Historical Provider, MD   spironolactone (ALDACTONE) 12.5 mg TABS Take 0.5 tablets (12.5 mg total) by mouth daily. 04/22/12   Aurora Mask, PA-C  telmisartan (MICARDIS) 20 MG tablet Take 10 mg by mouth every morning.     Historical Provider, MD  warfarin (COUMADIN) 2 MG tablet Take 2 mg by mouth 3 (three) times a week. Monday, Wednesday and Friday    Historical Provider, MD  warfarin (COUMADIN) 4 MG tablet Take 4 mg by mouth 4 (four) times a week. Tuesday, Thursday, Saturday and Sunday    Historical Provider, MD   No Known Allergies  FAMILY HISTORY:  Family History  Problem Relation Age of Onset  . Hypertension Mother   . Breast cancer Maternal Aunt     age 35's  . Cancer Paternal Grandmother     COLON   SOCIAL HISTORY:  reports that she quit smoking about 29 years ago. She has never used smokeless tobacco. She reports that she does not drink alcohol or use illicit drugs.  REVIEW OF SYSTEMS:  Unable   SUBJECTIVE:  Unresponsive on vent  VITAL SIGNS: Temp:  [93.8 F (34.3 C)-98.7 F (37.1 C)] 98.7 F (37.1 C) (03/13 1122) Pulse Rate:  [49-95] 73 (03/13 1130) Resp:  [18-24] 24 (03/13 1130) BP: (70-128)/(35-74) 128/70 mmHg (03/13 1244) SpO2:  [69 %-100 %] 100 % (03/13 1130) FiO2 (%):  [100 %] 100 % (03/13 1000) Weight:  [75.1 kg (165 lb 9.1 oz)] 75.1 kg (165 lb 9.1 oz) (03/13 1146) HEMODYNAMICS:   VENTILATOR SETTINGS: Vent Mode:  [-] PRVC FiO2 (%):  [100 %] 100 % Set Rate:  [18 bmp] 18 bmp Vt Set:  [480 mL] 480 mL PEEP:  [5 cmH20] 5 cmH20 Plateau Pressure:  [23 cmH20] 23 cmH20 INTAKE / OUTPUT: Intake/Output   None     PHYSICAL EXAMINATION: General:  Critically ill appearing white female, unresponsive on the vent  Neuro:  + posturing. Unresponsive  HEENT:  Orally intubated. Right IJ CVl in good position  Cardiovascular:  rrr Lungs:  Diffuse rhonchi/rales.  Abdomen:  Soft, non-tender. + bowel sounds  Musculoskeletal:  Intact  Skin:  Intact   LABS:  Recent Labs Lab 05/18/12 1100  05/18/12 1250  HGB 9.0*  --   WBC 9.9  --   PLT 283  --   NA 136  --   K 3.6  --   CL 105  --   CO2 15*  --   GLUCOSE 316*  --   BUN 17  --   CREATININE  0.84  --   CALCIUM 8.7  --   AST 210*  --   ALT 149*  --   ALKPHOS 105  --   BILITOT 0.2*  --   PROT 6.0  --   ALBUMIN 2.5*  --   APTT 44*  --   INR 5.05*  --   LATICACIDVEN  --  7.87*  TROPONINI <0.30  --     Recent Labs Lab 05/18/12 1046  GLUCAP 251*    CXR: diffuse pulmonary infiltrates. OETT in good position. CVL in good position   ASSESSMENT / PLAN:  PULMONARY A: Acute respiratory failure s/p cardiac arrest Diffuse pulmonary infiltrates: not clear if this is d/t edema or aspiration. No report of vomiting.  P:   Full vent support Sedation protocol  See ID section  CARDIOVASCULAR A:  VF arrest Afib  ICM w/ EF 35% Cardiogenic shock Severe AS and mitral valve disease.  Currently on low dose levophed.  P:  IVFs to get CVP goal 8-12 Levophed for MAP >65 Repeat lactate   RENAL A:   +anion gap metabolic acidosis P:   Continue resuscitation efforts F/u cmp Need to see Mg in setting of current arrest as this was VF  GASTROINTESTINAL A:   GERD Elevated LFTs: likely shock liver.  P:   PPI   HEMATOLOGIC A:   Chronic anemia Warfarin induced coagulopathy  P:  FFP Goal INR 2-2.5   INFECTIOUS A:  Possible aspiration  P:   PCT algorithm Sputum culture Empiric unasyn  ENDOCRINE A:  DM P:   ssi protocol   NEUROLOGIC A:  Acute encephalopathy Very high concern that this represents anoxic injury P:   Hypothermia protocol   TODAY'S SUMMARY:  This is a 55 YOF s/p VF arrest which was likely d/t Tikosyn. She was on coumadin for AF. INR >5, have discussed risks of bleeding associated with hypothermia related coagulopathy with her husband. He accepts this risk as her chances of neurological recovery seem slim at this point. We will give her FFP, admit her to CCU and initiate hypothermia  protocol   I spoke with the patient's husband at length, informed him of the risk of cooling a patient with a supra-therapeutic INR.  Also explained that this would be the best chance for her to preserve any sort of neuro function given prolonged down time.  He expressed understanding of this and was willing to take the risk to preserve neuro function.  I have personally obtained a history, examined the patient, evaluated laboratory and imaging results, formulated the assessment and plan and placed orders.  CRITICAL CARE: The patient is critically ill with multiple organ systems failure and requires high complexity decision making for assessment and support, frequent evaluation and titration of therapies, application of advanced monitoring technologies and extensive interpretation of multiple databases. Critical Care Time devoted to patient care services described in this note is 45 minutes.   Rush Farmer, M.D. Pulmonary and [place] Pager: [phone]  05/18/2012, 12:56 PM

## 2012-05-18 NOTE — ED Notes (Signed)
Patient report received from [facility], patient post cpr transfer from [facility] hospital.  Patient arrived in NSR with Epinephrine drip at 64mcg/min.  Patient with cold saline w/o, at this time on liter # 2.  Patient with mechanical ventilation at TV 480, 100%, 18/5, patient intubated with 7.5 cm et tube at 21 cm at the lip.  Patient with palpable radial pulses upon arrival.  Patient with NG to right nare, intial BP 122/78, HR 75.  Patient with right PIV #20  SL.  Patient moved to ED stretcher and monitors attached.

## 2012-05-19 DIAGNOSIS — I2589 Other forms of chronic ischemic heart disease: Secondary | ICD-10-CM

## 2012-05-19 DIAGNOSIS — I4891 Unspecified atrial fibrillation: Secondary | ICD-10-CM

## 2012-05-19 LAB — BASIC METABOLIC PANEL
BUN: 25 mg/dL — ABNORMAL HIGH (ref 6–23)
BUN: 27 mg/dL — ABNORMAL HIGH (ref 6–23)
BUN: 27 mg/dL — ABNORMAL HIGH (ref 6–23)
BUN: 29 mg/dL — ABNORMAL HIGH (ref 6–23)
CO2: 19 mEq/L (ref 19–32)
CO2: 18 mEq/L — ABNORMAL LOW (ref 19–32)
Calcium: 8.5 mg/dL (ref 8.4–10.5)
Calcium: 8.6 mg/dL (ref 8.4–10.5)
Calcium: 8.5 mg/dL (ref 8.4–10.5)
Calcium: 8.4 mg/dL (ref 8.4–10.5)
Chloride: 108 mEq/L (ref 96–112)
Chloride: 103 mEq/L (ref 96–112)
Chloride: 103 mEq/L (ref 96–112)
Creatinine, Ser: 1.04 mg/dL (ref 0.50–1.10)
Creatinine, Ser: 1.08 mg/dL (ref 0.50–1.10)
Creatinine, Ser: 1.03 mg/dL (ref 0.50–1.10)
GFR calc Af Amer: 61 mL/min — ABNORMAL LOW (ref >90)
GFR calc Af Amer: 60 mL/min — ABNORMAL LOW (ref >90)
GFR calc Af Amer: 62 mL/min — ABNORMAL LOW (ref >90)
GFR calc Af Amer: 60 mL/min — ABNORMAL LOW (ref >90)
GFR calc non Af Amer: 53 mL/min — ABNORMAL LOW (ref >90)
GFR calc non Af Amer: 50 mL/min — ABNORMAL LOW (ref >90)
GFR calc non Af Amer: 52 mL/min — ABNORMAL LOW (ref >90)
GFR calc non Af Amer: 53 mL/min — ABNORMAL LOW (ref >90)
Glucose, Bld: 183 mg/dL — ABNORMAL HIGH (ref 70–99)
Glucose, Bld: 312 mg/dL — ABNORMAL HIGH (ref 70–99)
Glucose, Bld: 243 mg/dL — ABNORMAL HIGH (ref 70–99)
Potassium: 4.4 mEq/L (ref 3.5–5.1)
Potassium: 4 mEq/L (ref 3.5–5.1)
Potassium: 4 mEq/L (ref 3.5–5.1)
Potassium: 3.8 mEq/L (ref 3.5–5.1)
Sodium: 137 mEq/L (ref 135–145)
Sodium: 135 mEq/L (ref 135–145)
Sodium: 134 mEq/L — ABNORMAL LOW (ref 135–145)
Sodium: 134 mEq/L — ABNORMAL LOW (ref 135–145)

## 2012-05-19 LAB — COMPREHENSIVE METABOLIC PANEL
AST: 180 U/L — ABNORMAL HIGH (ref 0–37)
CO2: 18 mEq/L — ABNORMAL LOW (ref 19–32)
Calcium: 9 mg/dL (ref 8.4–10.5)
Creatinine, Ser: 1.08 mg/dL (ref 0.50–1.10)
GFR calc Af Amer: 58 mL/min — ABNORMAL LOW (ref >90)
GFR calc non Af Amer: 50 mL/min — ABNORMAL LOW (ref >90)
Total Protein: 5.8 g/dL — ABNORMAL LOW (ref 6.0–8.3)

## 2012-05-19 LAB — GLUCOSE, CAPILLARY
Glucose-Capillary: 120 mg/dL — ABNORMAL HIGH (ref 70–99)
Glucose-Capillary: 124 mg/dL — ABNORMAL HIGH (ref 70–99)
Glucose-Capillary: 135 mg/dL — ABNORMAL HIGH (ref 70–99)
Glucose-Capillary: 137 mg/dL — ABNORMAL HIGH (ref 70–99)
Glucose-Capillary: 137 mg/dL — ABNORMAL HIGH (ref 70–99)
Glucose-Capillary: 157 mg/dL — ABNORMAL HIGH (ref 70–99)
Glucose-Capillary: 157 mg/dL — ABNORMAL HIGH (ref 70–99)
Glucose-Capillary: 176 mg/dL — ABNORMAL HIGH (ref 70–99)
Glucose-Capillary: 180 mg/dL — ABNORMAL HIGH (ref 70–99)
Glucose-Capillary: 222 mg/dL — ABNORMAL HIGH (ref 70–99)
Glucose-Capillary: 243 mg/dL — ABNORMAL HIGH (ref 70–99)
Glucose-Capillary: 256 mg/dL — ABNORMAL HIGH (ref 70–99)
Glucose-Capillary: 280 mg/dL — ABNORMAL HIGH (ref 70–99)

## 2012-05-19 LAB — PREPARE FRESH FROZEN PLASMA
Unit division: 0
Unit division: 0

## 2012-05-19 LAB — MAGNESIUM: Magnesium: 2.2 mg/dL (ref 1.5–2.5)

## 2012-05-19 LAB — CBC
MCH: 26 pg (ref 26.0–34.0)
MCHC: 31.9 g/dL (ref 30.0–36.0)
MCV: 81.4 fL (ref 78.0–100.0)
Platelets: 481 10*3/uL — ABNORMAL HIGH (ref 150–400)
RBC: 3.5 MIL/uL — ABNORMAL LOW (ref 3.87–5.11)
RDW: 14.1 % (ref 11.5–15.5)

## 2012-05-19 LAB — PROTIME-INR
INR: 4.73 — ABNORMAL HIGH (ref 0.00–1.49)
Prothrombin Time: 41.4 seconds — ABNORMAL HIGH (ref 11.6–15.2)

## 2012-05-19 LAB — POCT I-STAT, CHEM 8
BUN: 25 mg/dL — ABNORMAL HIGH (ref 6–23)
Calcium, Ion: 1.26 mmol/L (ref 1.13–1.30)
Chloride: 114 mEq/L — ABNORMAL HIGH (ref 96–112)
Glucose, Bld: 176 mg/dL — ABNORMAL HIGH (ref 70–99)

## 2012-05-19 LAB — PROCALCITONIN: Procalcitonin: 4.74 ng/mL

## 2012-05-19 MED ORDER — ISOPROTERENOL HCL 0.2 MG/ML IJ SOLN
2.0000 ug/min | INTRAVENOUS | Status: DC
  Administered 2012-05-19 (×2): 2 ug/min via INTRAVENOUS
  Filled 2012-05-19 (×3): qty 5

## 2012-05-19 MED ORDER — SODIUM CHLORIDE 0.9 % IV BOLUS (SEPSIS)
500.0000 mL | Freq: Once | INTRAVENOUS | Status: AC
  Administered 2012-05-19: 500 mL via INTRAVENOUS

## 2012-05-19 MED ORDER — SODIUM CHLORIDE 0.9 % IV SOLN
INTRAVENOUS | Status: DC
  Administered 2012-05-19: 2.2 [IU]/h via INTRAVENOUS
  Filled 2012-05-19: qty 1

## 2012-05-19 MED ORDER — VASOPRESSIN 20 UNIT/ML IJ SOLN
0.0300 [IU]/min | INTRAVENOUS | Status: DC
  Administered 2012-05-19: 0.03 [IU]/min via INTRAVENOUS
  Filled 2012-05-19: qty 2.5

## 2012-05-19 MED ORDER — INSULIN ASPART 100 UNIT/ML ~~LOC~~ SOLN
2.0000 [IU] | SUBCUTANEOUS | Status: DC
  Administered 2012-05-19: 6 [IU] via SUBCUTANEOUS

## 2012-05-19 MED ORDER — INSULIN GLARGINE 100 UNIT/ML ~~LOC~~ SOLN
15.0000 [IU] | SUBCUTANEOUS | Status: DC
  Administered 2012-05-19: 15 [IU] via SUBCUTANEOUS
  Filled 2012-05-19: qty 0.15

## 2012-05-19 MED ORDER — NOREPINEPHRINE BITARTRATE 1 MG/ML IJ SOLN
0.5000 ug/min | INTRAVENOUS | Status: DC
  Administered 2012-05-19: 20 ug/min via INTRAVENOUS
  Administered 2012-05-19: 10 ug/min via INTRAVENOUS
  Administered 2012-05-19: 20 ug/min via INTRAVENOUS
  Administered 2012-05-19: 15 ug/min via INTRAVENOUS
  Filled 2012-05-19 (×2): qty 16

## 2012-05-19 MED ORDER — PANTOPRAZOLE SODIUM 40 MG IV SOLR
40.0000 mg | INTRAVENOUS | Status: DC
  Administered 2012-05-19 – 2012-05-26 (×8): 40 mg via INTRAVENOUS
  Filled 2012-05-19 (×10): qty 40

## 2012-05-19 NOTE — Consult Note (Signed)
PULMONARY  / CRITICAL CARE MEDICINE  Name: [name] MRN: [id number] DOB: 04/22/1940    ADMISSION DATE:  05/18/2012 CONSULTATION DATE:  3/13  REFERRING MD :  Maryan Rued  CHIEF COMPLAINT:  Cardiac arrest   BRIEF PATIENT DESCRIPTION:  72 year old female w/ h/o ICM (EF 35%), AF, severe AS and MR. Recently started on Tikosyn. Was in usual state of health until 3/13 when she was riding in the car and became suddenly unresponsive. Her husband estimates 6 minutes driving to ER. On arrival found to be in VF arrest. Team estimates 45 minutes of ACLS to ROSC. PCCM asked to admit.   SIGNIFICANT EVENTS / STUDIES:  CT head 3/12>>> Echo 3/12>>>  LINES / TUBES: OETT 3/12>>> Right IJ CVL 3/12>>> Right rad aline 3/12>>>  CULTURES: Sputum 3/12>>>  ANTIBIOTICS: Unasyn 3/12>>>  SUBJECTIVE:  Unresponsive on vent   VITAL SIGNS: Temp:  [90.3 F (32.4 C)-98.7 F (37.1 C)] 91.9 F (33.3 C) (03/14 0900) Pulse Rate:  [26-95] 48 (03/14 0900) Resp:  [16-24] 16 (03/14 0900) BP: (70-142)/(35-83) 117/61 mmHg (03/14 0810) SpO2:  [56 %-100 %] 100 % (03/14 0900) Arterial Line BP: (98-176)/(53-89) 98/54 mmHg (03/14 0900) FiO2 (%):  [50 %-100 %] 50 % (03/14 0900) Weight:  [75.1 kg (165 lb 9.1 oz)-78.1 kg (172 lb 2.9 oz)] 78.1 kg (172 lb 2.9 oz) (03/14 0600) HEMODYNAMICS: CVP:  [7 mmHg-16 mmHg] 10 mmHg VENTILATOR SETTINGS: Vent Mode:  [-] PRVC FiO2 (%):  [50 %-100 %] 50 % Set Rate:  [18 bmp] 18 bmp Vt Set:  [480 mL] 480 mL PEEP:  [5 cmH20] 5 cmH20 Plateau Pressure:  [21 cmH20-23 cmH20] 22 cmH20 INTAKE / OUTPUT: Intake/Output     03/13 0701 - 03/14 0700 03/14 0701 - 03/15 0700   I.V. (mL/kg) 1163 (14.9) 198.2 (2.5)   IV Piggyback 300 550   Total Intake(mL/kg) 1463 (18.7) 748.2 (9.6)   Urine (mL/kg/hr) 1030    Total Output 1030     Net +433 +748.2          PHYSICAL EXAMINATION: General:  Critically ill appearing white female, unresponsive on the vent  Neuro:  + posturing. Unresponsive   HEENT:  Orally intubated. Right IJ CVl in good position  Cardiovascular:  rrr Lungs:  Diffuse rhonchi/rales.  Abdomen:  Soft, non-tender. + bowel sounds  Musculoskeletal:  Intact  Skin:  Intact   LABS:  Recent Labs Lab 05/18/12 1100 05/18/12 1242 05/18/12 1250 05/18/12 1309  05/18/12 2042  05/18/12 2256 05/19/12 0055 05/19/12 0421 05/19/12 0422 05/19/12 0752  HGB 9.0*  --   --   --   < >  --   < > 9.5* 9.9*  --  9.1*  --   WBC 9.9  --   --   --   --   --   --   --   --   --  19.9*  --   PLT 283  --   --   --   --   --   --   --   --   --  481*  --   NA 136 136  --   --   < >  --   < > 141 140  --  137 137  K 3.6 4.4  --   --   < >  --   < > 3.5 3.8  --  4.4 4.4  CL 105 102  --   --   < >  --   < >  112 114*  --  106 108  CO2 15* 16*  --   --   --   --   --   --   --   --  18* 19  GLUCOSE 316* 279*  --   --   < >  --   < > 204* 176*  --  141* 183*  BUN 17 19  --   --   < >  --   < > 22 25*  --  25* 25*  CREATININE 0.84 1.05  --   --   < >  --   < > 0.90 0.90  --  1.08 1.04  CALCIUM 8.7 10.1  --   --   --   --   --   --   --   --  9.0 8.5  MG  --  2.7*  --   --   --   --   --   --   --   --  2.2  --   AST 210*  --   --   --   --   --   --   --   --   --  180*  --   ALT 149*  --   --   --   --   --   --   --   --   --  150*  --   ALKPHOS 105  --   --   --   --   --   --   --   --   --  101  --   BILITOT 0.2*  --   --   --   --   --   --   --   --   --  0.3  --   PROT 6.0  --   --   --   --   --   --   --   --   --  5.8*  --   ALBUMIN 2.5*  --   --   --   --   --   --   --   --   --  2.7*  --   APTT 44* 41*  --   --   --  54*  --   --   --   --  81*  --   INR 5.05* 3.41*  --   --   --  4.70*  --   --   --   --  4.73*  --   LATICACIDVEN  --   --  7.87*  --   --   --   --   --   --  2.6*  --   --   TROPONINI <0.30  --   --   --   --   --   --   --   --   --  2.17*  --   PROCALCITON  --   --  <0.10  --   --   --   --   --   --   --  4.74  --   PHART  --   --   --  7.292*  --    --   --   --   --   --   --   --   PCO2ART  --   --   --  38.4  --   --   --   --   --   --   --   --  PO2ART  --   --   --  109.0*  --   --   --   --   --   --   --   --   < > = values in this interval not displayed.  Recent Labs Lab 05/19/12 0444 05/19/12 0546 05/19/12 0648 05/19/12 0753 05/19/12 0853  GLUCAP 124* 135* 137* 157* 144*   CXR: diffuse pulmonary infiltrates. OETT in good position. CVL in good position   ASSESSMENT / PLAN:  PULMONARY A: Acute respiratory failure s/p cardiac arrest Diffuse pulmonary infiltrates: not clear if this is d/t edema or aspiration. No report of vomiting.  P:   - Full vent support until hypothermia protocol is complete. - Sedation protocol. - See ID section.  CARDIOVASCULAR A:  VF arrest Afib  ICM w/ EF 35% Cardiogenic shock Severe AS and mitral valve disease.  Currently on low dose levophed.  P:  - IVFs to get CVP goal 8-12 - Levophed for MAP >85 - Will titrate levo as tolerated. - Ok to cardiovert intermittently while being cooled per discussion with cards and and family.  If continuous shocks are needed will not continue that. - Will change code status to no CPR.  RENAL A:   +anion gap metabolic acidosis P:   - Continue resuscitation efforts - F/u BMET - Replace electrolytes as needed.  GASTROINTESTINAL A:   GERD Elevated LFTs: likely shock liver.  P:   - PPI  HEMATOLOGIC A:   Chronic anemia Warfarin induced coagulopathy  P:  - FFP - Goal INR 2-2.5, now 4.7 but patient is being cooled, will not reverse further given artificial valve and the fact that patient is not bleeding.  INFECTIOUS A:  Possible aspiration  P:   - PCT algorithm - Sputum culture - Empiric unasyn  ENDOCRINE A:  DM P:   - ssi protocol   NEUROLOGIC A:  Acute encephalopathy Very high concern that this represents anoxic injury P:   - Hypothermia protocol.  Spoke with husband at length, ok with shock intermittently while being  cooled, will bring living will with him in AM and will discuss course of action after hypothermia protocol is complete pending neuro status.  I have personally obtained a history, examined the patient, evaluated laboratory and imaging results, formulated the assessment and plan and placed orders.  CRITICAL CARE: The patient is critically ill with multiple organ systems failure and requires high complexity decision making for assessment and support, frequent evaluation and titration of therapies, application of advanced monitoring technologies and extensive interpretation of multiple databases. Critical Care Time devoted to patient care services described in this note is 35 minutes.   Rush Farmer, M.D. Pulmonary and [place] Pager: [phone]  05/19/2012, 9:48 AM

## 2012-05-19 NOTE — Care Management Note (Addendum)
    Page 1 of 1   05/29/2012     3:58:22 PM   CARE MANAGEMENT NOTE 05/29/2012  Patient:  [name]   Account Number:  [id number]  Date Initiated:  05/19/2012  Documentation initiated by:  Elissa Hefty  Subjective/Objective Assessment:   adm w cardiac arrest     Action/Plan:   lives w husband, pcp dr Scarlette Calico, recent adm and went home on tikosyn   Anticipated DC Date:     Anticipated DC Plan:        [place]  CM consult      Choice offered to / List presented to:             Status of service:   Medicare Important Message given?   (If response is "NO", the following Medicare IM given date fields will be blank) Date Medicare IM given:   Date Additional Medicare IM given:    Discharge Disposition:    Per UR Regulation:  Reviewed for med. necessity/level of care/duration of stay  If discussed at [place] of Stay Meetings, dates discussed:   05/25/2012  05/30/2012    Comments:  05-29-12 1543 Jacqlyn Krauss, RN,BSN [phone]  Pt tx to tele and d/c heparin gtt on coumadin awaiting ICD to be placed before d/c. Plan for CIR at d/c if bed is available. CM will continue to monitor for disposition needs.  3/19 1115 debbie dowell rn,bsn extub on 3-18. pul edema post extub. will cont to follow for dc needs.  3/14 0849 debbie dowell rn,bsn

## 2012-05-19 NOTE — Progress Notes (Signed)
CRITICAL VALUE ALERT  Critical value received: INR 4.7  Date of notification:  05/18/12  Time of notification:  2050  Critical value read back:yes  Nurse who received alert:  Purcell Nails  MD notified (1st page):  MD Zubelivitsky  Time of first page:  2100  MD notified (2nd page):  Time of second page:  Responding MD:  MD Kieth Brightly  Time MD responded: 2100

## 2012-05-19 NOTE — Progress Notes (Signed)
CRITICAL VALUE ALERT  Critical value received:  Troponin 2.17  Date of notification: 05/19/12  Time of notification:  0510  Critical value read back:yes   Nurse who received alert:  Purcell Nails  MD notified (1st page): Loralie Champagne  Time of first page: [phone]  MD notified (2nd page):  Time of second page:  Responding MD: Loralie Champagne  Time MD responded:  [phone]

## 2012-05-19 NOTE — Consult Note (Addendum)
ELECTROPHYSIOLOGY CONSULT NOTE  Patient ID: [name] MRN: [id number], DOB/AGE: 1940-05-07   Admit date: 05/18/2012 Date of Consult: 05/19/2012  Primary Physician: Scarlette Calico, MD Primary Cardiologist: Stanford Breed, MD Reason for Consultation: VF arrest  History of Present Illness [name] is a 72 year old woman with h/o severe AS, mitral regurgitation, aneurysm of the ascending thoracic aorta (status post aortic valve replacement, mitral valve repair, resection and grafting of the ascending thoracic aortic aneurysm on Jul 07, 2006), CAD s/p CABG, AF s/p recent Tikosyn initiation and LV dysfunction who presented yesterday after out-of-hospital VF arrest. K 4.4. Mg 2.7.  [name] is intubated, sedated and on cooling per protocol. Her history is obtained from the chart. According to her H&P, her husband drove her to [facility] ED and there she required prolonged resuscitation, with multiple drugs and shocks, but pulses returned in about 45 minutes. She was transferred to [facility] ED where she was intubated and sedated. She was started on Nimbex. She is also on Levophed for pressure support. Her initial QT was not significantly prolonged however with cooler, her QT is markedly prolonged at this time.  Most recent cardiac w/u - echocardiogram was performed in October of 2013. Her ejection fraction was 35-40%. There was mild perivalvular aortic insufficiency. There was mild mitral stenosis and mild residual mitral regurgitation. There was mild to moderate tricuspid regurgitation. Mild biatrial enlargement. MUGA was performed in October of 2013 and showed an ejection fraction of 35%. Because patient's LV function was reduced a Myoview was performed. There was a fixed inferior defect consistent with thinning and a partially reversible anterior defect consistent with prior infarct and mild peri-infarct ischemia. Ejection fraction 33%, global hypokinesis and anteroseptal akinesis. Cardiac catheterization repeated in  January of 2014. The left main was occluded. The LAD, circumflex and right coronary artery were patent. The LIMA to the LAD was patent but atretic. Saphenous vein graft to the obtuse marginal was patent. This graft filled the obtuse marginal and ramus intermedius and the entire LAD filled retrograde from this graft.  Past Medical History Past Medical History  Diagnosis Date  . Anemia   . Atrial fibrillation     A.fib/flutter: s/p MAZE 2008, DCCV 2011, on coumadin; previously on flecainide, but dc'd due to worsening EF  . Diverticulitis   . Valvular heart disease     bicuspid AV/severe AS, mitral regurgitation, & aneurysm of the ascending thoracic aorta (s/p AV replacement, MV repair, resection and grafting of the ascending thoracic aortic aneurysm 2008  . CVA (cerebral vascular accident)     2008 - felt due to oscillating calcium on aortic valve  . Carcinoma in situ of vulva   . Fibroid   . Ovarian cyst, left 2008-2009  . Osteoporosis   . Diabetes mellitus   . Hypertension   . GERD (gastroesophageal reflux disease)   . Chronic systolic CHF (congestive heart failure)     EF 35%  . Ischemic cardiomyopathy     EF 35%  . Coronary artery disease     Occluded LM 2/2 previous aortic root surgery; s/p 2v CABG 2010 LIMA to LAD, SVG to OM; Cath 03/11/12 patent SVG to OM, atretic LIMA to LAD, patent RCA, occluded native LM, EF 35%   . Shortness of breath   . Arthritis     OSTEO  . Fracture of lumbar spine     Past Surgical History Past Surgical History  Procedure Laterality Date  . Foot surgery  removed neuroma  . Aortic root replacement  2008    homograft  . Mv repair  2008    due to severe MR  . Resection and grafting of ascending thoracic aortic    . Aneurysm and left sided maze  2008  . Coronary artery bypass graft  4/10    LIMA to LAD, SVG to OM  . Wide excision of vulva      CA INSITU     Allergies/Intolerances No Known Allergies  Inpatient Medications . sodium  chloride  2,000 mL Intravenous Once  . ampicillin-sulbactam (UNASYN) IV  1.5 g Intravenous Q6H  . artificial tears  1 application Both Eyes [id number]  . chlorhexidine  15 mL Mouth/Throat BID  . fentaNYL  100 mcg Intravenous Once  . fentaNYL  100 mcg Intravenous Once  . insulin aspart  2-6 Units Subcutaneous Q4H  . insulin glargine  15 Units Subcutaneous Q24H  . midazolam  2 mg Intravenous Once  . potassium chloride       . sodium chloride 20 mL/hr at 05/18/12 1711  . amiodarone (NEXTERONE PREMIX) 360 mg/200 mL dextrose 30 mg/hr (05/19/12 0556)  . cisatracurium (NIMBEX) infusion 1 mcg/kg/min (05/18/12 1248)  . cisatracurium (NIMBEX) infusion 1.5 mcg/kg/min (05/18/12 1707)  . fentaNYL infusion INTRAVENOUS 50 mcg/hr (05/18/12 2147)  . fentaNYL infusion INTRAVENOUS 50 mcg/hr (05/18/12 1708)  . midazolam (VERSED) infusion 3 mg/hr (05/18/12 2008)  . midazolam (VERSED) infusion    . norepinephrine (LEVOPHED) Adult infusion    . norepinephrine (LEVOPHED) Adult infusion      Family History (per chart) Family History  Problem Relation Age of Onset  . Hypertension Mother   . Breast cancer Maternal Aunt     age 2's  . Cancer Paternal Grandmother     COLON     Social History (per chart) History   Social History  . Marital Status: Married    Spouse Name: N/A    Number of Children: N/A  . Years of Education: N/A   Occupational History  . Retired Radiation protection practitioner    Social History Main Topics  . Smoking status: Former Smoker    Quit date: 03/09/1983  . Smokeless tobacco: Never Used  . Alcohol Use: No  . Drug Use: No  . Sexually Active: Not Currently    Birth Control/ Protection: Post-menopausal   Other Topics Concern  . Not on file   Social History Narrative    Her mother died at age 69 from a stroke.  Her father died at age 11, had chronic obstructive pulmonary disease and colon disease.   No regular exercise     Review of Systems Unable to obtain from patient  Physical  Exam Blood pressure 105/54, pulse 46, temperature 91.2 F (32.9 C), temperature source Core (Comment), resp. rate 18, height 5\' 6"  (1.676 m), weight 172 lb 2.9 oz (78.1 kg), SpO2 100.00%.  General: Well developed, ill appearing 72 year old female iwho is intubated and sedated. HEENT: Normocephalic, atraumatic. ET tube in place. Sclera nonicteric.  Neck: Supple. No JVD. Lungs: Mechanical ventilation. Respirations regular, CTA bilaterally. No wheezes, rales or rhonchi. Heart: Bradycardic. S1, S2 present. No murmurs, rub, S3 or S4. Abdomen: Soft, non-distended. BS present x 4 quadrants. No hepatosplenomegaly.  Extremities: No clubbing, cyanosis or LE edema. 1+ LUE edema. DP/PT/radials diminished but equal bilaterally. Neuro: Deferred. Patient intubated and sedated. Skin: Intact. Warm and dry. No rashes or petechiae in exposed areas.   Labs  Recent Labs  05/18/12 1100 05/19/12  0422  TROPONINI <0.30 2.17*   Lab Results  Component Value Date   WBC 19.9* 05/19/2012   HGB 9.1* 05/19/2012   HCT 28.5* 05/19/2012   MCV 81.4 05/19/2012   PLT 481* 05/19/2012    Recent Labs Lab 05/19/12 0422  NA 137  K 4.4  CL 106  CO2 18*  BUN 25*  CREATININE 1.08  CALCIUM 9.0  PROT 5.8*  BILITOT 0.3  ALKPHOS 101  ALT 150*  AST 180*  GLUCOSE 141*   Lab Results  Component Value Date   CHOL 200 04/08/2011   HDL 62.70 04/08/2011   LDLCALC 115* 04/08/2011   TRIG 110.0 04/08/2011    Recent Labs  05/19/12 0422  INR 4.73*    Radiology/Studies Ct Head Wo Contrast  05/18/2012  *RADIOLOGY REPORT*  Clinical Data: Unresponsive.  Prolonged resuscitation.  Evaluate for hemorrhage.  CT HEAD WITHOUT CONTRAST  Technique:  Contiguous axial images were obtained from the base of the skull through the vertex without contrast.  Comparison: MR brain 06/24/2006 and CT head 06/24/2006.  Findings: No evidence of acute infarct, acute hemorrhage, mass lesion, mass effect or hydrocephalus.  Atrophy.  There is mild  periventricular low attenuation.  Tiny remote left basal ganglia lacunar infarct.  Scattered mucosal disease in the paranasal sinuses.  IMPRESSION:  1.  No acute intracranial abnormality. 2.  Atrophy and mild chronic microvascular white matter ischemic changes.   Original Report Authenticated By: Lorin Picket, M.D.    Dg Chest Port 1 View  05/18/2012  *RADIOLOGY REPORT*  Clinical Data:  Intubation, arrest.  PORTABLE CHEST - 1 VIEW  Comparison: 02/21/2012  Findings: Endotracheal tube is 4-5 cm above the carina.  Right central line tip in the SVC.  No pneumothorax.  Diffuse bilateral airspace disease with cardiomegaly.  No effusions.  No acute bony abnormality.  IMPRESSION: Support devices in expected position as above. No pneumothorax.  Diffuse bilateral airspace disease.   Original Report Authenticated By: Rolm Baptise, M.D.     12-lead ECG on admission shows WCT at 125 bpm; ECG this AM shows sinus bradycardia at 47 bpm with no ischemic ST changes but with prolonged QT  Telemetry - bradycardic in the 40s; with salvos of polymorphic VT overnight and this AM   Assessment and Plan 1. VF arrest - now intubated, sedated and on cooling per PCCM 2. Prolonged QT, Tikosyn d/c'd 3. PAF 4. LV dysfunction 5. CAD 6. Valvular heart disease s/p bioprosthetic AVR and MV repair 2008  Agree with d/c'ing Tikosyn. Agree with repeating echo. Troponin elevated and will watch trend. Recent cath Jan 2014, with results as outlined above. Continue hemodynamic support per PCCM.  Dr. Rayann Heman to see Signed, Ileene Hutchinson, PA-C 05/19/2012, 7:07 AM  I have seen, examined the patient, and reviewed the above assessment and plan.  Changes to above are made where necessary.  The patient has had a VF arrest in the setting of an ischemic CM with EF 33%.  She was recently placed on tikosyn for afib.  Though her admitting electrolytes and QT were stable, I cannot be sure that Phyllis Ginger is not partly responsible.  As Tikosyn has  washed out however, with cooling her QT has prolonged and she is having incessant NSVT with salvos of PMVT.   During her recent hospitalization, ICD was offered but the patient deferred.    At this point, I think that we should be hopefully and aggressive with her medical care.  She will likely require ICD prior to  discharge if she makes a meaningful recovery. In the interim, keep K>3.9 and Mg >1.9.  Stop tikosyn and avoid QT prolonging drugs. Continue IV amiodarone for now however would be reluctant to increase this further as amiodarone can contribute to her QT prolongation.  IF she has sustained and refractory arrhythmias then overdrive pacing may be required.  Co Sign: Thompson Grayer, MD 05/19/2012 8:16 AM  Addendum While in the patients room, she developed torsades and degenerated into ventricular fibrillation.  After receiving OK from spouse to deliver defibrillation, a single 200J biphasic shock was delivered with cardioversion electrodes in the A/P configuration.  Sinus rhythm was restored.  She is now in sinus bradycardia (HR 40s) with prolonged QT.  Per my discussion with her spouse, we will continue to deliver defibrillatory shocks if required for sustained ventricular arrhythmias, though if she develops refractory arrhythmias then we at that point would likely make comfort care. We will remain optimistic and press on for now.  I will add isuprel 44mcg/min and titrate for HR of 80 to minimize long short coupling.  I would not recommend increasing amiodarone. Call me with questions or problems.  Jeneen Rinks Allred,MD

## 2012-05-19 NOTE — Progress Notes (Signed)
Chaplain visited and escort husband of pt in Trauma A at the request of nurse. Chaplain provided ministry of presence, empathically listened to family member, shared words of encouragement, comfort and hope with husband. Patient was later admitted to 2901. Chaplain will follow-up as needed. Pt's husband expressed appreciation for chaplain's presence and support. Jarrett Ables [phone]

## 2012-05-19 NOTE — Progress Notes (Signed)
INITIAL NUTRITION ASSESSMENT  DOCUMENTATION CODES Per approved criteria  -Not Applicable   INTERVENTION: - Once patient re-warmed and if unable to extubate, recommend initiate Osmolite 1.2 at 20 ml/hr. Advance by 10 ml/hr every 4 hours to goal rate of 50 ml/hr with 30 ml Prostat BID, to provide 1640 kcal, 97 g protein, 984 ml free water.  - RD to reassess caloric needs once patient is re-warmed.   NUTRITION DIAGNOSIS: Inadequate oral intake related to inability to eat as evidenced by NPO status.  Goal: Patient will meet >/=90% of estimated nutrition needs.  Monitor:  Respiratory status, TF initiation/diet advancement, weight, labs, I/Os  Reason for Assessment: Vent  72 y.o. female  Admitting Dx: Sudden cardiac arrest  ASSESSMENT: Patient admitted with cardiac arrest with acute respiratory failure. She is intubated and sedated. She is currently on hypothermia protocol. Unable to accurately assess caloric needs.   Height: Ht Readings from Last 1 Encounters:  05/18/12 5\' 6"  (1.676 m)    Weight: Wt Readings from Last 1 Encounters:  05/19/12 172 lb 2.9 oz (78.1 kg)    Ideal Body Weight: 59.1 kg  % Ideal Body Weight: 132%  Wt Readings from Last 10 Encounters:  05/19/12 172 lb 2.9 oz (78.1 kg)  04/21/12 165 lb 8 oz (75.07 kg)  04/05/12 166 lb (75.297 kg)  04/03/12 165 lb (74.844 kg)  04/03/12 165 lb (74.844 kg)  03/31/12 168 lb 12.8 oz (76.567 kg)  03/21/12 172 lb (78.019 kg)  03/21/12 172 lb (78.019 kg)  03/17/12 172 lb 12.8 oz (78.382 kg)  03/14/12 173 lb (78.472 kg)    Usual Body Weight: 77 kg  % Usual Body Weight: 101%  BMI:  Body mass index is 27.8 kg/(m^2). Patient is overweight.   Estimated Nutritional Needs: Kcal: 1606  kcal Protein: 95-110 g Fluid: 2.2-2.4 L  Skin: Intact  Diet Order:  NPO  EDUCATION NEEDS: -No education needs identified at this time   Intake/Output Summary (Last 24 hours) at 05/19/12 1515 Last data filed at 05/19/12 1500  Gross per 24 hour  Intake 2856.33 ml  Output   1170 ml  Net 1686.33 ml    Last BM: PTA   Labs:   Recent Labs Lab 05/18/12 1242  05/19/12 0422 05/19/12 0752 05/19/12 1157  NA 136  < > 137 137 135  K 4.4  < > 4.4 4.4 4.1  CL 102  < > 106 108 104  CO2 16*  --  18* 19 16*  BUN 19  < > 25* 25* 27*  CREATININE 1.05  < > 1.08 1.04 1.08  CALCIUM 10.1  --  9.0 8.5 8.6  MG 2.7*  --  2.2  --   --   GLUCOSE 279*  < > 141* 183* 286*  < > = values in this interval not displayed.  CBG (last 3)   Recent Labs  05/19/12 0753 05/19/12 0853 05/19/12 1158  GLUCAP 157* 144* 243*    Scheduled Meds: . ampicillin-sulbactam (UNASYN) IV  1.5 g Intravenous Q6H  . artificial tears  1 application Both Eyes [id number]  . chlorhexidine  15 mL Mouth/Throat BID  . insulin aspart  2-6 Units Subcutaneous Q4H  . insulin glargine  15 Units Subcutaneous Q24H  . pantoprazole (PROTONIX) IV  40 mg Intravenous Q24H    Continuous Infusions: . sodium chloride 20 mL/hr at 05/18/12 1711  . amiodarone (NEXTERONE PREMIX) 360 mg/200 mL dextrose 30 mg/hr (05/19/12 0944)  . cisatracurium (NIMBEX) infusion 1.5 mcg/kg/min (05/18/12  1707)  . fentaNYL infusion INTRAVENOUS 50 mcg/hr (05/18/12 1708)  . isoproterenol (ISUPREL) infusion 2 mcg/min (05/19/12 0933)  . midazolam (VERSED) infusion    . norepinephrine (LEVOPHED) Adult infusion 10 mcg/min (05/19/12 1020)  . vasopressin (PITRESSIN) infusion - *FOR SHOCK* 0.03 Units/min (05/19/12 0944)    Past Medical History  Diagnosis Date  . Anemia   . Atrial fibrillation     A.fib/flutter: s/p MAZE 2008, DCCV 2011, on coumadin; previously on flecainide, but dc'd due to worsening EF  . Diverticulitis   . Valvular heart disease     bicuspid AV/severe AS, mitral regurgitation, & aneurysm of the ascending thoracic aorta (s/p AV replacement, MV repair, resection and grafting of the ascending thoracic aortic aneurysm 2008  . CVA (cerebral vascular accident)     2008 - felt due  to oscillating calcium on aortic valve  . Carcinoma in situ of vulva   . Fibroid   . Ovarian cyst, left 2008-2009  . Osteoporosis   . Diabetes mellitus   . Hypertension   . GERD (gastroesophageal reflux disease)   . Chronic systolic CHF (congestive heart failure)     EF 35%  . Ischemic cardiomyopathy     EF 35%  . Coronary artery disease     Occluded LM 2/2 previous aortic root surgery; s/p 2v CABG 2010 LIMA to LAD, SVG to OM; Cath 03/11/12 patent SVG to OM, atretic LIMA to LAD, patent RCA, occluded native LM, EF 35%   . Shortness of breath   . Arthritis     OSTEO  . Fracture of lumbar spine     Past Surgical History  Procedure Laterality Date  . Foot surgery      removed neuroma  . Aortic root replacement  2008    homograft  . Mv repair  2008    due to severe MR  . Resection and grafting of ascending thoracic aortic    . Aneurysm and left sided maze  2008  . Coronary artery bypass graft  4/10    LIMA to LAD, SVG to OM  . Wide excision of vulva      [facility] INSITU    Larey Seat, RD, LDN Pager #: [phone] After-Hours Pager #: [phone]

## 2012-05-19 NOTE — Progress Notes (Signed)
Elink Md informed that patient's BP map cannot be maintained  greater than 85 on current levophed dose limits and a decline in patient's urine output.

## 2012-05-20 ENCOUNTER — Inpatient Hospital Stay (HOSPITAL_COMMUNITY): Payer: Medicare Other

## 2012-05-20 LAB — BASIC METABOLIC PANEL
BUN: 30 mg/dL — ABNORMAL HIGH (ref 6–23)
CO2: 18 mEq/L — ABNORMAL LOW (ref 19–32)
CO2: 18 mEq/L — ABNORMAL LOW (ref 19–32)
CO2: 19 mEq/L (ref 19–32)
Calcium: 8.5 mg/dL (ref 8.4–10.5)
Calcium: 8.7 mg/dL (ref 8.4–10.5)
Calcium: 8.7 mg/dL (ref 8.4–10.5)
Creatinine, Ser: 1.1 mg/dL (ref 0.50–1.10)
Creatinine, Ser: 1.11 mg/dL — ABNORMAL HIGH (ref 0.50–1.10)
GFR calc Af Amer: 57 mL/min — ABNORMAL LOW (ref >90)
GFR calc non Af Amer: 49 mL/min — ABNORMAL LOW (ref >90)
GFR calc non Af Amer: 46 mL/min — ABNORMAL LOW (ref >90)
Glucose, Bld: 123 mg/dL — ABNORMAL HIGH (ref 70–99)
Sodium: 130 mEq/L — ABNORMAL LOW (ref 135–145)
Sodium: 133 mEq/L — ABNORMAL LOW (ref 135–145)
Sodium: 134 mEq/L — ABNORMAL LOW (ref 135–145)

## 2012-05-20 LAB — HEPATIC FUNCTION PANEL
ALT: 126 U/L — ABNORMAL HIGH (ref 0–35)
Alkaline Phosphatase: 113 U/L (ref 39–117)
Total Protein: 5.9 g/dL — ABNORMAL LOW (ref 6.0–8.3)

## 2012-05-20 LAB — GLUCOSE, CAPILLARY
Glucose-Capillary: 94 mg/dL (ref 70–99)
Glucose-Capillary: 150 mg/dL — ABNORMAL HIGH (ref 70–99)
Glucose-Capillary: 90 mg/dL (ref 70–99)
Glucose-Capillary: 99 mg/dL (ref 70–99)

## 2012-05-20 LAB — PROCALCITONIN: Procalcitonin: 3.59 ng/mL

## 2012-05-20 LAB — BLOOD GAS, ARTERIAL
Acid-base deficit: 6.2 mmol/L — ABNORMAL HIGH (ref 0.0–2.0)
Drawn by: 347621
FIO2: 0.4 %
MECHVT: 480 mL
Patient temperature: 96.8
RATE: 18 resp/min
pCO2 arterial: 33.3 mmHg — ABNORMAL LOW (ref 35.0–45.0)
pH, Arterial: 7.357 (ref 7.350–7.450)

## 2012-05-20 LAB — PROTIME-INR: Prothrombin Time: 55.9 seconds — ABNORMAL HIGH (ref 11.6–15.2)

## 2012-05-20 LAB — CBC
HCT: 28.9 % — ABNORMAL LOW (ref 36.0–46.0)
MCHC: 31.8 g/dL (ref 30.0–36.0)
MCV: 81.6 fL (ref 78.0–100.0)
RDW: 14.8 % (ref 11.5–15.5)

## 2012-05-20 LAB — TROPONIN I
Troponin I: 0.85 ng/mL (ref <0.30)
Troponin I: 0.69 ng/mL (ref <0.30)
Troponin I: 0.65 ng/mL (ref <0.30)

## 2012-05-20 MED ORDER — PHENYLEPHRINE HCL 10 MG/ML IJ SOLN
30.0000 ug/min | INTRAMUSCULAR | Status: DC
  Administered 2012-05-20: 50 ug/min via INTRAVENOUS
  Filled 2012-05-20: qty 1

## 2012-05-20 MED ORDER — MAGNESIUM SULFATE 40 MG/ML IJ SOLN
2.0000 g | Freq: Once | INTRAMUSCULAR | Status: AC
  Administered 2012-05-20: 2 g via INTRAVENOUS
  Filled 2012-05-20: qty 50

## 2012-05-20 MED ORDER — "THROMBI-PAD 3""X3"" EX PADS"
1.0000 | MEDICATED_PAD | Freq: Once | CUTANEOUS | Status: DC
  Filled 2012-05-20: qty 1

## 2012-05-20 MED ORDER — BIOTENE DRY MOUTH MT LIQD
15.0000 mL | OROMUCOSAL | Status: DC
  Administered 2012-05-20 – 2012-05-26 (×37): 15 mL via OROMUCOSAL

## 2012-05-20 MED ORDER — DEXTROSE 5 % IV SOLN
30.0000 mmol | Freq: Once | INTRAVENOUS | Status: DC
  Filled 2012-05-20: qty 10

## 2012-05-20 MED ORDER — VITAMIN K1 10 MG/ML IJ SOLN
1.0000 mg | Freq: Once | INTRAVENOUS | Status: AC
  Administered 2012-05-20: 1 mg via INTRAVENOUS
  Filled 2012-05-20: qty 0.1

## 2012-05-20 MED ORDER — SODIUM GLYCEROPHOSPHATE 1 MMOLE/ML IV SOLN
20.0000 mmol | Freq: Once | INTRAVENOUS | Status: AC
  Administered 2012-05-20: 20 mmol via INTRAVENOUS
  Filled 2012-05-20 (×2): qty 20

## 2012-05-20 MED ORDER — INSULIN ASPART 100 UNIT/ML ~~LOC~~ SOLN
2.0000 [IU] | SUBCUTANEOUS | Status: DC
  Administered 2012-05-20: 4 [IU] via SUBCUTANEOUS
  Administered 2012-05-20: 2 [IU] via SUBCUTANEOUS
  Administered 2012-05-22: 4 [IU] via SUBCUTANEOUS
  Administered 2012-05-22: 2 [IU] via SUBCUTANEOUS
  Administered 2012-05-23 (×3): 4 [IU] via SUBCUTANEOUS
  Administered 2012-05-23: 13:00:00 via SUBCUTANEOUS
  Administered 2012-05-24 (×2): 2 [IU] via SUBCUTANEOUS
  Administered 2012-05-24 (×2): 4 [IU] via SUBCUTANEOUS
  Administered 2012-05-24: 20:00:00 via SUBCUTANEOUS
  Administered 2012-05-24: 2 [IU] via SUBCUTANEOUS
  Administered 2012-05-25: 6 [IU] via SUBCUTANEOUS
  Administered 2012-05-25 (×2): 4 [IU] via SUBCUTANEOUS
  Administered 2012-05-25: 2 [IU] via SUBCUTANEOUS
  Administered 2012-05-25: 4 [IU] via SUBCUTANEOUS
  Administered 2012-05-25 – 2012-05-26 (×4): 2 [IU] via SUBCUTANEOUS
  Administered 2012-05-26: 4 [IU] via SUBCUTANEOUS

## 2012-05-20 MED ORDER — INSULIN GLARGINE 100 UNIT/ML ~~LOC~~ SOLN
10.0000 [IU] | SUBCUTANEOUS | Status: DC
  Administered 2012-05-20 – 2012-06-01 (×12): 10 [IU] via SUBCUTANEOUS
  Filled 2012-05-20 (×11): qty 0.1

## 2012-05-20 MED ORDER — PHENYLEPHRINE HCL 10 MG/ML IJ SOLN
30.0000 ug/min | INTRAVENOUS | Status: DC
  Administered 2012-05-20: 100 ug/min via INTRAVENOUS
  Administered 2012-05-20: 75 ug/min via INTRAVENOUS
  Administered 2012-05-22: 20 ug/min via INTRAVENOUS
  Administered 2012-05-22: 70 ug/min via INTRAVENOUS
  Administered 2012-05-23: 30 ug/min via INTRAVENOUS
  Filled 2012-05-20 (×5): qty 4

## 2012-05-20 MED ORDER — SODIUM GLYCEROPHOSPHATE 1 MMOLE/ML IV SOLN
10.0000 mmol | Freq: Once | INTRAVENOUS | Status: AC
  Administered 2012-05-20: 10 mmol via INTRAVENOUS
  Filled 2012-05-20: qty 10

## 2012-05-20 NOTE — Progress Notes (Signed)
PULMONARY  / CRITICAL CARE MEDICINE  Name: [name] MRN: [id number] DOB: [birth year]    ADMISSION DATE:  05/18/2012 CONSULTATION DATE:  3/13  REFERRING MD :  Maryan Rued  CHIEF COMPLAINT:  Cardiac arrest   BRIEF PATIENT DESCRIPTION:  72 year old female w/ h/o ICM (EF 35%), AF, severe AS and MR. Recently started on Tikosyn. Was in usual state of health until 3/13 when she was riding in the car and became suddenly unresponsive. Her husband estimates 6 minutes driving to ER. On arrival found to be in VF arrest. Team estimates 45 minutes of ACLS to ROSC. PCCM asked to admit.   SIGNIFICANT EVENTS / STUDIES:  CT head 3/12>>> Echo 3/12>>>  LINES / TUBES: OETT 3/12>>> Right IJ CVL 3/12>>> Right rad aline 3/12>>>  CULTURES: Sputum 3/12>>>  ANTIBIOTICS: Unasyn 3/12>>>  SUBJECTIVE: Arousable, following command, N/V with elevated lipase overnight.  VITAL SIGNS: Temp:  [90.7 F (32.6 C)-98.6 F (37 C)] 97.4 F (36.3 C) (03/15 0735) Pulse Rate:  [54-153] 74 (03/15 0945) Resp:  [5-28] 12 (03/15 0945) BP: (104-160)/(45-76) 135/57 mmHg (03/15 0735) SpO2:  [89 %-100 %] 100 % (03/15 0945) Arterial Line BP: (91-163)/(53-86) 102/56 mmHg (03/15 0945) FiO2 (%):  [30 %-50 %] 30 % (03/15 0735) Weight:  [82.7 kg (182 lb 5.1 oz)] 82.7 kg (182 lb 5.1 oz) (03/15 0600) HEMODYNAMICS: CVP:  [10 mmHg-15 mmHg] 15 mmHg VENTILATOR SETTINGS: Vent Mode:  [-] PRVC FiO2 (%):  [30 %-50 %] 30 % Set Rate:  [18 bmp] 18 bmp Vt Set:  [480 mL] 480 mL PEEP:  [5 cmH20] 5 cmH20 Plateau Pressure:  [20 cmH20-22 cmH20] 21 cmH20 INTAKE / OUTPUT: Intake/Output     03/14 0701 - 03/15 0700 03/15 0701 - 03/16 0700   I.V. (mL/kg) 2574.4 (31.1) 193.8 (2.3)   IV Piggyback 700 50   Total Intake(mL/kg) 3274.4 (39.6) 243.8 (2.9)   Urine (mL/kg/hr) 468 (0.2) 40 (0.2)   Total Output 468 40   Net +2806.4 +203.8        Emesis Occurrence 1 x      PHYSICAL EXAMINATION: General:  Critically ill appearing white female,  responsive and following command, nodding appropriately. Neuro: Responsive, following command and moving ext to command. HEENT:  Orally intubated. Right IJ CVl in good position  Cardiovascular: RRR Lungs:  Diffuse rhonchi/rales.  Abdomen:  Soft, non-tender. + bowel sounds  Musculoskeletal:  Intact  Skin:  Intact   LABS:  Recent Labs Lab 05/18/12 1100 05/18/12 1242 05/18/12 1250 05/18/12 1309  05/18/12 2042  05/19/12 0055 05/19/12 0421 05/19/12 0422  05/19/12 2000 05/19/12 2357 05/20/12 0315 05/20/12 0317 05/20/12 0405  HGB 9.0*  --   --   --   < >  --   < > 9.9*  --  9.1*  --   --   --  9.2*  --   --   WBC 9.9  --   --   --   --   --   --   --   --  19.9*  --   --   --  27.6*  --   --   PLT 283  --   --   --   --   --   --   --   --  481*  --   --   --  498*  --   --   NA 136 136  --   --   < >  --   < >  140  --  137  < > 133* 134* 133*  --   --   K 3.6 4.4  --   --   < >  --   < > 3.8  --  4.4  < > 3.8 4.1 4.7  --   --   CL 105 102  --   --   < >  --   < > 114*  --  106  < > 103 104 102  --   --   CO2 15* 16*  --   --   --   --   --   --   --  18*  < > 17* 18* 19  --   --   GLUCOSE 316* 279*  --   --   < >  --   < > 176*  --  141*  < > 243* 118* 123*  --   --   BUN 17 19  --   --   < >  --   < > 25*  --  25*  < > 29* 29* 30*  --   --   CREATININE 0.84 1.05  --   --   < >  --   < > 0.90  --  1.08  < > 1.05 1.10 1.11*  --   --   CALCIUM 8.7 10.1  --   --   --   --   --   --   --  9.0  < > 8.5 8.7 8.7  --   --   MG  --  2.7*  --   --   --   --   --   --   --  2.2  --   --   --  1.8  --   --   PHOS  --   --   --   --   --   --   --   --   --   --   --   --   --  2.1*  --   --   AST 210*  --   --   --   --   --   --   --   --  180*  --   --   --  82*  --   --   ALT 149*  --   --   --   --   --   --   --   --  150*  --   --   --  126*  --   --   ALKPHOS 105  --   --   --   --   --   --   --   --  101  --   --   --  113  --   --   BILITOT 0.2*  --   --   --   --   --   --   --    --  0.3  --   --   --  0.2*  --   --   PROT 6.0  --   --   --   --   --   --   --   --  5.8*  --   --   --  5.9*  --   --   ALBUMIN 2.5*  --   --   --   --   --   --   --   --  2.7*  --   --   --  2.7*  --   --   APTT 44* 41*  --   --   --  54*  --   --   --  81*  --   --   --   --   --   --   INR 5.05* 3.41*  --   --   --  4.70*  --   --   --  4.73*  --   --   --  7.09*  --   --   LATICACIDVEN  --   --  7.87*  --   --   --   --   --  2.6*  --   --   --   --   --   --   --   TROPONINI <0.30  --   --   --   --   --   --   --   --  2.17*  --   --   --   --  0.85*  --   PROCALCITON  --   --  <0.10  --   --   --   --   --   --  4.74  --   --   --  3.59  --   --   PHART  --   --   --  7.292*  --   --   --   --   --   --   --   --   --   --   --  7.357  PCO2ART  --   --   --  38.4  --   --   --   --   --   --   --   --   --   --   --  33.3*  PO2ART  --   --   --  109.0*  --   --   --   --   --   --   --   --   --   --   --  132.0*  < > = values in this interval not displayed.  Recent Labs Lab 05/20/12 0141 05/20/12 0241 05/20/12 0325 05/20/12 0446 05/20/12 0752  GLUCAP 95 94 110* 113* 150*   CXR: diffuse pulmonary infiltrates. OETT in good position. CVL in good position   ASSESSMENT / PLAN:  PULMONARY A: Acute respiratory failure s/p cardiac arrest Diffuse pulmonary infiltrates: not clear if this is d/t edema or aspiration. No report of vomiting.  P:   - Begin PS trials but no extubation until more hemodynamically stable. - Sedation protocol. - See ID section.  CARDIOVASCULAR A:  VF arrest Afib  ICM w/ EF 35% Cardiogenic shock Severe AS and mitral valve disease.  Currently on low dose levophed.  P:  - KVO IVF. - Levophed for MAP >85 - Will titrate pressors as tolerated. - Ok to cardiovert intermittently while being cooled per discussion with cards and and family.  If continuous shocks are needed will not continue that.  RENAL A:   +anion gap metabolic acidosis P:   -  Continue resuscitation efforts - F/u BMET - Replace electrolytes as needed. - Replace Mg and Phos.  GASTROINTESTINAL A:   GERD Elevated LFTs: likely shock liver.  Lipase elevated P:   - PPI - NPO - F/U lipase and LFT in AM.  HEMATOLOGIC A:   Chronic  anemia Warfarin induced coagulopathy  P:  - Goal INR 2-2.5, now 4.7 but patient is being cooled, will not reverse further given artificial valve and the fact that patient is not bleeding.  INFECTIOUS A:  Possible aspiration  P:   - PCT algorithm. - Sputum culture. - Empiric unasyn.  ENDOCRINE A:  DM P:   - SSI protocol   NEUROLOGIC A:  Acute encephalopathy Essentially resolved, following commands. P:   - Hypothermia protocol complete. - Minimize sedation as tolerated.  I have personally obtained a history, examined the patient, evaluated laboratory and imaging results, formulated the assessment and plan and placed orders.  CRITICAL CARE: The patient is critically ill with multiple organ systems failure and requires high complexity decision making for assessment and support, frequent evaluation and titration of therapies, application of advanced monitoring technologies and extensive interpretation of multiple databases. Critical Care Time devoted to patient care services described in this note is 35 minutes.   Rush Farmer, M.D. Pulmonary and [place] Pager: [phone]  05/20/2012, 9:52 AM

## 2012-05-20 NOTE — Progress Notes (Signed)
INR 7   Vitamin K ordered;

## 2012-05-20 NOTE — Progress Notes (Signed)
1. Vomiting while having a bath, clear secretions, no evidence of bleeding   obtain lipase and lfts  Maintain head of the bed elevated; monitor for aspiration  2. Oozing at the central line, INR elevated  Thrombin pad ordered

## 2012-05-20 NOTE — Progress Notes (Signed)
EKG reviewed; ST changes noticed in V1 and V2; HR 140  discussed with cardiology, Dr. Colon Flattery; considered sec to tachycardia.   Now HR in the 90s repeat EKG pending.

## 2012-05-20 NOTE — Progress Notes (Signed)
Tachycardia   EKG, cardiac enzymes; transition from Levophed to Neo synephrine

## 2012-05-20 NOTE — Progress Notes (Signed)
PROGRESS NOTE  Subjective:   [name] is a 72 year old woman with h/o severe AS, mitral regurgitation, aneurysm of the ascending thoracic aorta (status post aortic valve replacement, mitral valve repair, resection and grafting of the ascending thoracic aortic aneurysm on Jul 07, 2006), CAD s/p CABG, AF s/p recent Tikosyn initiation and LV dysfunction who presented yesterday after out-of-hospital VF arrest. K 4.4. Mg 2.7. Ms. Zorger is intubated, sedated and on cooling per protocol. Her history is obtained from the chart. According to her H&P, her husband drove her to [facility] ED and there she required prolonged resuscitation, with multiple drugs and shocks, but pulses returned in about 45 minutes. She was transferred to [facility] ED where she was intubated and sedated. She was started on Nimbex. She is also on Levophed for pressure support. Her initial QT was not significantly prolonged however with cooler, her QT is markedly prolonged at this time.  She was on the Arctic sun protocol.  She has re-warmed and is waking up , moving  She is currently on amiodarone drip.    Objective:    Vital Signs:   Temp:  [90.7 F (32.6 C)-98.6 F (37 C)] 98.6 F (37 C) (03/15 0500) Pulse Rate:  [45-153] 81 (03/15 0735) Resp:  [5-28] 28 (03/15 0735) BP: (104-160)/(45-76) 135/57 mmHg (03/15 0735) SpO2:  [89 %-100 %] 100 % (03/15 0735) Arterial Line BP: (91-163)/(52-86) 122/61 mmHg (03/15 0700) FiO2 (%):  [30 %-60 %] 30 % (03/15 0735) Weight:  [182 lb 5.1 oz (82.7 kg)] 182 lb 5.1 oz (82.7 kg) (03/15 0600)      24-hour weight change: Weight change: 16 lb 12.1 oz (7.6 kg)  Weight trends: Filed Weights   05/18/12 1615 05/19/12 0600 05/20/12 0600  Weight: 168 lb 10.4 oz (76.5 kg) 172 lb 2.9 oz (78.1 kg) 182 lb 5.1 oz (82.7 kg)    Intake/Output:  03/14 0701 - 03/15 0700 In: 3274.4 [I.V.:2574.4; IV Piggyback:700] Out: 468 [Urine:468]     Physical Exam: BP 135/57  Pulse 81  Temp(Src) 98.6 F (37 C)  (Core (Comment))  Resp 28  Ht 5\' 6"  (1.676 m)  Wt 182 lb 5.1 oz (82.7 kg)  BMI 29.44 kg/m2  SpO2 100%  General: Vital signs reviewed and noted.   Head: Normocephalic, atraumatic.  Eyes: conjunctivae/corneas clear.  EOM's intact.   Throat: normal  Neck:  normal  Lungs:    clear anteriorly  Heart:  RR,   Abdomen:  Soft, non-tender, non-distended    Extremities: Left hand is swollen . No edema  Neurologic: A&O X3, CN II - XII are grossly intact.   Psych: Normal     Labs: BMET:  Recent Labs  05/19/12 0422  05/19/12 2357 05/20/12 0315  NA 137  < > 134* 133*  K 4.4  < > 4.1 4.7  CL 106  < > 104 102  CO2 18*  < > 18* 19  GLUCOSE 141*  < > 118* 123*  BUN 25*  < > 29* 30*  CREATININE 1.08  < > 1.10 1.11*  CALCIUM 9.0  < > 8.7 8.7  MG 2.2  --   --  1.8  PHOS  --   --   --  2.1*  < > = values in this interval not displayed.  Liver function tests:  Recent Labs  05/19/12 0422 05/20/12 0315  AST 180* 82*  ALT 150* 126*  ALKPHOS 101 113  BILITOT 0.3 0.2*  PROT 5.8* 5.9*  ALBUMIN 2.7* 2.7*    Recent Labs  05/20/12 0315  LIPASE 268*    CBC:  Recent Labs  05/18/12 1100  05/19/12 0422 05/20/12 0315  WBC 9.9  --  19.9* 27.6*  NEUTROABS 6.4  --   --   --   HGB 9.0*  < > 9.1* 9.2*  HCT 30.1*  < > 28.5* 28.9*  MCV 88.5  --  81.4 81.6  PLT 283  --  481* 498*  < > = values in this interval not displayed.  Cardiac Enzymes:  Recent Labs  05/18/12 1100 05/19/12 0422 05/20/12 0317  TROPONINI <0.30 2.17* 0.85*    Coagulation Studies:  Recent Labs  05/18/12 1100 05/18/12 1242 05/18/12 2042 05/19/12 0422 05/20/12 0315  LABPROT 43.5* 32.5* 41.2* 41.4* 55.9*  INR 5.05* 3.41* 4.70* 4.73* 7.09*    Other: No components found with this basename: POCBNP,  No results found for this basename: DDIMER,  in the last 72 hours No results found for this basename: HGBA1C,  in the last 72 hours No results found for this basename: CHOL, HDL, LDLCALC, TRIG, CHOLHDL,   in the last 72 hours No results found for this basename: TSH, T4TOTAL, FREET3, T3FREE, THYROIDAB,  in the last 72 hours No results found for this basename: VITAMINB12, FOLATE, FERRITIN, TIBC, IRON, RETICCTPCT,  in the last 72 hours   Other results:  Tele: NSR  Medications:    Infusions: . sodium chloride 20 mL/hr at 05/18/12 1711  . amiodarone (NEXTERONE PREMIX) 360 mg/200 mL dextrose 30 mg/hr (05/19/12 2141)  . cisatracurium (NIMBEX) infusion Stopped (05/20/12 0304)  . fentaNYL infusion INTRAVENOUS Stopped (05/20/12 0505)  . isoproterenol (ISUPREL) infusion Stopped (05/20/12 0257)  . midazolam (VERSED) infusion Stopped (05/20/12 0505)  . norepinephrine (LEVOPHED) Adult infusion Stopped (05/20/12 0402)  . phenylephrine (NEO-SYNEPHRINE) Adult infusion 100 mcg/min (05/20/12 0451)  . vasopressin (PITRESSIN) infusion - *FOR SHOCK* 0.03 Units/min (05/19/12 0944)    Scheduled Medications: . ampicillin-sulbactam (UNASYN) IV  1.5 g Intravenous Q6H  . antiseptic oral rinse  15 mL Mouth Rinse Q4H  . artificial tears  1 application Both Eyes [id number]  . chlorhexidine  15 mL Mouth/Throat BID  . insulin aspart  2-6 Units Subcutaneous Q4H  . insulin glargine  10 Units Subcutaneous Q24H  . pantoprazole (PROTONIX) IV  40 mg Intravenous Q24H  . THROMBI-PAD  1 each Topical Once    Assessment/ Plan:      Sudden cardiac arrest -     Atrial fibrillation   Mitral valve disease   Type II or unspecified type diabetes mellitus without mention of complication, uncontrolled   GERD (gastroesophageal reflux disease)   Ischemic cardiomyopathy   Valvular heart disease-bicuspid aortic valve s/p Mechanical replacement//MV repair   Ventricular fibrillation   Acute respiratory failure   Warfarin-induced coagulopathy   Acute encephalopathy   Metabolic acidosis   Sustained ventricular fibrillation Leukocytosis  Kidney stones Urinary tract infection ( per husband)  Plan:   Continue amiodarone drip for  now.  Rhythm looks stable.    Will need an ICD before DC if she make a meaningful recovery.  Her WBC is elevated.  Her husband reports that she was at the urologist recently and had a "slight" UTI.  This should be treated with the Unasyn . PCCM to address.    Disposition: keep in ICU today.  Possible extubation later today if tolerated. Length of Stay: 2  Thayer Headings, Brooke Bonito., MD, [facility] 05/20/2012, 7:48 AM Office [phone] Pager [phone]

## 2012-05-20 NOTE — Progress Notes (Signed)
CRITICAL VALUE ALERT  Critical value received: INR 7.1  Date of notification:  05/20/2012  Time of notification:  0420  Critical value read back:YES  Nurse who received alert:  Rontavious Albright KUFFOUR  MD notified (1st page):  [email or web address]  Time of first page:  0425  MD notified (2nd page):  Time of second page:  Responding MD:  Carolin Guernsey  Time MD responded: [phone]

## 2012-05-21 ENCOUNTER — Inpatient Hospital Stay (HOSPITAL_COMMUNITY): Payer: Medicare Other

## 2012-05-21 LAB — GLUCOSE, CAPILLARY
Glucose-Capillary: 111 mg/dL — ABNORMAL HIGH (ref 70–99)
Glucose-Capillary: 93 mg/dL (ref 70–99)

## 2012-05-21 LAB — BLOOD GAS, ARTERIAL
Bicarbonate: 17.8 mEq/L — ABNORMAL LOW (ref 20.0–24.0)
Drawn by: 10006
O2 Saturation: 99.3 %
PEEP: 5 cmH2O
RATE: 18 resp/min
pH, Arterial: 7.353 (ref 7.350–7.450)
pO2, Arterial: 75.8 mmHg — ABNORMAL LOW (ref 80.0–100.0)

## 2012-05-21 LAB — CBC
HCT: 23.5 % — ABNORMAL LOW (ref 36.0–46.0)
MCH: 26.6 pg (ref 26.0–34.0)
MCHC: 32.8 g/dL (ref 30.0–36.0)
MCV: 81 fL (ref 78.0–100.0)
Platelets: 378 10*3/uL (ref 150–400)
RDW: 15 % (ref 11.5–15.5)
WBC: 17.5 10*3/uL — ABNORMAL HIGH (ref 4.0–10.5)

## 2012-05-21 LAB — PROTIME-INR
INR: 1.66 — ABNORMAL HIGH (ref 0.00–1.49)
Prothrombin Time: 19.1 seconds — ABNORMAL HIGH (ref 11.6–15.2)

## 2012-05-21 LAB — MAGNESIUM: Magnesium: 2.2 mg/dL (ref 1.5–2.5)

## 2012-05-21 LAB — BASIC METABOLIC PANEL
BUN: 33 mg/dL — ABNORMAL HIGH (ref 6–23)
BUN: 31 mg/dL — ABNORMAL HIGH (ref 6–23)
CO2: 21 mEq/L (ref 19–32)
Calcium: 8.4 mg/dL (ref 8.4–10.5)
Creatinine, Ser: 1.42 mg/dL — ABNORMAL HIGH (ref 0.50–1.10)
GFR calc Af Amer: 42 mL/min — ABNORMAL LOW (ref >90)
GFR calc non Af Amer: 36 mL/min — ABNORMAL LOW (ref >90)
Glucose, Bld: 89 mg/dL (ref 70–99)
Potassium: 4 mEq/L (ref 3.5–5.1)
Sodium: 135 mEq/L (ref 135–145)

## 2012-05-21 LAB — HEPATIC FUNCTION PANEL
AST: 309 U/L — ABNORMAL HIGH (ref 0–37)
Albumin: 2.4 g/dL — ABNORMAL LOW (ref 3.5–5.2)
Bilirubin, Direct: 0.1 mg/dL (ref 0.0–0.3)
Total Bilirubin: 0.3 mg/dL (ref 0.3–1.2)

## 2012-05-21 LAB — LIPASE, BLOOD: Lipase: 238 U/L — ABNORMAL HIGH (ref 11–59)

## 2012-05-21 MED ORDER — POTASSIUM CHLORIDE 20 MEQ/15ML (10%) PO LIQD
20.0000 meq | Freq: Every day | ORAL | Status: DC
  Administered 2012-05-21 – 2012-05-22 (×2): 20 meq
  Filled 2012-05-21 (×6): qty 15

## 2012-05-21 MED ORDER — FUROSEMIDE 10 MG/ML IJ SOLN
40.0000 mg | Freq: Once | INTRAMUSCULAR | Status: AC
  Administered 2012-05-21: 40 mg via INTRAVENOUS
  Filled 2012-05-21: qty 4

## 2012-05-21 NOTE — Progress Notes (Signed)
ANTIBIOTIC CONSULT NOTE - FOLLOW UP  Pharmacy Consult for Unasyn Indication: ?aspiration PNA  No Known Allergies  Patient Measurements: Height: 5\' 6"  (167.6 cm) Weight: 182 lb 5.1 oz (82.7 kg) IBW/kg (Calculated) : 59.3  Vital Signs: Temp: 98.5 F (36.9 C) (03/16 1200) Temp src: Oral (03/16 1200) BP: 118/61 mmHg (03/16 1100) Pulse Rate: 89 (03/16 1159) Labs:  Recent Labs  05/19/12 0422  05/19/12 2357 05/20/12 0315 05/21/12 0549  WBC 19.9*  --   --  27.6* 17.5*  HGB 9.1*  --   --  9.2* 7.7*  PLT 481*  --   --  498* 378  CREATININE 1.08  < > 1.10 1.11* 1.42*  < > = values in this interval not displayed. Estimated Creatinine Clearance: 39.4 ml/min (by C-G formula based on Cr of 1.42).  Assessment: 27 yof s/p CPR now s/p [employer] protocol. Is on empiric Unasyn for coverage of aspiration pneumonia. CXR does not show changes in effusions. WBC is 17.5, patient is afebrile. Renal function has remained relatively stable- CrCl is >21mL/min.  Goal of Therapy:  Eradication of infection  Plan:  1. Continue Unasyn 1.5gm IV Q6H 2. F/u renal fxn, C&S, clinical status, LOT  Benjamin Casanas D. [place], PharmD Clinical Pharmacist Pager: [phone] 05/21/2012 12:52 PM

## 2012-05-21 NOTE — Progress Notes (Signed)
PULMONARY  / CRITICAL CARE MEDICINE  Name: [name] MRN: [id number] DOB: Jun 11, 1940    ADMISSION DATE:  05/18/2012 CONSULTATION DATE:  3/13  REFERRING MD :  Maryan Rued  CHIEF COMPLAINT:  Cardiac arrest   BRIEF PATIENT DESCRIPTION:  72 year old female w/ h/o ICM (EF 35%), AF, severe AS and MR. Recently started on Tikosyn. Was in usual state of health until 3/13 when she was riding in the car and became suddenly unresponsive. Her husband estimates 6 minutes driving to ER. On arrival found to be in VF arrest. Team estimates 45 minutes of ACLS to ROSC. PCCM asked to admit.   SIGNIFICANT EVENTS / STUDIES:  CT head 3/12>>> Echo 3/12>>>  LINES / TUBES: OETT 3/12>>> Right IJ CVL 3/12>>> Right rad aline 3/12>>>  CULTURES: Sputum 3/12>>>  ANTIBIOTICS: Unasyn 3/12>>>  SUBJECTIVE: Arousable, following command, N/V with elevated lipase overnight.  VITAL SIGNS: Temp:  [98.2 F (36.8 C)-99.1 F (37.3 C)] 98.2 F (36.8 C) (03/16 0800) Pulse Rate:  [39-88] 88 (03/16 1000) Resp:  [10-24] 15 (03/16 1000) BP: (90-126)/(39-69) 126/63 mmHg (03/16 1000) SpO2:  [91 %-100 %] 100 % (03/16 1000) Arterial Line BP: (84-138)/(47-82) 92/79 mmHg (03/16 0440) FiO2 (%):  [30 %] 30 % (03/16 0958) HEMODYNAMICS: CVP:  [15 mmHg-18 mmHg] 15 mmHg VENTILATOR SETTINGS: Vent Mode:  [-] PSV;CPAP FiO2 (%):  [30 %] 30 % Set Rate:  [18 bmp] 18 bmp Vt Set:  [480 mL] 480 mL PEEP:  [5 cmH20] 5 cmH20 Pressure Support:  [5 cmH20-8 cmH20] 8 cmH20 Plateau Pressure:  [18 cmH20-21 cmH20] 18 cmH20 INTAKE / OUTPUT: Intake/Output     03/15 0701 - 03/16 0700 03/16 0701 - 03/17 0700   I.V. (mL/kg) 1625.5 (19.7) 189.9 (2.3)   IV Piggyback 770 50   Total Intake(mL/kg) 2395.5 (29) 239.9 (2.9)   Urine (mL/kg/hr) 309 (0.2) 285 (1)   Total Output 309 285   Net +2086.5 -45.1          PHYSICAL EXAMINATION: General:  Critically ill appearing white female, responsive and following command, nodding appropriately. Neuro:  Responsive, following command and moving ext to command. HEENT:  Orally intubated. Right IJ CVl in good position  Cardiovascular: RRR Lungs:  Diffuse rhonchi/rales.  Abdomen:  Soft, non-tender. + bowel sounds  Musculoskeletal:  Intact  Skin:  Intact   LABS:  Recent Labs Lab 05/18/12 1242 05/18/12 1250 05/18/12 1309  05/18/12 2042  05/19/12 0421 05/19/12 0422  05/19/12 2357 05/20/12 0315 05/20/12 0317 05/20/12 0405 05/20/12 0917 05/20/12 1517 05/21/12 0522 05/21/12 0549  HGB  --   --   --   < >  --   < >  --  9.1*  --   --  9.2*  --   --   --   --   --  7.7*  WBC  --   --   --   --   --   --   --  19.9*  --   --  27.6*  --   --   --   --   --  17.5*  PLT  --   --   --   --   --   --   --  481*  --   --  498*  --   --   --   --   --  378  NA 136  --   --   < >  --   < >  --  137  < > 134* 133*  --   --   --   --   --  136  K 4.4  --   --   < >  --   < >  --  4.4  < > 4.1 4.7  --   --   --   --   --  4.2  CL 102  --   --   < >  --   < >  --  106  < > 104 102  --   --   --   --   --  104  CO2 16*  --   --   --   --   --   --  18*  < > 18* 19  --   --   --   --   --  18*  GLUCOSE 279*  --   --   < >  --   < >  --  141*  < > 118* 123*  --   --   --   --   --  114*  BUN 19  --   --   < >  --   < >  --  25*  < > 29* 30*  --   --   --   --   --  33*  CREATININE 1.05  --   --   < >  --   < >  --  1.08  < > 1.10 1.11*  --   --   --   --   --  1.42*  CALCIUM 10.1  --   --   --   --   --   --  9.0  < > 8.7 8.7  --   --   --   --   --  8.4  MG 2.7*  --   --   --   --   --   --  2.2  --   --  1.8  --   --   --   --   --  2.2  PHOS  --   --   --   --   --   --   --   --   --   --  2.1*  --   --   --   --   --  4.3  AST  --   --   --   --   --   --   --  180*  --   --  82*  --   --   --   --   --  309*  ALT  --   --   --   --   --   --   --  150*  --   --  126*  --   --   --   --   --  286*  ALKPHOS  --   --   --   --   --   --   --  101  --   --  113  --   --   --   --   --  99  BILITOT  --    --   --   --   --   --   --  0.3  --   --  0.2*  --   --   --   --   --  0.3  PROT  --   --   --   --   --   --   --  5.8*  --   --  5.9*  --   --   --   --   --  5.5*  ALBUMIN  --   --   --   --   --   --   --  2.7*  --   --  2.7*  --   --   --   --   --  2.4*  APTT 41*  --   --   --  54*  --   --  81*  --   --   --   --   --   --   --   --   --   INR 3.41*  --   --   --  4.70*  --   --  4.73*  --   --  7.09*  --   --   --   --   --  1.66*  LATICACIDVEN  --  7.87*  --   --   --   --  2.6*  --   --   --   --   --   --   --   --   --   --   TROPONINI  --   --   --   --   --   --   --  2.17*  --   --   --  0.85*  --  0.69* 0.65*  --   --   PROCALCITON  --  <0.10  --   --   --   --   --  4.74  --   --  3.59  --   --   --   --   --   --   PHART  --   --  7.292*  --   --   --   --   --   --   --   --   --  7.357  --   --  7.353  --   PCO2ART  --   --  38.4  --   --   --   --   --   --   --   --   --  33.3*  --   --  32.7*  --   PO2ART  --   --  109.0*  --   --   --   --   --   --   --   --   --  132.0*  --   --  75.8*  --   < > = values in this interval not displayed.  Recent Labs Lab 05/20/12 1718 05/20/12 1945 05/20/12 2015 05/20/12 2344 05/21/12 0323  GLUCAP 85 99 90 93 106*   CXR: diffuse pulmonary infiltrates. OETT in good position. CVL in good position   ASSESSMENT / PLAN:  PULMONARY A: Acute respiratory failure s/p cardiac arrest Diffuse pulmonary infiltrates: not clear if this is d/t edema or aspiration. No report of vomiting.  P:   - Continue PS trials but no extubation until more hemodynamically stable. - Sedation protocol. - See ID section.  CARDIOVASCULAR A:  VF arrest Afib  ICM w/ EF 35% Cardiogenic shock Severe AS and mitral valve disease.  Currently on low dose levophed.  P:  - KVO IVF. - Levophed for MAP >  85 - Will titrate pressors as tolerated. - Ok to cardiovert intermittently while being cooled per discussion with cards and and family.  If continuous  shocks are needed will not continue that.  RENAL A:   +anion gap metabolic acidosis P:   - Continue resuscitation efforts - F/u BMET - Replace electrolytes as needed. - Replace Mg and Phos.  GASTROINTESTINAL A:   GERD Elevated LFTs: likely shock liver.  Lipase elevated P:   - PPI - NPO since anticipate will be able to extubate in AM if patient improves from a hemodynamic standpoint. - F/U lipase and LFT in AM.  HEMATOLOGIC A:   Chronic anemia Warfarin induced coagulopathy  P:  - Goal INR 2-2.5, now 4.7 but patient is being cooled, will not reverse further given artificial valve and the fact that patient is not bleeding.  INFECTIOUS A:  Possible aspiration  P:   - PCT algorithm. - Sputum culture. - Empiric unasyn.  ENDOCRINE A:  DM P:   - SSI protocol   NEUROLOGIC A:  Acute encephalopathy Essentially resolved, following commands. P:   - Hypothermia protocol complete. - Minimize sedation as tolerated.  I have personally obtained a history, examined the patient, evaluated laboratory and imaging results, formulated the assessment and plan and placed orders.  CRITICAL CARE: The patient is critically ill with multiple organ systems failure and requires high complexity decision making for assessment and support, frequent evaluation and titration of therapies, application of advanced monitoring technologies and extensive interpretation of multiple databases. Critical Care Time devoted to patient care services described in this note is 35 minutes.   Rush Farmer, M.D. Pulmonary and [place] Pager: [phone]  05/21/2012, 10:24 AM

## 2012-05-21 NOTE — Progress Notes (Signed)
PROGRESS NOTE  Subjective:   [name] is a 72 year old woman with h/o severe AS, mitral regurgitation, aneurysm of the ascending thoracic aorta (status post aortic valve replacement, mitral valve repair, resection and grafting of the ascending thoracic aortic aneurysm on Jul 07, 2006), CAD s/p CABG, AF s/p recent Tikosyn initiation and LV dysfunction who presented yesterday after out-of-hospital VF arrest. K 4.4. Mg 2.7. Ms. Cohill is intubated, sedated and on cooling per protocol. Her history is obtained from the chart. According to her H&P, her husband drove her to [facility] ED and there she required prolonged resuscitation, with multiple drugs and shocks, but pulses returned in about 45 minutes. She was transferred to [facility] ED where she was intubated and sedated. She was started on Nimbex. She is also on Levophed for pressure support. Her initial QT was not significantly prolonged however with cooler, her QT is markedly prolonged at this time.  She was on the Arctic sun protocol.  She has re-warmed and is waking up , moving  She is currently on amiodarone drip.   She continues to have episodes  ( NSVT) with any stimulation.   Objective:    Vital Signs:   Temp:  [98.2 F (36.8 C)-99.1 F (37.3 C)] 98.2 F (36.8 C) (03/16 0800) Pulse Rate:  [39-88] 85 (03/16 0738) Resp:  [10-22] 17 (03/16 0738) BP: (90-119)/(39-69) 119/69 mmHg (03/16 0800) SpO2:  [91 %-100 %] 100 % (03/16 0738) Arterial Line BP: (84-138)/(47-82) 92/79 mmHg (03/16 0440) FiO2 (%):  [30 %] 30 % (03/16 0738)      24-hour weight change: Weight change:   Weight trends: Filed Weights   05/18/12 1615 05/19/12 0600 05/20/12 0600  Weight: 168 lb 10.4 oz (76.5 kg) 172 lb 2.9 oz (78.1 kg) 182 lb 5.1 oz (82.7 kg)    Intake/Output:  03/15 0701 - 03/16 0700 In: 2393.7 [I.V.:1623.7; IV Piggyback:770] Out: 309 [id number] Total I/O In: 61.5 [I.V.:61.5] Out: -    Physical Exam: BP 119/69  Pulse 85  Temp(Src) 98.2 F  (36.8 C) (Oral)  Resp 17  Ht 5\' 6"  (1.676 m)  Wt 182 lb 5.1 oz (82.7 kg)  BMI 29.44 kg/m2  SpO2 100%  General: Vital signs reviewed and noted.   Head: Normocephalic, atraumatic.  Eyes: conjunctivae/corneas clear.  EOM's intact.   Throat: normal  Neck:  normal  Lungs:   clear anteriorly  Heart:  RR,   Abdomen:  Soft, non-tender, non-distended    Extremities: Left hand is swollen . No edema  Neurologic: A&O X3, CN II - XII are grossly intact.   Psych: Normal     Labs: BMET:  Recent Labs  05/20/12 0315 05/21/12 0549  NA 133* 136  K 4.7 4.2  CL 102 104  CO2 19 18*  GLUCOSE 123* 114*  BUN 30* 33*  CREATININE 1.11* 1.42*  CALCIUM 8.7 8.4  MG 1.8 2.2  PHOS 2.1* 4.3    Liver function tests:  Recent Labs  05/20/12 0315 05/21/12 0549  AST 82* 309*  ALT 126* 286*  ALKPHOS 113 99  BILITOT 0.2* 0.3  PROT 5.9* 5.5*  ALBUMIN 2.7* 2.4*    Recent Labs  05/20/12 0315 05/21/12 0549  LIPASE 268* 238*    CBC:  Recent Labs  05/18/12 1100  05/20/12 0315 05/21/12 0549  WBC 9.9  < > 27.6* 17.5*  NEUTROABS 6.4  --   --   --   HGB 9.0*  < > 9.2* 7.7*  HCT  30.1*  < > 28.9* 23.5*  MCV 88.5  < > 81.6 81.0  PLT 283  < > 498* 378  < > = values in this interval not displayed.  Cardiac Enzymes:  Recent Labs  05/19/12 0422 05/20/12 0317 05/20/12 0917 05/20/12 1517  TROPONINI 2.17* 0.85* 0.69* 0.65*    Coagulation Studies:  Recent Labs  05/18/12 1242 05/18/12 2042 05/19/12 0422 05/20/12 0315 05/21/12 0549  LABPROT 32.5* 41.2* 41.4* 55.9* 19.1*  INR 3.41* 4.70* 4.73* 7.09* 1.66*    Other: No components found with this basename: POCBNP,  No results found for this basename: DDIMER,  in the last 72 hours No results found for this basename: HGBA1C,  in the last 72 hours No results found for this basename: CHOL, HDL, LDLCALC, TRIG, CHOLHDL,  in the last 72 hours No results found for this basename: TSH, T4TOTAL, FREET3, T3FREE, THYROIDAB,  in the last 72  hours No results found for this basename: VITAMINB12, FOLATE, FERRITIN, TIBC, IRON, RETICCTPCT,  in the last 72 hours   Other results:  Tele: NSR  Medications:    Infusions: . sodium chloride 20 mL/hr at 05/20/12 1900  . amiodarone (NEXTERONE PREMIX) 360 mg/200 mL dextrose 30 mg/hr (05/20/12 2054)  . cisatracurium (NIMBEX) infusion Stopped (05/20/12 0304)  . fentaNYL infusion INTRAVENOUS 50 mcg/hr (05/20/12 1952)  . isoproterenol (ISUPREL) infusion Stopped (05/20/12 0257)  . midazolam (VERSED) infusion Stopped (05/20/12 0505)  . norepinephrine (LEVOPHED) Adult infusion Stopped (05/20/12 0402)  . phenylephrine (NEO-SYNEPHRINE) Adult infusion 50 mcg/min (05/20/12 1700)  . vasopressin (PITRESSIN) infusion - *FOR SHOCK* 0.03 Units/min (05/19/12 0944)    Scheduled Medications: . ampicillin-sulbactam (UNASYN) IV  1.5 g Intravenous Q6H  . antiseptic oral rinse  15 mL Mouth Rinse Q4H  . artificial tears  1 application Both Eyes [id number]  . chlorhexidine  15 mL Mouth/Throat BID  . insulin aspart  2-6 Units Subcutaneous Q4H  . insulin glargine  10 Units Subcutaneous Q24H  . pantoprazole (PROTONIX) IV  40 mg Intravenous Q24H  . 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  She continues to have NSVT.    She is volume overloaded,  Urine output is minimal .  Will give a dose of  Lasix IV and KCl per NG.  Will need an ICD before DC if she make a meaningful recovery.  Her WBC is elevated.  Her husband reports that she was at the urologist recently and had a "slight" UTI.  This should be treated with the Unasyn . PCCM to address.    Disposition: keep in ICU today.  Possible extubation later today if tolerated. Length of Stay: 3  Thayer Headings, Brooke Bonito., MD, [facility] 05/21/2012, 8:05 AM Office [phone] Pager [phone]

## 2012-05-22 ENCOUNTER — Inpatient Hospital Stay (HOSPITAL_COMMUNITY): Payer: Medicare Other

## 2012-05-22 ENCOUNTER — Other Ambulatory Visit: Payer: Medicare Other

## 2012-05-22 DIAGNOSIS — M7989 Other specified soft tissue disorders: Secondary | ICD-10-CM

## 2012-05-22 LAB — BASIC METABOLIC PANEL
BUN: 29 mg/dL — ABNORMAL HIGH (ref 6–23)
CO2: 21 mEq/L (ref 19–32)
Chloride: 103 mEq/L (ref 96–112)
Creatinine, Ser: 1.32 mg/dL — ABNORMAL HIGH (ref 0.50–1.10)
GFR calc Af Amer: 46 mL/min — ABNORMAL LOW (ref >90)
Glucose, Bld: 89 mg/dL (ref 70–99)
Potassium: 3.5 mEq/L (ref 3.5–5.1)

## 2012-05-22 LAB — GLUCOSE, CAPILLARY
Glucose-Capillary: 201 mg/dL — ABNORMAL HIGH (ref 70–99)
Glucose-Capillary: 100 mg/dL — ABNORMAL HIGH (ref 70–99)
Glucose-Capillary: 151 mg/dL — ABNORMAL HIGH (ref 70–99)

## 2012-05-22 LAB — POCT I-STAT 3, ART BLOOD GAS (G3+)
Bicarbonate: 20 mEq/L (ref 20.0–24.0)
Patient temperature: 98.6
TCO2: 21 mmol/L (ref 0–100)
pH, Arterial: 7.425 (ref 7.350–7.450)

## 2012-05-22 LAB — CBC
HCT: 22.3 % — ABNORMAL LOW (ref 36.0–46.0)
Hemoglobin: 7.4 g/dL — ABNORMAL LOW (ref 12.0–15.0)
MCV: 78.8 fL (ref 78.0–100.0)
WBC: 15.2 10*3/uL — ABNORMAL HIGH (ref 4.0–10.5)

## 2012-05-22 LAB — HEPARIN LEVEL (UNFRACTIONATED)
Heparin Unfractionated: 0.12 IU/mL — ABNORMAL LOW (ref 0.30–0.70)
Heparin Unfractionated: 0.14 IU/mL — ABNORMAL LOW (ref 0.30–0.70)

## 2012-05-22 LAB — PROTIME-INR: Prothrombin Time: 16.7 seconds — ABNORMAL HIGH (ref 11.6–15.2)

## 2012-05-22 LAB — APTT: aPTT: 30 seconds (ref 24–37)

## 2012-05-22 MED ORDER — OSMOLITE 1.2 CAL PO LIQD
1000.0000 mL | ORAL | Status: DC
  Administered 2012-05-22 – 2012-05-25 (×4): 1000 mL
  Filled 2012-05-22 (×6): qty 1000

## 2012-05-22 MED ORDER — WARFARIN SODIUM 2 MG PO TABS
2.0000 mg | ORAL_TABLET | Freq: Once | ORAL | Status: AC
  Administered 2012-05-22: 2 mg via ORAL
  Filled 2012-05-22: qty 1

## 2012-05-22 MED ORDER — HEPARIN (PORCINE) IN NACL 100-0.45 UNIT/ML-% IJ SOLN
1100.0000 [IU]/h | INTRAMUSCULAR | Status: DC
  Administered 2012-05-22: 1100 [IU]/h via INTRAVENOUS
  Filled 2012-05-22 (×2): qty 250

## 2012-05-22 MED ORDER — MAGNESIUM SULFATE 50 % IJ SOLN
1.0000 g | Freq: Once | INTRAVENOUS | Status: DC
  Filled 2012-05-22: qty 2

## 2012-05-22 MED ORDER — HEPARIN (PORCINE) IN NACL 100-0.45 UNIT/ML-% IJ SOLN
1300.0000 [IU]/h | INTRAMUSCULAR | Status: DC
  Administered 2012-05-22: 1350 [IU]/h via INTRAVENOUS
  Administered 2012-05-23 – 2012-05-24 (×3): 1600 [IU]/h via INTRAVENOUS
  Administered 2012-05-25: 1500 [IU]/h via INTRAVENOUS
  Administered 2012-05-26: 1350 [IU]/h via INTRAVENOUS
  Administered 2012-05-27: 1200 [IU]/h via INTRAVENOUS
  Administered 2012-05-27: 1350 [IU]/h via INTRAVENOUS
  Administered 2012-05-28: 1200 [IU]/h via INTRAVENOUS
  Administered 2012-05-28: 1300 [IU]/h via INTRAVENOUS
  Filled 2012-05-22 (×12): qty 250

## 2012-05-22 MED ORDER — WARFARIN - PHARMACIST DOSING INPATIENT
Freq: Every day | Status: DC
  Administered 2012-05-24 – 2012-06-07 (×3)

## 2012-05-22 MED ORDER — SODIUM CHLORIDE 0.9 % IV SOLN
10.0000 ug/h | INTRAVENOUS | Status: DC
  Administered 2012-05-22: 10 ug/h via INTRAVENOUS
  Administered 2012-05-22: 20 ug/h via INTRAVENOUS
  Filled 2012-05-22: qty 50

## 2012-05-22 MED ORDER — POTASSIUM CHLORIDE 20 MEQ/15ML (10%) PO LIQD
40.0000 meq | Freq: Once | ORAL | Status: AC
  Administered 2012-05-22: 40 meq via ORAL
  Filled 2012-05-22: qty 30

## 2012-05-22 MED ORDER — SODIUM CHLORIDE 0.9 % IV SOLN
25.0000 ug/h | INTRAVENOUS | Status: DC
  Administered 2012-05-22: 100 ug/h via INTRAVENOUS

## 2012-05-22 MED ORDER — PRO-STAT SUGAR FREE PO LIQD
30.0000 mL | Freq: Four times a day (QID) | ORAL | Status: DC
  Administered 2012-05-22 – 2012-05-25 (×15): 30 mL
  Filled 2012-05-22 (×19): qty 30

## 2012-05-22 MED ORDER — MAGNESIUM SULFATE IN D5W 10-5 MG/ML-% IV SOLN
1.0000 g | Freq: Once | INTRAVENOUS | Status: AC
  Administered 2012-05-22: 1 g via INTRAVENOUS
  Filled 2012-05-22: qty 100

## 2012-05-22 MED ORDER — FENTANYL CITRATE 0.05 MG/ML IJ SOLN
25.0000 ug | INTRAMUSCULAR | Status: DC | PRN
  Administered 2012-05-22 (×2): 100 ug via INTRAVENOUS
  Filled 2012-05-22 (×2): qty 2

## 2012-05-22 MED ORDER — MAGNESIUM SULFATE 40 MG/ML IJ SOLN
2.0000 g | Freq: Once | INTRAMUSCULAR | Status: AC
  Administered 2012-05-22: 2 g via INTRAVENOUS
  Filled 2012-05-22: qty 50

## 2012-05-22 MED ORDER — FUROSEMIDE 10 MG/ML IJ SOLN
40.0000 mg | Freq: Once | INTRAMUSCULAR | Status: AC
  Administered 2012-05-22: 40 mg via INTRAVENOUS
  Filled 2012-05-22: qty 4

## 2012-05-22 NOTE — Progress Notes (Signed)
Patient: [name] Date of Encounter: 05/22/2012, 7:55 AM Admit date: 05/18/2012     Subjective  [name] is a 72 year old woman with h/o severe AS, mitral regurgitation, aneurysm of the ascending thoracic aorta (status post aortic valve replacement, mitral valve repair, resection and grafting of the ascending thoracic aortic aneurysm on Jul 07, 2006), CAD s/p CABG, AF s/p recent Tikosyn initiation and LV dysfunction who presented yesterday after out-of-hospital VF arrest. K 4.4. Mg 2.7. [name] is intubated, sedated and on cooling per protocol. Her history is obtained from the chart. According to her H&P, her husband drove her to [facility] ED and there she required prolonged resuscitation, with multiple drugs and shocks, but pulses returned in about 45 minutes. She was transferred to [facility] ED where she was intubated and sedated. Her initial QT was difficult to assess however, with cooling, she did have marked QT prolongation with recurrent VT/VF.  This morning, [name] is awake and following commands. She is currently on SBT. Husband and cousin are at the bedside. Remains on neosynephrine for pressure support. Also on amiodarone infusion and IV heparin.    Objective  Physical Exam: Vitals: BP 96/44  Pulse 66  Temp(Src) 98.2 F (36.8 C) (Oral)  Resp 18  Ht 5\' 6"  (1.676 m)  Wt 184 lb 1.4 oz (83.5 kg)  BMI 29.73 kg/m2  SpO2 100% General: Well developed, ill appearing 72 year old female who is awake but intubated.. Neck: Supple. Difficult to assess JVD. Lungs: Coarse breath sounds bilaterally. No wheezes. Breathing is unlabored. Heart: S1 S2 present without murmur, rub or gallop.  Abdomen: Soft, non-distended. Extremities: No clubbing or cyanosis. LUE edema. Distal pedal pulses are 2+ and equal bilaterally. Neuro: Alert. Responds to name and follows commands.   Intake/Output:  Intake/Output Summary (Last 24 hours) at 05/22/12 0755 Last data filed at 05/22/12 0600  Gross per 24 hour   Intake 1816.04 ml  Output   1625 ml  Net 191.04 ml    Inpatient Medications:  . ampicillin-sulbactam (UNASYN) IV  1.5 g Intravenous Q6H  . antiseptic oral rinse  15 mL Mouth Rinse Q4H  . chlorhexidine  15 mL Mouth/Throat BID  . insulin aspart  2-6 Units Subcutaneous Q4H  . insulin glargine  10 Units Subcutaneous Q24H  . pantoprazole (PROTONIX) IV  40 mg Intravenous Q24H  . potassium chloride  20 mEq Per Tube Daily  . THROMBI-PAD  1 each Topical Once  . Warfarin - Pharmacist Dosing Inpatient   Does not apply q1800   . sodium chloride 20 mL/hr at 05/20/12 1900  . amiodarone (NEXTERONE PREMIX) 360 mg/200 mL dextrose 30 mg/hr (05/21/12 2215)  . fentaNYL infusion INTRAVENOUS 50 mcg/hr (05/20/12 1952)  . heparin 1,100 Units/hr (05/22/12 0522)  . isoproterenol (ISUPREL) infusion Stopped (05/20/12 0257)  . midazolam (VERSED) infusion 1 mg/hr (05/21/12 1316)  . norepinephrine (LEVOPHED) Adult infusion Stopped (05/20/12 0402)  . phenylephrine (NEO-SYNEPHRINE) Adult infusion 20 mcg/min (05/22/12 0620)  . vasopressin (PITRESSIN) infusion - *FOR SHOCK* 0.03 Units/min (05/19/12 0944)    Labs:  Recent Labs  05/21/12 0549 05/21/12 1639 05/22/12 0300  NA 136 135 135  K 4.2 4.0 3.5  CL 104 103 103  CO2 18* 21 21  GLUCOSE 114* 89 89  BUN 33* 31* 29*  CREATININE 1.42* 1.46* 1.32*  CALCIUM 8.4 8.2* 8.1*  MG 2.2  --  1.8  PHOS 4.3  --  3.3    Recent Labs  05/20/12 0315 05/21/12 0549  AST 82* 309*  ALT 126* 286*  ALKPHOS 113 99  BILITOT 0.2* 0.3  PROT 5.9* 5.5*  ALBUMIN 2.7* 2.4*    Recent Labs  05/20/12 0315 05/21/12 0549  LIPASE 268* 238*    Recent Labs  05/21/12 0549 05/22/12 0300  WBC 17.5* 15.2*  HGB 7.7* 7.4*  HCT 23.5* 22.3*  MCV 81.0 78.8  PLT 378 296    Recent Labs  05/20/12 0317 05/20/12 0917 05/20/12 1517  TROPONINI 0.85* 0.69* 0.65*    Recent Labs  05/22/12 0500  INR 1.39    Radiology/Studies: Dg Chest Port 1 View  05/22/2012   *RADIOLOGY REPORT*  Clinical Data: Endotracheal tube position.  PORTABLE CHEST - 1 VIEW  Comparison: 05/21/2012  Findings: Endotracheal tube in place with tip about 5.2 cm above the carina.  Enteric tube has been removed.  Right central venous catheter is unchanged in position.  Cardiac enlargement with normal pulmonary vascularity.  No focal consolidation or airspace disease in the lungs.  No pneumothorax.  No blunting of costophrenic angles.  Stable appearance of postoperative changes in the mediastinum.  IMPRESSION: Endotracheal tube and right central venous catheters are unchanged in position.  Enteric tube was removed.  Cardiac enlargement.  No developing infiltrates.   Original Report Authenticated By: Lucienne Capers, M.D.    Dg Chest Port 1 View  05/21/2012  *RADIOLOGY REPORT*  Clinical Data: The atrial fibrillation.  Ischemic cardiomyopathy. Acute respiratory failure.  Endotracheal tube placement.  PORTABLE CHEST - 1 VIEW  Comparison: 05/20/2012  Findings: Endotracheal tube remains in appropriate position with the tip approximately 4.5 cm above the carina. The nasogastric has been pulled back with the tip now in the mid thoracic esophagus. Right jugular center venous catheter remains in appropriate position.  Cardiomegaly is stable.  Mild perihilar interstitial edema pattern shows no significant change.  A small bilateral pleural effusions are again noted with left retrocardiac atelectasis.  These findings show no significant change.  IMPRESSION:  1. Nasogastric tube been pulled back with the tip now in the mid thoracic esophagus. 2.  Endotracheal tube in appropriate position. 3.  Stable cardiomegaly and mild interstitial edema. 4.  No significant change in bilateral pleural effusions and left retrocardiac atelectasis.   Original Report Authenticated By: Earle Gell, M.D.     Telemetry: sinus rhythm with frequent PVCs/ventricular couplets    Assessment and Plan  1. VF arrest   2. Prolonged QT,  Tikosyn d/c'd  3. PAF  4. LV dysfunction  5. CAD  6. Valvular heart disease s/p bioprosthetic AVR and MV repair 2008  7. Anemia 8. LUE swelling, persistent, worsened since Sat  Will order LUE venous duplex study to r/o DVT. Will need ICD prior to DC. Keep Mg >2, K >4. Avoid QT prolonging medications. Extubation possibly today per PCCM. Dr. Rayann Heman to see. Signed, EDMISTEN, BROOKE PA-C  I have seen, examined the patient, and reviewed the above assessment and plan.  Changes to above are made where necessary.  She is making clinical improvement at this time.  Mental status is returning.  L arm swelling is worrisome. At this point, will try to wean vent and pressors. Check QT today now that she is fully warmed.  Continue IV heparin for now and restart coumadin when taking POs. Would anticipate ICD late this week or early next week.  Co Sign: Thompson Grayer, MD 05/22/2012 8:37 AM

## 2012-05-22 NOTE — Progress Notes (Signed)
Pt placed back on full support at this time per MD. MD did not want the patient to wean but for a couple hours. No complications noted. RT will monitor.

## 2012-05-22 NOTE — Progress Notes (Signed)
ANTICOAGULATION CONSULT NOTE - Initial Consult  Pharmacy Consult for heparin and Coumadin Indication: atrial fibrillation and AVR + MV repair  No Known Allergies  Patient Measurements: Height: 5\' 6"  (167.6 cm) Weight: 182 lb 5.1 oz (82.7 kg) IBW/kg (Calculated) : 59.3 Heparin Dosing Weight: 80kg  Vital Signs: Temp: 98.1 F (36.7 C) (03/17 0000) Temp src: Oral (03/17 0000) BP: 119/60 mmHg (03/17 0300) Pulse Rate: 73 (03/17 0345)  Labs:  Recent Labs  05/19/12 0422  05/20/12 0315 05/20/12 0317 05/20/12 0917 05/20/12 1517 05/21/12 0549 05/21/12 1639 05/22/12 0300  HGB 9.1*  --  9.2*  --   --   --  7.7*  --  7.4*  HCT 28.5*  --  28.9*  --   --   --  23.5*  --  22.3*  PLT 481*  --  498*  --   --   --  378  --  296  APTT 81*  --   --   --   --   --   --   --   --   LABPROT 41.4*  --  55.9*  --   --   --  19.1*  --   --   INR 4.73*  --  7.09*  --   --   --  1.66*  --   --   CREATININE 1.08  < > 1.11*  --   --   --  1.42* 1.46* 1.32*  TROPONINI 2.17*  --   --  0.85* 0.69* 0.65*  --   --   --   < > = values in this interval not displayed.  Estimated Creatinine Clearance: 42.4 ml/min (by C-G formula based on Cr of 1.32).   Medical History: Past Medical History  Diagnosis Date  . Anemia   . Atrial fibrillation     A.fib/flutter: s/p MAZE 2008, DCCV 2011, on coumadin; previously on flecainide, but dc'd due to worsening EF  . Diverticulitis   . Valvular heart disease     bicuspid AV/severe AS, mitral regurgitation, & aneurysm of the ascending thoracic aorta (s/p AV replacement, MV repair, resection and grafting of the ascending thoracic aortic aneurysm 2008  . CVA (cerebral vascular accident)     2008 - felt due to oscillating calcium on aortic valve  . Carcinoma in situ of vulva   . Fibroid   . Ovarian cyst, left 2008-2009  . Osteoporosis   . Diabetes mellitus   . Hypertension   . GERD (gastroesophageal reflux disease)   . Chronic systolic CHF (congestive heart  failure)     EF 35%  . Ischemic cardiomyopathy     EF 35%  . Coronary artery disease     Occluded LM 2/2 previous aortic root surgery; s/p 2v CABG 2010 LIMA to LAD, SVG to OM; Cath 03/11/12 patent SVG to OM, atretic LIMA to LAD, patent RCA, occluded native LM, EF 35%   . Shortness of breath   . Arthritis     OSTEO  . Fracture of lumbar spine     Medications:  Prescriptions prior to admission  Medication Sig Dispense Refill  . Cholecalciferol (VITAMIN D3) 50000 UNITS CAPS Take 1 tablet by mouth once a week.  12 capsule  3  . Cyanocobalamin (VITAMIN B-12) 2500 MCG SUBL Place 1 tablet under the tongue 3 (three) times a week. Dissolves 1 tablet under the tongue on Monday, Wednesday, and Friday      . dofetilide (TIKOSYN) 125 MCG capsule Take  3 capsules (375 mcg total) by mouth every 12 (twelve) hours.  180 capsule  6  . furosemide (LASIX) 20 MG tablet Take 20 mg by mouth every morning.      . magnesium oxide (MAG-OX) 400 MG tablet Take 800 mg by mouth 3 (three) times daily.      . metFORMIN (GLUCOPHAGE-XR) 500 MG 24 hr tablet Take 500-1,500 mg by mouth 2 (two) times daily with a meal. Takes 1 tablet in am and 3 tablets in pm      . metoprolol succinate (TOPROL-XL) 100 MG 24 hr tablet Take 100 mg by mouth every morning.       . metoprolol succinate (TOPROL-XL) 50 MG 24 hr tablet Take 50 mg by mouth at bedtime. Take with or immediately following a meal.      . MICARDIS 20 MG tablet TAKE 1/2 TABLET BY MOUTH EVERY DAY  30 tablet  1  . omeprazole (PRILOSEC OTC) 20 MG tablet Take 20 mg by mouth daily after breakfast.       . pioglitazone (ACTOS) 45 MG tablet Take 45 mg by mouth every morning.      Marland Kitchen spironolactone (ALDACTONE) 25 MG tablet Take 12.5 mg by mouth daily.      Marland Kitchen telmisartan (MICARDIS) 20 MG tablet Take 10 mg by mouth every morning.       . warfarin (COUMADIN) 2 MG tablet Take 2 mg by mouth 3 (three) times a week. Monday, Wednesday and Friday      . warfarin (COUMADIN) 4 MG tablet Take 4  mg by mouth 4 (four) times a week. Tuesday, Thursday, Saturday and Sunday       Scheduled:  . ampicillin-sulbactam (UNASYN) IV  1.5 g Intravenous Q6H  . antiseptic oral rinse  15 mL Mouth Rinse Q4H  . chlorhexidine  15 mL Mouth/Throat BID  . [COMPLETED] furosemide  40 mg Intravenous Once  . insulin aspart  2-6 Units Subcutaneous Q4H  . insulin glargine  10 Units Subcutaneous Q24H  . pantoprazole (PROTONIX) IV  40 mg Intravenous Q24H  . potassium chloride  20 mEq Per Tube Daily  . [COMPLETED] sodium glycerophosphate 0.9% NaCl IVPB  10 mmol Intravenous Once  . THROMBI-PAD  1 each Topical Once  . [DISCONTINUED] artificial tears  1 application Both Eyes [id number]   Infusions:  . sodium chloride 20 mL/hr at 05/20/12 1900  . amiodarone (NEXTERONE PREMIX) 360 mg/200 mL dextrose 30 mg/hr (05/21/12 2215)  . fentaNYL infusion INTRAVENOUS 50 mcg/hr (05/20/12 1952)  . isoproterenol (ISUPREL) infusion Stopped (05/20/12 0257)  . midazolam (VERSED) infusion 1 mg/hr (05/21/12 1316)  . norepinephrine (LEVOPHED) Adult infusion Stopped (05/20/12 0402)  . phenylephrine (NEO-SYNEPHRINE) Adult infusion 30 mcg/min (05/22/12 0409)  . vasopressin (PITRESSIN) infusion - *FOR SHOCK* 0.03 Units/min (05/19/12 0944)  . [DISCONTINUED] cisatracurium (NIMBEX) infusion Stopped (05/20/12 0304)    Assessment: 72yo female admitted s/p VF arrest, initially with INR of 7 which dropped to 1.6 after 1mg  of vitamin K, to begin heparin as bridge to therapeutic INR.  Goal of Therapy:  Heparin level 0.3-0.7 units/ml INR 2.5-3.5 Monitor platelets by anticoagulation protocol: Yes   Plan:  Will begin heparin gtt at 1100 units/hr without bolus and monitor heparin levels and CBC.  Will obtain current INR prior to dosing Coumadin to ensure INR not rebounding.  Wynona Neat, PharmD, BCPS  05/22/2012,4:09 AM

## 2012-05-22 NOTE — Progress Notes (Signed)
[place] Progress Note Patient Name: [name] DOB: 1940/11/21 MRN: [id number]  Date of Service  05/22/2012   HPI/Events of Note  Oliguiria noted   eICU Interventions  Give lasix.  I>>>O   Intervention Category Intermediate Interventions: Oliguria - evaluation and management  Asencion Noble 05/22/2012, 4:08 PM

## 2012-05-22 NOTE — Progress Notes (Signed)
Sub therapeutic INR; The patient with mechanical valve;   Start Heparin and coumadin per pharmacy.

## 2012-05-22 NOTE — Progress Notes (Addendum)
NUTRITION FOLLOW UP  Addendum: RN reports pt will not be extubated today, RD will order TF as below.   Intervention:   1. If pt is unable to extubate today, RD will order Osmolite 1.2 with goal rate of 40 ml/hr and 30 ml prostat QID. This EN regimen would provide 1552 kcal, 113 gm protein, and 787 ml free water.   2. If pt is able to extubate, RD will follow diet advance/PO intake.   Nutrition Dx:   Inadequate oral intake related to inability to eat as evidenced by NPO status.   Goal:   Pt will meet >/=90% estimated nutrition needs. Unmet  Monitor:   Vent status, diet advance, TF, weight trends, I/O's  Assessment:   S/p rewarming on 3/15, remains intubated at this time. Attempting to wean vent/pressors. Planned for ICD late this week or next per notes.  RD was consulted for EN initiation and management. Per RN pt is weaning, expected to extubated today. RD will hold off on TF at this time, if pt is unable to extubated today will order TF.    Height: Ht Readings from Last 1 Encounters:  05/18/12 5\' 6"  (1.676 m)    Weight Status:   Wt Readings from Last 1 Encounters:  05/22/12 184 lb 1.4 oz (83.5 kg)  165 lbs at admission- likely increased with fluid  Patient is currently intubated on ventilator support.  MV: 8.7 Temp:Temp (24hrs), Avg:98.3 F (36.8 C), Min:97.7 F (36.5 C), Max:98.9 F (37.2 C)   Re-estimated needs:  Kcal: 1507 Protein: 100-115 gm  Fluid: 1.5 L  Skin: intact   Diet Order:   NPO   Intake/Output Summary (Last 24 hours) at 05/22/12 0948 Last data filed at 05/22/12 0900  Gross per 24 hour  Intake 1860.24 ml  Output   1440 ml  Net 420.24 ml  +5.6 L this admission   Last BM: PTA   Labs:   Recent Labs Lab 05/20/12 0315  05/21/12 0549 05/21/12 1639 05/22/12 0300  NA 133*  < > 136 135 135  K 4.7  < > 4.2 4.0 3.5  CL 102  < > 104 103 103  CO2 19  < > 18* 21 21  BUN 30*  < > 33* 31* 29*  CREATININE 1.11*  < > 1.42* 1.46* 1.32*  CALCIUM  8.7  < > 8.4 8.2* 8.1*  MG 1.8  --  2.2  --  1.8  PHOS 2.1*  --  4.3  --  3.3  GLUCOSE 123*  < > 114* 89 89  < > = values in this interval not displayed.  CBG (last 3)   Recent Labs  05/22/12 0010 05/22/12 0328 05/22/12 0733  GLUCAP 93 83 81    Scheduled Meds: . ampicillin-sulbactam (UNASYN) IV  1.5 g Intravenous Q6H  . antiseptic oral rinse  15 mL Mouth Rinse Q4H  . chlorhexidine  15 mL Mouth/Throat BID  . insulin aspart  2-6 Units Subcutaneous Q4H  . insulin glargine  10 Units Subcutaneous Q24H  . pantoprazole (PROTONIX) IV  40 mg Intravenous Q24H  . potassium chloride  20 mEq Per Tube Daily  . THROMBI-PAD  1 each Topical Once  . Warfarin - Pharmacist Dosing Inpatient   Does not apply q1800    Continuous Infusions: . sodium chloride 20 mL/hr at 05/20/12 1900  . amiodarone (NEXTERONE PREMIX) 360 mg/200 mL dextrose 30 mg/hr (05/21/12 2215)  . heparin 1,100 Units/hr (05/22/12 0522)  . isoproterenol (ISUPREL) infusion Stopped (05/20/12  [id number])  . phenylephrine (NEO-SYNEPHRINE) Adult infusion 15 mcg/min (05/22/12 0800)   Orson Slick RD, LDN Pager [phone] After Hours pager [phone]

## 2012-05-22 NOTE — Progress Notes (Signed)
PULMONARY  / CRITICAL CARE MEDICINE  Name: [name] MRN: [id number] DOB: [birth year]    ADMISSION DATE:  05/18/2012 CONSULTATION DATE:  3/13  REFERRING MD :  Maryan Rued  CHIEF COMPLAINT:  Cardiac arrest   BRIEF PATIENT DESCRIPTION:  72 year old female w/ h/o ICM (EF 35%), AF, severe AS and MR. Recently started on Tikosyn. Was in usual state of health until 3/13 when she was riding in the car and became suddenly unresponsive. Her husband estimates 6 minutes driving to ER. On arrival found to be in VF arrest. Team estimates 45 minutes of ACLS to ROSC. PCCM asked to admit.   SIGNIFICANT EVENTS / STUDIES:  CT head 3/12>>> Echo 3/12>>>  LINES / TUBES: OETT 3/12>>> Right IJ CVL 3/12>>> Right rad aline 3/12>>>  CULTURES: Sputum 3/12>>> Results for orders placed during the hospital encounter of 05/18/12  MRSA PCR SCREENING     Status: None   Collection Time    05/18/12  4:20 PM      Result Value Range Status   MRSA by PCR NEGATIVE  NEGATIVE Final   Comment:            The GeneXpert MRSA Assay (FDA     approved for NASAL specimens     only), is one component of a     comprehensive MRSA colonization     surveillance program. It is not     intended to diagnose MRSA     infection nor to guide or     monitor treatment for     MRSA infections.     ANTIBIOTICS: Unasyn 3/12>>> Anti-infectives   Start     Dose/Rate Route Frequency Ordered Stop   05/18/12 1930  ampicillin-sulbactam (UNASYN) 1.5 g in sodium chloride 0.9 % 50 mL IVPB     1.5 g 100 mL/hr over 30 Minutes Intravenous Every 6 hours 05/18/12 1317     05/18/12 1330  ampicillin-sulbactam (UNASYN) 1.5 g in sodium chloride 0.9 % 50 mL IVPB     1.5 g 100 mL/hr over 30 Minutes Intravenous To Emergency Dept 05/18/12 1317 05/18/12 1409       SUBJECTIVE:    05/22/2012: Morning rounds patient was on low dose of Neo-Synephrine. Continue sedation had just been turned off and her RASS sedation score was -2. Later in the day she  came off pressors but was getting very agitated and was having copious respiratory secretions. Fentanyl sedation had to be turned back on  Has new onset left upper extremity edema  VITAL SIGNS: Temp:  [97.7 F (36.5 C)-99.6 F (37.6 C)] 99.6 F (37.6 C) (03/17 1200) Pulse Rate:  [42-105] 84 (03/17 1430) Resp:  [14-21] 18 (03/17 1430) BP: (90-140)/(39-85) 117/60 mmHg (03/17 1430) SpO2:  [97 %-100 %] 100 % (03/17 1430) FiO2 (%):  [30 %] 30 % (03/17 1220) Weight:  [83.5 kg (184 lb 1.4 oz)] 83.5 kg (184 lb 1.4 oz) (03/17 0400) HEMODYNAMICS: CVP:  [6 mmHg-16 mmHg] 6 mmHg VENTILATOR SETTINGS: Vent Mode:  [-] PRVC FiO2 (%):  [30 %] 30 % Set Rate:  [18 bmp] 18 bmp Vt Set:  [480 mL] 480 mL PEEP:  [5 cmH20] 5 cmH20 Pressure Support:  [8 cmH20] 8 cmH20 Plateau Pressure:  [19 cmH20-31 cmH20] 19 cmH20 INTAKE / OUTPUT: Intake/Output     03/16 0701 - 03/17 0700 03/17 0701 - 03/18 0700   I.V. (mL/kg) 1666 (20) 471.6 (5.6)   NG/GT  160   IV Piggyback 150 150  Total Intake(mL/kg) 1816 (21.7) 781.6 (9.4)   Urine (mL/kg/hr) 1625 (0.8) 200 (0.3)   Total Output 1625 200   Net +191 +581.6          PHYSICAL EXAMINATION: General:  Critically ill appearing white female, responsive  Neuro: RASS -2 and appears to follow some commands intermittently HEENT:  Orally intubated. Right IJ CVl in good position  Cardiovascular: RRR Lungs:  Diffuse rhonchi/rales.  Abdomen:  Soft, non-tender. + bowel sounds  Musculoskeletal:  Intact  Skin:  Intact other than some excoriations in the upper extremities  Dg Chest Port 1 View  05/22/2012  *RADIOLOGY REPORT*  Clinical Data: Endotracheal tube position.  PORTABLE CHEST - 1 VIEW  Comparison: 05/21/2012  Findings: Endotracheal tube in place with tip about 5.2 cm above the carina.  Enteric tube has been removed.  Right central venous catheter is unchanged in position.  Cardiac enlargement with normal pulmonary vascularity.  No focal consolidation or airspace  disease in the lungs.  No pneumothorax.  No blunting of costophrenic angles.  Stable appearance of postoperative changes in the mediastinum.  IMPRESSION: Endotracheal tube and right central venous catheters are unchanged in position.  Enteric tube was removed.  Cardiac enlargement.  No developing infiltrates.   Original Report Authenticated By: Lucienne Capers, M.D.    Dg Chest Port 1 View  05/21/2012  *RADIOLOGY REPORT*  Clinical Data: The atrial fibrillation.  Ischemic cardiomyopathy. Acute respiratory failure.  Endotracheal tube placement.  PORTABLE CHEST - 1 VIEW  Comparison: 05/20/2012  Findings: Endotracheal tube remains in appropriate position with the tip approximately 4.5 cm above the carina. The nasogastric has been pulled back with the tip now in the mid thoracic esophagus. Right jugular center venous catheter remains in appropriate position.  Cardiomegaly is stable.  Mild perihilar interstitial edema pattern shows no significant change.  A small bilateral pleural effusions are again noted with left retrocardiac atelectasis.  These findings show no significant change.  IMPRESSION:  1. Nasogastric tube been pulled back with the tip now in the mid thoracic esophagus. 2.  Endotracheal tube in appropriate position. 3.  Stable cardiomegaly and mild interstitial edema. 4.  No significant change in bilateral pleural effusions and left retrocardiac atelectasis.   Original Report Authenticated By: Earle Gell, M.D.        ASSESSMENT / PLAN:  Recent Labs Lab 05/18/12 1309  05/18/12 2256 05/19/12 0055 05/20/12 0405 05/21/12 0522 05/22/12 0347  PHART 7.292*  --   --   --  7.357 7.353 7.425  PCO2ART 38.4  --   --   --  33.3* 32.7* 30.5*  PO2ART 109.0*  --   --   --  132.0* 75.8* 102.0*  HCO3 18.5*  --   --   --  18.5* 17.8* 20.0  TCO2 20  < > 20 21 19.6 18.8 21  O2SAT 98.0  --   --   --  99.4 99.3 98.0  < > = values in this interval not displayed.   PULMONARY A: Acute respiratory failure  s/p cardiac arrest Diffuse pulmonary infiltrates: not clear if this is d/t edema or aspiration. No report of vomiting.  - 05/22/2012: She is not a candidate for extubation due to delirium and copious respiratory secretions P:   - Continue PS trials as tolerated but no extubation  - Sedation protocol.; Needed to go back on fentanyl infusion 05/22/2012 - See ID section.  CARDIOVASCULAR A:  VF arrest Afib  ICM w/ EF 35% Cardiogenic shock Severe AS  and mitral valve disease.   - 05/22/2012: Off pressors  midday P - KVO IVF. - Ok to cardiovert intermittently while being cooled per discussion with cards and and family.  If continuous shocks are needed will not continue that.  RENAL  Recent Labs Lab 05/18/12 1242  05/19/12 0422  05/19/12 2357 05/20/12 0315 05/20/12 0757 05/21/12 0549 05/21/12 1639 05/22/12 0300  NA 136  < > 137  < > 134* 133* 130* 136 135 135  K 4.4  < > 4.4  < > 4.1 4.7 5.2* 4.2 4.0 3.5  CL 102  < > 106  < > 104 102 101 104 103 103  CO2 16*  --  18*  < > 18* 19 18* 18* 21 21  GLUCOSE 279*  < > 141*  < > 118* 123* 191* 114* 89 89  BUN 19  < > 25*  < > 29* 30* 30* 33* 31* 29*  CREATININE 1.05  < > 1.08  < > 1.10 1.11* 1.17* 1.42* 1.46* 1.32*  CALCIUM 10.1  --  9.0  < > 8.7 8.7 8.5 8.4 8.2* 8.1*  MG 2.7*  --  2.2  --   --  1.8  --  2.2  --  1.8  PHOS  --   --   --   --   --  2.1*  --  4.3  --  3.3  < > = values in this interval not displayed.  A:  Chronic renal insufficiency +anion gap metabolic acidosis - 123456: Mild hypomagnesemia P:   - Replace magnesium 05/22/2012 - - F/u BMET - Replace electrolytes as needed. -  GASTROINTESTINAL  Recent Labs Lab 05/18/12 1100  05/18/12 2042 05/19/12 0422 05/20/12 0315 05/21/12 0549 05/22/12 0500  AST 210*  --   --  180* 82* 309*  --   ALT 149*  --   --  150* 126* 286*  --   ALKPHOS 105  --   --  101 113 99  --   BILITOT 0.2*  --   --  0.3 0.2* 0.3  --   PROT 6.0  --   --  5.8* 5.9* 5.5*  --    ALBUMIN 2.5*  --   --  2.7* 2.7* 2.4*  --   INR 5.05*  < > 4.70* 4.73* 7.09* 1.66* 1.39  < > = values in this interval not displayed.  A:   GERD Elevated LFTs: likely shock liver.  Lipase elevated - 05/22/2012: Continues to be n.p.o. P:   - PPI - start tube feeds 05/22/2012 t. - F/U lipase and LFT in AM.  HEMATOLOGIC  Recent Labs Lab 05/20/12 0315 05/21/12 0549 05/22/12 0300  HGB 9.2* 7.7* 7.4*  HCT 28.9* 23.5* 22.3*  WBC 27.6* 17.5* 15.2*  PLT 498* 378 296    Recent Labs Lab 05/18/12 2042 05/19/12 0422 05/20/12 0315 05/21/12 0549 05/22/12 0500  INR 4.70* 4.73* 7.09* 1.66* 1.39   . 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 Left upper extremity is swollen but she is a heparin drip P:  - Heparin drip per pharmacy - Goal INR 2-2.5, now 4.7 but patient is being cooled, will not reverse further given artificial valve and the fact that patient is not bleeding. - Await duplex of the left upper extremity rule out DVT  INFECTIOUS  Recent Labs Lab 05/18/12 1250 05/19/12 0421 05/19/12 0422 05/20/12 0315  LATICACIDVEN 7.87* 2.6*  --   --  PROCALCITON <0.10  --  4.74 3.59    A:  Possible aspiration  - 05/22/2012: Pro-calcitonin says is localized infection such as pneumonia P:   - PCT algorithm. - Await Sputum culture. - Empiric unasyn.  ENDOCRINE A:  DM P:   - SSI protocol   NEUROLOGIC A:  Acute encephalopathy 05/22/2012: Following commands but appears deconditioned and lethargic. P:   - Hypothermia protocol complete. - Minimize sedation as tolerated. Change continue sedation to when necessary sedation  I have personally obtained a history, examined the patient, evaluated laboratory and imaging results, formulated the assessment and plan and placed orders.  CRITICAL CARE: The patient is critically ill with multiple organ systems failure and requires high complexity decision making  for assessment and support, frequent evaluation and titration of therapies, application of advanced monitoring technologies and extensive interpretation of multiple databases. Critical Care Time devoted to patient care services described in this note is 35 minutes.    Dr. Brand Males, M.D., [facility].C.P Pulmonary and Critical Care Medicine Staff Physician [facility] Pager: [phone], If no answer or between  15:00h - 7:00h: call [phone]  05/22/2012 4:03 PM

## 2012-05-22 NOTE — Progress Notes (Signed)
[place] for heparin and Coumadin Indication: atrial fibrillation and AVR + MV repair  No Known Allergies  Patient Measurements: Height: 5\' 6"  (167.6 cm) Weight: 184 lb 1.4 oz (83.5 kg) IBW/kg (Calculated) : 59.3 Heparin Dosing Weight: 80kg  Vital Signs: Temp: 99.6 F (37.6 C) (03/17 1200) Temp src: Oral (03/17 1200) BP: 127/81 mmHg (03/17 1300) Pulse Rate: 105 (03/17 1230)  Labs:  Recent Labs  05/20/12 0315 05/20/12 0317  05/20/12 0917 05/20/12 1517 05/21/12 0549 05/21/12 1639 05/22/12 0300 05/22/12 0500 05/22/12 1230  HGB 9.2*  --   --   --   --  7.7*  --  7.4*  --   --   HCT 28.9*  --   --   --   --  23.5*  --  22.3*  --   --   PLT 498*  --   --   --   --  378  --  296  --   --   APTT  --   --   --   --   --   --   --   --  30  --   LABPROT 55.9*  --   --   --   --  19.1*  --   --  16.7*  --   INR 7.09*  --   --   --   --  1.66*  --   --  1.39  --   HEPARINUNFRC  --   --   --   --   --   --   --   --   --  0.12*  CREATININE 1.11*  --   < >  --   --  1.42* 1.46* 1.32*  --   --   TROPONINI  --  0.85*  --  0.69* 0.65*  --   --   --   --   --   < > = values in this interval not displayed. Estimated Creatinine Clearance: 42.6 ml/min (by C-G formula based on Cr of 1.32).    Infusions:  . sodium chloride 20 mL/hr at 05/20/12 1900  . amiodarone (NEXTERONE PREMIX) 360 mg/200 mL dextrose 30 mg/hr (05/21/12 2215)  . fentaNYL infusion INTRAVENOUS    . heparin 1,100 Units/hr (05/22/12 0522)  . isoproterenol (ISUPREL) infusion Stopped (05/20/12 0257)  . phenylephrine (NEO-SYNEPHRINE) Adult infusion Stopped (05/22/12 1200)  . [DISCONTINUED] cisatracurium (NIMBEX) infusion Stopped (05/20/12 0304)  . [DISCONTINUED] fentaNYL infusion INTRAVENOUS 50 mcg/hr (05/20/12 1952)  . [DISCONTINUED] midazolam (VERSED) infusion 1 mg/hr (05/21/12 1316)  . [DISCONTINUED] norepinephrine (LEVOPHED) Adult infusion Stopped (05/20/12 0402)  .  [DISCONTINUED] vasopressin (PITRESSIN) infusion - *FOR SHOCK* 0.03 Units/min (05/19/12 0944)    Assessment: 72yo female admitted s/p VF arrest noted s/p artic sun. Patient on coumadin PTA for afib on now on heparin. Patient for ICD late this week or early next week. HL today= 0.12 and INR today= 1.29. LFTs  Noted 309/286 on 3/16 with INR= 7.02 on 3/15 s/p vitamin K.  Home coumadin dose: 2mg  MWF, 4mg  TTSS  Goal of Therapy:  Heparin level 0.3-0.7 units/ml INR 2.5-3.5 Monitor platelets by anticoagulation protocol: Yes   Plan:  -Increase heparin to 1350 units/hr -Heparin level in 8hrs -Coumadin 2mg  for now (due to INR= 7.09) and increase slowly  Hildred Laser, Pharm D 05/22/2012 1:49 PM

## 2012-05-22 NOTE — Progress Notes (Signed)
VASCULAR LAB PRELIMINARY  PRELIMINARY  PRELIMINARY  PRELIMINARY  Bilateral upper extremity venous Dopplers completed.    Preliminary report:  There is SVT noted in the left basilic and cephalic veins.  There is no DVT noted, bilaterally.  Rue Tinnel, RVT 05/22/2012, 10:53 AM

## 2012-05-23 ENCOUNTER — Inpatient Hospital Stay (HOSPITAL_COMMUNITY): Payer: Medicare Other

## 2012-05-23 LAB — BASIC METABOLIC PANEL
BUN: 32 mg/dL — ABNORMAL HIGH (ref 6–23)
Calcium: 8.2 mg/dL — ABNORMAL LOW (ref 8.4–10.5)
Creatinine, Ser: 1.16 mg/dL — ABNORMAL HIGH (ref 0.50–1.10)
GFR calc Af Amer: 54 mL/min — ABNORMAL LOW (ref >90)
GFR calc non Af Amer: 46 mL/min — ABNORMAL LOW (ref >90)

## 2012-05-23 LAB — CBC
HCT: 24.1 % — ABNORMAL LOW (ref 36.0–46.0)
Hemoglobin: 7.8 g/dL — ABNORMAL LOW (ref 12.0–15.0)
MCH: 26.4 pg (ref 26.0–34.0)
MCV: 81.4 fL (ref 78.0–100.0)
RBC: 2.96 MIL/uL — ABNORMAL LOW (ref 3.87–5.11)

## 2012-05-23 LAB — PROTIME-INR
INR: 1.55 — ABNORMAL HIGH (ref 0.00–1.49)
Prothrombin Time: 18.1 seconds — ABNORMAL HIGH (ref 11.6–15.2)

## 2012-05-23 LAB — GLUCOSE, CAPILLARY
Glucose-Capillary: 96 mg/dL (ref 70–99)
Glucose-Capillary: 145 mg/dL — ABNORMAL HIGH (ref 70–99)
Glucose-Capillary: 190 mg/dL — ABNORMAL HIGH (ref 70–99)

## 2012-05-23 LAB — HEPARIN LEVEL (UNFRACTIONATED): Heparin Unfractionated: 0.56 IU/mL (ref 0.30–0.70)

## 2012-05-23 MED ORDER — FUROSEMIDE 10 MG/ML IJ SOLN
40.0000 mg | Freq: Three times a day (TID) | INTRAMUSCULAR | Status: AC
  Administered 2012-05-23 (×2): 40 mg via INTRAVENOUS
  Filled 2012-05-23 (×2): qty 4

## 2012-05-23 MED ORDER — POTASSIUM CHLORIDE 20 MEQ/15ML (10%) PO LIQD
40.0000 meq | Freq: Every day | ORAL | Status: DC
  Administered 2012-05-23 – 2012-05-25 (×2): 40 meq via ORAL
  Filled 2012-05-23 (×4): qty 30

## 2012-05-23 MED ORDER — POTASSIUM CHLORIDE 20 MEQ/15ML (10%) PO LIQD: 20.0000 meq | Freq: Every day | ORAL | Status: DC

## 2012-05-23 MED ORDER — POTASSIUM CHLORIDE 20 MEQ/15ML (10%) PO LIQD
ORAL | Status: AC
  Filled 2012-05-23: qty 15

## 2012-05-23 MED ORDER — FENTANYL CITRATE 0.05 MG/ML IJ SOLN
25.0000 ug | INTRAMUSCULAR | Status: DC | PRN
  Administered 2012-05-24: 25 ug via INTRAVENOUS
  Filled 2012-05-23: qty 2

## 2012-05-23 MED ORDER — WARFARIN SODIUM 2 MG PO TABS
2.0000 mg | ORAL_TABLET | Freq: Once | ORAL | Status: AC
  Administered 2012-05-23: 2 mg via ORAL
  Filled 2012-05-23 (×2): qty 1

## 2012-05-23 MED ORDER — POTASSIUM CHLORIDE 10 MEQ/50ML IV SOLN
10.0000 meq | INTRAVENOUS | Status: AC
  Administered 2012-05-23 (×3): 10 meq via INTRAVENOUS
  Filled 2012-05-23 (×4): qty 50

## 2012-05-23 NOTE — Progress Notes (Addendum)
Fentanyl 100cc wasted in sink with witness.  Witnessed per Earnestine Leys RN

## 2012-05-23 NOTE — Progress Notes (Signed)
PULMONARY  / CRITICAL CARE MEDICINE  Name: [name] MRN: [id number] DOB: [birth year]    ADMISSION DATE:  05/18/2012 CONSULTATION DATE:  3/13  REFERRING MD :  Maryan Rued  CHIEF COMPLAINT:  Cardiac arrest   BRIEF PATIENT DESCRIPTION:  72 year old female w/ h/o ICM (EF 35%), AF, severe AS and MR. Recently started on Tikosyn. Was in usual state of health until 3/13 when she was riding in the car and became suddenly unresponsive. Her husband estimates 6 minutes driving to ER. On arrival found to be in VF arrest. Team estimates 45 minutes of ACLS to ROSC. PCCM asked to admit.   SIGNIFICANT EVENTS / STUDIES:  CT head 3/12>>> Echo 3/12>>>  LINES / TUBES: OETT 3/12>>> Right IJ CVL 3/12>>> Right rad aline 3/12>>>  CULTURES: Sputum 3/12>>>NTD MRS NTD  ANTIBIOTICS: Unasyn 3/12>>>  SUBJECTIVE:    05/22/2012: Morning rounds patient was on low dose of Neo-Synephrine. Continue sedation had just been turned off and her RASS sedation score was -2. Later in the day she came off pressors but was getting very agitated and was having copious respiratory secretions. Fentanyl sedation had to be turned back on  Has new onset left upper extremity edema  VITAL SIGNS: Temp:  [98.1 F (36.7 C)-99.6 F (37.6 C)] 98.8 F (37.1 C) (03/18 0730) Pulse Rate:  [42-112] 89 (03/18 0800) Resp:  [17-23] 17 (03/18 0800) BP: (85-153)/(38-88) 112/49 mmHg (03/18 0800) SpO2:  [91 %-100 %] 100 % (03/18 0800) FiO2 (%):  [30 %] 30 % (03/18 0515) Weight:  [83.2 kg (183 lb 6.8 oz)] 83.2 kg (183 lb 6.8 oz) (03/18 0400) HEMODYNAMICS: CVP:  [6 mmHg-13 mmHg] 10 mmHg VENTILATOR SETTINGS: Vent Mode:  [-] PRVC FiO2 (%):  [30 %] 30 % Set Rate:  [18 bmp] 18 bmp Vt Set:  [480 mL] 480 mL PEEP:  [5 cmH20] 5 cmH20 Plateau Pressure:  [15 cmH20-20 cmH20] 18 cmH20 INTAKE / OUTPUT: Intake/Output     03/17 0701 - 03/18 0700 03/18 0701 - 03/19 0700   I.V. (mL/kg) 1357.4 (16.3) 110.4 (1.3)   NG/GT 740 40   IV Piggyback 300     Total Intake(mL/kg) 2397.4 (28.8) 150.4 (1.8)   Urine (mL/kg/hr) 2145 (1.1) 50 (0.2)   Total Output 2145 50   Net +252.4 +100.4         PHYSICAL EXAMINATION: General:  Critically ill appearing white female, responsive  Neuro: RASS -2 and appears to follow some commands intermittently HEENT:  Orally intubated. Right IJ CVl in good position  Cardiovascular: RRR Lungs:  Diffuse rhonchi/rales.  Abdomen:  Soft, non-tender. + bowel sounds  Musculoskeletal:  Intact  Skin:  Intact other than some excoriations in the upper extremities  Dg Chest Port 1 View  05/23/2012  *RADIOLOGY REPORT*  Clinical Data: Check ET tube position  PORTABLE CHEST - 1 VIEW  Comparison: 05/22/2012  Findings: Cardiomegaly with pulmonary vascular congestion.  No frank interstitial edema.  Layering small right pleural effusion. Left lung base is obscured.  No pneumothorax.  Endotracheal tube terminates 5 cm above the carina.  Right IJ venous catheter terminates in the lower SVC.  Defibrillator pads overlying the left lower lung.  Valve prosthesis.  Median sternotomy.  IMPRESSION: Endotracheal tube terminates 5 cm above the carina.  Cardiomegaly with pulmonary vascular congestion.  No frank interstitial edema.  Layering small right pleural effusion.   Original Report Authenticated By: Julian Hy, M.D.    Dg Chest Port 1 View  05/22/2012  *RADIOLOGY REPORT*  Clinical Data: Endotracheal tube position.  PORTABLE CHEST - 1 VIEW  Comparison: 05/21/2012  Findings: Endotracheal tube in place with tip about 5.2 cm above the carina.  Enteric tube has been removed.  Right central venous catheter is unchanged in position.  Cardiac enlargement with normal pulmonary vascularity.  No focal consolidation or airspace disease in the lungs.  No pneumothorax.  No blunting of costophrenic angles.  Stable appearance of postoperative changes in the mediastinum.  IMPRESSION: Endotracheal tube and right central venous catheters are unchanged in  position.  Enteric tube was removed.  Cardiac enlargement.  No developing infiltrates.   Original Report Authenticated By: Lucienne Capers, M.D.    ASSESSMENT / PLAN:  Recent Labs Lab 05/18/12 1309  05/18/12 2256 05/19/12 0055 05/20/12 0405 05/21/12 0522 05/22/12 0347  PHART 7.292*  --   --   --  7.357 7.353 7.425  PCO2ART 38.4  --   --   --  33.3* 32.7* 30.5*  PO2ART 109.0*  --   --   --  132.0* 75.8* 102.0*  HCO3 18.5*  --   --   --  18.5* 17.8* 20.0  TCO2 20  < > 20 21 19.6 18.8 21  O2SAT 98.0  --   --   --  99.4 99.3 98.0  < > = values in this interval not displayed.  PULMONARY A: Acute respiratory failure s/p cardiac arrest Diffuse pulmonary infiltrates: not clear if this is d/t edema or aspiration. No report of vomiting.  P:   - Extubated, patient doing well post extubation.  - D/C sedation protocol, PRN fentanyl. - See ID section. - Will need further diureses.  CARDIOVASCULAR A:  VF arrest Afib  ICM w/ EF 35% Cardiogenic shock Severe AS and mitral valve disease.  Plan: - KVO IVF. - Ok to cardiovert intermittently while being cooled per discussion with cards and and family.  If continuous shocks are needed will not continue that.  RENAL  Recent Labs Lab 05/19/12 0422  05/19/12 2357 05/20/12 0315 05/20/12 0757 05/21/12 0549 05/21/12 1639 05/22/12 0300 05/23/12 0415  NA 137  < > 134* 133* 130* 136 135 135 142  K 4.4  < > 4.1 4.7 5.2* 4.2 4.0 3.5 3.6  CL 106  < > 104 102 101 104 103 103 108  CO2 18*  < > 18* 19 18* 18* 21 21 24   GLUCOSE 141*  < > 118* 123* 191* 114* 89 89 152*  BUN 25*  < > 29* 30* 30* 33* 31* 29* 32*  CREATININE 1.08  < > 1.10 1.11* 1.17* 1.42* 1.46* 1.32* 1.16*  CALCIUM 9.0  < > 8.7 8.7 8.5 8.4 8.2* 8.1* 8.2*  MG 2.2  --   --  1.8  --  2.2  --  1.8 2.1  PHOS  --   --   --  2.1*  --  4.3  --  3.3 3.1  < > = values in this interval not displayed.  A:  Chronic renal insufficiency +anion gap metabolic acidosis - 123456: Mild  hypomagnesemia P:   - F/u BMET - Replace electrolytes as needed. - Lasix x2 doses. - K replacement IV.  GASTROINTESTINAL  Recent Labs Lab 05/18/12 1100  05/18/12 2042 05/19/12 0422 05/20/12 0315 05/21/12 0549 05/22/12 0500  AST 210*  --   --  180* 82* 309*  --   ALT 149*  --   --  150* 126* 286*  --   ALKPHOS 105  --   --  101 113 99  --   BILITOT 0.2*  --   --  0.3 0.2* 0.3  --   PROT 6.0  --   --  5.8* 5.9* 5.5*  --   ALBUMIN 2.5*  --   --  2.7* 2.7* 2.4*  --   INR 5.05*  < > 4.70* 4.73* 7.09* 1.66* 1.39  < > = values in this interval not displayed.  A:   GERD Elevated LFTs: likely shock liver.  Lipase elevated - 05/22/2012: Continues to be n.p.o. P:   - PPI - D/C TF. - F/U LFT in AM. - Swallow evaluation.  HEMATOLOGIC  Recent Labs Lab 05/21/12 0549 05/22/12 0300 05/23/12 0415  HGB 7.7* 7.4* 7.8*  HCT 23.5* 22.3* 24.1*  WBC 17.5* 15.2* 15.6*  PLT 378 296 335    Recent Labs Lab 05/18/12 2042 05/19/12 0422 05/20/12 0315 05/21/12 0549 05/22/12 0500  INR 4.70* 4.73* 7.09* 1.66* 1.39   A:   Chronic anemia Warfarin induced coagulopathy [artificial heart valve] - 05/22/2012: INR has normalized. Left upper extremity is swollen but she is a heparin drip P:  - Heparin drip per pharmacy - Goal INR 2-2.5, now 4.7 but patient is being cooled, will not reverse further given artificial valve and the fact that patient is not bleeding. - Duplex of the left upper extremity positive for DVT, continue heparin drip. - Will need to restart coumadin when patient becomes more stable.  INFECTIOUS  Recent Labs Lab 05/18/12 1250 05/19/12 0421 05/19/12 0422 05/20/12 0315  LATICACIDVEN 7.87* 2.6*  --   --   PROCALCITON <0.10  --  4.74 3.59   A:  Possible aspiration  - 05/22/2012: Pro-calcitonin says is localized infection such as pneumonia P:   - Sputum culture negative. - Empiric unasyn, will finish an 8 day course.  ENDOCRINE A:  DM P:   - SSI protocol    NEUROLOGIC A:  Acute encephalopathy 05/22/2012: Following commands but appears deconditioned and lethargic. P:   - Hypothermia protocol complete. - PRN fentanyl for pain and d/c sedation protocol.  I have personally obtained a history, examined the patient, evaluated laboratory and imaging results, formulated the assessment and plan and placed orders.  CRITICAL CARE: The patient is critically ill with multiple organ systems failure and requires high complexity decision making for assessment and support, frequent evaluation and titration of therapies, application of advanced monitoring technologies and extensive interpretation of multiple databases. Critical Care Time devoted to patient care services described in this note is 35 minutes.   Rush Farmer, M.D. [facility] Pulmonary/Critical Care Medicine. Pager: [phone]. After hours pager: [phone].

## 2012-05-23 NOTE — Progress Notes (Signed)
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 K 4.4. Mg 2.7. [name] is intubated, sedated and on cooling per protocol. Her history is obtained from the chart. According to her H&P, her husband drove her to [facility] ED and there she required prolonged resuscitation, with multiple drugs and shocks, but pulses returned in about 45 minutes. She was transferred to [facility] ED where she was intubated and sedated. Her initial QT was difficult to assess however, with cooling, she did have marked QT prolongation with recurrent VT/VF.  Remains intubated. Pressors weaned yesterday. Following simple commands.   OBJECTIVE  Filed Vitals:   05/23/12 0400 05/23/12 0500 05/23/12 0515 05/23/12 0600  BP: 136/51 126/68 126/68 105/44  Pulse: 98 42 102 81  Temp:      TempSrc:      Resp: 23 18 18 18   Height:      Weight: 83.2 kg (183 lb 6.8 oz)     SpO2: 100% 100% 100% 100%    Intake/Output Summary (Last 24 hours) at 05/23/12 0634 Last data filed at 05/23/12 0600  Gross per 24 hour    Intake 2397.39 ml  Output   2145 ml  Net 252.39 ml   Weight change: -0.3 kg (-10.6 oz)  PHYSICAL EXAM  General: Intubated, follows simple commands.  Head: Normocephalic, atraumatic, sclera non-icteric, no xanthomas, nares are without discharge. Neck: Supple without bruits or JVP 6-7 cm Lungs: Ventilator-assisted respirations, upper respiratory stridor Heart: RRR, occasional irregularity, II/VI systolic murmur at RUSB, no s3, s4 Abdomen: Mildly distended, non-tender, BS + x 4.  Msk:  Tone appears normal for age. Strength unable to be assessed.  Extremities: No clubbing, cyanosis or edema. DP/PT/Radials 2+ and equal bilaterally. Neuro/psych: Unable to be assessed. Intubated, follows simple commands.  Psych: Normal affect.  LABS:  Recent Labs     05/22/12  0300  05/23/12  0415  WBC  15.2*  15.6*  HGB  7.4*  7.8*  HCT  22.3*  24.1*  MCV  78.8  81.4  PLT  296  335   Recent Labs Lab 05/21/12 0549 05/21/12 1639 05/22/12 0300 05/23/12 0415  NA 136 135 135 142  K 4.2 4.0 3.5 3.6  CL 104 103 103 108  CO2 18* 21 21 24   BUN 33* 31* 29* 32*  CREATININE 1.42* 1.46* 1.32* 1.16*  CALCIUM 8.4 8.2* 8.1* 8.2*  PROT 5.5*  --   --   --   BILITOT 0.3  --   --   --   ALKPHOS 99  --   --   --   ALT 286*  --   --   --  AST 309*  --   --   --   LIPASE 238*  --   --   --   GLUCOSE 114* 89 89 152*   Recent Labs     05/20/12  0917  05/20/12  1517  TROPONINI  0.69*  0.65*    TELE: NSR, intermittent a-fib, PACs, PVCs   Radiology/Studies:  Ct Head Wo Contrast  05/18/2012  *RADIOLOGY REPORT*  Clinical Data: Unresponsive.  Prolonged resuscitation.  Evaluate for hemorrhage.  CT HEAD WITHOUT CONTRAST  Technique:  Contiguous axial images were obtained from the base of the skull through the vertex without contrast.  Comparison: MR brain 06/24/2006 and CT head 06/24/2006.  Findings: No evidence of acute infarct, acute hemorrhage, mass lesion, mass effect or hydrocephalus.  Atrophy.  There  is mild periventricular low attenuation.  Tiny remote left basal ganglia lacunar infarct.  Scattered mucosal disease in the paranasal sinuses.  IMPRESSION:  1.  No acute intracranial abnormality. 2.  Atrophy and mild chronic microvascular white matter ischemic changes.   Original Report Authenticated By: Lorin Picket, M.D.    Dg Chest Port 1 View  05/22/2012  *RADIOLOGY REPORT*  Clinical Data: Endotracheal tube position.  PORTABLE CHEST - 1 VIEW  Comparison: 05/21/2012  Findings: Endotracheal tube in place with tip about 5.2 cm above the carina.  Enteric tube has been removed.  Right central venous catheter is unchanged in position.  Cardiac enlargement with normal pulmonary vascularity.  No focal consolidation or airspace disease in the lungs.  No pneumothorax.  No blunting of costophrenic angles.  Stable appearance of postoperative changes in the mediastinum.  IMPRESSION: Endotracheal tube and right central venous catheters are unchanged in position.  Enteric tube was removed.  Cardiac enlargement.  No developing infiltrates.   Original Report Authenticated By: Lucienne Capers, M.D.    Dg Chest Port 1 View  05/21/2012  *RADIOLOGY REPORT*  Clinical Data: The atrial fibrillation.  Ischemic cardiomyopathy. Acute respiratory failure.  Endotracheal tube placement.  PORTABLE CHEST - 1 VIEW  Comparison: 05/20/2012  Findings: Endotracheal tube remains in appropriate position with the tip approximately 4.5 cm above the carina. The nasogastric has been pulled back with the tip now in the mid thoracic esophagus. Right jugular center venous catheter remains in appropriate position.  Cardiomegaly is stable.  Mild perihilar interstitial edema pattern shows no significant change.  A small bilateral pleural effusions are again noted with left retrocardiac atelectasis.  These findings show no significant change.  IMPRESSION:  1. Nasogastric tube been pulled back with the tip now in the mid thoracic esophagus. 2.   Endotracheal tube in appropriate position. 3.  Stable cardiomegaly and mild interstitial edema. 4.  No significant change in bilateral pleural effusions and left retrocardiac atelectasis.   Original Report Authenticated By: Earle Gell, M.D.    Dg Chest Port 1 View  05/20/2012  *RADIOLOGY REPORT*  Clinical Data: Endotracheal tube placement  PORTABLE CHEST - 1 VIEW  Comparison: 05/18/2012  Findings: Endotracheal tube and right internal jugular central venous catheter are stable.  NG tube placed.  The tip is in the distal esophagus.  Patchy bilateral airspace opacities have improved.  No pneumothorax.  Stable vascular congestion.  IMPRESSION: NG tube placed.  Tip is in the distal esophagus.  Improved bilateral airspace opacities.   Original Report Authenticated By: Marybelle Killings, M.D.    Dg Chest Port 1 View  05/18/2012  *RADIOLOGY REPORT*  Clinical Data:  Intubation, arrest.  PORTABLE CHEST - 1 VIEW  Comparison: 02/21/2012  Findings: Endotracheal tube is 4-5 cm above the carina.  Right central line tip in the SVC.  No pneumothorax.  Diffuse bilateral airspace disease with cardiomegaly.  No effusions.  No acute bony abnormality.  IMPRESSION: Support devices in expected position as above. No pneumothorax.  Diffuse bilateral airspace disease.   Original Report Authenticated By: Rolm Baptise, M.D.    Dg Abd Portable 1v  05/20/2012  *RADIOLOGY REPORT*  Clinical Data: Vomiting  PORTABLE ABDOMEN - 1 VIEW  Comparison: 03/13/2012  Findings: The tip of the NG tube is at the gastroesophageal junction.  Mild gastric distention with gas.  No disproportionate dilatation of small bowel.  Limited study by technique.  No obvious free intraperitoneal gas.  IMPRESSION: The NG tube tip at the gastroesophageal junction.  No evidence of small bowel obstruction.   Original Report Authenticated By: Marybelle Killings, M.D.     Current Medications:  . ampicillin-sulbactam (UNASYN) IV  1.5 g Intravenous Q6H  . antiseptic oral rinse  15 mL  Mouth Rinse Q4H  . chlorhexidine  15 mL Mouth/Throat BID  . feeding supplement (OSMOLITE 1.2 CAL)  1,000 mL Per Tube Q24H  . feeding supplement  30 mL Per Tube QID  . insulin aspart  2-6 Units Subcutaneous Q4H  . insulin glargine  10 Units Subcutaneous Q24H  . pantoprazole (PROTONIX) IV  40 mg Intravenous Q24H  . potassium chloride  20 mEq Per Tube Daily  . THROMBI-PAD  1 each Topical Once  . Warfarin - Pharmacist Dosing Inpatient   Does not apply q1800    ASSESSMENT AND PLAN:  1. VF arrest  2. Prolonged QT, Tikosyn d/c'd  3. PAF  4. LV dysfunction  5. CAD  6. Valvular heart disease s/p bioprosthetic AVR and MV repair 2008  7. Anemia  8. Superficial venous thrombosis, LUE    LUE duplex confirms SVT of the left cephalic and basilic veins. Currently on heparin gtt. Coumadin started. No significant erythema or warmth. No IV in that arm. Leukocytosis is trending down. On Unasyn to cover for PNA. Elevate LUE. Continue to monitor. Off pressors, BP stable this AM. Check EKG today to monitor QT. K 3.6 today, will replete to > 4.0. Mg 2.1. Extubation possibly today. Continue amio gtt for intermittent a-fib. ICD this week or early next week per EP.     Signed, R. Valeria Batman, PA-C 05/23/2012, 6:34 AM As above, patient seen and examined; improving neurological status; continue heparin/coumadin for DVT/PAF. For ICD prior to DC. [name] 8:27 AM

## 2012-05-23 NOTE — Progress Notes (Signed)
ANTICOAGULATION CONSULT NOTE - Follow Up Consult  Pharmacy Consult for heparin Indication: atrial fibrillation and AVR + MV repair  Labs:  Recent Labs  05/20/12 0315 05/20/12 0317  05/20/12 0917 05/20/12 1517 05/21/12 0549 05/21/12 1639 05/22/12 0300 05/22/12 0500 05/22/12 1230 05/22/12 2240  HGB 9.2*  --   --   --   --  7.7*  --  7.4*  --   --   --   HCT 28.9*  --   --   --   --  23.5*  --  22.3*  --   --   --   PLT 498*  --   --   --   --  378  --  296  --   --   --   APTT  --   --   --   --   --   --   --   --  30  --   --   LABPROT 55.9*  --   --   --   --  19.1*  --   --  16.7*  --   --   INR 7.09*  --   --   --   --  1.66*  --   --  1.39  --   --   HEPARINUNFRC  --   --   --   --   --   --   --   --   --  0.12* 0.14*  CREATININE 1.11*  --   < >  --   --  1.42* 1.46* 1.32*  --   --   --   TROPONINI  --  0.85*  --  0.69* 0.65*  --   --   --   --   --   --   < > = values in this interval not displayed.   Assessment: 72yo female remains subtherapeutic on heparin with very little change in level despite rate increase.  Goal of Therapy:  Heparin level 0.3-0.7 units/ml   Plan:  Will increase heparin gtt by 3-4 units/kg/hr to 1600 units/hr and check level in [place], PharmD, BCPS  05/23/2012,12:02 AM

## 2012-05-23 NOTE — Progress Notes (Signed)
[place] for heparin  Indication: atrial fibrillation and AVR + MV repair  No Known Allergies  Patient Measurements: Height: 5\' 6"  (167.6 cm) Weight: 183 lb 6.8 oz (83.2 kg) IBW/kg (Calculated) : 59.3 Heparin Dosing Weight: 80kg  Vital Signs: Temp: 98 F (36.7 C) (03/18 1600) Temp src: Oral (03/18 1600) BP: 121/67 mmHg (03/18 1600) Pulse Rate: 117 (03/18 1600)  Labs:  Recent Labs  05/21/12 0549 05/21/12 1639 05/22/12 0300 05/22/12 0500  05/22/12 2240 05/23/12 0415 05/23/12 0800 05/23/12 1600  HGB 7.7*  --  7.4*  --   --   --  7.8*  --   --   HCT 23.5*  --  22.3*  --   --   --  24.1*  --   --   PLT 378  --  296  --   --   --  335  --   --   APTT  --   --   --  30  --   --   --  153*  --   LABPROT 19.1*  --   --  16.7*  --   --   --  18.1*  --   INR 1.66*  --   --  1.39  --   --   --  1.55*  --   HEPARINUNFRC  --   --   --   --   < > 0.14*  --  0.62 0.56  CREATININE 1.42* 1.46* 1.32*  --   --   --  1.16*  --   --   < > = values in this interval not displayed. Estimated Creatinine Clearance: 48.4 ml/min (by C-G formula based on Cr of 1.16).    Infusions:  . sodium chloride 20 mL/hr at 05/20/12 1900  . amiodarone (NEXTERONE PREMIX) 360 mg/200 mL dextrose 30 mg/hr (05/23/12 0930)  . heparin 1,600 Units/hr (05/23/12 1513)  . isoproterenol (ISUPREL) infusion Stopped (05/20/12 0257)  . phenylephrine (NEO-SYNEPHRINE) Adult infusion Stopped (05/23/12 0459)  . [DISCONTINUED] fentaNYL infusion INTRAVENOUS 10 mcg/hr (05/22/12 1945)  . [DISCONTINUED] fentaNYL infusion INTRAVENOUS 25 mcg/hr (05/23/12 0551)    Assessment: 72yo female admitted s/p VF arrest s/p artic sun. Patient on coumadin PTA for afib on now on heparin. Patient for ICD later this week or early next week. HL today= 0.62 and INR today= 1.55. Confirmation heparin level 0.56 remain in goal range.  Goal of Therapy:  Heparin level 0.3-0.7 units/ml INR 2.5-3.5 Monitor  platelets by anticoagulation protocol: Yes   Plan:  -Continue heparin at 1600 units/hr Next HL in am.  Hazle Ogburn S. Alford Highland, PharmD, [facility] Clinical Staff Pharmacist Pager [phone]   05/23/2012 5:14 PM

## 2012-05-23 NOTE — Progress Notes (Signed)
Pt having increased respirations, heart rate ST notified E-Link no orders given. Will continue with IS and flutter valve

## 2012-05-23 NOTE — Progress Notes (Signed)
[place] for heparin and Coumadin Indication: atrial fibrillation and AVR + MV repair  No Known Allergies  Patient Measurements: Height: 5\' 6"  (167.6 cm) Weight: 183 lb 6.8 oz (83.2 kg) IBW/kg (Calculated) : 59.3 Heparin Dosing Weight: 80kg  Vital Signs: Temp: 98.8 F (37.1 C) (03/18 0730) Temp src: Oral (03/18 0730) BP: 112/49 mmHg (03/18 0800) Pulse Rate: 89 (03/18 0800)  Labs:  Recent Labs  05/20/12 1517  05/21/12 0549 05/21/12 1639 05/22/12 0300 05/22/12 0500 05/22/12 1230 05/22/12 2240 05/23/12 0415 05/23/12 0800  HGB  --   < > 7.7*  --  7.4*  --   --   --  7.8*  --   HCT  --   --  23.5*  --  22.3*  --   --   --  24.1*  --   PLT  --   --  378  --  296  --   --   --  335  --   APTT  --   --   --   --   --  30  --   --   --   --   LABPROT  --   --  19.1*  --   --  16.7*  --   --   --  18.1*  INR  --   --  1.66*  --   --  1.39  --   --   --  1.55*  HEPARINUNFRC  --   --   --   --   --   --  0.12* 0.14*  --  0.62  CREATININE  --   < > 1.42* 1.46* 1.32*  --   --   --  1.16*  --   TROPONINI 0.65*  --   --   --   --   --   --   --   --   --   < > = values in this interval not displayed. Estimated Creatinine Clearance: 48.4 ml/min (by C-G formula based on Cr of 1.16).    Infusions:  . sodium chloride 20 mL/hr at 05/20/12 1900  . amiodarone (NEXTERONE PREMIX) 360 mg/200 mL dextrose 30 mg/hr (05/23/12 0930)  . heparin 1,600 Units/hr (05/23/12 0005)  . isoproterenol (ISUPREL) infusion Stopped (05/20/12 0257)  . phenylephrine (NEO-SYNEPHRINE) Adult infusion Stopped (05/23/12 0459)  . [DISCONTINUED] fentaNYL infusion INTRAVENOUS 10 mcg/hr (05/22/12 1945)  . [DISCONTINUED] fentaNYL infusion INTRAVENOUS 25 mcg/hr (05/23/12 0551)  . [DISCONTINUED] heparin 1,100 Units/hr (05/22/12 0522)    Assessment: 72yo female admitted s/p VF arrest noted s/p artic sun. Patient on coumadin PTA for afib on now on heparin. Patient for ICD late  this week or early next week. HL today= 0.62 and INR today= 1.55. LFTs  Noted 309/286 on 3/16 with INR= 7.02 on 3/15 s/p vitamin K.  Home coumadin dose: 2mg  MWF, 4mg  TTSS  Goal of Therapy:  Heparin level 0.3-0.7 units/ml INR 2.5-3.5 Monitor platelets by anticoagulation protocol: Yes   Plan:  -Continue heparin at 1600 units/hr -Heparin level in 8hrs -Coumadin 2mg  for now (due to INR= 7.09) and increase slowly  Hildred Laser, Pharm D 05/23/2012 10:56 AM

## 2012-05-23 NOTE — Procedures (Signed)
Extubation Procedure Note  Patient Details:   Name: [name] DOB: 06/01/1940 MRN: [id number]   Airway Documentation:     Evaluation  O2 sats: currently acceptable Complications: No apparent complications Patient did tolerate procedure well. Bilateral Breath Sounds: Rhonchi Suctioning: Airway Yes Pt self extubated, Pt placed on 4lpm [place], Sp02 100%, hr 118, 18, 112/49. Pt is able to vocalize her name & has mod cough.   Cordella Register 05/23/2012, 8:13 AM

## 2012-05-23 NOTE — Progress Notes (Signed)
SLP Cancellation Note  Patient Details Name: [name] MRN: [id number] DOB: Sep 18, 1940   Cancelled treatment:       Reason Eval/Treat Not Completed: Patient not medically ready. Pt self extubated this am after 7 day intubation. Pt would likely benefit from 24 hours prior to initiating PO and currently has an alternate source of nutrition. SLP will f/u in am for swallow eval.   Herbie [place], MA CCC-SLP [id number]  [name] 05/23/2012, 2:45 PM

## 2012-05-23 NOTE — Progress Notes (Signed)
Chart review complete.  Patient remains intubated.  Noted to be recently readmitted.  Spoke with Elissa Hefty RNCM regarding patients status and appropriateness for service.  Agreed to continue to monitor her progress to determine when to engage her care.  Of note, [facility] Care Management services does not replace or interfere with any services that are arranged by inpatient case management or social work.  For additional questions or referrals please contact Corliss Blacker BSN RN [facility] Liaison at [phone].

## 2012-05-24 DIAGNOSIS — J9600 Acute respiratory failure, unspecified whether with hypoxia or hypercapnia: Secondary | ICD-10-CM

## 2012-05-24 DIAGNOSIS — R5381 Other malaise: Secondary | ICD-10-CM

## 2012-05-24 LAB — CBC
MCH: 25.7 pg — ABNORMAL LOW (ref 26.0–34.0)
MCHC: 31.6 g/dL (ref 30.0–36.0)
Platelets: 329 10*3/uL (ref 150–400)
RBC: 2.88 MIL/uL — ABNORMAL LOW (ref 3.87–5.11)
RDW: 15.5 % (ref 11.5–15.5)

## 2012-05-24 LAB — BASIC METABOLIC PANEL
Calcium: 8.4 mg/dL (ref 8.4–10.5)
GFR calc non Af Amer: 51 mL/min — ABNORMAL LOW (ref >90)
Sodium: 142 mEq/L (ref 135–145)

## 2012-05-24 LAB — HEPATIC FUNCTION PANEL
Alkaline Phosphatase: 113 U/L (ref 39–117)
Bilirubin, Direct: 0.2 mg/dL (ref 0.0–0.3)
Indirect Bilirubin: 0.2 mg/dL — ABNORMAL LOW (ref 0.3–0.9)
Total Bilirubin: 0.4 mg/dL (ref 0.3–1.2)

## 2012-05-24 LAB — MAGNESIUM: Magnesium: 1.7 mg/dL (ref 1.5–2.5)

## 2012-05-24 LAB — HEPARIN LEVEL (UNFRACTIONATED): Heparin Unfractionated: 0.61 IU/mL (ref 0.30–0.70)

## 2012-05-24 LAB — GLUCOSE, CAPILLARY: Glucose-Capillary: 148 mg/dL — ABNORMAL HIGH (ref 70–99)

## 2012-05-24 LAB — PROTIME-INR: Prothrombin Time: 18.7 seconds — ABNORMAL HIGH (ref 11.6–15.2)

## 2012-05-24 MED ORDER — FUROSEMIDE 10 MG/ML IJ SOLN
40.0000 mg | Freq: Three times a day (TID) | INTRAMUSCULAR | Status: AC
  Administered 2012-05-24 (×2): 40 mg via INTRAVENOUS
  Filled 2012-05-24 (×2): qty 4

## 2012-05-24 MED ORDER — CARVEDILOL 3.125 MG PO TABS
3.1250 mg | ORAL_TABLET | Freq: Two times a day (BID) | ORAL | Status: DC
  Administered 2012-05-24 (×2): 3.125 mg via ORAL
  Filled 2012-05-24 (×5): qty 1

## 2012-05-24 MED ORDER — WARFARIN SODIUM 2 MG PO TABS
2.0000 mg | ORAL_TABLET | Freq: Once | ORAL | Status: AC
  Administered 2012-05-24: 2 mg via ORAL
  Filled 2012-05-24: qty 1

## 2012-05-24 MED ORDER — MAGNESIUM SULFATE 40 MG/ML IJ SOLN
2.0000 g | Freq: Once | INTRAMUSCULAR | Status: AC
  Administered 2012-05-24: 2 g via INTRAVENOUS
  Filled 2012-05-24: qty 50

## 2012-05-24 MED ORDER — POTASSIUM CHLORIDE 20 MEQ/15ML (10%) PO LIQD
40.0000 meq | Freq: Three times a day (TID) | ORAL | Status: AC
  Administered 2012-05-24: 40 meq
  Filled 2012-05-24 (×2): qty 30

## 2012-05-24 NOTE — Evaluation (Signed)
Clinical/Bedside Swallow Evaluation Patient Details  Name: [name] MRN: [id number] Date of Birth: 02-Nov-1940  Today's Date: 05/24/2012 Time: [id number] SLP Time Calculation (min): 20 min  Past Medical History:  Past Medical History  Diagnosis Date  . Anemia   . Atrial fibrillation     A.fib/flutter: s/p MAZE 2008, DCCV 2011, on coumadin; previously on flecainide, but dc'd due to worsening EF  . Diverticulitis   . Valvular heart disease     bicuspid AV/severe AS, mitral regurgitation, & aneurysm of the ascending thoracic aorta (s/p AV replacement, MV repair, resection and grafting of the ascending thoracic aortic aneurysm 2008  . CVA (cerebral vascular accident)     2008 - felt due to oscillating calcium on aortic valve  . Carcinoma in situ of vulva   . Fibroid   . Ovarian cyst, left 2008-2009  . Osteoporosis   . Diabetes mellitus   . Hypertension   . GERD (gastroesophageal reflux disease)   . Chronic systolic CHF (congestive heart failure)     EF 35%  . Ischemic cardiomyopathy     EF 35%  . Coronary artery disease     Occluded LM 2/2 previous aortic root surgery; s/p 2v CABG 2010 LIMA to LAD, SVG to OM; Cath 03/11/12 patent SVG to OM, atretic LIMA to LAD, patent RCA, occluded native LM, EF 35%   . Shortness of breath   . Arthritis     OSTEO  . Fracture of lumbar spine    Past Surgical History:  Past Surgical History  Procedure Laterality Date  . Foot surgery      removed neuroma  . Aortic root replacement  2008    homograft  . Mv repair  2008    due to severe MR  . Resection and grafting of ascending thoracic aortic    . Aneurysm and left sided maze  2008  . Coronary artery bypass graft  4/10    LIMA to LAD, SVG to OM  . Wide excision of vulva      CA INSITU   HPI:  72 yr old with sudden cardiac arrest, team estimates 45 minutes of ACLS to ROSC, intubated 3/12-3/18.  PMH:  ICM, AF, severe AS and MR, CVA, DM, HTN, GERD.  CXR 3/17 pulmonary vascular congestion and  pleura effusion.  Currently has NGT.   Assessment / Plan / Recommendation Clinical Impression  Pt. alert, in chair with moderate verbal cues required during swallow assessment with large bore NGT in place.  She exhibited pharyngeal s/s aspiration including immediate and delayed throat clears with thin and nectar liquid consistencies.  Pt. may have pharyngeal edema from intubation x 7 days, generalized weakness and presence of large bore NGT preventing adequate tracheal protection during pharyngeal phase of swallow.  Belching post majority of swallows as pt. has history of GERD.  SLP recommends continued NPO status with NGT until SLP returns next date with reassessment at bedside and recommendation of objective assessment if needed to initiate po's.  Recommend orders for a cognitive assessment to determine needs for cognitive intervention in recovery process.    Aspiration Risk  Moderate    Diet Recommendation NPO   Medication Administration: Via alternative means    Other  Recommendations Oral Care Recommendations: Oral care BID   Follow Up Recommendations  Inpatient Rehab    Frequency and Duration min 2x/week  2 weeks   Pertinent Vitals/Pain none    SLP Swallow Goals Goal #3: Pt. will exhibit functional oropharyngeal phases  of swallow during SLP provided trial at bedside for recommendation of po's verus objective assessment with min verbal cues.    Swallow Study Prior Functional Status  Type of Home: House Lives With: Spouse Available Help at Discharge: Family;Available 24 hours/day Vocation: Retired       Oral/Motor/Sensory Function Overall Oral Motor/Sensory Function: Appears within functional limits for tasks assessed   [employer] chips: Within functional limits Presentation: Spoon   Thin Liquid Thin Liquid: Impaired Presentation: Cup;Straw Pharyngeal  Phase Impairments: Throat Clearing - Immediate;Cough - Delayed    Nectar Thick Nectar Thick Liquid:  Impaired Presentation: Cup Pharyngeal Phase Impairments: Throat Clearing - Immediate;Throat Clearing - Delayed   Honey Thick Honey Thick Liquid: Not tested   Puree Puree: Impaired Pharyngeal Phase Impairments:  (gag x 1)   Solid       Solid: Impaired Oral Phase Functional Implications:  (mild delayed transit)       Houston Siren M.Ed [employer] [phone]  05/24/2012

## 2012-05-24 NOTE — Progress Notes (Signed)
Speech Language Pathology  Patient Details Name: [name] MRN: [id number] DOB: 11/27/1940 Today's Date: 05/24/2012 Time:  -    Bedside swallow assessment completed.  Full report to be documented.  Exhibited mild s/s aspiration with thin and thick liquids. Recommend: Continue NPO with tube feedings and ST will reassess tomorrow at bedside and recommend objective evaluation if needed.  Prognosis good for likely po recommendation next date.  [name] Gardiner.Ed [employer] [phone]  05/24/2012

## 2012-05-24 NOTE — Progress Notes (Signed)
Met with patient at bedside to talk about possible inpatient rehab admission. Pt would benefit from inpatient rehab when medically stable. Pt's family not present, so visit was brief. Rehab brochures left in room and will follow-up when pt's family is present.  For questions, call [phone].

## 2012-05-24 NOTE — Progress Notes (Signed)
NUTRITION FOLLOW UP  Intervention:   1. Continue with current TF orders.   2. RD will continue to follow    Nutrition Dx:   Inadequate oral intake related to inability to eat as evidenced by NPO status.  Ongoing   Goal:   Meet >/=90% estimated nutrition needs. Met   Monitor:   Diet advance, weight trends, labs  Assessment:   Pt self extubated on 3/18. Seen by SLP who recommended NPO with continued TF for nutrition at this time. Planned to re-evaluate swallow tomorrow.  Continues with pulmonary edema.   Patient has NG in place with tip of tube in stomach. Osmolite 1.2 is infusing @ 40 ml/hr. 30 ml Prostat via tube QID. Tube feeding regimen currently providing 1552 kcal, 113 grams protein, and 787 ml H2O.     Height: Ht Readings from Last 1 Encounters:  05/18/12 5\' 6"  (1.676 m)    Weight Status:   Wt Readings from Last 1 Encounters:  05/24/12 185 lb 10 oz (84.2 kg)    Re-estimated needs:  Kcal: 1650-1850 Protein: 68-75 gm Fluid: 1.2 L  Skin: intact   Diet Order:   NPO   Intake/Output Summary (Last 24 hours) at 05/24/12 1203 Last data filed at 05/24/12 1200  Gross per 24 hour  Intake 2647.5 ml  Output   3450 ml  Net -802.5 ml    Last BM: PTA   Labs:   Recent Labs Lab 05/22/12 0300 05/23/12 0415 05/24/12 0500  NA 135 142 142  K 3.5 3.6 3.8  CL 103 108 105  CO2 21 24 28   BUN 29* 32* 35*  CREATININE 1.32* 1.16* 1.07  CALCIUM 8.1* 8.2* 8.4  MG 1.8 2.1 1.7  PHOS 3.3 3.1 3.9  GLUCOSE 89 152* 173*    CBG (last 3)   Recent Labs  05/24/12 0340 05/24/12 0742 05/24/12 1145  GLUCAP 142* 172* 154*    Scheduled Meds: . ampicillin-sulbactam (UNASYN) IV  1.5 g Intravenous Q6H  . antiseptic oral rinse  15 mL Mouth Rinse Q4H  . carvedilol  3.125 mg Oral BID WC  . feeding supplement (OSMOLITE 1.2 CAL)  1,000 mL Per Tube Q24H  . feeding supplement  30 mL Per Tube QID  . furosemide  40 mg Intravenous Q8H  . insulin aspart  2-6 Units Subcutaneous Q4H   . insulin glargine  10 Units Subcutaneous Q24H  . magnesium sulfate 1 - 4 g bolus IVPB  2 g Intravenous Once  . pantoprazole (PROTONIX) IV  40 mg Intravenous Q24H  . potassium chloride  40 mEq Oral Daily  . potassium chloride  40 mEq Per Tube TID  . THROMBI-PAD  1 each Topical Once  . Warfarin - Pharmacist Dosing Inpatient   Does not apply q1800    Continuous Infusions: . sodium chloride 20 mL/hr at 05/24/12 0600  . amiodarone (NEXTERONE PREMIX) 360 mg/200 mL dextrose 30 mg/hr (05/24/12 1112)  . heparin 1,600 Units/hr (05/24/12 0600)  . isoproterenol (ISUPREL) infusion Stopped (05/20/12 0257)  . phenylephrine (NEO-SYNEPHRINE) Adult infusion Stopped (05/23/12 0459)    Orson Slick RD, LDN Pager [phone] After Hours pager [phone]

## 2012-05-24 NOTE — Progress Notes (Signed)
Patient Name: [name] Date of Encounter: 05/24/2012     Principal Problem:   Sudden cardiac arrest Active Problems:   Atrial fibrillation   Mitral valve disease   Type II or unspecified type diabetes mellitus without mention of complication, uncontrolled   GERD (gastroesophageal reflux disease)   Ischemic cardiomyopathy   Valvular heart disease-bicuspid aortic valve s/p Mechanical replacement//MV repair   Ventricular fibrillation   Acute respiratory failure   Warfarin-induced coagulopathy   Acute encephalopathy   Metabolic acidosis   Sustained ventricular fibrillation    SUBJECTIVE:  [name] is a 72 year old woman with h/o severe AS, mitral regurgitation, aneurysm of the ascending thoracic aorta (status post aortic valve replacement, mitral valve repair, resection and grafting of the ascending thoracic aortic aneurysm on Jul 07, 2006), CAD s/p CABG, AF s/p recent Tikosyn initiation and LV dysfunction who presented yesterday after out-of-hospital VF arrest. K 4.4. Mg 2.7. [name] is intubated, sedated and on cooling per protocol. Her history is obtained from the chart. According to her H&P, her husband drove her to [facility] ED and there she required prolonged resuscitation, with multiple drugs and shocks, but pulses returned in about 45 minutes. She was transferred to [facility] ED where she was intubated and sedated. Her initial QT was difficult to assess however, with cooling, she did have marked QT prolongation with recurrent VT/VF.  Alert; answers questions; mild dyspnea; no chest pain  OBJECTIVE  Filed Vitals:   05/24/12 0400 05/24/12 0453 05/24/12 0505 05/24/12 0600  BP: 136/77  129/62 126/73  Pulse: 107  106 96  Temp:      TempSrc:      Resp:      Height:      Weight:  185 lb 10 oz (84.2 kg)    SpO2: 95%  87% 100%    Intake/Output Summary (Last 24 hours) at 05/24/12 0743 Last data filed at 05/24/12 0600  Gross per 24 hour  Intake 2469.8 ml  Output   3000 ml  Net  -530.2 ml   Weight change: 2 lb 3.3 oz (1 kg)  PHYSICAL EXAM  Head: Normocephalic, atraumatic Neck: Supple Lungs: Mild decreased BS Heart: Tachycardic, II/VI systolic murmur at RUSB Abdomen: Not distended, non-tender Extremities: No edema  LABS:  Recent Labs     05/23/12  0415  05/24/12  0500  WBC  15.6*  11.4*  HGB  7.8*  7.4*  HCT  24.1*  23.4*  MCV  81.4  81.3  PLT  335  329   Recent Labs Lab 05/21/12 0549  05/22/12 0300 05/23/12 0415 05/24/12 0500  NA 136  < > 135 142 142  K 4.2  < > 3.5 3.6 3.8  CL 104  < > 103 108 105  CO2 18*  < > 21 24 28   BUN 33*  < > 29* 32* 35*  CREATININE 1.42*  < > 1.32* 1.16* 1.07  CALCIUM 8.4  < > 8.1* 8.2* 8.4  PROT 5.5*  --   --   --  6.0  BILITOT 0.3  --   --   --  0.4  ALKPHOS 99  --   --   --  113  ALT 286*  --   --   --  209*  AST 309*  --   --   --  110*  LIPASE 238*  --   --   --   --   GLUCOSE 114*  < > 89  152* 173*  < > = values in this interval not displayed. No results found for this basename: CKTOTAL, CKMB, CKMBINDEX, TROPONINI,  in the last 72 hours    Radiology/Studies:  Ct Head Wo Contrast  05/18/2012  *RADIOLOGY REPORT*  Clinical Data: Unresponsive.  Prolonged resuscitation.  Evaluate for hemorrhage.  CT HEAD WITHOUT CONTRAST  Technique:  Contiguous axial images were obtained from the base of the skull through the vertex without contrast.  Comparison: MR brain 06/24/2006 and CT head 06/24/2006.  Findings: No evidence of acute infarct, acute hemorrhage, mass lesion, mass effect or hydrocephalus.  Atrophy.  There is mild periventricular low attenuation.  Tiny remote left basal ganglia lacunar infarct.  Scattered mucosal disease in the paranasal sinuses.  IMPRESSION:  1.  No acute intracranial abnormality. 2.  Atrophy and mild chronic microvascular white matter ischemic changes.   Original Report Authenticated By: Lorin Picket, M.D.    Dg Chest Port 1 View  05/22/2012  *RADIOLOGY REPORT*  Clinical Data:  Endotracheal tube position.  PORTABLE CHEST - 1 VIEW  Comparison: 05/21/2012  Findings: Endotracheal tube in place with tip about 5.2 cm above the carina.  Enteric tube has been removed.  Right central venous catheter is unchanged in position.  Cardiac enlargement with normal pulmonary vascularity.  No focal consolidation or airspace disease in the lungs.  No pneumothorax.  No blunting of costophrenic angles.  Stable appearance of postoperative changes in the mediastinum.  IMPRESSION: Endotracheal tube and right central venous catheters are unchanged in position.  Enteric tube was removed.  Cardiac enlargement.  No developing infiltrates.   Original Report Authenticated By: Lucienne Capers, M.D.    Dg Chest Port 1 View  05/21/2012  *RADIOLOGY REPORT*  Clinical Data: The atrial fibrillation.  Ischemic cardiomyopathy. Acute respiratory failure.  Endotracheal tube placement.  PORTABLE CHEST - 1 VIEW  Comparison: 05/20/2012  Findings: Endotracheal tube remains in appropriate position with the tip approximately 4.5 cm above the carina. The nasogastric has been pulled back with the tip now in the mid thoracic esophagus. Right jugular center venous catheter remains in appropriate position.  Cardiomegaly is stable.  Mild perihilar interstitial edema pattern shows no significant change.  A small bilateral pleural effusions are again noted with left retrocardiac atelectasis.  These findings show no significant change.  IMPRESSION:  1. Nasogastric tube been pulled back with the tip now in the mid thoracic esophagus. 2.  Endotracheal tube in appropriate position. 3.  Stable cardiomegaly and mild interstitial edema. 4.  No significant change in bilateral pleural effusions and left retrocardiac atelectasis.   Original Report Authenticated By: Earle Gell, M.D.    Dg Chest Port 1 View  05/20/2012  *RADIOLOGY REPORT*  Clinical Data: Endotracheal tube placement  PORTABLE CHEST - 1 VIEW  Comparison: 05/18/2012  Findings:  Endotracheal tube and right internal jugular central venous catheter are stable.  NG tube placed.  The tip is in the distal esophagus.  Patchy bilateral airspace opacities have improved.  No pneumothorax.  Stable vascular congestion.  IMPRESSION: NG tube placed.  Tip is in the distal esophagus.  Improved bilateral airspace opacities.   Original Report Authenticated By: Marybelle Killings, M.D.    Dg Chest Port 1 View  05/18/2012  *RADIOLOGY REPORT*  Clinical Data:  Intubation, arrest.  PORTABLE CHEST - 1 VIEW  Comparison: 02/21/2012  Findings: Endotracheal tube is 4-5 cm above the carina.  Right central line tip in the SVC.  No pneumothorax.  Diffuse bilateral airspace disease with cardiomegaly.  No effusions.  No acute bony abnormality.  IMPRESSION: Support devices in expected position as above. No pneumothorax.  Diffuse bilateral airspace disease.   Original Report Authenticated By: Rolm Baptise, M.D.    Dg Abd Portable 1v  05/20/2012  *RADIOLOGY REPORT*  Clinical Data: Vomiting  PORTABLE ABDOMEN - 1 VIEW  Comparison: 03/13/2012  Findings: The tip of the NG tube is at the gastroesophageal junction.  Mild gastric distention with gas.  No disproportionate dilatation of small bowel.  Limited study by technique.  No obvious free intraperitoneal gas.  IMPRESSION: The NG tube tip at the gastroesophageal junction.  No evidence of small bowel obstruction.   Original Report Authenticated By: Marybelle Killings, M.D.     Current Medications:  . ampicillin-sulbactam (UNASYN) IV  1.5 g Intravenous Q6H  . antiseptic oral rinse  15 mL Mouth Rinse Q4H  . chlorhexidine  15 mL Mouth/Throat BID  . feeding supplement (OSMOLITE 1.2 CAL)  1,000 mL Per Tube Q24H  . feeding supplement  30 mL Per Tube QID  . insulin aspart  2-6 Units Subcutaneous Q4H  . insulin glargine  10 Units Subcutaneous Q24H  . pantoprazole (PROTONIX) IV  40 mg Intravenous Q24H  . potassium chloride  40 mEq Oral Daily  . THROMBI-PAD  1 each Topical Once  .  Warfarin - Pharmacist Dosing Inpatient   Does not apply q1800    ASSESSMENT AND PLAN:  1. VF arrest  2. Prolonged QT, Tikosyn d/c'd  3. PAF  4. LV dysfunction  5. CAD  6. Valvular heart disease s/p bioprosthetic AVR and MV repair 2008  7. Anemia  8. Superficial venous thrombosis, LUE    Neurological status continues to improve. LUE duplex confirms DVT of the left cephalic and basilic veins. Currently on heparin gtt. Coumadin started. On Unasyn to cover for PNA. Elevate LUE. Continue to monitor. Off pressors, BP stable this AM. Continue diuresis and follow Mg and renal function/K. Add low dose coreg. Will add ARB later as BP and renal function stabilizes. Continue amio gtt for intermittent a-fib. ICD this week or early next week per EP.    [name] 7:43 AM

## 2012-05-24 NOTE — Progress Notes (Signed)
PULMONARY  / CRITICAL CARE MEDICINE  Name: [name] MRN: [id number] DOB: 1940-08-16    ADMISSION DATE:  05/18/2012 CONSULTATION DATE:  3/13  REFERRING MD :  Maryan Rued  CHIEF COMPLAINT:  Cardiac arrest   BRIEF PATIENT DESCRIPTION:  72 year old female w/ h/o ICM (EF 35%), AF, severe AS and MR. Recently started on Tikosyn. Was in usual state of health until 3/13 when she was riding in the car and became suddenly unresponsive. Her husband estimates 6 minutes driving to ER. On arrival found to be in VF arrest. Team estimates 45 minutes of ACLS to ROSC. PCCM asked to admit.   SIGNIFICANT EVENTS / STUDIES:  CT head 3/12>>> Echo 3/12>>>  LINES / TUBES: OETT 3/12>>> Right IJ CVL 3/12>>> Right rad aline 3/12>>>  CULTURES: Sputum 3/12>>>NTD MRS NTD  ANTIBIOTICS: Unasyn 3/12>>>  SUBJECTIVE:    05/22/2012: Morning rounds patient was on low dose of Neo-Synephrine. Continue sedation had just been turned off and her RASS sedation score was -2. Later in the day she came off pressors but was getting very agitated and was having copious respiratory secretions. Fentanyl sedation had to be turned back on  Has new onset left upper extremity edema  VITAL SIGNS: Temp:  [98 F (36.7 C)-99.1 F (37.3 C)] 98.2 F (36.8 C) (03/19 0800) Pulse Rate:  [96-122] 108 (03/19 0812) Resp:  [16-43] 21 (03/18 2200) BP: (104-143)/(51-97) 126/97 mmHg (03/19 0812) SpO2:  [87 %-100 %] 100 % (03/19 0812) Weight:  [84.2 kg (185 lb 10 oz)] 84.2 kg (185 lb 10 oz) (03/19 0453) HEMODYNAMICS: CVP:  [13 mmHg-21 mmHg] 14 mmHg VENTILATOR SETTINGS:   INTAKE / OUTPUT: Intake/Output     03/18 0701 - 03/19 0700 03/19 0701 - 03/20 0700   I.V. (mL/kg) 1292.5 (15.4) 42.7 (0.5)   NG/GT 920 40   IV Piggyback 350 50   Total Intake(mL/kg) 2562.5 (30.4) 132.7 (1.6)   Urine (mL/kg/hr) 3000 (1.5)    Total Output 3000     Net -437.5 +132.7         PHYSICAL EXAMINATION: General:  Critically ill appearing white female,  responsive  Neuro: RASS -2 and appears to follow some commands intermittently HEENT:  Orally intubated. Right IJ CVl in good position  Cardiovascular: RRR Lungs:  Diffuse rhonchi/rales.  Abdomen:  Soft, non-tender. + bowel sounds  Musculoskeletal:  Intact  Skin:  Intact other than some excoriations in the upper extremities  Dg Chest Port 1 View  05/23/2012  *RADIOLOGY REPORT*  Clinical Data: Check ET tube position  PORTABLE CHEST - 1 VIEW  Comparison: 05/22/2012  Findings: Cardiomegaly with pulmonary vascular congestion.  No frank interstitial edema.  Layering small right pleural effusion. Left lung base is obscured.  No pneumothorax.  Endotracheal tube terminates 5 cm above the carina.  Right IJ venous catheter terminates in the lower SVC.  Defibrillator pads overlying the left lower lung.  Valve prosthesis.  Median sternotomy.  IMPRESSION: Endotracheal tube terminates 5 cm above the carina.  Cardiomegaly with pulmonary vascular congestion.  No frank interstitial edema.  Layering small right pleural effusion.   Original Report Authenticated By: Julian Hy, M.D.    ASSESSMENT / PLAN:  Recent Labs Lab 05/18/12 1309  05/18/12 2256 05/19/12 0055 05/20/12 0405 05/21/12 0522 05/22/12 0347  PHART 7.292*  --   --   --  7.357 7.353 7.425  PCO2ART 38.4  --   --   --  33.3* 32.7* 30.5*  PO2ART 109.0*  --   --   --  132.0* 75.8* 102.0*  HCO3 18.5*  --   --   --  18.5* 17.8* 20.0  TCO2 20  < > 20 21 19.6 18.8 21  O2SAT 98.0  --   --   --  99.4 99.3 98.0  < > = values in this interval not displayed.  PULMONARY A: Acute respiratory failure s/p cardiac arrest Diffuse pulmonary infiltrates: not clear if this is d/t edema or aspiration. No report of vomiting.  P:   - Extubated, patient doing well post extubation but tenuous due pulmonary edema.  - D/C all sedation medications. - See ID section. - Will need further diureses.  CARDIOVASCULAR A:  VF arrest Afib  ICM w/ EF  35% Cardiogenic shock Severe AS and mitral valve disease.  Plan: - KVO IVF. - Ok to cardiovert intermittently while being cooled per discussion with cards and and family.  If continuous shocks are needed will not continue that. - Amio per cards, ?change to PO.  RENAL  Recent Labs Lab 05/20/12 0315  05/21/12 0549 05/21/12 1639 05/22/12 0300 05/23/12 0415 05/24/12 0500  NA 133*  < > 136 135 135 142 142  K 4.7  < > 4.2 4.0 3.5 3.6 3.8  CL 102  < > 104 103 103 108 105  CO2 19  < > 18* 21 21 24 28   GLUCOSE 123*  < > 114* 89 89 152* 173*  BUN 30*  < > 33* 31* 29* 32* 35*  CREATININE 1.11*  < > 1.42* 1.46* 1.32* 1.16* 1.07  CALCIUM 8.7  < > 8.4 8.2* 8.1* 8.2* 8.4  MG 1.8  --  2.2  --  1.8 2.1 1.7  PHOS 2.1*  --  4.3  --  3.3 3.1 3.9  < > = values in this interval not displayed.  A:  Chronic renal insufficiency +anion gap metabolic acidosis - 123456: Mild hypomagnesemia P:   - F/u BMET - Replace electrolytes as needed. - Lasix x2 doses. - K replacement PO and Mg IV.  GASTROINTESTINAL  Recent Labs Lab 05/18/12 1100  05/19/12 0422 05/20/12 0315 05/21/12 0549 05/22/12 0500 05/23/12 0800 05/24/12 0500  AST 210*  --  180* 82* 309*  --   --  110*  ALT 149*  --  150* 126* 286*  --   --  209*  ALKPHOS 105  --  101 113 99  --   --  113  BILITOT 0.2*  --  0.3 0.2* 0.3  --   --  0.4  PROT 6.0  --  5.8* 5.9* 5.5*  --   --  6.0  ALBUMIN 2.5*  --  2.7* 2.7* 2.4*  --   --  2.7*  INR 5.05*  < > 4.73* 7.09* 1.66* 1.39 1.55* 1.62*  < > = values in this interval not displayed.  A:   GERD Elevated LFTs: likely shock liver.  Lipase elevated - 05/22/2012: Continues to be n.p.o. P:   - PPI - D/C TF. - F/U LFT in AM. - Swallow evaluation failed, will continue TF for now and retest on thursday.  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 Left upper extremity is swollen but she is a heparin drip P:  - Heparin drip per pharmacy and transition to coumadin per cardiology. - Goal INR 2-2.5, now 4.7 but patient is being cooled, will not reverse further given artificial valve and the fact that patient is not bleeding. - Duplex of the left upper extremity positive for DVT, continue heparin drip. - Will need to restart coumadin when patient becomes more stable.  INFECTIOUS  Recent Labs Lab 05/18/12 1250 05/19/12 0421 05/19/12 0422 05/20/12 0315  LATICACIDVEN 7.87* 2.6*  --   --   PROCALCITON <0.10  --  4.74 3.59   A:  Possible aspiration  - 05/22/2012: Pro-calcitonin says is localized infection such as pneumonia P:   - Sputum culture negative. - Empiric unasyn, will finish an 8 day course.  ENDOCRINE A:  DM P:   - SSI protocol   NEUROLOGIC A:  Acute encephalopathy 05/22/2012: Following commands but appears deconditioned and lethargic. P:   - Hypothermia protocol complete. - PRN fentanyl for pain and d/c sedation protocol.  I have personally obtained a history, examined the patient, evaluated laboratory and imaging results, formulated the assessment and plan and placed orders.  CRITICAL CARE: The patient is critically ill with multiple organ systems failure and requires high complexity decision making for assessment and support, frequent evaluation and titration of therapies, application of advanced monitoring technologies and extensive interpretation of multiple databases. Critical Care Time devoted to patient care services described in this note is 35 minutes.   Rush Farmer, M.D. [facility] Pulmonary/Critical Care Medicine. Pager: [phone]. After hours pager: [phone].

## 2012-05-24 NOTE — Evaluation (Signed)
Physical Therapy Evaluation Patient Details Name: [name] MRN: [id number] DOB: [birth year] Today's Date: 05/24/2012 Time: [id number] PT Time Calculation (min): 34 min  PT Assessment / Plan / Recommendation Clinical Impression  72 y/o female adm. sudden cardiac arrest requring prolonged resucitation, with multiple drugs and shocks, but pulses returned in about 45 minute. Cooling protocol and intubated 3/13->3/18. Presents to PT today with below impairments affecting PLOF. Will benefit physical therapy in the acute setting to maximize functional independence and safe d/c plan.    PT Assessment  Patient needs continued PT services    Follow Up Recommendations  CIR    Does the patient have the potential to tolerate intense rehabilitation      Barriers to Discharge        Equipment Recommendations  Rolling walker with 5" wheels    Recommendations for Other Services OT consult   Frequency Min 3X/week    Precautions / Restrictions Precautions Precautions: Fall Precaution Comments: LUE DVT, on heparin Restrictions Weight Bearing Restrictions: No   Pertinent Vitals/Pain Denies pain, did have runs of VT on monitor however RN aware and reports she has abnormal long intervals which could be accounting for this; HR 108-110 during our session; c/o dizziness initially upon sitting up BP 162/108 (supine)->126/96 (sitting), the dizziness pass per patient report however she was rather unsteady in sitting and standing      Mobility  Bed Mobility Bed Mobility: Supine to Sit Supine to Sit: 3: Mod assist Details for Bed Mobility Assistance: mod sequencing and faciliatory assist Transfers Transfers: Sit to Stand;Stand to Sit;Stand Pivot Transfers Sit to Stand: 1: +2 Total assist;With upper extremity assist;From bed Sit to Stand: Patient Percentage: 60% Stand to Sit: 1: +2 Total assist;With upper extremity assist;To bed Stand to Sit: Patient Percentage: 60% Stand Pivot Transfers: 3: Mod  assist (bed->recliner) Details for Transfer Assistance: cues for safe technique, facilitation to stabilize as she stood with trunk support (floppy trunk); cues for tall posture; transferred bed->recliner with facilitation for stability (2nd person for safety to guide her hips to chair) Ambulation/Gait Ambulation/Gait Assistance: Not tested (comment)         PT Diagnosis: Difficulty walking;Abnormality of gait;Generalized weakness;Altered mental status  PT Problem List: Decreased strength;Decreased activity tolerance;Decreased balance;Decreased mobility;Decreased cognition;Decreased knowledge of use of DME;Decreased safety awareness;Cardiopulmonary status limiting activity;Decreased coordination PT Treatment Interventions: DME instruction;Gait training;Stair training;Functional mobility training;Therapeutic activities;Therapeutic exercise   PT Goals Acute Rehab PT Goals PT Goal Formulation: With patient/family Potential to Achieve Goals: Good Pt will Roll Supine to Right Side: with supervision PT Goal: Rolling Supine to Right Side - Progress: Goal set today Pt will Roll Supine to Left Side: with supervision PT Goal: Rolling Supine to Left Side - Progress: Goal set today Pt will go Supine/Side to Sit: with supervision PT Goal: Supine/Side to Sit - Progress: Goal set today Pt will Sit at Edge of Bed: with supervision PT Goal: Sit at Edge Of Bed - Progress: Goal set today Pt will go Sit to Supine/Side: with supervision PT Goal: Sit to Supine/Side - Progress: Goal set today Pt will go Sit to Stand: with supervision PT Goal: Sit to Stand - Progress: Goal set today Pt will go Stand to Sit: with supervision PT Goal: Stand to Sit - Progress: Goal set today Pt will Stand: with supervision;with bilateral upper extremity support;3 - 5 min PT Goal: Stand - Progress: Goal set today Pt will Ambulate: 51 - 150 feet;with supervision;with least restrictive assistive device PT Goal: Ambulate - Progress:  Goal set today Pt will Perform Home Exercise Program: with supervision, verbal cues required/provided PT Goal: Perform Home Exercise Program - Progress: Goal set today  Visit Information  Last PT Received On: 05/24/12 Assistance Needed: +2    Subjective Data  Subjective: I want some ice chips.  Patient Stated Goal: home   Prior Owen Lives With: Spouse Available Help at Discharge: Family;Available 24 hours/day Type of Home: House Home Access: Stairs to enter Home Layout: One level Home Adaptive Equipment: None Prior Function Level of Independence: Independent Able to Take Stairs?: Yes Driving: Yes Vocation: Retired Comments: did get out of breath with long distances of walking Communication Communication: No difficulties    Cognition  Cognition Overall Cognitive Status: Impaired Area of Impairment: Following commands;Problem solving;Safety/judgement Arousal/Alertness: Lethargic Orientation Level: Disoriented to;Time;Situation Behavior During Session: WFL for tasks performed Following Commands: Follows one step commands with increased time Safety/Judgement: Decreased awareness of need for assistance;Decreased safety judgement for tasks assessed Problem Solving: modA basic  Cognition - Other Comments: slow processing, slow reaction time    Extremity/Trunk Assessment Right Upper Extremity Assessment RUE ROM/Strength/Tone: WFL for tasks assessed RUE Sensation: WFL - Light Touch Left Upper Extremity Assessment LUE ROM/Strength/Tone: Deficits;Unable to fully assess;Due to impaired cognition LUE ROM/Strength/Tone Deficits: LUE DVT noted in chart, edematous extremity and heavy for her to lift, difficulty coordinating it and decreased awareness of this limb at times placing it in positions that may injure it needing assist to correct and cueing to attend LUE Sensation:  (pt reports normal feeling however decreased cognition) Right Lower Extremity  Assessment RLE ROM/Strength/Tone: Deficits RLE ROM/Strength/Tone Deficits: generally weak, grossly 3/5  RLE Sensation: WFL - Light Touch RLE Coordination: WFL - gross/fine motor Left Lower Extremity Assessment LLE ROM/Strength/Tone: Deficits LLE ROM/Strength/Tone Deficits: generally weak, grossly 3/5 LLE Sensation: WFL - Light Touch LLE Coordination: WFL - gross/fine motor Trunk Assessment Trunk Assessment: Kyphotic   Balance Balance Balance Assessed: Yes Static Sitting Balance Static Sitting - Balance Support: Right upper extremity supported Static Sitting - Level of Assistance: 4: Min assist Static Sitting - Comment/# of Minutes: tends to fall posterioly, cues for upright posture and attention to midline Static Standing Balance Static Standing - Balance Support: Bilateral upper extremity supported Static Standing - Level of Assistance: 1: +2 Total assist (50%) Static Standing - Comment/# of Minutes: trunk support, did try to buckle through her right knee; floppy posture  End of Session PT - End of Session Equipment Utilized During Treatment: Gait belt;Oxygen Activity Tolerance: Patient tolerated treatment well Patient left: in chair;with call bell/phone within reach Nurse Communication: Mobility status  GP     [place] 05/24/2012, 9:05 AM

## 2012-05-24 NOTE — Progress Notes (Signed)
ANTIBIOTIC/ANTICOAGULANT CONSULT NOTE - FOLLOW UP  Pharmacy Consult for Unasyn/Heparin and Coumadin Indication: ?aspiration PNA/atrial fibrillation and AVR + MV repair   No Known Allergies  Patient Measurements: Height: 5\' 6"  (167.6 cm) Weight: 185 lb 10 oz (84.2 kg) IBW/kg (Calculated) : 59.3 Heparin Dosing Weight: 77kg   Vital Signs: Temp: 98.2 F (36.8 C) (03/19 0800) Temp src: Oral (03/19 0800) BP: 143/80 mmHg (03/19 0800) Pulse Rate: 109 (03/19 0800) Intake/Output from previous day: 03/18 0701 - 03/19 0700 In: 2562.5 [I.V.:1292.5; NG/GT:920; IV Piggyback:350] Out: 3000 [Urine:3000] Intake/Output from this shift: Total I/O In: 132.7 [I.V.:42.7; NG/GT:40; IV Piggyback:50] Out: -   Labs:  Recent Labs  05/22/12 0300 05/22/12 0500  05/23/12 0415 05/23/12 0800 05/23/12 1600 05/24/12 0500  HGB 7.4*  --   --  7.8*  --   --  7.4*  HCT 22.3*  --   --  24.1*  --   --  23.4*  PLT 296  --   --  335  --   --  329  APTT  --  30  --   --  153*  --  >200*  LABPROT  --  16.7*  --   --  18.1*  --  18.7*  INR  --  1.39  --   --  1.55*  --  1.62*  HEPARINUNFRC  --   --   < >  --  0.62 0.56 0.61  CREATININE 1.32*  --   --  1.16*  --   --  1.07  < > = values in this interval not displayed.  Estimated Creatinine Clearance: 52.8 ml/min (by C-G formula based on Cr of 1.07). No results found for this basename: VANCOTROUGH, Corlis Leak, VANCORANDOM, [place], GENTPEAK, GENTRANDOM, TOBRATROUGH, TOBRAPEAK, TOBRARND, AMIKACINPEAK, AMIKACINTROU, AMIKACIN,  in the last 72 hours   Microbiology: Recent Results (from the past 720 hour(s))  MRSA PCR SCREENING     Status: None   Collection Time    05/18/12  4:20 PM      Result Value Range Status   MRSA by PCR NEGATIVE  NEGATIVE Final   Comment:            The GeneXpert MRSA Assay (FDA     approved for NASAL specimens     only), is one component of a     comprehensive MRSA colonization     surveillance program. It is not     intended to  diagnose MRSA     infection nor to guide or     monitor treatment for     MRSA infections.  CULTURE, RESPIRATORY (NON-EXPECTORATED)     Status: None   Collection Time    05/22/12 12:30 PM      Result Value Range Status   Specimen Description OTHER   Final   Special Requests ETT SUCTION   Final   Gram Stain     Final   Value: MODERATE WBC PRESENT,BOTH PMN AND MONONUCLEAR     RARE SQUAMOUS EPITHELIAL CELLS PRESENT     NO ORGANISMS SEEN   Culture     Final   Value: RARE GRAM NEGATIVE RODS     FEW CANDIDA ALBICANS   Report Status PENDING   Incomplete    Anti-infectives   Start     Dose/Rate Route Frequency Ordered Stop   05/18/12 1930  ampicillin-sulbactam (UNASYN) 1.5 g in sodium chloride 0.9 % 50 mL IVPB     1.5 g 100 mL/hr over 30 Minutes Intravenous  Every 6 hours 05/18/12 1317     05/18/12 1330  ampicillin-sulbactam (UNASYN) 1.5 g in sodium chloride 0.9 % 50 mL IVPB     1.5 g 100 mL/hr over 30 Minutes Intravenous To Emergency Dept 05/18/12 1317 05/18/12 1409     . sodium chloride 20 mL/hr at 05/24/12 0600  . amiodarone (NEXTERONE PREMIX) 360 mg/200 mL dextrose 30.06 mg/hr (05/24/12 0600)  . heparin 1,600 Units/hr (05/24/12 0600)  . isoproterenol (ISUPREL) infusion Stopped (05/20/12 0257)  . phenylephrine (NEO-SYNEPHRINE) Adult infusion Stopped (05/23/12 0459)  . [DISCONTINUED] fentaNYL infusion INTRAVENOUS 25 mcg/hr (05/23/12 0551)   Assessment: 72 year old female admitted s/p VF arrest and now s/p artic sun. Pt on coumadin PTA for atrial fibrillation, now on heparin. Home coumadin dose: 2mg  MWF and 4mg  TTSS. Patient for ICD late this week or early next week.   Started on empiric Unasyn for coverage of aspiration pneumonia. CXR 3/18 shows small, right pleural effusion. WBC 11.4 (trend down) and Tmax 99.1. Renal stable with SCr 1.07 and CrCl 52.18ml/min. Respiratory culture growing rare gram negative rods and few candida albicans.   INR on 3/15 supratherapeutic at 7.02, which  was reversed with 1mg  IV vitamin K.  Today, heparin level is therapeutic at 0.61 and INR is subtherapeutic at 1.62. H/H 7.4/23.4 (low stable) and platelets are 329 (stable).   Goal of Therapy:  Eradicate infection  INR 2.5-3.5 (per Dr. Alver Fisher note 3/13) Heparin level 0.3-0.7 units/ml Monitor platelets by anticoagulation protocol: Yes   Plan:  Continue Unasyn 1.5g q6h  F/u renal function, cultures and LOT  Continue heparin drip at 1600 units/hr  Heparin level with AM labs  Daily CBC  Coumadin 2mg  x1 tonight   Carmel [place] PharmD Candidate 05/24/2012,8:46 AM   I have reviewed the above and agree with the plan.  Will proceed cautiously with coumadin and consider increase in dose in am.  Hildred Laser, Pharm D 05/24/2012 9:26 AM

## 2012-05-24 NOTE — Consult Note (Signed)
Physical Medicine and Rehabilitation Consult Reason for Consult: Deconditioning Referring Physician: Riggle care   HPI: [name] is a 72 y.o. right-handed female with history of atrial fibrillation on chronic Coumadin therapy, CVA 2008, severe aortic stenosis and mitral regurgitation, chronic systolic congestive heart failure and ischemic cardiomyopathy. Admitted 05/18/2012 after becoming unresponsive while riding in the car. On arrival to the ED was found to be in VF arrest. Team estimated 45 minutes of ACLS. INR upon admission of greater than 5.00 and did receive fresh frozen plasma. Cranial CT scan showed no acute abnormalities. Question aspiration pneumonia and placed on Unasyn. Cardiology services consulted maintained on Levophed for pressure support. Troponin less than 0.30. EKG demonstrating wide QRS with prolonged QT. Patient maintained on ventilatory support. Upper extremity Dopplers 05/22/2012 did show findings consistent with superficial vein thrombosis left cephalic vein left basilic vein. Patient currently maintained on intravenous heparin as well as Coumadin therapy resumed. She remained intubated from 05/18/2012 at 05/23/2012. She is currently  n.p.o. with tube feedings and speech therapy planning swallow study. Physical therapy evaluation completed 05/24/2012 with recommendations of physical medicine rehabilitation consult to consider inpatient rehabilitation services  Review of Systems  Cardiovascular: Positive for palpitations and leg swelling.       Anginal pain  Gastrointestinal:       Reflux  Musculoskeletal: Positive for myalgias.  Neurological: Positive for weakness.  All other systems reviewed and are negative.   Past Medical History  Diagnosis Date  . Anemia   . Atrial fibrillation     A.fib/flutter: s/p MAZE 2008, DCCV 2011, on coumadin; previously on flecainide, but dc'd due to worsening EF  . Diverticulitis   . Valvular heart disease     bicuspid AV/severe AS,  mitral regurgitation, & aneurysm of the ascending thoracic aorta (s/p AV replacement, MV repair, resection and grafting of the ascending thoracic aortic aneurysm 2008  . CVA (cerebral vascular accident)     2008 - felt due to oscillating calcium on aortic valve  . Carcinoma in situ of vulva   . Fibroid   . Ovarian cyst, left 2008-2009  . Osteoporosis   . Diabetes mellitus   . Hypertension   . GERD (gastroesophageal reflux disease)   . Chronic systolic CHF (congestive heart failure)     EF 35%  . Ischemic cardiomyopathy     EF 35%  . Coronary artery disease     Occluded LM 2/2 previous aortic root surgery; s/p 2v CABG 2010 LIMA to LAD, SVG to OM; Cath 03/11/12 patent SVG to OM, atretic LIMA to LAD, patent RCA, occluded native LM, EF 35%   . Shortness of breath   . Arthritis     OSTEO  . Fracture of lumbar spine    Past Surgical History  Procedure Laterality Date  . Foot surgery      removed neuroma  . Aortic root replacement  2008    homograft  . Mv repair  2008    due to severe MR  . Resection and grafting of ascending thoracic aortic    . Aneurysm and left sided maze  2008  . Coronary artery bypass graft  4/10    LIMA to LAD, SVG to OM  . Wide excision of vulva      CA INSITU   Family History  Problem Relation Age of Onset  . Hypertension Mother   . Breast cancer Maternal Aunt     age 28's  . Cancer Paternal Grandmother  COLON   Social History:  reports that she quit smoking about 29 years ago. She has never used smokeless tobacco. She reports that she does not drink alcohol or use illicit drugs. Allergies: No Known Allergies Medications Prior to Admission  Medication Sig Dispense Refill  . Cholecalciferol (VITAMIN D3) 50000 UNITS CAPS Take 1 tablet by mouth once a week.  12 capsule  3  . Cyanocobalamin (VITAMIN B-12) 2500 MCG SUBL Place 1 tablet under the tongue 3 (three) times a week. Dissolves 1 tablet under the tongue on Monday, Wednesday, and Friday      .  dofetilide (TIKOSYN) 125 MCG capsule Take 3 capsules (375 mcg total) by mouth every 12 (twelve) hours.  180 capsule  6  . furosemide (LASIX) 20 MG tablet Take 20 mg by mouth every morning.      . magnesium oxide (MAG-OX) 400 MG tablet Take 800 mg by mouth 3 (three) times daily.      . metFORMIN (GLUCOPHAGE-XR) 500 MG 24 hr tablet Take 500-1,500 mg by mouth 2 (two) times daily with a meal. Takes 1 tablet in am and 3 tablets in pm      . metoprolol succinate (TOPROL-XL) 100 MG 24 hr tablet Take 100 mg by mouth every morning.       . metoprolol succinate (TOPROL-XL) 50 MG 24 hr tablet Take 50 mg by mouth at bedtime. Take with or immediately following a meal.      . MICARDIS 20 MG tablet TAKE 1/2 TABLET BY MOUTH EVERY DAY  30 tablet  1  . omeprazole (PRILOSEC OTC) 20 MG tablet Take 20 mg by mouth daily after breakfast.       . pioglitazone (ACTOS) 45 MG tablet Take 45 mg by mouth every morning.      Marland Kitchen spironolactone (ALDACTONE) 25 MG tablet Take 12.5 mg by mouth daily.      Marland Kitchen telmisartan (MICARDIS) 20 MG tablet Take 10 mg by mouth every morning.       . warfarin (COUMADIN) 2 MG tablet Take 2 mg by mouth 3 (three) times a week. Monday, Wednesday and Friday      . warfarin (COUMADIN) 4 MG tablet Take 4 mg by mouth 4 (four) times a week. Tuesday, Thursday, Saturday and Sunday        Home: Home Living Lives With: Spouse Available Help at Discharge: Family;Available 24 hours/day Type of Home: House Home Access: Stairs to enter Home Layout: One level Home Adaptive Equipment: None  Functional History: Prior Function Able to Take Stairs?: Yes Driving: Yes Vocation: Retired Comments: did get out of breath with long distances of walking Functional Status:  Mobility: Bed Mobility Bed Mobility: Supine to Sit Supine to Sit: 3: Mod assist Transfers Transfers: Sit to Stand;Stand to Sit;Stand Pivot Transfers Sit to Stand: 1: +2 Total assist;With upper extremity assist;From bed Sit to Stand: Patient  Percentage: 60% Stand to Sit: 1: +2 Total assist;With upper extremity assist;To bed Stand to Sit: Patient Percentage: 60% Stand Pivot Transfers: 3: Mod assist (bed->recliner) Ambulation/Gait Ambulation/Gait Assistance: Not tested (comment)    ADL:    Cognition: Cognition Arousal/Alertness: Lethargic Orientation Level: Oriented to person;Oriented to situation Cognition Overall Cognitive Status: Impaired Area of Impairment: Following commands;Problem solving;Safety/judgement Arousal/Alertness: Lethargic Orientation Level: Disoriented to;Time;Situation Behavior During Session: WFL for tasks performed Following Commands: Follows one step commands with increased time Safety/Judgement: Decreased awareness of need for assistance;Decreased safety judgement for tasks assessed Problem Solving: modA basic  Cognition - Other Comments: slow processing,  slow reaction time  Blood pressure 114/59, pulse 98, temperature 98.2 F (36.8 C), temperature source Oral, resp. rate 21, height 5\' 6"  (1.676 m), weight 84.2 kg (185 lb 10 oz), SpO2 100.00%. Physical Exam  Vitals reviewed. Constitutional:  72 year old female with nasogastric tube in place  HENT:  Head: Normocephalic.  Eyes: EOM are normal.  Neck: Neck supple. No thyromegaly present.  Cardiovascular:  Cardiac rate control  Pulmonary/Chest:  Decreased breath sounds at the bases  Abdominal: Bowel sounds are normal. She exhibits no distension.  Musculoskeletal:  LUE swollen with bruising.  Neurological: She is alert.  Patient is a poor medical historian. She was able to provide her date of birth but not her appropriate age. She was able to name the hospital. Followed simple commands but easily distracted. Strength nearly symmetrical and grossly 3/5 prox to 4/5 distally . No sensory abnl.  Skin: Skin is warm and dry.  Psychiatric: She has a normal mood and affect. Her behavior is normal.    Results for orders placed during the hospital  encounter of 05/18/12 (from the past 24 hour(s))  GLUCOSE, CAPILLARY     Status: Abnormal   Collection Time    05/23/12 11:49 AM      Result Value Range   Glucose-Capillary 155 (*) 70 - 99 mg/dL  HEPARIN LEVEL (UNFRACTIONATED)     Status: None   Collection Time    05/23/12  4:00 PM      Result Value Range   Heparin Unfractionated 0.56  0.30 - 0.70 IU/mL  GLUCOSE, CAPILLARY     Status: Abnormal   Collection Time    05/23/12  4:09 PM      Result Value Range   Glucose-Capillary 145 (*) 70 - 99 mg/dL  GLUCOSE, CAPILLARY     Status: Abnormal   Collection Time    05/23/12  8:27 PM      Result Value Range   Glucose-Capillary 190 (*) 70 - 99 mg/dL  GLUCOSE, CAPILLARY     Status: Abnormal   Collection Time    05/24/12 12:04 AM      Result Value Range   Glucose-Capillary 148 (*) 70 - 99 mg/dL  GLUCOSE, CAPILLARY     Status: Abnormal   Collection Time    05/24/12  3:40 AM      Result Value Range   Glucose-Capillary 142 (*) 70 - 99 mg/dL  BASIC METABOLIC PANEL     Status: Abnormal   Collection Time    05/24/12  5:00 AM      Result Value Range   Sodium 142  135 - 145 mEq/L   Potassium 3.8  3.5 - 5.1 mEq/L   Chloride 105  96 - 112 mEq/L   CO2 28  19 - 32 mEq/L   Glucose, Bld 173 (*) 70 - 99 mg/dL   BUN 35 (*) 6 - 23 mg/dL   Creatinine, Ser 1.07  0.50 - 1.10 mg/dL   Calcium 8.4  8.4 - 10.5 mg/dL   GFR calc non Af Amer 51 (*) >90 mL/min   GFR calc Af Amer 59 (*) >90 mL/min  HEPARIN LEVEL (UNFRACTIONATED)     Status: None   Collection Time    05/24/12  5:00 AM      Result Value Range   Heparin Unfractionated 0.61  0.30 - 0.70 IU/mL  PROTIME-INR     Status: Abnormal   Collection Time    05/24/12  5:00 AM      Result Value  Range   Prothrombin Time 18.7 (*) 11.6 - 15.2 seconds   INR 1.62 (*) 0.00 - 1.49  APTT     Status: Abnormal   Collection Time    05/24/12  5:00 AM      Result Value Range   aPTT >200 (*) 24 - 37 seconds  CBC     Status: Abnormal   Collection Time     05/24/12  5:00 AM      Result Value Range   WBC 11.4 (*) 4.0 - 10.5 K/uL   RBC 2.88 (*) 3.87 - 5.11 MIL/uL   Hemoglobin 7.4 (*) 12.0 - 15.0 g/dL   HCT 23.4 (*) 36.0 - 46.0 %   MCV 81.3  78.0 - 100.0 fL   MCH 25.7 (*) 26.0 - 34.0 pg   MCHC 31.6  30.0 - 36.0 g/dL   RDW 15.5  11.5 - 15.5 %   Platelets 329  150 - 400 K/uL  MAGNESIUM     Status: None   Collection Time    05/24/12  5:00 AM      Result Value Range   Magnesium 1.7  1.5 - 2.5 mg/dL  PHOSPHORUS     Status: None   Collection Time    05/24/12  5:00 AM      Result Value Range   Phosphorus 3.9  2.3 - 4.6 mg/dL  HEPATIC FUNCTION PANEL     Status: Abnormal   Collection Time    05/24/12  5:00 AM      Result Value Range   Total Protein 6.0  6.0 - 8.3 g/dL   Albumin 2.7 (*) 3.5 - 5.2 g/dL   AST 110 (*) 0 - 37 U/L   ALT 209 (*) 0 - 35 U/L   Alkaline Phosphatase 113  39 - 117 U/L   Total Bilirubin 0.4  0.3 - 1.2 mg/dL   Bilirubin, Direct 0.2  0.0 - 0.3 mg/dL   Indirect Bilirubin 0.2 (*) 0.3 - 0.9 mg/dL   Dg Chest Port 1 View  05/23/2012  *RADIOLOGY REPORT*  Clinical Data: Check ET tube position  PORTABLE CHEST - 1 VIEW  Comparison: 05/22/2012  Findings: Cardiomegaly with pulmonary vascular congestion.  No frank interstitial edema.  Layering small right pleural effusion. Left lung base is obscured.  No pneumothorax.  Endotracheal tube terminates 5 cm above the carina.  Right IJ venous catheter terminates in the lower SVC.  Defibrillator pads overlying the left lower lung.  Valve prosthesis.  Median sternotomy.  IMPRESSION: Endotracheal tube terminates 5 cm above the carina.  Cardiomegaly with pulmonary vascular congestion.  No frank interstitial edema.  Layering small right pleural effusion.   Original Report Authenticated By: Julian Hy, M.D.     Assessment/Plan: Diagnosis: Deconditioning after cardiac arrest, respiratory failure, multiple medical issues 1. Does the need for close, 24 hr/day medical supervision in concert  with the patient's rehab needs make it unreasonable for this patient to be served in a less intensive setting? Yes 2. Co-Morbidities requiring supervision/potential complications: MVD, afib, vfib 3. Due to bladder management, bowel management, safety, skin/wound care, disease management, medication administration, pain management and patient education, does the patient require 24 hr/day rehab nursing? Yes and Potentially 4. Does the patient require coordinated care of a physician, rehab nurse, PT (1-2 hrs/day, 5 days/week), OT (1-2 hrs/day, 5 days/week) and SLP (1-2 hrs/day, 5 days/week) to address physical and functional deficits in the context of the above medical diagnosis(es)? Yes Addressing deficits in the following areas:  balance, endurance, locomotion, strength, transferring, bowel/bladder control, bathing, dressing, feeding, grooming, toileting, cognition, swallowing and psychosocial support 5. Can the patient actively participate in an intensive therapy program of at least 3 hrs of therapy per day at least 5 days per week? Yes and Potentially 6. The potential for patient to make measurable gains while on inpatient rehab is good 7. Anticipated functional outcomes upon discharge from inpatient rehab are supervision to min assist with PT, min assist to supervision with OT, supervision with SLP. 8. Estimated rehab length of stay to reach the above functional goals is: 2-3 weeks 9. Does the patient have adequate social supports to accommodate these discharge functional goals? Yes 10. Anticipated D/C setting: Home 11. Anticipated post D/C treatments: [place] therapy 12. Overall Rehab/Functional Prognosis: excellent  RECOMMENDATIONS: This patient's condition is appropriate for continued rehabilitative care in the following setting: CIR Patient has agreed to participate in recommended program. Yes Note that insurance prior authorization may be required for reimbursement for recommended  care.  Comment:Rehab RN to follow up.   Meredith Staggers, MD, Mellody Drown     05/24/2012

## 2012-05-24 NOTE — Progress Notes (Signed)
Rehab Admissions Coordinator Note:  Patient was screened by Cleatrice Burke for appropriateness for an Inpatient Acute Rehab Consult.  At this time, we are recommending Inpatient Rehab consult as well as OT eval. Please order.  Cleatrice Burke 05/24/2012, 11:24 AM  I can be reached at [phone].

## 2012-05-25 LAB — GLUCOSE, CAPILLARY
Glucose-Capillary: 167 mg/dL — ABNORMAL HIGH (ref 70–99)
Glucose-Capillary: 168 mg/dL — ABNORMAL HIGH (ref 70–99)

## 2012-05-25 LAB — CBC
HCT: 24.6 % — ABNORMAL LOW (ref 36.0–46.0)
MCHC: 30.5 g/dL (ref 30.0–36.0)
MCV: 84.5 fL (ref 78.0–100.0)
Platelets: 379 10*3/uL (ref 150–400)
RDW: 15.4 % (ref 11.5–15.5)
WBC: 13.5 10*3/uL — ABNORMAL HIGH (ref 4.0–10.5)

## 2012-05-25 LAB — BASIC METABOLIC PANEL
BUN: 38 mg/dL — ABNORMAL HIGH (ref 6–23)
CO2: 31 mEq/L (ref 19–32)
Chloride: 105 mEq/L (ref 96–112)
Creatinine, Ser: 1.07 mg/dL (ref 0.50–1.10)
Potassium: 4.2 mEq/L (ref 3.5–5.1)

## 2012-05-25 LAB — PROTIME-INR: INR: 1.41 (ref 0.00–1.49)

## 2012-05-25 LAB — HEPARIN LEVEL (UNFRACTIONATED): Heparin Unfractionated: 0.53 IU/mL (ref 0.30–0.70)

## 2012-05-25 LAB — CULTURE, RESPIRATORY W GRAM STAIN

## 2012-05-25 MED ORDER — FUROSEMIDE 10 MG/ML IJ SOLN: 40.0000 mg | Freq: Three times a day (TID) | INTRAMUSCULAR | Status: DC

## 2012-05-25 MED ORDER — AMIODARONE HCL 200 MG PO TABS
400.0000 mg | ORAL_TABLET | Freq: Two times a day (BID) | ORAL | Status: DC
  Administered 2012-05-25 – 2012-05-27 (×6): 400 mg via NASOGASTRIC
  Filled 2012-05-25 (×8): qty 2

## 2012-05-25 MED ORDER — POTASSIUM CHLORIDE 20 MEQ/15ML (10%) PO LIQD
40.0000 meq | Freq: Three times a day (TID) | ORAL | Status: DC
  Filled 2012-05-25 (×2): qty 30

## 2012-05-25 MED ORDER — CARVEDILOL 6.25 MG PO TABS
6.2500 mg | ORAL_TABLET | Freq: Two times a day (BID) | ORAL | Status: DC
  Administered 2012-05-25 (×2): 6.25 mg via ORAL
  Filled 2012-05-25 (×5): qty 1

## 2012-05-25 MED ORDER — FUROSEMIDE 10 MG/ML IJ SOLN
40.0000 mg | Freq: Two times a day (BID) | INTRAMUSCULAR | Status: DC
  Administered 2012-05-25 – 2012-05-26 (×3): 40 mg via INTRAVENOUS
  Filled 2012-05-25 (×5): qty 4

## 2012-05-25 MED ORDER — MAGNESIUM SULFATE 40 MG/ML IJ SOLN: 2.0000 g | Freq: Once | INTRAMUSCULAR | Status: DC

## 2012-05-25 MED ORDER — WARFARIN SODIUM 4 MG PO TABS
4.0000 mg | ORAL_TABLET | Freq: Once | ORAL | Status: AC
  Administered 2012-05-25: 4 mg via ORAL
  Filled 2012-05-25: qty 1

## 2012-05-25 MED ORDER — MAGNESIUM SULFATE 40 MG/ML IJ SOLN
2.0000 g | Freq: Once | INTRAMUSCULAR | Status: AC
  Administered 2012-05-25: 2 g via INTRAVENOUS
  Filled 2012-05-25: qty 50

## 2012-05-25 NOTE — Progress Notes (Signed)
ANTICOAGULANT CONSULT NOTE - FOLLOW UP  Pharmacy Consult for heparin  Indication: atrial fibrillation and AVR + MV repair   No Known Allergies  Patient Measurements: Height: 5\' 6"  (167.6 cm) Weight: 185 lb 10 oz (84.2 kg) IBW/kg (Calculated) : 59.3 Heparin Dosing Weight: 77kg   Vital Signs: Temp: 98.4 F (36.9 C) (03/20 1200) Temp src: Oral (03/20 1200) BP: 139/61 mmHg (03/20 1100) Pulse Rate: 92 (03/20 1100) Intake/Output from previous day: 03/19 0701 - 03/20 0700 In: 2299.4 [I.V.:1129.4; NG/GT:920; IV Piggyback:250] Out: 2750 [Urine:2750] Intake/Output from this shift: Total I/O In: 590.2 [I.V.:250.2; NG/GT:240; IV Piggyback:100] Out: 350 [Urine:350]  Labs:  Recent Labs  05/23/12 0415 05/23/12 0800  05/24/12 0500 05/25/12 0359 05/25/12 1230  HGB 7.8*  --   --  7.4* 7.5*  --   HCT 24.1*  --   --  23.4* 24.6*  --   PLT 335  --   --  329 379  --   APTT  --  153*  --  >200*  --   --   LABPROT  --  18.1*  --  18.7* 16.9*  --   INR  --  1.55*  --  1.62* 1.41  --   HEPARINUNFRC  --  0.62  < > 0.61 0.74* 0.53  CREATININE 1.16*  --   --  1.07 1.07  --   < > = values in this interval not displayed.  Estimated Creatinine Clearance: 52.8 ml/min (by C-G formula based on Cr of 1.07). No results found for this basename: VANCOTROUGH, VANCOPEAK, VANCORANDOM, GENTTROUGH, GENTPEAK, GENTRANDOM, TOBRATROUGH, TOBRAPEAK, TOBRARND, AMIKACINPEAK, AMIKACINTROU, AMIKACIN,  in the last 72 hours    Assessment: 72 year old female admitted s/p VF arrest and now s/p artic sun. Pt on coumadin PTA for atrial fibrillation. On 3/15 INR was supratherapeutic at 7.02, which was reversed with 1mg  IV vitamin K.  Home coumadin dose: 2mg  MWF and 4mg  TTSS.   INR today is 1.41. Heparin level (0.53) is  at goal on 1500 units/hr. Patient noted for ICD late this week or early next week.   Goal of Therapy:  INR 2.5-3.5 (per Dr. Alver Fisher note 3/13) Heparin level 0.3-0.7 units/ml Monitor platelets by  anticoagulation protocol: Yes   Plan:  -No heparin changes needed -Coumadin 4mg  po today -Daily HL, CBC, PT/INR  Hildred Laser, Pharm D 05/25/2012 1:35 PM

## 2012-05-25 NOTE — Progress Notes (Signed)
PULMONARY  / CRITICAL CARE MEDICINE  Name: [name] MRN: [id number] DOB: 08/10/1940    ADMISSION DATE:  05/18/2012 CONSULTATION DATE:  3/13  REFERRING MD :  Maryan Rued  CHIEF COMPLAINT:  Cardiac arrest   BRIEF PATIENT DESCRIPTION:  72 year old female w/ h/o ICM (EF 35%), AF, severe AS and MR. Recently started on Tikosyn. Was in usual state of health until 3/13 when she was riding in the car and became suddenly unresponsive. Her husband estimates 6 minutes driving to ER. On arrival found to be in VF arrest. Team estimates 45 minutes of ACLS to ROSC. PCCM asked to admit.   SIGNIFICANT EVENTS / STUDIES:  CT head 3/12>>> Echo 3/12>>>  LINES / TUBES: OETT 3/12>>> Right IJ CVL 3/12>>> Right rad aline 3/12>>>  CULTURES: Sputum 3/12>>>NTD MRS NTD  ANTIBIOTICS: Unasyn 3/12>>>3/20  SUBJECTIVE:  Feels much better, c/o LUE pain and pressure.  VITAL SIGNS: Temp:  [98.1 F (36.7 C)-98.8 F (37.1 C)] 98.4 F (36.9 C) (03/20 0300) Pulse Rate:  [91-110] 98 (03/20 0600) BP: (113-143)/(54-78) 122/70 mmHg (03/20 0600) SpO2:  [66 %-100 %] 98 % (03/20 0600) HEMODYNAMICS: CVP:  [12 mmHg-18 mmHg] 18 mmHg VENTILATOR SETTINGS:   INTAKE / OUTPUT: Intake/Output     03/19 0701 - 03/20 0700 03/20 0701 - 03/21 0700   I.V. (mL/kg) 1129.4 (13.4)    NG/GT 920    IV Piggyback 250    Total Intake(mL/kg) 2299.4 (27.3)    Urine (mL/kg/hr) 2750 (1.4)    Total Output 2750     Net -450.7           PHYSICAL EXAMINATION: General: Chronically ill appearing white female, responsive  Neuro: Alert and interactive, follows all commands. HEENT: Extubated, [place]/AT, PERRL, EOM-I and -LAN. Cardiovascular: RRR, Nl S1/S2, -M/R/G. Lungs: Bibasilar rales but stable..  Abdomen:  Soft, non-tender. + bowel sounds  Musculoskeletal:  Intact, L>R upper ext edema. Skin:  Intact other than some excoriations in the upper extremities  No results found. ASSESSMENT / PLAN:  Recent Labs Lab 05/18/12 1309   05/18/12 2256 05/19/12 0055 05/20/12 0405 05/21/12 0522 05/22/12 0347  PHART 7.292*  --   --   --  7.357 7.353 7.425  PCO2ART 38.4  --   --   --  33.3* 32.7* 30.5*  PO2ART 109.0*  --   --   --  132.0* 75.8* 102.0*  HCO3 18.5*  --   --   --  18.5* 17.8* 20.0  TCO2 20  < > 20 21 19.6 18.8 21  O2SAT 98.0  --   --   --  99.4 99.3 98.0  < > = values in this interval not displayed.  PULMONARY A: Acute respiratory failure s/p cardiac arrest Diffuse pulmonary infiltrates: not clear if this is d/t edema or aspiration. No report of vomiting.  P:   - Extubated, patient doing well post extubation, respiratory status improved with diureses.  - D/C all sedation medications. - See ID section. - Will need further diureses.  CARDIOVASCULAR A:  VF arrest Afib  ICM w/ EF 35% Cardiogenic shock Severe AS and mitral valve disease.  Plan: - KVO IVF. - Ok to cardiovert intermittently while being cooled per discussion with cards and and family.  If continuous shocks are needed will not continue that. - Amio per cards, ?change to PO.  RENAL  Recent Labs Lab 05/21/12 0549 05/21/12 1639 05/22/12 0300 05/23/12 0415 05/24/12 0500 05/25/12 0359  NA 136 135 135 142 142 145  K 4.2 4.0 3.5 3.6 3.8 4.2  CL 104 103 103 108 105 105  CO2 18* 21 21 24 28 31   GLUCOSE 114* 89 89 152* 173* 182*  BUN 33* 31* 29* 32* 35* 38*  CREATININE 1.42* 1.46* 1.32* 1.16* 1.07 1.07  CALCIUM 8.4 8.2* 8.1* 8.2* 8.4 8.7  MG 2.2  --  1.8 2.1 1.7 1.9  PHOS 4.3  --  3.3 3.1 3.9 4.4   A:  Chronic renal insufficiency +anion gap metabolic acidosis - 123456: Mild hypomagnesemia P:   - F/u BMET - Replace electrolytes as needed. - Lasix x2 doses. - K and Mg IV replacement.  GASTROINTESTINAL  Recent Labs Lab 05/18/12 1100  05/19/12 0422 05/20/12 0315 05/21/12 0549 05/22/12 0500 05/23/12 0800 05/24/12 0500 05/25/12 0359  AST 210*  --  180* 82* 309*  --   --  110*  --   ALT 149*  --  150* 126* 286*  --    --  209*  --   ALKPHOS 105  --  101 113 99  --   --  113  --   BILITOT 0.2*  --  0.3 0.2* 0.3  --   --  0.4  --   PROT 6.0  --  5.8* 5.9* 5.5*  --   --  6.0  --   ALBUMIN 2.5*  --  2.7* 2.7* 2.4*  --   --  2.7*  --   INR 5.05*  < > 4.73* 7.09* 1.66* 1.39 1.55* 1.62* 1.41  < > = values in this interval not displayed.  A:   GERD Elevated LFTs: likely shock liver.  Lipase elevated - 05/22/2012: Continues to be n.p.o. P:   - PPI - D/C TF. - LFT improving, recheck in two days. - Swallow evaluation today per speech pathology.  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 Left upper extremity is swollen but she is a heparin drip P:  - Heparin drip per pharmacy and transition to coumadin per cardiology (coumadin started). - Goal INR 2-2.5, now 4.7 but patient is being cooled, will not reverse further given artificial valve and the fact that patient is not bleeding. - Duplex of the left upper extremity positive for DVT, continue heparin drip.  INFECTIOUS  Recent Labs Lab 05/18/12 1250 05/19/12 0421 05/19/12 0422 05/20/12 0315  LATICACIDVEN 7.87* 2.6*  --   --   PROCALCITON <0.10  --  4.74 3.59   A:  Possible aspiration  - 05/22/2012: Pro-calcitonin says is localized infection such as pneumonia P:   - Sputum culture negative. - D/C unasyn.  ENDOCRINE A:  DM P:   - SSI protocol   NEUROLOGIC A:  Acute encephalopathy 05/22/2012: Following commands but appears deconditioned and lethargic. P:   - Hypothermia protocol complete. - PRN fentanyl for pain and d/c sedation protocol.  Will transfer to SDU and cardiology to  pick up, PCCM will sign off, please call back if needed.  I have personally obtained a history, examined the patient, evaluated laboratory and imaging results, formulated the assessment and plan and placed orders.  Rush Farmer, M.D. [facility]  Pulmonary/Critical Care Medicine. Pager: [phone]. After hours pager: [phone].

## 2012-05-25 NOTE — Progress Notes (Signed)
Speech Language Pathology Dysphagia Treatment Patient Details Name: [name] MRN: [id number] DOB: Nov 05, 1940 Today's Date: 05/25/2012 Time: [id number] SLP Time Calculation (min): 15 min  Assessment / Plan / Recommendation Clinical Impression  Pt. seen today for possible initaiton of po's versus objective assessment.  Pt. with large bore NGT in place and continues to exhibit indicators of pharyngeal dysphagia requiring need for objective assessment.  Recommned FEES due to pt. in ICU with numerous lines/tubes.  Received orders for FEES and to remove NGT prior to assessment which will be completed next date (try for a.m.) as well as initiate cognitive evaluation.     Diet Recommendation  Continue with Current Diet: NPO    SLP Plan FEES   Pertinent Vitals/Pain none   Swallowing Goals  SLP Swallowing Goals Goal #3: Pt. will exhibit functional oropharyngeal phases of swallow during SLP provided trial at bedside for recommendation of po's verus objective assessment with min verbal cues.  Swallow Study Goal #3 - Progress: Progressing toward goal  General Temperature Spikes Noted: No Respiratory Status: Supplemental O2 delivered via (comment) Behavior/Cognition: Alert;Confused;Pleasant mood;Cooperative;Requires cueing Oral Cavity - Dentition: Adequate natural dentition Patient Positioning: Upright in chair  Oral Cavity - Oral Hygiene Does patient have any of the following "at risk" factors?: Oxygen therapy - cannula, mask, simple oxygen devices Brush patient's teeth BID with toothbrush (using toothpaste with fluoride): Yes Patient is HIGH RISK - Oral Care Protocol followed (see row info): Yes   Dysphagia Treatment Treatment focused on:  (differential diagnosis) Treatment Methods/Modalities: Differential diagnosis;Skilled observation Patient observed directly with PO's: Yes Type of PO's observed: Thin liquids Feeding: Able to feed self;Needs assist;Needs set up Liquids provided via:  Cup Pharyngeal Phase Signs & Symptoms: Delayed throat clear;Suspected delayed swallow initiation Type of cueing: Verbal;Visual Amount of cueing: Moderate       [name] [place] [phone]  05/25/2012

## 2012-05-25 NOTE — Progress Notes (Signed)
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 K 4.4. Mg 2.7. [name] is intubated, sedated and on cooling per protocol. Her history is obtained from the chart. According to her H&P, her husband drove her to [facility] ED and there she required prolonged resuscitation, with multiple drugs and shocks, but pulses returned in about 45 minutes. She was transferred to [facility] ED where she was intubated and sedated. Her initial QT was difficult to assess however, with cooling, she did have marked QT prolongation with recurrent VT/VF.  Alert; answers questions; mild dyspnea; no chest pain  OBJECTIVE  Filed Vitals:   05/25/12 0300 05/25/12 0400 05/25/12 0500 05/25/12 0600  BP: 123/73 132/57 137/62 122/70  Pulse: 103 103 96 98  Temp: 98.4 F (36.9 C)     TempSrc:      Resp:      Height:      Weight:      SpO2: 98% 96% 100% 98%    Intake/Output Summary (Last 24 hours) at 05/25/12 [id number] Last data filed at 05/25/12 0600  Gross per 24 hour  Intake 2299.35 ml  Output   2750  ml  Net -450.65 ml   Weight change:   PHYSICAL EXAM  Head: Normocephalic, atraumatic Neck: Supple Lungs: Mild decreased BS Heart: Tachycardic, II/VI systolic murmur at RUSB Abdomen: Not distended, non-tender Extremities: No edema Neuro: Moves all ext; answers questions appropriately  LABS:  Recent Labs     05/24/12  0500  05/25/12  0359  WBC  11.4*  13.5*  HGB  7.4*  7.5*  HCT  23.4*  24.6*  MCV  81.3  84.5  PLT  329  379   Recent Labs Lab 05/21/12 0549  05/23/12 0415 05/24/12 0500 05/25/12 0359  NA 136  < > 142 142 145  K 4.2  < > 3.6 3.8 4.2  CL 104  < > 108 105 105  CO2 18*  < > 24 28 31   BUN 33*  < > 32* 35* 38*  CREATININE 1.42*  < > 1.16* 1.07 1.07  CALCIUM 8.4  < > 8.2* 8.4 8.7  PROT 5.5*  --   --  6.0  --   BILITOT 0.3  --   --  0.4  --   ALKPHOS 99  --   --  113  --   ALT 286*  --   --  209*  --   AST 309*  --   --  110*  --   LIPASE 238*  --   --   --   --   GLUCOSE 114*  < > 152*  173* 182*  < > = values in this interval not displayed.    Radiology/Studies:  Ct Head Wo Contrast  05/18/2012  *RADIOLOGY REPORT*  Clinical Data: Unresponsive.  Prolonged resuscitation.  Evaluate for hemorrhage.  CT HEAD WITHOUT CONTRAST  Technique:  Contiguous axial images were obtained from the base of the skull through the vertex without contrast.  Comparison: MR brain 06/24/2006 and CT head 06/24/2006.  Findings: No evidence of acute infarct, acute hemorrhage, mass lesion, mass effect or hydrocephalus.  Atrophy.  There is mild periventricular low attenuation.  Tiny remote left basal ganglia lacunar infarct.  Scattered mucosal disease in the paranasal sinuses.  IMPRESSION:  1.  No acute intracranial abnormality. 2.  Atrophy and mild chronic microvascular white matter ischemic changes.   Original Report Authenticated By: Lorin Picket, M.D.    Dg Chest Port 1 View  05/22/2012  *RADIOLOGY REPORT*  Clinical Data: Endotracheal tube position.  PORTABLE CHEST - 1 VIEW   Comparison: 05/21/2012  Findings: Endotracheal tube in place with tip about 5.2 cm above the carina.  Enteric tube has been removed.  Right central venous catheter is unchanged in position.  Cardiac enlargement with normal pulmonary vascularity.  No focal consolidation or airspace disease in the lungs.  No pneumothorax.  No blunting of costophrenic angles.  Stable appearance of postoperative changes in the mediastinum.  IMPRESSION: Endotracheal tube and right central venous catheters are unchanged in position.  Enteric tube was removed.  Cardiac enlargement.  No developing infiltrates.   Original Report Authenticated By: Lucienne Capers, M.D.    Dg Chest Port 1 View  05/21/2012  *RADIOLOGY REPORT*  Clinical Data: The atrial fibrillation.  Ischemic cardiomyopathy. Acute respiratory failure.  Endotracheal tube placement.  PORTABLE CHEST - 1 VIEW  Comparison: 05/20/2012  Findings: Endotracheal tube remains in appropriate position with the tip approximately 4.5 cm above the carina. The nasogastric has been pulled back with the tip now in the mid thoracic esophagus. Right jugular center venous catheter remains in appropriate position.  Cardiomegaly is stable.  Mild perihilar interstitial edema pattern shows no significant change.  A small bilateral pleural effusions are again noted with left retrocardiac atelectasis.  These findings show no significant change.  IMPRESSION:  1. Nasogastric tube been pulled back with the tip now in the mid thoracic esophagus. 2.  Endotracheal tube in appropriate position. 3.  Stable cardiomegaly and mild interstitial edema. 4.  No significant change in bilateral pleural effusions and left retrocardiac atelectasis.   Original Report Authenticated By: Earle Gell, M.D.    Dg Chest Port 1 View  05/20/2012  *RADIOLOGY REPORT*  Clinical Data: Endotracheal tube placement  PORTABLE CHEST - 1 VIEW  Comparison: 05/18/2012  Findings: Endotracheal tube and right internal jugular central venous  catheter are stable.  NG tube placed.  The tip is in the distal esophagus.  Patchy bilateral airspace opacities have improved.  No pneumothorax.  Stable vascular congestion.  IMPRESSION: NG tube placed.  Tip is in the distal esophagus.  Improved bilateral airspace opacities.   Original Report Authenticated By: Marybelle Killings, M.D.    Dg Chest Port 1 View  05/18/2012  *RADIOLOGY REPORT*  Clinical Data:  Intubation, arrest.  PORTABLE CHEST - 1 VIEW  Comparison: 02/21/2012  Findings: Endotracheal tube is 4-5 cm above the carina.  Right central line tip in the SVC.  No pneumothorax.  Diffuse bilateral airspace disease with cardiomegaly.  No effusions.  No acute bony abnormality.  IMPRESSION: Support devices in expected position as  above. No pneumothorax.  Diffuse bilateral airspace disease.   Original Report Authenticated By: Rolm Baptise, M.D.    Dg Abd Portable 1v  05/20/2012  *RADIOLOGY REPORT*  Clinical Data: Vomiting  PORTABLE ABDOMEN - 1 VIEW  Comparison: 03/13/2012  Findings: The tip of the NG tube is at the gastroesophageal junction.  Mild gastric distention with gas.  No disproportionate dilatation of small bowel.  Limited study by technique.  No obvious free intraperitoneal gas.  IMPRESSION: The NG tube tip at the gastroesophageal junction.  No evidence of small bowel obstruction.   Original Report Authenticated By: Marybelle Killings, M.D.     Current Medications:  . ampicillin-sulbactam (UNASYN) IV  1.5 g Intravenous Q6H  . antiseptic oral rinse  15 mL Mouth Rinse Q4H  . carvedilol  3.125 mg Oral BID WC  . feeding supplement (OSMOLITE 1.2 CAL)  1,000 mL Per Tube Q24H  . feeding supplement  30 mL Per Tube QID  . insulin aspart  2-6 Units Subcutaneous Q4H  . insulin glargine  10 Units Subcutaneous Q24H  . pantoprazole (PROTONIX) IV  40 mg Intravenous Q24H  . potassium chloride  40 mEq Oral Daily  . THROMBI-PAD  1 each Topical Once  . Warfarin - Pharmacist Dosing Inpatient   Does not apply q1800     ASSESSMENT AND PLAN:  1. VF arrest  2. Prolonged QT, Tikosyn d/c'd  3. PAF  4. LV dysfunction  5. CAD  6. Valvular heart disease s/p bioprosthetic AVR and MV repair 2008  7. Anemia  8. Superficial venous thrombosis, LUE    Neurological status continues to improve. LUE duplex confirms DVT of the left cephalic and basilic veins. Currently on heparin gtt. Coumadin started. On Unasyn to cover for PNA. Elevate LUE. Continue to monitor. Continue diuresis (Change lasix to 40 BID) and follow renal function/K. Increase coreg to 6.25 BID. Will add ARB later as BP and renal function stabilizes. Continue amio for intermittent a-fib (changes to po). ICD this week or early next week per EP. Supplement MG.   [name] 7:17 AM

## 2012-05-25 NOTE — Progress Notes (Signed)
Physical Therapy Treatment Patient Details Name: [name] MRN: [id number] DOB: 11/03/1940 Today's Date: 05/25/2012 Time: [id number] PT Time Calculation (min): 38 min  PT Assessment / Plan / Recommendation Comments on Treatment Session    Pt admitted with sudden cardiac arrest requring prolonged resucitation, with multiple drugs and shocks, but pulses returned in about 45 minute. Cooling protocol and intubated 3/13->3/18. Progressing today. Cognition still limited, SLP to pursue cognitive evaluation.     Follow Up Recommendations  CIR     Does the patient have the potential to tolerate intense rehabilitation     Barriers to Discharge        Equipment Recommendations  Rolling walker with 5" wheels    Recommendations for Other Services    Frequency Min 3X/week   Plan Discharge plan remains appropriate;Frequency remains appropriate    Precautions / Restrictions Precautions Precautions: Fall Precaution Comments: LUE DVT, on heparin   Pertinent Vitals/Pain Denies pain    Mobility  Bed Mobility Bed Mobility: Supine to Sit;Sitting - Scoot to Edge of Bed Supine to Sit: 3: Mod assist;With rails;HOB elevated Sitting - Scoot to Edge of Bed: 4: Min assist Details for Bed Mobility Assistance: Manual faciliation at hips and trunk.  Significantly increased time for processing and following comands. Transfers Sit to Stand: 3: Mod assist;From bed;With upper extremity assist Stand to Sit: 3: Mod assist;To bed;To chair/3-in-1;With armrests;With upper extremity assist Details for Transfer Assistance: Performed sit<>stand x2.  Manual cues at trunk and hips to facilitate tall posture. Assist also for positioning LUE on RW. Ambulation/Gait Ambulation/Gait Assistance: 3: Mod assist (2 for lines/leads and chair follow) Ambulation Distance (Feet): 5 Feet Assistive device: Rolling walker Ambulation/Gait Assistance Details: facilitation around trunk to support and maintain tall posture as well as  verbal cues throughout for attention to task, able to sustain tall posture for brief seconds before flexing foward and posterior lean Gait Pattern: Step-through pattern;Trunk flexed;Shuffle    Exercises General Exercises - Lower Extremity Ankle Circles/Pumps: AROM;Both;10 reps;Supine Heel Slides: AAROM;Both;10 reps;Supine;Limitations Heel Slides Limitations: decreased graded muscular control   PT Goals Acute Rehab PT Goals PT Goal: Supine/Side to Sit - Progress: Progressing toward goal PT Goal: Sit at Edge Of Bed - Progress: Progressing toward goal PT Goal: Sit to Stand - Progress: Progressing toward goal PT Goal: Stand to Sit - Progress: Progressing toward goal PT Goal: Stand - Progress: Progressing toward goal PT Goal: Ambulate - Progress: Progressing toward goal PT Goal: Perform Home Exercise Program - Progress: Progressing toward goal  Visit Information  Last PT Received On: 05/25/12 Assistance Needed: +2    Subjective Data  Subjective: You got me to the chair.   Cognition  Cognition Overall Cognitive Status: Impaired Area of Impairment: Following commands;Awareness of deficits;Awareness of errors;Problem solving;Attention Arousal/Alertness: Awake/alert Orientation Level: Appears intact for tasks assessed Behavior During Session: [facility] for tasks performed Current Attention Level: Focused Following Commands: Follows one step commands with increased time Awareness of Errors: Assistance required to identify errors made;Assistance required to correct errors made Awareness of Errors - Other Comments: Require tactile/verbal cues to identify sitting balance deficits. Awareness of Deficits: Cues to identify LUE proprioceptive deficit.  Problem Solving: Increased time and step by step cueing for bed mobility.    Balance  Balance Balance Assessed: Yes Static Sitting Balance Static Sitting - Balance Support: Feet supported;Right upper extremity supported Static Sitting - Level of  Assistance: 4: Min assist;3: Mod assist Static Sitting - Comment/# of Minutes: Varying levels of assit while sitting EOB >  15 min.  Pt frequently leans posteriorly as if to lay down and requires assist/verbal cues to lean anteriorly.  Constant verbal cueing for positioning/midline orientation.  Pt does well watching therapist in front of her to visualize positioning and posture.  Static Standing Balance Static Standing - Balance Support: Bilateral upper extremity supported Static Standing - Level of Assistance: 3: Mod assist;2: Max assist Static Standing - Comment/# of Minutes: Initally requiring max assist to gain balance and then mod assist for trunk support and tall posture. 2nd person helpful for safety.  End of Session PT - End of Session Equipment Utilized During Treatment: Gait belt;Oxygen Activity Tolerance: Patient tolerated treatment well Patient left: in chair;with call bell/phone within reach Nurse Communication: Mobility status   GP     [place] 05/25/2012, 2:25 PM

## 2012-05-25 NOTE — Progress Notes (Addendum)
Pt participating therapy.  Note possible FEES in am.  Also, note plans for ICD placement-?next week. Will continue to follow pt for  possible future CIR admit.  Contact for 3/21: Jesse Fall [phone]

## 2012-05-25 NOTE — Evaluation (Signed)
Occupational Therapy Evaluation Patient Details Name: [name] MRN: [id number] DOB: Jul 08, 1940 Today's Date: 05/25/2012 Time: [id number] OT Time Calculation (min): 38 min  OT Assessment / Plan / Recommendation Clinical Impression  Pt admitted with sudden cardiac arrest requring prolonged resucitation, with multiple drugs and shocks, but pulses returned in about 45 minute. Cooling protocol and intubated 3/13->3/18. Will benefit from continued OT services to address below problem list. Recommending CIR to further progress rehab in prep for eventual return home.    OT Assessment  Patient needs continued OT Services    Follow Up Recommendations  CIR    Barriers to Discharge None    Equipment Recommendations  3 in 1 bedside comode    Recommendations for Other Services Rehab consult  Frequency  Min 2X/week    Precautions / Restrictions Precautions Precautions: Fall Precaution Comments: LUE DVT, on heparin   Pertinent Vitals/Pain See vitals     ADL  Grooming: Performed;Brushing hair;Minimal assistance Where Assessed - Grooming: Supported sitting Upper Body Bathing: Simulated;Moderate assistance Where Assessed - Upper Body Bathing: Supported sitting Lower Body Bathing: Simulated;Moderate assistance Where Assessed - Lower Body Bathing: Supported sitting Upper Body Dressing: Simulated;Moderate assistance Where Assessed - Upper Body Dressing: Supported sitting Lower Body Dressing: Performed;Maximal assistance Where Assessed - Lower Body Dressing: Supported sitting Toilet Transfer: Simulated;Moderate assistance Toilet Transfer Method: Sit to Loss adjuster, chartered:  (bed to chair) Equipment Used: Gait belt;Rolling walker Transfers/Ambulation Related to ADLs: Pt mod assist with RW for ambulation (~4ft).  2nd person helpful for lines and chair follow.  Assist for balance and sequencing as well as to maintain tall upright posture. ADL Comments: Pt sat EOB >15 min with  constant assist (varying min-mod assist) and verbal/tactile cues for midline positioning.  Pt frequently unware of LUE positioning when using RW (left hand lets go of RW and begins to fall behind).     OT Diagnosis: Generalized weakness;Cognitive deficits  OT Problem List: Decreased strength;Decreased activity tolerance;Impaired balance (sitting and/or standing);Decreased cognition;Decreased coordination;Decreased knowledge of use of DME or AE;Impaired UE functional use;Impaired sensation OT Treatment Interventions: Self-care/ADL training;Neuromuscular education;DME and/or AE instruction;Therapeutic activities;Patient/family education;Balance training;Cognitive remediation/compensation   OT Goals Acute Rehab OT Goals OT Goal Formulation: With patient/family Time For Goal Achievement: 06/08/12 Potential to Achieve Goals: Good ADL Goals Pt Will Perform Grooming: with supervision;Sitting, chair;Sitting, edge of bed;Unsupported (3 tasks) ADL Goal: Grooming - Progress: Goal set today Pt Will Perform Upper Body Bathing: with supervision;Sitting, chair;Sitting, edge of bed;Unsupported ADL Goal: Upper Body Bathing - Progress: Goal set today Pt Will Perform Lower Body Bathing: with min assist;Sitting, chair;Sitting, edge of bed;Unsupported ADL Goal: Lower Body Bathing - Progress: Goal set today Pt Will Transfer to Toilet: with supervision;Stand pivot transfer;with DME;3-in-1 ADL Goal: Toilet Transfer - Progress: Goal set today Miscellaneous OT Goals Miscellaneous OT Goal #1: Pt will perform bed mobility with superivsion as precursor for EOB ADLs. OT Goal: Miscellaneous Goal #1 - Progress: Goal set today Miscellaneous OT Goal #2: Pt will use LUE to reach for and place 5/5 items in container in order to improve cooridnation and awareness as precursor for ADLs. OT Goal: Miscellaneous Goal #2 - Progress: Goal set today Miscellaneous OT Goal #3: Pt will perform static sitting balance task >10 min at sup  level as precursor for ADL retraining. 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 difficulties         Vision/Perception     Solicitor Overall Cognitive Status: Impaired Area of Impairment: Following commands;Awareness of deficits;Awareness of errors;Problem solving;Attention Arousal/Alertness: Awake/alert Orientation Level: Appears intact for tasks assessed Behavior During Session: [facility] for tasks performed Current Attention Level: Focused Following Commands: Follows one step commands with increased time Awareness of Errors: Assistance required to identify errors made;Assistance required to correct errors made Awareness of Errors - Other Comments: Require tactile/verbal cues to identify sitting balance deficits. Awareness of Deficits: Cues to identify LUE proprioceptive deficit.  Problem Solving: Increased time and step by step cueing for bed mobility.    Extremity/Trunk Assessment Right Upper Extremity Assessment RUE ROM/Strength/Tone: WFL for tasks assessed RUE Sensation: WFL - Light Touch;WFL - Proprioception RUE Coordination: WFL - gross/fine motor Left Upper Extremity Assessment LUE ROM/Strength/Tone: Deficits LUE ROM/Strength/Tone Deficits: LUE DVT noted in chart. Decreased awareness/possible propioceptive deficits during mobility.  LUE Sensation: Deficits LUE Sensation Deficits: Reports sensation intact but seems to demonstrate neglect/possible proprioceptive deficits. LUE  Coordination: Deficits LUE Coordination Deficits: Decreased control of gross motor coordination, often lifts arm up while lying in supine and lets it drop to bed. Trunk Assessment Trunk Assessment: Kyphotic     Mobility Bed Mobility Bed Mobility: Supine to Sit;Sitting - Scoot to Edge of Bed Supine to Sit: 3: Mod assist;With rails;HOB elevated Sitting - Scoot to Edge of Bed: 4: Min assist Details for Bed Mobility Assistance: Manual faciliation at hips and trunk.  Significantly increased time for processing and following comands. Transfers Transfers: Sit to Stand;Stand to Sit Sit to Stand: 3: Mod assist;From bed;With upper extremity assist Stand to Sit: 3: Mod assist;To bed;To chair/3-in-1;With armrests;With upper extremity assist Details for Transfer Assistance: Performed sit<>stand x2.  Manual cues at trunk and hips to facilitate tall posture. Assist also for positioning LUE on RW.     Exercise     Balance Balance Balance Assessed: Yes Static Sitting Balance Static Sitting - Balance Support: Feet supported;Right upper extremity supported Static Sitting - Level of Assistance: 4: Min assist;3: Mod assist Static Sitting - Comment/# of Minutes: Varying levels of assit while sitting EOB >15 min.  Pt frequently leans posteriorly as if to lay down and requires assist/verbal cues to lean anteriorly.  Constant verbal cueing for positioning/midline orientation.  Pt does well watching therapist in front of her to visualize positioning and posture.  Static Standing Balance Static Standing - Balance Support: Bilateral upper extremity supported Static Standing - Level of Assistance: 3: Mod assist;2: Max assist Static Standing - Comment/# of Minutes: Initally requiring max assist to gain balance and then mod assist for trunk support and tall posture. 2nd person helpful for safety.   End of Session OT - End of Session Equipment Utilized During Treatment: Gait belt Activity Tolerance: Patient  tolerated treatment well Patient left: in chair;with call bell/phone within reach;with family/visitor present Nurse Communication: Mobility status  GO    05/25/2012 Darrol Jump OTR/L Pager [phone] Office [phone]  Darrol Jump 05/25/2012, 1:27 PM

## 2012-05-25 NOTE — Progress Notes (Signed)
ANTICOAGULANT CONSULT NOTE - FOLLOW UP  Pharmacy Consult for heparin  Indication: atrial fibrillation and AVR + MV repair   No Known Allergies  Patient Measurements: Height: 5\' 6"  (167.6 cm) Weight: 185 lb 10 oz (84.2 kg) IBW/kg (Calculated) : 59.3 Heparin Dosing Weight: 77kg   Vital Signs: Temp: 98.4 F (36.9 C) (03/20 0300) BP: 132/57 mmHg (03/20 0400) Pulse Rate: 103 (03/20 0400) Intake/Output from previous day: 03/19 0701 - 03/20 0700 In: 2055.2 [I.V.:1045.2; NG/GT:760; IV Piggyback:250] Out: 2750 [Urine:2750] Intake/Output from this shift: Total I/O In: 802.8 [I.V.:422.8; NG/GT:280; IV Piggyback:100] Out: 900 [Urine:900]  Labs:  Recent Labs  05/22/12 0500  05/23/12 0415 05/23/12 0800 05/23/12 1600 05/24/12 0500 05/25/12 0359  HGB  --   < > 7.8*  --   --  7.4* 7.5*  HCT  --   --  24.1*  --   --  23.4* 24.6*  PLT  --   --  335  --   --  329 379  APTT 30  --   --  153*  --  >200*  --   LABPROT 16.7*  --   --  18.1*  --  18.7* 16.9*  INR 1.39  --   --  1.55*  --  1.62* 1.41  HEPARINUNFRC  --   < >  --  0.62 0.56 0.61 0.74*  CREATININE  --   --  1.16*  --   --  1.07  --   < > = values in this interval not displayed.  Estimated Creatinine Clearance: 52.8 ml/min (by C-G formula based on Cr of 1.07). No results found for this basename: VANCOTROUGH, Corlis Leak, VANCORANDOM, [place], GENTPEAK, GENTRANDOM, TOBRATROUGH, TOBRAPEAK, TOBRARND, AMIKACINPEAK, AMIKACINTROU, AMIKACIN,  in the last 72 hours   Microbiology: Recent Results (from the past 720 hour(s))  MRSA PCR SCREENING     Status: None   Collection Time    05/18/12  4:20 PM      Result Value Range Status   MRSA by PCR NEGATIVE  NEGATIVE Final   Comment:            The GeneXpert MRSA Assay (FDA     approved for NASAL specimens     only), is one component of a     comprehensive MRSA colonization     surveillance program. It is not     intended to diagnose MRSA     infection nor to guide or     monitor  treatment for     MRSA infections.  CULTURE, RESPIRATORY (NON-EXPECTORATED)     Status: None   Collection Time    05/22/12 12:30 PM      Result Value Range Status   Specimen Description OTHER   Final   Special Requests ETT SUCTION   Final   Gram Stain     Final   Value: MODERATE WBC PRESENT,BOTH PMN AND MONONUCLEAR     RARE SQUAMOUS EPITHELIAL CELLS PRESENT     NO ORGANISMS SEEN   Culture     Final   Value: RARE GRAM NEGATIVE RODS     FEW CANDIDA ALBICANS   Report Status PENDING   Incomplete    Anti-infectives   Start     Dose/Rate Route Frequency Ordered Stop   05/18/12 1930  ampicillin-sulbactam (UNASYN) 1.5 g in sodium chloride 0.9 % 50 mL IVPB     1.5 g 100 mL/hr over 30 Minutes Intravenous Every 6 hours 05/18/12 1317     05/18/12 1330  ampicillin-sulbactam (UNASYN) 1.5 g in sodium chloride 0.9 % 50 mL IVPB     1.5 g 100 mL/hr over 30 Minutes Intravenous To Emergency Dept 05/18/12 1317 05/18/12 1409     . sodium chloride 10 mL/hr at 05/25/12 0400  . amiodarone (NEXTERONE PREMIX) 360 mg/200 mL dextrose 30.06 mg/hr (05/25/12 0400)  . heparin 1,600 Units/hr (05/25/12 0400)  . isoproterenol (ISUPREL) infusion Stopped (05/20/12 0257)  . phenylephrine (NEO-SYNEPHRINE) Adult infusion Stopped (05/23/12 0459)   Assessment: 72 year old female admitted s/p VF arrest and now s/p artic sun. Pt on coumadin PTA for atrial fibrillation, now on heparin. Home coumadin dose: 2mg  MWF and 4mg  TTSS. Patient for ICD late this week or early next week.   INR on 3/15 supratherapeutic at 7.02, which was reversed with 1mg  IV vitamin K. INR is 1.41. Heparin level (0.74) is above-goal on 1600 units/hr. No bleeding per RN.   Goal of Therapy:  INR 2.5-3.5 (per Dr. Alver Fisher note 3/13) Heparin level 0.3-0.7 units/ml Monitor platelets by anticoagulation protocol: Yes   Plan:  1. Decrease IV heparin to 1500 units/hr.  2. Heparin level in 8 hours.    Otila Back 05/25/2012,4:38  AM

## 2012-05-26 LAB — BASIC METABOLIC PANEL
BUN: 50 mg/dL — ABNORMAL HIGH (ref 6–23)
Chloride: 106 mEq/L (ref 96–112)
GFR calc Af Amer: 60 mL/min — ABNORMAL LOW (ref >90)
GFR calc non Af Amer: 52 mL/min — ABNORMAL LOW (ref >90)
Potassium: 4.5 mEq/L (ref 3.5–5.1)
Sodium: 148 mEq/L — ABNORMAL HIGH (ref 135–145)

## 2012-05-26 LAB — GLUCOSE, CAPILLARY
Glucose-Capillary: 129 mg/dL — ABNORMAL HIGH (ref 70–99)
Glucose-Capillary: 144 mg/dL — ABNORMAL HIGH (ref 70–99)
Glucose-Capillary: 145 mg/dL — ABNORMAL HIGH (ref 70–99)
Glucose-Capillary: 167 mg/dL — ABNORMAL HIGH (ref 70–99)

## 2012-05-26 LAB — PROTIME-INR
INR: 1.42 (ref 0.00–1.49)
Prothrombin Time: 17 seconds — ABNORMAL HIGH (ref 11.6–15.2)

## 2012-05-26 LAB — CBC
Hemoglobin: 7.3 g/dL — ABNORMAL LOW (ref 12.0–15.0)
MCHC: 30.5 g/dL (ref 30.0–36.0)
RBC: 2.8 MIL/uL — ABNORMAL LOW (ref 3.87–5.11)

## 2012-05-26 LAB — HEPARIN LEVEL (UNFRACTIONATED): Heparin Unfractionated: 0.81 IU/mL — ABNORMAL HIGH (ref 0.30–0.70)

## 2012-05-26 LAB — MAGNESIUM: Magnesium: 2.2 mg/dL (ref 1.5–2.5)

## 2012-05-26 LAB — PHOSPHORUS: Phosphorus: 4.9 mg/dL — ABNORMAL HIGH (ref 2.3–4.6)

## 2012-05-26 MED ORDER — WARFARIN SODIUM 4 MG PO TABS
4.0000 mg | ORAL_TABLET | Freq: Once | ORAL | Status: AC
  Administered 2012-05-26: 4 mg via ORAL
  Filled 2012-05-26: qty 1

## 2012-05-26 MED ORDER — CARVEDILOL 12.5 MG PO TABS
12.5000 mg | ORAL_TABLET | Freq: Once | ORAL | Status: AC
  Administered 2012-05-26: 12.5 mg via ORAL
  Filled 2012-05-26: qty 1

## 2012-05-26 MED ORDER — BIOTENE DRY MOUTH MT LIQD
15.0000 mL | Freq: Two times a day (BID) | OROMUCOSAL | Status: DC
  Administered 2012-05-26 – 2012-06-08 (×25): 15 mL via OROMUCOSAL

## 2012-05-26 MED ORDER — CARVEDILOL 12.5 MG PO TABS
12.5000 mg | ORAL_TABLET | Freq: Two times a day (BID) | ORAL | Status: DC
  Administered 2012-05-26 – 2012-05-29 (×7): 12.5 mg via ORAL
  Filled 2012-05-26 (×11): qty 1

## 2012-05-26 NOTE — Progress Notes (Signed)
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 K 4.4. Mg 2.7. [name] is intubated, sedated and on cooling per protocol. Her history is obtained from the chart. According to her H&P, her husband drove her to [facility] ED and there she required prolonged resuscitation, with multiple drugs and shocks, but pulses returned in about 45 minutes. She was transferred to [facility] ED where she was intubated and sedated. Her initial QT was difficult to assess however, with cooling, she did have marked QT prolongation with recurrent VT/VF.  Alert; answers questions; no dyspnea; no chest pain  OBJECTIVE  Filed Vitals:   05/26/12 0500 05/26/12 0600 05/26/12 0700 05/26/12 0800  BP: 113/49 117/54 132/61   Pulse: 68 72 78   Temp:    98.4 F (36.9 C)  TempSrc:    Oral  Resp:      Height:      Weight:      SpO2: 100% 100% 96%     Intake/Output Summary (Last 24 hours) at 05/26/12 [id number] Last data filed at 05/26/12 0800  Gross per 24 hour  Intake 1504.93 ml  Output   1900 ml  Net  -395.07 ml   Weight change:   PHYSICAL EXAM  Head: Normocephalic, atraumatic Neck: Supple Lungs: Mild decreased BS Heart: Tachycardic, II/VI systolic murmur at RUSB Abdomen: Not distended, non-tender Extremities: No edema Neuro: Moves all ext; answers questions appropriately  LABS:  Recent Labs     05/25/12  0359  05/26/12  0500  WBC  13.5*  13.1*  HGB  7.5*  7.3*  HCT  24.6*  23.9*  MCV  84.5  85.4  PLT  379  375   Recent Labs Lab 05/21/12 0549  05/24/12 0500 05/25/12 0359 05/26/12 0500  NA 136  < > 142 145 148*  K 4.2  < > 3.8 4.2 4.5  CL 104  < > 105 105 106  CO2 18*  < > 28 31 35*  BUN 33*  < > 35* 38* 50*  CREATININE 1.42*  < > 1.07 1.07 1.05  CALCIUM 8.4  < > 8.4 8.7 8.8  PROT 5.5*  --  6.0  --   --   BILITOT 0.3  --  0.4  --   --   ALKPHOS 99  --  113  --   --   ALT 286*  --  209*  --   --   AST 309*  --  110*  --   --   LIPASE 238*  --   --   --   --   GLUCOSE 114*  < > 173*  182* 125*  < > = values in this interval not displayed.    Radiology/Studies:  Ct Head Wo Contrast  05/18/2012  *RADIOLOGY REPORT*  Clinical Data: Unresponsive.  Prolonged resuscitation.  Evaluate for hemorrhage.  CT HEAD WITHOUT CONTRAST  Technique:  Contiguous axial images were obtained from the base of the skull through the vertex without contrast.  Comparison: MR brain 06/24/2006 and CT head 06/24/2006.  Findings: No evidence of acute infarct, acute hemorrhage, mass lesion, mass effect or hydrocephalus.  Atrophy.  There is mild periventricular low attenuation.  Tiny remote left basal ganglia lacunar infarct.  Scattered mucosal disease in the paranasal sinuses.  IMPRESSION:  1.  No acute intracranial abnormality. 2.  Atrophy and mild chronic microvascular white matter ischemic changes.   Original Report Authenticated By: Lorin Picket, M.D.    Dg Chest Port 1 View  05/22/2012  *RADIOLOGY REPORT*  Clinical Data: Endotracheal tube position.  PORTABLE CHEST - 1 VIEW  Comparison:  05/21/2012  Findings: Endotracheal tube in place with tip about 5.2 cm above the carina.  Enteric tube has been removed.  Right central venous catheter is unchanged in position.  Cardiac enlargement with normal pulmonary vascularity.  No focal consolidation or airspace disease in the lungs.  No pneumothorax.  No blunting of costophrenic angles.  Stable appearance of postoperative changes in the mediastinum.  IMPRESSION: Endotracheal tube and right central venous catheters are unchanged in position.  Enteric tube was removed.  Cardiac enlargement.  No developing infiltrates.   Original Report Authenticated By: Lucienne Capers, M.D.    Dg Chest Port 1 View  05/21/2012  *RADIOLOGY REPORT*  Clinical Data: The atrial fibrillation.  Ischemic cardiomyopathy. Acute respiratory failure.  Endotracheal tube placement.  PORTABLE CHEST - 1 VIEW  Comparison: 05/20/2012  Findings: Endotracheal tube remains in appropriate position with the tip approximately 4.5 cm above the carina. The nasogastric has been pulled back with the tip now in the mid thoracic esophagus. Right jugular center venous catheter remains in appropriate position.  Cardiomegaly is stable.  Mild perihilar interstitial edema pattern shows no significant change.  A small bilateral pleural effusions are again noted with left retrocardiac atelectasis.  These findings show no significant change.  IMPRESSION:  1. Nasogastric tube been pulled back with the tip now in the mid thoracic esophagus. 2.  Endotracheal tube in appropriate position. 3.  Stable cardiomegaly and mild interstitial edema. 4.  No significant change in bilateral pleural effusions and left retrocardiac atelectasis.   Original Report Authenticated By: Earle Gell, M.D.    Dg Chest Port 1 View  05/20/2012  *RADIOLOGY REPORT*  Clinical Data: Endotracheal tube placement  PORTABLE CHEST - 1 VIEW  Comparison: 05/18/2012  Findings: Endotracheal tube and right internal jugular central venous catheter are  stable.  NG tube placed.  The tip is in the distal esophagus.  Patchy bilateral airspace opacities have improved.  No pneumothorax.  Stable vascular congestion.  IMPRESSION: NG tube placed.  Tip is in the distal esophagus.  Improved bilateral airspace opacities.   Original Report Authenticated By: Marybelle Killings, M.D.    Dg Chest Port 1 View  05/18/2012  *RADIOLOGY REPORT*  Clinical Data:  Intubation, arrest.  PORTABLE CHEST - 1 VIEW  Comparison: 02/21/2012  Findings: Endotracheal tube is 4-5 cm above the carina.  Right central line tip in the SVC.  No pneumothorax.  Diffuse bilateral airspace disease with cardiomegaly.  No effusions.  No acute bony abnormality.  IMPRESSION: Support devices in expected position as  above. No pneumothorax.  Diffuse bilateral airspace disease.   Original Report Authenticated By: Rolm Baptise, M.D.    Dg Abd Portable 1v  05/20/2012  *RADIOLOGY REPORT*  Clinical Data: Vomiting  PORTABLE ABDOMEN - 1 VIEW  Comparison: 03/13/2012  Findings: The tip of the NG tube is at the gastroesophageal junction.  Mild gastric distention with gas.  No disproportionate dilatation of small bowel.  Limited study by technique.  No obvious free intraperitoneal gas.  IMPRESSION: The NG tube tip at the gastroesophageal junction.  No evidence of small bowel obstruction.   Original Report Authenticated By: Marybelle Killings, M.D.     Current Medications:  . amiodarone  400 mg Per NG tube BID  . antiseptic oral rinse  15 mL Mouth Rinse Q4H  . carvedilol  6.25 mg Oral BID WC  . feeding supplement (OSMOLITE 1.2 CAL)  1,000 mL Per Tube Q24H  . feeding supplement  30 mL Per Tube QID  . furosemide  40 mg Intravenous BID  . insulin aspart  2-6 Units Subcutaneous Q4H  . insulin glargine  10 Units Subcutaneous Q24H  . pantoprazole (PROTONIX) IV  40 mg Intravenous Q24H  . potassium chloride  40 mEq Oral Daily  . THROMBI-PAD  1 each Topical Once  . warfarin  4 mg Oral ONCE-1800  . Warfarin - Pharmacist Dosing  Inpatient   Does not apply q1800    ASSESSMENT AND PLAN:  1. VF arrest - patient with NSVT on telemetry last PM. 2. Prolonged QT, Tikosyn d/c'd  3. PAF  4. LV dysfunction  5. CAD  6. Valvular heart disease s/p bioprosthetic AVR and MV repair 2008  7. Anemia  8. Superficial venous thrombosis, LUE    Neurological status continues to improve. LUE duplex confirms DVT of the left cephalic and basilic veins. Currently on heparin gtt. Coumadin started. On Unasyn to cover for PNA. Elevate LUE. Continue to monitor. Patient becoming prerenal and sodium increasing; hold lasix and KCL. Increase coreg to 12.5 BID. Will add ARB later as BP and renal function stabilizes. Continue amio for intermittent a-fib. ICD prior to DC.    [name] 9:38 AM

## 2012-05-26 NOTE — Plan of Care (Signed)
Problem: Phase III Progression Outcomes Goal: Other Phase III Outcomes/Goals Outcome: Progressing +DVT with Heparin gtt/ coumadin therapy

## 2012-05-26 NOTE — Progress Notes (Signed)
ANTICOAGULANT CONSULT NOTE - FOLLOW UP  Pharmacy Consult for heparin  Indication: atrial fibrillation and AVR + MV repair   No Known Allergies  Patient Measurements: Height: 5\' 6"  (167.6 cm) Weight: 173 lb 8 oz (78.7 kg) IBW/kg (Calculated) : 59.3 Heparin Dosing Weight: 77kg   Vital Signs: Temp: 98.4 F (36.9 C) (03/21 0300) BP: 117/54 mmHg (03/21 0600) Pulse Rate: 72 (03/21 0600) Intake/Output from previous day: 03/20 0701 - 03/21 0700 In: 1720.2 [I.V.:700.2; NG/GT:920; IV Piggyback:100] Out: 2250 [Urine:2250] Intake/Output from this shift: Total I/O In: 635 [I.V.:275; NG/GT:360] Out: 650 [Urine:650]  Labs:  Recent Labs  05/23/12 0800  05/24/12 0500 05/25/12 0359 05/25/12 1230 05/26/12 0500  HGB  --   < > 7.4* 7.5*  --  7.3*  HCT  --   --  23.4* 24.6*  --  23.9*  PLT  --   --  329 379  --  375  APTT 153*  --  >200*  --   --   --   LABPROT 18.1*  --  18.7* 16.9*  --  17.0*  INR 1.55*  --  1.62* 1.41  --  1.42  HEPARINUNFRC 0.62  < > 0.61 0.74* 0.53 0.81*  CREATININE  --   --  1.07 1.07  --   --   < > = values in this interval not displayed.  Estimated Creatinine Clearance: 51.1 ml/min (by C-G formula based on Cr of 1.07). No results found for this basename: VANCOTROUGH, VANCOPEAK, VANCORANDOM, GENTTROUGH, GENTPEAK, GENTRANDOM, TOBRATROUGH, TOBRAPEAK, TOBRARND, AMIKACINPEAK, AMIKACINTROU, AMIKACIN,  in the last 72 hours    Assessment: 72 year old female admitted s/p VF arrest and now s/p artic sun. Pt on coumadin PTA for atrial fibrillation. On 3/15 INR was supratherapeutic at 7.02, which was reversed with 1mg  IV vitamin K.  Home coumadin dose: 2mg  MWF and 4mg  TTSS.   INR today is 1.42. Heparin level (0.81) is above-goal on 1500 units/hr. No bleeding per RN. Patient noted for ICD late this week or early next week.   Goal of Therapy:  INR 2.5-3.5 (per Dr. Alver Fisher note 3/13) Heparin level 0.3-0.7 units/ml Monitor platelets by anticoagulation protocol: Yes    Plan:  1. Decrease IV heparin to 1350 units/hr 2. Heparin level in 8 hours.  3. Coumadin 4mg  po today.  Raye Sorrow, Pharm D 05/26/2012 6:40 AM

## 2012-05-26 NOTE — Progress Notes (Signed)
ANTICOAGULANT CONSULT NOTE - FOLLOW UP  Pharmacy Consult for heparin  Indication: atrial fibrillation and AVR + MV repair   No Known Allergies  Patient Measurements: Height: 5\' 6"  (167.6 cm) Weight: 173 lb 8 oz (78.7 kg) IBW/kg (Calculated) : 59.3  Heparin Dosing Weight: 77kg   Vital Signs: Temp: 99.1 F (37.3 C) (03/21 1224) Temp src: Oral (03/21 1224) BP: 139/69 mmHg (03/21 1150) Pulse Rate: 96 (03/21 1224) Intake/Output from previous day: 03/20 0701 - 03/21 0700 In: 1720.2 [I.V.:700.2; NG/GT:920; IV Piggyback:100] Out: 2250 [Urine:2250] Intake/Output from this shift: Total I/O In: 48.1 [I.V.:48.1] Out: -   Labs:  Recent Labs  05/24/12 0500 05/25/12 0359 05/25/12 1230 05/26/12 0500 05/26/12 1528  HGB 7.4* 7.5*  --  7.3*  --   HCT 23.4* 24.6*  --  23.9*  --   PLT 329 379  --  375  --   APTT >200*  --   --   --   --   LABPROT 18.7* 16.9*  --  17.0*  --   INR 1.62* 1.41  --  1.42  --   HEPARINUNFRC 0.61 0.74* 0.53 0.81* 0.65  CREATININE 1.07 1.07  --  1.05  --     Estimated Creatinine Clearance: 52.1 ml/min (by C-G formula based on Cr of 1.05). No results found for this basename: VANCOTROUGH, VANCOPEAK, VANCORANDOM, GENTTROUGH, GENTPEAK, GENTRANDOM, TOBRATROUGH, TOBRAPEAK, TOBRARND, AMIKACINPEAK, AMIKACINTROU, AMIKACIN,  in the last 72 hours    Assessment: 72 year old female admitted s/p VF arrest and now s/p artic sun. Pt on coumadin PTA for atrial fibrillation. On 3/15 INR was supratherapeutic at 7.02, which was reversed with 1mg  IV vitamin K.  Home coumadin dose: 2mg  MWF and 4mg  TTSS.   INR today is 1.42. Heparin level (0.65) is therapeutic on 1350 units/hr. No bleeding per RN. Patient noted for ICD late this week or early next week.   Goal of Therapy:  INR 2.5-3.5 (per Dr. Alver Fisher note 3/13) Heparin level 0.3-0.7 units/ml Monitor platelets by anticoagulation protocol: Yes   Plan:  1. Continue IV heparin at 1350 units/hr 2. Heparin level and CBC in  AM.    Heide Guile, PharmD, [facility] Clinical Pharmacist Pager [phone]   05/26/2012 4:15 PM

## 2012-05-26 NOTE — Procedures (Addendum)
Objective Swallowing Evaluation: Fiberoptic Endoscopic Evaluation of Swallowing  Patient Details  Name: [name] MRN: [id number] Date of Birth: 05-23-1940  Today's Date: 05/26/2012 Time:  -1115    Past Medical History:  Past Medical History  Diagnosis Date  . Anemia   . Atrial fibrillation     A.fib/flutter: s/p MAZE 2008, DCCV 2011, on coumadin; previously on flecainide, but dc'd due to worsening EF  . Diverticulitis   . Valvular heart disease     bicuspid AV/severe AS, mitral regurgitation, & aneurysm of the ascending thoracic aorta (s/p AV replacement, MV repair, resection and grafting of the ascending thoracic aortic aneurysm 2008  . CVA (cerebral vascular accident)     2008 - felt due to oscillating calcium on aortic valve  . Carcinoma in situ of vulva   . Fibroid   . Ovarian cyst, left 2008-2009  . Osteoporosis   . Diabetes mellitus   . Hypertension   . GERD (gastroesophageal reflux disease)   . Chronic systolic CHF (congestive heart failure)     EF 35%  . Ischemic cardiomyopathy     EF 35%  . Coronary artery disease     Occluded LM 2/2 previous aortic root surgery; s/p 2v CABG 2010 LIMA to LAD, SVG to OM; Cath 03/11/12 patent SVG to OM, atretic LIMA to LAD, patent RCA, occluded native LM, EF 35%   . Shortness of breath   . Arthritis     OSTEO  . Fracture of lumbar spine    Past Surgical History:  Past Surgical History  Procedure Laterality Date  . Foot surgery      removed neuroma  . Aortic root replacement  2008    homograft  . Mv repair  2008    due to severe MR  . Resection and grafting of ascending thoracic aortic    . Aneurysm and left sided maze  2008  . Coronary artery bypass graft  4/10    LIMA to LAD, SVG to OM  . Wide excision of vulva      CA INSITU   HPI:  72 yr old with sudden cardiac arrest, team estimates 45 minutes of ACLS to ROSC, intubated 3/12-3/18.  PMH:  ICM, AF, severe AS and MR, CVA, DM, HTN, GERD.  CXR 3/17 pulmonary vascular  congestion and pleura effusion.  Bedside swallow assessment and follow up with subtle s/s aspiration and FEES recommended.  Pt. removed her NGT earlier this a.m.     Assessment / Plan / Recommendation Clinical Impression  Dysphagia Diagnosis: Mild oral phase dysphagia;Mild pharyngeal phase dysphagia Clinical impression: Pt. exhibited mild oral dysphagia with delayed oral prep, manipulation and transit of solid texture.  Pharyngeal deficits are mild and involve sensory tract primarily leading to delayed swallow initiation to pyriform sinsuses with thin water.  One episode of flash penetration before initiating swallow with thin.  Intermittent trace-min pyriform sinus residue.  Aspiration risk appears mild-moderate.  Pt.'s cognitive deficits (decreased attention and distractability) presently increase risk.  Pt.'s husband present and therapeutic intervention involved education on swallow precautions and plans for advancement from ground solids next week.  LP recommends Dys 2 diet texture with thin liquids, straws ok, meds whole in applesauce).  ST will continue to follow for dysphagia and cognitive deficits (see full assessment).    Treatment Recommendation  Therapy as outlined in treatment plan below    Diet Recommendation Dysphagia 2 (Fine chop);Thin liquid   Liquid Administration via: Cup;Straw Medication Administration: Whole meds with puree  Supervision: Patient able to self feed;Full supervision/cueing for compensatory strategies Compensations: Slow rate;Small sips/bites Postural Changes and/or Swallow Maneuvers: Seated upright 90 degrees    Other  Recommendations Oral Care Recommendations: Oral care BID   Follow Up Recommendations  Inpatient Rehab    Frequency and Duration min 2x/week  2 weeks   Pertinent Vitals/Pain none    SLP Swallow Goals Patient will utilize recommended strategies during swallow to increase swallowing safety with: Moderate cueing Goal #3: Pt. will exhibit  functional oropharyngeal phases of swallow during SLP provided trial at bedside for recommendation of po's verus objective assessment with min verbal cues.  Swallow Study Goal #3 - Progress: Met      Reason for Referral Objectively evaluate swallowing function   Oral Phase Oral Preparation/Oral Phase Oral Phase: Impaired Oral - Solids Oral - Regular: Delayed oral transit;Weak lingual manipulation   Pharyngeal Phase Pharyngeal Phase Pharyngeal Phase: Impaired Pharyngeal - Honey Pharyngeal - Honey Teaspoon: Pharyngeal residue - pyriform sinuses;Reduced laryngeal elevation Pharyngeal - Honey Cup: Pharyngeal residue - pyriform sinuses;Reduced laryngeal elevation Pharyngeal - Nectar Pharyngeal - Nectar Teaspoon: Pharyngeal residue - pyriform sinuses;Reduced laryngeal elevation Pharyngeal - Nectar Cup: Pharyngeal residue - pyriform sinuses;Reduced laryngeal elevation Pharyngeal - Thin Pharyngeal - Thin Teaspoon: Delayed swallow initiation;Premature spillage to valleculae Pharyngeal - Thin Cup: Delayed swallow initiation;Premature spillage to valleculae;Premature spillage to pyriform sinuses;Pharyngeal residue - pyriform sinuses;Penetration/Aspiration before swallow (trace-min residue) Penetration/Aspiration details (thin cup): Material enters airway, remains ABOVE vocal cords and not ejected out Pharyngeal - Thin Straw: Delayed swallow initiation;Premature spillage to valleculae;Premature spillage to pyriform sinuses Pharyngeal - Solids Pharyngeal - Puree: Pharyngeal residue - pyriform sinuses  Cervical Esophageal Phase    GO    Cervical Esophageal Phase Cervical Esophageal Phase:  (no backflow observed)         Houston Siren M.Ed [employer] [phone]  05/26/2012

## 2012-05-26 NOTE — Evaluation (Signed)
Speech Language Pathology Evaluation Patient Details Name: [name] MRN: [id number] DOB: 08-Aug-1940 Today's Date: 05/26/2012 Time: [id number] SLP Time Calculation (min): 22 min  Problem List:  Patient Active Problem List  Diagnosis  . VITAMIN B12 DEFICIENCY  . VITAMIN D DEFICIENCY  . HYPERLIPIDEMIA  . UNSPECIFIED IRON DEFICIENCY ANEMIA  . Essential hypertension, benign  . Atrial fibrillation  . CHOLELITHIASIS  . Celiac disease  . Mitral valve disease  . Encounter for long-term (current) use of anticoagulants  . Carcinoma in situ of vulva  . Fibroid  . Ovarian cyst, left  . Osteoporosis, idiopathic  . Type II or unspecified type diabetes mellitus without mention of complication, uncontrolled  . GERD (gastroesophageal reflux disease)  . Ischemic cardiomyopathy  . Back pain, thoracic  . Urinary frequency  . Bruit  . 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 Hypomagnesemia  . Ventricular fibrillation  . Acute respiratory failure  . Warfarin-induced coagulopathy  . Acute encephalopathy  . Metabolic acidosis  . Sustained ventricular fibrillation  . Sudden cardiac arrest   Past Medical History:  Past Medical History  Diagnosis Date  . Anemia   . Atrial fibrillation     A.fib/flutter: s/p MAZE 2008, DCCV 2011, on coumadin; previously on flecainide, but dc'd due to worsening EF  . Diverticulitis   . Valvular heart disease     bicuspid AV/severe AS, mitral regurgitation, & aneurysm of the ascending thoracic aorta (s/p AV replacement, MV repair, resection and grafting of the ascending thoracic aortic aneurysm 2008  . CVA (cerebral vascular accident)     2008 - felt due to oscillating calcium on aortic valve  . Carcinoma in situ of vulva   . Fibroid   . Ovarian cyst, left 2008-2009  . Osteoporosis   . Diabetes mellitus   . Hypertension   . GERD (gastroesophageal reflux disease)   . Chronic systolic CHF (congestive heart  failure)     EF 35%  . Ischemic cardiomyopathy     EF 35%  . Coronary artery disease     Occluded LM 2/2 previous aortic root surgery; s/p 2v CABG 2010 LIMA to LAD, SVG to OM; Cath 03/11/12 patent SVG to OM, atretic LIMA to LAD, patent RCA, occluded native LM, EF 35%   . Shortness of breath   . Arthritis     OSTEO  . Fracture of lumbar spine    Past Surgical History:  Past Surgical History  Procedure Laterality Date  . Foot surgery      removed neuroma  . Aortic root replacement  2008    homograft  . Mv repair  2008    due to severe MR  . Resection and grafting of ascending thoracic aortic    . Aneurysm and left sided maze  2008  . Coronary artery bypass graft  4/10    LIMA to LAD, SVG to OM  . Wide excision of vulva      CA INSITU   HPI:  72 yr old with sudden cardiac arrest, team estimates 45 minutes of ACLS to ROSC, intubated 3/12-3/18.  PMH:  ICM, AF, severe AS and MR, CVA, DM, HTN, GERD.  CXR 3/17 pulmonary vascular congestion and pleura effusion.  Bedside swallow assessment and follow up with subtle s/s aspiration and FEES recommended.  SLP requested order for cognitiive assessment.   Assessment / Plan / Recommendation Clinical Impression  Cognitive assessment completed.  Pt. is becoming more aware of her surroundings and current situation.  She exhibits delayed processing ability and requires additional response time.  Additional impairments are moderate-severe and in the areas of sustained attention (distracted by edematous left hand, however she verbalized that she was distracted demonstrating mild emergent awareness), problem solving (significant difficulty with simple calculations), consistent emergent and anticipatory awareness.  Pt. would benefit from ST cognitive intervention to progress to performing ADL's with least assistance possible.         SLP Assessment  Patient needs continued Speech Lanaguage Pathology Services    Follow Up Recommendations  Inpatient Rehab     Frequency and Duration min 2x/week  2 weeks   Pertinent Vitals/Pain none   SLP Goals  SLP Goals Potential to Achieve Goals: Good Potential Considerations: Severity of impairments Progress/Goals/Alternative treatment plan discussed with pt/caregiver and they: Agree SLP Goal #1: Pt. will demonstrate sustained attention to basic activities/speaker with mild question cues. SLP Goal #2: Pt. will state cognitive/physical impairments and consequences with moderate verbal assist. SLP Goal #3: Pt. will solve simple/basic problems provided moderate verbal/visual cues. SLP Goal #4: Pt. will utilize environmental cues to increase temporal orientation with min verbal assist.  SLP Evaluation Prior Functioning  Cognitive/Linguistic Baseline: Within functional limits Type of Home: House Lives With: Spouse Available Help at Discharge: Family;Available 24 hours/day Vocation: Retired   Associate Professor  Overall Cognitive Status: Impaired Arousal/Alertness: Awake/alert Orientation Level: Oriented to person;Oriented to place;Oriented to situation;Disoriented to time Attention: Sustained Sustained Attention: Impaired Sustained Attention Impairment: Verbal basic;Functional basic Memory: Impaired Memory Impairment: Storage deficit;Retrieval deficit;Decreased recall of new information;Decreased short term memory;Prospective memory Decreased Short Term Memory: Verbal basic;Functional basic Awareness: Impaired Awareness Impairment: Intellectual impairment;Emergent impairment;Anticipatory impairment Problem Solving: Impaired Problem Solving Impairment: Verbal basic;Functional basic Executive Function: Self Correcting;Self Monitoring;Initiating;Organizing;Decision Making Organizing: Impaired Safety/Judgment: Impaired    Comprehension  Auditory Comprehension Overall Auditory Comprehension: Appears within functional limits for tasks assessed Yes/No Questions: Within Functional Limits Commands: Within  Functional Limits Conversation: Simple Interfering Components: Attention;Processing speed;Working Field seismologist: Tour manager: Not tested Reading Comprehension Reading Status: Not tested    Expression Expression Primary Mode of Expression: Verbal Verbal Expression Initiation: No impairment Level of Generative/Spontaneous Verbalization: Conversation Repetition: No impairment Naming: No impairment Pragmatics: Impairment Impairments: Abnormal affect Interfering Components: Attention Written Expression Dominant Hand: Right Written Expression: Not tested   Oral / Motor Oral Motor/Sensory Function Overall Oral Motor/Sensory Function: Appears within functional limits for tasks assessed Motor Speech Overall Motor Speech: Appears within functional limits for tasks assessed Respiration: Within functional limits Phonation: Normal Resonance: Within functional limits Articulation: Within functional limitis Intelligibility: Intelligible Motor Planning: Witnin functional limits   GO     [name] [employer] [phone]  05/26/2012

## 2012-05-27 ENCOUNTER — Inpatient Hospital Stay (HOSPITAL_COMMUNITY): Payer: Medicare Other

## 2012-05-27 LAB — BASIC METABOLIC PANEL
BUN: 49 mg/dL — ABNORMAL HIGH (ref 6–23)
Chloride: 105 mEq/L (ref 96–112)
GFR calc Af Amer: 59 mL/min — ABNORMAL LOW (ref >90)
Potassium: 3.6 mEq/L (ref 3.5–5.1)
Sodium: 149 mEq/L — ABNORMAL HIGH (ref 135–145)

## 2012-05-27 LAB — GLUCOSE, CAPILLARY
Glucose-Capillary: 81 mg/dL (ref 70–99)
Glucose-Capillary: 87 mg/dL (ref 70–99)

## 2012-05-27 LAB — HEPARIN LEVEL (UNFRACTIONATED)
Heparin Unfractionated: 0.76 IU/mL — ABNORMAL HIGH (ref 0.30–0.70)
Heparin Unfractionated: 0.4 IU/mL (ref 0.30–0.70)

## 2012-05-27 LAB — CBC
HCT: 23.4 % — ABNORMAL LOW (ref 36.0–46.0)
Platelets: 414 10*3/uL — ABNORMAL HIGH (ref 150–400)
RDW: 15.4 % (ref 11.5–15.5)
WBC: 11.7 10*3/uL — ABNORMAL HIGH (ref 4.0–10.5)

## 2012-05-27 LAB — PROTIME-INR
INR: 1.83 — ABNORMAL HIGH (ref 0.00–1.49)
Prothrombin Time: 20.5 seconds — ABNORMAL HIGH (ref 11.6–15.2)

## 2012-05-27 MED ORDER — POTASSIUM CHLORIDE CRYS ER 20 MEQ PO TBCR
40.0000 meq | EXTENDED_RELEASE_TABLET | Freq: Once | ORAL | Status: DC
  Filled 2012-05-27: qty 2

## 2012-05-27 MED ORDER — INSULIN ASPART 100 UNIT/ML ~~LOC~~ SOLN
0.0000 [IU] | Freq: Three times a day (TID) | SUBCUTANEOUS | Status: DC
  Administered 2012-05-28: 2 [IU] via SUBCUTANEOUS
  Administered 2012-05-29: 1 [IU] via SUBCUTANEOUS
  Administered 2012-05-29: 2 [IU] via SUBCUTANEOUS
  Administered 2012-05-29 – 2012-05-30 (×2): 5 [IU] via SUBCUTANEOUS
  Administered 2012-05-30 – 2012-05-31 (×2): 1 [IU] via SUBCUTANEOUS
  Administered 2012-05-31: 3 [IU] via SUBCUTANEOUS
  Administered 2012-05-31 – 2012-06-01 (×2): 1 [IU] via SUBCUTANEOUS
  Administered 2012-06-01: 5 [IU] via SUBCUTANEOUS
  Administered 2012-06-01 – 2012-06-02 (×2): 3 [IU] via SUBCUTANEOUS
  Administered 2012-06-02: 2 [IU] via SUBCUTANEOUS
  Administered 2012-06-02: 5 [IU] via SUBCUTANEOUS
  Administered 2012-06-03: 3 [IU] via SUBCUTANEOUS
  Administered 2012-06-03: 2 [IU] via SUBCUTANEOUS
  Administered 2012-06-03: 1 [IU] via SUBCUTANEOUS
  Administered 2012-06-04 (×2): 3 [IU] via SUBCUTANEOUS
  Administered 2012-06-04 – 2012-06-06 (×5): 1 [IU] via SUBCUTANEOUS
  Administered 2012-06-06: 5 [IU] via SUBCUTANEOUS
  Administered 2012-06-07 (×2): 2 [IU] via SUBCUTANEOUS
  Administered 2012-06-07: 3 [IU] via SUBCUTANEOUS
  Administered 2012-06-08 – 2012-06-09 (×4): 1 [IU] via SUBCUTANEOUS
  Administered 2012-06-09: 3 [IU] via SUBCUTANEOUS

## 2012-05-27 MED ORDER — FUROSEMIDE 10 MG/ML IJ SOLN
40.0000 mg | Freq: Once | INTRAMUSCULAR | Status: AC
  Administered 2012-05-27: 40 mg via INTRAVENOUS
  Filled 2012-05-27: qty 4

## 2012-05-27 MED ORDER — WARFARIN SODIUM 4 MG PO TABS
4.0000 mg | ORAL_TABLET | Freq: Once | ORAL | Status: AC
  Filled 2012-05-27: qty 1

## 2012-05-27 MED ORDER — POTASSIUM CHLORIDE CRYS ER 10 MEQ PO TBCR
40.0000 meq | EXTENDED_RELEASE_TABLET | Freq: Once | ORAL | Status: AC
  Administered 2012-05-27: 40 meq via ORAL
  Filled 2012-05-27 (×2): qty 4

## 2012-05-27 NOTE — Progress Notes (Signed)
Patient Name: [name] Date of Encounter: 05/27/2012     Principal Problem:   Sudden cardiac arrest Active Problems:   Atrial fibrillation   Mitral valve disease   Type II or unspecified type diabetes mellitus without mention of complication, uncontrolled   GERD (gastroesophageal reflux disease)   Ischemic cardiomyopathy   Valvular heart disease-bicuspid aortic valve s/p Mechanical replacement//MV repair   Ventricular fibrillation   Acute respiratory failure   Warfarin-induced coagulopathy   Acute encephalopathy   Metabolic acidosis   Sustained ventricular fibrillation    SUBJECTIVE:  [name] is a 72 year old woman with h/o severe AS, mitral regurgitation, aneurysm of the ascending thoracic aorta (status post aortic valve replacement, mitral valve repair, resection and grafting of the ascending thoracic aortic aneurysm on Jul 07, 2006), CAD s/p CABG, AF s/p recent Tikosyn initiation and LV dysfunction who presented yesterday after out-of-hospital VF arrest. K 4.4. Mg 2.7. [name] is intubated, sedated and on cooling per protocol. Her history is obtained from the chart. According to her H&P, her husband drove her to [facility] ED and there she required prolonged resuscitation, with multiple drugs and shocks, but pulses returned in about 45 minutes. She was transferred to [facility] ED where she was intubated and sedated. Her initial QT was difficult to assess however, with cooling, she did have marked QT prolongation with recurrent VT/VF.  Alert; answers questions; no dyspnea; no chest pain  OBJECTIVE  Filed Vitals:   05/27/12 0400 05/27/12 0440 05/27/12 0500 05/27/12 0742  BP: 111/44  119/63   Pulse: 60  74   Temp: 97.9 F (36.6 C)   98.2 F (36.8 C)  TempSrc: Oral   Oral  Resp: 18  38   Height:      Weight:  175 lb 4.3 oz (79.5 kg)    SpO2: 100%  97%     Intake/Output Summary (Last 24 hours) at 05/27/12 0805 Last data filed at 05/27/12 0500  Gross per 24 hour  Intake   613.5 ml  Output   1950 ml  Net -1336.5 ml   Weight change: 1 lb 12.2 oz (0.8 kg)  PHYSICAL EXAM  Head: Normocephalic, atraumatic Neck: Supple Lungs: Mild decreased BS bases Heart: Tachycardic, II/VI systolic murmur at RUSB Abdomen: Not distended, non-tender Extremities: Trace edema Neuro: Moves all ext; answers questions appropriately  LABS:  Recent Labs     05/26/12  0500  05/27/12  0436  WBC  13.1*  11.7*  HGB  7.3*  7.2*  HCT  23.9*  23.4*  MCV  85.4  85.7  PLT  375  414*   Recent Labs Lab 05/21/12 0549  05/24/12 0500 05/25/12 0359 05/26/12 0500 05/27/12 0436  NA 136  < > 142 145 148* 149*  K 4.2  < > 3.8 4.2 4.5 3.6  CL 104  < > 105 105 106 105  CO2 18*  < > 28 31 35* 34*  BUN 33*  < > 35* 38* 50* 49*  CREATININE 1.42*  < > 1.07 1.07 1.05 1.07  CALCIUM 8.4  < > 8.4 8.7 8.8 8.8  PROT 5.5*  --  6.0  --   --   --   BILITOT 0.3  --  0.4  --   --   --   ALKPHOS 99  --  113  --   --   --   ALT 286*  --  209*  --   --   --  AST 309*  --  110*  --   --   --   LIPASE 238*  --   --   --   --   --   GLUCOSE 114*  < > 173* 182* 125* 85  < > = values in this interval not displayed.    Radiology/Studies:  Ct Head Wo Contrast  05/18/2012  *RADIOLOGY REPORT*  Clinical Data: Unresponsive.  Prolonged resuscitation.  Evaluate for hemorrhage.  CT HEAD WITHOUT CONTRAST  Technique:  Contiguous axial images were obtained from the base of the skull through the vertex without contrast.  Comparison: MR brain 06/24/2006 and CT head 06/24/2006.  Findings: No evidence of acute infarct, acute hemorrhage, mass lesion, mass effect or hydrocephalus.  Atrophy.  There is mild periventricular low attenuation.  Tiny remote left basal ganglia lacunar infarct.  Scattered mucosal disease in the paranasal sinuses.  IMPRESSION:  1.  No acute intracranial abnormality. 2.  Atrophy and mild chronic microvascular white matter ischemic changes.   Original Report Authenticated By: Lorin Picket, M.D.      Dg Chest Port 1 View  05/22/2012  *RADIOLOGY REPORT*  Clinical Data: Endotracheal tube position.  PORTABLE CHEST - 1 VIEW  Comparison: 05/21/2012  Findings: Endotracheal tube in place with tip about 5.2 cm above the carina.  Enteric tube has been removed.  Right central venous catheter is unchanged in position.  Cardiac enlargement with normal pulmonary vascularity.  No focal consolidation or airspace disease in the lungs.  No pneumothorax.  No blunting of costophrenic angles.  Stable appearance of postoperative changes in the mediastinum.  IMPRESSION: Endotracheal tube and right central venous catheters are unchanged in position.  Enteric tube was removed.  Cardiac enlargement.  No developing infiltrates.   Original Report Authenticated By: Lucienne Capers, M.D.    Dg Chest Port 1 View  05/21/2012  *RADIOLOGY REPORT*  Clinical Data: The atrial fibrillation.  Ischemic cardiomyopathy. Acute respiratory failure.  Endotracheal tube placement.  PORTABLE CHEST - 1 VIEW  Comparison: 05/20/2012  Findings: Endotracheal tube remains in appropriate position with the tip approximately 4.5 cm above the carina. The nasogastric has been pulled back with the tip now in the mid thoracic esophagus. Right jugular center venous catheter remains in appropriate position.  Cardiomegaly is stable.  Mild perihilar interstitial edema pattern shows no significant change.  A small bilateral pleural effusions are again noted with left retrocardiac atelectasis.  These findings show no significant change.  IMPRESSION:  1. Nasogastric tube been pulled back with the tip now in the mid thoracic esophagus. 2.  Endotracheal tube in appropriate position. 3.  Stable cardiomegaly and mild interstitial edema. 4.  No significant change in bilateral pleural effusions and left retrocardiac atelectasis.   Original Report Authenticated By: Earle Gell, M.D.    Dg Chest Port 1 View  05/20/2012  *RADIOLOGY REPORT*  Clinical Data: Endotracheal tube  placement  PORTABLE CHEST - 1 VIEW  Comparison: 05/18/2012  Findings: Endotracheal tube and right internal jugular central venous catheter are stable.  NG tube placed.  The tip is in the distal esophagus.  Patchy bilateral airspace opacities have improved.  No pneumothorax.  Stable vascular congestion.  IMPRESSION: NG tube placed.  Tip is in the distal esophagus.  Improved bilateral airspace opacities.   Original Report Authenticated By: Marybelle Killings, M.D.    Dg Chest Port 1 View  05/18/2012  *RADIOLOGY REPORT*  Clinical Data:  Intubation, arrest.  PORTABLE CHEST - 1 VIEW  Comparison: 02/21/2012  Findings:  Endotracheal tube is 4-5 cm above the carina.  Right central line tip in the SVC.  No pneumothorax.  Diffuse bilateral airspace disease with cardiomegaly.  No effusions.  No acute bony abnormality.  IMPRESSION: Support devices in expected position as above. No pneumothorax.  Diffuse bilateral airspace disease.   Original Report Authenticated By: Rolm Baptise, M.D.    Dg Abd Portable 1v  05/20/2012  *RADIOLOGY REPORT*  Clinical Data: Vomiting  PORTABLE ABDOMEN - 1 VIEW  Comparison: 03/13/2012  Findings: The tip of the NG tube is at the gastroesophageal junction.  Mild gastric distention with gas.  No disproportionate dilatation of small bowel.  Limited study by technique.  No obvious free intraperitoneal gas.  IMPRESSION: The NG tube tip at the gastroesophageal junction.  No evidence of small bowel obstruction.   Original Report Authenticated By: Marybelle Killings, M.D.     Current Medications:  . amiodarone  400 mg Per NG tube BID  . antiseptic oral rinse  15 mL Mouth Rinse BID  . carvedilol  12.5 mg Oral BID WC  . insulin aspart  2-6 Units Subcutaneous Q4H  . insulin glargine  10 Units Subcutaneous Q24H  . pantoprazole (PROTONIX) IV  40 mg Intravenous Q24H  . THROMBI-PAD  1 each Topical Once  . [COMPLETED] warfarin  4 mg Oral ONCE-1800  . Warfarin - Pharmacist Dosing Inpatient   Does not apply q1800     ASSESSMENT AND PLAN:  1. VF arrest - patient with NSVT on telemetry last PM. 2. Prolonged QT, Tikosyn d/c'd  3. PAF  4. LV dysfunction  5. CAD  6. Valvular heart disease s/p bioprosthetic AVR and MV repair 2008  7. Anemia  8. Superficial venous thrombosis, LUE    Neurological status improved. LUE duplex confirms DVT of the left cephalic and basilic veins. Currently on heparin gtt. Coumadin continues. Elevate LUE. Continue to monitor. Continue coreg 12.5 BID. Will add ARB later as BP and renal function stabilizes. Continue amio for intermittent a-fib/VT. ICD prior to DC. Diminished BS this AM; repeat chest xray; lasix 40 mg IV x 1. DC foley and IJ central line. Mobilize. Diet initiated.   [name] 8:05 AM

## 2012-05-27 NOTE — Progress Notes (Signed)
ANTICOAGULATION CONSULT NOTE - Follow Up Consult  Pharmacy Consult for heparin Indication: afib and AVR + MV repair  No Known Allergies  Patient Measurements: Height: 5\' 6"  (167.6 cm) Weight: 175 lb 4.3 oz (79.5 kg) IBW/kg (Calculated) : 59.3 Heparin Dosing Weight: 77 kg  Vital Signs: Temp: 97.9 F (36.6 C) (03/22 1130) Temp src: Oral (03/22 1130) BP: 119/63 mmHg (03/22 0500) Pulse Rate: 74 (03/22 0500)  Labs:  Recent Labs  05/25/12 0359  05/26/12 0500 05/26/12 1528 05/27/12 0436 05/27/12 1538  HGB 7.5*  --  7.3*  --  7.2*  --   HCT 24.6*  --  23.9*  --  23.4*  --   PLT 379  --  375  --  414*  --   LABPROT 16.9*  --  17.0*  --  20.5*  --   INR 1.41  --  1.42  --  1.83*  --   HEPARINUNFRC 0.74*  < > 0.81* 0.65 0.76* 0.40  CREATININE 1.07  --  1.05  --  1.07  --   < > = values in this interval not displayed.  Estimated Creatinine Clearance: 51.3 ml/min (by C-G formula based on Cr of 1.07).   Medications:  Scheduled:  . amiodarone  400 mg Per NG tube BID  . antiseptic oral rinse  15 mL Mouth Rinse BID  . carvedilol  12.5 mg Oral BID WC  . [COMPLETED] furosemide  40 mg Intravenous Once  . insulin aspart  0-9 Units Subcutaneous TID WC  . insulin glargine  10 Units Subcutaneous Q24H  . pantoprazole (PROTONIX) IV  40 mg Intravenous Q24H  . potassium chloride  40 mEq Oral Once  . potassium chloride  40 mEq Oral Once  . THROMBI-PAD  1 each Topical Once  . [COMPLETED] warfarin  4 mg Oral ONCE-1800  . [COMPLETED] warfarin  4 mg Oral ONCE-1800  . Warfarin - Pharmacist Dosing Inpatient   Does not apply q1800  . [DISCONTINUED] feeding supplement  30 mL Per Tube QID  . [DISCONTINUED] insulin aspart  2-6 Units Subcutaneous Q4H   Infusions:  . sodium chloride 10 mL/hr at 05/27/12 1100  . heparin 1,200 Units/hr (05/27/12 1100)  . isoproterenol (ISUPREL) infusion Stopped (05/20/12 0257)  . phenylephrine (NEO-SYNEPHRINE) Adult infusion Stopped (05/23/12 0459)     Assessment: 72 yo female with afib and AVR + MV repair is currently on therapeutic heparin.  Heparin level was 0.4. Goal of Therapy:  Heparin level 0.3-0.7 units/ml Monitor platelets by anticoagulation protocol: Yes   Plan:  1) Continue heparin at 1200 units/hr 2) Heparin level and CBC in am  [name], Tsz-Yin 05/27/2012,4:12 PM

## 2012-05-27 NOTE — Progress Notes (Addendum)
ANTICOAGULANT CONSULT NOTE - FOLLOW UP  Pharmacy Consult for heparin  Indication: atrial fibrillation and AVR + MV repair   No Known Allergies  Patient Measurements: Height: 5\' 6"  (167.6 cm) Weight: 175 lb 4.3 oz (79.5 kg) IBW/kg (Calculated) : 59.3  Heparin Dosing Weight: 77kg   Vital Signs: Temp: 97.9 F (36.6 C) (03/22 0400) Temp src: Oral (03/22 0400) BP: 119/63 mmHg (03/22 0500) Pulse Rate: 74 (03/22 0500) Intake/Output from previous day: 03/21 0701 - 03/22 0700 In: 661.6 [P.O.:120; I.V.:541.6] Out: 1950 [Urine:1950] Intake/Output from this shift:    Labs:  Recent Labs  05/25/12 0359  05/26/12 0500 05/26/12 1528 05/27/12 0436  HGB 7.5*  --  7.3*  --  7.2*  HCT 24.6*  --  23.9*  --  23.4*  PLT 379  --  375  --  414*  LABPROT 16.9*  --  17.0*  --  20.5*  INR 1.41  --  1.42  --  1.83*  HEPARINUNFRC 0.74*  < > 0.81* 0.65 0.76*  CREATININE 1.07  --  1.05  --  1.07  < > = values in this interval not displayed.   Assessment: 72 year old female admitted s/p VF arrest and now s/p artic sun. Pt on coumadin PTA for atrial fibrillation. On 3/15 INR was supratherapeutic at 7.02, which was reversed with 1mg  IV vitamin K.  Home coumadin dose: 2mg  MWF and 4mg  TTSS.   INR today is 1.83, as amiodarone accumulates dose will need to be decreased with close f/u on discharge.  Heparin level (0.76) is supratherapeutic on 1350 units/hr. No bleeding per RN, plts normal. Patient noted for ICD late this week or early next week.   Goal of Therapy:  INR 2.5-3.5 (per Dr. Alver Fisher note 3/13) Heparin level 0.3-0.7 units/ml Monitor platelets by anticoagulation protocol: Yes   Plan:  1. Decrease IV heparin to 1200 units/hr 2. Heparin level at 1600. 3.  Warfarin 4mg  PO once today 4. Daily CBC and INR   Loralee Pacas. Gala Romney.D. Clinical Pharmacist Pager [phone] Phone [phone] 05/27/2012 7:24 AM

## 2012-05-28 LAB — PROTIME-INR: INR: 2.47 — ABNORMAL HIGH (ref 0.00–1.49)

## 2012-05-28 LAB — CBC
HCT: 26.1 % — ABNORMAL LOW (ref 36.0–46.0)
Hemoglobin: 7.9 g/dL — ABNORMAL LOW (ref 12.0–15.0)
MCH: 25.6 pg — ABNORMAL LOW (ref 26.0–34.0)
MCHC: 30.3 g/dL (ref 30.0–36.0)
MCV: 84.7 fL (ref 78.0–100.0)
RDW: 15.4 % (ref 11.5–15.5)

## 2012-05-28 LAB — BASIC METABOLIC PANEL
BUN: 43 mg/dL — ABNORMAL HIGH (ref 6–23)
Calcium: 9 mg/dL (ref 8.4–10.5)
GFR calc Af Amer: 60 mL/min — ABNORMAL LOW (ref >90)
GFR calc non Af Amer: 52 mL/min — ABNORMAL LOW (ref >90)
Glucose, Bld: 129 mg/dL — ABNORMAL HIGH (ref 70–99)
Potassium: 3.8 mEq/L (ref 3.5–5.1)

## 2012-05-28 LAB — GLUCOSE, CAPILLARY
Glucose-Capillary: 117 mg/dL — ABNORMAL HIGH (ref 70–99)
Glucose-Capillary: 244 mg/dL — ABNORMAL HIGH (ref 70–99)
Glucose-Capillary: 189 mg/dL — ABNORMAL HIGH (ref 70–99)

## 2012-05-28 LAB — HEPARIN LEVEL (UNFRACTIONATED): Heparin Unfractionated: 0.31 IU/mL (ref 0.30–0.70)

## 2012-05-28 MED ORDER — PANTOPRAZOLE SODIUM 40 MG PO TBEC
40.0000 mg | DELAYED_RELEASE_TABLET | Freq: Every day | ORAL | Status: DC
  Administered 2012-05-28 – 2012-06-09 (×13): 40 mg via ORAL
  Filled 2012-05-28 (×12): qty 1

## 2012-05-28 MED ORDER — WARFARIN SODIUM 2 MG PO TABS
2.0000 mg | ORAL_TABLET | Freq: Once | ORAL | Status: AC
  Administered 2012-05-28: 2 mg via ORAL
  Filled 2012-05-28: qty 1

## 2012-05-28 MED ORDER — LOSARTAN POTASSIUM 25 MG PO TABS
25.0000 mg | ORAL_TABLET | Freq: Every day | ORAL | Status: DC
  Administered 2012-05-28 – 2012-05-29 (×2): 25 mg via ORAL
  Filled 2012-05-28 (×3): qty 1

## 2012-05-28 MED ORDER — POTASSIUM CHLORIDE CRYS ER 20 MEQ PO TBCR
40.0000 meq | EXTENDED_RELEASE_TABLET | Freq: Once | ORAL | Status: AC
  Administered 2012-05-28: 40 meq via ORAL
  Filled 2012-05-28: qty 2

## 2012-05-28 MED ORDER — AMIODARONE HCL 200 MG PO TABS
400.0000 mg | ORAL_TABLET | Freq: Every day | ORAL | Status: DC
  Administered 2012-05-28 – 2012-05-29 (×2): 400 mg via NASOGASTRIC
  Filled 2012-05-28 (×3): qty 2

## 2012-05-28 MED ORDER — FUROSEMIDE 40 MG PO TABS
40.0000 mg | ORAL_TABLET | Freq: Every day | ORAL | Status: DC
  Administered 2012-05-28: 40 mg via ORAL
  Filled 2012-05-28 (×2): qty 1

## 2012-05-28 NOTE — Progress Notes (Signed)
ANTICOAGULATION CONSULT NOTE - Follow Up Consult  Pharmacy Consult for heparin Indication: afib and AVR + MV repair and RUE DVT  No Known Allergies  Patient Measurements: Height: 5\' 6"  (167.6 cm) Weight: 171 lb 1.2 oz (77.6 kg) IBW/kg (Calculated) : 59.3 Heparin Dosing Weight: 77 kg  Vital Signs: Temp: 98.5 F (36.9 C) (03/23 1200) Temp src: Oral (03/23 1200) BP: 157/67 mmHg (03/23 0800) Pulse Rate: 59 (03/23 1500)  Labs:  Recent Labs  05/26/12 0500  05/27/12 0436 05/27/12 1538 05/28/12 0435 05/28/12 1457  HGB 7.3*  --  7.2*  --  7.9*  --   HCT 23.9*  --  23.4*  --  26.1*  --   PLT 375  --  414*  --  524*  --   LABPROT 17.0*  --  20.5*  --  25.6*  --   INR 1.42  --  1.83*  --  2.47*  --   HEPARINUNFRC 0.81*  < > 0.76* 0.40 0.25* 0.31  CREATININE 1.05  --  1.07  --  1.06  --   < > = values in this interval not displayed.  Estimated Creatinine Clearance: 51.2 ml/min (by C-G formula based on Cr of 1.06).   Medications:  Scheduled:  . amiodarone  400 mg Per NG tube Daily  . antiseptic oral rinse  15 mL Mouth Rinse BID  . carvedilol  12.5 mg Oral BID WC  . furosemide  40 mg Oral Daily  . insulin aspart  0-9 Units Subcutaneous TID WC  . insulin glargine  10 Units Subcutaneous Q24H  . losartan  25 mg Oral Daily  . pantoprazole  40 mg Oral Daily  . [COMPLETED] potassium chloride  40 mEq Oral Once  . THROMBI-PAD  1 each Topical Once  . warfarin  2 mg Oral ONCE-1800  . [COMPLETED] warfarin  4 mg Oral ONCE-1800  . Warfarin - Pharmacist Dosing Inpatient   Does not apply q1800  . [DISCONTINUED] amiodarone  400 mg Per NG tube BID  . [DISCONTINUED] pantoprazole (PROTONIX) IV  40 mg Intravenous Q24H  . [DISCONTINUED] potassium chloride  40 mEq Oral Once   Infusions:  . sodium chloride 10 mL/hr at 05/28/12 1500  . heparin 1,300 Units/hr (05/28/12 1500)  . isoproterenol (ISUPREL) infusion Stopped (05/20/12 0257)  . phenylephrine (NEO-SYNEPHRINE) Adult infusion Stopped  (05/23/12 0459)    Assessment: 72 yo female with afib and AVR + MV repair and RUE DVT is currently on therapeutic heparin.  Heparin level was 0.31. Goal of Therapy:  Heparin level 0.3-0.7 units/ml Monitor platelets by anticoagulation protocol: Yes   Plan:  1) Cont heparin at 1300 units/hr 2) heparin level and CBC in am  Demetria Iwai, Tsz-Yin 05/28/2012,3:54 PM

## 2012-05-28 NOTE — Progress Notes (Signed)
ANTICOAGULATION CONSULT NOTE - Follow Up Consult  Pharmacy Consult for heparin/Coumadin Indication: h/o Afib/AVR, new RUE DVT  No Known Allergies  Patient Measurements: Height: 5\' 6"  (167.6 cm) Weight: 171 lb 1.2 oz (77.6 kg) IBW/kg (Calculated) : 59.3 Heparin Dosing Weight: 77 kg  Vital Signs: Temp: 98 F (36.7 C) (03/23 0334) Temp src: Oral (03/23 0334) BP: 142/63 mmHg (03/23 0334) Pulse Rate: 84 (03/23 0334)  Labs:  Recent Labs  05/26/12 0500  05/27/12 0436 05/27/12 1538 05/28/12 0435  HGB 7.3*  --  7.2*  --  7.9*  HCT 23.9*  --  23.4*  --  26.1*  PLT 375  --  414*  --  524*  LABPROT 17.0*  --  20.5*  --  25.6*  INR 1.42  --  1.83*  --  2.47*  HEPARINUNFRC 0.81*  < > 0.76* 0.40 0.25*  CREATININE 1.05  --  1.07  --  1.06  < > = values in this interval not displayed.  Estimated Creatinine Clearance: 51.2 ml/min (by C-G formula based on Cr of 1.06).  Assessment: 72 yo female with h/o afib/AVR, and RUE DVT for anticoagulation.  Day #1/2 for VTE overlap, though INR not quite at goal for AVR/Afib  Goal of Therapy:  INR 2.5-3.5 Heparin level 0.3-0.7 units/ml Monitor platelets by anticoagulation protocol: Yes   Plan:  Increase Heparin 1300 units/hr Coumadin 2 mg today  Caryl Pina 05/28/2012,6:39 AM

## 2012-05-28 NOTE — Progress Notes (Signed)
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 K 4.4. Mg 2.7. [name] is intubated, sedated and on cooling per protocol. Her history is obtained from the chart. According to her H&P, her husband drove her to [facility] ED and there she required prolonged resuscitation, with multiple drugs and shocks, but pulses returned in about 45 minutes. She was transferred to [facility] ED where she was intubated and sedated. Her initial QT was difficult to assess however, with cooling, she did have marked QT prolongation with recurrent VT/VF.  Alert; answers questions; no dyspnea; no chest pain  OBJECTIVE  Filed Vitals:   05/27/12 2200 05/27/12 2300 05/28/12 0334 05/28/12 0700  BP: 131/54 133/60 142/63 148/67  Pulse: 82 77 84   Temp:  98.6 F (37 C) 98 F (36.7 C) 98.5 F (36.9 C)  TempSrc:  Oral Oral Oral  Resp: 33 35 28   Height:      Weight:   171 lb 1.2 oz (77.6 kg)   SpO2: 98% 97% 99%     Intake/Output Summary (Last 24 hours) at 05/28/12 0805 Last data filed at 05/28/12  0600  Gross per 24 hour  Intake    584 ml  Output    425 ml  Net    159 ml   Weight change: -4 lb 3 oz (-1.9 kg)  PHYSICAL EXAM  Head: Normocephalic, atraumatic Neck: Supple Lungs: Mild crackles bases Heart: Tachycardic, II/VI systolic murmur at RUSB Abdomen: Not distended, non-tender Extremities: No edema Neuro: Moves all ext; answers questions appropriately  LABS:  Recent Labs     05/27/12  0436  05/28/12  0435  WBC  11.7*  12.0*  HGB  7.2*  7.9*  HCT  23.4*  26.1*  MCV  85.7  84.7  PLT  414*  524*   Recent Labs Lab 05/24/12 0500  05/26/12 0500 05/27/12 0436 05/28/12 0435  NA 142  < > 148* 149* 146*  K 3.8  < > 4.5 3.6 3.8  CL 105  < > 106 105 102  CO2 28  < > 35* 34* 36*  BUN 35*  < > 50* 49* 43*  CREATININE 1.07  < > 1.05 1.07 1.06  CALCIUM 8.4  < > 8.8 8.8 9.0  PROT 6.0  --   --   --   --   BILITOT 0.4  --   --   --   --   ALKPHOS 113  --   --   --   --   ALT 209*  --   --   --   --   AST 110*  --   --   --   --  GLUCOSE 173*  < > 125* 85 129*  < > = values in this interval not displayed.    Radiology/Studies:  Ct Head Wo Contrast  05/18/2012  *RADIOLOGY REPORT*  Clinical Data: Unresponsive.  Prolonged resuscitation.  Evaluate for hemorrhage.  CT HEAD WITHOUT CONTRAST  Technique:  Contiguous axial images were obtained from the base of the skull through the vertex without contrast.  Comparison: MR brain 06/24/2006 and CT head 06/24/2006.  Findings: No evidence of acute infarct, acute hemorrhage, mass lesion, mass effect or hydrocephalus.  Atrophy.  There is mild periventricular low attenuation.  Tiny remote left basal ganglia lacunar infarct.  Scattered mucosal disease in the paranasal sinuses.  IMPRESSION:  1.  No acute intracranial abnormality. 2.  Atrophy and mild chronic microvascular white matter ischemic changes.   Original Report Authenticated By: Lorin Picket, M.D.    Dg Chest Port 1 View  05/22/2012  *RADIOLOGY REPORT*  Clinical Data:  Endotracheal tube position.  PORTABLE CHEST - 1 VIEW  Comparison: 05/21/2012  Findings: Endotracheal tube in place with tip about 5.2 cm above the carina.  Enteric tube has been removed.  Right central venous catheter is unchanged in position.  Cardiac enlargement with normal pulmonary vascularity.  No focal consolidation or airspace disease in the lungs.  No pneumothorax.  No blunting of costophrenic angles.  Stable appearance of postoperative changes in the mediastinum.  IMPRESSION: Endotracheal tube and right central venous catheters are unchanged in position.  Enteric tube was removed.  Cardiac enlargement.  No developing infiltrates.   Original Report Authenticated By: Lucienne Capers, M.D.    Dg Chest Port 1 View  05/21/2012  *RADIOLOGY REPORT*  Clinical Data: The atrial fibrillation.  Ischemic cardiomyopathy. Acute respiratory failure.  Endotracheal tube placement.  PORTABLE CHEST - 1 VIEW  Comparison: 05/20/2012  Findings: Endotracheal tube remains in appropriate position with the tip approximately 4.5 cm above the carina. The nasogastric has been pulled back with the tip now in the mid thoracic esophagus. Right jugular center venous catheter remains in appropriate position.  Cardiomegaly is stable.  Mild perihilar interstitial edema pattern shows no significant change.  A small bilateral pleural effusions are again noted with left retrocardiac atelectasis.  These findings show no significant change.  IMPRESSION:  1. Nasogastric tube been pulled back with the tip now in the mid thoracic esophagus. 2.  Endotracheal tube in appropriate position. 3.  Stable cardiomegaly and mild interstitial edema. 4.  No significant change in bilateral pleural effusions and left retrocardiac atelectasis.   Original Report Authenticated By: Earle Gell, M.D.    Dg Chest Port 1 View  05/20/2012  *RADIOLOGY REPORT*  Clinical Data: Endotracheal tube placement  PORTABLE CHEST - 1 VIEW  Comparison: 05/18/2012  Findings:  Endotracheal tube and right internal jugular central venous catheter are stable.  NG tube placed.  The tip is in the distal esophagus.  Patchy bilateral airspace opacities have improved.  No pneumothorax.  Stable vascular congestion.  IMPRESSION: NG tube placed.  Tip is in the distal esophagus.  Improved bilateral airspace opacities.   Original Report Authenticated By: Marybelle Killings, M.D.    Dg Chest Port 1 View  05/18/2012  *RADIOLOGY REPORT*  Clinical Data:  Intubation, arrest.  PORTABLE CHEST - 1 VIEW  Comparison: 02/21/2012  Findings: Endotracheal tube is 4-5 cm above the carina.  Right central line tip in the SVC.  No pneumothorax.  Diffuse bilateral airspace disease with cardiomegaly.  No effusions.  No acute bony abnormality.  IMPRESSION:  Support devices in expected position as above. No pneumothorax.  Diffuse bilateral airspace disease.   Original Report Authenticated By: Rolm Baptise, M.D.    Dg Abd Portable 1v  05/20/2012  *RADIOLOGY REPORT*  Clinical Data: Vomiting  PORTABLE ABDOMEN - 1 VIEW  Comparison: 03/13/2012  Findings: The tip of the NG tube is at the gastroesophageal junction.  Mild gastric distention with gas.  No disproportionate dilatation of small bowel.  Limited study by technique.  No obvious free intraperitoneal gas.  IMPRESSION: The NG tube tip at the gastroesophageal junction.  No evidence of small bowel obstruction.   Original Report Authenticated By: Marybelle Killings, M.D.     Current Medications:  . amiodarone  400 mg Per NG tube BID  . antiseptic oral rinse  15 mL Mouth Rinse BID  . carvedilol  12.5 mg Oral BID WC  . insulin aspart  0-9 Units Subcutaneous TID WC  . insulin glargine  10 Units Subcutaneous Q24H  . pantoprazole (PROTONIX) IV  40 mg Intravenous Q24H  . potassium chloride  40 mEq Oral Once  . THROMBI-PAD  1 each Topical Once  . warfarin  2 mg Oral ONCE-1800  . Warfarin - Pharmacist Dosing Inpatient   Does not apply q1800    ASSESSMENT AND PLAN:  1. VF  arrest  2. Prolonged QT, Tikosyn d/c'd  3. PAF  4. LV dysfunction  5. CAD  6. Valvular heart disease s/p bioprosthetic AVR and MV repair 2008  7. Anemia  8. Superficial venous thrombosis, LUE    Neurological status improved. LUE duplex confirms DVT of the left cephalic and basilic veins. INR therapeutic; continue heparin today and DC in AM; Elevate LUE. Continue to monitor. Continue coreg 12.5 BID. Add cozaar 25 mg po daily; add lasix 40 mg po daily. Continue amio for intermittent a-fib/VT (change to 400 mg po daily). ICD prior to DC. Mobilize. Needs significant PT; transfer to telemetry in AM if stable.   [name] 8:05 AM

## 2012-05-29 ENCOUNTER — Inpatient Hospital Stay (HOSPITAL_COMMUNITY): Payer: Medicare Other

## 2012-05-29 LAB — CBC
HCT: 25.6 % — ABNORMAL LOW (ref 36.0–46.0)
MCHC: 30.1 g/dL (ref 30.0–36.0)
Platelets: 485 10*3/uL — ABNORMAL HIGH (ref 150–400)
RDW: 15.6 % — ABNORMAL HIGH (ref 11.5–15.5)
WBC: 13.9 10*3/uL — ABNORMAL HIGH (ref 4.0–10.5)

## 2012-05-29 LAB — BASIC METABOLIC PANEL
Chloride: 103 mEq/L (ref 96–112)
GFR calc Af Amer: 68 mL/min — ABNORMAL LOW (ref >90)
GFR calc non Af Amer: 59 mL/min — ABNORMAL LOW (ref >90)
Potassium: 3.8 mEq/L (ref 3.5–5.1)
Sodium: 147 mEq/L — ABNORMAL HIGH (ref 135–145)

## 2012-05-29 LAB — PROTIME-INR
INR: 2.44 — ABNORMAL HIGH (ref 0.00–1.49)
Prothrombin Time: 25.4 seconds — ABNORMAL HIGH (ref 11.6–15.2)

## 2012-05-29 LAB — GLUCOSE, CAPILLARY
Glucose-Capillary: 264 mg/dL — ABNORMAL HIGH (ref 70–99)
Glucose-Capillary: 161 mg/dL — ABNORMAL HIGH (ref 70–99)

## 2012-05-29 LAB — MAGNESIUM: Magnesium: 1.8 mg/dL (ref 1.5–2.5)

## 2012-05-29 LAB — HEPARIN LEVEL (UNFRACTIONATED): Heparin Unfractionated: 0.42 IU/mL (ref 0.30–0.70)

## 2012-05-29 MED ORDER — FUROSEMIDE 40 MG PO TABS
40.0000 mg | ORAL_TABLET | Freq: Two times a day (BID) | ORAL | Status: DC
  Administered 2012-05-29 (×2): 40 mg via ORAL
  Filled 2012-05-29 (×5): qty 1

## 2012-05-29 MED ORDER — ENSURE PUDDING PO PUDG
1.0000 | Freq: Two times a day (BID) | ORAL | Status: DC
  Administered 2012-05-29 – 2012-06-07 (×9): 1 via ORAL

## 2012-05-29 MED ORDER — WARFARIN SODIUM 3 MG PO TABS
3.0000 mg | ORAL_TABLET | Freq: Once | ORAL | Status: AC
  Administered 2012-05-29: 3 mg via ORAL
  Filled 2012-05-29 (×2): qty 1

## 2012-05-29 MED ORDER — WARFARIN SODIUM 2 MG PO TABS
2.0000 mg | ORAL_TABLET | Freq: Once | ORAL | Status: DC
  Filled 2012-05-29: qty 1

## 2012-05-29 MED ORDER — FUROSEMIDE 10 MG/ML IJ SOLN
40.0000 mg | Freq: Once | INTRAMUSCULAR | Status: AC
  Administered 2012-05-29: 40 mg via INTRAVENOUS
  Filled 2012-05-29: qty 4

## 2012-05-29 MED ORDER — ENSURE COMPLETE PO LIQD
237.0000 mL | Freq: Two times a day (BID) | ORAL | Status: DC
  Administered 2012-05-29 – 2012-06-09 (×13): 237 mL via ORAL

## 2012-05-29 MED ORDER — POTASSIUM CHLORIDE CRYS ER 10 MEQ PO TBCR
20.0000 meq | EXTENDED_RELEASE_TABLET | Freq: Two times a day (BID) | ORAL | Status: DC
  Administered 2012-05-29 – 2012-05-31 (×6): 20 meq via ORAL
  Filled 2012-05-29 (×2): qty 2
  Filled 2012-05-29: qty 1
  Filled 2012-05-29 (×2): qty 2
  Filled 2012-05-29: qty 1
  Filled 2012-05-29: qty 2
  Filled 2012-05-29: qty 1
  Filled 2012-05-29: qty 2
  Filled 2012-05-29 (×2): qty 1

## 2012-05-29 NOTE — Progress Notes (Signed)
Met with patient at bedside to talk about possible CIR admission. Explained that we await her ICD placement before we can consider inpatient rehab. CIR admission will be pending bed availability at that time. Spoke with pt's RNCM and reported above. For questions call [phone].

## 2012-05-29 NOTE — Progress Notes (Signed)
NUTRITION FOLLOW UP/consult   Intervention:   1. Ensure Complete po BID, each supplement provides 350 kcal and 13 grams of protein.  2. Ensure Pudding po BID, each supplement provides 170 kcal and 4 grams of protein.   3. Magic cup BID with meals, each supplement provides 290 kcal and 9 grams of protein.    Nutrition Dx:   Inadequate oral intake now related to poor appetite as evidenced by limited intake.   Goal:   Meet >/=90% estimated nutrition needs. Unmet   Monitor:   PO intake, weight trends, labs, I/O's  Assessment:   Pt diet was advanced to D2, TF has been d/d'd. Per pt and husband pt was not eating well PTA. Since recent surgeries, pt has had decreased appetite and mostly takes Ensure. Only eats about 1 bite of foods on the tray. 2 ensure pudding yesterday and one ensure shake.  Agreeable to continues Ensure shakes and pudding, willing to try Magic Cup.   If pt continues with poor intake and is unable to meet nutrition needs/is losing weight, may benefit from alternate means of nutrition. At this time, weight loss has not been significant.    Height: Ht Readings from Last 1 Encounters:  05/18/12 5\' 6"  (1.676 m)    Weight Status:   Wt Readings from Last 1 Encounters:  05/29/12 167 lb 15.9 oz (76.2 kg)   Wt Readings from Last 10 Encounters:  05/29/12 167 lb 15.9 oz (76.2 kg)  04/21/12 165 lb 8 oz (75.07 kg)  04/05/12 166 lb (75.297 kg)  04/03/12 165 lb (74.844 kg)  04/03/12 165 lb (74.844 kg)  03/31/12 168 lb 12.8 oz (76.567 kg)  03/21/12 172 lb (78.019 kg)  03/21/12 172 lb (78.019 kg)  03/17/12 172 lb 12.8 oz (78.382 kg)  03/14/12 173 lb (78.472 kg)     Re-estimated needs:  Kcal: 1650-1850 Protein: 75-90 gm  Fluid: 1.7-1.8   Skin: intact   Diet Order: Dysphagia 2   Intake/Output Summary (Last 24 hours) at 05/29/12 1043 Last data filed at 05/29/12 0800  Gross per 24 hour  Intake 634.17 ml  Output      0 ml  Net 634.17 ml  +4 L this admission    Last BM: none documented this admission   Labs:   Recent Labs Lab 05/24/12 0500 05/25/12 0359 05/26/12 0500 05/27/12 0436 05/28/12 0435 05/29/12 0431  NA 142 145 148* 149* 146* 147*  K 3.8 4.2 4.5 3.6 3.8 3.8  CL 105 105 106 105 102 103  CO2 28 31 35* 34* 36* 37*  BUN 35* 38* 50* 49* 43* 35*  CREATININE 1.07 1.07 1.05 1.07 1.06 0.95  CALCIUM 8.4 8.7 8.8 8.8 9.0 8.9  MG 1.7 1.9 2.2  --   --  1.8  PHOS 3.9 4.4 4.9*  --   --   --   GLUCOSE 173* 182* 125* 85 129* 123*    CBG (last 3)   Recent Labs  05/28/12 1620 05/28/12 2150 05/29/12 0802  GLUCAP 189* 179* 121*    Scheduled Meds: . amiodarone  400 mg Per NG tube Daily  . antiseptic oral rinse  15 mL Mouth Rinse BID  . carvedilol  12.5 mg Oral BID WC  . furosemide  40 mg Oral BID  . insulin aspart  0-9 Units Subcutaneous TID WC  . insulin glargine  10 Units Subcutaneous Q24H  . losartan  25 mg Oral Daily  . pantoprazole  40 mg Oral Daily  .  potassium chloride  20 mEq Oral BID  . THROMBI-PAD  1 each Topical Once  . Warfarin - Pharmacist Dosing Inpatient   Does not apply q1800    Continuous Infusions: . sodium chloride 10 mL/hr at 05/28/12 [address], LDN Pager [phone] After Hours pager [phone]

## 2012-05-29 NOTE — Progress Notes (Signed)
Patient Name: [name] Date of Encounter: 05/29/2012     Principal Problem:   Sudden cardiac arrest Active Problems:   Atrial fibrillation   Mitral valve disease   Type II or unspecified type diabetes mellitus without mention of complication, uncontrolled   GERD (gastroesophageal reflux disease)   Ischemic cardiomyopathy   Valvular heart disease-bicuspid aortic valve s/p Mechanical replacement//MV repair   Ventricular fibrillation   Acute respiratory failure   Warfarin-induced coagulopathy   Acute encephalopathy   Metabolic acidosis   Sustained ventricular fibrillation    SUBJECTIVE:  Ms. Palleschi is a 72 year old woman with h/o severe AS, mitral regurgitation, aneurysm of the ascending thoracic aorta (status post aortic valve replacement, mitral valve repair, resection and grafting of the ascending thoracic aortic aneurysm on Jul 07, 2006), CAD s/p CABG, AF s/p recent Tikosyn initiation and LV dysfunction who presented yesterday after out-of-hospital VF arrest. K 4.4. Mg 2.7. Ms. Deener is intubated, sedated and on cooling per protocol. Her history is obtained from the chart. According to her H&P, her husband drove her to [facility] ED and there she required prolonged resuscitation, with multiple drugs and shocks, but pulses returned in about 45 minutes. She was transferred to [facility] ED where she was intubated and sedated. Her initial QT was difficult to assess however, with cooling, she did have marked QT prolongation with recurrent VT/VF.  Alert; answers questions; Mild dyspnea; no chest pain  OBJECTIVE  Filed Vitals:   05/28/12 2100 05/29/12 0000 05/29/12 0400 05/29/12 0500  BP:  104/37 125/45   Pulse: 53 56 75   Temp: 97 F (36.1 C) 98.6 F (37 C) 98.4 F (36.9 C) 98.5 F (36.9 C)  TempSrc: Oral Axillary Axillary Axillary  Resp: 21 17 24    Height:      Weight:   167 lb 15.9 oz (76.2 kg)   SpO2: 100% 97% 96%     Intake/Output Summary (Last 24 hours) at 05/29/12 0743 Last  data filed at 05/29/12 0600  Gross per 24 hour  Intake 611.17 ml  Output      0 ml  Net 611.17 ml   Weight change: -3 lb 1.4 oz (-1.4 kg)  PHYSICAL EXAM  Head: Normocephalic, atraumatic Neck: Supple Lungs: Mild crackles bases Heart: Tachycardic, II/VI systolic murmur at RUSB Abdomen: Not distended, non-tender Extremities: No edema Neuro: Moves all ext; answers questions appropriately  LABS:  Recent Labs     05/28/12  0435  05/29/12  0431  WBC  12.0*  13.9*  HGB  7.9*  7.7*  HCT  26.1*  25.6*  MCV  84.7  86.5  PLT  524*  485*   Recent Labs Lab 05/24/12 0500  05/27/12 0436 05/28/12 0435 05/29/12 0431  NA 142  < > 149* 146* 147*  K 3.8  < > 3.6 3.8 3.8  CL 105  < > 105 102 103  CO2 28  < > 34* 36* 37*  BUN 35*  < > 49* 43* 35*  CREATININE 1.07  < > 1.07 1.06 0.95  CALCIUM 8.4  < > 8.8 9.0 8.9  PROT 6.0  --   --   --   --   BILITOT 0.4  --   --   --   --   ALKPHOS 113  --   --   --   --   ALT 209*  --   --   --   --   AST 110*  --   --   --   --  GLUCOSE 173*  < > 85 129* 123*  < > = values in this interval not displayed.    Radiology/Studies:  Ct Head Wo Contrast  05/18/2012  *RADIOLOGY REPORT*  Clinical Data: Unresponsive.  Prolonged resuscitation.  Evaluate for hemorrhage.  CT HEAD WITHOUT CONTRAST  Technique:  Contiguous axial images were obtained from the base of the skull through the vertex without contrast.  Comparison: MR brain 06/24/2006 and CT head 06/24/2006.  Findings: No evidence of acute infarct, acute hemorrhage, mass lesion, mass effect or hydrocephalus.  Atrophy.  There is mild periventricular low attenuation.  Tiny remote left basal ganglia lacunar infarct.  Scattered mucosal disease in the paranasal sinuses.  IMPRESSION:  1.  No acute intracranial abnormality. 2.  Atrophy and mild chronic microvascular white matter ischemic changes.   Original Report Authenticated By: Lorin Picket, M.D.    Dg Chest Port 1 View  05/22/2012  *RADIOLOGY REPORT*   Clinical Data: Endotracheal tube position.  PORTABLE CHEST - 1 VIEW  Comparison: 05/21/2012  Findings: Endotracheal tube in place with tip about 5.2 cm above the carina.  Enteric tube has been removed.  Right central venous catheter is unchanged in position.  Cardiac enlargement with normal pulmonary vascularity.  No focal consolidation or airspace disease in the lungs.  No pneumothorax.  No blunting of costophrenic angles.  Stable appearance of postoperative changes in the mediastinum.  IMPRESSION: Endotracheal tube and right central venous catheters are unchanged in position.  Enteric tube was removed.  Cardiac enlargement.  No developing infiltrates.   Original Report Authenticated By: Lucienne Capers, M.D.    Dg Chest Port 1 View  05/21/2012  *RADIOLOGY REPORT*  Clinical Data: The atrial fibrillation.  Ischemic cardiomyopathy. Acute respiratory failure.  Endotracheal tube placement.  PORTABLE CHEST - 1 VIEW  Comparison: 05/20/2012  Findings: Endotracheal tube remains in appropriate position with the tip approximately 4.5 cm above the carina. The nasogastric has been pulled back with the tip now in the mid thoracic esophagus. Right jugular center venous catheter remains in appropriate position.  Cardiomegaly is stable.  Mild perihilar interstitial edema pattern shows no significant change.  A small bilateral pleural effusions are again noted with left retrocardiac atelectasis.  These findings show no significant change.  IMPRESSION:  1. Nasogastric tube been pulled back with the tip now in the mid thoracic esophagus. 2.  Endotracheal tube in appropriate position. 3.  Stable cardiomegaly and mild interstitial edema. 4.  No significant change in bilateral pleural effusions and left retrocardiac atelectasis.   Original Report Authenticated By: Earle Gell, M.D.    Dg Chest Port 1 View  05/20/2012  *RADIOLOGY REPORT*  Clinical Data: Endotracheal tube placement  PORTABLE CHEST - 1 VIEW  Comparison: 05/18/2012   Findings: Endotracheal tube and right internal jugular central venous catheter are stable.  NG tube placed.  The tip is in the distal esophagus.  Patchy bilateral airspace opacities have improved.  No pneumothorax.  Stable vascular congestion.  IMPRESSION: NG tube placed.  Tip is in the distal esophagus.  Improved bilateral airspace opacities.   Original Report Authenticated By: Marybelle Killings, M.D.    Dg Chest Port 1 View  05/18/2012  *RADIOLOGY REPORT*  Clinical Data:  Intubation, arrest.  PORTABLE CHEST - 1 VIEW  Comparison: 02/21/2012  Findings: Endotracheal tube is 4-5 cm above the carina.  Right central line tip in the SVC.  No pneumothorax.  Diffuse bilateral airspace disease with cardiomegaly.  No effusions.  No acute bony abnormality.  IMPRESSION:  Support devices in expected position as above. No pneumothorax.  Diffuse bilateral airspace disease.   Original Report Authenticated By: Rolm Baptise, M.D.    Dg Abd Portable 1v  05/20/2012  *RADIOLOGY REPORT*  Clinical Data: Vomiting  PORTABLE ABDOMEN - 1 VIEW  Comparison: 03/13/2012  Findings: The tip of the NG tube is at the gastroesophageal junction.  Mild gastric distention with gas.  No disproportionate dilatation of small bowel.  Limited study by technique.  No obvious free intraperitoneal gas.  IMPRESSION: The NG tube tip at the gastroesophageal junction.  No evidence of small bowel obstruction.   Original Report Authenticated By: Marybelle Killings, M.D.     Current Medications:  . amiodarone  400 mg Per NG tube Daily  . antiseptic oral rinse  15 mL Mouth Rinse BID  . carvedilol  12.5 mg Oral BID WC  . furosemide  40 mg Oral Daily  . insulin aspart  0-9 Units Subcutaneous TID WC  . insulin glargine  10 Units Subcutaneous Q24H  . losartan  25 mg Oral Daily  . pantoprazole  40 mg Oral Daily  . THROMBI-PAD  1 each Topical Once  . Warfarin - Pharmacist Dosing Inpatient   Does not apply q1800    ASSESSMENT AND PLAN:  1. VF arrest  2. Prolonged  QT, Tikosyn d/c'd  3. PAF  4. LV dysfunction  5. CAD  6. Valvular heart disease s/p bioprosthetic AVR and MV repair 2008  7. Anemia  8. Superficial venous thrombosis, LUE    Neurological status stable. LUE duplex confirms DVT of the left cephalic and basilic veins. INR therapeutic; DC heparin; Elevate LUE. Continue to monitor. Continue coreg 12.5 BID and cozaar 25 mg po daily; change lasix to 40 mg po BID. Continue amio for intermittent a-fib/VT (change to 400 mg po daily). ICD prior to DC. Mobilize. Needs significant PT; transfer to telemetry; patient must remain on monitor at all times until ICD placed.   [name] 7:43 AM

## 2012-05-29 NOTE — Progress Notes (Signed)
Physical Therapy Treatment Patient Details Name: [name] MRN: [id number] DOB: 07/14/1940 Today's Date: 05/29/2012 Time: [id number] PT Time Calculation (min): 17 min  PT Assessment / Plan / Recommendation Comments on Treatment Session  pt rpesents with Cardiac Arrest, intubated 3/13-3/18 and with L UE DVT.  pt still with cognitive deficits affecting safety and mobility.  2 person A needed for safety with mobility.      Follow Up Recommendations  CIR     Does the patient have the potential to tolerate intense rehabilitation     Barriers to Discharge        Equipment Recommendations  Rolling walker with 5" wheels    Recommendations for Other Services OT consult  Frequency Min 3X/week   Plan Discharge plan remains appropriate;Frequency remains appropriate    Precautions / Restrictions Precautions Precautions: Fall Restrictions Weight Bearing Restrictions: No   Pertinent Vitals/Pain Denies pain.      Mobility  Bed Mobility Bed Mobility: Not assessed Transfers Transfers: Sit to Stand;Stand to Sit Sit to Stand: 3: Mod assist;With upper extremity assist;From chair/3-in-1;With armrests Stand to Sit: 3: Mod assist;With upper extremity assist;To chair/3-in-1;With armrests Details for Transfer Assistance: Increased time needed to process and repeated cues for technique and safety.   Ambulation/Gait Ambulation/Gait Assistance: 1: +2 Total assist Ambulation/Gait: Patient Percentage: 60% Ambulation Distance (Feet): 80 Feet Assistive device: 2 person hand held assist Ambulation/Gait Assistance Details: Cues for upright posture, attending to task.   Gait Pattern: Step-through pattern;Trunk flexed;Shuffle Stairs: No Wheelchair Mobility Wheelchair Mobility: No    Exercises     PT Diagnosis:    PT Problem List:   PT Treatment Interventions:     PT Goals Acute Rehab PT Goals Time For Goal Achievement: 06/07/12 Potential to Achieve Goals: Good PT Goal: Sit to Stand -  Progress: Progressing toward goal PT Goal: Stand to Sit - Progress: Progressing toward goal PT Goal: Stand - Progress: Progressing toward goal PT Goal: Ambulate - Progress: Progressing toward goal  Visit Information  Last PT Received On: 05/29/12 Assistance Needed: +2    Subjective Data  Subjective: Good Luck.     Cognition  Cognition Overall Cognitive Status: Impaired Area of Impairment: Following commands;Awareness of deficits;Awareness of errors;Problem solving;Attention Arousal/Alertness: Awake/alert Orientation Level: Appears intact for tasks assessed Behavior During Session: [facility] for tasks performed Current Attention Level: Focused Following Commands: Follows one step commands with increased time Awareness of Errors: Assistance required to identify errors made;Assistance required to correct errors made Awareness of Errors - Other Comments: Require tactile/verbal cues to identify sitting balance deficits. Awareness of Deficits: Cues to identify LUE proprioceptive deficit.  Problem Solving: Increased time and step by step cueing for transfers.      Balance  Balance Balance Assessed: Yes Static Standing Balance Static Standing - Balance Support: Bilateral upper extremity supported Static Standing - Level of Assistance: 3: Mod assist  End of Session PT - End of Session Equipment Utilized During Treatment: Gait belt;Oxygen Activity Tolerance: Patient tolerated treatment well Patient left: in chair;with call bell/phone within reach Nurse Communication: Mobility status   GP     Catarina Hartshorn, [place] 05/29/2012, 10:08 AM

## 2012-05-29 NOTE — Progress Notes (Addendum)
ANTICOAGULATION CONSULT NOTE - Follow Up Consult  Pharmacy Consult for coumadin Indication: afib and AVR + MV repair and RUE DVT  No Known Allergies  Patient Measurements: Height: 5\' 6"  (167.6 cm) Weight: 167 lb 15.9 oz (76.2 kg) IBW/kg (Calculated) : 59.3 Heparin Dosing Weight: 77 kg  Vital Signs: Temp: 98.9 F (37.2 C) (03/24 0800) Temp src: Oral (03/24 0800) BP: 134/51 mmHg (03/24 0800) Pulse Rate: 78 (03/24 0800)  Labs:  Recent Labs  05/27/12 0436  05/28/12 0435 05/28/12 1457 05/29/12 0431  HGB 7.2*  --  7.9*  --  7.7*  HCT 23.4*  --  26.1*  --  25.6*  PLT 414*  --  524*  --  485*  LABPROT 20.5*  --  25.6*  --  25.4*  INR 1.83*  --  2.47*  --  2.44*  HEPARINUNFRC 0.76*  < > 0.25* 0.31 0.42  CREATININE 1.07  --  1.06  --  0.95  < > = values in this interval not displayed.  Estimated Creatinine Clearance: 56.7 ml/min (by C-G formula based on Cr of 0.95).  Assessment: 72 yo female with afib and AVR + MV repair and RUE DVT is currently on therapeutic heparin with therapeutic INR x2.  Heparin level was 0.4 and INR 2.4.  Goal of Therapy:  Heparin level 0.3-0.7 units/ml Monitor platelets by anticoagulation protocol: Yes INR goal 2.5-3.5   Plan:  1) d/c heparin 2) Warfarin 3mg  today 3) Daily INR for now  Erin Hearing PharmD., BCPS Clinical Pharmacist Pager [phone] 05/29/2012 11:57 AM

## 2012-05-29 NOTE — Progress Notes (Signed)
Patient with shortness of breath this afternoon. Respirations 20, labored, using accessory muscles, oxygen saturations 85% 2 l/m [place].  Oxygen increased to 4 liters with saturations up to 94%.  Nicole Kindred PA paged and notified.  Orders received.  Lasix 40 mg IV ordered, BP 96/59.  OK to give lasix per Nicole Kindred PA.  Unable to do strict i/o due to patient being incontinent.  Will continue to monitor.  Sanda Linger

## 2012-05-30 DIAGNOSIS — I5023 Acute on chronic systolic (congestive) heart failure: Secondary | ICD-10-CM

## 2012-05-30 LAB — GLUCOSE, CAPILLARY
Glucose-Capillary: 120 mg/dL — ABNORMAL HIGH (ref 70–99)
Glucose-Capillary: 209 mg/dL — ABNORMAL HIGH (ref 70–99)

## 2012-05-30 LAB — BASIC METABOLIC PANEL
BUN: 31 mg/dL — ABNORMAL HIGH (ref 6–23)
CO2: 39 mEq/L — ABNORMAL HIGH (ref 19–32)
Chloride: 105 mEq/L (ref 96–112)
GFR calc Af Amer: 60 mL/min — ABNORMAL LOW (ref >90)
Potassium: 4.6 mEq/L (ref 3.5–5.1)

## 2012-05-30 MED ORDER — WARFARIN SODIUM 4 MG PO TABS
4.0000 mg | ORAL_TABLET | Freq: Once | ORAL | Status: AC
  Administered 2012-05-30: 4 mg via ORAL
  Filled 2012-05-30: qty 1

## 2012-05-30 MED ORDER — FREE WATER
200.0000 mL | Freq: Three times a day (TID) | Status: DC
  Administered 2012-05-30 – 2012-06-02 (×9): 200 mL via ORAL

## 2012-05-30 MED ORDER — CARVEDILOL 6.25 MG PO TABS
6.2500 mg | ORAL_TABLET | Freq: Two times a day (BID) | ORAL | Status: DC
  Administered 2012-05-30 – 2012-06-09 (×21): 6.25 mg via ORAL
  Filled 2012-05-30 (×23): qty 1

## 2012-05-30 MED ORDER — AMIODARONE HCL 200 MG PO TABS
400.0000 mg | ORAL_TABLET | Freq: Every day | ORAL | Status: DC
Start: 2012-05-30 — End: 2012-06-02
  Administered 2012-05-30 – 2012-06-01 (×3): 400 mg via ORAL
  Filled 2012-05-30 (×4): qty 2

## 2012-05-30 MED ORDER — FUROSEMIDE 10 MG/ML IJ SOLN
80.0000 mg | Freq: Two times a day (BID) | INTRAMUSCULAR | Status: DC
  Administered 2012-05-30 – 2012-05-31 (×4): 80 mg via INTRAVENOUS
  Filled 2012-05-30 (×7): qty 8

## 2012-05-30 NOTE — Progress Notes (Addendum)
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 K 4.4. Mg 2.7. [name] is intubated, sedated and on cooling per protocol. Her history is obtained from the chart. According to her H&P, her husband drove her to [facility] ED and there she required prolonged resuscitation, with multiple drugs and shocks, but pulses returned in about 45 minutes. She was transferred to [facility] ED where she was intubated and sedated. Her initial QT was difficult to assess however, with cooling, she did have marked QT prolongation with recurrent VT/VF.  Alert; answers questions; Mild dyspnea; no chest pain  OBJECTIVE  Filed Vitals:   05/29/12 1740 05/29/12 1747 05/29/12 1940 05/29/12 2100  BP:   96/59 96/59  Pulse:    54  Temp:    97.6 F (36.4 C)  TempSrc:      Resp: 20 20  16   Height:      Weight:      SpO2: 85% 94%  100%    Intake/Output Summary (Last 24 hours) at 05/30/12 0612 Last data filed at 05/29/12 1300  Gross per 24 hour  Intake    143 ml  Output      0 ml  Net    143 ml    Weight change:   PHYSICAL EXAM  Head: Normocephalic, atraumatic Neck: Supple Lungs: Crackles, diminised BS bases Heart: RRR II/VI systolic murmur at RUSB Abdomen: Not distended, non-tender Extremities: trace edema Neuro: Moves all ext; answers questions appropriately  LABS:  Recent Labs     05/28/12  0435  05/29/12  0431  WBC  12.0*  13.9*  HGB  7.9*  7.7*  HCT  26.1*  25.6*  MCV  84.7  86.5  PLT  524*  485*   Recent Labs Lab 05/24/12 0500  05/27/12 0436 05/28/12 0435 05/29/12 0431  NA 142  < > 149* 146* 147*  K 3.8  < > 3.6 3.8 3.8  CL 105  < > 105 102 103  CO2 28  < > 34* 36* 37*  BUN 35*  < > 49* 43* 35*  CREATININE 1.07  < > 1.07 1.06 0.95  CALCIUM 8.4  < > 8.8 9.0 8.9  PROT 6.0  --   --   --   --   BILITOT 0.4  --   --   --   --   ALKPHOS 113  --   --   --   --   ALT 209*  --   --   --   --   AST 110*  --   --   --   --   GLUCOSE  173*  < > 85 129* 123*  < > = values in this interval not displayed.    Radiology/Studies:  Ct Head Wo Contrast  05/18/2012  *RADIOLOGY REPORT*  Clinical Data: Unresponsive.  Prolonged resuscitation.  Evaluate for hemorrhage.  CT HEAD WITHOUT CONTRAST  Technique:  Contiguous axial images were obtained from the base of the skull through the vertex without contrast.  Comparison: MR brain 06/24/2006 and CT head 06/24/2006.  Findings: No evidence of acute infarct, acute hemorrhage, mass lesion, mass effect or hydrocephalus.  Atrophy.  There is mild periventricular low attenuation.  Tiny remote left basal ganglia lacunar infarct.  Scattered mucosal disease in the paranasal sinuses.  IMPRESSION:  1.  No acute intracranial abnormality. 2.  Atrophy and mild chronic microvascular white matter ischemic changes.   Original Report Authenticated By: Lorin Picket, M.D.    Dg Chest Port 1 View  05/22/2012  *RADIOLOGY REPORT*  Clinical Data: Endotracheal tube position.  PORTABLE CHEST - 1 VIEW  Comparison: 05/21/2012  Findings: Endotracheal  tube in place with tip about 5.2 cm above the carina.  Enteric tube has been removed.  Right central venous catheter is unchanged in position.  Cardiac enlargement with normal pulmonary vascularity.  No focal consolidation or airspace disease in the lungs.  No pneumothorax.  No blunting of costophrenic angles.  Stable appearance of postoperative changes in the mediastinum.  IMPRESSION: Endotracheal tube and right central venous catheters are unchanged in position.  Enteric tube was removed.  Cardiac enlargement.  No developing infiltrates.   Original Report Authenticated By: Lucienne Capers, M.D.    Dg Chest Port 1 View  05/21/2012  *RADIOLOGY REPORT*  Clinical Data: The atrial fibrillation.  Ischemic cardiomyopathy. Acute respiratory failure.  Endotracheal tube placement.  PORTABLE CHEST - 1 VIEW  Comparison: 05/20/2012  Findings: Endotracheal tube remains in appropriate position with the tip approximately 4.5 cm above the carina. The nasogastric has been pulled back with the tip now in the mid thoracic esophagus. Right jugular center venous catheter remains in appropriate position.  Cardiomegaly is stable.  Mild perihilar interstitial edema pattern shows no significant change.  A small bilateral pleural effusions are again noted with left retrocardiac atelectasis.  These findings show no significant change.  IMPRESSION:  1. Nasogastric tube been pulled back with the tip now in the mid thoracic esophagus. 2.  Endotracheal tube in appropriate position. 3.  Stable cardiomegaly and mild interstitial edema. 4.  No significant change in bilateral pleural effusions and left retrocardiac atelectasis.   Original Report Authenticated By: Earle Gell, M.D.    Dg Chest Port 1 View  05/20/2012  *RADIOLOGY REPORT*  Clinical Data: Endotracheal tube placement  PORTABLE CHEST - 1 VIEW  Comparison: 05/18/2012  Findings: Endotracheal tube and right internal jugular central venous catheter are stable.  NG tube placed.  The tip  is in the distal esophagus.  Patchy bilateral airspace opacities have improved.  No pneumothorax.  Stable vascular congestion.  IMPRESSION: NG tube placed.  Tip is in the distal esophagus.  Improved bilateral airspace opacities.   Original Report Authenticated By: Marybelle Killings, M.D.    Dg Chest Port 1 View  05/18/2012  *RADIOLOGY REPORT*  Clinical Data:  Intubation, arrest.  PORTABLE CHEST - 1 VIEW  Comparison: 02/21/2012  Findings: Endotracheal tube is 4-5 cm above the carina.  Right central line tip in the SVC.  No pneumothorax.  Diffuse bilateral airspace disease with cardiomegaly.  No effusions.  No acute bony abnormality.  IMPRESSION: Support  devices in expected position as above. No pneumothorax.  Diffuse bilateral airspace disease.   Original Report Authenticated By: Rolm Baptise, M.D.    Dg Abd Portable 1v  05/20/2012  *RADIOLOGY REPORT*  Clinical Data: Vomiting  PORTABLE ABDOMEN - 1 VIEW  Comparison: 03/13/2012  Findings: The tip of the NG tube is at the gastroesophageal junction.  Mild gastric distention with gas.  No disproportionate dilatation of small bowel.  Limited study by technique.  No obvious free intraperitoneal gas.  IMPRESSION: The NG tube tip at the gastroesophageal junction.  No evidence of small bowel obstruction.   Original Report Authenticated By: Marybelle Killings, M.D.     Current Medications:  . amiodarone  400 mg Per NG tube Daily  . antiseptic oral rinse  15 mL Mouth Rinse BID  . carvedilol  12.5 mg Oral BID WC  . feeding supplement  237 mL Oral BID BM  . feeding supplement  1 Container Oral BID BM  . furosemide  40 mg Oral BID  . insulin aspart  0-9 Units Subcutaneous TID WC  . insulin glargine  10 Units Subcutaneous Q24H  . losartan  25 mg Oral Daily  . pantoprazole  40 mg Oral Daily  . potassium chloride  20 mEq Oral BID  . Warfarin - Pharmacist Dosing Inpatient   Does not apply q1800    ASSESSMENT AND PLAN:  1. VF arrest - neuro status stable; ICD when strength  improves 2. Prolonged QT, Tikosyn d/c'd  3. PAF - continue amiodarone and coumadin 4. LV dysfunction - BP borderline this AM; change coreg to 6.25 mg po BID and hold cozaar. Needs diuresis. 5. CAD  6. Valvular heart disease s/p bioprosthetic AVR and MV repair 2008  7. Anemia  8. Superficial venous thrombosis, LUE - continue coumadin 9. Acute on chronic systolic CHF - patient is volume overloaded; chest xray yesterday shows CHF; change lasix to 80 mg BID; follow renal function (BMET pending this AM).   Remains weak; encourage nutrition. Mobilize. Needs significant PT; patient must remain on monitor at all times until ICD placed.   [name] 6:12 AM  Hypernatremia worse today; increase free H2O. [name] 6:54 AM

## 2012-05-30 NOTE — Progress Notes (Signed)
ANTICOAGULATION CONSULT NOTE - Follow Up Consult  Pharmacy Consult for coumadin Indication: afib and AVR + MV repair and RUE DVT  No Known Allergies  Patient Measurements: Height: 5\' 6"  (167.6 cm) Weight: 165 lb 2 oz (74.9 kg) IBW/kg (Calculated) : 59.3 Heparin Dosing Weight: 77 kg  Vital Signs: Temp: 97.9 F (36.6 C) (03/25 0622) BP: 101/59 mmHg (03/25 0911) Pulse Rate: 79 (03/25 0911)  Labs:  Recent Labs  05/28/12 0435 05/28/12 1457 05/29/12 0431 05/30/12 0525  HGB 7.9*  --  7.7*  --   HCT 26.1*  --  25.6*  --   PLT 524*  --  485*  --   LABPROT 25.6*  --  25.4* 24.7*  INR 2.47*  --  2.44* 2.35*  HEPARINUNFRC 0.25* 0.31 0.42  --   CREATININE 1.06  --  0.95 1.06    Estimated Creatinine Clearance: 50.3 ml/min (by C-G formula based on Cr of 1.06).  Assessment: 72 yo female with afib and AVR + MV repair and RUE DVT. Patient's INR appears to be stabilizing, just below higher goal. Will slowly titrate up dosing given that high dose amiodarone could potentiate effects.    Goal of Therapy:  Monitor platelets by anticoagulation protocol: Yes INR goal 2.5-3.5   Plan:  1) Warfarin 4mg  today 2) Daily INR for now  Erin Hearing PharmD., BCPS Clinical Pharmacist Pager [phone] 05/30/2012 9:47 AM

## 2012-05-30 NOTE — Progress Notes (Signed)
Physical Therapy Treatment Patient Details Name: [name] MRN: [id number] DOB: 29-Apr-1940 Today's Date: 05/30/2012 Time: 1003-1020 PT Time Calculation (min): 17 min  PT Assessment / Plan / Recommendation Comments on Treatment Session  pt rpesents with Cardiac Arrest, intubated 3/13-3/18 and with L UE DVT.  pt still with cognitive deficits affecting safety and mobility.  2 person A needed for safety with mobility.      Follow Up Recommendations  CIR     Does the patient have the potential to tolerate intense rehabilitation     Barriers to Discharge        Equipment Recommendations  Rolling walker with 5" wheels    Recommendations for Other Services OT consult  Frequency Min 3X/week   Plan Discharge plan remains appropriate;Frequency remains appropriate    Precautions / Restrictions Precautions Precautions: Fall Precaution Comments: LUE DVT, on heparin Restrictions Weight Bearing Restrictions: No   Pertinent Vitals/Pain **SaO2 83% on RA, 94% on 4L O2; HR 75*    Mobility  Bed Mobility Bed Mobility: Supine to Sit;Sitting - Scoot to Edge of Bed Supine to Sit: 3: Mod assist;With rails;HOB elevated Sitting - Scoot to Edge of Bed: 4: Min assist Details for Bed Mobility Assistance: Manual faciliation at hips and trunk.  Significantly increased time for processing and following comands. Transfers Transfers: Sit to Stand;Stand to Sit Sit to Stand: 3: Mod assist;From bed;With upper extremity assist Stand to Sit: 3: Mod assist;To bed;To chair/3-in-1;With armrests;With upper extremity assist Details for Transfer Assistance: Increased time needed to process and repeated cues for technique and safety.  HHA of 2 for balance, VCs for flexed posture. Sit to stand x 3 for pericare.  Ambulation/Gait Ambulation/Gait Assistance: 3: Mod assist (2 for lines/leads and chair follow) Ambulation Distance (Feet): 20 Feet Assistive device: 2 person hand held assist Ambulation/Gait Assistance  Details: Distance limited 2* no O2 carrier available, pt walked in room with O2 tube extension.  Gait Pattern: Step-through pattern;Trunk flexed;Shuffle Gait velocity: decreased General Gait Details: VCs to correct flexed posture, HHA of 2 for balance    Exercises     PT Diagnosis:    PT Problem List:   PT Treatment Interventions:     PT Goals Acute Rehab PT Goals PT Goal Formulation: With patient/family Potential to Achieve Goals: Good Pt will Roll Supine to Right Side: with supervision Pt will Roll Supine to Left Side: with supervision Pt will go Supine/Side to Sit: with supervision PT Goal: Supine/Side to Sit - Progress: Progressing toward goal Pt will Sit at [facility] of Bed: with supervision PT Goal: Sit at [facility] Of Bed - Progress: Progressing toward goal Pt will go Sit to Supine/Side: with supervision Pt will go Sit to Stand: with supervision PT Goal: Sit to Stand - Progress: Progressing toward goal Pt will go Stand to Sit: with supervision PT Goal: Stand to Sit - Progress: Progressing toward goal Pt will Stand: with supervision;with bilateral upper extremity support;3 - 5 min Pt will Ambulate: 51 - 150 feet;with supervision;with least restrictive assistive device PT Goal: Ambulate - Progress: Progressing toward goal Pt will Perform Home Exercise Program: with supervision, verbal cues required/provided  Visit Information  Last PT Received On: 05/30/12 Assistance Needed: +2    Subjective Data  Subjective: They're taking the catheter out.  Patient Stated Goal: home   Cognition  Cognition Overall Cognitive Status: Impaired Area of Impairment: Following commands;Awareness of deficits;Awareness of errors;Problem solving;Attention Arousal/Alertness: Awake/alert Orientation Level: Appears intact for tasks assessed Behavior During Session: [facility] for  tasks performed Current Attention Level: Focused Following Commands: Follows one step commands with increased  time Safety/Judgement: Decreased awareness of need for assistance;Decreased safety judgement for tasks assessed Awareness of Errors: Assistance required to identify errors made;Assistance required to correct errors made Awareness of Errors - Other Comments: Require tactile/verbal cues to identify sitting balance deficits. Awareness of Deficits: Cues to identify LUE proprioceptive deficit.  Problem Solving: Increased time and step by step cueing for transfers.   Cognition - Other Comments: slow processing, slow reaction time    Balance  Balance Balance Assessed: Yes Static Sitting Balance Static Sitting - Balance Support: Feet supported;Right upper extremity supported Static Sitting - Level of Assistance: 4: Min assist Static Sitting - Comment/# of Minutes: Min A to supervision for sitting balance at EOB 2* intermittent posterior lean. Pt sat on EOB x 5 min.  Static Standing Balance Static Standing - Balance Support: Bilateral upper extremity supported Static Standing - Level of Assistance: 3: Mod assist Static Standing - Comment/# of Minutes: HHA of 2 for static/dynamic standing balance  End of Session PT - End of Session Equipment Utilized During Treatment: Gait belt;Oxygen Activity Tolerance: Patient tolerated treatment well Patient left: in chair;with call bell/phone within reach Nurse Communication: Mobility status   GP     [name] 05/30/2012, 11:00 AM [phone]

## 2012-05-31 ENCOUNTER — Inpatient Hospital Stay (HOSPITAL_COMMUNITY): Payer: Medicare Other

## 2012-05-31 ENCOUNTER — Ambulatory Visit: Payer: Medicare Other | Admitting: Internal Medicine

## 2012-05-31 DIAGNOSIS — J9811 Atelectasis: Secondary | ICD-10-CM | POA: Diagnosis not present

## 2012-05-31 DIAGNOSIS — J9819 Other pulmonary collapse: Secondary | ICD-10-CM

## 2012-05-31 LAB — URINALYSIS, ROUTINE W REFLEX MICROSCOPIC
Nitrite: NEGATIVE
Specific Gravity, Urine: 1.01 (ref 1.005–1.030)
Urobilinogen, UA: 1 mg/dL (ref 0.0–1.0)

## 2012-05-31 LAB — CBC
Hemoglobin: 7.5 g/dL — ABNORMAL LOW (ref 12.0–15.0)
MCH: 25.9 pg — ABNORMAL LOW (ref 26.0–34.0)
MCV: 91.7 fL (ref 78.0–100.0)
RBC: 2.9 MIL/uL — ABNORMAL LOW (ref 3.87–5.11)

## 2012-05-31 LAB — BASIC METABOLIC PANEL
BUN: 33 mg/dL — ABNORMAL HIGH (ref 6–23)
CO2: 45 mEq/L (ref 19–32)
Calcium: 8.9 mg/dL (ref 8.4–10.5)
Chloride: 98 mEq/L (ref 96–112)
Creatinine, Ser: 1.11 mg/dL — ABNORMAL HIGH (ref 0.50–1.10)
GFR calc Af Amer: 57 mL/min — ABNORMAL LOW (ref >90)

## 2012-05-31 LAB — GLUCOSE, CAPILLARY
Glucose-Capillary: 138 mg/dL — ABNORMAL HIGH (ref 70–99)
Glucose-Capillary: 241 mg/dL — ABNORMAL HIGH (ref 70–99)
Glucose-Capillary: 184 mg/dL — ABNORMAL HIGH (ref 70–99)

## 2012-05-31 LAB — URINE MICROSCOPIC-ADD ON

## 2012-05-31 MED ORDER — WARFARIN SODIUM 2 MG PO TABS
2.0000 mg | ORAL_TABLET | Freq: Once | ORAL | Status: AC
  Administered 2012-05-31: 2 mg via ORAL
  Filled 2012-05-31: qty 1

## 2012-05-31 MED ORDER — LOSARTAN POTASSIUM 25 MG PO TABS
25.0000 mg | ORAL_TABLET | Freq: Every day | ORAL | Status: DC
  Administered 2012-05-31 – 2012-06-09 (×10): 25 mg via ORAL
  Filled 2012-05-31 (×10): qty 1

## 2012-05-31 MED ORDER — DEXTROSE 5 % IV SOLN
1.0000 g | INTRAVENOUS | Status: DC
  Administered 2012-05-31 – 2012-06-01 (×2): 1 g via INTRAVENOUS
  Filled 2012-05-31 (×3): qty 10

## 2012-05-31 MED ORDER — DEXTROSE 5 % IV SOLN
500.0000 mg | INTRAVENOUS | Status: DC
  Filled 2012-05-31: qty 500

## 2012-05-31 NOTE — Progress Notes (Signed)
Inpatient Diabetes Program Recommendations  AACE/ADA: New Consensus Statement on Inpatient Glycemic Control (2013)  Target Ranges:  Prepandial:   less than 140 mg/dL      Peak postprandial:   less than 180 mg/dL (1-2 hours)      Critically ill patients:  140 - 180 mg/dL    Results for CORINA, NIELAND (MRN [id number]) as of 05/31/2012 12:09  Ref. Range 05/30/2012 07:30 05/30/2012 11:52 05/30/2012 16:53 05/30/2012 21:13  Glucose-Capillary Latest Range: 70-99 mg/dL 120 (H) 263 (H) 144 (H) 209 (H)   Results for ZURIEL, SENKO (MRN [id number]) as of 05/31/2012 12:09  Ref. Range 05/31/2012 07:16 05/31/2012 11:21  Glucose-Capillary Latest Range: 70-99 mg/dL 138 (H) 241 (H)    Patient having elevations in postprandial glucose levels.  PO intake poor at present.  MD- Please consider increasing Novolog correction scale (SSI) to Moderate scale tid ac + HS (currently on Sensitive scale)  Will follow. Wyn Quaker RN, MSN, CDE Diabetes Coordinator Inpatient Diabetes Program [phone]

## 2012-05-31 NOTE — Progress Notes (Signed)
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   Ventricular fibrillation   Acute respiratory failure   Warfarin-induced coagulopathy   Acute encephalopathy   Metabolic acidosis   Sustained ventricular fibrillation   Acute on chronic systolic heart failure    SUBJECTIVE:  [name] is a 72 year old woman with h/o severe AS, mitral regurgitation, aneurysm of the ascending thoracic aorta (status post aortic valve replacement, mitral valve repair, resection and grafting of the ascending thoracic aortic aneurysm on Jul 07, 2006), CAD s/p CABG, AF s/p recent Tikosyn initiation and LV dysfunction who presented yesterday after out-of-hospital VF arrest. K 4.4. Mg 2.7. [name] is intubated, sedated and on cooling per protocol. Her history is obtained from the chart. According to her H&P, her husband drove her to [facility] ED and there she required prolonged resuscitation, with multiple drugs and shocks, but pulses returned in about 45 minutes. She was transferred to [facility] ED where she was intubated and sedated. Her initial QT was difficult to assess however, with cooling, she did have marked QT prolongation with recurrent VT/VF.  Alert but somnelent; answers questions; no dyspnea; no chest pain  OBJECTIVE  Filed Vitals:   05/30/12 1045 05/30/12 1350 05/30/12 2100 05/31/12 0500  BP: 105/62 110/33 110/64 119/67  Pulse: 70 60 60 67  Temp:  98.1 F (36.7 C) 98.3 F (36.8 C) 98.3 F (36.8 C)  TempSrc:  Oral    Resp:  18 18 18   Height:      Weight:    168 lb 11.2 oz (76.522 kg)  SpO2:  100% 100% 99%    Intake/Output Summary  (Last 24 hours) at 05/31/12 [id number] Last data filed at 05/31/12 0600  Gross per 24 hour  Intake    960 ml  Output   2650 ml  Net  -1690 ml   Weight change: 3 lb 9.2 oz (1.622 kg)  PHYSICAL EXAM  Head: Normocephalic, atraumatic Neck: Supple Lungs: Crackles, diminised BS bases (L>R) Heart: RRR II/VI systolic murmur at RUSB Abdomen: Not distended, non-tender Extremities: no lower ext edema; LUE with 1-2+ edema Neuro: Moves all ext; answers questions appropriately  LABS:  Recent Labs     05/29/12  0431  05/31/12  0555  WBC  13.9*  17.8*  HGB  7.7*  7.5*  HCT  25.6*  26.6*  MCV  86.5  91.7  PLT  485*  495*    Recent Labs Lab 05/29/12 0431 05/30/12 0525 05/31/12 0555  NA 147* 152* 146*  K 3.8 4.6 4.2  CL 103 105 98  CO2 37* 39* PENDING  BUN 35* 31* 33*  CREATININE 0.95 1.06 1.11*  CALCIUM 8.9 9.0 8.9  GLUCOSE 123* 125* 150*      Radiology/Studies:  Ct Head Wo Contrast  05/18/2012  *RADIOLOGY REPORT*  Clinical Data: Unresponsive.  Prolonged resuscitation.  Evaluate for hemorrhage.  CT HEAD WITHOUT CONTRAST  Technique:  Contiguous axial images were obtained from the base of the skull through the vertex without contrast.  Comparison: MR brain 06/24/2006 and CT head 06/24/2006.  Findings: No evidence of acute infarct, acute hemorrhage, mass lesion, mass effect or  hydrocephalus.  Atrophy.  There is mild periventricular low attenuation.  Tiny remote left basal ganglia lacunar infarct.  Scattered mucosal disease in the paranasal sinuses.  IMPRESSION:  1.  No acute intracranial abnormality. 2.  Atrophy and mild chronic microvascular white matter ischemic changes.   Original Report Authenticated By: Lorin Picket, M.D.    Dg Chest Port 1 View  05/22/2012  *RADIOLOGY REPORT*  Clinical Data: Endotracheal tube position.  PORTABLE CHEST - 1 VIEW  Comparison: 05/21/2012  Findings: Endotracheal tube in place with tip about 5.2 cm above the carina.  Enteric tube has been removed.  Right  central venous catheter is unchanged in position.  Cardiac enlargement with normal pulmonary vascularity.  No focal consolidation or airspace disease in the lungs.  No pneumothorax.  No blunting of costophrenic angles.  Stable appearance of postoperative changes in the mediastinum.  IMPRESSION: Endotracheal tube and right central venous catheters are unchanged in position.  Enteric tube was removed.  Cardiac enlargement.  No developing infiltrates.   Original Report Authenticated By: Lucienne Capers, M.D.    Dg Chest Port 1 View  05/21/2012  *RADIOLOGY REPORT*  Clinical Data: The atrial fibrillation.  Ischemic cardiomyopathy. Acute respiratory failure.  Endotracheal tube placement.  PORTABLE CHEST - 1 VIEW  Comparison: 05/20/2012  Findings: Endotracheal tube remains in appropriate position with the tip approximately 4.5 cm above the carina. The nasogastric has been pulled back with the tip now in the mid thoracic esophagus. Right jugular center venous catheter remains in appropriate position.  Cardiomegaly is stable.  Mild perihilar interstitial edema pattern shows no significant change.  A small bilateral pleural effusions are again noted with left retrocardiac atelectasis.  These findings show no significant change.  IMPRESSION:  1. Nasogastric tube been pulled back with the tip now in the mid thoracic esophagus. 2.  Endotracheal tube in appropriate position. 3.  Stable cardiomegaly and mild interstitial edema. 4.  No significant change in bilateral pleural effusions and left retrocardiac atelectasis.   Original Report Authenticated By: Earle Gell, M.D.    Dg Chest Port 1 View  05/20/2012  *RADIOLOGY REPORT*  Clinical Data: Endotracheal tube placement  PORTABLE CHEST - 1 VIEW  Comparison: 05/18/2012  Findings: Endotracheal tube and right internal jugular central venous catheter are stable.  NG tube placed.  The tip is in the distal esophagus.  Patchy bilateral airspace opacities have improved.  No  pneumothorax.  Stable vascular congestion.  IMPRESSION: NG tube placed.  Tip is in the distal esophagus.  Improved bilateral airspace opacities.   Original Report Authenticated By: Marybelle Killings, M.D.    Dg Chest Port 1 View  05/18/2012  *RADIOLOGY REPORT*  Clinical Data:  Intubation, arrest.  PORTABLE CHEST - 1 VIEW  Comparison: 02/21/2012  Findings: Endotracheal tube is 4-5 cm above the carina.  Right central line tip in the SVC.  No pneumothorax.  Diffuse bilateral airspace disease with cardiomegaly.  No effusions.  No acute bony abnormality.  IMPRESSION: Support devices in expected position as above. No pneumothorax.  Diffuse bilateral airspace disease.   Original Report Authenticated By: Rolm Baptise, M.D.    Dg Abd Portable 1v  05/20/2012  *RADIOLOGY REPORT*  Clinical Data: Vomiting  PORTABLE ABDOMEN - 1 VIEW  Comparison: 03/13/2012  Findings: The tip of the NG tube is at the gastroesophageal junction.  Mild gastric distention with gas.  No disproportionate dilatation of small bowel.  Limited study by technique.  No obvious free intraperitoneal gas.  IMPRESSION: The NG tube  tip at the gastroesophageal junction.  No evidence of small bowel obstruction.   Original Report Authenticated By: Marybelle Killings, M.D.     Current Medications:  . amiodarone  400 mg Oral Daily  . antiseptic oral rinse  15 mL Mouth Rinse BID  . carvedilol  6.25 mg Oral BID WC  . feeding supplement  237 mL Oral BID BM  . feeding supplement  1 Container Oral BID BM  . free water  200 mL Oral Q8H  . furosemide  80 mg Intravenous BID  . insulin aspart  0-9 Units Subcutaneous TID WC  . insulin glargine  10 Units Subcutaneous Q24H  . pantoprazole  40 mg Oral Daily  . potassium chloride  20 mEq Oral BID  . Warfarin - Pharmacist Dosing Inpatient   Does not apply q1800    ASSESSMENT AND PLAN:  1. VF arrest - neuro status (A and O; somnelent); ICD when strength improves 2. Prolonged QT, Tikosyn d/c'd  3. PAF - continue  amiodarone and coumadin 4. LV dysfunction - Continue coreg and resume low dose cozaar. Continue present dose of lasix. 5. CAD  6. Valvular heart disease s/p bioprosthetic AVR and MV repair 2008  7. Anemia  8. Superficial venous thrombosis, LUE - continue coumadin 9. Acute on chronic systolic CHF - continue present dose of lasix and follow renal function. 10. Leukocytosis - afebrile; diminished BS LLL worse; repeat chest xray; check UA. 11. Hypernatremia - improving with free H20.   Remains weak; encourage nutrition. Mobilize. Needs significant PT; patient must remain on monitor at all times until ICD placed.   [name] 6:59 AM

## 2012-05-31 NOTE — Progress Notes (Signed)
PULMONARY  / CRITICAL CARE MEDICINE  Name: [name] MRN: [id number] DOB: August 25, 1940    ADMISSION DATE:  05/18/2012 CONSULTATION DATE:  3/13  REFERRING MD :  Maryan Rued  CHIEF COMPLAINT:  Cardiac arrest   BRIEF PATIENT DESCRIPTION:  72 year old female w/ h/o ICM (EF 35%), AF,  Recently started on Tikosyn. She was admitted 3/12 with VF arrest & prolonged qT. Team estimates 45 minutes of ACLS to ROSC.  She has a h/o severe AS, mitral regurgitation, aneurysm of the ascending thoracic aorta (status post aortic valve replacement, mitral valve repair, resection and grafting of the ascending thoracic aortic aneurysm on Jul 07, 2006), CAD s/p CABG. Her neuro status improved , course was complicated by Superficial venous thrombosis of left cephalic and basilic veins We are  reconsulted 3/26 for progressive Left lower lobe collapse/consolidation on CXR. She has improved & is able to get ob to chair     SIGNIFICANT EVENTS / STUDIES:  CT head 3/12>>>neg  Echo 3/12>>>EF 35%, diffuse hypokinesis  LINES / TUBES: OETT 3/12>>> out Right IJ CVL 3/12>>> out   CULTURES: Sputum 3/12>>>NTD 3/17 resp >> hafnia  ANTIBIOTICS: Unasyn 3/12>>>stopped 3/26 ceftx >>  SUBJECTIVE:  She has improved & is able to get ob to chair Denies cp, dyspnea Increasing  left upper extremity edema  VITAL SIGNS: Temp:  [98.1 F (36.7 C)-98.3 F (36.8 C)] 98.3 F (36.8 C) (03/26 0500) Pulse Rate:  [60-75] 71 (03/26 0837) Resp:  [18] 18 (03/26 0500) BP: (105-119)/(33-69) 110/69 mmHg (03/26 0837) SpO2:  [83 %-100 %] 99 % (03/26 0500) Weight:  [76.522 kg (168 lb 11.2 oz)] 76.522 kg (168 lb 11.2 oz) (03/26 0500) HEMODYNAMICS:   VENTILATOR SETTINGS:   INTAKE / OUTPUT: Intake/Output     03/25 0701 - 03/26 0700 03/26 0701 - 03/27 0700   P.O. 960    Total Intake(mL/kg) 960 (12.5)    Urine (mL/kg/hr) 2650 (1.4)    Total Output 2650     Net -1690           PHYSICAL EXAMINATION: General:  Chronically   ill  appearing white female, inbed  Neuro: interactive, non focal HEENT:   No jvd Cardiovascular: RRR Lungs:  LLL rales, no rhonchi  Abdomen:  Soft, non-tender. + bowel sounds  Musculoskeletal:  Intact , LUE forearm & hand swelling Skin:  Intact other than some excoriations in the upper extremities  Dg Chest Port 1 View  05/31/2012  *RADIOLOGY REPORT*  Clinical Data: Elevated CO2.  Chest pain and CHF.  PORTABLE CHEST - 1 VIEW  Comparison: 05/29/2012.  Findings: Trachea is midline.  Heart is enlarged, stable.  Left lower lobe collapse/consolidation appears mildly progressive.  Mild diffuse bilateral air space disease.  Probable left pleural effusion.  IMPRESSION:  1.  Left lower lobe collapse/consolidation appears mildly progressive. 2.  Mild diffuse bilateral air space disease which may be due to edema. 3.  Probable small left pleural effusion.   Original Report Authenticated By: Lorin Picket, M.D.    Dg Chest Port 1v Same Day  05/29/2012  *RADIOLOGY REPORT*  Clinical Data: Shortness of breath.  Atrial fibrillation.  PORTABLE CHEST - 1 VIEW SAME DAY  Comparison: 3/22, 3/18, 3/17, 3/16, and 05/20/2012  Findings:  There is recurrence of the pulmonary edema and vascular prominence.  Small right effusion is unchanged.  Left effusion appears slightly diminished.  No other change.  IMPRESSION:  1.  Recurrent slight bilateral pulmonary edema and pulmonary vascular congestion. 2.  Persistent small effusions, left greater than right.  However, the left effusion has slightly diminished.   Original Report Authenticated By: Lorriane Shire, M.D.    ASSESSMENT / PLAN: No results found for this basename: PHART, PCO2, PCO2ART, PO2, PO2ART, HCO3, TCO2, O2SAT,  in the last 168 hours  PULMONARY A: Acute respiratory failure s/p cardiac arrest -resolved  Diffuse pulmonary infiltrates: favor edema vs ALI LLL atelectasis/ consolidation P:   - oob, mobilise -incentive spiroemtry -chest vest bid -Doubt pneumonia - WC  can rise with atlectasis too - but ok to ct ceftx - low threshold to stop once CXR/ WC improved, dc azithro - no role m ay prolong qT  CARDIOVASCULAR A:  VF arrest Afib  ICM w/ EF 35% Cardiogenic shock Severe AS and mitral valve disease.  Plan: - Amio & coumadin  RENAL  Recent Labs Lab 05/25/12 0359 05/26/12 0500 05/27/12 0436 05/28/12 0435 05/29/12 0431 05/30/12 0525 05/31/12 0555  NA 145 148* 149* 146* 147* 152* 146*  K 4.2 4.5 3.6 3.8 3.8 4.6 4.2  CL 105 106 105 102 103 105 98  CO2 31 35* 34* 36* 37* 39* 45*  GLUCOSE 182* 125* 85 129* 123* 125* 150*  BUN 38* 50* 49* 43* 35* 31* 33*  CREATININE 1.07 1.05 1.07 1.06 0.95 1.06 1.11*  CALCIUM 8.7 8.8 8.8 9.0 8.9 9.0 8.9  MG 1.9 2.2  --   --  1.8  --   --   PHOS 4.4 4.9*  --   --   --   --   --     A:  Chronic renal insufficiency  P:   - monitor lytes with lasix  GASTROINTESTINAL  Recent Labs Lab 05/27/12 0436 05/28/12 0435 05/29/12 0431 05/30/12 0525 05/31/12 0555  INR 1.83* 2.47* 2.44* 2.35* 2.84*    A:   GERD Elevated LFTs: likely shock liver, resolved Lipase elevated -resolved dysphagia  P:   - PPI - dys 2 diet  HEMATOLOGIC  Recent Labs Lab 05/28/12 0435 05/29/12 0431 05/31/12 0555  HGB 7.9* 7.7* 7.5*  HCT 26.1* 25.6* 26.6*  WBC 12.0* 13.9* 17.8*  PLT 524* 485* 495*    Recent Labs Lab 05/27/12 0436 05/28/12 0435 05/29/12 0431 05/30/12 0525 05/31/12 0555  INR 1.83* 2.47* 2.44* 2.35* 2.84*   A:   Chronic anemia Warfarin induced coagulopathy [artificial heart valve]  Left superficial thrombosis P:  - does not need long term Ac for superficial thrombus  INFECTIOUS No results found for this basename: LATICACIDVEN, PROCALCITON,  in the last 168 hours A:  Possible aspiration -treated with unasyn  P:   -short course ceftx  ENDOCRINE A:  DM P:   - SSI protocol   NEUROLOGIC A:  Acute encephalopathy - post anoxic - resolved P:  Aggressive PT  Updated husband  Kara Mead MD. FCCP. [facility] Pager [phone] If no response call [phone]

## 2012-05-31 NOTE — Progress Notes (Addendum)
CIR Admissions: Continue to follow patient's medical progress. Will not be able to consider patient for inpatient rehab until ICD placed and pt is medically stable. Met with patient and her husband to discuss possible CIR admission when pt is medically stable. Explained that CIR admission will be pending bed availability at that time. Also discussed possible option of SNF for rehabilitation if pt is not able to tolerate the the intensity of CIR.  For questions, call [phone].

## 2012-05-31 NOTE — Progress Notes (Signed)
Patient's BP 100/62.  I spoke to [employer] about this, she stated to give iv lasix now and move coreg to 8pm RN to assess BP then if less than 100 then hold, will pass to nightshift RN.

## 2012-05-31 NOTE — Progress Notes (Signed)
CSW is following this pt, writing up FL2 just in case pt needs SNF placement. Will continue to follow this pt.   Danise Edge, [place] Social Worker [phone]

## 2012-05-31 NOTE — Progress Notes (Signed)
ANTICOAGULATION CONSULT NOTE - Follow Up Consult  Pharmacy Consult for coumadin Indication: afib and AVR + MV repair and LUE DVT  No Known Allergies  Patient Measurements: Height: 5\' 6"  (167.6 cm) Weight: 168 lb 11.2 oz (76.522 kg) IBW/kg (Calculated) : 59.3 Heparin Dosing Weight: 77 kg  Vital Signs: Temp: 98.3 F (36.8 C) (03/26 0500) BP: 110/69 mmHg (03/26 0837) Pulse Rate: 71 (03/26 0837)  Labs:  Recent Labs  05/28/12 1457 05/29/12 0431 05/30/12 0525 05/31/12 0555  HGB  --  7.7*  --  7.5*  HCT  --  25.6*  --  26.6*  PLT  --  485*  --  495*  LABPROT  --  25.4* 24.7* 28.4*  INR  --  2.44* 2.35* 2.84*  HEPARINUNFRC 0.31 0.42  --   --   CREATININE  --  0.95 1.06 1.11*    Estimated Creatinine Clearance: 48.6 ml/min (by C-G formula based on Cr of 1.11).  Assessment: 72 yo female with afib and AVR + MV repair and LUE DVT. Patient's INR up to 2.8 this am currently at goal. No bleeding issues noted. Sharp increase in INR overnight, may be seeing effects of amio, will decrease dose for tonight.   Goal of Therapy:  Monitor platelets by anticoagulation protocol: Yes INR goal 2.5-3.5   Plan:  1) Warfarin 2mg  today 2) Daily INR for now  Erin Hearing PharmD., BCPS Clinical Pharmacist Pager [phone] 05/31/2012 10:40 AM

## 2012-06-01 LAB — CBC
HCT: 26.4 % — ABNORMAL LOW (ref 36.0–46.0)
MCH: 25.7 pg — ABNORMAL LOW (ref 26.0–34.0)
MCV: 89.2 fL (ref 78.0–100.0)
Platelets: 508 10*3/uL — ABNORMAL HIGH (ref 150–400)
RBC: 2.96 MIL/uL — ABNORMAL LOW (ref 3.87–5.11)

## 2012-06-01 LAB — GLUCOSE, CAPILLARY
Glucose-Capillary: 141 mg/dL — ABNORMAL HIGH (ref 70–99)
Glucose-Capillary: 137 mg/dL — ABNORMAL HIGH (ref 70–99)
Glucose-Capillary: 275 mg/dL — ABNORMAL HIGH (ref 70–99)

## 2012-06-01 LAB — BASIC METABOLIC PANEL
BUN: 37 mg/dL — ABNORMAL HIGH (ref 6–23)
Chloride: 95 mEq/L — ABNORMAL LOW (ref 96–112)
Creatinine, Ser: 1.12 mg/dL — ABNORMAL HIGH (ref 0.50–1.10)
GFR calc Af Amer: 56 mL/min — ABNORMAL LOW (ref >90)

## 2012-06-01 MED ORDER — WARFARIN SODIUM 3 MG PO TABS
3.0000 mg | ORAL_TABLET | Freq: Every day | ORAL | Status: DC
  Administered 2012-06-01 – 2012-06-02 (×2): 3 mg via ORAL
  Filled 2012-06-01 (×3): qty 1

## 2012-06-01 MED ORDER — INSULIN GLARGINE 100 UNIT/ML ~~LOC~~ SOLN
10.0000 [IU] | SUBCUTANEOUS | Status: DC
  Administered 2012-06-02 – 2012-06-09 (×8): 10 [IU] via SUBCUTANEOUS
  Filled 2012-06-01 (×12): qty 0.1

## 2012-06-01 NOTE — Progress Notes (Signed)
Clinical Social Work Department BRIEF PSYCHOSOCIAL ASSESSMENT 06/01/2012  Patient:  [name], [name]     Account Number:  [id number]     Admit date:  05/18/2012  Clinical Social Worker:  Awilda Metro  Date/Time:  06/01/2012 11:08 AM  Referred by:  RN  Date Referred:  05/31/2012 Referred for  SNF Placement   Other Referral:   CIR is looking at this pt for rehab   Interview type:  Patient Other interview type:    PSYCHOSOCIAL DATA Living Status:  HUSBAND Admitted from facility:   Level of care:   Primary support name:  Theodoro Doing Primary support relationship to patient:  SPOUSE Degree of support available:   great    CURRENT CONCERNS Current Concerns  Post-Acute Placement   Other Concerns:    SOCIAL WORK ASSESSMENT / PLAN Met with pt and her husband. Pt was quiet, but made eye contact and nodded her head appropriately to the discussion with pt's husband. CSW gave pt and her husband the list of [facility]. Pt came from living at home with her spouse. Spouse has been at pt's bedside quite consistantly.  The plan is for either CIR acceptance or SNF placement.  CSW will continue to follow the pt for social work needs.   Assessment/plan status:  Psychosocial Support/Ongoing Assessment of Needs Other assessment/ plan:   Information/referral to community resources:   CSW gave SNF list for [facility].    PATIENT'S/FAMILY'S RESPONSE TO PLAN OF CARE: Pt and family are in agreement for post-acute care for CIR or SNF at this time.  CSW will continue to follow pt's needs.   Danise Edge, [place] Social Worker [phone]

## 2012-06-01 NOTE — Progress Notes (Signed)
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 Ceesay is a 72 year old woman with h/o severe AS, mitral regurgitation, aneurysm of the ascending thoracic aorta (status post aortic valve replacement, mitral valve repair, resection and grafting of the ascending thoracic aortic aneurysm on Jul 07, 2006), CAD s/p CABG, AF s/p recent Tikosyn initiation and LV dysfunction who presented yesterday after out-of-hospital VF arrest. K 4.4. Mg 2.7. Ms. Ramp is intubated, sedated and on cooling per protocol. Her history is obtained from the chart. According to her H&P, her husband drove her to [facility] ED and there she required prolonged resuscitation, with multiple drugs and shocks, but pulses returned in about 45 minutes. She was transferred to [facility] ED where she was intubated and sedated. Her initial QT was difficult to assess however, with cooling, she did have marked QT prolongation with recurrent VT/VF.  Alert but somnelent; answers questions; no dyspnea; no chest pain  OBJECTIVE  Filed Vitals:   05/31/12 1700 05/31/12 2003 05/31/12 2226 06/01/12 0424  BP: 100/62 107/66 113/68 129/46  Pulse:  66 73 72  Temp:    98.5 F (36.9 C)  TempSrc:    Axillary  Resp:      Height:      Weight:    160 lb 12.8 oz (72.938 kg)  SpO2:    98%    Intake/Output Summary (Last 24 hours) at 06/01/12  [id number] Last data filed at 06/01/12 0600  Gross per 24 hour  Intake   1314 ml  Output   2950 ml  Net  -1636 ml   Weight change: -7 lb 14.4 oz (-3.583 kg)  PHYSICAL EXAM  Head: Normocephalic, atraumatic Neck: Supple Lungs: Crackles, diminised BS bases but improved Heart: RRR II/VI systolic murmur at RUSB Abdomen: Not distended, non-tender Extremities: no lower ext edema; LUE with 1+ edema Neuro: Moves all ext; answers questions appropriately  LABS:  Recent Labs     05/31/12  0555  06/01/12  0534  WBC  17.8*  14.0*  HGB  7.5*  7.6*  HCT  26.6*  26.4*  MCV  91.7  89.2  PLT  495*  508*    Recent Labs Lab 05/30/12 0525 05/31/12 0555 06/01/12 0534  NA 152* 146* 144  K 4.6 4.2 4.2  CL 105 98 95*  CO2 39* 45* 44*  BUN 31* 33* 37*  CREATININE 1.06 1.11* 1.12*  CALCIUM 9.0 8.9 8.9  GLUCOSE 125* 150* 141*      Radiology/Studies:  Ct Head Wo Contrast  05/18/2012  *RADIOLOGY REPORT*  Clinical Data: Unresponsive.  Prolonged resuscitation.  Evaluate for hemorrhage.  CT HEAD WITHOUT CONTRAST  Technique:  Contiguous axial images were obtained from the base of the skull through the vertex without contrast.  Comparison: MR brain 06/24/2006 and CT head 06/24/2006.  Findings: No evidence of acute infarct, acute hemorrhage, mass lesion, mass effect or hydrocephalus.  Atrophy.  There is mild periventricular low attenuation.  Tiny remote left basal ganglia lacunar infarct.  Scattered mucosal disease in the paranasal sinuses.  IMPRESSION:  1.  No acute intracranial abnormality. 2.  Atrophy and mild chronic microvascular white matter ischemic changes.   Original Report Authenticated By: Lorin Picket, M.D.    Dg Chest Port 1 View  05/22/2012  *RADIOLOGY REPORT*  Clinical Data: Endotracheal tube position.  PORTABLE CHEST - 1 VIEW  Comparison: 05/21/2012  Findings: Endotracheal tube in place with tip about 5.2 cm above the carina.  Enteric tube has been removed.  Right central venous catheter  is unchanged in position.  Cardiac enlargement with normal pulmonary vascularity.  No focal consolidation or airspace disease in the lungs.  No pneumothorax.  No blunting of costophrenic angles.  Stable appearance of postoperative changes in the mediastinum.  IMPRESSION: Endotracheal tube and right central venous catheters are unchanged in position.  Enteric tube was removed.  Cardiac enlargement.  No developing infiltrates.   Original Report Authenticated By: Lucienne Capers, M.D.    Dg Chest Port 1 View  05/21/2012  *RADIOLOGY REPORT*  Clinical Data: The atrial fibrillation.  Ischemic cardiomyopathy. Acute respiratory failure.  Endotracheal tube placement.  PORTABLE CHEST - 1 VIEW  Comparison: 05/20/2012  Findings: Endotracheal tube remains in appropriate position with the tip approximately 4.5 cm above the carina. The nasogastric has been pulled back with the tip now in the mid thoracic esophagus. Right jugular center venous catheter remains in appropriate position.  Cardiomegaly is stable.  Mild perihilar interstitial edema pattern shows no significant change.  A small bilateral pleural effusions are again noted with left retrocardiac atelectasis.  These findings show no significant change.  IMPRESSION:  1. Nasogastric tube been pulled back with the tip now in the mid thoracic esophagus. 2.  Endotracheal tube in appropriate position. 3.  Stable cardiomegaly and mild interstitial edema. 4.  No significant change in bilateral pleural effusions and left retrocardiac atelectasis.   Original Report Authenticated By: Earle Gell, M.D.    Dg Chest Port 1 View  05/20/2012  *RADIOLOGY REPORT*  Clinical Data: Endotracheal tube placement  PORTABLE CHEST - 1 VIEW  Comparison: 05/18/2012  Findings: Endotracheal tube and right internal jugular central venous catheter are stable.  NG tube placed.  The tip is in the distal esophagus.  Patchy bilateral airspace opacities have improved.  No pneumothorax.  Stable vascular  congestion.  IMPRESSION: NG tube placed.  Tip is in the distal esophagus.  Improved bilateral airspace opacities.   Original Report Authenticated By: Marybelle Killings, M.D.    Dg Chest Port 1 View  05/18/2012  *RADIOLOGY REPORT*  Clinical Data:  Intubation, arrest.  PORTABLE CHEST - 1 VIEW  Comparison: 02/21/2012  Findings: Endotracheal tube is 4-5 cm above the carina.  Right central line tip in the SVC.  No pneumothorax.  Diffuse bilateral airspace disease with cardiomegaly.  No effusions.  No acute bony abnormality.  IMPRESSION: Support devices in expected position as above. No pneumothorax.  Diffuse bilateral airspace disease.   Original Report Authenticated By: Rolm Baptise, M.D.    Dg Abd Portable 1v  05/20/2012  *RADIOLOGY REPORT*  Clinical Data: Vomiting  PORTABLE ABDOMEN - 1 VIEW  Comparison: 03/13/2012  Findings: The tip of the NG tube is at the gastroesophageal junction.  Mild gastric distention with gas.  No disproportionate dilatation of small bowel.  Limited study by technique.  No obvious free intraperitoneal gas.  IMPRESSION: The NG tube tip at the gastroesophageal  junction.  No evidence of small bowel obstruction.   Original Report Authenticated By: Marybelle Killings, M.D.     Current Medications:  . amiodarone  400 mg Oral Daily  . antiseptic oral rinse  15 mL Mouth Rinse BID  . carvedilol  6.25 mg Oral BID WC  . cefTRIAXone (ROCEPHIN)  IV  1 g Intravenous Q24H  . feeding supplement  237 mL Oral BID BM  . feeding supplement  1 Container Oral BID BM  . free water  200 mL Oral Q8H  . furosemide  80 mg Intravenous BID  . insulin aspart  0-9 Units Subcutaneous TID WC  . insulin glargine  10 Units Subcutaneous Q24H  . losartan  25 mg Oral Daily  . pantoprazole  40 mg Oral Daily  . potassium chloride  20 mEq Oral BID  . Warfarin - Pharmacist Dosing Inpatient   Does not apply q1800    ASSESSMENT AND PLAN:  1. VF arrest - neuro status (A and O; somnelent); ICD when strength improves 2.  Prolonged QT, Tikosyn d/c'd  3. PAF - continue amiodarone and coumadin 4. LV dysfunction - Continue coreg and resume low dose cozaar. Patient developing contraction alkalosis; hold lasix today and resume lower dose in AM. 5. CAD  6. Valvular heart disease s/p bioprosthetic AVR and MV repair 2008  7. Anemia  8. Superficial venous thrombosis, LUE - continue coumadin 9. Acute on chronic systolic CHF - Improving; hold lasix today as outlined above. 10. Leukocytosis - afebrile; diminished BS LLL worse; repeat chest xray; check UA. 11. Hypernatremia - improving with free H20.   Remains weak; encourage nutrition. Mobilize. Needs significant PT; patient must remain on monitor at all times until ICD placed.   [name] 7:17 AM

## 2012-06-01 NOTE — Progress Notes (Signed)
Physical Therapy Treatment Patient Details Name: [name] MRN: [id number] DOB: [birth year] Today's Date: 06/01/2012 Time: [id number] PT Time Calculation (min): 33 min  PT Assessment / Plan / Recommendation Comments on Treatment Session  pt presented with cardiac arrest with prolonged resuscitation.  He primary problem is initiation of task and distractablity within task.  Weakness and activity tolerance are also problems.  Pt would be a good rehab candidate.    Follow Up Recommendations  CIR     Does the patient have the potential to tolerate intense rehabilitation     Barriers to Discharge        Equipment Recommendations  Rolling walker with 5" wheels    Recommendations for Other Services    Frequency Min 3X/week   Plan Discharge plan remains appropriate;Frequency remains appropriate    Precautions / Restrictions Precautions Precautions: Fall Precaution Comments: LUE DVT, on heparin Restrictions Weight Bearing Restrictions: No   Pertinent Vitals/Pain     Mobility  Bed Mobility Bed Mobility: Supine to Sit;Sitting - Scoot to Edge of Bed Supine to Sit: 4: Min assist;With rails;HOB elevated Sitting - Scoot to Edge of Bed: 4: Min guard;Other (comment);With rail Details for Bed Mobility Assistance: Pt better with bed mobility today.  Pt needed max cueing to stay on task to do the movement but did not need much hands on assist at all. Transfers Transfers: Sit to Stand;Stand to Sit Sit to Stand: 4: Min assist;With upper extremity assist;From chair/3-in-1 Stand to Sit: 4: Min guard;To chair/3-in-1 Details for Transfer Assistance: increased time needed to stand.  attempted to see if pt would stand on 1...2...3... w/o physical cuing and she did not. Pt did not need physical assist to stand as much as she needed the physical cue of hands under her arms to stand. Ambulation/Gait Ambulation/Gait Assistance: 4: Min assist Ambulation Distance (Feet): 10 Feet (times 2 in the  room) Assistive device: 2 person hand held assist Ambulation/Gait Assistance Details: narrow BOS tending toward scissoring.  Generally unsteady. Gait Pattern: Step-through pattern;Trunk flexed;Shuffle;Narrow base of support Gait velocity: decreased    Exercises     PT Diagnosis:    PT Problem List:   PT Treatment Interventions:     PT Goals Acute Rehab PT Goals Time For Goal Achievement: 06/07/12 Potential to Achieve Goals: Good Pt will go Supine/Side to Sit: with supervision PT Goal: Supine/Side to Sit - Progress: Progressing toward goal Pt will Sit at Edge of Bed: with supervision PT Goal: Sit at Edge Of Bed - Progress: Progressing toward goal Pt will go Sit to Supine/Side: with supervision PT Goal: Sit to Supine/Side - Progress: Progressing toward goal Pt will go Sit to Stand: with supervision PT Goal: Sit to Stand - Progress: Progressing toward goal Pt will go Stand to Sit: with supervision PT Goal: Stand to Sit - Progress: Progressing toward goal Pt will Stand: with supervision;with bilateral upper extremity support;3 - 5 min PT Goal: Stand - Progress: Progressing toward goal Pt will Ambulate: 51 - 150 feet;with supervision;with least restrictive assistive device PT Goal: Ambulate - Progress: Progressing toward goal  Visit Information  Last PT Received On: 06/01/12 Assistance Needed: +2    Subjective Data      Cognition  Cognition Overall Cognitive Status: Impaired Area of Impairment: Attention;Memory;Awareness of errors;Awareness of deficits;Problem solving;Executive functioning Arousal/Alertness: Awake/alert Orientation Level: Disoriented to;Place;Time;Other (comment) Behavior During Session: Flat affect Current Attention Level: Focused Attention - Other Comments: Pt very distractable.  Once distracted from the task pt needed max cues  and sometimes physical cues to resume the task at hand. Memory: Decreased recall of precautions Memory Deficits: Pt unable to  recall where she lives in [place] but did recall what she had for breakfast, husbands name etc. Following Commands: Follows one step commands with increased time Safety/Judgement: Decreased awareness of need for assistance Awareness of Errors: Assistance required to identify errors made;Assistance required to correct errors made Awareness of Errors - Other Comments: Pt unaware when she is leaning over but with cues was able to correct.  Awareness of Deficits:   Problem Solving: Increased time and step by step cueing for transfers.   Executive Functioning: severely impaired. Cognition - Other Comments: slow processing, slow reaction time    Balance  Balance Balance Assessed: Yes Static Sitting Balance Static Sitting - Balance Support: Feet supported;Bilateral upper extremity supported Static Sitting - Level of Assistance: 5: Stand by assistance Static Sitting - Comment/# of Minutes: 4 Static Standing Balance Static Standing - Balance Support: Bilateral upper extremity supported Static Standing - Level of Assistance: 4: Min assist Static Standing - Comment/# of Minutes: 10 min working on upright standing tolerance, reaching, w/shifting  End of Session PT - End of Session Equipment Utilized During Treatment: Oxygen Activity Tolerance: Patient tolerated treatment well Patient left: in bed;with call bell/phone within reach Nurse Communication: Mobility status   GP     Meerab Maselli, Tessie Fass 06/01/2012, 11:33 AM 06/01/2012  Donnella Sham, PT [phone] (pager)

## 2012-06-01 NOTE — Progress Notes (Signed)
Clinical Social Work Department CLINICAL SOCIAL WORK PLACEMENT NOTE 06/01/2012  Patient:  [name], [name]  Account Number:  [id number] Admit date:  05/18/2012  Clinical Social Worker:  Awilda Metro  Date/time:  05/31/2012 10:41 AM  Clinical Social Work is seeking post-discharge placement for this patient at the following level of care:   SKILLED NURSING   (*CSW will update this form in Epic as items are completed)   05/31/2012  Patient/family provided with [place] Department of Clinical Social Work's list of facilities offering this level of care within the geographic area requested by the patient (or if unable, by the patient's family).  05/31/2012  Patient/family informed of their freedom to choose among providers that offer the needed level of care, that participate in Medicare, Medicaid or managed care program needed by the patient, have an available bed and are willing to accept the patient.  05/31/2012  Patient/family informed of [facility]' ownership interest in [facility], as well as of the fact that they are under no obligation to receive care at this facility.  PASARR submitted to EDS on 05/31/2012 PASARR number received from EDS on 05/31/2012  FL2 transmitted to all facilities in geographic area requested by pt/family on  06/01/2012 FL2 transmitted to all facilities within larger geographic area on 06/01/2012  Patient informed that his/her managed care company has contracts with or will negotiate with  certain facilities, including the following:   Pt and her husband's first choice is [facility].     Patient/family informed of bed offers received:   Patient chooses bed at  Physician recommends and patient chooses bed at    Patient to be transferred to  on   Patient to be transferred to facility by   The following physician request were entered in Epic:   Additional Comments: Danise Edge, [place] Social Worker [phone]

## 2012-06-01 NOTE — Progress Notes (Signed)
CRITICAL VALUE ALERT  Critical value received:  Co2 44  Date of notification:  06/01/12  Time of notification:  0700  Critical value read back:yes  Nurse who received alert:  Arthor Captain  MD notified (1st page):  Pulmonary/Dr Stanford Breed was on the unit and was informed  Time of first page:  0702  MD notified (2nd page):  Time of second page:  Responding MD:  Dr Stanford Breed   Time MD responded:  [phone]

## 2012-06-01 NOTE — Progress Notes (Signed)
Utilization review completed.  

## 2012-06-01 NOTE — Progress Notes (Signed)
ANTICOAGULATION CONSULT NOTE - Follow Up Consult  Pharmacy Consult for Coumadin Indication: AVR/MVR, afib, DVT  No Known Allergies  Patient Measurements: Height: 5\' 6"  (167.6 cm) Weight: 160 lb 12.8 oz (72.938 kg) IBW/kg (Calculated) : 59.3 Heparin Dosing Weight:   Vital Signs: Temp: 98.5 F (36.9 C) (03/27 0424) Temp src: Axillary (03/27 0424) BP: 108/67 mmHg (03/27 0810) Pulse Rate: 72 (03/27 0810)  Labs:  Recent Labs  05/30/12 0525 05/31/12 0555 06/01/12 0534  HGB  --  7.5* 7.6*  HCT  --  26.6* 26.4*  PLT  --  495* 508*  LABPROT 24.7* 28.4* 28.3*  INR 2.35* 2.84* 2.83*  CREATININE 1.06 1.11* 1.12*    Estimated Creatinine Clearance: 47.1 ml/min (by C-G formula based on Cr of 1.12).   Assessment: 72yo female admitted s/p VF arrest now s/p arctic sun protocol.   PMH: anemia, afib, diverticulitis, valvular heart dz, CVA, OP, DM, HTN, GERD, CHF, ICM, CAD, OA  AC: Coum PTA for mechanical AVR and MV repair 08, PAF admit INR 5.05 on 2mg  MWF, 4mg  TTSS.  INR goal 2.5-3.5 per Hochrein note 3/13. Now w/ LUE DVT (superficial venous thrombosis) of the left cephalic and basilic veins. INR 2.83 at goal. No bleeding issues noted.   Goal of Therapy:  INR 2.5-3.5 Monitor platelets by anticoagulation protocol: Yes   Plan:  Coumadin 3mg  po daily   Alford Highland, [employer] 06/01/2012,8:41 AM

## 2012-06-01 NOTE — Progress Notes (Signed)
Occupational Therapy Treatment Patient Details Name: [name] MRN: [id number] DOB: [birth year] Today's Date: 06/01/2012 Time: [id number] OT Time Calculation (min): 34 min  OT Assessment / Plan / Recommendation Comments on Treatment Session Pt with increased participation today and actually does very well with a lot of cues.  Pt able to tolerate increased activity today and moved with overall min assist to min guard at times.    Follow Up Recommendations  CIR    Barriers to Discharge       Equipment Recommendations  3 in 1 bedside comode    Recommendations for Other Services Rehab consult  Frequency Min 2X/week   Plan Discharge plan remains appropriate    Precautions / Restrictions Precautions Precautions: Fall Precaution Comments: LUE DVT, on heparin Restrictions Weight Bearing Restrictions: No   Pertinent Vitals/Pain Pt with no reports of pain.    ADL  Grooming: Performed;Wash/dry face;Teeth care;Minimal assistance;Other (comment) (max cues for initiation) Where Assessed - Grooming: Supported standing Toilet Transfer: Performed;Minimal assistance;Other (comment) (pivot to [facility].  Min cues for initiation.) Toilet Transfer Method: Stand pivot Science writer: Therapist, occupational and Hygiene: Performed;+1 Total assistance Where Assessed - Best boy and Hygiene: Standing Transfers/Ambulation Related to ADLs: Pt walked to sink with B hand held assist.  Pt actually stood with min guard but did need +2 assist to walk to sink due to swaying and decreased balance and stability.   ADL Comments: Pt stood at sink for over 10 minutes with cues to stand tall.  Pt with obvious kyphotic posture PTA.  Pt was able to brush teeth and wash hair but needed max cuing for initiation and follow thru.  Pt with very little initiation.     OT Diagnosis:    OT Problem List:   OT Treatment Interventions:     OT Goals Acute Rehab OT  Goals OT Goal Formulation: With patient/family Time For Goal Achievement: 06/08/12 Potential to Achieve Goals: Good ADL Goals Pt Will Perform Grooming: with supervision;Sitting, chair;Sitting, edge of bed;Unsupported ADL Goal: Grooming - Progress: Progressing toward goals Pt Will Perform Upper Body Bathing: with supervision;Sitting, chair;Sitting, edge of bed;Unsupported Pt Will Perform Lower Body Bathing: with min assist;Sitting, chair;Sitting, edge of bed;Unsupported Pt Will Transfer to Toilet: with supervision;Stand pivot transfer;with DME;3-in-1 ADL Goal: Toilet Transfer - Progress: Progressing toward goals Miscellaneous OT Goals Miscellaneous OT Goal #1: Pt will perform bed mobility with superivsion as precursor for EOB ADLs. OT Goal: Miscellaneous Goal #1 - Progress: Progressing toward goals Miscellaneous OT Goal #2: Pt will use LUE to reach for and place 5/5 items in container in order to improve cooridnation and awareness as precursor for ADLs. OT Goal: Miscellaneous Goal #2 - Progress: Progressing toward goals Miscellaneous OT Goal #3: Pt will perform static sitting balance task >10 min at sup level as precursor for ADL retraining. OT Goal: Miscellaneous Goal #3 - Progress: Progressing toward goals  Visit Information  Last OT Received On: 06/01/12 Assistance Needed: +2 PT/OT Co-Evaluation/Treatment: Yes    Subjective Data      Prior Functioning       Cognition  Cognition Overall Cognitive Status: Impaired Area of Impairment: Attention;Memory;Awareness of errors;Awareness of deficits;Problem solving;Executive functioning Arousal/Alertness: Awake/alert Orientation Level: Disoriented to;Place;Time;Other (comment) (reoriented to place and date with calendar.) Behavior During Session: Flat affect Current Attention Level: Focused Attention - Other Comments: Pt very distractable.  Once distracted from the task pt needed max cues and sometimes physical cues to resume the task  at  hand. Memory: Decreased recall of precautions Memory Deficits: Pt unable to recall where she lives in [place] but did recall what she had for breakfast, husbands name etc. Following Commands: Follows one step commands with increased time Safety/Judgement: Decreased awareness of need for assistance Awareness of Errors: Assistance required to identify errors made;Assistance required to correct errors made Awareness of Errors - Other Comments: Pt unaware when she is leaning over but with cues was able to correct.  Problem Solving: Increased time and step by step cueing for transfers.   Executive Functioning: severely impaired. Cognition - Other Comments: slow processing, slow reaction time    Mobility  Bed Mobility Bed Mobility: Supine to Sit;Sitting - Scoot to Edge of Bed Supine to Sit: 4: Min assist;With rails;HOB elevated Sitting - Scoot to Edge of Bed: 4: Min guard;Other (comment);With rail (extra time) Details for Bed Mobility Assistance: Pt better with bed mobility today.  Pt needed max cueing to stay on task to do the movement but did not need much hands on assist at all. Transfers Transfers: Sit to Stand;Stand to Sit Sit to Stand: 4: Min assist;With upper extremity assist;From chair/3-in-1 Stand to Sit: 4: Min guard;To chair/3-in-1 Details for Transfer Assistance: increased time needed to stand.  attempted to see if pt would stand on 1...2...3... w/o physical cuing and she did not. Pt did not need physical assist to stand as much as she needed the physical cue of hands under her arms to stand.    Exercises      Balance Balance Balance Assessed: Yes Static Sitting Balance Static Sitting - Balance Support: Feet supported;Bilateral upper extremity supported Static Sitting - Level of Assistance: 5: Stand by assistance Static Sitting - Comment/# of Minutes: 4 Static Standing Balance Static Standing - Balance Support: Bilateral upper extremity supported Static Standing - Level  of Assistance: 4: Min assist Static Standing - Comment/# of Minutes: 10   End of Session OT - End of Session Activity Tolerance: Patient limited by fatigue Patient left: in bed;with call bell/phone within reach Nurse Communication: Mobility status  GO     [name], [name] 06/01/2012, 11:17 AM [phone]

## 2012-06-01 NOTE — Progress Notes (Signed)
Pt for possible future CIR admit.  [name] [id number] continues to follow.

## 2012-06-01 NOTE — Progress Notes (Signed)
PULMONARY  / CRITICAL CARE MEDICINE  Name: [name] MRN: [id number] DOB: [birth year]    ADMISSION DATE:  05/18/2012 CONSULTATION DATE:  3/13  REFERRING MD :  Maryan Rued  CHIEF COMPLAINT:  Cardiac arrest   BRIEF PATIENT DESCRIPTION:  72 year old female w/ h/o ICM (EF 35%), AF,  Recently started on Tikosyn. She was admitted 3/12 with VF arrest & prolonged qT. Team estimates 45 minutes of ACLS to ROSC.  She has a h/o severe AS, mitral regurgitation, aneurysm of the ascending thoracic aorta (status post aortic valve replacement, mitral valve repair, resection and grafting of the ascending thoracic aortic aneurysm on Jul 07, 2006), CAD s/p CABG. Her neuro status improved , course was complicated by Superficial venous thrombosis of left cephalic and basilic veins We are  reconsulted 3/26 for progressive Left lower lobe collapse/consolidation on CXR. She has improved & is able to get ob to chair     SIGNIFICANT EVENTS / STUDIES:  CT head 3/12>>>neg  Echo 3/12>>>EF 35%, diffuse hypokinesis  LINES / TUBES: OETT 3/12>>> out Right IJ CVL 3/12>>> out   CULTURES: Sputum 3/12>>>NTD 3/17 resp >> hafnia  ANTIBIOTICS: Unasyn 3/12>>>stopped 3/26 ceftx >>  SUBJECTIVE:  She has improved & is able to get ob to chair Denies cp, dyspnea Increasing  left upper extremity edema  VITAL SIGNS: Temp:  [97.6 F (36.4 C)-98.5 F (36.9 C)] 98.5 F (36.9 C) (03/27 0424) Pulse Rate:  [66-73] 72 (03/27 0810) Resp:  [18] 18 (03/26 1400) BP: (100-129)/(46-68) 108/67 mmHg (03/27 0810) SpO2:  [96 %-98 %] 98 % (03/27 0424) Weight:  [72.938 kg (160 lb 12.8 oz)] 72.938 kg (160 lb 12.8 oz) (03/27 0424) HEMODYNAMICS:   VENTILATOR SETTINGS:   INTAKE / OUTPUT: Intake/Output     03/26 0701 - 03/27 0700 03/27 0701 - 03/28 0700   P.O. 1314    Total Intake(mL/kg) 1314 (18)    Urine (mL/kg/hr) 2950 (1.7)    Total Output 2950     Net -1636           PHYSICAL EXAMINATION: General:  Chronically   ill  appearing white female, inbed  Neuro: interactive, non focal , dull affect, non verbal HEENT:   No jvd Cardiovascular: RRR Lungs:  LLL rales, no rhonchi  Abdomen:  Soft, non-tender. + bowel sounds  Musculoskeletal:  Intact , LUE forearm & hand swelling Skin:  Intact other than some excoriations in the upper extremities  Dg Chest Port 1 View  05/31/2012  *RADIOLOGY REPORT*  Clinical Data: Elevated CO2.  Chest pain and CHF.  PORTABLE CHEST - 1 VIEW  Comparison: 05/29/2012.  Findings: Trachea is midline.  Heart is enlarged, stable.  Left lower lobe collapse/consolidation appears mildly progressive.  Mild diffuse bilateral air space disease.  Probable left pleural effusion.  IMPRESSION:  1.  Left lower lobe collapse/consolidation appears mildly progressive. 2.  Mild diffuse bilateral air space disease which may be due to edema. 3.  Probable small left pleural effusion.   Original Report Authenticated By: Lorin Picket, M.D.    ASSESSMENT / PLAN: No results found for this basename: PHART, PCO2, PCO2ART, PO2, PO2ART, HCO3, TCO2, O2SAT,  in the last 168 hours  PULMONARY A: Acute respiratory failure s/p cardiac arrest -resolved  LLL atelectasis/ consolidation P:   - oob, mobilize -incentive spirometry -chest vest bid -Doubt pneumonia - WC can rise with atlectasis too - but ok to ct ceftx - low threshold to stop once CXR/ WC improved, dc'd azithro - no  role may prolong qT Repeat c x r 3-28  CARDIOVASCULAR A:  VF arrest Afib  ICM w/ EF 35% Cardiogenic shock Severe AS and mitral valve disease.  Plan: - Amio & coumadin  RENAL  Recent Labs Lab 05/26/12 0500  05/28/12 0435 05/29/12 0431 05/30/12 0525 05/31/12 0555 06/01/12 0534  NA 148*  < > 146* 147* 152* 146* 144  K 4.5  < > 3.8 3.8 4.6 4.2 4.2  CL 106  < > 102 103 105 98 95*  CO2 35*  < > 36* 37* 39* 45* 44*  GLUCOSE 125*  < > 129* 123* 125* 150* 141*  BUN 50*  < > 43* 35* 31* 33* 37*  CREATININE 1.05  < > 1.06 0.95 1.06  1.11* 1.12*  CALCIUM 8.8  < > 9.0 8.9 9.0 8.9 8.9  MG 2.2  --   --  1.8  --   --  1.9  PHOS 4.9*  --   --   --   --   --   --   < > = values in this interval not displayed.  A:  Chronic renal insufficiency  P:   - monitor lytes with lasix  GASTROINTESTINAL  Recent Labs Lab 05/28/12 0435 05/29/12 0431 05/30/12 0525 05/31/12 0555 06/01/12 0534  INR 2.47* 2.44* 2.35* 2.84* 2.83*    A:   GERD Elevated LFTs: likely shock liver, resolved Lipase elevated -resolved dysphagia  P:   - PPI - dys 2 diet  HEMATOLOGIC  Recent Labs Lab 05/29/12 0431 05/31/12 0555 06/01/12 0534  HGB 7.7* 7.5* 7.6*  HCT 25.6* 26.6* 26.4*  WBC 13.9* 17.8* 14.0*  PLT 485* 495* 508*    Recent Labs Lab 05/28/12 0435 05/29/12 0431 05/30/12 0525 05/31/12 0555 06/01/12 0534  INR 2.47* 2.44* 2.35* 2.84* 2.83*   A:   Chronic anemia Warfarin induced coagulopathy [artificial heart valve]  Left superficial thrombosis P:  - does not need long term Ac for superficial thrombus  INFECTIOUS No results found for this basename: LATICACIDVEN, PROCALCITON,  in the last 168 hours A:  Possible aspiration -treated with unasyn. UTI by uc 3-26  P:   -short course ceftx  ENDOCRINE A:  DM P:   - SSI protocol   NEUROLOGIC A:  Acute encephalopathy - post anoxic - resolved  But some residual dull affect P:  Aggressive PT   Richardson Landry Minor ACNP [facility] PCCM Pager [phone] till 3 pm If no answer page [phone] 06/01/2012, 11:49 AM  Baltazar Apo, MD, PhD 06/01/2012, 3:28 PM [facility] [phone] or if no answer [phone]

## 2012-06-02 ENCOUNTER — Inpatient Hospital Stay (HOSPITAL_COMMUNITY): Payer: Medicare Other

## 2012-06-02 LAB — GLUCOSE, CAPILLARY
Glucose-Capillary: 282 mg/dL — ABNORMAL HIGH (ref 70–99)
Glucose-Capillary: 147 mg/dL — ABNORMAL HIGH (ref 70–99)

## 2012-06-02 LAB — BASIC METABOLIC PANEL
CO2: 41 mEq/L (ref 19–32)
Calcium: 8.9 mg/dL (ref 8.4–10.5)
GFR calc Af Amer: 62 mL/min — ABNORMAL LOW (ref >90)
GFR calc non Af Amer: 53 mL/min — ABNORMAL LOW (ref >90)
Sodium: 141 mEq/L (ref 135–145)

## 2012-06-02 LAB — PROTIME-INR
INR: 2.76 — ABNORMAL HIGH (ref 0.00–1.49)
Prothrombin Time: 27.8 seconds — ABNORMAL HIGH (ref 11.6–15.2)

## 2012-06-02 LAB — URINE CULTURE: Colony Count: >=100000

## 2012-06-02 MED ORDER — AMIODARONE HCL 200 MG PO TABS
200.0000 mg | ORAL_TABLET | Freq: Every day | ORAL | Status: DC
  Administered 2012-06-02 – 2012-06-09 (×8): 200 mg via ORAL
  Filled 2012-06-02 (×8): qty 1

## 2012-06-02 NOTE — Progress Notes (Signed)
ANTICOAGULATION CONSULT NOTE - Follow Up Consult  Pharmacy Consult for Coumadin Indication: AVR/MVR, afib, DVT  No Known Allergies  Patient Measurements: Height: 5\' 6"  (167.6 cm) Weight: 162 lb (73.483 kg) IBW/kg (Calculated) : 59.3  Vital Signs: Temp: 98.1 F (36.7 C) (03/28 0702) Temp src: Oral (03/28 0702) BP: 119/66 mmHg (03/28 0749) Pulse Rate: 73 (03/28 0749)  Labs:  Recent Labs  05/31/12 0555 06/01/12 0534 06/02/12 0533  HGB 7.5* 7.6*  --   HCT 26.6* 26.4*  --   PLT 495* 508*  --   LABPROT 28.4* 28.3* 27.8*  INR 2.84* 2.83* 2.76*  CREATININE 1.11* 1.12* 1.03    Estimated Creatinine Clearance: 51.4 ml/min (by C-G formula based on Cr of 1.03).   Assessment: 72yo female admitted s/p VF arrest now s/p arctic sun protocol and recovering.  PMH: anemia, afib, diverticulitis, valvular heart dz, CVA, OP, DM, HTN, GERD, CHF, ICM, CAD, OA  AC: Coum PTA for mechanical AVR and MV repair 08, PAF admit INR 5.05 on 2mg  MWF, 4mg  TTSS.  INR goal 2.5-3.5 per Hochrein note 3/13. Now w/ LUE DVT (superficial venous thrombosis) of the left cephalic and basilic veins. INR 2.7 at goal. No bleeding issues noted.  Noted for possible plans for ICD next week.  Goal of Therapy:  INR 2.5-3.5 Monitor platelets by anticoagulation protocol: Yes   Plan:  Coumadin 3mg  po daily  Change INR to 3x a week  Erin Hearing PharmD., BCPS Clinical Pharmacist Pager [phone] 06/02/2012 9:51 AM

## 2012-06-02 NOTE — Progress Notes (Signed)
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   Ventricular fibrillation   Acute respiratory failure   Warfarin-induced coagulopathy   Acute encephalopathy   Metabolic acidosis   Sustained ventricular fibrillation   Acute on chronic systolic heart failure   Atelectasis    SUBJECTIVE:  [name] is a 72 year old woman with h/o severe AS, mitral regurgitation, aneurysm of the ascending thoracic aorta (status post aortic valve replacement, mitral valve repair, resection and grafting of the ascending thoracic aortic aneurysm on Jul 07, 2006), CAD s/p CABG, AF s/p recent Tikosyn initiation and LV dysfunction who presented after out-of-hospital VF arrest. K 4.4. Mg 2.7. According to her H&P, her husband drove her to [facility] ED and there she required prolonged resuscitation, with multiple drugs and shocks, but pulses returned in about 45 minutes. She was transferred to [facility] ED where she was intubated and sedated. Her initial QT was difficult to assess however, with cooling, she did have marked QT prolongation with recurrent VT/VF.  Alert but somnolent; answers questions; no dyspnea; no chest pain  OBJECTIVE  Filed Vitals:   06/01/12 1712 06/01/12 2112 06/02/12 0702 06/02/12 0749  BP: 106/61 120/47 118/52 119/66  Pulse: 72 68 73 73  Temp:  98.1 F (36.7 C) 98.1 F (36.7 C)   TempSrc:  Oral Oral   Resp:      Height:      Weight:   162 lb (73.483 kg)   SpO2:  95% 96%     Intake/Output Summary (Last 24 hours) at 06/02/12 0805 Last data filed at 06/02/12 0703  Gross per 24 hour  Intake    457 ml  Output   1200 ml  Net    -743 ml   Weight change:   PHYSICAL EXAM  Head: Normocephalic, atraumatic Neck: Supple Lungs: Minimal basilar crackles Heart: RRR II/VI systolic murmur at RUSB Abdomen: Not distended, non-tender Extremities: no lower ext edema; LUE with 1+ edema Neuro: Moves all ext; answers questions appropriately  LABS:  Recent Labs     05/31/12  0555  06/01/12  0534  WBC  17.8*  14.0*  HGB  7.5*  7.6*  HCT  26.6*  26.4*  MCV  91.7  89.2  PLT  495*  508*    Recent Labs Lab 05/31/12 0555 06/01/12 0534 06/02/12 0533  NA 146* 144 141  K 4.2 4.2 4.2  CL 98 95* 96  CO2 45* 44* 41*  BUN 33* 37* 35*  CREATININE 1.11* 1.12* 1.03  CALCIUM 8.9 8.9 8.9  GLUCOSE 150* 141* 155*      Radiology/Studies:  Ct Head Wo Contrast  05/18/2012  *RADIOLOGY REPORT*  Clinical Data: Unresponsive.  Prolonged resuscitation.  Evaluate for hemorrhage.  CT HEAD WITHOUT CONTRAST  Technique:  Contiguous axial images were obtained from the base of the skull through the vertex without contrast.  Comparison: MR brain 06/24/2006 and CT head 06/24/2006.  Findings: No evidence of acute infarct, acute hemorrhage, mass lesion, mass effect or hydrocephalus.  Atrophy.  There is mild periventricular low attenuation.  Tiny remote left basal ganglia lacunar infarct.  Scattered mucosal disease in the paranasal sinuses.  IMPRESSION:  1.  No acute intracranial abnormality. 2.  Atrophy and mild chronic microvascular white matter ischemic changes.   Original Report Authenticated By: Lorin Picket, M.D.    Dg Chest Port 1 View  05/22/2012  *RADIOLOGY REPORT*  Clinical Data: Endotracheal tube position.  PORTABLE CHEST - 1 VIEW  Comparison: 05/21/2012  Findings: Endotracheal tube in place with tip about 5.2 cm above the carina.  Enteric tube has been removed.  Right central venous catheter is unchanged in position.  Cardiac enlargement with normal pulmonary vascularity.  No focal consolidation or airspace disease in the lungs.  No  pneumothorax.  No blunting of costophrenic angles.  Stable appearance of postoperative changes in the mediastinum.  IMPRESSION: Endotracheal tube and right central venous catheters are unchanged in position.  Enteric tube was removed.  Cardiac enlargement.  No developing infiltrates.   Original Report Authenticated By: Lucienne Capers, M.D.    Dg Chest Port 1 View  05/21/2012  *RADIOLOGY REPORT*  Clinical Data: The atrial fibrillation.  Ischemic cardiomyopathy. Acute respiratory failure.  Endotracheal tube placement.  PORTABLE CHEST - 1 VIEW  Comparison: 05/20/2012  Findings: Endotracheal tube remains in appropriate position with the tip approximately 4.5 cm above the carina. The nasogastric has been pulled back with the tip now in the mid thoracic esophagus. Right jugular center venous catheter remains in appropriate position.  Cardiomegaly is stable.  Mild perihilar interstitial edema pattern shows no significant change.  A small bilateral pleural effusions are again noted with left retrocardiac atelectasis.  These findings show no significant change.  IMPRESSION:  1. Nasogastric tube been pulled back with the tip now in the mid thoracic esophagus. 2.  Endotracheal tube in appropriate position. 3.  Stable cardiomegaly and mild interstitial edema. 4.  No significant change in bilateral pleural effusions and left retrocardiac atelectasis.   Original Report Authenticated By: Earle Gell, M.D.    Dg Chest Port 1 View  05/20/2012  *RADIOLOGY REPORT*  Clinical Data: Endotracheal tube placement  PORTABLE CHEST - 1 VIEW  Comparison: 05/18/2012  Findings: Endotracheal tube and right internal jugular central venous catheter are stable.  NG tube placed.  The tip is in the distal esophagus.  Patchy bilateral airspace opacities have improved.  No pneumothorax.  Stable vascular congestion.  IMPRESSION: NG tube placed.  Tip is in the distal esophagus.  Improved bilateral airspace opacities.   Original Report Authenticated  By: Marybelle Killings, M.D.    Dg Chest Port 1 View  05/18/2012  *RADIOLOGY REPORT*  Clinical Data:  Intubation, arrest.  PORTABLE CHEST - 1 VIEW  Comparison: 02/21/2012  Findings: Endotracheal tube is 4-5 cm above the carina.  Right central line tip in the SVC.  No pneumothorax.  Diffuse bilateral airspace disease with cardiomegaly.  No effusions.  No acute bony abnormality.  IMPRESSION: Support devices in expected position as above. No pneumothorax.  Diffuse bilateral airspace disease.   Original Report Authenticated By: Rolm Baptise, M.D.    Dg Abd Portable 1v  05/20/2012  *RADIOLOGY REPORT*  Clinical Data: Vomiting  PORTABLE ABDOMEN - 1 VIEW  Comparison: 03/13/2012  Findings: The tip of the NG tube is at the gastroesophageal junction.  Mild gastric distention with gas.  No disproportionate dilatation of small bowel.  Limited study by technique.  No obvious free intraperitoneal gas.  IMPRESSION: The NG tube tip at the gastroesophageal junction.  No evidence of small bowel obstruction.   Original Report Authenticated By: Marybelle Killings, M.D.     Current Medications:  .  amiodarone  400 mg Oral Daily  . antiseptic oral rinse  15 mL Mouth Rinse BID  . carvedilol  6.25 mg Oral BID WC  . cefTRIAXone (ROCEPHIN)  IV  1 g Intravenous Q24H  . feeding supplement  237 mL Oral BID BM  . feeding supplement  1 Container Oral BID BM  . free water  200 mL Oral Q8H  . insulin aspart  0-9 Units Subcutaneous TID WC  . insulin glargine  10 Units Subcutaneous Q24H  . losartan  25 mg Oral Daily  . pantoprazole  40 mg Oral Daily  . warfarin  3 mg Oral q1800  . Warfarin - Pharmacist Dosing Inpatient   Does not apply q1800    ASSESSMENT AND PLAN:  1. VF arrest - neuro status (A and O; somnelent); ICD when strength improves (Probably Tuesday next week) 2. Prolonged QT, Tikosyn d/c'd  3. PAF - continue amiodarone and coumadin 4. LV dysfunction - Continue coreg and low dose cozaar. Continue to hold lasix today. Resume at  lower dose later as needed based on fu exam. 5. CAD  6. Valvular heart disease s/p bioprosthetic AVR and MV repair 2008  7. Anemia  8. Superficial venous thrombosis, LUE - continue coumadin 9. Acute on chronic systolic CHF - Improved; hold lasix as outlined above. 10. LLL pulmonary infiltrate - much improved with pulmonary toilet. DC antibiotics. 11. Hypernatremia - improved; DC free H20.   Remains weak although better today; encourage nutrition. Mobilize. Needs significant PT; patient must remain on monitor at all times until ICD placed.   [name] 8:05 AM

## 2012-06-02 NOTE — Progress Notes (Signed)
Inpatient Diabetes Program Recommendations  AACE/ADA: New Consensus Statement on Inpatient Glycemic Control (2013)  Target Ranges:  Prepandial:   less than 140 mg/dL      Peak postprandial:   less than 180 mg/dL (1-2 hours)      Critically ill patients:  140 - 180 mg/dL    Results for MANDA, SHOUSE (MRN [id number]) as of 06/02/2012 11:39  Ref. Range 06/01/2012 07:44 06/01/2012 11:39 06/01/2012 16:21 06/01/2012 21:17  Glucose-Capillary Latest Range: 70-99 mg/dL 137 (H) 275 (H) 230 (H) 239 (H)    Patient having elevations in postprandial glucose levels. PO intake poor at present.   MD- Please consider increasing Novolog correction scale (SSI) to Moderate scale tid ac + HS (currently on Sensitive scale)   Will follow.  Wyn Quaker RN, MSN, CDE  Diabetes Coordinator  Inpatient Diabetes Program  [phone]

## 2012-06-02 NOTE — Progress Notes (Signed)
PULMONARY  / CRITICAL CARE MEDICINE  Name: [name] MRN: [id number] DOB: Mar 02, 1941    ADMISSION DATE:  05/18/2012 CONSULTATION DATE:  3/13  REFERRING MD :  Maryan Rued  CHIEF COMPLAINT:  Cardiac arrest   BRIEF PATIENT DESCRIPTION:  72 year old female w/ h/o ICM (EF 35%), AF,  Recently started on Tikosyn. She was admitted 3/12 with VF arrest & prolonged qT. Team estimates 45 minutes of ACLS to ROSC.  She has a h/o severe AS, mitral regurgitation, aneurysm of the ascending thoracic aorta (status post aortic valve replacement, mitral valve repair, resection and grafting of the ascending thoracic aortic aneurysm on Jul 07, 2006), CAD s/p CABG. Her neuro status improved , course was complicated by Superficial venous thrombosis of left cephalic and basilic veins We are  reconsulted 3/26 for progressive Left lower lobe collapse/consolidation on CXR. She has improved & is able to get ob to chair     SIGNIFICANT EVENTS / STUDIES:  CT head 3/12>>>neg  Echo 3/12>>>EF 35%, diffuse hypokinesis  LINES / TUBES: OETT 3/12>>> out Right IJ CVL 3/12>>> out   CULTURES: Sputum 3/12>>>NTD 3/17 resp >> hafnia  ANTIBIOTICS: Unasyn 3/12>>>stopped 3/26 ceftx >>  SUBJECTIVE:  She has improved & is able to get ob to chair Denies cp, dyspnea Increasing  left upper extremity edema  VITAL SIGNS: Temp:  [98.1 F (36.7 C)] 98.1 F (36.7 C) (03/28 0702) Pulse Rate:  [68-76] 76 (03/28 1006) Resp:  [17] 17 (03/27 1400) BP: (101-120)/(33-66) 101/61 mmHg (03/28 1006) SpO2:  [95 %-98 %] 96 % (03/28 0702) Weight:  [73.483 kg (162 lb)] 73.483 kg (162 lb) (03/28 0702) HEMODYNAMICS:   VENTILATOR SETTINGS:   INTAKE / OUTPUT: Intake/Output     03/27 0701 - 03/28 0700 03/28 0701 - 03/29 0700   P.O. 694 120   Total Intake(mL/kg) 694 (9.5) 120 (1.6)   Urine (mL/kg/hr) 650 (0.4) 550 (1.5)   Total Output 650 550   Net +44 -430         PHYSICAL EXAMINATION: General:  Chronically   ill appearing white  female, inbed  Neuro: interactive, non focal ,more awake and interactive HEENT:   No jvd Cardiovascular: RRR Lungs:  LLL rales, no rhonchi  Abdomen:  Soft, non-tender. + bowel sounds  Musculoskeletal:  Intact , LUE forearm & hand swelling Skin:  Intact other than some excoriations in the upper extremities  Dg Chest Port 1 View  06/02/2012  *RADIOLOGY REPORT*  Clinical Data: Chest pain, congestive heart failure.  PORTABLE CHEST - 1 VIEW  Comparison: May 31, 2012.  Findings: Sternotomy wires are noted.  Stable mild cardiomegaly. Pulmonary edema noted on prior exam appears to have improved. Basilar opacity is significantly improved compared to prior exam with minimal left pleural effusion.  IMPRESSION: Significantly improved left basilar opacity compared to prior exam with residual minimal left pleural effusion.  Improved bilateral lung opacities compared to prior exam.   Original Report Authenticated By: Marijo Conception.,  M.D.    ASSESSMENT / PLAN: No results found for this basename: PHART, PCO2, PCO2ART, PO2, PO2ART, HCO3, TCO2, O2SAT,  in the last 168 hours  PULMONARY A: Acute respiratory failure s/p cardiac arrest -resolved  LLL atelectasis/ consolidation >> improved by CXR 3/28 P:   - oob, mobilize -incentive spirometry -chest vest bid -Doubt pneumonia - WC can rise with atelectasis too - but ok to ct ceftx - low threshold to stop once CXR/ WC improved, dc'd azithro - no role may prolong qT  CARDIOVASCULAR A:  VF arrest Afib  ICM w/ EF 35% Cardiogenic shock Severe AS and mitral valve disease.  Plan: - Amio & coumadin - planning for AICD   RENAL  Recent Labs Lab 05/29/12 0431 05/30/12 0525 05/31/12 0555 06/01/12 0534 06/02/12 0533  NA 147* 152* 146* 144 141  K 3.8 4.6 4.2 4.2 4.2  CL 103 105 98 95* 96  CO2 37* 39* 45* 44* 41*  GLUCOSE 123* 125* 150* 141* 155*  BUN 35* 31* 33* 37* 35*  CREATININE 0.95 1.06 1.11* 1.12* 1.03  CALCIUM 8.9 9.0 8.9 8.9 8.9  MG 1.8   --   --  1.9  --     A:  Chronic renal insufficiency  P:   - monitor lytes with lasix  GASTROINTESTINAL  Recent Labs Lab 05/29/12 0431 05/30/12 0525 05/31/12 0555 06/01/12 0534 06/02/12 0533  INR 2.44* 2.35* 2.84* 2.83* 2.76*    A:   GERD Elevated LFTs: likely shock liver, resolved Lipase elevated -resolved dysphagia  P:   - PPI - dys 2 diet  HEMATOLOGIC  Recent Labs Lab 05/29/12 0431 05/31/12 0555 06/01/12 0534  HGB 7.7* 7.5* 7.6*  HCT 25.6* 26.6* 26.4*  WBC 13.9* 17.8* 14.0*  PLT 485* 495* 508*    Recent Labs Lab 05/29/12 0431 05/30/12 0525 05/31/12 0555 06/01/12 0534 06/02/12 0533  INR 2.44* 2.35* 2.84* 2.83* 2.76*   A:   Chronic anemia Warfarin induced coagulopathy [artificial heart valve]  Left superficial thrombosis P:  - does not need long term Ac for superficial thrombus  INFECTIOUS No results found for this basename: LATICACIDVEN, PROCALCITON,  in the last 168 hours A:  Possible aspiration -treated with unasyn. UTI by uc 3-26  P:   -short course ceftx > would d/c 3/29  ENDOCRINE A:  DM P:   - SSI protocol   NEUROLOGIC A:  Acute encephalopathy - post anoxic - resolved  But some residual dull affect. P:  Aggressive PT  Awaits ICD placement. PCCM will see again Monday. 3-31  Richardson Landry Minor ACNP [facility] PCCM Pager [phone] till 3 pm If no answer page [phone] 06/02/2012, 11:59 AM  Baltazar Apo, MD, PhD 06/02/2012, 11:59 AM [facility] [phone] or if no answer [phone]

## 2012-06-02 NOTE — Progress Notes (Signed)
Met with pt and her husband to check in on them. Pt will most likely be accepted into the [facility] rehab.  No other CSW needs at this time.   Danise Edge, [place] Social Worker [phone]

## 2012-06-03 LAB — GLUCOSE, CAPILLARY
Glucose-Capillary: 135 mg/dL — ABNORMAL HIGH (ref 70–99)
Glucose-Capillary: 236 mg/dL — ABNORMAL HIGH (ref 70–99)
Glucose-Capillary: 167 mg/dL — ABNORMAL HIGH (ref 70–99)

## 2012-06-03 LAB — BASIC METABOLIC PANEL
BUN: 35 mg/dL — ABNORMAL HIGH (ref 6–23)
CO2: 36 mEq/L — ABNORMAL HIGH (ref 19–32)
Chloride: 95 mEq/L — ABNORMAL LOW (ref 96–112)
GFR calc non Af Amer: 58 mL/min — ABNORMAL LOW (ref >90)
Glucose, Bld: 136 mg/dL — ABNORMAL HIGH (ref 70–99)
Potassium: 3.7 mEq/L (ref 3.5–5.1)
Sodium: 140 mEq/L (ref 135–145)

## 2012-06-03 LAB — PROTIME-INR: INR: 2.65 — ABNORMAL HIGH (ref 0.00–1.49)

## 2012-06-03 MED ORDER — SODIUM CHLORIDE 0.9 % IJ SOLN: 3.0000 mL | INTRAMUSCULAR | Status: DC | PRN

## 2012-06-03 MED ORDER — FUROSEMIDE 20 MG PO TABS
20.0000 mg | ORAL_TABLET | Freq: Once | ORAL | Status: AC
  Administered 2012-06-03: 20 mg via ORAL
  Filled 2012-06-03: qty 1

## 2012-06-03 MED ORDER — WARFARIN SODIUM 4 MG PO TABS
4.0000 mg | ORAL_TABLET | Freq: Once | ORAL | Status: AC
  Administered 2012-06-03: 4 mg via ORAL
  Filled 2012-06-03: qty 1

## 2012-06-03 MED ORDER — SODIUM CHLORIDE 0.9 % IJ SOLN
3.0000 mL | Freq: Two times a day (BID) | INTRAMUSCULAR | Status: DC
Start: 2012-06-03 — End: 2012-06-08
  Administered 2012-06-03 – 2012-06-08 (×10): 3 mL via INTRAVENOUS

## 2012-06-03 MED ORDER — WARFARIN SODIUM 3 MG PO TABS
3.0000 mg | ORAL_TABLET | Freq: Every day | ORAL | Status: DC
  Administered 2012-06-04: 3 mg via ORAL
  Filled 2012-06-03 (×2): qty 1

## 2012-06-03 NOTE — Progress Notes (Signed)
PULMONARY  / CRITICAL CARE MEDICINE  Name: [name] MRN: [id number] DOB: 01-17-1941    ADMISSION DATE:  05/18/2012 CONSULTATION DATE:  3/13  REFERRING MD :  Maryan Rued  CHIEF COMPLAINT:  Cardiac arrest   BRIEF PATIENT DESCRIPTION:  72 year old female w/ h/o ICM (EF 35%), AF,  Recently started on Tikosyn. She was admitted 3/12 with VF arrest & prolonged qT. Team estimates 45 minutes of ACLS to ROSC.  She has a h/o severe AS, mitral regurgitation, aneurysm of the ascending thoracic aorta (status post aortic valve replacement, mitral valve repair, resection and grafting of the ascending thoracic aortic aneurysm on Jul 07, 2006), CAD s/p CABG. Her neuro status improved , course was complicated by Superficial venous thrombosis of left cephalic and basilic veins We are  reconsulted 3/26 for progressive Left lower lobe collapse/consolidation on CXR. She has improved & is able to get ob to chair     SIGNIFICANT EVENTS / STUDIES:  CT head 3/12>>>neg  Echo 3/12>>>EF 35%, diffuse hypokinesis  LINES / TUBES: OETT 3/12>>> out Right IJ CVL 3/12>>> out   CULTURES: Sputum 3/12>>>NTD 3/17 resp >> hafnia  ANTIBIOTICS: Unasyn 3/12>>>stopped 3/26 ceftx >>  SUBJECTIVE:  Sitting in chair/ nad    VITAL SIGNS: Temp:  [97.8 F (36.6 C)-98.4 F (36.9 C)] 98.4 F (36.9 C) (03/29 0546) Pulse Rate:  [66-77] 75 (03/29 0546) Resp:  [18] 18 (03/28 1424) BP: (101-117)/(41-67) 117/48 mmHg (03/29 0546) SpO2:  [96 %-100 %] 97 % (03/29 0546) Weight:  [163 lb (73.936 kg)] 163 lb (73.936 kg) (03/29 0546) O2 rx 2lpm     INTAKE / OUTPUT: Intake/Output     03/28 0701 - 03/29 0700 03/29 0701 - 03/30 0700   P.O. 360    Total Intake(mL/kg) 360 (4.9)    Urine (mL/kg/hr) 1225 (0.7)    Stool 3 (0)    Total Output 1228     Net -868           PHYSICAL EXAMINATION: General:  Chronically   ill appearing white female, inbed  Neuro: interactive, non focal ,more awake and interactive HEENT:   No  jvd Cardiovascular: RRR Lungs:  LLL rales, no rhonchi  Abdomen:  Soft, non-tender. + bowel sounds  Musculoskeletal:  Intact , LUE forearm & hand swelling Skin:  Intact other than some excoriations in the upper extremities  Dg Chest Port 1 View  06/02/2012  *RADIOLOGY REPORT*  Clinical Data: Chest pain, congestive heart failure.  PORTABLE CHEST - 1 VIEW  Comparison: May 31, 2012.  Findings: Sternotomy wires are noted.  Stable mild cardiomegaly. Pulmonary edema noted on prior exam appears to have improved. Basilar opacity is significantly improved compared to prior exam with minimal left pleural effusion.  IMPRESSION: Significantly improved left basilar opacity compared to prior exam with residual minimal left pleural effusion.  Improved bilateral lung opacities compared to prior exam.   Original Report Authenticated By: Marijo Conception.,  M.D.    ASSESSMENT / PLAN: No results found for this basename: PHART, PCO2, PCO2ART, PO2, PO2ART, HCO3, TCO2, O2SAT,  in the last 168 hours  PULMONARY A: Acute respiratory failure s/p cardiac arrest -resolved  LLL atelectasis/ consolidation >> improved by CXR 3/28 P:   - oob, mobilize -incentive spirometry reviewed technique with pt 3/29 -chest vest bid -Doubt pneumonia - WC can rise with atelectasis too - but ok to ct ceftx - low threshold to stop once CXR/ WC improved   CARDIOVASCULAR A:  VF arrest Afib  ICM w/ EF 35% Cardiogenic shock Severe AS and mitral valve disease.  Plan: - Amio & coumadin - planning for AICD   RENAL  Recent Labs Lab 05/29/12 0431 05/30/12 0525 05/31/12 0555 06/01/12 0534 06/02/12 0533 06/03/12 0500  NA 147* 152* 146* 144 141 140  K 3.8 4.6 4.2 4.2 4.2 3.7  CL 103 105 98 95* 96 95*  CO2 37* 39* 45* 44* 41* 36*  GLUCOSE 123* 125* 150* 141* 155* 136*  BUN 35* 31* 33* 37* 35* 35*  CREATININE 0.95 1.06 1.11* 1.12* 1.03 0.96  CALCIUM 8.9 9.0 8.9 8.9 8.9 8.8  MG 1.8  --   --  1.9  --   --     A:  Chronic  renal insufficiency  P:   - monitor lytes with lasix  GASTROINTESTINAL  Recent Labs Lab 05/30/12 0525 05/31/12 0555 06/01/12 0534 06/02/12 0533 06/03/12 0500  INR 2.35* 2.84* 2.83* 2.76* 2.65*    A:   GERD Elevated LFTs: likely shock liver, resolved Lipase elevated -resolved dysphagia  P:   - PPI - dys 2 diet  HEMATOLOGIC  Recent Labs Lab 05/29/12 0431 05/31/12 0555 06/01/12 0534  HGB 7.7* 7.5* 7.6*  HCT 25.6* 26.6* 26.4*  WBC 13.9* 17.8* 14.0*  PLT 485* 495* 508*    Recent Labs Lab 05/30/12 0525 05/31/12 0555 06/01/12 0534 06/02/12 0533 06/03/12 0500  INR 2.35* 2.84* 2.83* 2.76* 2.65*   A:   Chronic anemia Warfarin induced coagulopathy [artificial heart valve]  Left superficial thrombosis P:  - does not need long term Ac for superficial thrombus  INFECTIOUS No results found for this basename: LATICACIDVEN, PROCALCITON,  in the last 168 hours A:  Possible aspiration -treated with unasyn. UTI by uc 3-26  P:   -short course ceftx > would d/c 3/29  ENDOCRINE A:  DM P:   - SSI protocol   NEUROLOGIC A:  Acute encephalopathy - post anoxic - resolved  But some residual dull affect. P:  Aggressive PT  Awaits ICD placement. PCCM will see again Monday. 3-31  Christinia Gully, MD Pulmonary and [place] [phone] After 5:30 PM or weekends, call [phone]

## 2012-06-03 NOTE — Progress Notes (Signed)
ANTICOAGULATION CONSULT NOTE - Follow Up Consult  Pharmacy Consult for Coumadin Indication: AVR/MVR, afib, DVT  No Known Allergies  Patient Measurements: Height: 5\' 6"  (167.6 cm) Weight: 163 lb (73.936 kg) IBW/kg (Calculated) : 59.3  Vital Signs: Temp: 98.4 F (36.9 C) (03/29 0546) Temp src: Oral (03/29 0546) BP: 117/48 mmHg (03/29 0546) Pulse Rate: 75 (03/29 0546)  Labs:  Recent Labs  06/01/12 0534 06/02/12 0533 06/03/12 0500  HGB 7.6*  --   --   HCT 26.4*  --   --   PLT 508*  --   --   LABPROT 28.3* 27.8* 27.0*  INR 2.83* 2.76* 2.65*  CREATININE 1.12* 1.03  --     Estimated Creatinine Clearance: 51.5 ml/min (by C-G formula based on Cr of 1.03).   Assessment: 72yo female admitted s/p VF arrest now s/p arctic sun protocol and recovering.  PMH: anemia, afib, diverticulitis, valvular heart dz, CVA, OP, DM, HTN, GERD, CHF, ICM, CAD, OA  AC: Coum PTA for mechanical AVR and MV repair 08, PAF admit INR 5.05 on 2mg  MWF, 4mg  TTSS.  INR goal 2.5-3.5 per Hochrein note 3/13. Now w/ LUE DVT (superficial venous thrombosis) of the left cephalic and basilic veins. INR 2.6 at goal slight trend down. No bleeding issues noted.  Noted for possible plans for ICD next week.  Goal of Therapy:  INR 2.5-3.5 Monitor platelets by anticoagulation protocol: Yes   Plan:  Coumadin 4mg  po today then back to 3mg  daily Change INR to 3x a week  Erin Hearing PharmD., BCPS Clinical Pharmacist Pager [phone] 06/03/2012 8:07 AM

## 2012-06-03 NOTE — Progress Notes (Signed)
Subjective:  Patient is alert, sitting up in chair.  Nasal oxygen in place. Denies chest pain or dyspnea.  No dizziness or syncope.  Objective:  Vital Signs in the last 24 hours: Temp:  [97.8 F (36.6 C)-98.4 F (36.9 C)] 98.4 F (36.9 C) (03/29 0546) Pulse Rate:  [66-77] 75 (03/29 0546) Resp:  [18] 18 (03/28 1424) BP: (101-117)/(41-67) 117/48 mmHg (03/29 0546) SpO2:  [96 %-100 %] 97 % (03/29 0546) Weight:  [163 lb (73.936 kg)] 163 lb (73.936 kg) (03/29 0546)  Intake/Output from previous day: 03/28 0701 - 03/29 0700 In: 360 [P.O.:360] Out: 1228 [Urine:1225; Stool:3] Intake/Output from this shift:    . amiodarone  200 mg Oral Daily  . antiseptic oral rinse  15 mL Mouth Rinse BID  . carvedilol  6.25 mg Oral BID WC  . feeding supplement  237 mL Oral BID BM  . feeding supplement  1 Container Oral BID BM  . insulin aspart  0-9 Units Subcutaneous TID WC  . insulin glargine  10 Units Subcutaneous Q24H  . losartan  25 mg Oral Daily  . pantoprazole  40 mg Oral Daily  . sodium chloride  3 mL Intravenous Q12H  . [START ON 06/04/2012] warfarin  3 mg Oral q1800  . warfarin  4 mg Oral ONCE-1800  . Warfarin - Pharmacist Dosing Inpatient   Does not apply q1800      Physical Exam: The patient appears to be in no distress.Nasal oxygen. Pale.  Head and neck exam reveals that the pupils are equal and reactive.  The extraocular movements are full.  There is no scleral icterus.  Mouth and pharynx are benign.  No lymphadenopathy.  No carotid bruits.  The jugular venous pressure is normal.  Thyroid is not enlarged or tender.  Chest is clear to percussion and auscultation.  No rales or rhonchi.  Expansion of the chest is symmetrical.  Heart reveals grade 2/6 systolic ejection murmur at base.  The abdomen is soft and nontender.  Bowel sounds are normoactive.  There is no hepatosplenomegaly or mass.  There are no abdominal bruits.  Extremities reveal trace edema.  Pedal pulses are good.   There is no cyanosis or clubbing.  Neurologic exam is normal strength and no lateralizing weakness.  No sensory deficits.  Integument reveals no rash  Lab Results:  Recent Labs  06/01/12 0534  WBC 14.0*  HGB 7.6*  PLT 508*    Recent Labs  06/02/12 0533 06/03/12 0500  NA 141 140  K 4.2 3.7  CL 96 95*  CO2 41* 36*  GLUCOSE 155* 136*  BUN 35* 35*  CREATININE 1.03 0.96   No results found for this basename: TROPONINI, CK, MB,  in the last 72 hours Hepatic Function Panel No results found for this basename: PROT, ALBUMIN, AST, ALT, ALKPHOS, BILITOT, BILIDIR, IBILI,  in the last 72 hours No results found for this basename: CHOL,  in the last 72 hours No results found for this basename: PROTIME,  in the last 72 hours  Imaging: Dg Chest Port 1 View  06/02/2012  *RADIOLOGY REPORT*  Clinical Data: Chest pain, congestive heart failure.  PORTABLE CHEST - 1 VIEW  Comparison: May 31, 2012.  Findings: Sternotomy wires are noted.  Stable mild cardiomegaly. Pulmonary edema noted on prior exam appears to have improved. Basilar opacity is significantly improved compared to prior exam with minimal left pleural effusion.  IMPRESSION: Significantly improved left basilar opacity compared to prior exam with residual minimal left  pleural effusion.  Improved bilateral lung opacities compared to prior exam.   Original Report Authenticated By: Marijo Conception.,  M.D.     Cardiac Studies: Telemetry reviewed. Rhythm is NSR.  Occasional paired PVCs. Occasional short runs of SVT. Assessment/Plan:  1. VF arrest - neuro status improved. ICD when strength improves (Probably Tuesday next week)  2. Prolonged QT, Tikosyn d/c'd  3. PAF - continue amiodarone and coumadin.  Maintaining NSR.  4. LV dysfunction - Continue coreg and low dose cozaar. Weight up 3 lb in past 2 days. Will give Lasix 20 mg today. 5. CAD  6. Valvular heart disease s/p bioprosthetic AVR and MV repair 2008  7. Anemia. Pallor noted. Will  recheck CBC in am 8. Superficial venous thrombosis, LUE - continue coumadin  9. Acute on chronic systolic CHF - improving. 10. LLL pulmonary infiltrate - much improved with pulmonary toilet. DC antibiotics.  11. Hypernatremia - improved; DC free H20.   LOS: 16 days    Darlin Coco 06/03/2012, 12:42 PM

## 2012-06-04 LAB — BASIC METABOLIC PANEL
CO2: 37 mEq/L — ABNORMAL HIGH (ref 19–32)
Calcium: 8.5 mg/dL (ref 8.4–10.5)
Creatinine, Ser: 0.97 mg/dL (ref 0.50–1.10)
GFR calc non Af Amer: 57 mL/min — ABNORMAL LOW (ref >90)
Sodium: 140 mEq/L (ref 135–145)

## 2012-06-04 LAB — CBC
MCH: 26.6 pg (ref 26.0–34.0)
MCHC: 30.4 g/dL (ref 30.0–36.0)
WBC: 11.2 10*3/uL — ABNORMAL HIGH (ref 4.0–10.5)

## 2012-06-04 LAB — GLUCOSE, CAPILLARY
Glucose-Capillary: 208 mg/dL — ABNORMAL HIGH (ref 70–99)
Glucose-Capillary: 221 mg/dL — ABNORMAL HIGH (ref 70–99)
Glucose-Capillary: 212 mg/dL — ABNORMAL HIGH (ref 70–99)

## 2012-06-04 MED ORDER — YOU HAVE A PACEMAKER BOOK
Freq: Once | Status: AC
  Administered 2012-06-04: 18:00:00
  Filled 2012-06-04: qty 1

## 2012-06-04 NOTE — Progress Notes (Signed)
Primary cardiologist: Dr. [name]  Subjective:   No chest pain or breathlessness. Still weak. Has had some constipation.   Objective:   Temp:  [97.9 F (36.6 C)-98.5 F (36.9 C)] 97.9 F (36.6 C) (03/30 0527) Pulse Rate:  [69-77] 74 (03/30 0527) Resp:  [20] 20 (03/30 0527) BP: (96-111)/(58-65) 111/64 mmHg (03/30 0527) SpO2:  [94 %-100 %] 95 % (03/30 0527) Last BM Date: 06/03/12  Filed Weights   06/01/12 0424 06/02/12 0702 06/03/12 0546  Weight: 160 lb 12.8 oz (72.938 kg) 162 lb (73.483 kg) 163 lb (73.936 kg)    Intake/Output Summary (Last 24 hours) at 06/04/12 1132 Last data filed at 06/04/12 0800  Gross per 24 hour  Intake    100 ml  Output    551 ml  Net   -451 ml   Telemetry: Sinus rhythm with occasional PVCs.  Exam:  General: NAD.  Lungs: Clear, nonlabored.  Cardiac: RRR, no gallop.  Extremities: No pitting.  Lab Results:  Basic Metabolic Panel:  Recent Labs Lab 05/29/12 0431  06/01/12 0534 06/02/12 0533 06/03/12 0500 06/04/12 0614  NA 147*  < > 144 141 140 140  K 3.8  < > 4.2 4.2 3.7 4.0  CL 103  < > 95* 96 95* 96  CO2 37*  < > 44* 41* 36* 37*  GLUCOSE 123*  < > 141* 155* 136* 136*  BUN 35*  < > 37* 35* 35* 36*  CREATININE 0.95  < > 1.12* 1.03 0.96 0.97  CALCIUM 8.9  < > 8.9 8.9 8.8 8.5  MG 1.8  --  1.9  --   --   --   < > = values in this interval not displayed.   CBC:  Recent Labs Lab 05/31/12 0555 06/01/12 0534 06/04/12 0614  WBC 17.8* 14.0* 11.2*  HGB 7.5* 7.6* 7.6*  HCT 26.6* 26.4* 25.0*  MCV 91.7 89.2 87.4  PLT 495* 508* 449*    Coagulation:  Recent Labs Lab 06/01/12 0534 06/02/12 0533 06/03/12 0500  INR 2.83* 2.76* 2.65*    Medications:   Scheduled Medications: . amiodarone  200 mg Oral Daily  . antiseptic oral rinse  15 mL Mouth Rinse BID  . carvedilol  6.25 mg Oral BID WC  . feeding supplement  237 mL Oral BID BM  . feeding supplement  1 Container Oral BID BM  . insulin aspart  0-9 Units  Subcutaneous TID WC  . insulin glargine  10 Units Subcutaneous Q24H  . losartan  25 mg Oral Daily  . pantoprazole  40 mg Oral Daily  . sodium chloride  3 mL Intravenous Q12H  . warfarin  3 mg Oral q1800  . Warfarin - Pharmacist Dosing Inpatient   Does not apply q1800     PRN Medications:  fentaNYL, sodium chloride   Assessment:   1. VF arrest - neuro status improved. ICD when strength improves.  2. Prolonged QT, Tikosyn d/c'd   3. PAF - continue amiodarone and coumadin. Maintaining NSR.   4. LV dysfunction, LVEF 30-40% - On coreg and cozaar.  5. CAD   6. 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 Anemia. Hgb relatively stable at 7.6.  8. Superficial venous thrombosis, LUE, on Coumadin   9. Acute on chronic systolic CHF - improving.    Plan/Discussion:    Continue to work on strength, needs further PT. Might benefit from PRBC transfusion followed by Lasix given persistently low hemoglobin. Ultimately  plan ICD.   Satira Sark, M.D., F.A.C.C.

## 2012-06-05 LAB — BASIC METABOLIC PANEL
CO2: 33 mEq/L — ABNORMAL HIGH (ref 19–32)
Calcium: 8.5 mg/dL (ref 8.4–10.5)
GFR calc Af Amer: 68 mL/min — ABNORMAL LOW (ref >90)
Sodium: 137 mEq/L (ref 135–145)

## 2012-06-05 LAB — CBC
Hemoglobin: 7.3 g/dL — ABNORMAL LOW (ref 12.0–15.0)
MCH: 26.3 pg (ref 26.0–34.0)
MCHC: 30.2 g/dL (ref 30.0–36.0)
Platelets: 432 10*3/uL — ABNORMAL HIGH (ref 150–400)
RDW: 15.9 % — ABNORMAL HIGH (ref 11.5–15.5)

## 2012-06-05 LAB — GLUCOSE, CAPILLARY: Glucose-Capillary: 135 mg/dL — ABNORMAL HIGH (ref 70–99)

## 2012-06-05 LAB — PROTIME-INR
INR: 3.8 — ABNORMAL HIGH (ref 0.00–1.49)
Prothrombin Time: 35.2 seconds — ABNORMAL HIGH (ref 11.6–15.2)

## 2012-06-05 MED ORDER — ACETAMINOPHEN 325 MG PO TABS
650.0000 mg | ORAL_TABLET | Freq: Once | ORAL | Status: AC
  Administered 2012-06-05: 650 mg via ORAL
  Filled 2012-06-05: qty 2

## 2012-06-05 MED ORDER — POTASSIUM CHLORIDE CRYS ER 20 MEQ PO TBCR
40.0000 meq | EXTENDED_RELEASE_TABLET | Freq: Once | ORAL | Status: AC
  Administered 2012-06-05: 40 meq via ORAL
  Filled 2012-06-05: qty 2

## 2012-06-05 MED ORDER — FUROSEMIDE 10 MG/ML IJ SOLN
40.0000 mg | Freq: Once | INTRAMUSCULAR | Status: AC
  Administered 2012-06-05: 40 mg via INTRAVENOUS
  Filled 2012-06-05: qty 4

## 2012-06-05 NOTE — Progress Notes (Signed)
NUTRITION FOLLOW UP  Intervention:    Continue Ensure Complete twice daily (350 kcals, 13 gm protein per 8 fl oz bottle)  Continue Magic Cup dessert supplement with meals (290 kcals, 9 gm protein per 4 oz cup)  Continue Ensure Pudding twice daily (170 kcals, 4 gm protein per 4 oz cup)  Nutrition Dx:   Inadequate oral intake related to poor appetite as evidenced by PO intake 0-50%, ongoing  Goal:   Oral intake with meals & supplements to meet >/= 90% of estimated nutrition needs, progressing  Monitor:   PO & supplemental intake, weight, labs, I/O's  Assessment:   Patient reports her appetite is "not good;" PO intake per flowsheet records is variable at 0-50%.  She is taking at least 2 Ensure Complete supplements per day, 1-2 Magic Cup supplements per day.  Patient hopes to eat better once she is home from the hospital.  Noted for ICD placement tomorrow, transfer to CIR if bed available.  Height: Ht Readings from Last 1 Encounters:  05/18/12 5\' 6"  (1.676 m)    Weight Status:   Wt Readings from Last 1 Encounters:  06/03/12 163 lb (73.936 kg)    Re-estimated needs:  Kcal: 1650-1850 Protein: 75-90 gm Fluid: 1.6-1.8 L  Skin: Intact  Diet Order: Dysphagia 2, thin liquids   Intake/Output Summary (Last 24 hours) at 06/05/12 1257 Last data filed at 06/05/12 0700  Gross per 24 hour  Intake    160 ml  Output    400 ml  Net   -240 ml    Labs:   Recent Labs Lab 06/01/12 0534  06/03/12 0500 06/04/12 0614 06/05/12 0501  NA 144  < > 140 140 137  K 4.2  < > 3.7 4.0 3.6  CL 95*  < > 95* 96 95*  CO2 44*  < > 36* 37* 33*  BUN 37*  < > 35* 36* 33*  CREATININE 1.12*  < > 0.96 0.97 0.95  CALCIUM 8.9  < > 8.8 8.5 8.5  MG 1.9  --   --   --   --   GLUCOSE 141*  < > 136* 136* 127*  < > = values in this interval not displayed.  CBG (last 3)   Recent Labs  06/04/12 2127 06/05/12 0749 06/05/12 1156  GLUCAP 212* 150* 135*    Scheduled Meds: . acetaminophen  650 mg  Oral Once  . amiodarone  200 mg Oral Daily  . antiseptic oral rinse  15 mL Mouth Rinse BID  . carvedilol  6.25 mg Oral BID WC  . feeding supplement  237 mL Oral BID BM  . feeding supplement  1 Container Oral BID BM  . furosemide  40 mg Intravenous Once  . insulin aspart  0-9 Units Subcutaneous TID WC  . insulin glargine  10 Units Subcutaneous Q24H  . losartan  25 mg Oral Daily  . pantoprazole  40 mg Oral Daily  . sodium chloride  3 mL Intravenous Q12H  . Warfarin - Pharmacist Dosing Inpatient   Does not apply q1800    Continuous Infusions:   Arthur Holms, RD, LDN Pager #: [phone] After-Hours Pager #: [phone]

## 2012-06-05 NOTE — Progress Notes (Signed)
Occupational Therapy Treatment Patient Details Name: [name] MRN: [id number] DOB: Aug 15, 1940 Today's Date: 06/05/2012 Time: [id number] OT Time Calculation (min): 38 min  OT Assessment / Plan / Recommendation Comments on Treatment Session Pt demonstrates incr progress to one person assist with cognition deficits and balance deficits. Pt remains excellent CIR candidate at this time.    Follow Up Recommendations  CIR    Barriers to Discharge       Equipment Recommendations  3 in 1 bedside comode    Recommendations for Other Services Rehab consult  Frequency Min 2X/week   Plan Discharge plan remains appropriate    Precautions / Restrictions Precautions Precautions: Fall Restrictions Weight Bearing Restrictions: No   Pertinent Vitals/Pain Fatigued and constant feeling of needing to void bowels    ADL  Grooming: Wash/dry face;Teeth care;Moderate assistance (terminates task prematurely) Where Assessed - Grooming: Unsupported standing (leaning against sink level for support) Lower Body Bathing: Moderate assistance Where Assessed - Lower Body Bathing: Supported sit to Lobbyist: Minimal Print production planner Method: Sit to Loss adjuster, chartered: Raised toilet seat with arms (or 3-in-1 over toilet) Toileting - Clothing Manipulation and Hygiene: Moderate assistance Where Assessed - Toileting Clothing Manipulation and Hygiene: Sit to stand from 3-in-1 or toilet Equipment Used: Rolling walker Transfers/Ambulation Related to ADLs: Pt able to walk to sink from recliner with Min (A) and when stopping has a posterior lean that requires MIn (A) to correct. Pt with hgb 7.3 with pending transfusion but very willing to participate. ADL Comments: Pt requesting 3n1 upon standing. pt without void however smearing present . Pt attempting to wipe and needs assistance due to smearing and not fully cleaning the buttock area. Pt ambulated to sink with heavy lean on surface  for stability. Pt terminating task after 3 brushes requesting 3n1 again. Pt needed extended time to sequence oral care and hand washing. Pt with small stool second attempt. Barrier cream applied to buttock. Pt able to initiate and maintain static squat for hygiene for 30seconds x2 .     OT Diagnosis:    OT Problem List:   OT Treatment Interventions:     OT Goals Acute Rehab OT Goals OT Goal Formulation: With patient/family Time For Goal Achievement: 06/08/12 Potential to Achieve Goals: Good ADL Goals Pt Will Perform Grooming: with supervision;Sitting, chair;Sitting, edge of bed;Unsupported ADL Goal: Grooming - Progress: Progressing toward goals Pt Will Perform Upper Body Bathing: with supervision;Sitting, chair;Sitting, edge of bed;Unsupported Pt Will Perform Lower Body Bathing: with min assist;Sitting, chair;Sitting, edge of bed;Unsupported ADL Goal: Lower Body Bathing - Progress: Progressing toward goals Pt Will Transfer to Toilet: with supervision;Stand pivot transfer;with DME;3-in-1 Miscellaneous OT Goals Miscellaneous OT Goal #1: Pt will perform bed mobility with superivsion as precursor for EOB ADLs. Miscellaneous OT Goal #2: Pt will use LUE to reach for and place 5/5 items in container in order to improve cooridnation and awareness as precursor for ADLs. OT Goal: Miscellaneous Goal #2 - Progress: Progressing toward goals Miscellaneous OT Goal #3: Pt will perform static sitting balance task >10 min at sup level as precursor for ADL retraining. OT Goal: Miscellaneous Goal #3 - Progress: Progressing toward goals  Visit Information  Last OT Received On: 06/05/12 Assistance Needed: +1 PT/OT Co-Evaluation/Treatment: Yes    Subjective Data      Prior Functioning       Cognition  Cognition Overall Cognitive Status: Appears within functional limits for tasks assessed/performed Arousal/Alertness: Awake/alert Orientation Level: Appears intact for tasks assessed Behavior  During  Session: [facility] for tasks performed Current Attention Level: Alternating Following Commands: Follows one step commands with increased time Cognition - Other Comments: slow processing, - made errors but needed extended time to recognize then correct    Mobility  Bed Mobility Bed Mobility: Not assessed Transfers Sit to Stand: 4: Min assist;With upper extremity assist;From chair/3-in-1 Stand to Sit: 4: Min guard Details for Transfer Assistance: vc's for hand placement; assist to come forward.    Exercises      Balance Balance Balance Assessed: Yes Static Standing Balance Static Standing - Balance Support: Left upper extremity supported;Right upper extremity supported;During functional activity Static Standing - Level of Assistance: Other (comment) Dynamic Standing Balance Dynamic Standing - Balance Support: During functional activity;Left upper extremity supported;Right upper extremity supported Dynamic Standing - Level of Assistance:  (min guard hand supported on sink to brush teeth/wash hands))   End of Session OT - End of Session Activity Tolerance: Patient tolerated treatment well Patient left: in chair;with call bell/phone within reach;with family/visitor present Nurse Communication: Mobility status;Precautions  GO     Veneda Melter 06/05/2012, 5:15 PM Pager: [phone]

## 2012-06-05 NOTE — Progress Notes (Signed)
PULMONARY  / CRITICAL CARE MEDICINE  Name: [name] MRN: [id number] DOB: 18-Oct-1940    ADMISSION DATE:  05/18/2012  BRIEF PATIENT DESCRIPTION:  72 yo female admitted with VF arrest with VDRF, prolonged QT complicated by aspiration.  Pt transferred to SDU and PCCM signed off 3/20.  Asked to re-assess 3/26 due to LLL ATX with hypoxia.  CULTURES: Sputum 3/17 >> Hafnia alvei Urine 3/26 >> Enterobacter cloacae  ANTIBIOTICS: Unasyn 3/13 >> 3/20  Rocephin 3/26 >> 3/27   SUBJECTIVE:  Denies cough, wheeze, sputum, chest pain, or chest congestion.  Getting PRBC transfusion.   VITAL SIGNS: Temp:  [98.1 F (36.7 C)-99.2 F (37.3 C)] 98.1 F (36.7 C) (03/31 1710) Pulse Rate:  [63-71] 64 (03/31 1710) Resp:  [18-20] 20 (03/31 1710) BP: (100-119)/(28-63) 105/52 mmHg (03/31 1710) SpO2:  [93 %-100 %] 99 % (03/31 1710) Room air    INTAKE / OUTPUT: Intake/Output     03/30 0701 - 03/31 0700 03/31 0701 - 04/01 0700   P.O. 360 50   Blood  25   Total Intake(mL/kg) 360 (4.9) 75 (1)   Urine (mL/kg/hr) 400 (0.2)    Stool     Total Output 400     Net -40 +75        Stool Occurrence 1 x 1 x    PHYSICAL EXAMINATION: General: No distress Neuro: normal strength HEENT: No sinus tenderness Cardiovascular: s1s2 Lungs: no wheeze/rales  Abdomen:  Soft, non-tender Musculoskeletal: ankle edema Skin: no rashes  Lab Results  Component Value Date   WBC 9.2 06/05/2012   HGB 7.3* 06/05/2012   HCT 24.2* 06/05/2012   MCV 87.1 06/05/2012   PLT 432* 06/05/2012   Lab Results  Component Value Date   CREATININE 0.95 06/05/2012   BUN 33* 06/05/2012   NA 137 06/05/2012   K 3.6 06/05/2012   CL 95* 06/05/2012   CO2 33* 06/05/2012   Lab Results  Component Value Date   ALT 209* 05/24/2012   AST 110* 05/24/2012   ALKPHOS 113 05/24/2012   BILITOT 0.4 05/24/2012   CBG (last 3)   Recent Labs  06/05/12 0749 06/05/12 1156 06/05/12 1645  GLUCAP 150* 135* 147*    Dg Chest Port 1 View  06/02/2012   *RADIOLOGY REPORT*  Clinical Data: Chest pain, congestive heart failure.  PORTABLE CHEST - 1 VIEW  Comparison: May 31, 2012.  Findings: Sternotomy wires are noted.  Stable mild cardiomegaly. Pulmonary edema noted on prior exam appears to have improved. Basilar opacity is significantly improved compared to prior exam with minimal left pleural effusion.  IMPRESSION: Significantly improved left basilar opacity compared to prior exam with residual minimal left pleural effusion.  Improved bilateral lung opacities compared to prior exam.   Original Report Authenticated By: Marijo Conception.,  M.D.    Dg Chest Port 1 View  05/31/2012  *RADIOLOGY REPORT*  Clinical Data: Elevated CO2.  Chest pain and CHF.  PORTABLE CHEST - 1 VIEW  Comparison: 05/29/2012.  Findings: Trachea is midline.  Heart is enlarged, stable.  Left lower lobe collapse/consolidation appears mildly progressive.  Mild diffuse bilateral air space disease.  Probable left pleural effusion.  IMPRESSION:  1.  Left lower lobe collapse/consolidation appears mildly progressive. 2.  Mild diffuse bilateral air space disease which may be due to edema. 3.  Probable small left pleural effusion.   Original Report Authenticated By: Lorin Picket, M.D.    ASSESSMENT / PLAN:  Lt lower lung atelectasis >> clinically and radiographically  improved; unlikely related to pneumonia. Plan: Continue bronchial hygiene Oxygen as needed to keep SpO2 > 92% F/u CXR as needed for worsening respiratory symptoms/oxygenation Monitor off Abx  VF arrest, A fib, cardiomyopathy, valvular heart disease. Plan: Per cardiology  Anemia. Plan: Per primary team  Dysphagia. Hx of GERD. Plan: D2 diet Continue protonix  DM type II. Plan: SSI per primary team  Respiratory status stable, and she is now on room air.  PCCM will sign off.  Please call if additional help needed.  Chesley Mires, MD [facility] Pulmonary/Critical Care 06/05/2012, 5:36 PM Pager:  [phone] After  3pm call: [phone]

## 2012-06-05 NOTE — Progress Notes (Signed)
Met with patient this morning. She is eager to come to inpatient rehab. Noted that ICD placement planned possible for tomorrow. Plan is for pt to come to CIR after ICD. Patient is aware that CIR admission will depend on bed availability at that time.  For questions call [phone]

## 2012-06-05 NOTE — Progress Notes (Signed)
Physical Therapy Treatment Patient Details Name: [name] MRN: [id number] DOB: [birth year] Today's Date: 06/05/2012 Time: [id number] PT Time Calculation (min): 38 min  PT Assessment / Plan / Recommendation Comments on Treatment Session  Much improved mentation, less distractability. Remains weak with decr activity tolerance, but mobilizing with 1 person assist.    Follow Up Recommendations  CIR     Does the patient have the potential to tolerate intense rehabilitation     Barriers to Discharge        Equipment Recommendations  Rolling walker with 5" wheels    Recommendations for Other Services    Frequency Min 3X/week   Plan Discharge plan remains appropriate;Frequency remains appropriate    Precautions / Restrictions Precautions Precautions: Fall Restrictions Weight Bearing Restrictions: No   Pertinent Vitals/Pain     Mobility  Transfers Transfers: Sit to Stand;Stand to Sit Sit to Stand: 4: Min assist;With upper extremity assist;From chair/3-in-1 Stand to Sit: 4: Min guard Details for Transfer Assistance: vc's for hand placement; assist to come forward. Ambulation/Gait Ambulation/Gait Assistance: 4: Min assist Ambulation Distance (Feet): 22 Feet (to/from bsc to sink, then 33 feet from bsc to hall and back) Assistive device: Rolling walker Ambulation/Gait Assistance Details: v/tc's for postural corrections; min for steady assist Gait Pattern: Step-through pattern;Shuffle;Trunk flexed Gait velocity: decreased Wheelchair Mobility Wheelchair Mobility: No    Exercises     PT Diagnosis:    PT Problem List:   PT Treatment Interventions:     PT Goals Acute Rehab PT Goals Time For Goal Achievement: 06/07/12 Potential to Achieve Goals: Good Pt will Sit at [facility] of Bed: with supervision PT Goal: Sit at [facility] Of Bed - Progress: Progressing toward goal Pt will go Sit to Stand: with supervision PT Goal: Sit to Stand - Progress: Progressing toward goal Pt will go Stand  to Sit: with supervision PT Goal: Stand to Sit - Progress: Progressing toward goal Pt will Stand: with supervision;with bilateral upper extremity support;3 - 5 min PT Goal: Stand - Progress: Progressing toward goal Pt will Ambulate: 51 - 150 feet;with supervision;with least restrictive assistive device PT Goal: Ambulate - Progress: Progressing toward goal  Visit Information  Last PT Received On: 06/05/12 Assistance Needed: +1    Subjective Data      Cognition  Cognition Overall Cognitive Status: Appears within functional limits for tasks assessed/performed Arousal/Alertness: Awake/alert Orientation Level: Appears intact for tasks assessed Behavior During Session: [facility] for tasks performed Current Attention Level: Alternating Following Commands: Follows one step commands consistently    Balance  Balance Balance Assessed: Yes Static Standing Balance Static Standing - Balance Support: Left upper extremity supported;Right upper extremity supported;During functional activity Static Standing - Level of Assistance: Other (comment) (min guard) Dynamic Standing Balance Dynamic Standing - Balance Support: During functional activity;Left upper extremity supported;Right upper extremity supported Dynamic Standing - Level of Assistance: Other (comment) (min guard  hand supported on sink to brush teeth/wash hands)  End of Session PT - End of Session Activity Tolerance: Patient tolerated treatment well Patient left: in chair;with call bell/phone within reach Nurse Communication: Mobility status   GP     Koji Niehoff, Tessie Fass 06/05/2012, 4:27 PM 06/05/2012  Donnella Sham, [place] [phone] (pager)

## 2012-06-05 NOTE — Progress Notes (Addendum)
Primary cardiologist: Dr. [name]  Subjective:   No chest pain or breathlessness. Strength improving; complains of constipation    Objective:   Temp:  [98 F (36.7 C)-99.2 F (37.3 C)] 98.1 F (36.7 C) (03/31 0612) Pulse Rate:  [63-72] 71 (03/31 0612) Resp:  [18-20] 19 (03/31 0612) BP: (100-110)/(42-64) 100/42 mmHg (03/31 0612) SpO2:  [94 %-96 %] 96 % (03/31 0612) Last BM Date: 06/04/12  Filed Weights   06/01/12 0424 06/02/12 0702 06/03/12 0546  Weight: 160 lb 12.8 oz (72.938 kg) 162 lb (73.483 kg) 163 lb (73.936 kg)    Intake/Output Summary (Last 24 hours) at 06/05/12 [id number] Last data filed at 06/04/12 1758  Gross per 24 hour  Intake    260 ml  Output    400 ml  Net   -140 ml   Telemetry: Sinus rhythm with occasional PVCs; SVT  Exam:  General: NAD.  Neck: supple  Lungs: Clear, nonlabored.  Cardiac: RRR, no gallop.  Abd: soft  Extremities: No edema  Lab Results:  Basic Metabolic Panel:  Recent Labs Lab 06/01/12 0534  06/03/12 0500 06/04/12 0614 06/05/12 0501  NA 144  < > 140 140 137  K 4.2  < > 3.7 4.0 3.6  CL 95*  < > 95* 96 95*  CO2 44*  < > 36* 37* 33*  GLUCOSE 141*  < > 136* 136* 127*  BUN 37*  < > 35* 36* 33*  CREATININE 1.12*  < > 0.96 0.97 0.95  CALCIUM 8.9  < > 8.8 8.5 8.5  MG 1.9  --   --   --   --   < > = values in this interval not displayed.   CBC:  Recent Labs Lab 06/01/12 0534 06/04/12 0614 06/05/12 0501  WBC 14.0* 11.2* 9.2  HGB 7.6* 7.6* 7.3*  HCT 26.4* 25.0* 24.2*  MCV 89.2 87.4 87.1  PLT 508* 449* 432*    Coagulation:  Recent Labs Lab 06/02/12 0533 06/03/12 0500 06/05/12 0501  INR 2.76* 2.65* 3.80*    Medications:   Scheduled Medications: . amiodarone  200 mg Oral Daily  . antiseptic oral rinse  15 mL Mouth Rinse BID  . carvedilol  6.25 mg Oral BID WC  . feeding supplement  237 mL Oral BID BM  . feeding supplement  1 Container Oral BID BM  . insulin aspart  0-9 Units Subcutaneous TID WC  .  insulin glargine  10 Units Subcutaneous Q24H  . losartan  25 mg Oral Daily  . pantoprazole  40 mg Oral Daily  . sodium chloride  3 mL Intravenous Q12H  . warfarin  3 mg Oral q1800  . Warfarin - Pharmacist Dosing Inpatient   Does not apply q1800    PRN Medications: fentaNYL, sodium chloride   Assessment:   1. VF arrest - neuro status improved. ICD when strength improves; possibly tomorrow.  2. Prolonged QT, Tikosyn d/c'd   3. PAF - continue amiodarone and coumadin. Maintaining NSR.   4. LV dysfunction, LVEF 30-40% - On coreg and cozaar.  5. CAD   6. 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 Anemia. Hgb 7.3; follow  8. Superficial venous thrombosis, LUE, on Coumadin   9. Acute on chronic systolic CHF - improving.    Plan/Discussion:    Continue to work on strength, needs further PT. Will review with EP; plan ICD (possibly tomorrow)   [name], M.D., F.A.C.C. Discussed with Dr Caryl Comes; will  transfuse prior to ICD implant. Hold coumadin today prior to procedure. [name]

## 2012-06-06 LAB — GLUCOSE, CAPILLARY
Glucose-Capillary: 270 mg/dL — ABNORMAL HIGH (ref 70–99)
Glucose-Capillary: 96 mg/dL (ref 70–99)

## 2012-06-06 LAB — TYPE AND SCREEN
ABO/RH(D): A NEG
Unit division: 0

## 2012-06-06 LAB — BASIC METABOLIC PANEL
CO2: 32 mEq/L (ref 19–32)
Calcium: 8.6 mg/dL (ref 8.4–10.5)
Creatinine, Ser: 0.97 mg/dL (ref 0.50–1.10)

## 2012-06-06 LAB — CBC
MCH: 27.2 pg (ref 26.0–34.0)
MCV: 84.2 fL (ref 78.0–100.0)
Platelets: 394 10*3/uL (ref 150–400)
RDW: 15.7 % — ABNORMAL HIGH (ref 11.5–15.5)
WBC: 8.1 10*3/uL (ref 4.0–10.5)

## 2012-06-06 LAB — PROTIME-INR: Prothrombin Time: 37.2 seconds — ABNORMAL HIGH (ref 11.6–15.2)

## 2012-06-06 MED ORDER — FLEET ENEMA 7-19 GM/118ML RE ENEM
1.0000 | ENEMA | Freq: Once | RECTAL | Status: AC
  Administered 2012-06-06: 1 via RECTAL
  Filled 2012-06-06 (×2): qty 1

## 2012-06-06 MED ORDER — POLYETHYLENE GLYCOL 3350 17 G PO PACK
17.0000 g | PACK | Freq: Every day | ORAL | Status: DC
  Administered 2012-06-06 – 2012-06-09 (×3): 17 g via ORAL
  Filled 2012-06-06 (×4): qty 1

## 2012-06-06 NOTE — Progress Notes (Addendum)
Primary cardiologist: Dr. [name]  Subjective:   No chest pain or breathlessness. Strength improving;     Objective:   Temp:  [97.1 F (36.2 C)-98.7 F (37.1 C)] 98.5 F (36.9 C) (04/01 0617) Pulse Rate:  [59-71] 70 (04/01 0617) Resp:  [16-20] 16 (04/01 0617) BP: (94-123)/(28-76) 123/67 mmHg (04/01 0617) SpO2:  [93 %-100 %] 96 % (04/01 0617) Weight:  [156 lb 6.4 oz (70.943 kg)] 156 lb 6.4 oz (70.943 kg) (04/01 0617) Last BM Date: 06/05/12  Filed Weights   06/02/12 0702 06/03/12 0546 06/06/12 0617  Weight: 162 lb (73.483 kg) 163 lb (73.936 kg) 156 lb 6.4 oz (70.943 kg)    Intake/Output Summary (Last 24 hours) at 06/06/12 0700 Last data filed at 06/06/12 [id number]  Gross per 24 hour  Intake   1086 ml  Output   1650 ml  Net   -564 ml   Telemetry: Sinus rhythm with occasional PVCs; SVT  Exam:  General: NAD.  Neck: supple  Lungs: Clear, nonlabored.  Cardiac: RRR, no gallop.  Abd: soft  Extremities: No edema  Lab Results:  Basic Metabolic Panel:  Recent Labs Lab 06/01/12 0534  06/04/12 0614 06/05/12 0501 06/06/12 0532  NA 144  < > 140 137 139  K 4.2  < > 4.0 3.6 3.9  CL 95*  < > 96 95* 98  CO2 44*  < > 37* 33* 32  GLUCOSE 141*  < > 136* 127* 105*  BUN 37*  < > 36* 33* 26*  CREATININE 1.12*  < > 0.97 0.95 0.97  CALCIUM 8.9  < > 8.5 8.5 8.6  MG 1.9  --   --   --   --   < > = values in this interval not displayed.   CBC:  Recent Labs Lab 06/04/12 0614 06/05/12 0501 06/06/12 0532  WBC 11.2* 9.2 8.1  HGB 7.6* 7.3* 10.0*  HCT 25.0* 24.2* 31.0*  MCV 87.4 87.1 84.2  PLT 449* 432* 394    Coagulation:  Recent Labs Lab 06/02/12 0533 06/03/12 0500 06/05/12 0501  INR 2.76* 2.65* 3.80*    Medications:   Scheduled Medications: . amiodarone  200 mg Oral Daily  . antiseptic oral rinse  15 mL Mouth Rinse BID  . carvedilol  6.25 mg Oral BID WC  . feeding supplement  237 mL Oral BID BM  . feeding supplement  1 Container Oral BID BM  .  insulin aspart  0-9 Units Subcutaneous TID WC  . insulin glargine  10 Units Subcutaneous Q24H  . losartan  25 mg Oral Daily  . pantoprazole  40 mg Oral Daily  . sodium chloride  3 mL Intravenous Q12H  . Warfarin - Pharmacist Dosing Inpatient   Does not apply q1800    PRN Medications: fentaNYL, sodium chloride   Assessment:   1. VF arrest - neuro status improved. ICD when INR improves  2. Prolonged QT, Tikosyn d/c'd   3. PAF - continue amiodarone and coumadin. Maintaining NSR.   4. LV dysfunction, LVEF 30-40% - On coreg and cozaar.  5. CAD   6. 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 Anemia. Hgb improved with transfusion  8. Superficial venous thrombosis, LUE, on Coumadin   9. Acute on chronic systolic CHF - improved   Plan/Discussion:    Continue to work on strength, needs further PT. Will check INR today; if improved (approximately 2.5), ICD today; otherwise will delay. After ICD, transfer to  rehab. Note coumadin on hold; resume at lower dose as INR improves.   [name], M.D., F.A.C.C.

## 2012-06-06 NOTE — Progress Notes (Signed)
Patient complaining of abdominal pain and trouble having a bowel movement.  Checked for impaction, felt stool at rectum soft and pasty.  Rosaria Ferries PA paged and notified, orders received.  Will continue to monitor.  Sanda Linger

## 2012-06-07 LAB — CBC
HCT: 31.8 % — ABNORMAL LOW (ref 36.0–46.0)
MCH: 27.3 pg (ref 26.0–34.0)
MCHC: 32.1 g/dL (ref 30.0–36.0)
MCV: 85.3 fL (ref 78.0–100.0)
RDW: 16.1 % — ABNORMAL HIGH (ref 11.5–15.5)

## 2012-06-07 LAB — BASIC METABOLIC PANEL
BUN: 28 mg/dL — ABNORMAL HIGH (ref 6–23)
Calcium: 8.9 mg/dL (ref 8.4–10.5)
Creatinine, Ser: 0.91 mg/dL (ref 0.50–1.10)
GFR calc Af Amer: 72 mL/min — ABNORMAL LOW (ref >90)
GFR calc non Af Amer: 62 mL/min — ABNORMAL LOW (ref >90)
Glucose, Bld: 135 mg/dL — ABNORMAL HIGH (ref 70–99)

## 2012-06-07 LAB — GLUCOSE, CAPILLARY

## 2012-06-07 MED ORDER — CHLORHEXIDINE GLUCONATE 4 % EX LIQD
60.0000 mL | Freq: Once | CUTANEOUS | Status: AC
  Administered 2012-06-08: 4 via TOPICAL
  Filled 2012-06-07: qty 60

## 2012-06-07 MED ORDER — CHLORHEXIDINE GLUCONATE 4 % EX LIQD
60.0000 mL | Freq: Once | CUTANEOUS | Status: AC
  Administered 2012-06-07: 4 via TOPICAL
  Filled 2012-06-07: qty 60

## 2012-06-07 MED ORDER — SODIUM CHLORIDE 0.9 % IR SOLN
80.0000 mg | Status: DC
  Filled 2012-06-07 (×2): qty 2

## 2012-06-07 MED ORDER — CEFAZOLIN SODIUM-DEXTROSE 2-3 GM-% IV SOLR
2.0000 g | INTRAVENOUS | Status: DC
  Filled 2012-06-07 (×2): qty 50

## 2012-06-07 MED ORDER — CEFAZOLIN SODIUM-DEXTROSE 2-3 GM-% IV SOLR
2.0000 g | INTRAVENOUS | Status: DC
  Filled 2012-06-07: qty 50

## 2012-06-07 MED ORDER — SODIUM CHLORIDE 0.9 % IV SOLN
INTRAVENOUS | Status: DC
  Administered 2012-06-08: 11:00:00 via INTRAVENOUS

## 2012-06-07 NOTE — Progress Notes (Signed)
Inpatient Diabetes Program Recommendations  AACE/ADA: New Consensus Statement on Inpatient Glycemic Control (2013)  Target Ranges:  Prepandial:   less than 140 mg/dL      Peak postprandial:   less than 180 mg/dL (1-2 hours)      Critically ill patients:  140 - 180 mg/dL    Results for [name], [name] (MRN [id number]) as of 06/07/2012 11:35  Ref. Range 06/06/2012 07:33 06/06/2012 11:26 06/06/2012 16:30 06/06/2012 21:37  Glucose-Capillary Latest Range: 70-99 mg/dL 124 (H) 270 (H) 96 303 (H)    MD- Please consider adding low dose Novolog meal coverage         Novolog 3 units tid with meals   Will follow. Wyn Quaker RN, MSN, CDE Diabetes Coordinator Inpatient Diabetes Program [phone]

## 2012-06-07 NOTE — Progress Notes (Signed)
Physical Therapy Treatment Patient Details Name: [name] MRN: [id number] DOB: March 24, 1940 Today's Date: 06/07/2012 Time: [id number] PT Time Calculation (min): 23 min  PT Assessment / Plan / Recommendation Comments on Treatment Session  Feel progress continues with pt able to walk further, though stayed in room.  Planned ICD implantation tomorrow and will hopefully get to CIR after that.  Still feel she can benefit from intensive multidisciplinary inpatient stay to maximize independence and safety prior to d/c home.    Follow Up Recommendations  CIR     Does the patient have the potential to tolerate intense rehabilitation   yes  Barriers to Discharge  n/a      Equipment Recommendations  Rolling walker with 5" wheels       Frequency Min 3X/week   Plan Discharge plan remains appropriate    Precautions / Restrictions Precautions Precautions: Fall Precaution Comments: LUE DVT, on heparin, thoracic and lumbar compression fractures   Pertinent Vitals/Pain Denies pain     Mobility  Bed Mobility Bed Mobility: Scooting to HOB Supine to Sit: 5: Supervision;HOB elevated;With rails Sitting - Scoot to Edge of Bed: 5: Supervision;With rail Scooting to [place]: 1: +2 Total assist Scooting to [facility]: Patient Percentage: 20% Details for Bed Mobility Assistance: assist once in bed for positioning Transfers Sit to Stand: 4: Min assist;With upper extremity assist;From chair/3-in-1;From bed;With armrests Stand to Sit: 4: Min assist;To chair/3-in-1;To bed Stand Pivot Transfers: 4: Min assist Details for Transfer Assistance: pivot to [facility] did not use RW, unsteady on feet; needed min cues for safety Ambulation/Gait Ambulation/Gait Assistance: 4: Min assist Ambulation Distance (Feet): 120 Feet Assistive device: Rolling walker Ambulation/Gait Assistance Details: stayed in room at pt's request; multiple trips to and from door with turns and assist needed for safety with walker management around  obstacles and with turns. Gait Pattern: Step-through pattern;Trunk flexed;Shuffle    Exercises General Exercises - Lower Extremity Long Arc Quad: AROM;Both;10 reps;Seated Hip Flexion/Marching: AROM;Both;10 reps;Seated Toe Raises: AROM;Seated;10 reps;Both Heel Raises: AROM;Seated;10 reps;Both Other Exercises Other Exercises: sit<>stand x 5 reps for LE strength and activity tolerance Other Exercises: seated trunk extensions with scapular squeezes x 5 reps 5 second hold     PT Goals Acute Rehab PT Goals Time For Goal Achievement: 06/14/12 (date extended for goals) Potential to Achieve Goals: Good Pt will Roll Supine to Right Side: with modified independence PT Goal: Rolling Supine to Right Side - Progress: Updated due to goal met Pt will go Supine/Side to Sit: with modified independence;with HOB 0 degrees PT Goal: Supine/Side to Sit - Progress: Updated due to goal met Pt will Sit at Edge of Bed: Independently PT Goal: Sit at Edge Of Bed - Progress: Updated due to goal met Pt will go Sit to Supine/Side: with supervision PT Goal: Sit to Supine/Side - Progress: Progressing toward goal Pt will go Sit to Stand: with supervision PT Goal: Sit to Stand - Progress: Progressing toward goal Pt will Ambulate: 51 - 150 feet;with supervision;with least restrictive assistive device PT Goal: Ambulate - Progress: Progressing toward goal Pt will Perform Home Exercise Program: with supervision, verbal cues required/provided PT Goal: Perform Home Exercise Program - Progress: Progressing toward goal  Visit Information  Last PT Received On: 06/07/12    Subjective Data  Subjective: I will try.  Been getting laxitives.  Don't want to go far.   Cognition  Cognition Overall Cognitive Status: Appears within functional limits for tasks assessed/performed Arousal/Alertness: Awake/alert Orientation Level: Appears intact for tasks assessed Behavior During  Session: [facility] for tasks performed    Balance      End of Session PT - End of Session Equipment Utilized During Treatment: Gait belt Activity Tolerance: Other (comment) (limited due to bowel issues) Patient left: in bed;with call bell/phone within reach;with family/visitor present   GP     [facility] 06/07/2012, 5:07 PM Magda Kiel, [place] 06/07/2012

## 2012-06-07 NOTE — PMR Pre-admission (Addendum)
PMR Admission Coordinator Pre-Admission Assessment  Patient: [name] is an 72 y.o., female MRN: [id number] DOB: [birth year] Height: 5\' 6"  (167.6 cm) Weight: 72.485 kg (159 lb 12.8 oz)              Insurance Information Primary: Medicare Policy#: [id number] Subscriber: Patient Benefits: [place] Effective Date: 05/07/06 Deductible: $1126 OOP Max: None Life Max: Unlimited CIR: 100% SNF: 100 days LBD:  Outpatient: 80% Copay: 20%  Home Health: 100% DME: 80%  Copay: 20%  SECONDARY: BCBS      Policy#: [id number]      Subscriber: self   Emergency Contact Information Contact Information   Name Relation Home Work Mobile   [place] Spouse [phone]  [phone]     Current Medical History  Patient Admitting Diagnosis: Deconditioning after cardiac arrest, respiratory failure, multiple medical issues  3/19/14History of Present Illness: 72 y.o. right-handed female with history of atrial fibrillation on chronic Coumadin therapy, CVA 2008, severe aortic stenosis and mitral regurgitation, chronic systolic congestive heart failure and ischemic cardiomyopathy. Admitted 05/18/2012 after becoming unresponsive while riding in the car. On arrival to the ED was found to be in VF arrest. Team estimated 45 minutes of ACLS.  INR upon admission of greater than 5.00 and did receive fresh frozen plasma. Cranial CT scan showed no acute abnormalities. Question aspiration pneumonia and placed on Unasyn. Cardiology services consulted maintained on Levophed for pressure support. Troponin less than 0.30. EKG demonstrating wide QRS with prolonged QT. Patient maintained on ventilatory support. Upper extremity Dopplers 05/22/2012 did show findings consistent with superficial vein thrombosis left cephalic vein left basilic vein. Patient currently maintained on intravenous heparin as well as Coumadin therapy resumed. She remained intubated from 05/18/2012 at 05/23/2012. She is currently n.p.o. with tube feedings and  speech therapy planning swallow study. 4/4 /14 UPDATE: 3/24: Acute on chronic systolic CHF - patient is volume overloaded; chest xray yesterday shows CHF 3/25: Leukocytosis - afebrile; diminished BS LLL.  3/26: UTI 4/3: Due to persistent atrial fibrillation as well as bradycardia patient underwent placement of ICD 06/08/2012 per Dr. Rayann Heman. 4/4: Patient with chronic anemia 7.4 to 7.9 and monitored. Her diet was slowly advanced to a regular consistency. INR sub therapeutic today at 1.95 Currently on IV Anceph.  Past Medical History  Past Medical History  Diagnosis Date  . Anemia   . Atrial fibrillation     A.fib/flutter: s/p MAZE 2008, DCCV 2011, on coumadin; previously on flecainide, but dc'd due to worsening EF. Tikosyn discontinued 06/2012 after cardiac arrest.  . Diverticulitis   . CVA (cerebral vascular accident)     2008 - felt due to oscillating calcium on aortic valve  . Carcinoma in situ of vulva   . Fibroid   . Ovarian cyst, left 2008-2009  . Osteoporosis   . Diabetes mellitus   . Hypertension   . GERD (gastroesophageal reflux disease)   . Chronic systolic CHF (congestive heart failure)     EF 35%  . Ischemic cardiomyopathy     EF 35%  . Coronary artery disease     Occluded LM 2/2 previous aortic root surgery; s/p 2v CABG 2010 LIMA to LAD, SVG to OM; Cath 03/11/12 patent SVG to OM, atretic LIMA to LAD, patent RCA, occluded native LM, EF 35%   . Arthritis     OSTEO  . Fracture of lumbar spine   . Ventricular fibrillation     VF arrest/VT/torsades 06/2012 with prolonged hosp with VDRF, cardiogen shock, asp PNA, encephalopathy, anemia, shock  liver, hypernatremia - s/p ICD implantation 06/08/12.  . Superficial venous thrombosis of upper extremity     06/2012 - LUE  . Severe aortic stenosis     s/p homograft aortic root replacement 2008  . Mitral regurgitation     s/p mitral valve repair 2008  . Thoracic aortic aneurysm     s/p resection and grafting 2008    Family History   family history includes Breast cancer in her maternal aunt; Cancer in her paternal grandmother; and Hypertension in her mother.  Prior Rehab/Hospitalizations: no   Current Medications  Current facility-administered medications:0.9 %  sodium chloride infusion, 250 mL, Intravenous, PRN, Thompson Grayer, MD;  acetaminophen (TYLENOL) tablet 325-650 mg, 325-650 mg, Oral, Q4H PRN, Thompson Grayer, MD;  amiodarone (PACERONE) tablet 200 mg, 200 mg, Oral, Daily, Lelon Perla, MD, 200 mg at 06/09/12 0939;  carvedilol (COREG) tablet 6.25 mg, 6.25 mg, Oral, BID WC, Lelon Perla, MD, 6.25 mg at 06/09/12 0846 ceFAZolin (ANCEF) IVPB 1 g/50 mL premix, 1 g, Intravenous, Q6H, Lelon Perla, MD, 1 g at 06/09/12 [id number];  feeding supplement (ENSURE COMPLETE) liquid 237 mL, 237 mL, Oral, BID BM, Sherre Scarlet, RD, 237 mL at 06/09/12 [id number];  feeding supplement (ENSURE) pudding 1 Container, 1 Container, Oral, BID BM, Sherre Scarlet, RD, 1 Container at 06/07/12 1000 HYDROcodone-acetaminophen (NORCO/VICODIN) 5-325 MG per tablet 1-2 tablet, 1-2 tablet, Oral, Q4H PRN, Thompson Grayer, MD;  insulin aspart (novoLOG) injection 0-9 Units, 0-9 Units, Subcutaneous, TID WC, Dayna N Dunn, PA-C, 1 Units at 06/09/12 0848;  insulin glargine (LANTUS) injection 10 Units, 10 Units, Subcutaneous, Q24H, Lelon Perla, MD, 10 Units at 06/09/12 0849 losartan (COZAAR) tablet 25 mg, 25 mg, Oral, Daily, Lelon Perla, MD, 25 mg at 06/09/12 0940;  ondansetron (ZOFRAN) injection 4 mg, 4 mg, Intravenous, Q6H PRN, Thompson Grayer, MD;  pantoprazole (PROTONIX) EC tablet 40 mg, 40 mg, Oral, Daily, Rogelia Mire, NP, 40 mg at 06/09/12 0940;  polyethylene glycol (MIRALAX / GLYCOLAX) packet 17 g, 17 g, Oral, Daily, Rhonda G Barrett, PA-C, 17 g at 06/09/12 0941 sodium chloride 0.9 % injection 3 mL, 3 mL, Intravenous, Q12H, Thompson Grayer, MD, 3 mL at 06/09/12 0942;  sodium chloride 0.9 % injection 3 mL, 3 mL, Intravenous, PRN, Thompson Grayer, MD, 3 mL at  06/09/12 0943;  warfarin (COUMADIN) tablet 3 mg, 3 mg, Oral, ONCE-1800, Dareen Piano, [facility];  Warfarin - Pharmacist Dosing Inpatient, , Does not apply, q1800, Arman Bogus, [facility]  Patients Current Diet: Carb Control  Precautions / Restrictions Precautions Precautions: Fall Precaution Comments: LUE DVT, on heparin, thoracic and lumbar compression fractures Restrictions Weight Bearing Restrictions: No   Prior Activity Level Community (5-7x/wk): Active daily Home Assistive Devices / Equipment Home Assistive Devices/Equipment: Eyeglasses;CBG Meter Home Adaptive Equipment: None  Prior Functional Level Prior Function Level of Independence: Independent Able to Take Stairs?: Yes Driving: Yes Vocation: Retired Comments: did get out of breath with long distances of walking  Current Functional Level Cognition  Arousal/Alertness: Awake/alert Overall Cognitive Status: Impaired Overall Cognitive Status: Appears within functional limits for tasks assessed/performed Current Attention Level: Alternating Attention - Other Comments: Pt very distractable.  Once distracted from the task pt needed max cues and sometimes physical cues to resume the task at hand. Memory: Decreased recall of precautions Memory Deficits: Pt unable to recall where she lives in [place] but did recall what she had for breakfast, husbands name etc. Orientation Level: Oriented X4 Following Commands: Follows one  step commands with increased time Safety/Judgement: Decreased awareness of need for assistance Awareness of Errors: Assistance required to identify errors made;Assistance required to correct errors made Awareness of Errors - Other Comments: Pt unaware when she is leaning over but with cues was able to correct.  Awareness of Deficits:   Executive Functioning: severely impaired. Cognition - Other Comments: slow processing, - made errors but needed extended time to recognize then correct Attention:  Sustained Sustained Attention: Impaired Sustained Attention Impairment: Verbal basic;Functional basic Memory: Impaired Memory Impairment: Storage deficit;Retrieval deficit;Decreased recall of new information;Decreased short term memory;Prospective memory Decreased Short Term Memory: Verbal basic;Functional basic Awareness: Impaired Awareness Impairment: Intellectual impairment;Emergent impairment;Anticipatory impairment Problem Solving: Impaired Problem Solving Impairment: Verbal basic;Functional basic Executive Function: Self Correcting;Self Monitoring;Initiating;Organizing;Decision Making Organizing: Impaired Safety/Judgment: Impaired    Extremity Assessment (includes Sensation/Coordination)  RUE ROM/Strength/Tone: WFL for tasks assessed RUE Sensation: WFL - Light Touch;WFL - Proprioception RUE Coordination: WFL - gross/fine motor  RLE ROM/Strength/Tone: Deficits RLE ROM/Strength/Tone Deficits: generally weak, grossly 3/5  RLE Sensation: WFL - Light Touch RLE Coordination: WFL - gross/fine motor    ADLs  Grooming: Wash/dry face;Teeth care;Moderate assistance (terminates task prematurely) Where Assessed - Grooming: Unsupported standing (leaning against sink level for support) Upper Body Bathing: Simulated;Moderate assistance Where Assessed - Upper Body Bathing: Supported sitting Lower Body Bathing: Moderate assistance Where Assessed - Lower Body Bathing: Supported sit to stand Upper Body Dressing: Simulated;Moderate assistance Where Assessed - Upper Body Dressing: Supported sitting Lower Body Dressing: Performed;Maximal assistance Where Assessed - Lower Body Dressing: Supported sitting Toilet Transfer: Minimal assistance Toilet Transfer Method: Sit to stand Toilet Transfer Equipment: Raised toilet seat with arms (or 3-in-1 over toilet) Toileting - Clothing Manipulation and Hygiene: Moderate assistance Where Assessed - Toileting Clothing Manipulation and Hygiene: Sit to stand  from 3-in-1 or toilet Equipment Used: Rolling walker Transfers/Ambulation Related to ADLs: Pt able to walk to sink from recliner with Min (A) and when stopping has a posterior lean that requires MIn (A) to correct. Pt with hgb 7.3 with pending transfusion but very willing to participate. ADL Comments: Pt requesting 3n1 upon standing. pt without void however smearing present . Pt attempting to wipe and needs assistance due to smearing and not fully cleaning the buttock area. Pt ambulated to sink with heavy lean on surface for stability. Pt terminating task after 3 brushes requesting 3n1 again. Pt needed extended time to sequence oral care and hand washing. Pt with small stool second attempt. Barrier cream applied to buttock. Pt able to initiate and maintain static squat for hygiene for 30seconds x2 .     Mobility  Bed Mobility: Scooting to HOB Supine to Sit: 5: Supervision;HOB elevated;With rails Sitting - Scoot to Edge of Bed: 5: Supervision;With rail Scooting to [place]: 1: +2 Total assist Scooting to [facility]: Patient Percentage: 20%    Transfers  Transfers: Sit to Stand;Stand to Sit Sit to Stand: 4: Min assist;With upper extremity assist;From chair/3-in-1;From bed;With armrests Sit to Stand: Patient Percentage: 60% Stand to Sit: 4: Min assist;To chair/3-in-1;To bed Stand to Sit: Patient Percentage: 60% Stand Pivot Transfers: 4: Min assist    Ambulation / Gait / Stairs / Emergency planning/management officer  Ambulation/Gait Ambulation/Gait Assistance: 4: Min assist Ambulation/Gait: Patient Percentage: 60% Ambulation Distance (Feet): 120 Feet Assistive device: Rolling walker Ambulation/Gait Assistance Details: stayed in room at pt's request; multiple trips to and from door with turns and assist needed for safety with walker management around obstacles and with turns. Gait Pattern: Step-through pattern;Trunk flexed;Shuffle Gait velocity:  decreased General Gait Details: VCs to correct flexed posture, HHA of 2 for  balance Stairs: No Wheelchair Mobility Wheelchair Mobility: No    Posture / Balance Static Sitting Balance Static Sitting - Balance Support: Feet supported;Bilateral upper extremity supported Static Sitting - Level of Assistance: 5: Stand by assistance Static Sitting - Comment/# of Minutes: 4 Static Standing Balance Static Standing - Balance Support: Left upper extremity supported;Right upper extremity supported;During functional activity Static Standing - Level of Assistance: Other (comment) Static Standing - Comment/# of Minutes: 10 min working on upright standing tolerance, reaching, w/shifting Dynamic Standing Balance Dynamic Standing - Balance Support: During functional activity;Left upper extremity supported;Right upper extremity supported Dynamic Standing - Level of Assistance:  (min guard hand supported on sink to brush teeth/wash hands))    Special needs/care consideration Continuous Drip IV: no, IV Ancef Oxygen: no Skin: sacrum - reddened with maceration                              Bowel mgmt:incontinent at times. Pt reports being distressed constipation in hospital. Bladder mgmt:incontinent at times Diabetic mgmt: yes    Previous Home Environment Living Arrangements: Alone Lives With: Spouse Available Help at Discharge: Family;Available 24 hours/day Type of Home: House Home Layout: One level Home Access: Stairs to enter [place]: No  Discharge Living Setting Plans for Discharge Living Setting: Patient's home;Lives with husband. Type of Home at Discharge: House Discharge Home Layout: One level 2 steps to enter with no rails. Patient reports her bed is high  Do you have any problems obtaining your medications?: No  Social/Family/Support Systems Patient Roles: Spouse  (No children) Contact Information: [phone] Anticipated Caregiver: husband: Cydnie [place] (husband has been providing care for pt during past illnesses) Anticipated Caregiver's Contact  Information: [phone] Ability/Limitations of Caregiver: mod assist Caregiver Availability: 24/7 Discharge Plan Discussed with Primary Caregiver: Yes Is Caregiver In Agreement with Plan?: Yes Does Caregiver/Family have Issues with Lodging/Transportation while Pt is in Rehab?: No  Goals/Additional Needs Patient/Family Goal for Rehab: PT&OT: supervision Expected length of stay: 5-7 days Cultural Considerations: none Dietary Needs: Dys 2 Equipment Needs: TBD Pt/Family Agrees to Admission and willing to participate: Yes Program Orientation Provided & Reviewed with Pt/Caregiver Including Roles  & Responsibilities: Yes   Decrease burden of Care through IP rehab admission: Diet advancement, Decrease number of caregivers, Bowel and bladder program and Patient/family education  Possible need for SNF placement upon discharge: No  Patient Condition: This patient's medical and functional status has changed since the consult dated: 05/24/12 in which the Rehabilitation Physician determined and documented that the patient's condition is appropriate for intensive rehabilitative care in an inpatient rehabilitation facility. See "History of Present Illness" (above) for medical update. Functional changes are: Patient is min assist with ambulation due to balance issues and min-mod with ADLs. Patient's medical and functional status update has been discussed with the Rehabilitation physician and patient remains appropriate for inpatient rehabilitation. Will admit to inpatient rehab today.  Preadmission Screen Completed By:  Flo Shanks, 06/09/2012 11:29 AM ______________________________________________________________________   Discussed status with Dr. Naaman Plummer on 06/09/12 at 1140 and received telephone approval for admission today.  Admission Coordinator:  [place], [place], time1142/Date 06/09/12

## 2012-06-07 NOTE — Progress Notes (Signed)
Primary cardiologist: Dr. [name]  Subjective:   No chest pain or breathlessness. Strength improving;     Objective:   Temp:  [98.1 F (36.7 C)-98.4 F (36.9 C)] 98.1 F (36.7 C) (04/02 0526) Pulse Rate:  [66-71] 68 (04/02 0526) Resp:  [18] 18 (04/01 1344) BP: (103-124)/(46-58) 124/51 mmHg (04/02 0526) SpO2:  [97 %-98 %] 97 % (04/02 0526) Weight:  [157 lb (71.215 kg)] 157 lb (71.215 kg) (04/02 0526) Last BM Date: 06/06/12  Filed Weights   06/03/12 0546 06/06/12 0617 06/07/12 0526  Weight: 163 lb (73.936 kg) 156 lb 6.4 oz (70.943 kg) 157 lb (71.215 kg)    Intake/Output Summary (Last 24 hours) at 06/07/12 0716 Last data filed at 06/06/12 1700  Gross per 24 hour  Intake    240 ml  Output      0 ml  Net    240 ml   Telemetry: Sinus rhythm with occasional PVCs; SVT  Exam:  General: NAD.  Neck: supple  Lungs: Clear, nonlabored.  Cardiac: RRR, no gallop.  Abd: soft  Extremities: No edema  Lab Results:  Basic Metabolic Panel:  Recent Labs Lab 06/01/12 0534  06/05/12 0501 06/06/12 0532 06/07/12 0535  NA 144  < > 137 139 138  K 4.2  < > 3.6 3.9 3.9  CL 95*  < > 95* 98 98  CO2 44*  < > 33* 32 32  GLUCOSE 141*  < > 127* 105* 135*  BUN 37*  < > 33* 26* 28*  CREATININE 1.12*  < > 0.95 0.97 0.91  CALCIUM 8.9  < > 8.5 8.6 8.9  MG 1.9  --   --   --   --   < > = values in this interval not displayed.   CBC:  Recent Labs Lab 06/05/12 0501 06/06/12 0532 06/07/12 0535  WBC 9.2 8.1 8.4  HGB 7.3* 10.0* 10.2*  HCT 24.2* 31.0* 31.8*  MCV 87.1 84.2 85.3  PLT 432* 394 406*    Coagulation:  Recent Labs Lab 06/05/12 0501 06/06/12 0800 06/07/12 0535  INR 3.80* 4.09* 2.96*    Medications:   Scheduled Medications: . amiodarone  200 mg Oral Daily  . antiseptic oral rinse  15 mL Mouth Rinse BID  . carvedilol  6.25 mg Oral BID WC  . feeding supplement  237 mL Oral BID BM  . feeding supplement  1 Container Oral BID BM  . insulin aspart  0-9  Units Subcutaneous TID WC  . insulin glargine  10 Units Subcutaneous Q24H  . losartan  25 mg Oral Daily  . pantoprazole  40 mg Oral Daily  . polyethylene glycol  17 g Oral Daily  . sodium chloride  3 mL Intravenous Q12H  . Warfarin - Pharmacist Dosing Inpatient   Does not apply q1800    PRN Medications: fentaNYL, sodium chloride   Assessment:   1. VF arrest - neuro status improved. ICD when INR improves  2. Prolonged QT, Tikosyn d/c'd   3. PAF - continue amiodarone. Maintaining NSR.   4. LV dysfunction, LVEF 30-40% - On coreg and cozaar.  5. CAD   6. 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 Anemia. Hgb improved with transfusion  8. Superficial venous thrombosis, LUE  9. Acute on chronic systolic CHF - improved   Plan/Discussion:    Continue to work on strength, needs further PT. INR remains mildly increased; plan recheck in AM and proceed with ICD  tomorrow if approximately 2.5. After ICD, transfer to rehab. Note coumadin on hold; resume at lower dose as INR improves, most likely tomorrow.   [name], M.D., F.A.C.C.

## 2012-06-07 NOTE — Progress Notes (Signed)
Noted patient to have ICD placed tomorrow. Continue to follow for CIR. Admission to CIR will be pending bed availability at that time. [id number]

## 2012-06-08 ENCOUNTER — Encounter (HOSPITAL_COMMUNITY)
Admission: EM | Disposition: A | Discharge status: Rehabilitation Facility | Payer: Self-pay | Source: Home / Self Care | Attending: Cardiology

## 2012-06-08 DIAGNOSIS — I428 Other cardiomyopathies: Secondary | ICD-10-CM

## 2012-06-08 HISTORY — PX: IMPLANTABLE CARDIOVERTER DEFIBRILLATOR IMPLANT: SHX5473

## 2012-06-08 HISTORY — PX: CARDIAC DEFIBRILLATOR PLACEMENT: SHX171

## 2012-06-08 LAB — CBC
HCT: 32.5 % — ABNORMAL LOW (ref 36.0–46.0)
Hemoglobin: 10.3 g/dL — ABNORMAL LOW (ref 12.0–15.0)
MCH: 27.5 pg (ref 26.0–34.0)
RBC: 3.74 MIL/uL — ABNORMAL LOW (ref 3.87–5.11)

## 2012-06-08 LAB — BASIC METABOLIC PANEL
CO2: 32 mEq/L (ref 19–32)
Chloride: 99 mEq/L (ref 96–112)
GFR calc non Af Amer: 57 mL/min — ABNORMAL LOW (ref >90)
Glucose, Bld: 143 mg/dL — ABNORMAL HIGH (ref 70–99)
Potassium: 4.2 mEq/L (ref 3.5–5.1)
Sodium: 139 mEq/L (ref 135–145)

## 2012-06-08 LAB — GLUCOSE, CAPILLARY
Glucose-Capillary: 115 mg/dL — ABNORMAL HIGH (ref 70–99)
Glucose-Capillary: 157 mg/dL — ABNORMAL HIGH (ref 70–99)

## 2012-06-08 LAB — PROTIME-INR
INR: 2.04 — ABNORMAL HIGH (ref 0.00–1.49)
Prothrombin Time: 22.2 seconds — ABNORMAL HIGH (ref 11.6–15.2)

## 2012-06-08 SURGERY — IMPLANTABLE CARDIOVERTER DEFIBRILLATOR IMPLANT
Anesthesia: LOCAL

## 2012-06-08 MED ORDER — WARFARIN - PHARMACIST DOSING INPATIENT: Freq: Every day | Status: DC

## 2012-06-08 MED ORDER — CEFAZOLIN SODIUM 1-5 GM-% IV SOLN
1.0000 g | Freq: Four times a day (QID) | INTRAVENOUS | Status: DC
  Filled 2012-06-08 (×3): qty 50

## 2012-06-08 MED ORDER — SODIUM CHLORIDE 0.9 % IV SOLN: 250.0000 mL | INTRAVENOUS | Status: DC | PRN

## 2012-06-08 MED ORDER — CEFAZOLIN SODIUM 1-5 GM-% IV SOLN
1.0000 g | Freq: Four times a day (QID) | INTRAVENOUS | Status: AC
  Administered 2012-06-08 – 2012-06-09 (×3): 1 g via INTRAVENOUS
  Filled 2012-06-08 (×3): qty 50

## 2012-06-08 MED ORDER — ONDANSETRON HCL 4 MG/2ML IJ SOLN: 4.0000 mg | Freq: Four times a day (QID) | INTRAMUSCULAR | Status: DC | PRN

## 2012-06-08 MED ORDER — SODIUM CHLORIDE 0.9 % IJ SOLN
3.0000 mL | INTRAMUSCULAR | Status: DC | PRN
  Administered 2012-06-09 (×2): 3 mL via INTRAVENOUS

## 2012-06-08 MED ORDER — MIDAZOLAM HCL 5 MG/5ML IJ SOLN
INTRAMUSCULAR | Status: AC
  Filled 2012-06-08: qty 5

## 2012-06-08 MED ORDER — LIDOCAINE HCL (PF) 1 % IJ SOLN
INTRAMUSCULAR | Status: AC
  Filled 2012-06-08: qty 60

## 2012-06-08 MED ORDER — WARFARIN SODIUM 3 MG PO TABS
3.0000 mg | ORAL_TABLET | Freq: Once | ORAL | Status: DC
  Administered 2012-06-08: 3 mg via ORAL
  Filled 2012-06-08: qty 1

## 2012-06-08 MED ORDER — FENTANYL CITRATE 0.05 MG/ML IJ SOLN
INTRAMUSCULAR | Status: AC
  Filled 2012-06-08: qty 2

## 2012-06-08 MED ORDER — SODIUM CHLORIDE 0.9 % IJ SOLN
3.0000 mL | Freq: Two times a day (BID) | INTRAMUSCULAR | Status: DC
  Administered 2012-06-08 – 2012-06-09 (×2): 3 mL via INTRAVENOUS

## 2012-06-08 MED ORDER — HEPARIN (PORCINE) IN NACL 2-0.9 UNIT/ML-% IJ SOLN
INTRAMUSCULAR | Status: AC
  Filled 2012-06-08: qty 500

## 2012-06-08 MED ORDER — HYDROCODONE-ACETAMINOPHEN 5-325 MG PO TABS: 1.0000 | ORAL_TABLET | ORAL | Status: DC | PRN

## 2012-06-08 MED ORDER — ACETAMINOPHEN 325 MG PO TABS: 325.0000 mg | ORAL_TABLET | ORAL | Status: DC | PRN

## 2012-06-08 NOTE — Progress Notes (Signed)
Speech Pathology Cancel Note  Orders for pt to have swallow evaluated following ICD implantation. Pt for procedure in late pm. SLP will f/u in am tomorrow.   Herbie [place], [place] CCC-SLP [phone]

## 2012-06-08 NOTE — Progress Notes (Signed)
CSW Delora Fuel is still following the progress of this pt. The plan is for the pt to still go to CIR when she is ready for d/c.  Danise Edge, [place] Social Worker [phone]

## 2012-06-08 NOTE — Op Note (Signed)
SURGEON:  Thompson Grayer, MD      PREPROCEDURE DIAGNOSES:   1. Nonischemic cardiomyopathy.   2. New York Heart Association class III, heart failure chronically.   3. Persistent atrial fibrillation  4. Bradycardia  5. S/p VF arrest     POSTPROCEDURE DIAGNOSES:   1. Nonischemic cardiomyopathy.   2. New York Heart Association class III, heart failure chronically.   3. Persistent atrial fibrillation  4. Bradycardia  5. S/p VF arrest     PROCEDURES:    1. ICD implantation.  3. Defibrillation threshold testing     INTRODUCTION: [name] is a 72 y.o. female with a nonischemic CM (EF 33%), NYHA Class III CHF, bradycardia and atrial fibrillation. She is s/p VF arrest and has been successfully resuscitated.  No reversible causes have been found and she has a persistently prolonged QT interval.   At this time, she meets criteria for secondary prevention of sudden death.  The patient has a narrow QRS and does not meet criteria for revascularization.  The patient has been treated with an optimal medical regimen but continues to have a depressed ejection fraction and NYHA Class III CHF symptoms.  The patient therefore  presents today for ICD implantation.      DESCRIPTION OF PROCEDURE:  Informed written consent was obtained and the patient was brought to the electrophysiology lab in the fasting state. The patient was adequately sedated with intravenous Versed, and fentanyl as outlined in the nursing report.  The patient's left chest was prepped and draped in the usual sterile fashion by the EP lab staff.  The skin overlying the left deltopectoral region was infiltrated with lidocaine for local analgesia.  A 5-cm incision was made over the left deltopectoral region.  A left subcutaneous defibrillator pocket was fashioned using a combination of sharp and blunt dissection.  Electrocautery was used to assure hemostasis.   RA/RV Lead Placement: The left axillary vein was cannulated with fluoroscopic  visualization.  No contrast was required for this endeavor.  Through the left axillary vein, a [employer] Medical Tendril STS, model 2088TC-52  (serial # [id number]) right atrial lead and a [employer] Medical [place], model 7122Q-65 (serial number [id number]) right ventricular defibrillator lead were advanced with fluoroscopic visualization into the right atrial appendage and right ventricular septal positions respectively.  Initial atrial lead P-waves measured 4.7 mV with an impedance of 510 ohms and a threshold of 1.1 volts at 0.5 milliseconds.  The right ventricular lead R-wave measured 20 mV with impedance of 828 ohms and a threshold of 0.5 volts at 0.5 milliseconds.   The leads were secured to the pectoralis  fascia using #2 silk suture over the suture sleeves.  The pocket then  irrigated with copious gentamicin solution.  The leads were then  connected to a [place] DR  model [phone] (serial  Number [id number]) ICD.  The defibrillator was placed into the  pocket.  The pocket was then closed in 2 layers with 2.0 Vicryl suture  for the subcutaneous and subcuticular layers.  Steri-Strips and a  sterile dressing were then applied.   DFT Testing: Defibrillation Threshold testing was then performed. Ventricular fibrillation was induced with a T shock.  Adequate sensing of ventricular  fibrillation was observed with minimal dropout with a programmed sensitivity of 1.28mV.  The patient was successfully defibrillated to sinus rhythm with a single 15 joules shock delivered from the device with an impedance of 56 ohms in a duration of 6  seconds.  The patient remained in sinus rhythm thereafter.  There were no early apparent complications.     CONCLUSIONS:   1. nonischemic cardiomyopathy with chronic New York Heart Association class III heart failure. Prior VF arrest with prolonged QT, atrial fibrillation  2. Successful ICD implantation.   3. DFT less than or equal to 15 joules.   4. No early  apparent complications.

## 2012-06-08 NOTE — Progress Notes (Signed)
Spoke to Dr. Rayann Heman, he stated to NSL patient's IV, to resume diet, and to resume patient's coumadin.

## 2012-06-08 NOTE — Progress Notes (Signed)
ICD Criteria  Current LVEF (within 6 months):33%  NYHA Functional Classification: Class III  Heart Failure History:  Yes, Duration of heart failure since onset is > 9 months  Non-Ischemic Dilated Cardiomyopathy History:  No.  Atrial Fibrillation/Atrial Flutter:  Yes, A-Fib/A-Flutter type: Paroxysmal.  Ventricular Tachycardia History:  Yes, Hemodynamic instability present, VT Type:  SVT - Polymorphic.  Cardiac Arrest History:  Yes, This was a Ventricular Tachycardia/Ventricular Fibrillation Arrest. It is unknown whether this was a bradycardia arrest.  History of Syndromes with Risk of Sudden Death:  No.  Previous ICD:  No.  Electrophysiology Study: No.  Anticoagulation Therapy:  Patient is on anticoagulation therapy, anticoagulation was NOT held prior to procedure.   Beta Blocker Therapy:  Yes.   Ace Inhibitor/ARB Therapy:  Yes.

## 2012-06-08 NOTE — Progress Notes (Addendum)
SUBJECTIVE: The patient is doing well today.  At this time, she denies chest pain, shortness of breath, or any new concerns.  She has been participating with PT and feels as if she is getting stronger. Plan for inpatient rehab following ICD implantation.  Pt had epicardial LV leads placed at time of CABG.  QRS is 142msec with normal PR interval.   . amiodarone  200 mg Oral Daily  . antiseptic oral rinse  15 mL Mouth Rinse BID  . carvedilol  6.25 mg Oral BID WC  .  ceFAZolin (ANCEF) IV  2 g Intravenous On Call  . feeding supplement  237 mL Oral BID BM  . feeding supplement  1 Container Oral BID BM  . gentamicin irrigation  80 mg Irrigation On Call  . insulin aspart  0-9 Units Subcutaneous TID WC  . insulin glargine  10 Units Subcutaneous Q24H  . losartan  25 mg Oral Daily  . pantoprazole  40 mg Oral Daily  . polyethylene glycol  17 g Oral Daily  . sodium chloride  3 mL Intravenous Q12H  . Warfarin - Pharmacist Dosing Inpatient   Does not apply q1800   . sodium chloride      OBJECTIVE: Physical Exam: Filed Vitals:   06/07/12 0526 06/07/12 1312 06/07/12 2135 06/08/12 0519  BP: 124/51 113/62 112/45 115/71  Pulse: 68 68 68 68  Temp: 98.1 F (36.7 C) 98.3 F (36.8 C) 98.4 F (36.9 C) 98.1 F (36.7 C)  TempSrc: Oral Oral Oral Oral  Resp:  19 18 18   Height:      Weight: 157 lb (71.215 kg)     SpO2: 97% 97% 97% 95%    Intake/Output Summary (Last 24 hours) at 06/08/12 0717 Last data filed at 06/07/12 1312  Gross per 24 hour  Intake    480 ml  Output      0 ml  Net    480 ml    Telemetry reveals sinus rhythm, brief run of afib  GEN- The patient is well appearing, alert and oriented x 3 today.   Head- normocephalic, atraumatic Eyes-  Sclera clear, conjunctiva pink Ears- hearing intact Oropharynx- clear Neck- supple,   Lungs- Clear to ausculation bilaterally, normal work of breathing Heart- Regular rate and rhythm, no murmurs, rubs or gallops, PMI not laterally  displaced GI- soft, NT, ND, + BS Extremities- no clubbing, cyanosis, or edema Skin- no rash or lesion Psych- euthymic mood, full affect Neuro- strength and sensation are intact  LABS: Basic Metabolic Panel:  Recent Labs  06/07/12 0535 06/08/12 0545  NA 138 139  K 3.9 4.2  CL 98 99  CO2 32 32  GLUCOSE 135* 143*  BUN 28* 30*  CREATININE 0.91 0.97  CALCIUM 8.9 9.0   CBC:  Recent Labs  06/07/12 0535 06/08/12 0545  WBC 8.4 9.7  HGB 10.2* 10.3*  HCT 31.8* 32.5*  MCV 85.3 86.9  PLT 406* 409*   INR: 2.04  RADIOLOGY: Dg Chest Port 1 View 06/02/2012  *RADIOLOGY REPORT*  Clinical Data: Chest pain, congestive heart failure.  PORTABLE CHEST - 1 VIEW  Comparison: May 31, 2012.  Findings: Sternotomy wires are noted.  Stable mild cardiomegaly. Pulmonary edema noted on prior exam appears to have improved. Basilar opacity is significantly improved compared to prior exam with minimal left pleural effusion.  IMPRESSION: Significantly improved left basilar opacity compared to prior exam with residual minimal left pleural effusion.  Improved bilateral lung opacities compared to prior exam.  Original Report Authenticated By: Marijo Conception.,  M.D.      ASSESSMENT AND PLAN:  Principal Problem:   Sudden cardiac arrest Active Problems:   Atrial fibrillation   Mitral valve disease   Type II or unspecified type diabetes mellitus without mention of complication, uncontrolled   GERD (gastroesophageal reflux disease)   Ischemic cardiomyopathy   Valvular heart disease-bicuspid aortic valve s/p Mechanical replacement//MV repair   Ventricular fibrillation   Acute respiratory failure   Warfarin-induced coagulopathy   Acute encephalopathy   Metabolic acidosis   Sustained ventricular fibrillation   Acute on chronic systolic heart failure   Atelectasis  1. VF arrest/ torsades Doing well s/p resuscitation with amazing return of cognative function.   She meets criteria for secondary  prevention of sudden death.   Risks, benefits, alternatives to ICD implantation were discussed in detail with the patient today. The patient  understands that the risks include but are not limited to bleeding, infection, pneumothorax, perforation, tamponade, vascular damage, renal failure, MI, stroke, death, inappropriate shocks, and lead dislodgement and wishes to proceed.  We will therefore schedule device implantation at the next available time.  2. Dysphagia previously noted Repeat swallow Eval  3. afib Continue amiodarone Goal INR 2-3

## 2012-06-08 NOTE — Progress Notes (Signed)
ANTICOAGULATION CONSULT NOTE - Initial (Restart) /Follow Up Consult   Pharmacy Consult for Coumadin Indication: AVR/MVR, afib, DVT  No Known Allergies  Patient Measurements: Height: 5\' 6"  (167.6 cm) Weight: 157 lb (71.215 kg) IBW/kg (Calculated) : 59.3  Vital Signs: Temp: 98.4 F (36.9 C) (04/03 1742) Temp src: Oral (04/03 1742) BP: 114/55 mmHg (04/03 1742) Pulse Rate: 64 (04/03 1742)  Labs:  Recent Labs  06/06/12 0532 06/06/12 0800 06/07/12 0535 06/08/12 0545  HGB 10.0*  --  10.2* 10.3*  HCT 31.0*  --  31.8* 32.5*  PLT 394  --  406* 409*  LABPROT  --  37.2* 29.3* 22.2*  INR  --  4.09* 2.96* 2.04*  CREATININE 0.97  --  0.91 0.97    Estimated Creatinine Clearance: 53.8 ml/min (by C-G formula based on Cr of 0.97).   Assessment:  Pharmacy consulted to restart Coumadin in this 72yo female s/p ICD implantation today.  Preprocedure diagnosis includes NICM,  CHF class III, PAF, bradycardia and s/p VF arrest, acute LUE DVT (superficial venous thrombosis) of the left cephalic and basilic veins.    PMH: anemia, afib, diverticulitis, valvular heart dz, CVA, OP, DM, HTN, GERD, CHF, ICM, CAD, OA  AC: Coumadin PTA for mechanical AVR and MV repair 08, PAF. Now with acute LUE DVT. Admit INR 5.05 on 2mg  MWF, 4mg  TTSS.  INR goal 2.5-3.5 per Dr. Rosezella [place] note 3/13.  Coumadin was discontinued on 3/31 in preparation for ICD.  Last coumadin dose was 3mg  on 06/04/12.  INR trending up 3.8 on 3/31,  4.09 on 4/1 after previous dosing of 3mg  alternating with 4mg . INR has now declined to 2.04 after no coumadin 3/31, 4/1 and 06/07/12.   Goal INR 2.5-3.5 H/H 10.3/32.5,  PLTC = 409K.  No bleeding noted.   Goal of Therapy:  INR 2.5-3.5 Monitor platelets by anticoagulation protocol: Yes   Plan:  Coumadin 3 mg tonight.  Daily INR   Nicole Cella, RPh Clinical Pharmacist Pager: [phone]  06/08/2012 6:06 PM

## 2012-06-09 ENCOUNTER — Inpatient Hospital Stay (HOSPITAL_COMMUNITY): Payer: Medicare Other

## 2012-06-09 ENCOUNTER — Encounter (HOSPITAL_COMMUNITY): Payer: Self-pay | Admitting: Neurology

## 2012-06-09 ENCOUNTER — Encounter (HOSPITAL_COMMUNITY): Payer: Self-pay | Admitting: Physician Assistant

## 2012-06-09 ENCOUNTER — Inpatient Hospital Stay (HOSPITAL_COMMUNITY)
Admission: RE | Admit: 2012-06-09 | Discharge: 2012-06-16 | DRG: 945 | Disposition: A | Discharge status: Home / Self Care | Payer: Medicare Other | Source: Intra-hospital | Attending: Physical Medicine & Rehabilitation | Admitting: Physical Medicine & Rehabilitation

## 2012-06-09 DIAGNOSIS — I469 Cardiac arrest, cause unspecified: Secondary | ICD-10-CM | POA: Diagnosis present

## 2012-06-09 DIAGNOSIS — Z87891 Personal history of nicotine dependence: Secondary | ICD-10-CM | POA: Diagnosis not present

## 2012-06-09 DIAGNOSIS — I2510 Atherosclerotic heart disease of native coronary artery without angina pectoris: Secondary | ICD-10-CM | POA: Diagnosis present

## 2012-06-09 DIAGNOSIS — M810 Age-related osteoporosis without current pathological fracture: Secondary | ICD-10-CM | POA: Diagnosis present

## 2012-06-09 DIAGNOSIS — Z95810 Presence of automatic (implantable) cardiac defibrillator: Secondary | ICD-10-CM | POA: Diagnosis not present

## 2012-06-09 DIAGNOSIS — E119 Type 2 diabetes mellitus without complications: Secondary | ICD-10-CM | POA: Diagnosis present

## 2012-06-09 DIAGNOSIS — D649 Anemia, unspecified: Secondary | ICD-10-CM | POA: Diagnosis not present

## 2012-06-09 DIAGNOSIS — Z7901 Long term (current) use of anticoagulants: Secondary | ICD-10-CM | POA: Diagnosis not present

## 2012-06-09 DIAGNOSIS — I5022 Chronic systolic (congestive) heart failure: Secondary | ICD-10-CM | POA: Diagnosis not present

## 2012-06-09 DIAGNOSIS — I4891 Unspecified atrial fibrillation: Secondary | ICD-10-CM | POA: Diagnosis present

## 2012-06-09 DIAGNOSIS — K5900 Constipation, unspecified: Secondary | ICD-10-CM | POA: Diagnosis not present

## 2012-06-09 DIAGNOSIS — K219 Gastro-esophageal reflux disease without esophagitis: Secondary | ICD-10-CM | POA: Diagnosis present

## 2012-06-09 DIAGNOSIS — I509 Heart failure, unspecified: Secondary | ICD-10-CM | POA: Diagnosis not present

## 2012-06-09 DIAGNOSIS — I2589 Other forms of chronic ischemic heart disease: Secondary | ICD-10-CM | POA: Diagnosis not present

## 2012-06-09 DIAGNOSIS — R5381 Other malaise: Secondary | ICD-10-CM | POA: Diagnosis present

## 2012-06-09 DIAGNOSIS — Z951 Presence of aortocoronary bypass graft: Secondary | ICD-10-CM | POA: Diagnosis not present

## 2012-06-09 DIAGNOSIS — I498 Other specified cardiac arrhythmias: Secondary | ICD-10-CM | POA: Diagnosis not present

## 2012-06-09 DIAGNOSIS — Z5189 Encounter for other specified aftercare: Principal | ICD-10-CM

## 2012-06-09 DIAGNOSIS — I080 Rheumatic disorders of both mitral and aortic valves: Secondary | ICD-10-CM | POA: Diagnosis present

## 2012-06-09 LAB — BASIC METABOLIC PANEL
Calcium: 8.6 mg/dL (ref 8.4–10.5)
Creatinine, Ser: 0.99 mg/dL (ref 0.50–1.10)
GFR calc Af Amer: 65 mL/min — ABNORMAL LOW (ref >90)

## 2012-06-09 LAB — CBC
MCH: 27.1 pg (ref 26.0–34.0)
MCHC: 30.8 g/dL (ref 30.0–36.0)
Platelets: 368 10*3/uL (ref 150–400)

## 2012-06-09 LAB — GLUCOSE, CAPILLARY: Glucose-Capillary: 206 mg/dL — ABNORMAL HIGH (ref 70–99)

## 2012-06-09 LAB — PROTIME-INR: Prothrombin Time: 21.5 seconds — ABNORMAL HIGH (ref 11.6–15.2)

## 2012-06-09 MED ORDER — CARVEDILOL 6.25 MG PO TABS
6.2500 mg | ORAL_TABLET | Freq: Two times a day (BID) | ORAL | Status: DC
  Administered 2012-06-10 – 2012-06-16 (×13): 6.25 mg via ORAL
  Filled 2012-06-09 (×15): qty 1

## 2012-06-09 MED ORDER — ENSURE COMPLETE PO LIQD
237.0000 mL | Freq: Two times a day (BID) | ORAL | Status: DC
  Administered 2012-06-10 – 2012-06-15 (×8): 237 mL via ORAL

## 2012-06-09 MED ORDER — ONDANSETRON HCL 4 MG/2ML IJ SOLN: 4.0000 mg | Freq: Four times a day (QID) | INTRAMUSCULAR | Status: DC | PRN

## 2012-06-09 MED ORDER — WARFARIN SODIUM 3 MG PO TABS: 3.0000 mg | ORAL_TABLET | Freq: Once | ORAL | Status: DC

## 2012-06-09 MED ORDER — POLYETHYLENE GLYCOL 3350 17 G PO PACK
17.0000 g | PACK | Freq: Every day | ORAL | Status: DC
  Filled 2012-06-09 (×8): qty 1

## 2012-06-09 MED ORDER — HYDROCODONE-ACETAMINOPHEN 5-325 MG PO TABS: 1.0000 | ORAL_TABLET | ORAL | Status: DC | PRN

## 2012-06-09 MED ORDER — AMIODARONE HCL 200 MG PO TABS
200.0000 mg | ORAL_TABLET | Freq: Every day | ORAL | Status: DC
  Administered 2012-06-10 – 2012-06-16 (×7): 200 mg via ORAL
  Filled 2012-06-09 (×8): qty 1

## 2012-06-09 MED ORDER — LOSARTAN POTASSIUM 25 MG PO TABS
25.0000 mg | ORAL_TABLET | Freq: Every day | ORAL | Status: DC
  Administered 2012-06-10 – 2012-06-16 (×7): 25 mg via ORAL
  Filled 2012-06-09 (×8): qty 1

## 2012-06-09 MED ORDER — SORBITOL 70 % SOLN: 30.0000 mL | Freq: Every day | Status: DC | PRN

## 2012-06-09 MED ORDER — WARFARIN SODIUM 3 MG PO TABS
3.0000 mg | ORAL_TABLET | Freq: Once | ORAL | Status: DC
  Administered 2012-06-09: 3 mg via ORAL
  Filled 2012-06-09: qty 1

## 2012-06-09 MED ORDER — PANTOPRAZOLE SODIUM 40 MG PO TBEC
40.0000 mg | DELAYED_RELEASE_TABLET | Freq: Every day | ORAL | Status: DC
  Administered 2012-06-10 – 2012-06-16 (×7): 40 mg via ORAL
  Filled 2012-06-09 (×7): qty 1

## 2012-06-09 MED ORDER — INSULIN GLARGINE 100 UNIT/ML ~~LOC~~ SOLN
10.0000 [IU] | SUBCUTANEOUS | Status: DC
  Administered 2012-06-10 – 2012-06-16 (×7): 10 [IU] via SUBCUTANEOUS
  Filled 2012-06-09 (×8): qty 0.1

## 2012-06-09 MED ORDER — ONDANSETRON HCL 4 MG PO TABS: 4.0000 mg | ORAL_TABLET | Freq: Four times a day (QID) | ORAL | Status: DC | PRN

## 2012-06-09 MED ORDER — WARFARIN SODIUM 3 MG PO TABS
3.0000 mg | ORAL_TABLET | Freq: Once | ORAL | Status: DC
  Filled 2012-06-09: qty 1

## 2012-06-09 MED ORDER — ACETAMINOPHEN 325 MG PO TABS: 325.0000 mg | ORAL_TABLET | ORAL | Status: DC | PRN

## 2012-06-09 MED ORDER — WARFARIN - PHARMACIST DOSING INPATIENT: Freq: Every day | Status: DC

## 2012-06-09 MED ORDER — INSULIN ASPART 100 UNIT/ML ~~LOC~~ SOLN
0.0000 [IU] | Freq: Three times a day (TID) | SUBCUTANEOUS | Status: DC
  Administered 2012-06-10: 2 [IU] via SUBCUTANEOUS
  Administered 2012-06-10: 1 [IU] via SUBCUTANEOUS
  Administered 2012-06-11: 3 [IU] via SUBCUTANEOUS
  Administered 2012-06-11 – 2012-06-13 (×4): 1 [IU] via SUBCUTANEOUS

## 2012-06-09 NOTE — Interval H&P Note (Signed)
[name] was admitted today to Inpatient Rehabilitation with the diagnosis of cardiac arrest with deconditioning and mild anoxic BI.  The patient's history has been reviewed, patient examined, and there is no change in status.  Patient continues to be appropriate for intensive inpatient rehabilitation.  I have reviewed the patient's chart and labs.  Questions were answered to the patient's satisfaction.  SWARTZ,ZACHARY T 06/09/2012, 10:36 PM

## 2012-06-09 NOTE — Evaluation (Signed)
Clinical/Bedside Swallow Evaluation Patient Details  Name: [name] MRN: [id number] Date of Birth: Apr 12, 1940  Today's Date: 06/09/2012 Time: 0800-0815 SLP Time Calculation (min): 15 min  Past Medical History:  Past Medical History  Diagnosis Date  . Anemia   . Atrial fibrillation     A.fib/flutter: s/p MAZE 2008, DCCV 2011, on coumadin; previously on flecainide, but dc'd due to worsening EF  . Diverticulitis   . Valvular heart disease     bicuspid AV/severe AS, mitral regurgitation, & aneurysm of the ascending thoracic aorta (s/p AV replacement, MV repair, resection and grafting of the ascending thoracic aortic aneurysm 2008  . CVA (cerebral vascular accident)     2008 - felt due to oscillating calcium on aortic valve  . Carcinoma in situ of vulva   . Fibroid   . Ovarian cyst, left 2008-2009  . Osteoporosis   . Diabetes mellitus   . Hypertension   . GERD (gastroesophageal reflux disease)   . Chronic systolic CHF (congestive heart failure)     EF 35%  . Ischemic cardiomyopathy     EF 35%  . Coronary artery disease     Occluded LM 2/2 previous aortic root surgery; s/p 2v CABG 2010 LIMA to LAD, SVG to OM; Cath 03/11/12 patent SVG to OM, atretic LIMA to LAD, patent RCA, occluded native LM, EF 35%   . Shortness of breath   . Arthritis     OSTEO  . Fracture of lumbar spine    Past Surgical History:  Past Surgical History  Procedure Laterality Date  . Foot surgery      removed neuroma  . Aortic root replacement  2008    homograft  . Mv repair  2008    due to severe MR  . Resection and grafting of ascending thoracic aortic    . Aneurysm and left sided maze  2008  . Coronary artery bypass graft  4/10    LIMA to LAD, SVG to OM  . Wide excision of vulva      CA INSITU   HPI:  72 yr old with sudden cardiac arrest, team estimates 45 minutes of ACLS to ROSC, intubated 3/12-3/18.  PMH:  ICM, AF, severe AS and MR, CVA, DM, HTN, GERD.  CXR 3/17 pulmonary vascular congestion and  pleura effusion.  FEES on 3/21 showed flash penetration of thin liquids, poor attention requiring soft diet. SLP orders were discontinued by provider at that point and pt was not seen again. New orders for swallow eval for diet upgrade given pts progress.    Assessment / Plan / Recommendation Clinical Impression  Pt presents with mild evidence of esophageal dysphagia consistent with pts history of GERD. Pt with delayed throat clearing particularly after consuming solids. Pt is able to masticate regular solids and risk of aspiration is low. Pt may intitiate a regular diet and thin liquids with esophageal precautions; SLP provided education to pt/family. No SLP f/u for swallowing needed. Pt to CIR, would likely still benefit from cognitive therapy based on previous assessment.     Aspiration Risk  Mild    Diet Recommendation Regular;Thin liquid   Liquid Administration via: Cup;Straw Medication Administration: Whole meds with liquid Supervision: Patient able to self feed Compensations: Small sips/bites;Slow rate;Follow solids with liquid Postural Changes and/or Swallow Maneuvers: Seated upright 90 degrees;Upright 30-60 min after meal;Out of bed for meals    Other  Recommendations Oral Care Recommendations: Patient independent with oral care   Follow Up Recommendations  Inpatient  Rehab    Frequency and Duration        Pertinent Vitals/Pain NA    SLP Swallow Goals     Swallow Study Prior Functional Status       General HPI: 72 yr old with sudden cardiac arrest, team estimates 45 minutes of ACLS to ROSC, intubated 3/12-3/18.  PMH:  ICM, AF, severe AS and MR, CVA, DM, HTN, GERD.  CXR 3/17 pulmonary vascular congestion and pleura effusion.  FEES on 3/21 showed flash penetration of thin liquids, poor attention requiring soft diet. SLP orders were discontinued by provider at that point and pt was not seen again. New orders for swallow eval for diet upgrade given pts progress.  Type of  Study: Bedside swallow evaluation Previous Swallow Assessment: BSE, FEES 3/21 - D2/thin Diet Prior to this Study: Dysphagia 2 (chopped);Thin liquids Temperature Spikes Noted: No Respiratory Status: Supplemental O2 delivered via (comment) History of Recent Intubation: Yes Length of Intubations (days): 7 days Date extubated: 05/23/12 Behavior/Cognition: Alert;Cooperative;Pleasant mood Oral Cavity - Dentition: Adequate natural dentition Self-Feeding Abilities: Able to feed self Patient Positioning: Upright in bed Baseline Vocal Quality: Clear Volitional Cough: Strong Volitional Swallow: Able to elicit    Oral/Motor/Sensory Function Overall Oral Motor/Sensory Function: Appears within functional limits for tasks assessed   Ice Chips     Thin Liquid Thin Liquid: Impaired Presentation: Cup;Straw Pharyngeal  Phase Impairments: Throat Clearing - Delayed    Nectar Thick Nectar Thick Liquid: Not tested   Honey Thick Honey Thick Liquid: Not tested   Puree Puree: Within functional limits   Solid   GO    Solid: Impaired Presentation: Self Fed Pharyngeal Phase Impairments: Throat Clearing - Delayed      Herbie [place], MA CCC-SLP [phone]  [name] 06/09/2012,8:44 AM

## 2012-06-09 NOTE — H&P (Signed)
Physical Medicine and Rehabilitation Admission H&P    No chief complaint on file. : HPI: ALMARIE GLAZER is a 72 y.o. right-handed female with history of atrial fibrillation on chronic Coumadin therapy, CVA 2008, severe aortic stenosis and mitral regurgitation, chronic systolic congestive heart failure and ischemic cardiomyopathy. Admitted 05/18/2012 after becoming unresponsive while riding in the car. On arrival to the ED was found to be in VF arrest. Team estimated 45 minutes of ACLS.  INR upon admission of greater than 5.00 and did receive fresh frozen plasma. Cranial CT scan showed no acute abnormalities. Question aspiration pneumonia and placed on Unasyn. Cardiology services consulted maintained on Levophed for pressure support. Troponin less than 0.30. EKG demonstrating wide QRS with prolonged QT. Patient maintained on ventilatory support. Upper extremity Dopplers 05/22/2012 did show findings consistent with superficial vein thrombosis left cephalic vein left basilic vein. Patient currently maintained on intravenous heparin as well as Coumadin therapy resumed. She remained intubated from 05/18/2012 at 05/23/2012. Persistent atrial fibrillation as well as bradycardia and underwent placement of ICD 06/08/2012 per Dr. Rayann Heman. Patient with chronic anemia 7.4 to 7.9 and monitored. Her diet was slowly advanced to a regular consistency. Physical therapy evaluation completed 05/24/2012 and ongoing with recommendations of physical medicine rehabilitation consult to consider inpatient rehabilitation services. Patient was admitted for comprehensive rehabilitation program  Review of Systems  Cardiovascular: Positive for palpitations and leg swelling.  Anginal pain  Gastrointestinal:  Reflux  Musculoskeletal: Positive for myalgias.  Neurological: Positive for weakness.  All other systems reviewed and are negative   Past Medical History  Diagnosis Date  . Anemia   . Atrial fibrillation     A.fib/flutter:  s/p MAZE 2008, DCCV 2011, on coumadin; previously on flecainide, but dc'd due to worsening EF  . Diverticulitis   . Valvular heart disease     bicuspid AV/severe AS, mitral regurgitation, & aneurysm of the ascending thoracic aorta (s/p AV replacement, MV repair, resection and grafting of the ascending thoracic aortic aneurysm 2008  . CVA (cerebral vascular accident)     2008 - felt due to oscillating calcium on aortic valve  . Carcinoma in situ of vulva   . Fibroid   . Ovarian cyst, left 2008-2009  . Osteoporosis   . Diabetes mellitus   . Hypertension   . GERD (gastroesophageal reflux disease)   . Chronic systolic CHF (congestive heart failure)     EF 35%  . Ischemic cardiomyopathy     EF 35%  . Coronary artery disease     Occluded LM 2/2 previous aortic root surgery; s/p 2v CABG 2010 LIMA to LAD, SVG to OM; Cath 03/11/12 patent SVG to OM, atretic LIMA to LAD, patent RCA, occluded native LM, EF 35%   . Shortness of breath   . Arthritis     OSTEO  . Fracture of lumbar spine    Past Surgical History  Procedure Laterality Date  . Foot surgery      removed neuroma  . Aortic root replacement  2008    homograft  . Mv repair  2008    due to severe MR  . Resection and grafting of ascending thoracic aortic    . Aneurysm and left sided maze  2008  . Coronary artery bypass graft  4/10    LIMA to LAD, SVG to OM  . Wide excision of vulva      CA INSITU   Family History  Problem Relation Age of Onset  . Hypertension Mother   .  Breast cancer Maternal Aunt     age 30's  . Cancer Paternal Grandmother     COLON   Social History:  reports that she quit smoking about 29 years ago. She has never used smokeless tobacco. She reports that she does not drink alcohol or use illicit drugs. Allergies: No Known Allergies Medications Prior to Admission  Medication Sig Dispense Refill  . Cholecalciferol (VITAMIN D3) 50000 UNITS CAPS Take 1 tablet by mouth once a week.  12 capsule  3  .  Cyanocobalamin (VITAMIN B-12) 2500 MCG SUBL Place 1 tablet under the tongue 3 (three) times a week. Dissolves 1 tablet under the tongue on Monday, Wednesday, and Friday      . dofetilide (TIKOSYN) 125 MCG capsule Take 3 capsules (375 mcg total) by mouth every 12 (twelve) hours.  180 capsule  6  . furosemide (LASIX) 20 MG tablet Take 20 mg by mouth every morning.      . magnesium oxide (MAG-OX) 400 MG tablet Take 800 mg by mouth 3 (three) times daily.      . metFORMIN (GLUCOPHAGE-XR) 500 MG 24 hr tablet Take 500-1,500 mg by mouth 2 (two) times daily with a meal. Takes 1 tablet in am and 3 tablets in pm      . metoprolol succinate (TOPROL-XL) 100 MG 24 hr tablet Take 100 mg by mouth every morning.       . metoprolol succinate (TOPROL-XL) 50 MG 24 hr tablet Take 50 mg by mouth at bedtime. Take with or immediately following a meal.      . MICARDIS 20 MG tablet TAKE 1/2 TABLET BY MOUTH EVERY DAY  30 tablet  1  . omeprazole (PRILOSEC OTC) 20 MG tablet Take 20 mg by mouth daily after breakfast.       . pioglitazone (ACTOS) 45 MG tablet Take 45 mg by mouth every morning.      Marland Kitchen spironolactone (ALDACTONE) 25 MG tablet Take 12.5 mg by mouth daily.      Marland Kitchen telmisartan (MICARDIS) 20 MG tablet Take 10 mg by mouth every morning.       . warfarin (COUMADIN) 2 MG tablet Take 2 mg by mouth 3 (three) times a week. Monday, Wednesday and Friday      . warfarin (COUMADIN) 4 MG tablet Take 4 mg by mouth 4 (four) times a week. Tuesday, Thursday, Saturday and Sunday        Home: Home Living Lives With: Spouse Available Help at Discharge: Family;Available 24 hours/day Type of Home: House Home Access: Stairs to enter Home Layout: One level Home Adaptive Equipment: None   Functional History: Prior Function Able to Take Stairs?: Yes Driving: Yes Vocation: Retired Comments: did get out of breath with long distances of walking  Functional Status:  Mobility: Bed Mobility Bed Mobility: Scooting to HOB Supine to  Sit: 5: Supervision;HOB elevated;With rails Sitting - Scoot to Edge of Bed: 5: Supervision;With rail Scooting to [place]: 1: +2 Total assist Scooting to [facility]: Patient Percentage: 20% Transfers Transfers: Sit to Stand;Stand to Sit Sit to Stand: 4: Min assist;With upper extremity assist;From chair/3-in-1;From bed;With armrests Sit to Stand: Patient Percentage: 60% Stand to Sit: 4: Min assist;To chair/3-in-1;To bed Stand to Sit: Patient Percentage: 60% Stand Pivot Transfers: 4: Min assist Ambulation/Gait Ambulation/Gait Assistance: 4: Min assist Ambulation/Gait: Patient Percentage: 60% Ambulation Distance (Feet): 120 Feet Assistive device: Rolling walker Ambulation/Gait Assistance Details: stayed in room at pt's request; multiple trips to and from door with turns and assist needed for  safety with walker management around obstacles and with turns. Gait Pattern: Step-through pattern;Trunk flexed;Shuffle Gait velocity: decreased General Gait Details: VCs to correct flexed posture, HHA of 2 for balance Stairs: No Wheelchair Mobility Wheelchair Mobility: No  ADL: ADL Grooming: Dance movement psychotherapist;Teeth care;Moderate assistance (terminates task prematurely) Where Assessed - Grooming: Unsupported standing (leaning against sink level for support) Upper Body Bathing: Simulated;Moderate assistance Where Assessed - Upper Body Bathing: Supported sitting Lower Body Bathing: Moderate assistance Where Assessed - Lower Body Bathing: Supported sit to stand Upper Body Dressing: Simulated;Moderate assistance Where Assessed - Upper Body Dressing: Supported sitting Lower Body Dressing: Performed;Maximal assistance Where Assessed - Lower Body Dressing: Supported sitting Toilet Transfer: Minimal assistance Toilet Transfer Method: Sit to stand Toilet Transfer Equipment: Raised toilet seat with arms (or 3-in-1 over toilet) Equipment Used: Rolling walker Transfers/Ambulation Related to ADLs: Pt able to walk to sink  from recliner with Min (A) and when stopping has a posterior lean that requires MIn (A) to correct. Pt with hgb 7.3 with pending transfusion but very willing to participate. ADL Comments: Pt requesting 3n1 upon standing. pt without void however smearing present . Pt attempting to wipe and needs assistance due to smearing and not fully cleaning the buttock area. Pt ambulated to sink with heavy lean on surface for stability. Pt terminating task after 3 brushes requesting 3n1 again. Pt needed extended time to sequence oral care and hand washing. Pt with small stool second attempt. Barrier cream applied to buttock. Pt able to initiate and maintain static squat for hygiene for 30seconds x2 .   Cognition: Cognition Overall Cognitive Status: Impaired Arousal/Alertness: Awake/alert Orientation Level: Oriented X4 Attention: Sustained Sustained Attention: Impaired Sustained Attention Impairment: Verbal basic;Functional basic Memory: Impaired Memory Impairment: Storage deficit;Retrieval deficit;Decreased recall of new information;Decreased short term memory;Prospective memory Decreased Short Term Memory: Verbal basic;Functional basic Awareness: Impaired Awareness Impairment: Intellectual impairment;Emergent impairment;Anticipatory impairment Problem Solving: Impaired Problem Solving Impairment: Verbal basic;Functional basic Executive Function: Self Correcting;Self Monitoring;Initiating;Organizing;Decision Making Organizing: Impaired Safety/Judgment: Impaired Cognition Overall Cognitive Status: Appears within functional limits for tasks assessed/performed Area of Impairment: Attention;Memory;Awareness of errors;Awareness of deficits;Problem solving;Executive functioning Arousal/Alertness: Awake/alert Orientation Level: Appears intact for tasks assessed Behavior During Session: [facility] for tasks performed Current Attention Level: Alternating Attention - Other Comments: Pt very distractable.  Once  distracted from the task pt needed max cues and sometimes physical cues to resume the task at hand. Memory: Decreased recall of precautions Memory Deficits: Pt unable to recall where she lives in [place] but did recall what she had for breakfast, husbands name etc. Following Commands: Follows one step commands with increased time Safety/Judgement: Decreased awareness of need for assistance Awareness of Errors: Assistance required to identify errors made;Assistance required to correct errors made Awareness of Errors - Other Comments: Pt unaware when she is leaning over but with cues was able to correct.  Awareness of Deficits:   Problem Solving: Increased time and step by step cueing for transfers.   Executive Functioning: severely impaired. Cognition - Other Comments: slow processing, - made errors but needed extended time to recognize then correct  Physical Exam: Blood pressure 117/47, pulse 75, temperature 98.2 F (36.8 C), temperature source Oral, resp. rate 20, height 5\' 6"  (1.676 m), weight 72.485 kg (159 lb 12.8 oz), SpO2 95.00%. Physical Exam  Vitals reviewed.  Constitutional:  72 year old white female in no acute distress HENT: oral mucosa pink and moist. Head: Normocephalic.  Eyes: EOM are normal.  Neck: Neck supple. No thyromegaly present. No JVD or LAD  Cardiovascular:  Cardiac rate control. Without murmur rubs or gallops Pulmonary/Chest:  Decreased breath sounds at the bases. No distress, no rales Abdominal: Bowel sounds are normal. She exhibits no distension. No pain Musculoskeletal: no overt tenderness. Generally normal ROM   Neurological: She is alert.   she is oriented x3 to person, place and date of birth. She was able to name the hospital. Followed simple commands. A little slow to process but generally appropriate. Fair insight and awareness.  Strength nearly symmetrical and grossly 3+ to 4/5 prox to 4+/5 distally in UE. In LE she is 3 at HF, 3+ KE, 4/5 at ankles .  No sensory abnl. dtr's are 2+ Skin: Skin is warm and dry. ICD site clean,dressed and intact left chest. Psychiatric: She has a flat mood and affect. Her behavior is normal   Results for orders placed during the hospital encounter of 05/18/12 (from the past 48 hour(s))  GLUCOSE, CAPILLARY     Status: Abnormal   Collection Time    06/07/12 11:39 AM      Result Value Range   Glucose-Capillary 229 (*) 70 - 99 mg/dL   Comment 1 Notify RN    GLUCOSE, CAPILLARY     Status: Abnormal   Collection Time    06/07/12  5:03 PM      Result Value Range   Glucose-Capillary 161 (*) 70 - 99 mg/dL   Comment 1 Notify RN    GLUCOSE, CAPILLARY     Status: Abnormal   Collection Time    06/07/12  9:25 PM      Result Value Range   Glucose-Capillary 184 (*) 70 - 99 mg/dL   Comment 1 Notify RN    BASIC METABOLIC PANEL     Status: Abnormal   Collection Time    06/08/12  5:45 AM      Result Value Range   Sodium 139  135 - 145 mEq/L   Potassium 4.2  3.5 - 5.1 mEq/L   Chloride 99  96 - 112 mEq/L   CO2 32  19 - 32 mEq/L   Glucose, Bld 143 (*) 70 - 99 mg/dL   BUN 30 (*) 6 - 23 mg/dL   Creatinine, Ser 0.97  0.50 - 1.10 mg/dL   Calcium 9.0  8.4 - 10.5 mg/dL   GFR calc non Af Amer 57 (*) >90 mL/min   GFR calc Af Amer 67 (*) >90 mL/min   Comment:            The eGFR has been calculated     using the CKD EPI equation.     This calculation has not been     validated in all clinical     situations.     eGFR's persistently     <90 mL/min signify     possible Chronic Kidney Disease.  CBC     Status: Abnormal   Collection Time    06/08/12  5:45 AM      Result Value Range   WBC 9.7  4.0 - 10.5 K/uL   RBC 3.74 (*) 3.87 - 5.11 MIL/uL   Hemoglobin 10.3 (*) 12.0 - 15.0 g/dL   HCT 32.5 (*) 36.0 - 46.0 %   MCV 86.9  78.0 - 100.0 fL   MCH 27.5  26.0 - 34.0 pg   MCHC 31.7  30.0 - 36.0 g/dL   RDW 16.2 (*) 11.5 - 15.5 %   Platelets 409 (*) 150 - 400 K/uL  PROTIME-INR  Status: Abnormal   Collection Time     06/08/12  5:45 AM      Result Value Range   Prothrombin Time 22.2 (*) 11.6 - 15.2 seconds   INR 2.04 (*) 0.00 - 1.49  GLUCOSE, CAPILLARY     Status: Abnormal   Collection Time    06/08/12  8:06 AM      Result Value Range   Glucose-Capillary 143 (*) 70 - 99 mg/dL   Comment 1 Documented in Chart     Comment 2 Notify RN    GLUCOSE, CAPILLARY     Status: Abnormal   Collection Time    06/08/12 12:04 PM      Result Value Range   Glucose-Capillary 125 (*) 70 - 99 mg/dL   Comment 1 Documented in Chart     Comment 2 Notify RN    GLUCOSE, CAPILLARY     Status: Abnormal   Collection Time    06/08/12  5:30 PM      Result Value Range   Glucose-Capillary 115 (*) 70 - 99 mg/dL   Comment 1 Documented in Chart     Comment 2 Notify RN    GLUCOSE, CAPILLARY     Status: Abnormal   Collection Time    06/08/12  9:06 PM      Result Value Range   Glucose-Capillary 157 (*) 70 - 99 mg/dL  BASIC METABOLIC PANEL     Status: Abnormal   Collection Time    06/09/12  5:38 AM      Result Value Range   Sodium 138  135 - 145 mEq/L   Potassium 4.0  3.5 - 5.1 mEq/L   Chloride 100  96 - 112 mEq/L   CO2 30  19 - 32 mEq/L   Glucose, Bld 124 (*) 70 - 99 mg/dL   BUN 27 (*) 6 - 23 mg/dL   Creatinine, Ser 0.99  0.50 - 1.10 mg/dL   Calcium 8.6  8.4 - 10.5 mg/dL   GFR calc non Af Amer 56 (*) >90 mL/min   GFR calc Af Amer 65 (*) >90 mL/min   Comment:            The eGFR has been calculated     using the CKD EPI equation.     This calculation has not been     validated in all clinical     situations.     eGFR's persistently     <90 mL/min signify     possible Chronic Kidney Disease.  PROTIME-INR     Status: Abnormal   Collection Time    06/09/12  5:38 AM      Result Value Range   Prothrombin Time 21.5 (*) 11.6 - 15.2 seconds   INR 1.95 (*) 0.00 - 1.49  CBC     Status: Abnormal   Collection Time    06/09/12  5:38 AM      Result Value Range   WBC 9.8  4.0 - 10.5 K/uL   RBC 3.61 (*) 3.87 - 5.11 MIL/uL    Hemoglobin 9.8 (*) 12.0 - 15.0 g/dL   HCT 31.8 (*) 36.0 - 46.0 %   MCV 88.1  78.0 - 100.0 fL   MCH 27.1  26.0 - 34.0 pg   MCHC 30.8  30.0 - 36.0 g/dL   RDW 16.3 (*) 11.5 - 15.5 %   Platelets 368  150 - 400 K/uL  GLUCOSE, CAPILLARY     Status: Abnormal   Collection Time  06/09/12  7:59 AM      Result Value Range   Glucose-Capillary 125 (*) 70 - 99 mg/dL   Comment 1 Notify RN     No results found.  Post Admission Physician Evaluation: 1. 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 2. Patient is admitted to receive collaborative, interdisciplinary care between the physiatrist, rehab nursing staff, and therapy team. 3. Patient's level of medical complexity and substantial therapy needs in context of that medical necessity cannot be provided at a lesser intensity of care such as a SNF. 4. Patient has experienced substantial functional loss from his/her baseline which was documented above under the "Functional History" and "Functional Status" headings.  Judging by the patient's diagnosis, physical exam, and functional history, the patient has potential for functional progress which will result in measurable gains while on inpatient rehab.  These gains will be of substantial and practical use upon discharge  in facilitating mobility and self-care at the household level. 5. Physiatrist will provide 24 hour management of medical needs as well as oversight of the therapy plan/treatment and provide guidance as appropriate regarding the interaction of the two. 6. 24 hour rehab nursing will assist with bladder management, bowel management, safety, skin/wound care, disease management, medication administration, pain management and patient education  and help integrate therapy concepts, techniques,education, etc. 7. PT will assess and treat for/with: Lower extremity strength, range of motion, stamina, balance, functional  mobility, safety, adaptive techniques and equipment, NMR, cognitive perceptual training, education.   Goals are: supervision to mod I. 8. OT will assess and treat for/with: Lower extremity strength, range of motion, stamina, balance, functional mobility, safety, adaptive techniques and equipment, NMR, cognitive perceptual training, educatrion.   Goals are: mod I to supervision. 9. SLP will assess and treat for/with: cognition.  Goals are: mod I to supervision. 10. Case Management and Social Worker will assess and treat for psychological issues and discharge planning. 11. Team conference will be held weekly to assess progress toward goals and to determine barriers to discharge. 12. Patient will receive at least 3 hours of therapy per day at least 5 days per week. 13. ELOS: 10 days      Prognosis:  excellent   Medical Problem List and Plan: 1. deconditioning after cardiac arrest status post ICD placement, respiratory failure, multi-medical issues 2. DVT Prophylaxis/Anticoagulation: Chronic Coumadin therapy. INR 1.95 06/09/2012 3. Pain Management: tylenol,hydrocodone as needed.   -pain under fair control at present. 4. Neuropsych: This patient is capable of making decisions on her own behalf. 5. Atrial fibrillation/bradycardia. Status post ICD placement 06/09/2012. Amiodarone 200 mg daily, coreg 6.25 mg twice a day, Cozaar 25 mg daily. Monitor rates with increased physical activity.  6. Diabetes mellitus with peripheral neuropathy. Latest hemoglobin A1c 6.2. Lantus insulin 10 units daily. Check blood sugars a.c. and at bedtime. Patient on Glucophage 500 mg twice a day prior to admission  -adjust regimen as needed. 7. Chronic anemia 7.9 and monitored. 8. Constipation: received enema today. Continue work on evacuation and then regular maintenance regimen 9. GERD: protonix  Meredith Staggers, MD, Mellody Drown     06/09/2012

## 2012-06-09 NOTE — Progress Notes (Signed)
Admitting patient to inpatient rehab today. D.Dunn,PA reports pt is stable for d/c to CIR. Pt is eager to come to rehab. [phone] for questions.

## 2012-06-09 NOTE — Progress Notes (Signed)
ANTICOAGULATION CONSULT NOTE - Follow Up Consult   Pharmacy Consult for Coumadin Indication: AVR/MVR, afib, DVT  No Known Allergies  Patient Measurements: Height: 5\' 6"  (167.6 cm) Weight: 159 lb 12.8 oz (72.485 kg) IBW/kg (Calculated) : 59.3  Vital Signs: Temp: 98.2 F (36.8 C) (04/04 0631) Temp src: Oral (04/04 0631) BP: 117/82 mmHg (04/04 0631) Pulse Rate: 75 (04/04 0631)  Labs:  Recent Labs  06/07/12 0535 06/08/12 0545 06/09/12 0538  HGB 10.2* 10.3* 9.8*  HCT 31.8* 32.5* 31.8*  PLT 406* 409* 368  LABPROT 29.3* 22.2* 21.5*  INR 2.96* 2.04* 1.95*  CREATININE 0.91 0.97 0.99    Estimated Creatinine Clearance: 53.2 ml/min (by C-G formula based on Cr of 0.99).   Assessment:  Pharmacy consulted to restart Coumadin in this 72yo female s/p ICD implantation 06/08/12.  Preprocedure diagnosis includes NICM,  CHF class III, PAF, bradycardia and s/p VF arrest, acute LUE DVT (superficial venous thrombosis) of the left cephalic and basilic veins.    Patient on Coumadin PTA for mechanical AVR and MV repair 08, PAF. Now with acute LUE DVT. Admit INR 5.05 on 2mg  MWF, 4mg  TTSS.  INR goal 2.5-3.5 per Dr. Rosezella [place] note 3/13.  Coumadin was discontinued on 3/31 in preparation for ICD.    INR today= 1.95  Goal of Therapy:  INR 2.5-3.5 Monitor platelets by anticoagulation protocol: Yes   Plan:  -Coumadin 3 mg tonight.  -Daily INR   Hildred Laser, Pharm D 06/09/2012 8:35 AM

## 2012-06-09 NOTE — Progress Notes (Signed)
SUBJECTIVE: The patient is doing well today.  At this time, she denies chest pain, shortness of breath, some left hand swelling, concerned about not having a bowel movement.  S/p ICD implant 06-08-2012.  Minimal incisional soreness this morning. Pending repeat speech eval to advance diet.  Pending CIR placement.  INR sub therapeutic today at 1.95  . amiodarone  200 mg Oral Daily  . carvedilol  6.25 mg Oral BID WC  .  ceFAZolin (ANCEF) IV  1 g Intravenous Q6H  . feeding supplement  237 mL Oral BID BM  . feeding supplement  1 Container Oral BID BM  . insulin aspart  0-9 Units Subcutaneous TID WC  . insulin glargine  10 Units Subcutaneous Q24H  . losartan  25 mg Oral Daily  . pantoprazole  40 mg Oral Daily  . polyethylene glycol  17 g Oral Daily  . sodium chloride  3 mL Intravenous Q12H  . Warfarin - Pharmacist Dosing Inpatient   Does not apply q1800      OBJECTIVE: Physical Exam: Filed Vitals:   06/08/12 1742 06/08/12 1811 06/08/12 2108 06/09/12 0631  BP: 114/55 114/61 107/41 117/82  Pulse: 64 70 83 75  Temp: 98.4 F (36.9 C)  97.7 F (36.5 C) 98.2 F (36.8 C)  TempSrc: Oral  Oral Oral  Resp: 18  18 20   Height:      Weight:    159 lb 12.8 oz (72.485 kg)  SpO2: 94%  94% 95%    Intake/Output Summary (Last 24 hours) at 06/09/12 0640 Last data filed at 06/08/12 1409  Gross per 24 hour  Intake    180 ml  Output    501 ml  Net   -321 ml    Telemetry reveals sinus rhythm, intermittent atrial pacing  GEN- The patient is well appearing, alert and oriented x 3 today.   Head- normocephalic, atraumatic Eyes-  Sclera clear, conjunctiva pink Ears- hearing intact Oropharynx- clear Neck- supple, no JVP ICD pocket is without hematoma Lungs- Clear to ausculation bilaterally, normal work of breathing Heart- Regular rate and rhythm, no murmurs, rubs or gallops, PMI not laterally displaced GI- soft, NT, ND, + BS Extremities- no clubbing, cyanosis, or edema Skin- no rash or  lesion Psych- euthymic mood, full affect Neuro- strength and sensation are intact  LABS: Basic Metabolic Panel:  Recent Labs  06/08/12 0545 06/09/12 0538  NA 139 138  K 4.2 4.0  CL 99 100  CO2 32 30  GLUCOSE 143* 124*  BUN 30* 27*  CREATININE 0.97 0.99  CALCIUM 9.0 8.6   CBC:  Recent Labs  06/08/12 0545 06/09/12 0538  WBC 9.7 9.8  HGB 10.3* 9.8*  HCT 32.5* 31.8*  MCV 86.9 88.1  PLT 409* 368   INR- 1.95 today  RADIOLOGY: CXR pending this morning  Device interrogation pending  ASSESSMENT AND PLAN:  Principal Problem:   Sudden cardiac arrest Active Problems:   Atrial fibrillation   Mitral valve disease   Type II or unspecified type diabetes mellitus without mention of complication, uncontrolled   GERD (gastroesophageal reflux disease)   Ischemic cardiomyopathy   Valvular heart disease-bicuspid aortic valve s/p Mechanical replacement//MV repair   Ventricular fibrillation   Acute respiratory failure   Warfarin-induced coagulopathy   Acute encephalopathy   Metabolic acidosis   Sustained ventricular fibrillation   Acute on chronic systolic heart failure   Atelectasis  1. VF arrest/ torsades  Doing well s/p ICD Awaiting cxr 2.  afib  Continue  amiodarone  Goal INR 2-3 3. Anemia Stable  To rehab today if bed is available

## 2012-06-09 NOTE — H&P (View-Only) (Signed)
Physical Medicine and Rehabilitation Admission H&P    No chief complaint on file. : HPI: [name] is a 72 y.o. right-handed female with history of atrial fibrillation on chronic Coumadin therapy, CVA 2008, severe aortic stenosis and mitral regurgitation, chronic systolic congestive heart failure and ischemic cardiomyopathy. Admitted 05/18/2012 after becoming unresponsive while riding in the car. On arrival to the ED was found to be in VF arrest. Team estimated 45 minutes of ACLS.  INR upon admission of greater than 5.00 and did receive fresh frozen plasma. Cranial CT scan showed no acute abnormalities. Question aspiration pneumonia and placed on Unasyn. Cardiology services consulted maintained on Levophed for pressure support. Troponin less than 0.30. EKG demonstrating wide QRS with prolonged QT. Patient maintained on ventilatory support. Upper extremity Dopplers 05/22/2012 did show findings consistent with superficial vein thrombosis left cephalic vein left basilic vein. Patient currently maintained on intravenous heparin as well as Coumadin therapy resumed. She remained intubated from 05/18/2012 at 05/23/2012. Persistent atrial fibrillation as well as bradycardia and underwent placement of ICD 06/08/2012 per Dr. Rayann Heman. Patient with chronic anemia 7.4 to 7.9 and monitored. Her diet was slowly advanced to a regular consistency. Physical therapy evaluation completed 05/24/2012 and ongoing with recommendations of physical medicine rehabilitation consult to consider inpatient rehabilitation services. Patient was admitted for comprehensive rehabilitation program  Review of Systems  Cardiovascular: Positive for palpitations and leg swelling.  Anginal pain  Gastrointestinal:  Reflux  Musculoskeletal: Positive for myalgias.  Neurological: Positive for weakness.  All other systems reviewed and are negative   Past Medical History  Diagnosis Date  . Anemia   . Atrial fibrillation     A.fib/flutter:  s/p MAZE 2008, DCCV 2011, on coumadin; previously on flecainide, but dc'd due to worsening EF  . Diverticulitis   . Valvular heart disease     bicuspid AV/severe AS, mitral regurgitation, & aneurysm of the ascending thoracic aorta (s/p AV replacement, MV repair, resection and grafting of the ascending thoracic aortic aneurysm 2008  . CVA (cerebral vascular accident)     2008 - felt due to oscillating calcium on aortic valve  . Carcinoma in situ of vulva   . Fibroid   . Ovarian cyst, left 2008-2009  . Osteoporosis   . Diabetes mellitus   . Hypertension   . GERD (gastroesophageal reflux disease)   . Chronic systolic CHF (congestive heart failure)     EF 35%  . Ischemic cardiomyopathy     EF 35%  . Coronary artery disease     Occluded LM 2/2 previous aortic root surgery; s/p 2v CABG 2010 LIMA to LAD, SVG to OM; Cath 03/11/12 patent SVG to OM, atretic LIMA to LAD, patent RCA, occluded native LM, EF 35%   . Shortness of breath   . Arthritis     OSTEO  . Fracture of lumbar spine    Past Surgical History  Procedure Laterality Date  . Foot surgery      removed neuroma  . Aortic root replacement  2008    homograft  . Mv repair  2008    due to severe MR  . Resection and grafting of ascending thoracic aortic    . Aneurysm and left sided maze  2008  . Coronary artery bypass graft  4/10    LIMA to LAD, SVG to OM  . Wide excision of vulva      CA INSITU   Family History  Problem Relation Age of Onset  . Hypertension Mother   .  Breast cancer Maternal Aunt     age 63's  . Cancer Paternal Grandmother     COLON   Social History:  reports that she quit smoking about 29 years ago. She has never used smokeless tobacco. She reports that she does not drink alcohol or use illicit drugs. Allergies: No Known Allergies Medications Prior to Admission  Medication Sig Dispense Refill  . Cholecalciferol (VITAMIN D3) 50000 UNITS CAPS Take 1 tablet by mouth once a week.  12 capsule  3  .  Cyanocobalamin (VITAMIN B-12) 2500 MCG SUBL Place 1 tablet under the tongue 3 (three) times a week. Dissolves 1 tablet under the tongue on Monday, Wednesday, and Friday      . dofetilide (TIKOSYN) 125 MCG capsule Take 3 capsules (375 mcg total) by mouth every 12 (twelve) hours.  180 capsule  6  . furosemide (LASIX) 20 MG tablet Take 20 mg by mouth every morning.      . magnesium oxide (MAG-OX) 400 MG tablet Take 800 mg by mouth 3 (three) times daily.      . metFORMIN (GLUCOPHAGE-XR) 500 MG 24 hr tablet Take 500-1,500 mg by mouth 2 (two) times daily with a meal. Takes 1 tablet in am and 3 tablets in pm      . metoprolol succinate (TOPROL-XL) 100 MG 24 hr tablet Take 100 mg by mouth every morning.       . metoprolol succinate (TOPROL-XL) 50 MG 24 hr tablet Take 50 mg by mouth at bedtime. Take with or immediately following a meal.      . MICARDIS 20 MG tablet TAKE 1/2 TABLET BY MOUTH EVERY DAY  30 tablet  1  . omeprazole (PRILOSEC OTC) 20 MG tablet Take 20 mg by mouth daily after breakfast.       . pioglitazone (ACTOS) 45 MG tablet Take 45 mg by mouth every morning.      Marland Kitchen spironolactone (ALDACTONE) 25 MG tablet Take 12.5 mg by mouth daily.      Marland Kitchen telmisartan (MICARDIS) 20 MG tablet Take 10 mg by mouth every morning.       . warfarin (COUMADIN) 2 MG tablet Take 2 mg by mouth 3 (three) times a week. Monday, Wednesday and Friday      . warfarin (COUMADIN) 4 MG tablet Take 4 mg by mouth 4 (four) times a week. Tuesday, Thursday, Saturday and Sunday        Home: Home Living Lives With: Spouse Available Help at Discharge: Family;Available 24 hours/day Type of Home: House Home Access: Stairs to enter Home Layout: One level Home Adaptive Equipment: None   Functional History: Prior Function Able to Take Stairs?: Yes Driving: Yes Vocation: Retired Comments: did get out of breath with long distances of walking  Functional Status:  Mobility: Bed Mobility Bed Mobility: Scooting to HOB Supine to  Sit: 5: Supervision;HOB elevated;With rails Sitting - Scoot to Edge of Bed: 5: Supervision;With rail Scooting to [place]: 1: +2 Total assist Scooting to [facility]: Patient Percentage: 20% Transfers Transfers: Sit to Stand;Stand to Sit Sit to Stand: 4: Min assist;With upper extremity assist;From chair/3-in-1;From bed;With armrests Sit to Stand: Patient Percentage: 60% Stand to Sit: 4: Min assist;To chair/3-in-1;To bed Stand to Sit: Patient Percentage: 60% Stand Pivot Transfers: 4: Min assist Ambulation/Gait Ambulation/Gait Assistance: 4: Min assist Ambulation/Gait: Patient Percentage: 60% Ambulation Distance (Feet): 120 Feet Assistive device: Rolling walker Ambulation/Gait Assistance Details: stayed in room at pt's request; multiple trips to and from door with turns and assist needed for  safety with walker management around obstacles and with turns. Gait Pattern: Step-through pattern;Trunk flexed;Shuffle Gait velocity: decreased General Gait Details: VCs to correct flexed posture, HHA of 2 for balance Stairs: No Wheelchair Mobility Wheelchair Mobility: No  ADL: ADL Grooming: Dance movement psychotherapist;Teeth care;Moderate assistance (terminates task prematurely) Where Assessed - Grooming: Unsupported standing (leaning against sink level for support) Upper Body Bathing: Simulated;Moderate assistance Where Assessed - Upper Body Bathing: Supported sitting Lower Body Bathing: Moderate assistance Where Assessed - Lower Body Bathing: Supported sit to stand Upper Body Dressing: Simulated;Moderate assistance Where Assessed - Upper Body Dressing: Supported sitting Lower Body Dressing: Performed;Maximal assistance Where Assessed - Lower Body Dressing: Supported sitting Toilet Transfer: Minimal assistance Toilet Transfer Method: Sit to stand Toilet Transfer Equipment: Raised toilet seat with arms (or 3-in-1 over toilet) Equipment Used: Rolling walker Transfers/Ambulation Related to ADLs: Pt able to walk to sink  from recliner with Min (A) and when stopping has a posterior lean that requires MIn (A) to correct. Pt with hgb 7.3 with pending transfusion but very willing to participate. ADL Comments: Pt requesting 3n1 upon standing. pt without void however smearing present . Pt attempting to wipe and needs assistance due to smearing and not fully cleaning the buttock area. Pt ambulated to sink with heavy lean on surface for stability. Pt terminating task after 3 brushes requesting 3n1 again. Pt needed extended time to sequence oral care and hand washing. Pt with small stool second attempt. Barrier cream applied to buttock. Pt able to initiate and maintain static squat for hygiene for 30seconds x2 .   Cognition: Cognition Overall Cognitive Status: Impaired Arousal/Alertness: Awake/alert Orientation Level: Oriented X4 Attention: Sustained Sustained Attention: Impaired Sustained Attention Impairment: Verbal basic;Functional basic Memory: Impaired Memory Impairment: Storage deficit;Retrieval deficit;Decreased recall of new information;Decreased short term memory;Prospective memory Decreased Short Term Memory: Verbal basic;Functional basic Awareness: Impaired Awareness Impairment: Intellectual impairment;Emergent impairment;Anticipatory impairment Problem Solving: Impaired Problem Solving Impairment: Verbal basic;Functional basic Executive Function: Self Correcting;Self Monitoring;Initiating;Organizing;Decision Making Organizing: Impaired Safety/Judgment: Impaired Cognition Overall Cognitive Status: Appears within functional limits for tasks assessed/performed Area of Impairment: Attention;Memory;Awareness of errors;Awareness of deficits;Problem solving;Executive functioning Arousal/Alertness: Awake/alert Orientation Level: Appears intact for tasks assessed Behavior During Session: [facility] for tasks performed Current Attention Level: Alternating Attention - Other Comments: Pt very distractable.  Once  distracted from the task pt needed max cues and sometimes physical cues to resume the task at hand. Memory: Decreased recall of precautions Memory Deficits: Pt unable to recall where she lives in [place] but did recall what she had for breakfast, husbands name etc. Following Commands: Follows one step commands with increased time Safety/Judgement: Decreased awareness of need for assistance Awareness of Errors: Assistance required to identify errors made;Assistance required to correct errors made Awareness of Errors - Other Comments: Pt unaware when she is leaning over but with cues was able to correct.  Awareness of Deficits:   Problem Solving: Increased time and step by step cueing for transfers.   Executive Functioning: severely impaired. Cognition - Other Comments: slow processing, - made errors but needed extended time to recognize then correct  Physical Exam: Blood pressure 117/47, pulse 75, temperature 98.2 F (36.8 C), temperature source Oral, resp. rate 20, height 5\' 6"  (1.676 m), weight 72.485 kg (159 lb 12.8 oz), SpO2 95.00%. Physical Exam  Vitals reviewed.  Constitutional:  72 year old white female in no acute distress HENT: oral mucosa pink and moist. Head: Normocephalic.  Eyes: EOM are normal.  Neck: Neck supple. No thyromegaly present. No JVD or LAD  Cardiovascular:  Cardiac rate control. Without murmur rubs or gallops Pulmonary/Chest:  Decreased breath sounds at the bases. No distress, no rales Abdominal: Bowel sounds are normal. She exhibits no distension. No pain Musculoskeletal: no overt tenderness. Generally normal ROM   Neurological: She is alert.   she is oriented x3 to person, place and date of birth. She was able to name the hospital. Followed simple commands. A little slow to process but generally appropriate. Fair insight and awareness.  Strength nearly symmetrical and grossly 3+ to 4/5 prox to 4+/5 distally in UE. In LE she is 3 at HF, 3+ KE, 4/5 at ankles .  No sensory abnl. dtr's are 2+ Skin: Skin is warm and dry. ICD site clean,dressed and intact left chest. Psychiatric: She has a flat mood and affect. Her behavior is normal   Results for orders placed during the hospital encounter of 05/18/12 (from the past 48 hour(s))  GLUCOSE, CAPILLARY     Status: Abnormal   Collection Time    06/07/12 11:39 AM      Result Value Range   Glucose-Capillary 229 (*) 70 - 99 mg/dL   Comment 1 Notify RN    GLUCOSE, CAPILLARY     Status: Abnormal   Collection Time    06/07/12  5:03 PM      Result Value Range   Glucose-Capillary 161 (*) 70 - 99 mg/dL   Comment 1 Notify RN    GLUCOSE, CAPILLARY     Status: Abnormal   Collection Time    06/07/12  9:25 PM      Result Value Range   Glucose-Capillary 184 (*) 70 - 99 mg/dL   Comment 1 Notify RN    BASIC METABOLIC PANEL     Status: Abnormal   Collection Time    06/08/12  5:45 AM      Result Value Range   Sodium 139  135 - 145 mEq/L   Potassium 4.2  3.5 - 5.1 mEq/L   Chloride 99  96 - 112 mEq/L   CO2 32  19 - 32 mEq/L   Glucose, Bld 143 (*) 70 - 99 mg/dL   BUN 30 (*) 6 - 23 mg/dL   Creatinine, Ser 0.97  0.50 - 1.10 mg/dL   Calcium 9.0  8.4 - 10.5 mg/dL   GFR calc non Af Amer 57 (*) >90 mL/min   GFR calc Af Amer 67 (*) >90 mL/min   Comment:            The eGFR has been calculated     using the CKD EPI equation.     This calculation has not been     validated in all clinical     situations.     eGFR's persistently     <90 mL/min signify     possible Chronic Kidney Disease.  CBC     Status: Abnormal   Collection Time    06/08/12  5:45 AM      Result Value Range   WBC 9.7  4.0 - 10.5 K/uL   RBC 3.74 (*) 3.87 - 5.11 MIL/uL   Hemoglobin 10.3 (*) 12.0 - 15.0 g/dL   HCT 32.5 (*) 36.0 - 46.0 %   MCV 86.9  78.0 - 100.0 fL   MCH 27.5  26.0 - 34.0 pg   MCHC 31.7  30.0 - 36.0 g/dL   RDW 16.2 (*) 11.5 - 15.5 %   Platelets 409 (*) 150 - 400 K/uL  PROTIME-INR  Status: Abnormal   Collection Time     06/08/12  5:45 AM      Result Value Range   Prothrombin Time 22.2 (*) 11.6 - 15.2 seconds   INR 2.04 (*) 0.00 - 1.49  GLUCOSE, CAPILLARY     Status: Abnormal   Collection Time    06/08/12  8:06 AM      Result Value Range   Glucose-Capillary 143 (*) 70 - 99 mg/dL   Comment 1 Documented in Chart     Comment 2 Notify RN    GLUCOSE, CAPILLARY     Status: Abnormal   Collection Time    06/08/12 12:04 PM      Result Value Range   Glucose-Capillary 125 (*) 70 - 99 mg/dL   Comment 1 Documented in Chart     Comment 2 Notify RN    GLUCOSE, CAPILLARY     Status: Abnormal   Collection Time    06/08/12  5:30 PM      Result Value Range   Glucose-Capillary 115 (*) 70 - 99 mg/dL   Comment 1 Documented in Chart     Comment 2 Notify RN    GLUCOSE, CAPILLARY     Status: Abnormal   Collection Time    06/08/12  9:06 PM      Result Value Range   Glucose-Capillary 157 (*) 70 - 99 mg/dL  BASIC METABOLIC PANEL     Status: Abnormal   Collection Time    06/09/12  5:38 AM      Result Value Range   Sodium 138  135 - 145 mEq/L   Potassium 4.0  3.5 - 5.1 mEq/L   Chloride 100  96 - 112 mEq/L   CO2 30  19 - 32 mEq/L   Glucose, Bld 124 (*) 70 - 99 mg/dL   BUN 27 (*) 6 - 23 mg/dL   Creatinine, Ser 0.99  0.50 - 1.10 mg/dL   Calcium 8.6  8.4 - 10.5 mg/dL   GFR calc non Af Amer 56 (*) >90 mL/min   GFR calc Af Amer 65 (*) >90 mL/min   Comment:            The eGFR has been calculated     using the CKD EPI equation.     This calculation has not been     validated in all clinical     situations.     eGFR's persistently     <90 mL/min signify     possible Chronic Kidney Disease.  PROTIME-INR     Status: Abnormal   Collection Time    06/09/12  5:38 AM      Result Value Range   Prothrombin Time 21.5 (*) 11.6 - 15.2 seconds   INR 1.95 (*) 0.00 - 1.49  CBC     Status: Abnormal   Collection Time    06/09/12  5:38 AM      Result Value Range   WBC 9.8  4.0 - 10.5 K/uL   RBC 3.61 (*) 3.87 - 5.11 MIL/uL    Hemoglobin 9.8 (*) 12.0 - 15.0 g/dL   HCT 31.8 (*) 36.0 - 46.0 %   MCV 88.1  78.0 - 100.0 fL   MCH 27.1  26.0 - 34.0 pg   MCHC 30.8  30.0 - 36.0 g/dL   RDW 16.3 (*) 11.5 - 15.5 %   Platelets 368  150 - 400 K/uL  GLUCOSE, CAPILLARY     Status: Abnormal   Collection Time  06/09/12  7:59 AM      Result Value Range   Glucose-Capillary 125 (*) 70 - 99 mg/dL   Comment 1 Notify RN     No results found.  Post Admission Physician Evaluation: 1. 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 2. Patient is admitted to receive collaborative, interdisciplinary care between the physiatrist, rehab nursing staff, and therapy team. 3. Patient's level of medical complexity and substantial therapy needs in context of that medical necessity cannot be provided at a lesser intensity of care such as a SNF. 4. Patient has experienced substantial functional loss from his/her baseline which was documented above under the "Functional History" and "Functional Status" headings.  Judging by the patient's diagnosis, physical exam, and functional history, the patient has potential for functional progress which will result in measurable gains while on inpatient rehab.  These gains will be of substantial and practical use upon discharge  in facilitating mobility and self-care at the household level. 5. Physiatrist will provide 24 hour management of medical needs as well as oversight of the therapy plan/treatment and provide guidance as appropriate regarding the interaction of the two. 6. 24 hour rehab nursing will assist with bladder management, bowel management, safety, skin/wound care, disease management, medication administration, pain management and patient education  and help integrate therapy concepts, techniques,education, etc. 7. PT will assess and treat for/with: Lower extremity strength, range of motion, stamina, balance, functional  mobility, safety, adaptive techniques and equipment, NMR, cognitive perceptual training, education.   Goals are: supervision to mod I. 8. OT will assess and treat for/with: Lower extremity strength, range of motion, stamina, balance, functional mobility, safety, adaptive techniques and equipment, NMR, cognitive perceptual training, educatrion.   Goals are: mod I to supervision. 9. SLP will assess and treat for/with: cognition.  Goals are: mod I to supervision. 10. Case Management and Social Worker will assess and treat for psychological issues and discharge planning. 11. Team conference will be held weekly to assess progress toward goals and to determine barriers to discharge. 12. Patient will receive at least 3 hours of therapy per day at least 5 days per week. 13. ELOS: 10 days      Prognosis:  excellent   Medical Problem List and Plan: 1. deconditioning after cardiac arrest status post ICD placement, respiratory failure, multi-medical issues 2. DVT Prophylaxis/Anticoagulation: Chronic Coumadin therapy. INR 1.95 06/09/2012 3. Pain Management: tylenol,hydrocodone as needed.   -pain under fair control at present. 4. Neuropsych: This patient is capable of making decisions on her own behalf. 5. Atrial fibrillation/bradycardia. Status post ICD placement 06/09/2012. Amiodarone 200 mg daily, coreg 6.25 mg twice a day, Cozaar 25 mg daily. Monitor rates with increased physical activity.  6. Diabetes mellitus with peripheral neuropathy. Latest hemoglobin A1c 6.2. Lantus insulin 10 units daily. Check blood sugars a.c. and at bedtime. Patient on Glucophage 500 mg twice a day prior to admission  -adjust regimen as needed. 7. Chronic anemia 7.9 and monitored. 8. Constipation: received enema today. Continue work on evacuation and then regular maintenance regimen 9. GERD: protonix  Meredith Staggers, MD, Mellody Drown     06/09/2012

## 2012-06-09 NOTE — Progress Notes (Signed)
Occupational Therapy Treatment Patient Details Name: [name] MRN: [id number] DOB: September 23, 1940 Today's Date: 06/09/2012 Time: [id number] OT Time Calculation (min): 25 min  OT Assessment / Plan / Recommendation Comments on Treatment Session Pt continues with great progress.  Pt much more talkative today and progressing well with adls and cognition.    Follow Up Recommendations  CIR    Barriers to Discharge       Equipment Recommendations  3 in 1 bedside comode    Recommendations for Other Services    Frequency Min 2X/week   Plan Discharge plan remains appropriate    Precautions / Restrictions Precautions Precautions: Fall Precaution Comments: LUE DVT, on heparin, thoracic and lumbar compression fractures Restrictions Weight Bearing Restrictions: No   Pertinent Vitals/Pain Pt with no reports of pain.    ADL  Eating/Feeding: Performed;Set up Where Assessed - Eating/Feeding: Bed level;Other (comment) (pt does not like sitting in chair.) Grooming: Performed;Wash/dry hands;Teeth care;Set up Where Assessed - Grooming: Supported standing (Pt with much improved initiation today.) Upper Body Dressing: Performed;Set up Where Assessed - Upper Body Dressing: Unsupported sitting Lower Body Dressing: Performed;Minimal assistance Where Assessed - Lower Body Dressing: Supported sit to stand Toilet Transfer: Performed;Minimal assistance Toilet Transfer Method: Stand Ecologist: Raised toilet seat with arms (or 3-in-1 over toilet) Toileting - Clothing Manipulation and Hygiene: Moderate assistance Where Assessed - Toileting Clothing Manipulation and Hygiene: Sit to stand from 3-in-1 or toilet Equipment Used: Rolling walker Transfers/Ambulation Related to ADLs: Pt walked to sink and to bathroom to do toileting.  Pt with much improved balance and tolerance for activity today although still unsafe with walker use.  Pt likes to leave walker sitting off to side and not use it  at all times. ADL Comments: Pt with increased initiation today and faster processing times during adls at the sink today.  Pt able to complete the task with no verbal cues to continue once task was started.    OT Diagnosis:    OT Problem List:   OT Treatment Interventions:     OT Goals Acute Rehab OT Goals OT Goal Formulation: With patient/family Time For Goal Achievement: 06/08/12 Potential to Achieve Goals: Good ADL Goals Pt Will Perform Grooming: with supervision;Sitting, chair;Sitting, edge of bed;Unsupported ADL Goal: Grooming - Progress: Met Pt Will Perform Upper Body Bathing: with supervision;Sitting, chair;Sitting, edge of bed;Unsupported ADL Goal: Upper Body Bathing - Progress: Progressing toward goals Pt Will Perform Lower Body Bathing: with min assist;Sitting, chair;Sitting, edge of bed;Unsupported ADL Goal: Lower Body Bathing - Progress: Progressing toward goals Pt Will Transfer to Toilet: with supervision;Stand pivot transfer;with DME;3-in-1 ADL Goal: Toilet Transfer - Progress: Progressing toward goals Miscellaneous OT Goals Miscellaneous OT Goal #1: Pt will perform bed mobility with superivsion as precursor for EOB ADLs. OT Goal: Miscellaneous Goal #1 - Progress: Met Miscellaneous OT Goal #2: Pt will use LUE to reach for and place 5/5 items in container in order to improve cooridnation and awareness as precursor for ADLs. OT Goal: Miscellaneous Goal #2 - Progress: Progressing toward goals Miscellaneous OT Goal #3: Pt will perform static sitting balance task >10 min at sup level as precursor for ADL retraining. OT Goal: Miscellaneous Goal #3 - Progress: Met  Visit Information  Last OT Received On: 06/09/12 Assistance Needed: +1    Subjective Data      Prior Functioning       Cognition  Cognition Overall Cognitive Status: Impaired Area of Impairment: Attention;Memory;Awareness of deficits Arousal/Alertness: Awake/alert Orientation Level: Oriented X4 /  Intact;Other (comment) (one cue for month.) Behavior During Session: [facility] for tasks performed Current Attention Level: Selective Attention - Other Comments: Pt less distractable today and able to focus on one activity at time.  If given two activities in a row to do, pt would need cues to continue on in between tasks and start the next task. Memory: Decreased recall of precautions Memory Deficits: Pt was able to recall neighborhood she lives in and was more oriented to daily routine today. Following Commands: Follows one step commands consistently Safety/Judgement: Decreased awareness of need for assistance Awareness of Errors: Assistance required to identify errors made;Assistance required to correct errors made Problem Solving: Pt able to complete problems with less step by step cues. Cognition - Other Comments: improving    Mobility  Bed Mobility Bed Mobility: Supine to Sit;Sitting - Scoot to Edge of Bed Supine to Sit: 5: Supervision;HOB elevated;With rails Sitting - Scoot to Edge of Bed: 5: Supervision;With rail Details for Bed Mobility Assistance: pt did require cues to scoot closer to edge of bed before standing. Transfers Transfers: Sit to Stand;Stand to Sit Sit to Stand: 4: Min guard;From bed Stand to Sit: 5: Supervision;To chair/3-in-1 Details for Transfer Assistance: Pt becoming more steady on her feet during transfers.  Needed cues for hand positioning when transferring but still likes to leave walker behind in middle of pivot transfers.    Exercises      Balance     End of Session OT - End of Session Activity Tolerance: Patient tolerated treatment well Patient left: in bed;with call bell/phone within reach Nurse Communication: Mobility status;Precautions  GO     Leander, Favret 06/09/2012, 12:23 PM [phone]

## 2012-06-09 NOTE — Progress Notes (Signed)
Physical Therapy Treatment Patient Details Name: [name] MRN: [id number] DOB: 09-May-1940 Today's Date: 06/09/2012 Time: [id number] PT Time Calculation (min): 36 min  PT Assessment / Plan / Recommendation Comments on Treatment Session  Feel progress continues with pt able to walk further. ICD implanted yesterday with pt needing cues to not use left UE with treatment today. Still feel she can benefit from intensive multidisciplinary inpatient stay to maximize independence and safety prior to d/c home.  CIR to take pt today.  Limited progression of amcbulation secondary to pt impacted and had to use 3N1 x2.      Follow Up Recommendations  CIR                 Equipment Recommendations  Rolling walker with 5" wheels        Frequency Min 3X/week   Plan Discharge plan remains appropriate;Frequency remains appropriate    Precautions / Restrictions Precautions Precautions: Fall Precaution Comments: LUE DVT, on heparin, thoracic and lumbar compression fractures Restrictions Weight Bearing Restrictions: No   Pertinent Vitals/Pain VSS, No pain    Mobility  Bed Mobility Bed Mobility: Supine to Sit;Sitting - Scoot to Edge of Bed Supine to Sit: 5: Supervision;HOB elevated;With rails Sitting - Scoot to Edge of Bed: 5: Supervision;With rail Scooting to [facility]: Not tested (comment) Details for Bed Mobility Assistance: pt did require cues to scoot closer to edge of bed before standing. Transfers Transfers: Stand to Sit;Sit to Stand;Stand Pivot Transfers Sit to Stand: 4: Min guard;From bed;With upper extremity assist Stand to Sit: 5: Supervision;To bed;With upper extremity assist Stand Pivot Transfers: 4: Min assist Details for Transfer Assistance: Pt becoming more steady on her feet during transfers.  Needed cues for hand positioning when transferring but still likes to leave walker behind in middle of pivot transfers. Ambulation/Gait Ambulation/Gait Assistance: 4: Min assist Ambulation  Distance (Feet): 80 Feet Assistive device: Rolling walker Ambulation/Gait Assistance Details: Pt initially got up to 3N1 secondary felt like bowels were going to move.  Pt then aMbulated with RW out into hallway and back.  Needed assist to steer RW.   Gait Pattern: Step-through pattern;Trunk flexed;Shuffle Gait velocity: decreased General Gait Details: VCs to correct flexed posture Stairs: No Wheelchair Mobility Wheelchair Mobility: No    PT Goals Acute Rehab PT Goals PT Goal: Rolling Supine to Right Side - Progress: Progressing toward goal PT Goal: Rolling Supine to Left Side - Progress: Progressing toward goal PT Goal: Supine/Side to Sit - Progress: Progressing toward goal PT Goal: Sit at Edge Of Bed - Progress: Progressing toward goal PT Goal: Sit to Supine/Side - Progress: Progressing toward goal PT Goal: Sit to Stand - Progress: Progressing toward goal PT Goal: Stand to Sit - Progress: Progressing toward goal PT Goal: Stand - Progress: Progressing toward goal PT Goal: Ambulate - Progress: Progressing toward goal  Visit Information  Last PT Received On: 06/09/12 Assistance Needed: +1    Subjective Data  Subjective: "I will try.  I am still impacted."   Cognition  Cognition Overall Cognitive Status: Impaired Area of Impairment: Attention;Memory;Awareness of deficits Arousal/Alertness: Awake/alert Orientation Level: Oriented X4 / Intact;Other (comment) Behavior During Session: [facility] for tasks performed Current Attention Level: Selective Attention - Other Comments: Pt less distractable today and able to focus on one activity at time.  If given two activities in a row to do, pt would need cues to continue on in between tasks and start the next task. Memory: Decreased recall of precautions Memory Deficits: Pt was  able to recall neighborhood she lives in and was more oriented to daily routine today. Following Commands: Follows one step commands consistently Safety/Judgement:  Decreased awareness of need for assistance Awareness of Errors: Assistance required to identify errors made;Assistance required to correct errors made Awareness of Errors - Other Comments: Pt unaware when she is leaning over but with cues was able to correct.  Problem Solving: Pt able to complete problems with less step by step cues. Executive Functioning: severely impaired. Cognition - Other Comments: improving    Balance  Static Sitting Balance Static Sitting - Balance Support: Feet supported;Bilateral upper extremity supported Static Sitting - Level of Assistance: 5: Stand by assistance Static Standing Balance Static Standing - Balance Support: Left upper extremity supported;Right upper extremity supported;During functional activity Static Standing - Level of Assistance: 5: Stand by assistance Static Standing - Comment/# of Minutes: Stood 5 minutes to be cleaned of BM x2 during treatment Dynamic Standing Balance Dynamic Standing - Balance Support: During functional activity;Left upper extremity supported;Right upper extremity supported Dynamic Standing - Level of Assistance: 4: Min assist  End of Session PT - End of Session Equipment Utilized During Treatment: Gait belt Activity Tolerance: Patient limited by fatigue Patient left: in bed;with call bell/phone within reach;with family/visitor present Nurse Communication: Mobility status        INGOLD,Kharter Brew 06/09/2012, 4:41 PM [facility] Acute Rehabilitation [phone] (pager)

## 2012-06-09 NOTE — Progress Notes (Signed)
Pt was asked if any family members could be called and made aware of her transfer to [address]. Pt said she did not want [place] to call anybody at this time.
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
## 2012-06-09 NOTE — Progress Notes (Signed)
Pt D/C to unit 4000. Report given to [facility], [place]. V/S stable.

## 2012-06-10 ENCOUNTER — Inpatient Hospital Stay (HOSPITAL_COMMUNITY): Payer: Medicare Other | Admitting: *Deleted

## 2012-06-10 ENCOUNTER — Inpatient Hospital Stay (HOSPITAL_COMMUNITY): Payer: Medicare Other | Admitting: Physical Therapy

## 2012-06-10 DIAGNOSIS — R5381 Other malaise: Secondary | ICD-10-CM

## 2012-06-10 DIAGNOSIS — I469 Cardiac arrest, cause unspecified: Secondary | ICD-10-CM

## 2012-06-10 DIAGNOSIS — I4891 Unspecified atrial fibrillation: Secondary | ICD-10-CM

## 2012-06-10 LAB — GLUCOSE, CAPILLARY
Glucose-Capillary: 127 mg/dL — ABNORMAL HIGH (ref 70–99)
Glucose-Capillary: 132 mg/dL — ABNORMAL HIGH (ref 70–99)
Glucose-Capillary: 187 mg/dL — ABNORMAL HIGH (ref 70–99)

## 2012-06-10 NOTE — Progress Notes (Signed)
Overall Plan of Care [facility]) Patient Details Name: [name] MRN: [id number] DOB: 04/13/1940  Diagnosis:  Deconditioning after cardiac arrest  Co-morbidities: afib, dm, anemia, peripheral neuropathy  Functional Problem List  Patient demonstrates impairments in the following areas: Balance, Endurance and Motor  Basic ADL's: grooming, bathing, dressing, toileting and toilet and shower transfers Advanced ADL's: simple meal preparation, laundry and light housekeeping  Transfers:  bed to chair, toilet, tub/shower, car and furniture Locomotion:  ambulation and stairs  Additional Impairments:  Functional use of upper extremity  Anticipated Outcomes Item Anticipated Outcome  Eating/Swallowing    Basic self-care  MOD I; MINIMAL ASSIST WITH SHOWER TRANSFERS  Tolieting  MOD I  Bowel/Bladder  Mod I  Transfers  Mod I  Locomotion  Mod I  Communication    Cognition    Pain  Less than 2  Safety/Judgment    Other  HOMEMAKING DUTIES- SUP   Therapy Plan: PT Intensity: Minimum of 1-2 x/day ,45 to 90 minutes PT Frequency: 5 out of 7 days PT Duration Estimated Length of Stay: 7-10 days OT Intensity: Minimum of 1-2 x/day, 45 to 90 minutes OT Frequency: 5 out of 7 days OT Duration/Estimated Length of Stay: 7-10 days      Team Interventions: Item RN PT OT SLP SW TR Other  Self Care/Advanced ADL Retraining   X      Neuromuscular Re-Education  x X      Therapeutic Activities  x X      UE/LE Strength Training/ROM  x X      UE/LE Coordination Activities  x X      Visual/Perceptual Remediation/Compensation         DME/Adaptive Equipment Instruction  x X      Therapeutic Exercise  x X      Balance/Vestibular Training  x X      Patient/Family Education  x X      Cognitive Remediation/Compensation         Functional Mobility Training  x X      Ambulation/Gait Training  x       Primary school teacher Reintegration  x X      Dysphagia/Aspiration Environmental consultant         Bladder Management x        Bowel Management x        Disease Management/Prevention  x       Pain Management x  X      Medication Management x        Skin Care/Wound Management x        Splinting/Orthotics         Discharge Planning  x X      Psychosocial Support                                Team Discharge Planning: Destination: PT-Home ,OT-  HOME , SLP-  Projected Follow-up: PT-Outpatient PT;Other (comment) (vs. Cardiac rehab), OT-HOME HEALTH   , SLP-  Projected Equipment Needs: PT-Other (comment) (RW vs. rollator for longer distances), OT-SHOWER BENCH, HH SHOWER HOSE  , SLP-  Patient/family involved in discharge planning: PT- Patient;Family member/caregiver,  OT-Patient/CAREGIVER, SLP-   MD ELOS: 7-10 days  Medical Rehab Prognosis:  Excellent Assessment: The patient has been admitted for CIR therapies. The team will be addressing, functional mobility, strength, stamina, balance, safety, adaptive techniques/equipment, self-care, bowel and bladder mgt, patient and caregiver education, cognitive screening. Goals have been set at modified independent. Pt is motivated and husband is supportive.    Meredith Staggers, MD, FAAPMR      See Team Conference Notes for weekly updates to the plan of care

## 2012-06-10 NOTE — Evaluation (Signed)
Occupational Therapy Assessment and Plan  Patient Details  Name: [name] MRN: [id number] Date of Birth: 27-Nov-1940  OT Diagnosis: abnormal posture and muscle weakness (generalized) Rehab Potential: Rehab Potential: Good ELOS: 7-10 days   Today's Date: 06/10/2012 Time:  - 1100-1230  (90 min)  1st session                1515-1600  (45 min) 2nd session  (see note below)  Problem List:  Patient Active Problem List  Diagnosis  . VITAMIN B12 DEFICIENCY  . VITAMIN D DEFICIENCY  . HYPERLIPIDEMIA  . UNSPECIFIED IRON DEFICIENCY ANEMIA  . Essential hypertension, benign  . Atrial fibrillation  . CHOLELITHIASIS  . Celiac disease  . Mitral valve disease  . Encounter for long-term (current) use of anticoagulants  . Carcinoma in situ of vulva  . Fibroid  . Ovarian cyst, left  . Osteoporosis, idiopathic  . Type II or unspecified type diabetes mellitus without mention of complication, uncontrolled  . GERD (gastroesophageal reflux disease)  . Ischemic cardiomyopathy  . Back pain, thoracic  . Urinary frequency  . Bruit  . 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 Hypomagnesemia  . Ventricular fibrillation  . Acute respiratory failure  . Warfarin-induced coagulopathy  . Acute encephalopathy  . Metabolic acidosis  . Sustained ventricular fibrillation  . Sudden cardiac arrest  . Acute on chronic systolic heart failure  . Atelectasis    Past Medical History:  Past Medical History  Diagnosis Date  . Anemia   . Atrial fibrillation     A.fib/flutter: s/p MAZE 2008, DCCV 2011, on coumadin; previously on flecainide, but dc'd due to worsening EF. Tikosyn discontinued 06/2012 after cardiac arrest.  . Diverticulitis   . CVA (cerebral vascular accident)     2008 - felt due to oscillating calcium on aortic valve  . Carcinoma in situ of vulva   . Fibroid   . Ovarian cyst, left 2008-2009  . Osteoporosis   . Diabetes mellitus   . Hypertension    . GERD (gastroesophageal reflux disease)   . Chronic systolic CHF (congestive heart failure)     EF 35%  . Ischemic cardiomyopathy     EF 35%  . Coronary artery disease     Occluded LM 2/2 previous aortic root surgery; s/p 2v CABG 2010 LIMA to LAD, SVG to OM; Cath 03/11/12 patent SVG to OM, atretic LIMA to LAD, patent RCA, occluded native LM, EF 35%   . Arthritis     OSTEO  . Fracture of lumbar spine   . 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  . Superficial venous thrombosis of upper extremity     06/2012 - LUE  . Severe aortic stenosis     s/p homograft aortic root replacement 2008  . Mitral regurgitation     s/p mitral valve repair 2008  . Thoracic aortic aneurysm     s/p resection and grafting 2008   Past Surgical History:  Past Surgical History  Procedure Laterality Date  . Foot surgery      removed neuroma  . Aortic root replacement  2008    homograft  . Mv repair  2008    due to severe MR  . Resection and grafting of ascending thoracic aortic    . Aneurysm and left sided maze  2008  . Coronary artery bypass  graft  4/10    LIMA to LAD, SVG to OM  . Wide excision of vulva      CA INSITU    Assessment & Plan Clinical Impression: [name] is a 72 y.o. right-handed female with history of atrial fibrillation on chronic Coumadin therapy, CVA 2008, severe aortic stenosis and mitral regurgitation, chronic systolic congestive heart failure and ischemic cardiomyopathy. Admitted 05/18/2012 after becoming unresponsive while riding in the car. On arrival to the ED was found to be in VF arrest. Team estimated 45 minutes of ACLS.  INR upon admission of greater than 5.00 and did receive fresh frozen plasma. Cranial CT scan showed no acute abnormalities. Question aspiration pneumonia and placed on Unasyn. Cardiology services  consulted maintained on Levophed for pressure support. Troponin less than 0.30. EKG demonstrating wide QRS with prolonged QT. Patient maintained on ventilatory support. Upper extremity Dopplers 05/22/2012 did show findings consistent with superficial vein thrombosis left cephalic vein left basilic vein. Patient currently maintained on intravenous heparin as well as Coumadin therapy resumed. She remained intubated from 05/18/2012 at 05/23/2012. Persistent atrial fibrillation as well as bradycardia and underwent placement of ICD 06/08/2012 per Dr. Rayann Heman. Patient with chronic anemia 7.4 to 7.9 and monitored. Her diet was slowly advanced to a regular consistency   Patient transferred to CIR on 06/09/2012 .    Patient currently requires min with basic self-care skills secondary to decreased cardiorespiratoy endurance.  Prior to hospitalization, patient could complete  BADL with independent .  Patient will benefit from skilled intervention to increase independence with basic self-care skills and increase level of independence with iADL prior to discharge home with care partner.  Anticipate patient will require intermittent supervision and follow up home health.  OT - End of Session Activity Tolerance: Tolerates < 10 min activity with changes in vital signs Endurance Deficit: Yes Endurance Deficit Description: cardiac impairments OT Assessment Rehab Potential: Good OT Plan OT Intensity: Minimum of 1-2 x/day, 45 to 90 minutes OT Frequency: 5 out of 7 days OT Duration/Estimated Length of Stay: 7-10 days OT Treatment/Interventions: Balance/vestibular training;Discharge planning;DME/adaptive equipment instruction;Functional mobility training;Patient/family education;Self Care/advanced ADL retraining;Skin care/wound managment;Therapeutic Activities;Therapeutic Exercise;UE/LE Strength taining/ROM   Skilled Therapeutic Intervention : 2nd session 1515- 1600  (45 min) Individual session:  Engaged in therapeutic  transfers to shower stall using RW.  Gave pt detailed instructions and demonstration.  Pt needed minimal assist with transfer and instructions repeated during the transfer.  Question some mild cognition issues from hospitalization.    Spoke to husband about obtaining the dimensions of the shower door to see if walker would fit.  Also discussed grab bar placement in future as well as HH shower hose.  Pt has another bathroom with a tub that has a HH shower hose.    OT Evaluation Precautions/Restrictions  Precautions Precautions: Fall;ICD/Pacemaker Precaution Comments: LUE DVT, on heparin, thoracic and lumbar compression fractures, AROM restrictions secondary to ICD placement (no shoulder flexion to 90 until Monday) Restrictions Weight Bearing Restrictions: Yes Other Position/Activity Restrictions:  (no lifting or ROM past 90 degrees)     Pain Pain Assessment Pain Assessment: No/denies pain Home Living/Prior Functioning Home Living Lives With: Spouse Available Help at Discharge: Family;Available 24 hours/day Type of Home: House Home Access: Stairs to enter [employer] of Steps: 2 Entrance Stairs-Rails: None Home Layout: One level Bathroom Shower/Tub: Walk-in shower;Door [employer] Toilet: Standard Bathroom Accessibility: Yes How Accessible: Accessible via walker Home Adaptive Equipment: None IADL History Homemaking Responsibilities: Yes Meal Prep Responsibility: Primary  Laundry Responsibility: Primary Cleaning Responsibility: Primary Bill Paying/Finance Responsibility: Secondary Shopping Responsibility: Secondary Homemaking Comments:  (cooking, cleaning, making beds.dusting) Current License: Yes Mode of Transportation: Car Leisure and Hobbies:  (watch tv, cook) Prior Function Able to Take Stairs?: Yes Driving: Yes Vocation: Retired Leisure: Hobbies-yes (Comment) (tv) ADL ADL Grooming: Setup Where Assessed-Grooming: Wheelchair Upper Body Bathing: Minimal  assistance Where Assessed-Upper Body Bathing: Sitting at sink;Wheelchair Lower Body Bathing: Minimal assistance Where Assessed-Lower Body Bathing: Sitting at sink;Wheelchair Upper Body Dressing: Minimal assistance Where Assessed-Upper Body Dressing: Wheelchair Lower Body Dressing: Minimal assistance Toileting: Maximal assistance Where Assessed-Toileting: Glass blower/designer: Psychiatric nurse Method: Arts development officer: Energy manager: Not assessed Vision/Perception  Vision - History Baseline Vision: Wears contacts Patient Visual Report: No change from baseline Perception Perception: Within Functional Limits  Cognition Overall Cognitive Status: Appears within functional limits for tasks assessed Arousal/Alertness: Awake/alert Orientation Level: Oriented X4 Attention: Sustained Sustained Attention: Impaired Memory: Impaired Memory Impairment: Storage deficit;Retrieval deficit;Decreased recall of new information;Decreased short term memory;Prospective memory Decreased Short Term Memory: Verbal basic;Functional basic Awareness: Impaired Awareness Impairment: Intellectual impairment;Emergent impairment;Anticipatory impairment Problem Solving: Impaired Sensation Sensation Light Touch: Appears Intact Hot/Cold: Not tested Coordination Gross Motor Movements are Fluid and Coordinated: No Fine Motor Movements are Fluid and Coordinated: Yes Coordination and Movement Description: decreased LUE movements dur to ICD Motor  Motor Motor: Other (comment);Abnormal postural alignment and control Motor - Skilled Clinical Observations: Generalized weakness, premorbid thoracic kyphosis Mobility  Transfers Sit to Stand: 4: Min assist;With upper extremity assist;With armrests;From chair/3-in-1 Stand to Sit: 4: Min assist;With upper extremity assist  Trunk/Postural Assessment  Cervical Assessment Cervical Assessment: Within Functional  Limits Thoracic Assessment Thoracic Assessment: Exceptions to [facility] Thoracic AROM Overall Thoracic AROM: Deficits Lumbar Assessment Lumbar Assessment: Exceptions to [facility] Lumbar AROM Overall Lumbar AROM: Deficits Postural Control Postural Control: Deficits on evaluation  Balance Balance Balance Assessed: Yes Static Sitting Balance Static Sitting - Balance Support: Feet supported Static Sitting - Level of Assistance: 6: Modified independent (Device/Increase time) Dynamic Sitting Balance Dynamic Sitting - Balance Support: Right upper extremity supported;Left upper extremity supported;Feet supported Dynamic Sitting - Level of Assistance: 5: Stand by assistance Dynamic Sitting - Balance Activities: Reaching for objects;Forward lean/weight shifting;Lateral lean/weight shifting Static Standing Balance Static Standing - Balance Support: Right upper extremity supported;During functional activity Static Standing - Level of Assistance: 4: Min assist Extremity/Trunk Assessment RUE Assessment RUE Assessment: Within Functional Limits LUE Assessment LUE Assessment: Exceptions to WFL LUE AROM (degrees) LUE Overall AROM Comments: shou. flex 0-30 degrees  with limitations due to ICD  FIM:  FIM - Grooming Grooming Steps: Wash, rinse, dry face;Wash, rinse, dry hands;Oral care, brush teeth, clean dentures;Brush, comb hair Grooming: 4: Steadying assist  or patient completes 3 of 4 or 4 of 5 steps FIM - Bathing Bathing Steps Patient Completed: Chest;Right Arm;Left Arm;Abdomen;Front perineal area;Buttocks;Right upper leg;Left upper leg Bathing: 4: Min-Patient completes 8-9 6f 10 parts or 75+ percent FIM - Upper Body Dressing/Undressing Upper body dressing/undressing steps patient completed: Thread/unthread right sleeve of pullover shirt/dresss;Thread/unthread left sleeve of pullover shirt/dress;Put head through opening of pull over shirt/dress;Pull shirt over trunk Upper body dressing/undressing: 4:  Min-Patient completed 75 plus % of tasks FIM - Lower Body Dressing/Undressing Lower body dressing/undressing steps patient completed: Thread/unthread left pants leg;Pull pants up/down;Thread/unthread right pants leg Lower body dressing/undressing: 3: Mod-Patient completed 50-74% of tasks FIM - Toileting Toileting steps completed by patient: Performs perineal hygiene Toileting: 2: Max-Patient completed 1 of 3 steps  FIM - Radio producer Devices: Grab bars Toilet Transfers: 3-To toilet/BSC: Mod A (lift or lower assist);3-From toilet/BSC: Mod A (lift or lower assist) FIM - Tub/Shower Transfers Tub/shower Transfers: 0-Activity did not occur or was simulated   Refer to Care Plan for Long Term Goals  Recommendations for other services: None  Discharge Criteria: Patient will be discharged from OT if patient refuses treatment 3 consecutive times without medical reason, if treatment goals not met, if there is a change in medical status, if patient makes no progress towards goals or if patient is discharged from hospital.  The above assessment, treatment plan, treatment alternatives and goals were discussed and mutually agreed upon: by patient  Lisa Roca 06/10/2012, 1:26 PM

## 2012-06-10 NOTE — Evaluation (Signed)
Physical Therapy Assessment and Plan  Patient Details  Name: [name] MRN: [id number] Date of Birth: 07/08/1940  PT Diagnosis: Difficulty walking and Muscle weakness Rehab Potential: Excellent ELOS: 7-10 days   Today's Date: 06/10/2012 Time: 0805-0905 Time Calculation (min): 60 min  Problem List:  Patient Active Problem List  Diagnosis  . VITAMIN B12 DEFICIENCY  . VITAMIN D DEFICIENCY  . HYPERLIPIDEMIA  . UNSPECIFIED IRON DEFICIENCY ANEMIA  . Essential hypertension, benign  . Atrial fibrillation  . CHOLELITHIASIS  . Celiac disease  . Mitral valve disease  . Encounter for long-term (current) use of anticoagulants  . Carcinoma in situ of vulva  . Fibroid  . Ovarian cyst, left  . Osteoporosis, idiopathic  . Type II or unspecified type diabetes mellitus without mention of complication, uncontrolled  . GERD (gastroesophageal reflux disease)  . Ischemic cardiomyopathy  . Back pain, thoracic  . Urinary frequency  . Bruit  . 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 Hypomagnesemia  . Ventricular fibrillation  . Acute respiratory failure  . Warfarin-induced coagulopathy  . Acute encephalopathy  . Metabolic acidosis  . Sustained ventricular fibrillation  . Sudden cardiac arrest  . Acute on chronic systolic heart failure  . Atelectasis    Past Medical History:  Past Medical History  Diagnosis Date  . Anemia   . Atrial fibrillation     A.fib/flutter: s/p MAZE 2008, DCCV 2011, on coumadin; previously on flecainide, but dc'd due to worsening EF. Tikosyn discontinued 06/2012 after cardiac arrest.  . Diverticulitis   . CVA (cerebral vascular accident)     2008 - felt due to oscillating calcium on aortic valve  . Carcinoma in situ of vulva   . Fibroid   . Ovarian cyst, left 2008-2009  . Osteoporosis   . Diabetes mellitus   . Hypertension   . GERD (gastroesophageal reflux disease)   . Chronic systolic CHF (congestive heart  failure)     EF 35%  . Ischemic cardiomyopathy     EF 35%  . Coronary artery disease     Occluded LM 2/2 previous aortic root surgery; s/p 2v CABG 2010 LIMA to LAD, SVG to OM; Cath 03/11/12 patent SVG to OM, atretic LIMA to LAD, patent RCA, occluded native LM, EF 35%   . Arthritis     OSTEO  . Fracture of lumbar spine   . 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  . Superficial venous thrombosis of upper extremity     06/2012 - LUE  . Severe aortic stenosis     s/p homograft aortic root replacement 2008  . Mitral regurgitation     s/p mitral valve repair 2008  . Thoracic aortic aneurysm     s/p resection and grafting 2008   Past Surgical History:  Past Surgical History  Procedure Laterality Date  . Foot surgery      removed neuroma  . Aortic root replacement  2008    homograft  . Mv repair  2008    due to severe MR  . Resection and grafting of ascending thoracic aortic    . Aneurysm and left sided maze  2008  . Coronary artery bypass graft  4/10    LIMA to LAD, SVG to OM  . Wide excision of vulva      CA INSITU    Assessment & Plan  Clinical Impression: Patient is a 72 y.o. right-handed female with history of atrial fibrillation on chronic Coumadin therapy, CVA 2008, severe aortic stenosis and mitral regurgitation, chronic systolic congestive heart failure and ischemic cardiomyopathy. Admitted 05/18/2012 after becoming unresponsive while riding in the car. On arrival to the ED was found to be in VF arrest. Team estimated 45 minutes of ACLS. INR upon admission of greater than 5.00 and did receive fresh frozen plasma. Cranial CT scan showed no acute abnormalities. Question aspiration pneumonia and placed on Unasyn. Cardiology services consulted maintained on Levophed for pressure support. Troponin less than 0.30. EKG demonstrating  wide QRS with prolonged QT. Patient maintained on ventilatory support. Upper extremity Dopplers 05/22/2012 did show findings consistent with superficial vein thrombosis left cephalic vein left basilic vein. Patient currently maintained on intravenous heparin as well as Coumadin therapy resumed. She remained intubated from 05/18/2012 at 05/23/2012. Persistent atrial fibrillation as well as bradycardia and underwent placement of ICD 06/08/2012 per Dr. Rayann Heman. Patient with chronic anemia 7.4 to 7.9 and monitored. Her diet was slowly advanced to a regular consistency.  Patient transferred to CIR on 06/09/2012 .   Patient currently requires mod with mobility secondary to muscle weakness, decreased cardiorespiratoy endurance and decreased standing balance and decreased balance strategies.  Prior to hospitalization, patient was independent  with mobility and lived with Spouse in a House home.  Home access is 2Stairs to enter.  Patient will benefit from skilled PT intervention to maximize safe functional mobility and minimize fall risk for planned discharge home with intermittent assist.  Anticipate patient will outpatient PT vs. Cardiac rehab at discharge.  PT - End of Session Activity Tolerance: Tolerates 10 - 20 min activity with multiple rests Endurance Deficit: Yes Endurance Deficit Description: cardiac impairments PT Assessment Rehab Potential: Excellent Barriers to Discharge: None PT Plan PT Intensity: Minimum of 1-2 x/day ,45 to 90 minutes PT Frequency: 5 out of 7 days PT Duration Estimated Length of Stay: 7-10 days PT Treatment/Interventions: Ambulation/gait training;Balance/vestibular training;Community reintegration;Discharge planning;Disease management/prevention;DME/adaptive equipment instruction;Functional mobility training;Neuromuscular re-education;Patient/family education;Stair training;Therapeutic Activities;Therapeutic Exercise;UE/LE Strength taining/ROM;UE/LE Coordination activities PT  Recommendation Follow Up Recommendations: Outpatient PT;Other (comment) (vs. Cardiac rehab) Patient destination: Home Equipment Details: RW vs. rollator for longer community distances?  Skilled Therapeutic Intervention Patient able to verbalize LUE ROM restrictions secondary to ICD placement.  Skilled therapy intervention initiated with transfers w/c <> toilet with stand pivot and mod HHA; patient able to stand at toilet and at sink for prolonged period of time with one >> no UE support to perform hygiene without symptoms of SOB and minor changes in HR.  Patient does present with intermittent episodes of posterior LOB.   PT Evaluation Precautions/Restrictions Precautions Precautions: Fall;ICD/Pacemaker Precaution Comments: LUE DVT, on heparin, thoracic and lumbar compression fractures, AROM restrictions secondary to ICD placement (no shoulder flexion to 90 until Monday) General Chart Reviewed: Yes Response to Previous Treatment: Not applicable Family/Caregiver Present: Yes Vital SignsTherapy Vitals Temp: 98.3 F (36.8 C) Temp src: Oral Pulse Rate: 85 Resp: 18 BP: 121/69 mmHg Patient Position, if appropriate: Sitting Oxygen Therapy SpO2: 96 % O2 Device: None (Room air) Pain Pain Assessment Pain Assessment: No/denies pain Home Living/Prior Functioning Home Living Lives With: Spouse Available Help at Discharge: Family;Available 24 hours/day Type of Home: House Home Access: Stairs to enter [employer] of Steps: 2 Entrance Stairs-Rails: None Home Layout: One level Home Adaptive Equipment: None Prior Function Able to Take Stairs?: Yes Driving: Yes Vocation: Retired Radiographer, therapeutic - History Baseline Vision:  Wears contacts Patient Visual Report: No change from baseline Vision - Assessment Eye Alignment: Within Functional Limits Perception Perception: Not tested Praxis Praxis: Not tested  Cognition Overall Cognitive Status: Appears within  functional limits for tasks assessed Arousal/Alertness: Awake/alert Orientation Level: Oriented X4 Sensation Sensation Light Touch: Appears Intact Stereognosis: Not tested Hot/Cold: Not tested Proprioception: Appears Intact Coordination Gross Motor Movements are Fluid and Coordinated: Yes Fine Motor Movements are Fluid and Coordinated: Yes Motor  Motor Motor: Other (comment);Abnormal postural alignment and control Motor - Skilled Clinical Observations: Generalized weakness, premorbid thoracic kyphosis  Mobility Bed Mobility Supine to Sit: 5: Supervision;HOB flat Transfers Stand Pivot Transfers: 3: Mod assist Stand Pivot Transfer Details (indicate cue type and reason): Unable to use LUE to assist with sit <> stand; required mod A for lifting from bed and toilet secondary to LE weakness and assistance to stabilize upon standing secondary to mild posterior LOB.   Locomotion  Ambulation Ambulation: Yes Ambulation/Gait Assistance: 3: Mod assist Ambulation Distance (Feet): 114 Feet Assistive device: 1 person hand held assist Ambulation/Gait Assistance Details: Performed gait in controlled environment in open spaces and in smaller spaces (bathroom) with floor surface changes with mod HHA secondary to intermittent posterior or lateral LOB; patient keeps weight posterior on heels Gait Gait Pattern: Step-through pattern;Decreased stride length High Level Ambulation High Level Ambulation: Other high level ambulation High Level Ambulation - Other Comments: Performed 2 minute walk test without use of AD: 114' in 2 minutes with Sp02: 96% and HR: 97 bpm with decreased velocity (normative value for older adults 150'). Stairs / Additional Locomotion Stairs: Yes Stairs Assistance: 3: Mod assist Stairs Assistance Details (indicate cue type and reason): Required mod A to negotiate 5 stairs with bilat UE support on rails secondary to LE weakness and imbalance; required lifting assistance to fully  advance COG forwards to next step Stair Management Technique: Two rails;Step to pattern;Forwards Number of Stairs: 5 Height of Stairs: 6 Wheelchair Mobility Wheelchair Mobility: No (secondary to LUE AROM restrictions) Distance: 150 (LUE restricted use after ICD)  Trunk/Postural Assessment  Cervical Assessment Cervical Assessment: Within Functional Limits Thoracic Assessment Thoracic Assessment: Exceptions to [facility] (premorbid kyphosis) Lumbar Assessment Lumbar Assessment: Within Functional Limits Postural Control Postural Control: Deficits on evaluation (in standing keeps weight posterior)  Balance Static Sitting Balance Static Sitting - Balance Support: Feet supported Static Sitting - Level of Assistance: 6: Modified independent (Device/Increase time) Dynamic Sitting Balance Dynamic Sitting - Balance Support: Right upper extremity supported;Left upper extremity supported;Feet supported Dynamic Sitting - Level of Assistance: 6: Modified independent (Device/Increase time) Static Standing Balance Static Standing - Balance Support: Right upper extremity supported;During functional activity Static Standing - Level of Assistance: 4: Min assist Dynamic Standing Balance Dynamic Standing - Balance Support: Right upper extremity supported;During functional activity Dynamic Standing - Level of Assistance: 3: Mod assist Extremity Assessment  RLE Assessment RLE Assessment: Exceptions to [facility] RLE Strength RLE Overall Strength: Deficits RLE Overall Strength Comments: Generalized weakness: 3/5 hip flexion, 4/5 knee flexion, extension and ankle DF LLE Assessment LLE Assessment: Exceptions to WFL LLE Strength LLE Overall Strength: Deficits LLE Overall Strength Comments: Generalized weakness: 3/5 hip flexion, 4/5 knee flexion, extension and ankle DF  FIM:  FIM - Bed/Chair Transfer Bed/Chair Transfer: 5: Supine > Sit: Supervision (verbal cues/safety issues);3: Bed > Chair or W/C: Mod A (lift or  lower assist);3: Chair or W/C > Bed: Mod A (lift or lower assist) FIM - Locomotion: Wheelchair Distance: 150 (LUE restricted use after ICD) Locomotion: Wheelchair: 1:  Total Assistance/staff pushes wheelchair (Pt<25%) FIM - Locomotion: Ambulation Locomotion: Ambulation Assistive Devices: Other (comment) (R HHA) Ambulation/Gait Assistance: 3: Mod assist Locomotion: Ambulation: 2: Travels 50 - 149 ft with moderate assistance (Pt: 50 - 74%) FIM - Locomotion: Stairs Locomotion: Scientist, physiological: Hand rail - 2 Locomotion: Stairs: 2: Up and Down 4 - 11 stairs with moderate assistance (Pt: 50 - 74%)   Refer to Care Plan for Long Term Goals  Recommendations for other services: None  Discharge Criteria: Patient will be discharged from PT if patient refuses treatment 3 consecutive times without medical reason, if treatment goals not met, if there is a change in medical status, if patient makes no progress towards goals or if patient is discharged from hospital.  The above assessment, treatment plan, treatment alternatives and goals were discussed and mutually agreed upon: by patient and by family  Malachy Mood 06/10/2012, 9:23 AM

## 2012-06-10 NOTE — Progress Notes (Signed)
Patient arrived at 0905 to room 4037 by wheelchair with NT. Patient alert and orient x 4 with not complaints of pain. Vitals signs stable. Patient was oriented to the unit, call bell within reach and bed alarm enable.

## 2012-06-10 NOTE — Progress Notes (Signed)
Patient ID: [name], female   DOB: 1940-12-17, 72 y.o.   MRN: [id number] 72 y.o. right-handed female with history of atrial fibrillation on chronic Coumadin therapy, CVA 2008, severe aortic stenosis and mitral regurgitation, chronic systolic congestive heart failure and ischemic cardiomyopathy. Admitted 05/18/2012 after becoming unresponsive while riding in the car. On arrival to the ED was found to be in VF arrest. Team estimated 45 minutes of ACLS.  INR upon admission of greater than 5.00 and did receive fresh frozen plasma. Cranial CT scan showed no acute abnormalities. Question aspiration pneumonia and placed on Unasyn. Cardiology services consulted maintained on Levophed for pressure support. Troponin less than 0.30. EKG demonstrating wide QRS with prolonged QT. Patient maintained on ventilatory support. Upper extremity Dopplers 05/22/2012 did show findings consistent with superficial vein thrombosis left cephalic vein left basilic vein. Patient currently maintained on intravenous heparin as well as Coumadin therapy resumed. She remained intubated from 05/18/2012 at 05/23/2012. Persistent atrial fibrillation as well as bradycardia and underwent placement of ICD 06/08/2012 per Dr. Rayann Heman. Patient with chronic anemia 7.4 to 7.9 and monitored Subjective/Complaints: Bowels moved yesterday, no c/os.  Remembers Left shoulder precautions Review of Systems  Musculoskeletal: Positive for joint pain.       Left shoulder operative site   Objective: Vital Signs: Blood pressure 121/69, pulse 85, temperature 98.3 F (36.8 C), temperature source Oral, resp. rate 18, weight 68.9 kg (151 lb 14.4 oz), SpO2 96.00%. Dg Chest 2 View  06/09/2012  *RADIOLOGY REPORT*  Clinical Data: Postop from defibrillator placement.  Left arm pain.  CHEST - 2 VIEW  Comparison: 06/02/2012  Findings: A new dual lead AICD is seen with leads within the right atrium and right pedicle.  No evidence of pneumothorax.  Cardiomegaly and  pulmonary vascular congestion remain stable. There is minimal atelectasis at the left lung base.  No evidence of pulmonary edema or consolidation.  Prior CABG and mitral valve replacement again noted.  IMPRESSION:  1.  AICD in appropriate position.  No evidence of pneumothorax. 2.  Mild left basilar atelectasis. 3.  Stable cardiomegaly and pulmonary vascular congestion.   Original Report Authenticated By: Earle Gell, M.D.    Results for orders placed during the hospital encounter of 06/09/12 (from the past 72 hour(s))  GLUCOSE, CAPILLARY     Status: Abnormal   Collection Time    06/10/12  6:06 AM      Result Value Range   Glucose-Capillary 127 (*) 70 - 99 mg/dL  PROTIME-INR     Status: Abnormal   Collection Time    06/10/12  6:20 AM      Result Value Range   Prothrombin Time 21.5 (*) 11.6 - 15.2 seconds   INR 1.95 (*) 0.00 - 1.49  GLUCOSE, CAPILLARY     Status: Abnormal   Collection Time    06/10/12  7:27 AM      Result Value Range   Glucose-Capillary 132 (*) 70 - 99 mg/dL     HEENT: normal Cardio: RRR and no murmur Resp: CTA B/L and unlabored GI: BS positive and non tender Extremity:  No Edema Skin:   Intact and Wound C/D/I Neuro: Alert/Oriented, Normal Sensory and Abnormal Motor 3-/5 Left delt due to restrictions otherwise 4/5 Musc/Skel:  Extremity tender mild tenderness Left shoulder Gen NAD   Assessment/Plan: 1. Functional deficits secondary to Deconditioning related to out of hospital V fib arrest which require 3+ hours per day of interdisciplinary therapy in a comprehensive inpatient rehab setting. Physiatrist is  providing close team supervision and 24 hour management of active medical problems listed below. Physiatrist and rehab team continue to assess barriers to discharge/monitor patient progress toward functional and medical goals. FIM:       FIM - Toileting Toileting steps completed by patient: Performs perineal hygiene Toileting: 2: Max-Patient completed 1 of 3  steps  FIM - Radio producer Devices: Grab bars Toilet Transfers: 3-To toilet/BSC: Mod A (lift or lower assist);3-From toilet/BSC: Mod A (lift or lower assist)  FIM - Bed/Chair Transfer Bed/Chair Transfer: 5: Supine > Sit: Supervision (verbal cues/safety issues);3: Bed > Chair or W/C: Mod A (lift or lower assist);3: Chair or W/C > Bed: Mod A (lift or lower assist)  FIM - Locomotion: Wheelchair Distance: 150 (LUE restricted use after ICD) Locomotion: Wheelchair: 1: Total Assistance/staff pushes wheelchair (Pt<25%) FIM - Locomotion: Ambulation Locomotion: Ambulation Assistive Devices: Other (comment) (R HHA) Ambulation/Gait Assistance: 3: Mod assist Locomotion: Ambulation: 2: Travels 50 - 149 ft with moderate assistance (Pt: 50 - 74%)  Comprehension Comprehension Mode: Auditory Comprehension: 7-Follows complex conversation/direction: With no assist  Expression Expression Mode: Verbal Expression: 7-Expresses complex ideas: With no assist  Social Interaction Social Interaction: 7-Interacts appropriately with others - No medications needed.        Medical Problem List and Plan:  1. deconditioning after cardiac arrest status post ICD placement, respiratory failure, multi-medical issues  2. DVT Prophylaxis/Anticoagulation: Chronic Coumadin therapy. INR 1.95 06/09/2012  3. Pain Management: tylenol,hydrocodone as needed.  -pain under fair control at present.  4. Neuropsych: This patient is capable of making decisions on her own behalf.  5. Atrial fibrillation/bradycardia. Status post ICD placement 06/09/2012. Amiodarone 200 mg daily, coreg 6.25 mg twice a day, Cozaar 25 mg daily. Monitor rates with increased physical activity.  6. Diabetes mellitus with peripheral neuropathy. Latest hemoglobin A1c 6.2. Lantus insulin 10 units daily. Check blood sugars a.c. and at bedtime. Patient on Glucophage 500 mg twice a day prior to admission  -adjust regimen as needed.   7. Chronic anemia 7.9 and monitored.  8. Constipation: received enema today. Continue work on evacuation and then regular maintenance regimen  9. GERD: protonix   LOS (Days) 1 A FACE TO FACE EVALUATION WAS PERFORMED  [name] E 06/10/2012, 9:46 AM

## 2012-06-10 NOTE — Progress Notes (Signed)
ANTICOAGULATION CONSULT NOTE - Follow Up Consult  Pharmacy Consult for Warfarin Indication: Hx Afib/DVT/mechanical AVR  No Known Allergies  Patient Measurements: Weight: 151 lb 14.4 oz (68.9 kg)  Vital Signs: Temp: 98.3 F (36.8 C) (04/05 0620) Temp src: Oral (04/05 0620) BP: 121/69 mmHg (04/05 0912) Pulse Rate: 85 (04/05 0912)  Labs:  Recent Labs  06/08/12 0545 06/09/12 0538 06/10/12 0620  HGB 10.3* 9.8*  --   HCT 32.5* 31.8*  --   PLT 409* 368  --   LABPROT 22.2* 21.5* 21.5*  INR 2.04* 1.95* 1.95*  CREATININE 0.97 0.99  --     The CrCl is unknown because both a height and weight (above a minimum accepted value) are required for this calculation.   Assessment: 72 y.o. F who continues on warfarin for anticoagulation in the setting of hx Afib/DVT/mechanical AVR with a slightly SUBtherapeutic INR this a.m (INR 1.95, goal of 2-3). Hgb/Hct slight drop on 4/4 -- no overt s/sx of bleeding noted.  Goal of Therapy:  INR 2-3   Plan:  1. Warfarin 3 mg x 1 dose at 1800 today 2. Will continue to monitor for any signs/symptoms of bleeding and will follow up with PT/INR in the a.m.   Alycia Rossetti, PharmD, BCPS Clinical Pharmacist Pager: [phone] 06/10/2012 2:48 PM

## 2012-06-10 NOTE — Progress Notes (Signed)
INITIAL NUTRITION ASSESSMENT  DOCUMENTATION CODES Per approved criteria  -Not Applicable   INTERVENTION: 1. Continue Ensure Complete po BID, each supplement provides 350 kcal and 13 grams of protein.  2. Encouraged pt to order Magic Cup with meals  3. RD will continue to follow    NUTRITION DIAGNOSIS: Inadequate oral intake related to poor appetite as evidenced by 40-50% meal completion.   Goal: PO intake to meet >/=90% estimated nutrition needs  Monitor:  PO intake, weight trends, labs  Reason for Assessment: Malnutrition Screening Tool  72 y.o. female  Admitting Dx: VF arrest   ASSESSMENT: Pt admitted from [facility] after VF arrest in car, requiring intubation and Enteral Nutrition. Pt self extubated, post extubation diet was able to advance but pt with poor appetite.  Pt with poor appetite prior to acute admission per husband. Started on nutrition supplements.  S/p ICD placement.   Pt states she continues with poor appetite, only food from tray she reports eating is melon. 40-50% meal completion recorded. Continues to drink Ensure shakes, but does not want other supplements.    Height: Ht Readings from Last 1 Encounters:  05/18/12 5\' 6"  (1.676 m)    Weight: Wt Readings from Last 1 Encounters:  06/10/12 151 lb 14.4 oz (68.9 kg)    Ideal Body Weight: 130 lbs   % Ideal Body Weight: 139 %  Wt Readings from Last 10 Encounters:  06/10/12 151 lb 14.4 oz (68.9 kg)  06/09/12 159 lb 12.8 oz (72.485 kg)  06/09/12 159 lb 12.8 oz (72.485 kg)  04/21/12 165 lb 8 oz (75.07 kg)  04/05/12 166 lb (75.297 kg)  04/03/12 165 lb (74.844 kg)  04/03/12 165 lb (74.844 kg)  03/31/12 168 lb 12.8 oz (76.567 kg)  03/21/12 172 lb (78.019 kg)  03/21/12 172 lb (78.019 kg)    Usual Body Weight: 170 lbs   % Usual Body Weight: 89%  BMI:  Body mass index is 24.53 kg/(m^2). WNL   Estimated Nutritional Needs: Kcal: 1550-1750 Protein: 65-75 gm  Fluid: 1.6-1.8 L   Skin: intact   Diet  Order: Carb Control  EDUCATION NEEDS: -No education needs identified at this time   Intake/Output Summary (Last 24 hours) at 06/10/12 1043 Last data filed at 06/10/12 0831  Gross per 24 hour  Intake    240 ml  Output      0 ml  Net    240 ml    Last BM: 4/5    Labs:   Recent Labs Lab 06/07/12 0535 06/08/12 0545 06/09/12 0538  NA 138 139 138  K 3.9 4.2 4.0  CL 98 99 100  CO2 32 32 30  BUN 28* 30* 27*  CREATININE 0.91 0.97 0.99  CALCIUM 8.9 9.0 8.6  GLUCOSE 135* 143* 124*    CBG (last 3)   Recent Labs  06/09/12 1657 06/10/12 0606 06/10/12 0727  GLUCAP 131* 127* 132*    Scheduled Meds: . amiodarone  200 mg Oral Daily  . carvedilol  6.25 mg Oral BID WC  . feeding supplement  237 mL Oral BID BM  . insulin aspart  0-9 Units Subcutaneous TID WC  . insulin glargine  10 Units Subcutaneous Q24H  . losartan  25 mg Oral Daily  . pantoprazole  40 mg Oral Daily  . polyethylene glycol  17 g Oral Daily  . Warfarin - Pharmacist Dosing Inpatient   Does not apply q1800    Continuous Infusions:   Past Medical History  Diagnosis Date  .  Anemia   . Atrial fibrillation     A.fib/flutter: s/p MAZE 2008, DCCV 2011, on coumadin; previously on flecainide, but dc'd due to worsening EF. Tikosyn discontinued 06/2012 after cardiac arrest.  . Diverticulitis   . CVA (cerebral vascular accident)     2008 - felt due to oscillating calcium on aortic valve  . Carcinoma in situ of vulva   . Fibroid   . Ovarian cyst, left 2008-2009  . Osteoporosis   . Diabetes mellitus   . Hypertension   . GERD (gastroesophageal reflux disease)   . Chronic systolic CHF (congestive heart failure)     EF 35%  . Ischemic cardiomyopathy     EF 35%  . Coronary artery disease     Occluded LM 2/2 previous aortic root surgery; s/p 2v CABG 2010 LIMA to LAD, SVG to OM; Cath 03/11/12 patent SVG to OM, atretic LIMA to LAD, patent RCA, occluded native LM, EF 35%   . Arthritis     OSTEO  . Fracture of lumbar  spine   . 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  . Superficial venous thrombosis of upper extremity     06/2012 - LUE  . Severe aortic stenosis     s/p homograft aortic root replacement 2008  . Mitral regurgitation     s/p mitral valve repair 2008  . Thoracic aortic aneurysm     s/p resection and grafting 2008    Past Surgical History  Procedure Laterality Date  . Foot surgery      removed neuroma  . Aortic root replacement  2008    homograft  . Mv repair  2008    due to severe MR  . Resection and grafting of ascending thoracic aortic    . Aneurysm and left sided maze  2008  . Coronary artery bypass graft  4/10    LIMA to LAD, SVG to OM  . Wide excision of vulva      [facility]    Orson Slick RD, LDN Pager [phone] After Hours pager [phone]

## 2012-06-11 ENCOUNTER — Inpatient Hospital Stay (HOSPITAL_COMMUNITY): Payer: Medicare Other | Admitting: Physical Therapy

## 2012-06-11 LAB — GLUCOSE, CAPILLARY
Glucose-Capillary: 133 mg/dL — ABNORMAL HIGH (ref 70–99)
Glucose-Capillary: 120 mg/dL — ABNORMAL HIGH (ref 70–99)
Glucose-Capillary: 130 mg/dL — ABNORMAL HIGH (ref 70–99)

## 2012-06-11 LAB — PROTIME-INR
INR: 2.15 — ABNORMAL HIGH (ref 0.00–1.49)
Prothrombin Time: 23.1 seconds — ABNORMAL HIGH (ref 11.6–15.2)

## 2012-06-11 MED ORDER — WARFARIN SODIUM 3 MG PO TABS
3.0000 mg | ORAL_TABLET | Freq: Once | ORAL | Status: AC
  Administered 2012-06-11: 3 mg via ORAL
  Filled 2012-06-11: qty 1

## 2012-06-11 NOTE — Progress Notes (Signed)
ANTICOAGULATION CONSULT NOTE - Follow Up Consult  Pharmacy Consult for Warfarin Indication: Hx Afib/DVT/mechanical AVR  No Known Allergies  Patient Measurements: Weight: 152 lb 8.9 oz (69.2 kg)  Vital Signs: Temp: 98.4 F (36.9 C) (04/06 0500) Temp src: Oral (04/06 0500) BP: 117/50 mmHg (04/06 0500) Pulse Rate: 66 (04/06 0500)  Labs:  Recent Labs  06/09/12 0538 06/10/12 0620 06/12/2012 0600  HGB 9.8*  --   --   HCT 31.8*  --   --   PLT 368  --   --   LABPROT 21.5* 21.5* 23.1*  INR 1.95* 1.95* 2.15*  CREATININE 0.99  --   --     The CrCl is unknown because both a height and weight (above a minimum accepted value) are required for this calculation.   Assessment: 72 y.o. F who continues on warfarin for anticoagulation in the setting of hx Afib/DVT/mechanical AVR with a therapeutic INR this a.m (INR 2.15, goal of 2-3). Hgb/Hct slight drop on 4/4, no CBC on 4/5 or 4/6 -- no overt s/sx of bleeding noted.  Goal of Therapy:  INR 2-3   Plan:  1. Warfarin 3 mg x 1 dose at 1800 today 2. Will continue to monitor for any signs/symptoms of bleeding and will follow up with PT/INR in the a.m.   Alycia Rossetti, PharmD, BCPS Clinical Pharmacist Pager: [phone] 06/10/2012 9:42 AM

## 2012-06-11 NOTE — Progress Notes (Signed)
Physical Therapy Note  Patient Details  Name: [name] MRN: [id number] Date of Birth: 12-26-1940 Today's Date: 06/23/2012  [id number] (45 minutes) individual Pain: no reported pain Other: Oxygen sats 94% RA, pulse 84 BPM (resting) Focus of treatment: Nustep Level 4 X 10 minutes (no significant change in Oxygen sats,pulse); sit to stand from mat  ; Berg balance test 40/56 ; sit to stand from mat X 5 (unable to perform without use of hands); gait 160 feet RW SBA X 1.   Dianelys Scinto,JIM 06/18/2012, 9:58 AM

## 2012-06-11 NOTE — Progress Notes (Signed)
Patient ID: [name], female   DOB: 1940/03/11, 72 y.o.   MRN: [id number] 72 y.o. right-handed female with history of atrial fibrillation on chronic Coumadin therapy, CVA 2008, severe aortic stenosis and mitral regurgitation, chronic systolic congestive heart failure and ischemic cardiomyopathy. Admitted 05/18/2012 after becoming unresponsive while riding in the car. On arrival to the ED was found to be in VF arrest. Team estimated 45 minutes of ACLS.  INR upon admission of greater than 5.00 and did receive fresh frozen plasma. Cranial CT scan showed no acute abnormalities. Question aspiration pneumonia and placed on Unasyn. Cardiology services consulted maintained on Levophed for pressure support. Troponin less than 0.30. EKG demonstrating wide QRS with prolonged QT. Patient maintained on ventilatory support. Upper extremity Dopplers 05/22/2012 did show findings consistent with superficial vein thrombosis left cephalic vein left basilic vein. Patient currently maintained on intravenous heparin as well as Coumadin therapy resumed. She remained intubated from 05/18/2012 at 05/23/2012. Persistent atrial fibrillation as well as bradycardia and underwent placement of ICD 06/08/2012 per Dr. Rayann Heman. Patient with chronic anemia 7.4 to 7.9 and monitored Subjective/Complaints: No cp no SOB, pt drinking and eating OK Would like IV out  Review of Systems  Musculoskeletal: Positive for joint pain.       Left shoulder operative site   Objective: Vital Signs: Blood pressure 117/50, pulse 66, temperature 98.4 F (36.9 C), temperature source Oral, resp. rate 17, weight 69.2 kg (152 lb 8.9 oz), SpO2 95.00%. Dg Chest 2 View  06/09/2012  *RADIOLOGY REPORT*  Clinical Data: Postop from defibrillator placement.  Left arm pain.  CHEST - 2 VIEW  Comparison: 06/02/2012  Findings: A new dual lead AICD is seen with leads within the right atrium and right pedicle.  No evidence of pneumothorax.  Cardiomegaly and pulmonary  vascular congestion remain stable. There is minimal atelectasis at the left lung base.  No evidence of pulmonary edema or consolidation.  Prior CABG and mitral valve replacement again noted.  IMPRESSION:  1.  AICD in appropriate position.  No evidence of pneumothorax. 2.  Mild left basilar atelectasis. 3.  Stable cardiomegaly and pulmonary vascular congestion.   Original Report Authenticated By: Earle Gell, M.D.    Results for orders placed during the hospital encounter of 06/09/12 (from the past 72 hour(s))  GLUCOSE, CAPILLARY     Status: Abnormal   Collection Time    06/10/12  6:06 AM      Result Value Range   Glucose-Capillary 127 (*) 70 - 99 mg/dL  PROTIME-INR     Status: Abnormal   Collection Time    06/10/12  6:20 AM      Result Value Range   Prothrombin Time 21.5 (*) 11.6 - 15.2 seconds   INR 1.95 (*) 0.00 - 1.49  GLUCOSE, CAPILLARY     Status: Abnormal   Collection Time    06/10/12  7:27 AM      Result Value Range   Glucose-Capillary 132 (*) 70 - 99 mg/dL  GLUCOSE, CAPILLARY     Status: Abnormal   Collection Time    06/10/12 12:01 PM      Result Value Range   Glucose-Capillary 187 (*) 70 - 99 mg/dL  PROTIME-INR     Status: Abnormal   Collection Time    06/21/2012  6:00 AM      Result Value Range   Prothrombin Time 23.1 (*) 11.6 - 15.2 seconds   INR 2.15 (*) 0.00 - 1.49     HEENT: normal Cardio:  RRR and no murmur Resp: CTA B/L and unlabored GI: BS positive and non tender Extremity:  No Edema Skin:   Intact and Wound C/D/I Neuro: Alert/Oriented, Normal Sensory and Abnormal Motor 3-/5 Left delt due to restrictions otherwise 4/5 Musc/Skel:  Extremity tender mild tenderness Left shoulder Gen NAD   Assessment/Plan: 1. Functional deficits secondary to Deconditioning related to out of hospital V fib arrest which require 3+ hours per day of interdisciplinary therapy in a comprehensive inpatient rehab setting. Physiatrist is providing close team supervision and 24 hour  management of active medical problems listed below. Physiatrist and rehab team continue to assess barriers to discharge/monitor patient progress toward functional and medical goals. FIM: FIM - Bathing Bathing Steps Patient Completed: Chest;Right Arm;Left Arm;Abdomen;Front perineal area;Buttocks;Right upper leg;Left upper leg Bathing: 4: Min-Patient completes 8-9 67f 10 parts or 75+ percent  FIM - Upper Body Dressing/Undressing Upper body dressing/undressing steps patient completed: Thread/unthread right sleeve of pullover shirt/dresss;Thread/unthread left sleeve of pullover shirt/dress;Put head through opening of pull over shirt/dress;Pull shirt over trunk Upper body dressing/undressing: 4: Min-Patient completed 75 plus % of tasks FIM - Lower Body Dressing/Undressing Lower body dressing/undressing steps patient completed: Thread/unthread left pants leg;Pull pants up/down;Thread/unthread right pants leg Lower body dressing/undressing: 3: Mod-Patient completed 50-74% of tasks  FIM - Toileting Toileting steps completed by patient: Performs perineal hygiene Toileting: 2: Max-Patient completed 1 of 3 steps  FIM - Radio producer Devices: Grab bars Toilet Transfers: 4-To toilet/BSC: Min A (steadying Pt. > 75%);4-From toilet/BSC: Min A (steadying Pt. > 75%)  FIM - Bed/Chair Transfer Bed/Chair Transfer: 5: Supine > Sit: Supervision (verbal cues/safety issues);3: Bed > Chair or W/C: Mod A (lift or lower assist);3: Chair or W/C > Bed: Mod A (lift or lower assist)  FIM - Locomotion: Wheelchair Distance: 150 (LUE restricted use after ICD) Locomotion: Wheelchair: 1: Total Assistance/staff pushes wheelchair (Pt<25%) FIM - Locomotion: Ambulation Locomotion: Ambulation Assistive Devices: Other (comment) (R HHA) Ambulation/Gait Assistance: 3: Mod assist Locomotion: Ambulation: 2: Travels 50 - 149 ft with moderate assistance (Pt: 50 - 74%)  Comprehension Comprehension Mode:  Auditory Comprehension: 5-Understands complex 90% of the time/Cues < 10% of the time  Expression Expression Mode: Verbal Expression: 7-Expresses complex ideas: With no assist  Social Interaction Social Interaction: 7-Interacts appropriately with others - No medications needed.  Problem Solving Problem Solving: 5-Solves basic problems: With no assist  Memory Memory: 5-Recognizes or recalls 90% of the time/requires cueing < 10% of the time  Medical Problem List and Plan:  1. deconditioning after cardiac arrest status post ICD placement, respiratory failure, multi-medical issues  2. DVT Prophylaxis/Anticoagulation: Chronic Coumadin therapy. INR 1.95 06/09/2012  3. Pain Management: tylenol,hydrocodone as needed.  -pain under fair control at present.  4. Neuropsych: This patient is capable of making decisions on her own behalf.  5. Atrial fibrillation/bradycardia. Status post ICD placement 06/09/2012. Amiodarone 200 mg daily, coreg 6.25 mg twice a day, Cozaar 25 mg daily. Monitor rates with increased physical activity.  6. Diabetes mellitus with peripheral neuropathy. Latest hemoglobin A1c 6.2. Lantus insulin 10 units daily. Check blood sugars a.c. and at bedtime. Patient on Glucophage 500 mg twice a day prior to admission  -adjust regimen as needed.  7. Chronic anemia 7.9 and monitored.  8. Constipation: received enema today. Continue work on evacuation and then regular maintenance regimen  9. GERD: protonix   LOS (Days) 2 A FACE TO FACE EVALUATION WAS PERFORMED  KIRSTEINS,ANDREW E 06/28/2012, 9:17 AM

## 2012-06-12 ENCOUNTER — Inpatient Hospital Stay (HOSPITAL_COMMUNITY): Payer: Medicare Other | Admitting: Physical Therapy

## 2012-06-12 ENCOUNTER — Telehealth: Payer: Self-pay

## 2012-06-12 ENCOUNTER — Inpatient Hospital Stay (HOSPITAL_COMMUNITY): Payer: Medicare Other

## 2012-06-12 DIAGNOSIS — R5381 Other malaise: Secondary | ICD-10-CM

## 2012-06-12 DIAGNOSIS — I4891 Unspecified atrial fibrillation: Secondary | ICD-10-CM

## 2012-06-12 DIAGNOSIS — I469 Cardiac arrest, cause unspecified: Secondary | ICD-10-CM

## 2012-06-12 LAB — GLUCOSE, CAPILLARY
Glucose-Capillary: 139 mg/dL — ABNORMAL HIGH (ref 70–99)
Glucose-Capillary: 131 mg/dL — ABNORMAL HIGH (ref 70–99)
Glucose-Capillary: 170 mg/dL — ABNORMAL HIGH (ref 70–99)

## 2012-06-12 LAB — COMPREHENSIVE METABOLIC PANEL
ALT: <5 U/L (ref 0–35)
AST: 15 U/L (ref 0–37)
CO2: 30 mEq/L (ref 19–32)
Calcium: 8.7 mg/dL (ref 8.4–10.5)
Chloride: 101 mEq/L (ref 96–112)
GFR calc non Af Amer: 68 mL/min — ABNORMAL LOW (ref >90)
Sodium: 139 mEq/L (ref 135–145)

## 2012-06-12 LAB — CBC WITH DIFFERENTIAL/PLATELET
Basophils Absolute: 0 10*3/uL (ref 0.0–0.1)
Eosinophils Relative: 3 % (ref 0–5)
Lymphocytes Relative: 9 % — ABNORMAL LOW (ref 12–46)
Neutro Abs: 6.1 10*3/uL (ref 1.7–7.7)
Platelets: 270 10*3/uL (ref 150–400)
RDW: 16.2 % — ABNORMAL HIGH (ref 11.5–15.5)
WBC: 7.5 10*3/uL (ref 4.0–10.5)

## 2012-06-12 MED ORDER — WARFARIN SODIUM 3 MG PO TABS
3.0000 mg | ORAL_TABLET | Freq: Once | ORAL | Status: AC
  Administered 2012-06-12: 3 mg via ORAL
  Filled 2012-06-12: qty 1

## 2012-06-12 MED ORDER — METFORMIN HCL 500 MG PO TABS
500.0000 mg | ORAL_TABLET | Freq: Two times a day (BID) | ORAL | Status: DC
  Administered 2012-06-12 – 2012-06-16 (×8): 500 mg via ORAL
  Filled 2012-06-12 (×10): qty 1

## 2012-06-12 NOTE — Progress Notes (Signed)
Occupational Therapy Session Note  Patient Details  Name: [name] MRN: [id number] Date of Birth: 1940/10/02  Today's Date: 06/12/2012 Time: 1105-1200 Time Calculation (min): 55 min SRT=LTG (mod I) secondary to ELOS Short Term Goals: Week 1:     Skilled Therapeutic Interventions/Progress Updates:    Pt engaged in bathing and dressing sit<>stand from w/c at sink.  Pt completed all tasks after setup including putting contacts in requiring supervision only except for steady A when standing to pull up pants.  Discussed LTGs with patient and discharge plans.  Focus on safety awareness, activity tolerance, dynamic standing balance, and adhering to LUE restrictions.  Pt able to recount restrictions and followed them throughout session.  Therapy Documentation Precautions:  Precautions Precautions: Fall;ICD/Pacemaker Precaution Comments: LUE DVT, on heparin, thoracic and lumbar compression fractures, AROM restrictions secondary to ICD placement (no shoulder flexion to 90 until Monday) Restrictions Weight Bearing Restrictions: No Other Position/Activity Restrictions:  (no lifting or ROM past 90 degrees) General:   Vital Signs: Therapy Vitals BP: 110/50 mmHg Pain: Pain Assessment Pain Assessment: No/denies pain Pain Score: 0-No pain  See FIM for current functional status  Therapy/Group: Individual Therapy  Leroy Libman 06/12/2012, 12:03 PM

## 2012-06-12 NOTE — Progress Notes (Addendum)
Patient ID: [name], female   DOB: [birth year], 72 y.o.   MRN: [id number] Complains of raw area on right buttock and discomfort around ICD site.moved bowels over weekend. Subjective/Complaints: A 12 point review of systems has been performed and if not noted above is otherwise negative.  Objective: Vital Signs: Blood pressure 119/39, pulse 72, temperature 98 F (36.7 C), temperature source Oral, resp. rate 17, weight 69.2 kg (152 lb 8.9 oz), SpO2 97.00%. No results found. Results for orders placed during the hospital encounter of 06/09/12 (from the past 72 hour(s))  GLUCOSE, CAPILLARY     Status: Abnormal   Collection Time    06/10/12  6:06 AM      Result Value Range   Glucose-Capillary 127 (*) 70 - 99 mg/dL  PROTIME-INR     Status: Abnormal   Collection Time    06/10/12  6:20 AM      Result Value Range   Prothrombin Time 21.5 (*) 11.6 - 15.2 seconds   INR 1.95 (*) 0.00 - 1.49  GLUCOSE, CAPILLARY     Status: Abnormal   Collection Time    06/10/12  7:27 AM      Result Value Range   Glucose-Capillary 132 (*) 70 - 99 mg/dL  GLUCOSE, CAPILLARY     Status: Abnormal   Collection Time    06/10/12 12:01 PM      Result Value Range   Glucose-Capillary 187 (*) 70 - 99 mg/dL  GLUCOSE, CAPILLARY     Status: None   Collection Time    06/10/12  5:00 PM      Result Value Range   Glucose-Capillary 99  70 - 99 mg/dL  GLUCOSE, CAPILLARY     Status: Abnormal   Collection Time    06/10/12 10:13 PM      Result Value Range   Glucose-Capillary 181 (*) 70 - 99 mg/dL   Comment 1 Notify RN    PROTIME-INR     Status: Abnormal   Collection Time    06/25/2012  6:00 AM      Result Value Range   Prothrombin Time 23.1 (*) 11.6 - 15.2 seconds   INR 2.15 (*) 0.00 - 1.49  GLUCOSE, CAPILLARY     Status: Abnormal   Collection Time    06/07/2012  7:09 AM      Result Value Range   Glucose-Capillary 120 (*) 70 - 99 mg/dL  GLUCOSE, CAPILLARY     Status: Abnormal   Collection Time    06/14/2012 11:27 AM    Result Value Range   Glucose-Capillary 206 (*) 70 - 99 mg/dL  GLUCOSE, CAPILLARY     Status: Abnormal   Collection Time    06/29/2012  3:58 PM      Result Value Range   Glucose-Capillary 130 (*) 70 - 99 mg/dL  GLUCOSE, CAPILLARY     Status: Abnormal   Collection Time    06/10/2012  9:29 PM      Result Value Range   Glucose-Capillary 139 (*) 70 - 99 mg/dL   Comment 1 Notify RN    CBC WITH DIFFERENTIAL     Status: Abnormal   Collection Time    06/12/12  6:28 AM      Result Value Range   WBC 7.5  4.0 - 10.5 K/uL   RBC 3.42 (*) 3.87 - 5.11 MIL/uL   Hemoglobin 9.5 (*) 12.0 - 15.0 g/dL   HCT 29.8 (*) 36.0 - 46.0 %   MCV 87.1  78.0 - 100.0 fL   MCH 27.8  26.0 - 34.0 pg   MCHC 31.9  30.0 - 36.0 g/dL   RDW 16.2 (*) 11.5 - 15.5 %   Platelets 270  150 - 400 K/uL   Neutrophils Relative 81 (*) 43 - 77 %   Neutro Abs 6.1  1.7 - 7.7 K/uL   Lymphocytes Relative 9 (*) 12 - 46 %   Lymphs Abs 0.7  0.7 - 4.0 K/uL   Monocytes Relative 7  3 - 12 %   Monocytes Absolute 0.5  0.1 - 1.0 K/uL   Eosinophils Relative 3  0 - 5 %   Eosinophils Absolute 0.2  0.0 - 0.7 K/uL   Basophils Relative 0  0 - 1 %   Basophils Absolute 0.0  0.0 - 0.1 K/uL  COMPREHENSIVE METABOLIC PANEL     Status: Abnormal   Collection Time    06/12/12  6:28 AM      Result Value Range   Sodium 139  135 - 145 mEq/L   Potassium 4.0  3.5 - 5.1 mEq/L   Chloride 101  96 - 112 mEq/L   CO2 30  19 - 32 mEq/L   Glucose, Bld 132 (*) 70 - 99 mg/dL   BUN 22  6 - 23 mg/dL   Creatinine, Ser 0.84  0.50 - 1.10 mg/dL   Calcium 8.7  8.4 - 10.5 mg/dL   Total Protein 5.8 (*) 6.0 - 8.3 g/dL   Albumin 2.7 (*) 3.5 - 5.2 g/dL   AST 15  0 - 37 U/L   ALT <5  0 - 35 U/L   Alkaline Phosphatase 150 (*) 39 - 117 U/L   Total Bilirubin 0.3  0.3 - 1.2 mg/dL   GFR calc non Af Amer 68 (*) >90 mL/min   GFR calc Af Amer 79 (*) >90 mL/min   Comment:            The eGFR has been calculated     using the CKD EPI equation.     This calculation has not been      validated in all clinical     situations.     eGFR's persistently     <90 mL/min signify     possible Chronic Kidney Disease.  PROTIME-INR     Status: Abnormal   Collection Time    06/12/12  6:28 AM      Result Value Range   Prothrombin Time 22.2 (*) 11.6 - 15.2 seconds   INR 2.04 (*) 0.00 - 1.49  GLUCOSE, CAPILLARY     Status: Abnormal   Collection Time    06/12/12  7:14 AM      Result Value Range   Glucose-Capillary 131 (*) 70 - 99 mg/dL   Comment 1 Notify RN       HEENT: normal Cardio: RRR and no murmur Resp: CTA B/L and unlabored GI: BS positive and non tender Extremity:  No Edema Skin:   Intact and Wound C/D/I. No edema. Appropriately tender. Has a small dried denuded area around right buttock which measures less than cm wide by 2.5 cm long Neuro: Alert/Oriented, Normal Sensory and Abnormal Motor 3-/5 Left delt due to restrictions otherwise 4/5 Musc/Skel:  Extremity tender mild tenderness Left shoulder Gen NAD   Assessment/Plan: 1. Functional deficits secondary to Deconditioning related to out of hospital V fib arrest which require 3+ hours per day of interdisciplinary therapy in a comprehensive inpatient rehab setting. Physiatrist is providing close  team supervision and 24 hour management of active medical problems listed below. Physiatrist and rehab team continue to assess barriers to discharge/monitor patient progress toward functional and medical goals. FIM: FIM - Bathing Bathing Steps Patient Completed: Chest;Right Arm;Left Arm;Abdomen;Front perineal area;Buttocks;Right upper leg;Left upper leg Bathing: 4: Min-Patient completes 8-9 20f 10 parts or 75+ percent  FIM - Upper Body Dressing/Undressing Upper body dressing/undressing steps patient completed: Thread/unthread right sleeve of pullover shirt/dresss;Thread/unthread left sleeve of pullover shirt/dress;Put head through opening of pull over shirt/dress;Pull shirt over trunk Upper body dressing/undressing: 4:  Min-Patient completed 75 plus % of tasks FIM - Lower Body Dressing/Undressing Lower body dressing/undressing steps patient completed: Thread/unthread left pants leg;Pull pants up/down;Thread/unthread right pants leg Lower body dressing/undressing: 3: Mod-Patient completed 50-74% of tasks  FIM - Toileting Toileting steps completed by patient: Performs perineal hygiene Toileting: 2: Max-Patient completed 1 of 3 steps  FIM - Radio producer Devices: Grab bars Toilet Transfers: 4-To toilet/BSC: Min A (steadying Pt. > 75%);4-From toilet/BSC: Min A (steadying Pt. > 75%)  FIM - Bed/Chair Transfer Bed/Chair Transfer Assistive Devices: Bed rails Bed/Chair Transfer: 5: Supine > Sit: Supervision (verbal cues/safety issues);3: Bed > Chair or W/C: Mod A (lift or lower assist);3: Chair or W/C > Bed: Mod A (lift or lower assist)  FIM - Locomotion: Wheelchair Distance: 150 (LUE restricted use after ICD) Locomotion: Wheelchair: 1: Total Assistance/staff pushes wheelchair (Pt<25%) FIM - Locomotion: Ambulation Locomotion: Ambulation Assistive Devices: Other (comment) (R HHA) Ambulation/Gait Assistance: 3: Mod assist Locomotion: Ambulation: 2: Travels 50 - 149 ft with moderate assistance (Pt: 50 - 74%)  Comprehension Comprehension Mode: Auditory Comprehension: 5-Understands complex 90% of the time/Cues < 10% of the time  Expression Expression Mode: Verbal Expression: 7-Expresses complex ideas: With no assist  Social Interaction Social Interaction: 7-Interacts appropriately with others - No medications needed.  Problem Solving Problem Solving: 5-Solves complex 90% of the time/cues < 10% of the time  Memory Memory: 5-Recognizes or recalls 90% of the time/requires cueing < 10% of the time  Medical Problem List and Plan:  1. deconditioning after cardiac arrest status post ICD placement, respiratory failure, multi-medical issues  2. DVT Prophylaxis/Anticoagulation:  Chronic Coumadin therapy. INR 1.95 06/09/2012  3. Pain Management: tylenol,hydrocodone as needed.  -pain under fair control at present.  4. Neuropsych: This patient is capable of making decisions on her own behalf.  5. Atrial fibrillation/bradycardia. Status post ICD placement 06/09/2012. Amiodarone 200 mg daily, coreg 6.25 mg twice a day, Cozaar 25 mg daily. Monitor rates with increased physical activity.  6. Diabetes mellitus with peripheral neuropathy. Latest hemoglobin A1c 6.2. Lantus insulin 10 units daily. Check blood sugars a.c. and at bedtime. Patient on Glucophage 500 mg twice a day prior to admission--resume today -adjust regimen as needed.  7. Chronic anemia: up to 9.5 today---continue to follow 8. Constipation:  Moved bowels multiple times over weekend. 9. GERD: protonix 10. Pressure sore: continue pressure relief measures. Area looks intact and should respond to conservative measures   LOS (Days) 3 A FACE TO FACE EVALUATION WAS PERFORMED  Calandria Mullings T 06/12/2012, 8:19 AM

## 2012-06-12 NOTE — Progress Notes (Signed)
Physical Therapy Session Note  Patient Details  Name: [name] MRN: [id number] Date of Birth: 1940-10-16  Today's Date: 06/12/2012 Time: [id number] Time Calculation (min): 78 min  Short Term Goals: Week 1:  PT Short Term Goal 1 (Week 1): = LTG secondary to short LOS  Skilled Therapeutic Interventions/Progress Updates:    Ambulation with RW 2 x 110' with cues for posture, decreased bil. UE reliance. Pt required min assist during challenges such as visual scanning/head turns and changes in gait speed. Ambulation without device to challenge dynamic balance and endurance 2 x 100' with min assist. Pt demonstrates narrowed base and lateral sway, variable cadence. Pt engaged in standing balance activities such as standing on compliant surface with varied bases of support (narrow, modified tandem) + head turns or eyes closed for increased difficulty. Min assist intermittently for loss of balance. Pt also worked on single limb stance stability by tapping heels alternately to cones, significant loss of balance initially needing mod assist to recover progressing to min assist. Balance further challenged on the Biodex working on limits of stability and weight shifting. Pt reported she enjoyed the Biodex. NuStep for strengthening and endurance 2 x 5 min at level 4, bil. LEs only.   Pt needed min verbal cues for ROM restrictions of Lt. UE throughout session.  SpO2 >95% on room air, HR 80s-90s bpm throughout session.  Therapy Documentation Precautions:  Precautions Precautions: Fall;ICD/Pacemaker Precaution Comments: LUE DVT, on heparin, thoracic and lumbar compression fractures, AROM restrictions secondary to ICD placement (no shoulder flexion to 90 until Monday) Restrictions Weight Bearing Restrictions: No Other Position/Activity Restrictions:  (no lifting or ROM past 90 degrees) Pain: Pain Assessment Pain Assessment: No/denies pain Pain Score: 0-No pain  See FIM for current functional  status  Therapy/Group: Individual Therapy  [name] 06/12/2012, 11:50 AM

## 2012-06-12 NOTE — Progress Notes (Signed)
Patient information reviewed and entered into eRehab system by Cayle Cordoba, RN, CRRN, PPS Coordinator.  Information including medical coding and functional independence measure will be reviewed and updated through discharge.     Per nursing patient was given "Data Collection Information Summary for Patients in Inpatient Rehabilitation Facilities with attached "Privacy Act Statement-Health Care Records" upon admission.  

## 2012-06-12 NOTE — Progress Notes (Signed)
Social Work Assessment and Plan Social Work Assessment and Plan  Patient Details  Name: [name] MRN: [id number] Date of Birth: 01-04-1941  Today's Date: 06/12/2012  Problem List:  Patient Active Problem List  Diagnosis  . VITAMIN B12 DEFICIENCY  . VITAMIN D DEFICIENCY  . HYPERLIPIDEMIA  . UNSPECIFIED IRON DEFICIENCY ANEMIA  . Essential hypertension, benign  . Atrial fibrillation  . CHOLELITHIASIS  . Celiac disease  . Mitral valve disease  . Encounter for long-term (current) use of anticoagulants  . Carcinoma in situ of vulva  . Fibroid  . Ovarian cyst, left  . Osteoporosis, idiopathic  . Type II or unspecified type diabetes mellitus without mention of complication, uncontrolled  . GERD (gastroesophageal reflux disease)  . Ischemic cardiomyopathy  . Back pain, thoracic  . Urinary frequency  . Bruit  . 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 Hypomagnesemia  . Ventricular fibrillation  . Acute respiratory failure  . Warfarin-induced coagulopathy  . Acute encephalopathy  . Metabolic acidosis  . Sustained ventricular fibrillation  . Sudden cardiac arrest  . Acute on chronic systolic heart failure  . Atelectasis  . Physical deconditioning   Past Medical History:  Past Medical History  Diagnosis Date  . Anemia   . Atrial fibrillation     A.fib/flutter: s/p MAZE 2008, DCCV 2011, on coumadin; previously on flecainide, but dc'd due to worsening EF. Tikosyn discontinued 06/2012 after cardiac arrest.  . Diverticulitis   . CVA (cerebral vascular accident)     2008 - felt due to oscillating calcium on aortic valve  . Carcinoma in situ of vulva   . Fibroid   . Ovarian cyst, left 2008-2009  . Osteoporosis   . Diabetes mellitus   . Hypertension   . GERD (gastroesophageal reflux disease)   . Chronic systolic CHF (congestive heart failure)     EF 35%  . Ischemic cardiomyopathy     EF 35%  . Coronary artery disease      Occluded LM 2/2 previous aortic root surgery; s/p 2v CABG 2010 LIMA to LAD, SVG to OM; Cath 03/11/12 patent SVG to OM, atretic LIMA to LAD, patent RCA, occluded native LM, EF 35%   . Arthritis     OSTEO  . Fracture of lumbar spine   . 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  . Superficial venous thrombosis of upper extremity     06/2012 - LUE  . Severe aortic stenosis     s/p homograft aortic root replacement 2008  . Mitral regurgitation     s/p mitral valve repair 2008  . Thoracic aortic aneurysm     s/p resection and grafting 2008   Past Surgical History:  Past Surgical History  Procedure Laterality Date  . Foot surgery      removed neuroma  . Aortic root replacement  2008    homograft  . Mv repair  2008    due to severe MR  . Resection and grafting of ascending thoracic aortic    . Aneurysm and left sided maze  2008  . Coronary artery bypass graft  4/10    LIMA to LAD, SVG to OM  . Wide excision of vulva      CA INSITU   Social History:  reports that she quit smoking about 29 years ago. She has never used smokeless tobacco.  She reports that she does not drink alcohol or use illicit drugs.  Family / Support Systems Marital Status: Married How Long?: 48 yrs. Patient Roles: Spouse Spouse/Significant Other: Lanis Mcwhite @ ([phone]) [phone] or [phone] Children: None Anticipated Caregiver: husband: Chavely Garity Ability/Limitations of Caregiver: mod assist Caregiver Availability: 24/7 Family Dynamics: very supportive husband who is available to provide 24/7 assist if needed.  Social History Preferred language: English Religion: Episcopalian Cultural Background: NA Education: college Read: Yes Write: Yes Employment Status: Retired Date Retired/Disabled/Unemployed: in her 34's Freight forwarder Issues:  none Guardian/Conservator: none   Abuse/Neglect Physical Abuse: Denies Verbal Abuse: Denies Sexual Abuse: Denies Exploitation of patient/patient's resources: Denies Self-Neglect: Denies  Emotional Status Pt's affect, behavior adn adjustment status: Very pleasant but admits very fatigued.  Has no recall of hospital admit.  Feels good about being here on CIR and rebuilding her strength.  Denies any significant emotional distress, however, will monitor. Recent Psychosocial Issues: Chronic health issues over past few years Pyschiatric History: none Substance Abuse History: none  Patient / Family Perceptions, Expectations & Goals Pt/Family understanding of illness & functional limitations: Pt and husband with basic understanding of recent, multiple medical issues as they have been explained to her by MD and family Premorbid pt/family roles/activities: Pt and husband share in home management.  Does admit her overall endurance was getting more decreased. Anticipated changes in roles/activities/participation: Husband to assume caregiver role as he has off and on in the past. Pt/family expectations/goals: Pt fully expects to reach an independent level but hopes to really improve her strength and balanace.  Community Resources [employer]s: None Premorbid Home Care/DME Agencies: None Transportation available at discharge: yes  Discharge Planning Living Arrangements: Spouse/significant other Support Systems: Spouse/significant other Type of Residence: Private residence Insurance underwriter Resources: Education officer, museum (specify) Nurse, mental health) Financial Resources: [place] Referred: No Living Expenses: Own Money Management: Spouse Do you have any problems obtaining your medications?: No Home Management: pt and husbandH Patient/Family Preliminary Plans: home with husband to provide any assist needed. Social Work Anticipated Follow Up Needs: HH/OP Expected length of  stay: 7-10 days  Clinical Impression Very pleasant and oriented woman here after cardiac arrest and resp failure.  Good support from husband. Anticpate short LOS with mod i goals.  No emotional distress noted/ reported  -will monitor.  Derrika Ruffalo 06/12/2012, 4:50 PM

## 2012-06-12 NOTE — Telephone Encounter (Signed)
TCM call: Pts husband states that the pt has not been d/c'd from hospital and is in [facility] rehab at this time for reconditioning.

## 2012-06-12 NOTE — Telephone Encounter (Signed)
**Note De-Identified Kalvin Buss Obfuscation** TCM call:  LMTCB.

## 2012-06-12 NOTE — Progress Notes (Addendum)
ANTICOAGULATION CONSULT NOTE - Follow Up Consult  Pharmacy Consult for coumadin Indication: afib  No Known Allergies  Patient Measurements: Weight: 152 lb 8.9 oz (69.2 kg) Heparin Dosing Weight:   Vital Signs: Temp: 98 F (36.7 C) (04/07 0500) Temp src: Oral (04/07 0500) BP: 110/50 mmHg (04/07 0916) Pulse Rate: 72 (04/07 0500)  Labs:  Recent Labs  06/10/12 0620 06/20/2012 0600 06/12/12 0628  HGB  --   --  9.5*  HCT  --   --  29.8*  PLT  --   --  270  LABPROT 21.5* 23.1* 22.2*  INR 1.95* 2.15* 2.04*  CREATININE  --   --  0.84    The CrCl is unknown because both a height and weight (above a minimum accepted value) are required for this calculation.   Medications:  Scheduled:  . amiodarone  200 mg Oral Daily  . carvedilol  6.25 mg Oral BID WC  . feeding supplement  237 mL Oral BID BM  . insulin aspart  0-9 Units Subcutaneous TID WC  . insulin glargine  10 Units Subcutaneous Q24H  . losartan  25 mg Oral Daily  . metFORMIN  500 mg Oral BID WC  . pantoprazole  40 mg Oral Daily  . polyethylene glycol  17 g Oral Daily  . [COMPLETED] warfarin  3 mg Oral ONCE-1800  . Warfarin - Pharmacist Dosing Inpatient   Does not apply q1800   Infusions:    Assessment: 72 yo female with hx of afib is currently on therapeutic coumadin.  INR 2.04 today. On amiodarone.  Per cardiology, patient didn't have mechanical AVR or DVT. Goal of Therapy:  INR 2-3    Plan:  1) Warfarin 3 mg x 1 (since dose missed on 06/10/12) 2) F/u PT/INR    Venise Ellingwood, Tsz-Yin 06/12/2012,10:39 AM

## 2012-06-12 NOTE — Progress Notes (Signed)
Physical Therapy Note  Patient Details  Name: [name] MRN: [id number] Date of Birth: 06-25-1940 Today's Date: 06/12/2012  1430-1525 (55 minutes) individual Pain: no reported pain Focus of treatment: gait training/endurance; bilateral LE strengthening focusing on sit to stand from various height surfaces Treatment: Gait to/from room RW (120 feet ) SBA ; sit to stand from chair (family room) min assist with decreased strength at end of extension range bilaterally; sit to stand x 10 from raised mat with UE assist SBA; sit to stand with hands on knees min assist; up/down 4 inch step alternating LEs X 10; partial wall knee squats 2 X 10.    Eun Vermeer,JIM 06/12/2012, 3:28 PM

## 2012-06-13 ENCOUNTER — Inpatient Hospital Stay (HOSPITAL_COMMUNITY): Payer: Medicare Other

## 2012-06-13 ENCOUNTER — Inpatient Hospital Stay (HOSPITAL_COMMUNITY): Payer: Medicare Other | Admitting: Physical Therapy

## 2012-06-13 ENCOUNTER — Inpatient Hospital Stay (HOSPITAL_COMMUNITY): Payer: Medicare Other | Admitting: Occupational Therapy

## 2012-06-13 LAB — GLUCOSE, CAPILLARY
Glucose-Capillary: 113 mg/dL — ABNORMAL HIGH (ref 70–99)
Glucose-Capillary: 122 mg/dL — ABNORMAL HIGH (ref 70–99)
Glucose-Capillary: 97 mg/dL (ref 70–99)

## 2012-06-13 MED ORDER — WARFARIN SODIUM 3 MG PO TABS
3.0000 mg | ORAL_TABLET | Freq: Once | ORAL | Status: AC
  Administered 2012-06-13: 3 mg via ORAL
  Filled 2012-06-13: qty 1

## 2012-06-13 NOTE — Progress Notes (Signed)
06/13/12  0600 Pt has stage 2 wound at her Rt buttock, Allevyn gentle border placed. Dan the PA is aware. Wound care nurse to follow up. We continue to monitor.   [name]

## 2012-06-13 NOTE — Progress Notes (Addendum)
Physical Therapy Session Note  Patient Details  Name: [name] MRN: [id number] Date of Birth: May 27, 1940  Today's Date: 06/13/2012 Time: [id number] Time Calculation (min): 43 min  Short Term Goals: Week 1:  PT Short Term Goal 1 (Week 1): = LTG secondary to short LOS  Skilled Therapeutic Interventions/Progress Updates:    Ambulation 2 x 150' with RW cues for posture. Car transfer with supervision, cues for negotiation of RW. Stairs at 4" height 2 x 4 reps with one hand hold assist to simulated home step entrance without railing. Ambulation 2 x 100' without device with min assist for lateral loss of balance secondary to cross stepping, added horizontal and vertical head movements for increased difficulty. 5 sit <> stands from low chair (like pt's low chair at home) min/mod assist, PT demonstrating appropriate mechanics for transition. Pt agrees she needs more practice with low surfaces. NuStep level 6 for two bouts of 5 min, bil. LEs only.   Pt needs continued work on sit <> stands from low chairs, bed mobility, and steps.   SpO2 >94% on room air, HR up to 95 bpm during session.   Therapy Documentation Precautions:  Precautions Precautions: Fall;ICD/Pacemaker Precaution Comments: LUE DVT, on heparin, thoracic and lumbar compression fractures, AROM restrictions secondary to ICD placement (no shoulder flexion to 90 until Monday) Restrictions Weight Bearing Restrictions: No Other Position/Activity Restrictions:  (no lifting or ROM past 90 degrees) Pain: Pain Assessment Pain Assessment: No/denies pain  See FIM for current functional status  Therapy/Group: Individual Therapy  [name] 06/13/2012, 2:33 PM

## 2012-06-13 NOTE — Progress Notes (Signed)
Physical Therapy Session Note  Patient Details  Name: [name] MRN: [id number] Date of Birth: 10-01-1940  Today's Date: 06/13/2012 Time: [id number] Time Calculation (min): 55 min  Skilled Therapeutic Interventions/Progress Updates:    Ambulation 2 x 150' with RW and supervision, cues for posture and safety with RW (sometimes leaves RW behind). Practiced head turns during gait for challenge. Obstacle course with RW navigating obstacles, stepping over obstacles, stairs, and picking up objects from medium height surface overall supervision for RW negotiation with exception of mod assist needed for 6" steps secondary to LE weakness. Step up/downs with step 2 x 5 reps each for strengthening. With second set pt required max assist to prevent fall secondary to Rt. Leg giving way. Squats x 10 reps edge of mat.   Cushion in wheelchair changed to pressure relief cushion, RW height adjusted for improved posture.   Therapy Documentation Precautions:  Precautions Precautions: Fall;ICD/Pacemaker Precaution Comments: LUE DVT, on heparin, thoracic and lumbar compression fractures, AROM restrictions secondary to ICD placement (no shoulder flexion to 90 until Monday) Restrictions Weight Bearing Restrictions: No Other Position/Activity Restrictions:  (no lifting or ROM past 90 degrees) General:   Vital Signs:   Pain:  no   See FIM for current functional status  Therapy/Group: Individual Therapy  [name] 06/13/2012, 12:28 PM

## 2012-06-13 NOTE — Progress Notes (Signed)
Occupational Therapy Session Note  Patient Details  Name: [name] MRN: [id number] Date of Birth: 11/13/1940  Today's Date: 06/13/2012 Time: 1350-1420 Time Calculation (min): 30 min  Short Term Goals=LTGs  Skilled Therapeutic Interventions/Progress Updates:  Addressed IADL tasks in therapy kitchen.  Focused session on activity tolerance, walker safety, explore kitchen cabinets and refrigerator.  Patient reports that she might prepare some Jello for her husband at home so in a future OT session she plans to prepare it here for practice.  Patient chose to ambulate with RW back to her room supervision level the sat in w/c with all items within reach.   Therapy Documentation Precautions:  Precautions Precautions: Fall;ICD/Pacemaker Precaution Comments: LUE DVT, on heparin, thoracic and lumbar compression fractures, AROM restrictions secondary to ICD placement (no shoulder flexion to 90 until Monday) Restrictions Weight Bearing Restrictions: No Other Position/Activity Restrictions:  (no lifting or ROM past 90 degrees) Pain: Pain Assessment Pain Assessment: No/denies pain  Therapy/Group: Individual Therapy  Maleka Contino 06/13/2012, 5:04 PM

## 2012-06-13 NOTE — Progress Notes (Signed)
ANTICOAGULATION CONSULT NOTE - Follow Up Consult  Pharmacy Consult for coumadin Indication: atrial fibrillation  No Known Allergies  Patient Measurements: Weight: 151 lb 7.3 oz (68.7 kg) Heparin Dosing Weight:   Vital Signs: Temp: 98.7 F (37.1 C) (04/08 0500) Temp src: Oral (04/08 0500) BP: 128/77 mmHg (04/08 0500) Pulse Rate: 94 (04/08 0500)  Labs:  Recent Labs  07/03/2012 0600 06/12/12 0628 06/13/12 0620  HGB  --  9.5*  --   HCT  --  29.8*  --   PLT  --  270  --   LABPROT 23.1* 22.2* 22.8*  INR 2.15* 2.04* 2.11*  CREATININE  --  0.84  --     The CrCl is unknown because both a height and weight (above a minimum accepted value) are required for this calculation.   Medications:  Scheduled:  . amiodarone  200 mg Oral Daily  . carvedilol  6.25 mg Oral BID WC  . feeding supplement  237 mL Oral BID BM  . insulin aspart  0-9 Units Subcutaneous TID WC  . insulin glargine  10 Units Subcutaneous Q24H  . losartan  25 mg Oral Daily  . metFORMIN  500 mg Oral BID WC  . pantoprazole  40 mg Oral Daily  . polyethylene glycol  17 g Oral Daily  . [COMPLETED] warfarin  3 mg Oral ONCE-1800  . Warfarin - Pharmacist Dosing Inpatient   Does not apply q1800   Infusions:    Assessment: 72 yo female with afib is currently on therapeutic coumadin.  INR today is 2.11 from 2.04 Goal of Therapy:  INR 2-3    Plan:  1) Coumadin 3mg  po x1 2) INR in am  Kaiven Vester, Tsz-Yin 06/13/2012,8:32 AM

## 2012-06-13 NOTE — Progress Notes (Signed)
Patient ID: [name], female   DOB: Sep 11, 1940, 72 y.o.   MRN: [id number] Buttocks area remains a little raw, bled a bit. General truncal soreness. Denies other problems today. Subjective/Complaints: A 12 point review of systems has been performed and if not noted above is otherwise negative.  Objective: Vital Signs: Blood pressure 128/77, pulse 94, temperature 98.7 F (37.1 C), temperature source Oral, resp. rate 19, weight 68.7 kg (151 lb 7.3 oz), SpO2 94.00%. No results found. Results for orders placed during the hospital encounter of 06/09/12 (from the past 72 hour(s))  GLUCOSE, CAPILLARY     Status: Abnormal   Collection Time    06/10/12 12:01 PM      Result Value Range   Glucose-Capillary 187 (*) 70 - 99 mg/dL  GLUCOSE, CAPILLARY     Status: None   Collection Time    06/10/12  5:00 PM      Result Value Range   Glucose-Capillary 99  70 - 99 mg/dL  GLUCOSE, CAPILLARY     Status: Abnormal   Collection Time    06/10/12 10:13 PM      Result Value Range   Glucose-Capillary 181 (*) 70 - 99 mg/dL   Comment 1 Notify RN    PROTIME-INR     Status: Abnormal   Collection Time    06/25/2012  6:00 AM      Result Value Range   Prothrombin Time 23.1 (*) 11.6 - 15.2 seconds   INR 2.15 (*) 0.00 - 1.49  GLUCOSE, CAPILLARY     Status: Abnormal   Collection Time    06/16/2012  7:09 AM      Result Value Range   Glucose-Capillary 120 (*) 70 - 99 mg/dL  GLUCOSE, CAPILLARY     Status: Abnormal   Collection Time    06/15/2012 11:27 AM      Result Value Range   Glucose-Capillary 206 (*) 70 - 99 mg/dL  GLUCOSE, CAPILLARY     Status: Abnormal   Collection Time    06/22/2012  3:58 PM      Result Value Range   Glucose-Capillary 130 (*) 70 - 99 mg/dL  GLUCOSE, CAPILLARY     Status: Abnormal   Collection Time    06/13/2012  9:29 PM      Result Value Range   Glucose-Capillary 139 (*) 70 - 99 mg/dL   Comment 1 Notify RN    CBC WITH DIFFERENTIAL     Status: Abnormal   Collection Time    06/12/12   6:28 AM      Result Value Range   WBC 7.5  4.0 - 10.5 K/uL   RBC 3.42 (*) 3.87 - 5.11 MIL/uL   Hemoglobin 9.5 (*) 12.0 - 15.0 g/dL   HCT 29.8 (*) 36.0 - 46.0 %   MCV 87.1  78.0 - 100.0 fL   MCH 27.8  26.0 - 34.0 pg   MCHC 31.9  30.0 - 36.0 g/dL   RDW 16.2 (*) 11.5 - 15.5 %   Platelets 270  150 - 400 K/uL   Neutrophils Relative 81 (*) 43 - 77 %   Neutro Abs 6.1  1.7 - 7.7 K/uL   Lymphocytes Relative 9 (*) 12 - 46 %   Lymphs Abs 0.7  0.7 - 4.0 K/uL   Monocytes Relative 7  3 - 12 %   Monocytes Absolute 0.5  0.1 - 1.0 K/uL   Eosinophils Relative 3  0 - 5 %   Eosinophils Absolute 0.2  0.0 - 0.7 K/uL   Basophils Relative 0  0 - 1 %   Basophils Absolute 0.0  0.0 - 0.1 K/uL  COMPREHENSIVE METABOLIC PANEL     Status: Abnormal   Collection Time    06/12/12  6:28 AM      Result Value Range   Sodium 139  135 - 145 mEq/L   Potassium 4.0  3.5 - 5.1 mEq/L   Chloride 101  96 - 112 mEq/L   CO2 30  19 - 32 mEq/L   Glucose, Bld 132 (*) 70 - 99 mg/dL   BUN 22  6 - 23 mg/dL   Creatinine, Ser 0.84  0.50 - 1.10 mg/dL   Calcium 8.7  8.4 - 10.5 mg/dL   Total Protein 5.8 (*) 6.0 - 8.3 g/dL   Albumin 2.7 (*) 3.5 - 5.2 g/dL   AST 15  0 - 37 U/L   ALT <5  0 - 35 U/L   Alkaline Phosphatase 150 (*) 39 - 117 U/L   Total Bilirubin 0.3  0.3 - 1.2 mg/dL   GFR calc non Af Amer 68 (*) >90 mL/min   GFR calc Af Amer 79 (*) >90 mL/min   Comment:            The eGFR has been calculated     using the CKD EPI equation.     This calculation has not been     validated in all clinical     situations.     eGFR's persistently     <90 mL/min signify     possible Chronic Kidney Disease.  PROTIME-INR     Status: Abnormal   Collection Time    06/12/12  6:28 AM      Result Value Range   Prothrombin Time 22.2 (*) 11.6 - 15.2 seconds   INR 2.04 (*) 0.00 - 1.49  GLUCOSE, CAPILLARY     Status: Abnormal   Collection Time    06/12/12  7:14 AM      Result Value Range   Glucose-Capillary 131 (*) 70 - 99 mg/dL    Comment 1 Notify RN    GLUCOSE, CAPILLARY     Status: Abnormal   Collection Time    06/12/12 11:54 AM      Result Value Range   Glucose-Capillary 126 (*) 70 - 99 mg/dL   Comment 1 Notify RN    GLUCOSE, CAPILLARY     Status: Abnormal   Collection Time    06/12/12  4:30 PM      Result Value Range   Glucose-Capillary 111 (*) 70 - 99 mg/dL  GLUCOSE, CAPILLARY     Status: Abnormal   Collection Time    06/12/12  9:13 PM      Result Value Range   Glucose-Capillary 170 (*) 70 - 99 mg/dL  PROTIME-INR     Status: Abnormal   Collection Time    06/13/12  6:20 AM      Result Value Range   Prothrombin Time 22.8 (*) 11.6 - 15.2 seconds   INR 2.11 (*) 0.00 - 1.49     HEENT: normal Cardio: RRR and no murmur Resp: CTA B/L and unlabored GI: BS positive and non tender Extremity:  No Edema Skin:   Intact and Wound C/D/I. No edema. Appropriately tender. Has a small, now moist, denuded area around right buttock which measures less than cm wide by 2.5 cm long Neuro: Alert/Oriented, Normal Sensory and Abnormal Motor 3-/5 Left delt due to restrictions  otherwise 4/5 Musc/Skel:  Extremity tender mild tenderness Left shoulder Gen NAD   Assessment/Plan: 1. Functional deficits secondary to Deconditioning related to out of hospital V fib arrest which require 3+ hours per day of interdisciplinary therapy in a comprehensive inpatient rehab setting. Physiatrist is providing close team supervision and 24 hour management of active medical problems listed below. Physiatrist and rehab team continue to assess barriers to discharge/monitor patient progress toward functional and medical goals. FIM: FIM - Bathing Bathing Steps Patient Completed: Chest;Right Arm;Left Arm;Abdomen;Front perineal area;Buttocks;Left lower leg (including foot);Right lower leg (including foot);Left upper leg;Right upper leg Bathing: 5: Supervision: Safety issues/verbal cues  FIM - Upper Body Dressing/Undressing Upper body  dressing/undressing steps patient completed: Thread/unthread right sleeve of pullover shirt/dresss;Thread/unthread left sleeve of pullover shirt/dress;Put head through opening of pull over shirt/dress;Pull shirt over trunk Upper body dressing/undressing: 5: Set-up assist to: Obtain clothing/put away FIM - Lower Body Dressing/Undressing Lower body dressing/undressing steps patient completed: Thread/unthread right pants leg;Thread/unthread left pants leg;Pull pants up/down;Don/Doff left sock;Don/Doff right sock Lower body dressing/undressing: 4: Steadying Assist  FIM - Toileting Toileting steps completed by patient: Performs perineal hygiene Toileting: 2: Max-Patient completed 1 of 3 steps  FIM - Radio producer Devices: Grab bars Toilet Transfers: 4-To toilet/BSC: Min A (steadying Pt. > 75%);4-From toilet/BSC: Min A (steadying Pt. > 75%)  FIM - Bed/Chair Transfer Bed/Chair Transfer Assistive Devices: Bed rails Bed/Chair Transfer: 5: Supine > Sit: Supervision (verbal cues/safety issues);3: Bed > Chair or W/C: Mod A (lift or lower assist);3: Chair or W/C > Bed: Mod A (lift or lower assist)  FIM - Locomotion: Wheelchair Distance: 150 (LUE restricted use after ICD) Locomotion: Wheelchair: 1: Total Assistance/staff pushes wheelchair (Pt<25%) FIM - Locomotion: Ambulation Locomotion: Ambulation Assistive Devices: Other (comment) (R HHA) Ambulation/Gait Assistance: 4: Min assist Locomotion: Ambulation: 2: Travels 102 - 149 ft with minimal assistance (Pt.>75%)  Comprehension Comprehension Mode: Auditory Comprehension: 5-Understands complex 90% of the time/Cues < 10% of the time  Expression Expression Mode: Verbal Expression: 7-Expresses complex ideas: With no assist  Social Interaction Social Interaction: 7-Interacts appropriately with others - No medications needed.  Problem Solving Problem Solving: 5-Solves basic 90% of the time/requires cueing < 10% of the  time  Memory Memory: 5-Requires cues to use assistive device  Medical Problem List and Plan:  1. deconditioning after cardiac arrest status post ICD placement, respiratory failure, multi-medical issues  2. DVT Prophylaxis/Anticoagulation: Chronic Coumadin therapy. INR 1.95 06/09/2012  3. Pain Management: tylenol,hydrocodone as needed.  -pain under fair control at present.  4. Neuropsych: This patient is capable of making decisions on her own behalf.  5. Atrial fibrillation/bradycardia. Status post ICD placement 06/09/2012. Amiodarone 200 mg daily, coreg 6.25 mg twice a day, Cozaar 25 mg daily. Monitor rates with increased physical activity.  6. Diabetes mellitus with peripheral neuropathy. Latest hemoglobin A1c 6.2. Lantus insulin 10 units daily. Patient on Glucophage 500 pta--  -added to regimen yesterday with good results thus far  -adjust regimen as needed.  7. Chronic anemia: up to 9.5 today---continue to follow 8. Constipation:  Moved bowels multiple times over weekend. 9. GERD: protonix 10. Pressure sore: continue pressure relief measures. Area looks intact and should respond to conservative measures   LOS (Days) 4 A FACE TO FACE EVALUATION WAS PERFORMED  SWARTZ,ZACHARY T 06/13/2012, 8:07 AM

## 2012-06-13 NOTE — Progress Notes (Signed)
Occupational Therapy Session Note  Patient Details  Name: [name] MRN: [id number] Date of Birth: Oct 17, 1940  Today's Date: 06/13/2012 Time: 0900-0957 Time Calculation (min): 57 min  Short Term Goals: Week 1:   STG=LTG  Skilled Therapeutic Interventions/Progress Updates:    Pt engaged in bathing and dressing tasks w/c level at sink.  Pt completed UB bathing tasks and grooming tasks while standing at sink.  Pt completed all tasks at supervision level with occasional verbal cues for safety.  Husband joined session as pt was rolling to ADL apartment.  Pt practiced shower stall transfers with steady A.  Pt ambulated with RW from ADL apartment to room. Discussed discharge and therapy options.  Husband and patient pleased with progress.  Focus on activity tolerance, dynamic standing balance, and safety awareness.  Therapy Documentation Precautions:  Precautions Precautions: Fall;ICD/Pacemaker Precaution Comments: LUE DVT, on heparin, thoracic and lumbar compression fractures, AROM restrictions secondary to ICD placement (no shoulder flexion to 90 until Monday) Restrictions Weight Bearing Restrictions: No Other Position/Activity Restrictions:  (no lifting or ROM past 90 degrees)  See FIM for current functional status  Therapy/Group: Individual Therapy  Leroy Libman 06/13/2012, 9:58 AM

## 2012-06-13 NOTE — Progress Notes (Signed)
[facility] Individual Statement of Services  Patient Name:  [name]  Date:  06/13/2012  Welcome to the [facility].  Our goal is to provide you with an individualized program based on your diagnosis and situation, designed to meet your specific needs.  With this comprehensive rehabilitation program, you will be expected to participate in at least 3 hours of rehabilitation therapies Monday-Friday, with modified therapy programming on the weekends.  Your rehabilitation program will include the following services:  Physical Therapy (PT), Occupational Therapy (OT), Speech Therapy (ST), 24 hour per day rehabilitation nursing, Therapeutic Recreaction (TR), Neuropsychology, Case Management (Social Worker), Rehabilitation Medicine, Nutrition Services and Pharmacy Services  Weekly team conferences will be held on Tuesdays to discuss your progress.  Your Social Worker will talk with you frequently to get your input and to update you on team discussions.  Team conferences with you and your family in attendance may also be held.  Expected length of stay: 7-10 days  Overall anticipated outcome: modified independent  Depending on your progress and recovery, your program may change. Your Social Worker will coordinate services and will keep you informed of any changes. Your Social Worker's name and contact numbers are listed  below.  The following services may also be recommended but are not provided by the [facility] will be made to provide these services after discharge if needed.  Arrangements include referral to agencies that provide these services.  Your insurance has been verified to be:  Medicare and [place] Your primary doctor is:  Dr. Scarlette Calico  Pertinent information will be shared with your doctor and your insurance  company.  Social Worker:  [place], [place] or [phone]  Information discussed with and copy given to patient by: Lennart Pall, 06/13/2012, 10:55 AM

## 2012-06-14 ENCOUNTER — Inpatient Hospital Stay (HOSPITAL_COMMUNITY): Payer: Medicare Other | Admitting: Occupational Therapy

## 2012-06-14 ENCOUNTER — Inpatient Hospital Stay (HOSPITAL_COMMUNITY): Payer: Medicare Other | Admitting: Physical Therapy

## 2012-06-14 ENCOUNTER — Inpatient Hospital Stay (HOSPITAL_COMMUNITY): Payer: Medicare Other

## 2012-06-14 LAB — PROTIME-INR: INR: 2.48 — ABNORMAL HIGH (ref 0.00–1.49)

## 2012-06-14 LAB — GLUCOSE, CAPILLARY: Glucose-Capillary: 107 mg/dL — ABNORMAL HIGH (ref 70–99)

## 2012-06-14 MED ORDER — WARFARIN SODIUM 3 MG PO TABS
3.0000 mg | ORAL_TABLET | Freq: Once | ORAL | Status: AC
  Administered 2012-06-14: 3 mg via ORAL
  Filled 2012-06-14: qty 1

## 2012-06-14 NOTE — Progress Notes (Signed)
Patient ID: [name], female   DOB: 23-Oct-1940, 72 y.o.   MRN: [id number] Had a good day yesterday. No complaints yesterday. Happy with progress. Slept well last night. Subjective/Complaints: A 12 point review of systems has been performed and if not noted above is otherwise negative.  Objective: Vital Signs: Blood pressure 110/72, pulse 78, temperature 97.6 F (36.4 C), temperature source Oral, resp. rate 18, weight 70.6 kg (155 lb 10.3 oz), SpO2 96.00%. No results found. Results for orders placed during the hospital encounter of 06/09/12 (from the past 72 hour(s))  GLUCOSE, CAPILLARY     Status: Abnormal   Collection Time    06/28/2012 11:27 AM      Result Value Range   Glucose-Capillary 206 (*) 70 - 99 mg/dL  GLUCOSE, CAPILLARY     Status: Abnormal   Collection Time    06/21/2012  3:58 PM      Result Value Range   Glucose-Capillary 130 (*) 70 - 99 mg/dL  GLUCOSE, CAPILLARY     Status: Abnormal   Collection Time    06/30/2012  9:29 PM      Result Value Range   Glucose-Capillary 139 (*) 70 - 99 mg/dL   Comment 1 Notify RN    CBC WITH DIFFERENTIAL     Status: Abnormal   Collection Time    06/12/12  6:28 AM      Result Value Range   WBC 7.5  4.0 - 10.5 K/uL   RBC 3.42 (*) 3.87 - 5.11 MIL/uL   Hemoglobin 9.5 (*) 12.0 - 15.0 g/dL   HCT 29.8 (*) 36.0 - 46.0 %   MCV 87.1  78.0 - 100.0 fL   MCH 27.8  26.0 - 34.0 pg   MCHC 31.9  30.0 - 36.0 g/dL   RDW 16.2 (*) 11.5 - 15.5 %   Platelets 270  150 - 400 K/uL   Neutrophils Relative 81 (*) 43 - 77 %   Neutro Abs 6.1  1.7 - 7.7 K/uL   Lymphocytes Relative 9 (*) 12 - 46 %   Lymphs Abs 0.7  0.7 - 4.0 K/uL   Monocytes Relative 7  3 - 12 %   Monocytes Absolute 0.5  0.1 - 1.0 K/uL   Eosinophils Relative 3  0 - 5 %   Eosinophils Absolute 0.2  0.0 - 0.7 K/uL   Basophils Relative 0  0 - 1 %   Basophils Absolute 0.0  0.0 - 0.1 K/uL  COMPREHENSIVE METABOLIC PANEL     Status: Abnormal   Collection Time    06/12/12  6:28 AM      Result Value  Range   Sodium 139  135 - 145 mEq/L   Potassium 4.0  3.5 - 5.1 mEq/L   Chloride 101  96 - 112 mEq/L   CO2 30  19 - 32 mEq/L   Glucose, Bld 132 (*) 70 - 99 mg/dL   BUN 22  6 - 23 mg/dL   Creatinine, Ser 0.84  0.50 - 1.10 mg/dL   Calcium 8.7  8.4 - 10.5 mg/dL   Total Protein 5.8 (*) 6.0 - 8.3 g/dL   Albumin 2.7 (*) 3.5 - 5.2 g/dL   AST 15  0 - 37 U/L   ALT <5  0 - 35 U/L   Alkaline Phosphatase 150 (*) 39 - 117 U/L   Total Bilirubin 0.3  0.3 - 1.2 mg/dL   GFR calc non Af Amer 68 (*) >90 mL/min   GFR calc  Af Amer 79 (*) >90 mL/min   Comment:            The eGFR has been calculated     using the CKD EPI equation.     This calculation has not been     validated in all clinical     situations.     eGFR's persistently     <90 mL/min signify     possible Chronic Kidney Disease.  PROTIME-INR     Status: Abnormal   Collection Time    06/12/12  6:28 AM      Result Value Range   Prothrombin Time 22.2 (*) 11.6 - 15.2 seconds   INR 2.04 (*) 0.00 - 1.49  GLUCOSE, CAPILLARY     Status: Abnormal   Collection Time    06/12/12  7:14 AM      Result Value Range   Glucose-Capillary 131 (*) 70 - 99 mg/dL   Comment 1 Notify RN    GLUCOSE, CAPILLARY     Status: Abnormal   Collection Time    06/12/12 11:54 AM      Result Value Range   Glucose-Capillary 126 (*) 70 - 99 mg/dL   Comment 1 Notify RN    GLUCOSE, CAPILLARY     Status: Abnormal   Collection Time    06/12/12  4:30 PM      Result Value Range   Glucose-Capillary 111 (*) 70 - 99 mg/dL  GLUCOSE, CAPILLARY     Status: Abnormal   Collection Time    06/12/12  9:13 PM      Result Value Range   Glucose-Capillary 170 (*) 70 - 99 mg/dL  PROTIME-INR     Status: Abnormal   Collection Time    06/13/12  6:20 AM      Result Value Range   Prothrombin Time 22.8 (*) 11.6 - 15.2 seconds   INR 2.11 (*) 0.00 - 1.49  GLUCOSE, CAPILLARY     Status: Abnormal   Collection Time    06/13/12  7:07 AM      Result Value Range   Glucose-Capillary 113  (*) 70 - 99 mg/dL  GLUCOSE, CAPILLARY     Status: Abnormal   Collection Time    06/13/12 11:22 AM      Result Value Range   Glucose-Capillary 122 (*) 70 - 99 mg/dL   Comment 1 Notify RN    GLUCOSE, CAPILLARY     Status: None   Collection Time    06/13/12  4:35 PM      Result Value Range   Glucose-Capillary 97  70 - 99 mg/dL   Comment 1 Notify RN    GLUCOSE, CAPILLARY     Status: Abnormal   Collection Time    06/13/12  9:03 PM      Result Value Range   Glucose-Capillary 141 (*) 70 - 99 mg/dL   Comment 1 Notify RN    PROTIME-INR     Status: Abnormal   Collection Time    06/14/12  6:00 AM      Result Value Range   Prothrombin Time 25.7 (*) 11.6 - 15.2 seconds   INR 2.48 (*) 0.00 - 1.49  GLUCOSE, CAPILLARY     Status: Abnormal   Collection Time    06/14/12  7:32 AM      Result Value Range   Glucose-Capillary 102 (*) 70 - 99 mg/dL   Comment 1 Notify RN       HEENT: normal Cardio: RRR  and no murmur Resp: CTA B/L and unlabored GI: BS positive and non tender Extremity:  No Edema Skin:   Intact and Wound C/D/I. No edema. Appropriately tender. Has a small, now moist, denuded area around right buttock which measures less than cm wide by 2.5 cm long Neuro: Alert/Oriented, Normal Sensory and Abnormal Motor 3-/5 Left delt due to restrictions otherwise 4/5 Musc/Skel:  Extremity tender mild tenderness Left shoulder Gen NAD   Assessment/Plan: 1. Functional deficits secondary to Deconditioning related to out of hospital V fib arrest which require 3+ hours per day of interdisciplinary therapy in a comprehensive inpatient rehab setting. Physiatrist is providing close team supervision and 24 hour management of active medical problems listed below. Physiatrist and rehab team continue to assess barriers to discharge/monitor patient progress toward functional and medical goals.  On track for dc Friday  FIM: FIM - Bathing Bathing Steps Patient Completed: Chest;Right Arm;Left  Arm;Abdomen;Front perineal area;Buttocks;Left lower leg (including foot);Right lower leg (including foot);Left upper leg;Right upper leg Bathing: 5: Supervision: Safety issues/verbal cues  FIM - Upper Body Dressing/Undressing Upper body dressing/undressing steps patient completed: Thread/unthread right sleeve of pullover shirt/dresss;Thread/unthread left sleeve of pullover shirt/dress;Put head through opening of pull over shirt/dress;Pull shirt over trunk Upper body dressing/undressing: 5: Set-up assist to: Obtain clothing/put away FIM - Lower Body Dressing/Undressing Lower body dressing/undressing steps patient completed: Thread/unthread right pants leg;Thread/unthread left pants leg;Pull pants up/down;Don/Doff left sock;Don/Doff right sock;Don/Doff right shoe;Don/Doff left shoe;Fasten/unfasten right shoe;Fasten/unfasten left shoe;Thread/unthread right underwear leg;Thread/unthread left underwear leg;Pull underwear up/down Lower body dressing/undressing: 5: Supervision: Safety issues/verbal cues  FIM - Toileting Toileting steps completed by patient: Performs perineal hygiene Toileting: 2: Max-Patient completed 1 of 3 steps  FIM - Radio producer Devices: Grab bars Toilet Transfers: 4-To toilet/BSC: Min A (steadying Pt. > 75%);4-From toilet/BSC: Min A (steadying Pt. > 75%)  FIM - Bed/Chair Transfer Bed/Chair Transfer Assistive Devices: Bed rails Bed/Chair Transfer: 5: Supine > Sit: Supervision (verbal cues/safety issues);3: Bed > Chair or W/C: Mod A (lift or lower assist);3: Chair or W/C > Bed: Mod A (lift or lower assist)  FIM - Locomotion: Wheelchair Distance: 150 (LUE restricted use after ICD) Locomotion: Wheelchair: 1: Total Assistance/staff pushes wheelchair (Pt<25%) FIM - Locomotion: Ambulation Locomotion: Ambulation Assistive Devices: Administrator Ambulation/Gait Assistance: 5: Supervision;4: Min guard Locomotion: Ambulation: 4: Travels 150 ft or  more with minimal assistance (Pt.>75%)  Comprehension Comprehension Mode: Auditory Comprehension: 5-Understands complex 90% of the time/Cues < 10% of the time  Expression Expression Mode: Verbal Expression: 7-Expresses complex ideas: With no assist  Social Interaction Social Interaction: 7-Interacts appropriately with others - No medications needed.  Problem Solving Problem Solving: 6-Solves complex problems: With extra time  Memory Memory: 6-More than reasonable amt of time  Medical Problem List and Plan:  1. deconditioning after cardiac arrest status post ICD placement, respiratory failure, multi-medical issues  2. DVT Prophylaxis/Anticoagulation: Chronic Coumadin therapy. INR 1.95 06/09/2012  3. Pain Management: tylenol,hydrocodone as needed.  -pain under fair control at present.  4. Neuropsych: This patient is capable of making decisions on her own behalf.  5. Atrial fibrillation/bradycardia. Status post ICD placement 06/09/2012. Amiodarone 200 mg daily, coreg 6.25 mg twice a day, Cozaar 25 mg daily. Monitor rates with increased physical activity.  6. Diabetes mellitus with peripheral neuropathy. Latest hemoglobin A1c 6.2. Lantus insulin 10 units daily. Patient on Glucophage 500 pta--  -added glucophage to regimen with good results thus far 7. Chronic anemia: up to 9.5  ---continue to follow  8. Constipation:  Moved bowels over weekend. 9. GERD: protonix 10. Pressure sore: continue pressure relief measures. Area looks intact and should respond to conservative measures   LOS (Days) 5 A FACE TO FACE EVALUATION WAS PERFORMED  Jaylynne Birkhead T 06/14/2012, 8:28 AM

## 2012-06-14 NOTE — Progress Notes (Signed)
NUTRITION FOLLOW UP  Intervention:   1. Continue Ensure Complete po BID, each supplement provides 350 kcal and 13 grams of protein.  2. RD will continue to follow  Nutrition Dx:   Inadequate oral intake related to poor appetite as evidenced by 40-50% meal completion. Improving.  Goal:   PO intake to meet >/=90% estimated nutrition needs. Met.  Monitor:   PO intake, weight trends, labs  Assessment:   Pt admitted from [facility] after VF arrest in car, requiring intubation and EN. Pt self extubated, post extubation diet was able to advance but pt with poor appetite. Pt with poor appetite prior to acute admission per husband. Started on nutrition supplements. S/p ICD placement.   Continues with order for Ensure Complete po BID. Meal intake remains variable, but slightly improved. Consuming 25 - 100%. Discussed meal intake with pt - she has Slim Fast and Atkins bars in her bedside table that she consumes with breakfast. Is drinking her Ensure with her lunch. She suspects that she will eat better once she goes home.  Height: Ht Readings from Last 1 Encounters:  05/18/12 5\' 6"  (1.676 m)    Weight Status:   Wt Readings from Last 1 Encounters:  06/14/12 155 lb 10.3 oz (70.6 kg)  Wt stable.  Re-estimated needs:  Kcal: 1550 - 1750 Protein: 65 - 75 Fluid: 1.6 - 1.8 liters  Skin: intact  Diet Order: Carb Control   Intake/Output Summary (Last 24 hours) at 06/14/12 0939 Last data filed at 06/13/12 1800  Gross per 24 hour  Intake    480 ml  Output      0 ml  Net    480 ml    Last BM: 4/7   Labs:   Recent Labs Lab 06/08/12 0545 06/09/12 0538 06/12/12 0628  NA 139 138 139  K 4.2 4.0 4.0  CL 99 100 101  CO2 32 30 30  BUN 30* 27* 22  CREATININE 0.97 0.99 0.84  CALCIUM 9.0 8.6 8.7  GLUCOSE 143* 124* 132*    CBG (last 3)   Recent Labs  06/13/12 1635 06/13/12 2103 06/14/12 0732  GLUCAP 97 141* 102*    Scheduled Meds: . amiodarone  200 mg Oral Daily  . carvedilol   6.25 mg Oral BID WC  . feeding supplement  237 mL Oral BID BM  . insulin aspart  0-9 Units Subcutaneous TID WC  . insulin glargine  10 Units Subcutaneous Q24H  . losartan  25 mg Oral Daily  . metFORMIN  500 mg Oral BID WC  . pantoprazole  40 mg Oral Daily  . polyethylene glycol  17 g Oral Daily  . warfarin  3 mg Oral ONCE-1800  . Warfarin - Pharmacist Dosing Inpatient   Does not apply q1800    Continuous Infusions:  none  Inda Coke MS, RD, LDN Pager: [phone] After-hours pager: [phone]

## 2012-06-14 NOTE — Progress Notes (Signed)
Occupational Therapy Session Note  Patient Details  Name: [name] MRN: [id number] Date of Birth: 02-07-1941  Today's Date: 06/14/2012 Time: 0800-0856 Time Calculation (min): 56 min  Short Term Goals: Week 1:   STG=LTG  Skilled Therapeutic Interventions/Progress Updates:    Pt engaged in bathing and dressing tasks sit<>stand from w/c at sink.  Pt completed bathed UB, perineal area, and buttocks while standing at sink.  Pt elected to don brief this morning secondary to bowel incontinence incident previous evening.  During session pt requested to use toilet X 2.  Pt ambulated with RW to bathroom at supervision level.  Pt completed all BADLs with supervision and occasional verbal cues for RW safety.  Focus on activity tolerance, dynamic standing balance, functional amb with RW, and safety awareness.  Therapy Documentation Precautions:  Precautions Precautions: Fall;ICD/Pacemaker Precaution Comments: LUE DVT, on heparin, thoracic and lumbar compression fractures, AROM restrictions secondary to ICD placement (no shoulder flexion to 90 until Monday) Restrictions Weight Bearing Restrictions: No Other Position/Activity Restrictions:  (no lifting or ROM past 90 degrees) General:   Pain: Pain Assessment Pain Assessment: No/denies pain ASee FIM for current functional status  Therapy/Group: Individual Therapy  Leroy Libman 06/14/2012, 8:57 AM

## 2012-06-14 NOTE — Progress Notes (Signed)
ANTICOAGULATION CONSULT NOTE - Follow Up Consult  Pharmacy Consult for coumadin Indication: atrial fibrillation  No Known Allergies  Patient Measurements: Weight: 155 lb 10.3 oz (70.6 kg) Heparin Dosing Weight:   Vital Signs: Temp: 97.6 F (36.4 C) (04/09 0532) Temp src: Oral (04/09 0532) BP: 110/72 mmHg (04/09 0806) Pulse Rate: 78 (04/09 0806)  Labs:  Recent Labs  06/12/12 0628 06/13/12 0620 06/14/12 0600  HGB 9.5*  --   --   HCT 29.8*  --   --   PLT 270  --   --   LABPROT 22.2* 22.8* 25.7*  INR 2.04* 2.11* 2.48*  CREATININE 0.84  --   --     The CrCl is unknown because both a height and weight (above a minimum accepted value) are required for this calculation.   Medications:  Scheduled:  . amiodarone  200 mg Oral Daily  . carvedilol  6.25 mg Oral BID WC  . feeding supplement  237 mL Oral BID BM  . insulin aspart  0-9 Units Subcutaneous TID WC  . insulin glargine  10 Units Subcutaneous Q24H  . losartan  25 mg Oral Daily  . metFORMIN  500 mg Oral BID WC  . pantoprazole  40 mg Oral Daily  . polyethylene glycol  17 g Oral Daily  . [COMPLETED] warfarin  3 mg Oral ONCE-1800  . Warfarin - Pharmacist Dosing Inpatient   Does not apply q1800   Infusions:    Assessment: 72 yo female with afib is currently on therapeutic coumadin.  INR today is 2.48.  On amiodarone Goal of Therapy:  INR 2-3    Plan:  1) Coumadin 3mg  po x1 2) INR in am  Treana Lacour, Tsz-Yin 06/14/2012,8:38 AM

## 2012-06-14 NOTE — Patient Care Conference (Signed)
Inpatient RehabilitationTeam Conference and Plan of Care Update Date: 06/13/2012   Time: 2:45 PM    Patient Name: [name]      Medical Record Number: [id number]  Date of Birth: 31-Jan-1941 Sex: Female         Room/Bed: 4037/4037-01 Payor Info: Payor: MEDICARE  Plan: MEDICARE PART A AND B  Product Type: *No Product type*     Admitting Diagnosis: DECONDITIONED  Admit Date/Time:  06/09/2012  9:28 PM Admission Comments: No comment available   Primary Diagnosis:  Physical deconditioning Principal Problem: Physical deconditioning  Patient Active Problem List   Diagnosis Date Noted  . Physical deconditioning 06/12/2012  . Atelectasis 05/31/2012  . Acute on chronic systolic heart failure 0000000  . Ventricular fibrillation 05/18/2012  . Acute respiratory failure 05/18/2012  . Warfarin-induced coagulopathy 05/18/2012  . Acute encephalopathy 05/18/2012  . Metabolic acidosis [id number]  . Sustained ventricular fibrillation 05/18/2012  . Sudden cardiac arrest 05/18/2012  . Hypomagnesemia 04/20/2012  . 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 Bruit 03/31/2012  . Urinary frequency 03/14/2012  . Back pain, thoracic 02/21/2012  . Ischemic cardiomyopathy 01/26/2012  . Carcinoma in situ of vulva   . Fibroid   . Ovarian cyst, left   . Osteoporosis, idiopathic   . Type II or unspecified type diabetes mellitus without mention of complication, uncontrolled   . GERD (gastroesophageal reflux disease)   . Encounter for long-term (current) use of anticoagulants 06/08/2010  . Mitral valve disease 04/16/2010  . VITAMIN D DEFICIENCY 11/05/2008  . VITAMIN B12 DEFICIENCY 10/22/2008  . Essential hypertension, benign 07/30/2008  . Celiac disease 08/09/2007  . HYPERLIPIDEMIA 07/21/2007  . Atrial fibrillation 07/21/2007  . CHOLELITHIASIS 07/21/2007  . UNSPECIFIED IRON DEFICIENCY ANEMIA 06/06/2007    Expected Discharge Date: Expected Discharge Date:  06/16/12  Team Members Present: Physician leading conference: Dr. Alger Simons Social Worker Present: Lennart Pall, LCSW Nurse Present: Julianne Rice, RN PT Present: Isabelle Course, PT OT Present: Forde Radon, OT;Roanna Epley, [place], OT SLP Present: Weston Anna, SLP Other (Discipline and Name): Daiva Nakayama, PPS Coordinator and Jena Gauss, RN     Current Status/Progress Goal Weekly Team Focus  Medical   anoxic BI, vfib cardiac arrest. improving pain and exercise tolerance. memory better  stabilize medically for dc  balance fluids, watch cardiac parameters   Bowel/Bladder   Pt is continent of bowel and bladder  Pt will remain continent of bowel and bladder      Swallow/Nutrition/ Hydration             ADL's   supervision/min A overall  mod I overall  activity tolerance; transfers   Mobility   supervision to min assist  modified independent, min assist stairs  Activity tolerance, dynamic balance, strengthening, family education.   Communication             Safety/Cognition/ Behavioral Observations            Pain   N/A         Skin   Stage II to right buttock  Area to buttock will heal with allevyn and pressure reduction cushion and shifting wait while in wheelchair  Continue to monitor    Rehab Goals Patient on target to meet rehab goals: Yes *See Care Plan and progress notes for long and short-term goals.  Barriers to Discharge: none    Possible Resolutions to Barriers:  none    Discharge Planning/Teaching Needs:  Home with husband who  can provide 24/7 assistance      Team Discussion:  Doing very well - better than originally expected.  Anticipate easily reaching mod i goals.  No concerns.  Revisions to Treatment Plan:  None   Continued Need for Acute Rehabilitation Level of Care: The patient requires daily medical management by a physician with specialized training in physical medicine and rehabilitation for the following conditions: Daily  direction of a multidisciplinary physical rehabilitation program to ensure safe treatment while eliciting the highest outcome that is of practical value to the patient.: Yes Daily medical management of patient stability for increased activity during participation in an intensive rehabilitation regime.: Yes Daily analysis of laboratory values and/or radiology reports with any subsequent need for medication adjustment of medical intervention for : Neurological problems;Other;Cardiac problems;Pulmonary problems  [name] 06/14/2012, 12:38 PM

## 2012-06-14 NOTE — Progress Notes (Signed)
Social Work Patient ID: [name], female   DOB: 1940/05/15, 72 y.o.   MRN: [id number]   Met yesterday with patient and husband to review team conference.  Both pleased and agreeable with targeted d/c date of 06/16/12 with modified independent goals.  Discussed follow up and DME needs.  Will continue to follow to assist with d/c needs.    Zaire Levesque, LCSW

## 2012-06-14 NOTE — Progress Notes (Signed)
Occupational Therapy Session Note  Patient Details  Name: [name] MRN: [id number] Date of Birth: 1940-12-29  Today's Date: 06/14/2012 Time: 1330-1400 Time Calculation (min): 30 min   Skilled Therapeutic Interventions/Progress Updates:    1:1 performing simple meal prep in the kitchen making jello with focus on standing balance, RW safety, maneuvering around kitchen problem solving how to transport items in the kitchen safely, practice functional ambulation from ADL apartment back to her room with steady A challenging her balance and endurance.  Therapy Documentation Precautions:  Precautions Precautions: Fall;ICD/Pacemaker Precaution Comments: LUE DVT, on heparin, thoracic and lumbar compression fractures, AROM restrictions secondary to ICD placement (no shoulder flexion to 90 until Monday) Restrictions Weight Bearing Restrictions: No Other Position/Activity Restrictions:  (no lifting or ROM past 90 degrees) Pain:  no c/o pain  See FIM for current functional status  Therapy/Group: Individual Therapy  Willeen Cass [facility] 06/14/2012, 3:30 PM

## 2012-06-14 NOTE — Progress Notes (Signed)
Physical Therapy Note  Patient Details  Name: [name] MRN: [id number] Date of Birth: [birth year] Today's Date: 06/14/2012  1000-1055 (55 minutes) individual Pain: no reported pain Focus of treatment: Therapeutic exercise focused on activity tolerance, bilateral LE strengthening; gait training/endurance; sit to stand from varying height surfaces Treatment: Gait to/from room 120 feet RW SBA; Nustep Level 5 X 10 minutes LEs only; partial wall squats 2 X 10 ; sit to stand with hands on knees from 23 inches, 22 inches , 20 inches x 5 SBA with posterior trunk lean; gait 160 feet close SBA (at hips) without  AD with stagger X 1 with direction change; alternate stepping to 6 inch step x 15 close SBA with increased lean to left.  1445 1530 ( 45 minutes) individual Pain: no complaint of pain Focus of treatment: gait training (steps) ; standing balance/standing tolerance Treatment: gait to/from gym 120 feet RW SBA; up/down 2 steps with no rails min assist leading with left LE up steps x 3; Biodex balance on unlevel surface level 12 [employer] game) with difficulty with posterior weight shifts; standing balance on soft surface performing Wii bowling close SBA ( one game).    Farron Lafond,JIM 06/14/2012, 10:13 AM

## 2012-06-15 ENCOUNTER — Inpatient Hospital Stay (HOSPITAL_COMMUNITY): Payer: Medicare Other

## 2012-06-15 ENCOUNTER — Inpatient Hospital Stay (HOSPITAL_COMMUNITY): Payer: Medicare Other | Admitting: Physical Therapy

## 2012-06-15 DIAGNOSIS — R5381 Other malaise: Secondary | ICD-10-CM

## 2012-06-15 DIAGNOSIS — I469 Cardiac arrest, cause unspecified: Secondary | ICD-10-CM

## 2012-06-15 DIAGNOSIS — I4891 Unspecified atrial fibrillation: Secondary | ICD-10-CM

## 2012-06-15 LAB — PROTIME-INR
INR: 2.65 — ABNORMAL HIGH (ref 0.00–1.49)
Prothrombin Time: 27 seconds — ABNORMAL HIGH (ref 11.6–15.2)

## 2012-06-15 LAB — GLUCOSE, CAPILLARY
Glucose-Capillary: 79 mg/dL (ref 70–99)
Glucose-Capillary: 168 mg/dL — ABNORMAL HIGH (ref 70–99)

## 2012-06-15 MED ORDER — WARFARIN SODIUM 3 MG PO TABS
3.0000 mg | ORAL_TABLET | Freq: Once | ORAL | Status: AC
  Administered 2012-06-15: 3 mg via ORAL
  Filled 2012-06-15: qty 1

## 2012-06-15 NOTE — Discharge Summary (Signed)
NAMERANISHA, [name] NO.:  [id number]  MEDICAL RECORD NO.:  [id number]  LOCATION:  [id number]                         FACILITY:  [place]  PHYSICIAN:  Meredith Staggers, M.D.DATE OF BIRTH:  1940/06/13  DATE OF ADMISSION:  06/09/2012 DATE OF DISCHARGE:  06/16/2012                              DISCHARGE SUMMARY   DISCHARGE DIAGNOSES:  Deconditioning after cardiac arrest status post ICD placement, chronic Coumadin for atrial fibrillation, pain management, diabetes mellitus, peripheral neuropathy, chronic anemia, constipation-resolved.  HISTORY OF PRESENT ILLNESS:  This is a 72 year old right-handed female with atrial fibrillation on chronic Coumadin as well as severe aortic stenosis, mitral regurgitation, who was admitted May 18, 2012, after becoming unresponsive while riding in the car.  On arrival to the ED, he was found to be in VF arrest.  Team estimated 45 minutes of ACLS.  INR upon admission of 5.00 and did receive fresh frozen plasma.  Cranial CT scan showed no acute abnormalities.  Question aspiration pneumonia placed on Unasyn.  Cardiology Service was consulted, maintained on Levophed for pressure support.  Troponin less than 0.30.  EKG demonstrated wide QRS and prolonged QT.  The patient maintained on ventilatory support.  Upper extremity Dopplers May 22, 2012 did show findings consistent with superficial vein thrombus left cephalic vein, left basilic vein.  The patient currently maintained on intravenous heparin as well as Coumadin therapy.  She remained intubated May 18, 2012 to May 23, 2012.  The patient with persistent atrial fibrillation as well as bradycardia underwent placement of an ICD June 08, 2012 per Dr. Rayann Heman.  The patient has chronic anemia 7.4-7.9 and monitored. Physical and occupational therapy ongoing.  The patient was admitted for a comprehensive rehab program.  PAST MEDICAL HISTORY:  See discharge diagnoses.  SOCIAL HISTORY:  Lives  with husband.  FUNCTIONAL HISTORY PRIOR TO ADMISSION:  Independent, retired.  FUNCTIONAL STATUS:  Upon admission to rehab services was minimal assist ambulate 120 feet with a rolling walker.  PHYSICAL EXAMINATION:  VITAL SIGNS:  Blood pressure 117/47, pulse 75, temperature 98.2, respirations 20. GENERAL:  This was an alert female, in no acute distress, oriented x3, follows simple commands, a little slow to process but generally appropriate. HEENT:  Pupils reactive to light. CARDIAC:  Rate controlled. ABDOMEN:  Soft, nontender.  Good bowel sounds.  REHABILITATION HOSPITAL COURSE:  The patient was admitted to inpatient rehab services with therapies initiated on a 3-hour daily basis consisting of physical therapy, occupational therapy, and rehabilitation nursing.  The following issues were addressed during the patient's rehabilitation stay.  Pertaining to [name]'s deconditioning after cardiac arrest she had undergone ICD placement.  She had no chest pain or shortness of breath while on rehab services and would follow up with Cardiology Services.  She remained on chronic Coumadin therapy, with no bleeding episodes.  Would follow up again with Cardiology Services, as well as the [facility] with latest INR of 2.65.  She did have a history of diabetes mellitus with peripheral neuropathy.  Latest hemoglobin A1c of 6.2.  She was on low-dose Lantus insulin.  She had been on Glucophage prior to hospital admission.  Full diabetic  teaching completed.  Chronic anemia again with no bleeding episodes.  Latest hemoglobin of 9.5. She did have some issues of constipation resolved with laxative assistance.  The patient received weekly collaborative interdisciplinary team conferences to discuss estimated length of stay, family teaching, any barriers to discharge.  She was continent of bowel and bladder.  Supervision minimal assist overall for activities daily living, supervision minimal  assist for mobility.  She did have a small stage II to a right buttock.  Skin was intact with pressure release. Allevyn was in place, as well as a cushion.  The patient was discharged to home.  DISCHARGE MEDICATIONS:  At the time of dictation included: 1. Amiodarone 200 mg p.o. daily. 2. Coreg 6.25 mg b.i.d. 3. Hydrocodone 5/325 one or two tablets every 4 hours as needed for     pain dispensed 60 tablets.  5. Cozaar 25 mg p.o. daily. 6. Glucophage 500 mg p.o. b.i.d. 7. Protonix 40 mg p.o. daily. 8. MiraLax 17 g daily with 8 ounces of water hold for loose stool. 9. Coumadin latest dose 3 mg adjusted accordingly for an INR of 2.0 to     3.0.  The patient would follow up with Dr. Alger Simons at the outpatient rehab service office as needed.  [facility] call for appointment.  Dr. Scarlette Calico June 26, 2012.  Follow up [facility] to check INR on June 19, 2012.  Ongoing therapies were arranged as per [employer].     Lauraine Rinne, P.A.   ______________________________ Meredith Staggers, M.D.    DA/MEDQ  D:  06/15/2012  T:  06/15/2012  Job:  [id number]  cc:   Minus Breeding, MD, [facility] Scarlette Calico, MD

## 2012-06-15 NOTE — Progress Notes (Signed)
Occupational Therapy Session Note  Patient Details  Name: [name] MRN: [id number] Date of Birth: [birth year]  Today's Date: 06/15/2012  Session 1 Time: 0800-0858 Time Calculation (min): 58 min  Short Term Goals: Week 1:   STG=LTG  Skilled Therapeutic Interventions/Progress Updates:    Pt engaged in BADLs in room for toileting, bathing, and dressing.  Pt amb in room with RW to gather clothing before amb with RW to bathroom for toileting.  Pt completed bathing and dressing tasks sitting and standing at sink.  Pt completed all tasks at mod I level and did not exhibit any unsafe behavior.  Focus on activity tolerance and safety awareness.  Therapy Documentation Precautions:  Precautions Precautions: Fall;ICD/Pacemaker Precaution Comments: LUE DVT, on heparin, thoracic and lumbar compression fractures, AROM restrictions secondary to ICD placement Restrictions Weight Bearing Restrictions: No Other Position/Activity Restrictions:  (no lifting or ROM past 90 degrees) General:   Pain: Pain Assessment Pain Assessment: No/denies pain  See FIM for current functional status  Therapy/Group: Individual Therapy  Session 2 Time: [id number] Pt denies pain Individual Therapy  Pt practiced shower transfers, furniture transfers, functional amb with RW for home mgmt tasks and light housekeeping tasks.  Discussed shower equipment needs and patient stated she would notify social worker on her decision.  Recommended shower seat for use at home.  Focus on safety awareness, energy conservation strategies, and functional amb with RW and RW safety.   Leotis Shames [facility] 06/15/2012, 9:00 AM

## 2012-06-15 NOTE — Discharge Summary (Signed)
  Discharge summary job # [id number]

## 2012-06-15 NOTE — Progress Notes (Signed)
Patient ID: [name], female   DOB: [birth year], 72 y.o.   MRN: [id number] No new problems. Strength improving. Balance not where it needs to be. Pleased with progress. Subjective/Complaints: A 12 point review of systems has been performed and if not noted above is otherwise negative.  Objective: Vital Signs: Blood pressure 120/47, pulse 82, temperature 98.3 F (36.8 C), temperature source Oral, resp. rate 18, weight 71.442 kg (157 lb 8 oz), SpO2 98.00%. No results found. Results for orders placed during the hospital encounter of 06/09/12 (from the past 72 hour(s))  GLUCOSE, CAPILLARY     Status: Abnormal   Collection Time    06/12/12 11:54 AM      Result Value Range   Glucose-Capillary 126 (*) 70 - 99 mg/dL   Comment 1 Notify RN    GLUCOSE, CAPILLARY     Status: Abnormal   Collection Time    06/12/12  4:30 PM      Result Value Range   Glucose-Capillary 111 (*) 70 - 99 mg/dL  GLUCOSE, CAPILLARY     Status: Abnormal   Collection Time    06/12/12  9:13 PM      Result Value Range   Glucose-Capillary 170 (*) 70 - 99 mg/dL  PROTIME-INR     Status: Abnormal   Collection Time    06/13/12  6:20 AM      Result Value Range   Prothrombin Time 22.8 (*) 11.6 - 15.2 seconds   INR 2.11 (*) 0.00 - 1.49  GLUCOSE, CAPILLARY     Status: Abnormal   Collection Time    06/13/12  7:07 AM      Result Value Range   Glucose-Capillary 113 (*) 70 - 99 mg/dL  GLUCOSE, CAPILLARY     Status: Abnormal   Collection Time    06/13/12 11:22 AM      Result Value Range   Glucose-Capillary 122 (*) 70 - 99 mg/dL   Comment 1 Notify RN    GLUCOSE, CAPILLARY     Status: None   Collection Time    06/13/12  4:35 PM      Result Value Range   Glucose-Capillary 97  70 - 99 mg/dL   Comment 1 Notify RN    GLUCOSE, CAPILLARY     Status: Abnormal   Collection Time    06/13/12  9:03 PM      Result Value Range   Glucose-Capillary 141 (*) 70 - 99 mg/dL   Comment 1 Notify RN    PROTIME-INR     Status: Abnormal   Collection Time    06/14/12  6:00 AM      Result Value Range   Prothrombin Time 25.7 (*) 11.6 - 15.2 seconds   INR 2.48 (*) 0.00 - 1.49  GLUCOSE, CAPILLARY     Status: Abnormal   Collection Time    06/14/12  7:32 AM      Result Value Range   Glucose-Capillary 102 (*) 70 - 99 mg/dL   Comment 1 Notify RN    GLUCOSE, CAPILLARY     Status: Abnormal   Collection Time    06/14/12  4:22 PM      Result Value Range   Glucose-Capillary 107 (*) 70 - 99 mg/dL   Comment 1 Notify RN    PROTIME-INR     Status: Abnormal   Collection Time    06/15/12  5:55 AM      Result Value Range   Prothrombin Time 27.0 (*) 11.6 -  15.2 seconds   INR 2.65 (*) 0.00 - 1.49     HEENT: normal Cardio: RRR and no murmur Resp: CTA B/L and unlabored GI: BS positive and non tender Extremity:  No Edema Skin:   Intact and Wound C/D/I. No edema. Appropriately tender. Has a small, now moist, denuded area around right buttock which measures less than cm wide by 2.5 cm long Neuro: Alert/Oriented, Normal Sensory and Abnormal Motor 3-/5 Left delt due to restrictions otherwise 4/5 Musc/Skel:  Extremity tender mild tenderness Left shoulder Gen NAD   Assessment/Plan: 1. Functional deficits secondary to Deconditioning related to out of hospital V fib arrest which require 3+ hours per day of interdisciplinary therapy in a comprehensive inpatient rehab setting. Physiatrist is providing close team supervision and 24 hour management of active medical problems listed below. Physiatrist and rehab team continue to assess barriers to discharge/monitor patient progress toward functional and medical goals.  Finalizing dc planning for Friday.  FIM: FIM - Bathing Bathing Steps Patient Completed: Chest;Right Arm;Left Arm;Abdomen;Front perineal area;Buttocks;Left lower leg (including foot);Right lower leg (including foot);Left upper leg;Right upper leg Bathing: 5: Supervision: Safety issues/verbal cues  FIM - Upper Body  Dressing/Undressing Upper body dressing/undressing steps patient completed: Thread/unthread right sleeve of pullover shirt/dresss;Thread/unthread left sleeve of pullover shirt/dress;Put head through opening of pull over shirt/dress;Pull shirt over trunk Upper body dressing/undressing: 5: Set-up assist to: Obtain clothing/put away FIM - Lower Body Dressing/Undressing Lower body dressing/undressing steps patient completed: Thread/unthread right pants leg;Thread/unthread left pants leg;Pull pants up/down;Don/Doff left sock;Don/Doff right sock;Don/Doff right shoe;Don/Doff left shoe;Fasten/unfasten right shoe;Fasten/unfasten left shoe;Thread/unthread right underwear leg;Thread/unthread left underwear leg;Pull underwear up/down;Fasten/unfasten pants Lower body dressing/undressing: 5: Supervision: Safety issues/verbal cues  FIM - Toileting Toileting steps completed by patient: Adjust clothing prior to toileting;Performs perineal hygiene;Adjust clothing after toileting Toileting: 5: Supervision: Safety issues/verbal cues  FIM - Radio producer Devices: Elevated toilet seat Toilet Transfers: 5-To toilet/BSC: Supervision (verbal cues/safety issues)  FIM - Control and instrumentation engineer Devices: Bed rails Bed/Chair Transfer: 5: Supine > Sit: Supervision (verbal cues/safety issues);3: Bed > Chair or W/C: Mod A (lift or lower assist);3: Chair or W/C > Bed: Mod A (lift or lower assist)  FIM - Locomotion: Wheelchair Distance: 150 (LUE restricted use after ICD) Locomotion: Wheelchair: 1: Total Assistance/staff pushes wheelchair (Pt<25%) FIM - Locomotion: Ambulation Locomotion: Ambulation Assistive Devices: Administrator Ambulation/Gait Assistance: 5: Supervision;4: Min guard Locomotion: Ambulation: 4: Travels 150 ft or more with minimal assistance (Pt.>75%)  Comprehension Comprehension Mode: Auditory Comprehension: 5-Understands complex 90% of the  time/Cues < 10% of the time  Expression Expression Mode: Verbal Expression: 7-Expresses complex ideas: With no assist  Social Interaction Social Interaction: 7-Interacts appropriately with others - No medications needed.  Problem Solving Problem Solving: 6-Solves complex problems: With extra time  Memory Memory: 6-More than reasonable amt of time  Medical Problem List and Plan:  1. deconditioning after cardiac arrest status post ICD placement, respiratory failure, multi-medical issues  2. DVT Prophylaxis/Anticoagulation: Chronic Coumadin therapy. INR 1.95 06/09/2012  3. Pain Management: tylenol,hydrocodone as needed.  -pain under fair control at present.  4. Neuropsych: This patient is capable of making decisions on her own behalf.  5. Atrial fibrillation/bradycardia. Status post ICD placement 06/09/2012. Amiodarone 200 mg daily, coreg 6.25 mg twice a day, Cozaar 25 mg daily.  -discussed low DBP with patient. Given cardiac issues, I would prefer to leave her meds as is for now as she's not symptomatic and her SBP is good. 6. Diabetes  mellitus with peripheral neuropathy. Latest hemoglobin A1c 6.2. Lantus insulin 10 units daily. Patient on Glucophage 500 pta--  -added glucophage to regimen with good results thus far 7. Chronic anemia: up to 9.5  ---continue to follow 8. Constipation:  Moved bowels over weekend. 9. GERD: protonix 10. Pressure sore: continue pressure relief measures. Area looks intact and should respond to conservative measures   LOS (Days) 6 A FACE TO FACE EVALUATION WAS PERFORMED  Marcial Pless T 06/15/2012, 8:04 AM

## 2012-06-15 NOTE — Progress Notes (Signed)
Occupational Therapy Discharge Summary  Patient Details  Name: [name] MRN: [id number] Date of Birth: 1940-03-29  Today's Date: 06/15/2012  Patient has met 11 of 11 long term goals due to improved activity tolerance, improved balance, postural control and ability to compensate for deficits. Pt has made excellent progress with bathing, dressing, toilet transfers, shower transfers, toileting, and IADLs such as simple snack prepr and light housekeeping.  Pt's husband has been present and provided appropriate level of supervision and assistance PRN.   Patient to discharge at overall Modified Independent level.  Patient's care partner is independent to provide the necessary IADL assistance at discharge.     Recommendation:  No f/u needed   Equipment: Recommended shower seat; pt undecided and stated they may pick one up after d/c  Reasons for discharge: treatment goals met and discharge from hospital  Patient/family agrees with progress made and goals achieved: Yes  OT Discharge Precautions/Restrictions  Precautions Precautions: Fall;ICD/Pacemaker Precaution Comments: LUE DVT, on heparin, thoracic and lumbar compression fractures, AROM restrictions secondary to ICD placement Restrictions Weight Bearing Restrictions: No ADL ADL Eating: Independent Where Assessed-Eating: Wheelchair Grooming: Independent Where Assessed-Grooming: Sitting at sink Upper Body Bathing: Independent Where Assessed-Upper Body Bathing: Sitting at sink Lower Body Bathing: Modified independent Where Assessed-Lower Body Bathing: Standing at sink;Sitting at sink Upper Body Dressing: Independent Where Assessed-Upper Body Dressing: Sitting at sink Lower Body Dressing: Modified independent Where Assessed-Lower Body Dressing: Standing at sink;Sitting at sink Toileting: Modified independent Where Assessed-Toileting: Glass blower/designer: Diplomatic Services operational officer Method: Information systems manager: Energy manager: Close supervision Social research officer, government Method: Heritage manager: Civil engineer, contracting with back Vision/Perception  Vision - History Baseline Vision: Wears contacts Patient Visual Report: No change from baseline Vision - Assessment Eye Alignment: Within Designer, television/film set Perception: Within Functional Limits Praxis Praxis: Intact  Cognition Overall Cognitive Status: Appears within functional limits for tasks assessed Arousal/Alertness: Awake/alert Orientation Level: Oriented X4 Attention: Sustained Sustained Attention: Appears intact Sensation Sensation Light Touch: Appears Intact Stereognosis: Appears Intact Hot/Cold: Appears Intact Proprioception: Appears Intact Trunk/Postural Assessment  Cervical Assessment Cervical Assessment: Within Functional Limits Thoracic Assessment Thoracic Assessment: Exceptions to [facility] Thoracic AROM Overall Thoracic AROM: Deficits Lumbar Assessment Lumbar Assessment: Exceptions to [facility] Lumbar AROM Overall Lumbar AROM: Deficits  Extremity/Trunk Assessment RUE Assessment RUE Assessment: Within Functional Limits LUE Assessment LUE Assessment: Exceptions to WFL LUE AROM (degrees) LUE Overall AROM Comments: shou. flex 0-30 degrees  with limitations due to ICD  See FIM for current functional status  Leroy Libman 06/15/2012, 9:01 AM

## 2012-06-15 NOTE — Progress Notes (Signed)
Physical Therapy Session Note  Patient Details  Name: [name] MRN: [id number] Date of Birth: 30-Nov-1940  Today's Date: 06/15/2012 Time: [id number] Time Calculation (min): 59 min  Short Term Goals: Week 1:  PT Short Term Goal 1 (Week 1): = LTG secondary to short LOS  Skilled Therapeutic Interventions/Progress Updates:    Ambulation 2 x 150' with RW with modified independence. 6" steps with one hand hold assist, heavy min assist for ascent secondary to weakness 2 x 4 steps. Cues for sequencing and proximity of feet to step prior to starting task. Curb step with supervision x 8 reps, demonstration prior to performance. Cues needed for placing feet close to edge prior to moving RW up/down curb. Balance activities with cones and ladder drills for dynamic balance working single limb stance and varied step directions/widths. Squats 3 x 10 reps at edge of mat for generalized strengthening. Squat + side stepping for balance and strengthening. Berg Balance test, results below.   Therapy Documentation Precautions:  Precautions Precautions: Fall;ICD/Pacemaker Precaution Comments: LUE DVT, on heparin, thoracic and lumbar compression fractures, AROM restrictions secondary to ICD placement Restrictions Weight Bearing Restrictions: No Other Position/Activity Restrictions:  (no lifting or ROM past 90 degrees) Pain: Pain Assessment Pain Assessment: No/denies pain  Balance: Berg Balance Test Sit to Stand: Able to stand  independently using hands Standing Unsupported: Able to stand safely 2 minutes Sitting with Back Unsupported but Feet Supported on Floor or Stool: Able to sit safely and securely 2 minutes Stand to Sit: Sits safely with minimal use of hands Transfers: Able to transfer safely, definite need of hands Standing Unsupported with Eyes Closed: Able to stand 10 seconds safely Standing Ubsupported with Feet Together: Able to place feet together independently and stand 1 minute safely From  Standing, Reach Forward with Outstretched Arm: Can reach forward >5 cm safely (2") From Standing Position, Pick up Object from Floor: Able to pick up shoe, needs supervision From Standing Position, Turn to Look Behind Over each Shoulder: Looks behind from both sides and weight shifts well Turn 360 Degrees: Able to turn 360 degrees safely in 4 seconds or less Standing Unsupported, Alternately Place Feet on Step/Stool: Able to stand independently and safely and complete 8 steps in 20 seconds Standing Unsupported, One Foot in Front: Able to plae foot ahead of the other independently and hold 30 seconds Standing on One Leg: Able to lift leg independently and hold equal to or more than 3 seconds Total Score: 48   See FIM for current functional status  Therapy/Group: Individual Therapy  [name] 06/15/2012, 11:48 AM

## 2012-06-15 NOTE — Progress Notes (Addendum)
ANTICOAGULATION CONSULT NOTE - Follow Up Consult  Pharmacy Consult for coumadin Indication: atrial fibrillation  No Known Allergies  Patient Measurements: Weight: 157 lb 8 oz (71.442 kg) Heparin Dosing Weight:   Vital Signs: Temp: 98.3 F (36.8 C) (04/10 0630) Temp src: Oral (04/10 0630) BP: 120/47 mmHg (04/10 0630) Pulse Rate: 82 (04/10 0630)  Labs:  Recent Labs  06/13/12 0620 06/14/12 0600 06/15/12 0555  LABPROT 22.8* 25.7* 27.0*  INR 2.11* 2.48* 2.65*    The CrCl is unknown because both a height and weight (above a minimum accepted value) are required for this calculation.   Medications:  Scheduled:  . amiodarone  200 mg Oral Daily  . carvedilol  6.25 mg Oral BID WC  . feeding supplement  237 mL Oral BID BM  . insulin aspart  0-9 Units Subcutaneous TID WC  . insulin glargine  10 Units Subcutaneous Q24H  . losartan  25 mg Oral Daily  . metFORMIN  500 mg Oral BID WC  . pantoprazole  40 mg Oral Daily  . polyethylene glycol  17 g Oral Daily  . [COMPLETED] warfarin  3 mg Oral ONCE-1800  . Warfarin - Pharmacist Dosing Inpatient   Does not apply q1800   Infusions:    Assessment: 72 yo female with afib is currently on therapeutic coumadin.  INR today is 2.65. On amiodarone. Goal of Therapy:  INR 2-3    Plan:  1) Coumadin 3mg  po x1 2) INR in am if not discharged.  Mishawn Hemann, Tsz-Yin 06/15/2012,8:32 AM

## 2012-06-15 NOTE — Progress Notes (Signed)
Physical Therapy Discharge Summary  Patient Details  Name: [name] MRN: [id number] Date of Birth: [birth year]  Today's Date: 06/15/2012 Time: 1300-1330 Time Calculation (min): 30 min   Community ambulation with RW starting at supervision level with cues and discussion of risks for falling in the community, ways to avoid these. Pt progressed to modified indepenent x 300', curb step modified independent with RW. Inclines/declines brick and paved surfaces. Car transfer modified independent and pt able to manage doors. Home environment ambulation and bed mobiltiy modified independent. 4" steps x 6 with close supervision, instructed pt to have husband hold hand. Recommended pt not use rolling desk chair upon return home secondary to risk of falling off chair, pt verbalized understanding. Pt reviewed educated materials via teach back and pt reports no further questions.   Patient has met 8 of 8 long term goals due to improved activity tolerance, improved balance, increased strength and ability to compensate for deficits.  Patient to discharge at an ambulatory level Modified Independent.   Patient's care partner is independent to provide the necessary physical assistance at discharge.  Reasons goals not met: NA  Recommendation:  Patient will benefit from ongoing skilled PT services in outpatient setting to continue to advance safe functional mobility, address ongoing impairments in increased need for assistive device, decreased functional level as compared to baseline, impaired balance particularly without device, decreased endurance, decreased bil. LE strength particularly evident with step ups or low chairs, and minimize fall risk.  Equipment: RW  Reasons for discharge: treatment goals met and discharge from hospital  Patient/family agrees with progress made and goals achieved: Yes  PT Discharge Precautions/Restrictions Precautions Precautions: Fall;ICD/Pacemaker Precaution Comments: LUE  DVT, on heparin, thoracic and lumbar compression fractures, AROM restrictions secondary to ICD placement Restrictions Weight Bearing Restrictions: No Vital Signs Therapy Vitals Temp: 98.4 F (36.9 C) Temp src: Oral Pulse Rate: 79 Resp: 20 BP: 124/53 mmHg Patient Position, if appropriate: Sitting Oxygen Therapy SpO2: 99 % O2 Device: None (Room air) Pain Pain Assessment Pain Assessment: No/denies pain  Cognition Overall Cognitive Status: Appears within functional limits for tasks assessed Arousal/Alertness: Awake/alert Orientation Level: Oriented X4 Attention: Sustained Sustained Attention: Appears intact Sensation Sensation Light Touch: Appears Intact Stereognosis: Appears Intact Hot/Cold: Appears Intact Proprioception: Appears Intact  Mobility Bed Mobility Bed Mobility: Sit to Supine Supine to Sit: 6: Modified independent (Device/Increase time) Sitting - Scoot to Edge of Bed: 6: Modified independent (Device/Increase time) Sit to Supine: 6: Modified independent (Device/Increase time) Transfers Sit to Stand: 6: Modified independent (Device/Increase time) Stand to Sit: 6: Modified independent (Device/Increase time) Stand Pivot Transfers: 6: Modified independent (Device/Increase time) Locomotion  Ambulation Ambulation: Yes Ambulation/Gait Assistance: 6: Modified independent (Device/Increase time) Ambulation Distance (Feet): 200 Feet Assistive device: Rolling walker Gait Gait: Yes Gait Pattern: Impaired Gait Pattern: Decreased stride length;Trunk flexed (decreased foot clearance) High Level Ambulation High Level Ambulation: Side stepping;Backwards walking;Direction changes;Head turns Side Stepping: modified independent Backwards Walking: modified independent Direction Changes: modified independent Head Turns: modified independent Stairs / Additional Locomotion Stairs: Yes Stairs Assistance: 4: Min assist Stairs Assistance Details (indicate cue type and reason):  one hand hold assist as pt does not have railing at home, pt able to ascend 4" steps (as she has at home) with only min assist. Higher steps require more assistance.  Stair Management Technique: Step to pattern;Forwards Number of Stairs: 6 Ramp: 6: Modified independent (Device) Curb: 6: Modified independent (Device/increase time) Wheelchair Mobility Wheelchair Mobility: No  Trunk/Postural Assessment  Cervical Assessment Cervical  Assessment: Within Functional Limits Thoracic Assessment Thoracic Assessment: Exceptions to [facility] (significant kyphosis) Thoracic AROM Overall Thoracic AROM: Deficits (fixed kyphosis) Lumbar Assessment Lumbar Assessment: Exceptions to [facility] Lumbar AROM Overall Lumbar AROM: Deficits  Balance  See Berg Balance Test from previous session: 48/56 Extremity Assessment      RLE Assessment RLE Assessment: Exceptions to [facility] RLE Strength RLE Overall Strength: Deficits RLE Overall Strength Comments: Generalized weakness: 3+/5 hip flexion, 4/5 knee flexion, extension and ankle DF (functionally demonstrating improved strength) LLE Assessment LLE Assessment: Exceptions to [facility] LLE Strength LLE Overall Strength: Deficits LLE Overall Strength Comments: Generalized weakness: 3+/5 hip flexion, 4/5 knee flexion, extension and ankle DF (functionally demosntrating improved strength)  See FIM for current functional status  [name] 06/15/2012, 4:04 PM

## 2012-06-16 DIAGNOSIS — I469 Cardiac arrest, cause unspecified: Secondary | ICD-10-CM

## 2012-06-16 DIAGNOSIS — R5381 Other malaise: Secondary | ICD-10-CM

## 2012-06-16 DIAGNOSIS — I4891 Unspecified atrial fibrillation: Secondary | ICD-10-CM

## 2012-06-16 LAB — PROTIME-INR
INR: 2.79 — ABNORMAL HIGH (ref 0.00–1.49)
Prothrombin Time: 28 seconds — ABNORMAL HIGH (ref 11.6–15.2)

## 2012-06-16 LAB — GLUCOSE, CAPILLARY: Glucose-Capillary: 100 mg/dL — ABNORMAL HIGH (ref 70–99)

## 2012-06-16 MED ORDER — HYDROCODONE-ACETAMINOPHEN 5-325 MG PO TABS: 1.0000 | ORAL_TABLET | ORAL | Status: DC | PRN

## 2012-06-16 MED ORDER — METFORMIN HCL ER 500 MG PO TB24: 500.0000 mg | ORAL_TABLET | Freq: Two times a day (BID) | ORAL | Status: DC

## 2012-06-16 MED ORDER — METFORMIN HCL 500 MG PO TABS: 1000.0000 mg | ORAL_TABLET | Freq: Two times a day (BID) | ORAL | Status: DC

## 2012-06-16 MED ORDER — WARFARIN SODIUM 3 MG PO TABS
3.0000 mg | ORAL_TABLET | Freq: Once | ORAL | Status: DC
  Filled 2012-06-16: qty 1

## 2012-06-16 MED ORDER — LOSARTAN POTASSIUM 25 MG PO TABS: 25.0000 mg | ORAL_TABLET | Freq: Every day | ORAL | Status: DC

## 2012-06-16 MED ORDER — AMIODARONE HCL 200 MG PO TABS: 200.0000 mg | ORAL_TABLET | Freq: Every day | ORAL | Status: DC

## 2012-06-16 MED ORDER — WARFARIN SODIUM 3 MG PO TABS: 3.0000 mg | ORAL_TABLET | Freq: Once | ORAL | Status: DC

## 2012-06-16 NOTE — Progress Notes (Signed)
Social Work  Discharge Note  The overall goal for the admission was met for:   Discharge location: Yes - home with husband   Length of Stay: Yes - 7 days  Discharge activity level: Yes  Home/community participation: Yes  Services provided included: MD, RD, PT, OT, RN, TR, Pharmacy and SW  Financial Services: Medicare and Private Insurance: [place]  Follow-up services arranged: Outpatient: PT via [facility], DME: tub seat via [facility] and Patient/Family has no preference for HH/DME agencies  Comments (or additional information):  Patient/Family verbalized understanding of follow-up arrangements: Yes  Individual responsible for coordination of the follow-up plan: patient  Confirmed correct DME delivered: Caree Wolpert 06/16/2012    Akin Yi

## 2012-06-16 NOTE — Plan of Care (Signed)
Problem: RH BOWEL ELIMINATION Goal: RH STG MANAGE BOWEL WITH ASSISTANCE STG Manage Bowel with min Assistance.  Outcome: Completed/Met Date Met:  06/16/12 Mod independent at discharge   Problem: RH SKIN INTEGRITY Goal: RH STG SKIN FREE OF INFECTION/BREAKDOWN Area to right buttock will heal with min assistance  Outcome: Completed/Met Date Met:  06/16/12 Husband able to apply skin protectant and buttocks healing no bleeding remains excoriated

## 2012-06-16 NOTE — Progress Notes (Signed)
Patient discharged to home at 1115 with all belongings . Discharge instructions given and reviewed by [address] [place]. Patient and husband able to verbalize understanding of discharge instructions . Continue with plan of care.      [name]

## 2012-06-16 NOTE — Progress Notes (Signed)
Patient ID: [name], female   DOB: [birth year], 72 y.o.   MRN: [id number] No new problems. Anxious to go home.  Plans on attending outpt PT/OT Subjective/Complaints: A 12 point review of systems has been performed and if not noted above is otherwise negative.  Objective: Vital Signs: Blood pressure 122/60, pulse 85, temperature 97.7 F (36.5 C), temperature source Oral, resp. rate 18, weight 72.349 kg (159 lb 8 oz), SpO2 96.00%. No results found. Results for orders placed during the hospital encounter of 06/09/12 (from the past 72 hour(s))  GLUCOSE, CAPILLARY     Status: Abnormal   Collection Time    06/13/12 11:22 AM      Result Value Range   Glucose-Capillary 122 (*) 70 - 99 mg/dL   Comment 1 Notify RN    GLUCOSE, CAPILLARY     Status: None   Collection Time    06/13/12  4:35 PM      Result Value Range   Glucose-Capillary 97  70 - 99 mg/dL   Comment 1 Notify RN    GLUCOSE, CAPILLARY     Status: Abnormal   Collection Time    06/13/12  9:03 PM      Result Value Range   Glucose-Capillary 141 (*) 70 - 99 mg/dL   Comment 1 Notify RN    PROTIME-INR     Status: Abnormal   Collection Time    06/14/12  6:00 AM      Result Value Range   Prothrombin Time 25.7 (*) 11.6 - 15.2 seconds   INR 2.48 (*) 0.00 - 1.49  GLUCOSE, CAPILLARY     Status: Abnormal   Collection Time    06/14/12  7:32 AM      Result Value Range   Glucose-Capillary 102 (*) 70 - 99 mg/dL   Comment 1 Notify RN    GLUCOSE, CAPILLARY     Status: Abnormal   Collection Time    06/14/12  4:22 PM      Result Value Range   Glucose-Capillary 107 (*) 70 - 99 mg/dL   Comment 1 Notify RN    GLUCOSE, CAPILLARY     Status: Abnormal   Collection Time    06/14/12  9:03 PM      Result Value Range   Glucose-Capillary 129 (*) 70 - 99 mg/dL   Comment 1 Notify RN     Comment 2 Orig Pt ID [id number]    PROTIME-INR     Status: Abnormal   Collection Time    06/15/12  5:55 AM      Result Value Range   Prothrombin Time 27.0 (*)  11.6 - 15.2 seconds   INR 2.65 (*) 0.00 - 1.49  GLUCOSE, CAPILLARY     Status: Abnormal   Collection Time    06/15/12  7:14 AM      Result Value Range   Glucose-Capillary 109 (*) 70 - 99 mg/dL   Comment 1 Notify RN    GLUCOSE, CAPILLARY     Status: None   Collection Time    06/15/12 11:44 AM      Result Value Range   Glucose-Capillary 82  70 - 99 mg/dL  GLUCOSE, CAPILLARY     Status: None   Collection Time    06/15/12  4:34 PM      Result Value Range   Glucose-Capillary 79  70 - 99 mg/dL  GLUCOSE, CAPILLARY     Status: Abnormal   Collection Time    06/15/12  8:55 PM      Result Value Range   Glucose-Capillary 168 (*) 70 - 99 mg/dL   Comment 1 Notify RN    PROTIME-INR     Status: Abnormal   Collection Time    06/16/12  6:37 AM      Result Value Range   Prothrombin Time 28.0 (*) 11.6 - 15.2 seconds   INR 2.79 (*) 0.00 - 1.49  GLUCOSE, CAPILLARY     Status: Abnormal   Collection Time    06/16/12  6:59 AM      Result Value Range   Glucose-Capillary 100 (*) 70 - 99 mg/dL   Comment 1 Notify RN       HEENT: normal Cardio: RRR and no murmur Resp: CTA B/L and unlabored GI: BS positive and non tender Extremity:  No Edema Skin:   Intact and Wound C/D/I. No edema. Appropriately tender. Has a small, now moist, denuded area around right buttock which measures less than cm wide by 2.5 cm long Neuro: Alert/Oriented, Normal Sensory and Abnormal Motor 3-/5 Left delt due to restrictions otherwise 4/5 Musc/Skel:  Extremity tender mild tenderness Left shoulder Gen NAD   Assessment/Plan: 1. Functional deficits secondary to Deconditioning related to out of hospital V fib arrest which require 3+ hours per day of interdisciplinary therapy in a comprehensive inpatient rehab setting. Stable for D/C today F/u PCP in 1-2 weeks  F/U cardiology See D/C summary See D/C instructions FIM: FIM - Bathing Bathing Steps Patient Completed: Chest;Right upper leg;Right Arm;Buttocks;Left Arm;Left  upper leg;Right lower leg (including foot);Abdomen;Front perineal area;Left lower leg (including foot) Bathing: 6: More than reasonable amount of time  FIM - Upper Body Dressing/Undressing Upper body dressing/undressing steps patient completed: Thread/unthread right sleeve of pullover shirt/dresss;Thread/unthread left sleeve of pullover shirt/dress;Put head through opening of pull over shirt/dress;Pull shirt over trunk Upper body dressing/undressing: 7: Complete Independence: No helper FIM - Lower Body Dressing/Undressing Lower body dressing/undressing steps patient completed: Thread/unthread right underwear leg;Pull underwear up/down;Pull pants up/down;Thread/unthread left pants leg;Thread/unthread right pants leg;Fasten/unfasten pants;Don/Doff right shoe;Fasten/unfasten left shoe;Don/Doff left shoe;Don/Doff right sock;Thread/unthread left underwear leg;Don/Doff left sock Lower body dressing/undressing: 6: Assistive device (Comment)  FIM - Toileting Toileting steps completed by patient: Adjust clothing prior to toileting;Performs perineal hygiene;Adjust clothing after toileting Toileting: 6: Assistive device: No helper  FIM - Radio producer Devices: Elevated toilet seat Toilet Transfers: 6-Assistive device: No helper;6-More than reasonable amt of time;6-From toilet/BSC  FIM - Control and instrumentation engineer Devices: Bed rails Bed/Chair Transfer: 6: More than reasonable amt of time;6: Sit > Supine: No assist;6: Chair or W/C > Bed: No assist;6: Bed > Chair or W/C: No assist  FIM - Locomotion: Wheelchair Distance: 150 (LUE restricted use after ICD) Locomotion: Wheelchair: 0: Activity did not occur (not primary means of locomotion) FIM - Locomotion: Ambulation Locomotion: Ambulation Assistive Devices: Administrator Ambulation/Gait Assistance: 6: Modified independent (Device/Increase time) Locomotion: Ambulation: 6: Travels 150 ft or more with  assistive device/no helper  Comprehension Comprehension Mode: Auditory Comprehension: 5-Understands complex 90% of the time/Cues < 10% of the time  Expression Expression Mode: Verbal Expression: 7-Expresses complex ideas: With no assist  Social Interaction Social Interaction: 7-Interacts appropriately with others - No medications needed.  Problem Solving Problem Solving: 6-Solves complex problems: With extra time  Memory Memory: 6-More than reasonable amt of time  Medical Problem List and Plan:  1. deconditioning after cardiac arrest status post ICD placement, respiratory failure, multi-medical issues  2. DVT Prophylaxis/Anticoagulation: Chronic  Coumadin therapy. INR 1.95 06/09/2012  3. Pain Management: tylenol,hydrocodone as needed.  -pain under fair control at present.  4. Neuropsych: This patient is capable of making decisions on her own behalf.  5. Atrial fibrillation/bradycardia. Status post ICD placement 06/09/2012. Amiodarone 200 mg daily, coreg 6.25 mg twice a day, Cozaar 25 mg daily.  -discussed low DBP with patient. Given cardiac issues, I would prefer to leave her meds as is for now as she's not symptomatic and her SBP is good. 6. Diabetes mellitus with peripheral neuropathy. Latest hemoglobin A1c 6.2. Lantus insulin 10 units daily. Patient on Glucophage 500 pta--  -added glucophage to regimen with good results thus far 7. Chronic anemia: up to 9.5  ---continue to follow 8. Constipation:  Moved bowels over weekend. 9. GERD: protonix 10. Pressure sore: continue pressure relief measures. Area looks intact and should respond to conservative measures   LOS (Days) 7 A FACE TO FACE EVALUATION WAS PERFORMED  Colin Norment E 06/16/2012, 9:41 AM

## 2012-06-16 NOTE — Progress Notes (Addendum)
ANTICOAGULATION CONSULT NOTE - Follow Up Consult  Pharmacy Consult for coumadin Indication: atrial fibrillation  No Known Allergies  Patient Measurements: Weight: 159 lb 8 oz (72.349 kg) Heparin Dosing Weight:   Vital Signs: Temp: 97.7 F (36.5 C) (04/11 0700) Temp src: Oral (04/11 0700) BP: 122/60 mmHg (04/11 0700) Pulse Rate: 85 (04/11 0700)  Labs:  Recent Labs  06/14/12 0600 06/15/12 0555 06/16/12 0637  LABPROT 25.7* 27.0* 28.0*  INR 2.48* 2.65* 2.79*    The CrCl is unknown because both a height and weight (above a minimum accepted value) are required for this calculation.   Medications:  Scheduled:  . amiodarone  200 mg Oral Daily  . carvedilol  6.25 mg Oral BID WC  . feeding supplement  237 mL Oral BID BM  . insulin aspart  0-9 Units Subcutaneous TID WC  . insulin glargine  10 Units Subcutaneous Q24H  . losartan  25 mg Oral Daily  . metFORMIN  500 mg Oral BID WC  . pantoprazole  40 mg Oral Daily  . polyethylene glycol  17 g Oral Daily  . [COMPLETED] warfarin  3 mg Oral ONCE-1800  . Warfarin - Pharmacist Dosing Inpatient   Does not apply q1800   Infusions:    Assessment: 72 yo female with afib is currently on therapeutic coumadin. INR today is 2.79.  On amiodarone. Goal of Therapy:  INR 2-3    Plan:  1) Coumadin 3mg  po x1 2) INR in am if not discharged today  Lucas Winograd, Tsz-Yin 06/16/2012,8:41 AM

## 2012-06-18 ENCOUNTER — Other Ambulatory Visit: Payer: Self-pay | Admitting: Cardiology

## 2012-06-18 ENCOUNTER — Telehealth: Payer: Self-pay | Admitting: Cardiology

## 2012-06-18 MED ORDER — CARVEDILOL 6.25 MG PO TABS: 6.2500 mg | ORAL_TABLET | Freq: Two times a day (BID) | ORAL | Status: DC

## 2012-06-18 NOTE — Telephone Encounter (Signed)
[name] called to report "racing" palpitations that started approximately 45 minutes ago. She checked her BP and pulse at home, noted her pulse rate was 150 bpm. SBP 130. She denies CP, SOB, dizziness, near syncope or syncope. She denies ICD shock. She states her palpitations feel similar to what she has experienced in the past with AF. She was just discharged from [facility] on Friday, 06/16/2012. Please see recent progress notes/DC summary from her prolonged hospitalization. I have reviewed her medication list with her in detail. She reports compliance with amiodarone but states she was not given Coreg at the time of discharge from [facility]. I conferred with Dr. Haroldine Laws and recommendations given as follows. I sent prescription for Coreg 6.25 mg PO BID to [name]. She was instructed to restart Coreg. She will check her BP and pulse once every hour. If her pulse remains >110 bpm consistently or should she develop worsening palpitations or other symptoms, she was advised to come in to the [facility] ED for evaluation. If her pulse rate improves, she will call our office in the morning for further instructions and follow-up with ECG. [name] expressed verbal understanding and agrees with this plan of care.

## 2012-06-19 ENCOUNTER — Telehealth: Payer: Self-pay | Admitting: Internal Medicine

## 2012-06-19 ENCOUNTER — Ambulatory Visit (INDEPENDENT_AMBULATORY_CARE_PROVIDER_SITE_OTHER): Payer: Medicare Other | Admitting: Pharmacist

## 2012-06-19 DIAGNOSIS — I059 Rheumatic mitral valve disease, unspecified: Secondary | ICD-10-CM

## 2012-06-19 DIAGNOSIS — I359 Nonrheumatic aortic valve disorder, unspecified: Secondary | ICD-10-CM

## 2012-06-19 DIAGNOSIS — I4891 Unspecified atrial fibrillation: Secondary | ICD-10-CM

## 2012-06-19 DIAGNOSIS — Z7901 Long term (current) use of anticoagulants: Secondary | ICD-10-CM | POA: Diagnosis not present

## 2012-06-19 NOTE — Telephone Encounter (Signed)
New Problem:    Patient called in wanting to follow-up on her call from yesterday.  Patient reports that her heart has been racing since 5 a.m. this morning.  Please call back.

## 2012-06-19 NOTE — Telephone Encounter (Signed)
Spoke with patient and she is back in NSR now and is feeling better.  She took two extra doses of Coreg today and is still maintaing NSR at this point.  She will call back if further problems

## 2012-06-20 ENCOUNTER — Telehealth: Payer: Self-pay | Admitting: Cardiology

## 2012-06-20 ENCOUNTER — Ambulatory Visit: Payer: Medicare Other | Admitting: Physical Therapy

## 2012-06-20 DIAGNOSIS — I4891 Unspecified atrial fibrillation: Secondary | ICD-10-CM

## 2012-06-20 MED ORDER — AMIODARONE HCL 200 MG PO TABS: ORAL_TABLET | ORAL | Status: DC

## 2012-06-20 NOTE — Telephone Encounter (Signed)
Spoke with pt, she is in atrial fib again this am at 8:40am. Her rate is 144 and bp 95/66. She has taken an extra carvedilol this morning  And is waiting to see if she will go back inti sinus. She is wondering what to do next. Will discuss with dr allred.

## 2012-06-20 NOTE — Telephone Encounter (Signed)
Called by pt due to recurrent atrial fibrillation. Despite two doses of coreg, heart rate remained in the 140s for > 2 hrs. I initially recommended she come in to be evaluated in the ER for management of her afib with RVR and she agreed. Received follow-up page at 12:50 indicating that her heart rate returned to normal and she would not be coming in to the ER. Will cc Dr. Rayann Heman as Juluis Rainier (has wound check on 4/17 s/p ICD placement).

## 2012-06-20 NOTE — Telephone Encounter (Addendum)
Discussed with Dr Rayann Heman will increase her Amiodarone to 400mg  bid for one week then will decrease back to 200mg  bid and keep her follow up as scheduled

## 2012-06-20 NOTE — Telephone Encounter (Signed)
New problem    Pt has a situation she wants to discuss with you-is all the pt would say

## 2012-06-20 NOTE — Telephone Encounter (Signed)
Back in NSR at 11:45 Will still do as Dr Rayann Heman suggessted

## 2012-06-20 NOTE — Telephone Encounter (Signed)
Follow up call    Patient calling back with updates for [facility].

## 2012-06-22 ENCOUNTER — Other Ambulatory Visit: Payer: Self-pay | Admitting: Internal Medicine

## 2012-06-22 ENCOUNTER — Encounter: Payer: Self-pay | Admitting: Gastroenterology

## 2012-06-22 ENCOUNTER — Ambulatory Visit (INDEPENDENT_AMBULATORY_CARE_PROVIDER_SITE_OTHER): Payer: Medicare Other | Admitting: *Deleted

## 2012-06-22 DIAGNOSIS — I2589 Other forms of chronic ischemic heart disease: Secondary | ICD-10-CM

## 2012-06-22 DIAGNOSIS — I255 Ischemic cardiomyopathy: Secondary | ICD-10-CM

## 2012-06-22 DIAGNOSIS — I509 Heart failure, unspecified: Secondary | ICD-10-CM | POA: Diagnosis not present

## 2012-06-22 LAB — ICD DEVICE OBSERVATION
AL AMPLITUDE: 2.8 mv
AL IMPEDENCE ICD: 400 Ohm
BAMS-0001: 150 {beats}/min
FVT: 0
HV IMPEDENCE: 41 Ohm
RV LEAD AMPLITUDE: 12 mv
RV LEAD IMPEDENCE ICD: 512.5 Ohm
TOT-0007: 1
TOT-0008: 0
TOT-0009: 1
TOT-0010: 1
TZAT-0001SLOWVT: 1
TZAT-0004SLOWVT: 8
TZAT-0018SLOWVT: NEGATIVE
TZAT-0019SLOWVT: 7.5 V
TZAT-0020SLOWVT: 1 ms
TZON-0003SLOWVT: 300 ms
TZON-0004SLOWVT: 30
TZON-0005SLOWVT: 6
TZON-0010SLOWVT: 40 ms
TZST-0001SLOWVT: 3
TZST-0001SLOWVT: 4
TZST-0003SLOWVT: 25 J
VENTRICULAR PACING ICD: 0 pct
VF: 0

## 2012-06-22 LAB — BASIC METABOLIC PANEL
CO2: 29 mEq/L (ref 19–32)
Calcium: 8.9 mg/dL (ref 8.4–10.5)
Creatinine, Ser: 1 mg/dL (ref 0.4–1.2)
GFR: 56.76 mL/min — ABNORMAL LOW (ref >60.00)
Glucose, Bld: 123 mg/dL — ABNORMAL HIGH (ref 70–99)
Sodium: 142 mEq/L (ref 135–145)

## 2012-06-22 NOTE — Progress Notes (Signed)
Wound check-ICD 

## 2012-06-23 ENCOUNTER — Telehealth: Payer: Self-pay | Admitting: Internal Medicine

## 2012-06-23 NOTE — Telephone Encounter (Signed)
New Prob     Pt is calling in wanting to to nurse regarding medication she would like to be changed (PACERONE). She said this medication is giving her some issues.

## 2012-06-23 NOTE — Telephone Encounter (Signed)
**Note De-Identified Elke Holtry Obfuscation** Rx for Amiodarone was sent to [facility] on 4/15. Pharmacist at [facility] states that they did receive RX but pts insurance will not pay until April 26 (when pt was d/c'd from [facility]. RX for Amiodarone was written for 200 mg daily and should have been 200 mg bid). She states she will contact insurance company to let them know there has been a dose increase per Dr. Rayann Heman and that she will contact pt when she receives answer.

## 2012-06-26 ENCOUNTER — Ambulatory Visit: Payer: Medicare Other | Admitting: Internal Medicine

## 2012-06-29 ENCOUNTER — Encounter: Payer: Self-pay | Admitting: Cardiology

## 2012-06-29 ENCOUNTER — Ambulatory Visit (INDEPENDENT_AMBULATORY_CARE_PROVIDER_SITE_OTHER): Payer: Medicare Other | Admitting: Pharmacist

## 2012-06-29 ENCOUNTER — Ambulatory Visit (INDEPENDENT_AMBULATORY_CARE_PROVIDER_SITE_OTHER): Payer: Medicare Other | Admitting: Cardiology

## 2012-06-29 VITALS — BP 121/70 | HR 73 | Ht 63.0 in | Wt 155.0 lb

## 2012-06-29 DIAGNOSIS — I4891 Unspecified atrial fibrillation: Secondary | ICD-10-CM

## 2012-06-29 DIAGNOSIS — I10 Essential (primary) hypertension: Secondary | ICD-10-CM

## 2012-06-29 DIAGNOSIS — R0989 Other specified symptoms and signs involving the circulatory and respiratory systems: Secondary | ICD-10-CM

## 2012-06-29 DIAGNOSIS — I359 Nonrheumatic aortic valve disorder, unspecified: Secondary | ICD-10-CM

## 2012-06-29 DIAGNOSIS — I2589 Other forms of chronic ischemic heart disease: Secondary | ICD-10-CM

## 2012-06-29 DIAGNOSIS — I059 Rheumatic mitral valve disease, unspecified: Secondary | ICD-10-CM

## 2012-06-29 DIAGNOSIS — I469 Cardiac arrest, cause unspecified: Secondary | ICD-10-CM

## 2012-06-29 DIAGNOSIS — I255 Ischemic cardiomyopathy: Secondary | ICD-10-CM

## 2012-06-29 DIAGNOSIS — Z7901 Long term (current) use of anticoagulants: Secondary | ICD-10-CM

## 2012-06-29 DIAGNOSIS — I38 Endocarditis, valve unspecified: Secondary | ICD-10-CM

## 2012-06-29 LAB — PROTIME-INR: INR: 7.1 ratio (ref 0.8–1.0)

## 2012-06-29 MED ORDER — CARVEDILOL 12.5 MG PO TABS: 12.5000 mg | ORAL_TABLET | Freq: Two times a day (BID) | ORAL | Status: DC

## 2012-06-29 NOTE — Assessment & Plan Note (Signed)
Patient is in sinus rhythm on examination today. Continue Coumadin. She was having increased bouts following discharge from rehabilitation. Her amiodarone was increased. I will continue at 200 mg twice a day for 2 weeks and then resume 200 mg daily. When she returns in 8 weeks we will check TSH, liver functions and chest x-ray.

## 2012-06-29 NOTE — Assessment & Plan Note (Signed)
Continue ARB.increase Coreg to 12.5 mg by mouth twice a day.

## 2012-06-29 NOTE — Assessment & Plan Note (Signed)
Continue SBE prophylaxis. 

## 2012-06-29 NOTE — Assessment & Plan Note (Signed)
Patient now with ICD in place.management per electrophysiology.

## 2012-06-29 NOTE — Progress Notes (Signed)
HPI: Pleasant female with history of severe AS, mitral regurgitation, aneurysm of the ascending thoracic aorta (status post aortic valve replacement, mitral valve repair, resection and grafting of the ascending thoracic aortic aneurysm on Jul 07, 2006). History of paroxysmal atrial fibrillation (status post maze procedure on Jul 07, 2006). Followup catheterization performed secondary to elevated troponin and recurrent atrial fibrillation revealed an ejection fraction of 25-30% and occlusion of the reimplanted left main. The patient subsequently underwent redo coronary artery bypass and graft with a LIMA to the LAD and a saphenous vein graft to the OM. She also had an epicardial lead placed if she required biventricular pacing in the future. A followup echocardiogram was performed in October of 2013. Her ejection fraction was 35-40%. There was mild perivalvular aortic insufficiency. There was mild mitral stenosis and mild residual mitral regurgitation. There was mild to moderate tricuspid regurgitation. Mild biatrial enlargement. MUGA was performed in October of 2013 and showed an ejection fraction of 35%. Because patient's LV function was reduced a Myoview was performed. There was a fixed inferior defect consistent with thinning and a partially reversible anterior defect consistent with prior infarct and mild peri-infarct ischemia. Ejection fraction 33%, global hypokinesis and anteroseptal akinesis. Cardiac catheterization repeated in January of 2014. The left main was occluded. The LAD, circumflex and right coronary artery were patent. The LIMA to the LAD was patent but atretic. Saphenous vein graft to the obtuse marginal was patent. This graft filled the obtuse marginal and ramus intermedius and the entire LAD filled retrograde from this graft. Ejection fraction was 35%. Patient referred to Dr. Rayann Heman for consideration of ICD; also placed on tikosyn for atrial fibrillation. Patient then admitted in March of 2014  following a ventricular fibrillation arrest. She required a prolonged resuscitation. Her QT was prolonged and her tikosyn DCed. Her hospital course was complicated by a DVT in her left arm. She ultimately had a [employer] ICD implanted. Amiodarone initiated. She then was discharged to rehabilitation. Since she was discharged home, the patient denies any dyspnea on exertion, orthopnea, PND, pedal edema, palpitations, syncope or chest pain. She has some residual weakness from her recent hospitalization. She did have bouts of atrial fibrillation and Dr. Rayann Heman increased her amiodarone.    Current Outpatient Prescriptions  Medication Sig Dispense Refill  . amiodarone (PACERONE) 200 MG tablet 1 tab po  bid      . carvedilol (COREG) 6.25 MG tablet Take 1 tablet (6.25 mg total) by mouth 2 (two) times daily.  60 tablet  3  . Cholecalciferol (VITAMIN D3) 50000 UNITS CAPS Take 1 tablet by mouth once a week.  12 capsule  3  . Cyanocobalamin (VITAMIN B-12) 2500 MCG SUBL Place 1 tablet under the tongue 3 (three) times a week. Dissolves 1 tablet under the tongue on Monday, Wednesday, and Friday      . furosemide (LASIX) 20 MG tablet Take 20 mg by mouth 2 (two) times daily.      Marland Kitchen HYDROcodone-acetaminophen (NORCO/VICODIN) 5-325 MG per tablet Take 1-2 tablets by mouth every 4 (four) hours as needed.  60 tablet  1  . losartan (COZAAR) 25 MG tablet Take 1 tablet (25 mg total) by mouth daily.  30 tablet  1  . metFORMIN (GLUCOPHAGE-XR) 500 MG 24 hr tablet Take by mouth. Takes 1 tablet in am and 3 tablets in pm      . omeprazole (PRILOSEC OTC) 20 MG tablet Take 20 mg by mouth daily after breakfast.       .  warfarin (COUMADIN) 3 MG tablet Take 3 mg by mouth as directed.       No current facility-administered medications for this visit.     Past Medical History  Diagnosis Date  . Anemia   . Atrial fibrillation     A.fib/flutter: s/p MAZE 2008, DCCV 2011, on coumadin; previously on flecainide, but dc'd due to  worsening EF. Tikosyn discontinued 06/2012 after cardiac arrest.  . Diverticulitis   . CVA (cerebral vascular accident)     2008 - felt due to oscillating calcium on aortic valve  . Carcinoma in situ of vulva   . Fibroid   . Ovarian cyst, left 2008-2009  . Osteoporosis   . Diabetes mellitus   . Hypertension   . GERD (gastroesophageal reflux disease)   . Chronic systolic CHF (congestive heart failure)     EF 35%  . Ischemic cardiomyopathy     EF 35%  . Coronary artery disease     Occluded LM 2/2 previous aortic root surgery; s/p 2v CABG 2010 LIMA to LAD, SVG to OM; Cath 03/11/12 patent SVG to OM, atretic LIMA to LAD, patent RCA, occluded native LM, EF 35%   . Arthritis     OSTEO  . Fracture of lumbar spine   . 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  . Superficial venous thrombosis of upper extremity     06/2012 - LUE  . Severe aortic stenosis     s/p homograft aortic root replacement 2008  . Mitral regurgitation     s/p mitral valve repair 2008  . Thoracic aortic aneurysm     s/p resection and grafting 2008    Past Surgical History  Procedure Laterality Date  . Foot surgery      removed neuroma  . Aortic root replacement  2008    homograft  . Mv repair  2008    due to severe MR  . Resection and grafting of ascending thoracic aortic    . Aneurysm and left sided maze  2008  . Coronary artery bypass graft  4/10    LIMA to LAD, SVG to OM  . Wide excision of vulva      CA INSITU    History   Social History  . Marital Status: Married    Spouse Name: N/A    Number of Children: N/A  . Years of Education: N/A   Occupational History  . Retired Radiation protection practitioner    Social History Main Topics  . Smoking status: Former Smoker    Quit date: 03/09/1983  . Smokeless tobacco: Never Used  . Alcohol Use: No  . Drug Use: No  .  Sexually Active: Not Currently    Birth Control/ Protection: Post-menopausal   Other Topics Concern  . Not on file   Social History Narrative    Her mother died at age 68 from a stroke.  Her father died at age 76, had chronic obstructive pulmonary disease and colon disease.   No regular exercise    ROS: no fevers or chills, productive cough, hemoptysis, dysphasia, odynophagia, melena, hematochezia, dysuria, hematuria, rash, seizure activity, orthopnea, PND, pedal edema, claudication. Remaining systems are negative.  Physical Exam: Well-developed well-nourished in no acute distress.  Skin is warm and dry.  HEENT is normal.  Neck is supple.  Chest is clear to auscultation with normal expansion.  Cardiovascular exam is regular  rate and rhythm. 2/6 systolic murmur left sternal border. Abdominal exam nontender or distended. No masses palpated. Extremities show no edema. neuro grossly intact

## 2012-06-29 NOTE — Patient Instructions (Addendum)
Your physician recommends that you schedule a follow-up appointment in: [place] CARVEDILOL TO 12.5 MG TWICE DAILY  AFTER 2 WEEKS DECREASE AMIODARONE TO 200 MG ONCE DAILY

## 2012-06-29 NOTE — Assessment & Plan Note (Signed)
Continue present medications. 

## 2012-06-30 ENCOUNTER — Ambulatory Visit: Payer: Medicare Other | Attending: Physical Medicine & Rehabilitation | Admitting: Physical Therapy

## 2012-06-30 DIAGNOSIS — R5381 Other malaise: Secondary | ICD-10-CM | POA: Insufficient documentation

## 2012-06-30 DIAGNOSIS — IMO0001 Reserved for inherently not codable concepts without codable children: Secondary | ICD-10-CM | POA: Insufficient documentation

## 2012-06-30 DIAGNOSIS — R269 Unspecified abnormalities of gait and mobility: Secondary | ICD-10-CM | POA: Diagnosis not present

## 2012-07-04 ENCOUNTER — Telehealth: Payer: Self-pay | Admitting: Cardiology

## 2012-07-04 NOTE — Telephone Encounter (Signed)
Spoke with pt, questions regarding carvedilol and amiodarone answered.

## 2012-07-04 NOTE — Telephone Encounter (Signed)
New Prob    Pt has some questions regarding CARVEDILOL. Would like to speak to nurse.

## 2012-07-05 ENCOUNTER — Ambulatory Visit: Payer: Medicare Other | Admitting: *Deleted

## 2012-07-05 DIAGNOSIS — IMO0001 Reserved for inherently not codable concepts without codable children: Secondary | ICD-10-CM | POA: Diagnosis not present

## 2012-07-05 DIAGNOSIS — R5381 Other malaise: Secondary | ICD-10-CM | POA: Diagnosis not present

## 2012-07-05 DIAGNOSIS — R269 Unspecified abnormalities of gait and mobility: Secondary | ICD-10-CM | POA: Diagnosis not present

## 2012-07-06 ENCOUNTER — Ambulatory Visit (INDEPENDENT_AMBULATORY_CARE_PROVIDER_SITE_OTHER): Payer: Medicare Other | Admitting: Pharmacist

## 2012-07-06 DIAGNOSIS — I059 Rheumatic mitral valve disease, unspecified: Secondary | ICD-10-CM

## 2012-07-06 DIAGNOSIS — 419620001 Death: Secondary | SNOMED CT

## 2012-07-06 DIAGNOSIS — Z7901 Long term (current) use of anticoagulants: Secondary | ICD-10-CM

## 2012-07-06 DIAGNOSIS — I359 Nonrheumatic aortic valve disorder, unspecified: Secondary | ICD-10-CM | POA: Diagnosis not present

## 2012-07-06 DIAGNOSIS — I4891 Unspecified atrial fibrillation: Secondary | ICD-10-CM

## 2012-07-06 LAB — POCT INR: INR: 3.5

## 2012-07-06 DEATH — deceased

## 2012-07-07 ENCOUNTER — Ambulatory Visit: Payer: Medicare Other | Attending: Physical Medicine & Rehabilitation | Admitting: *Deleted

## 2012-07-07 DIAGNOSIS — R269 Unspecified abnormalities of gait and mobility: Secondary | ICD-10-CM | POA: Diagnosis not present

## 2012-07-07 DIAGNOSIS — R5381 Other malaise: Secondary | ICD-10-CM | POA: Diagnosis not present

## 2012-07-07 DIAGNOSIS — IMO0001 Reserved for inherently not codable concepts without codable children: Secondary | ICD-10-CM | POA: Insufficient documentation

## 2012-07-10 ENCOUNTER — Ambulatory Visit: Payer: Medicare Other

## 2012-07-10 DIAGNOSIS — R5381 Other malaise: Secondary | ICD-10-CM | POA: Diagnosis not present

## 2012-07-10 DIAGNOSIS — IMO0001 Reserved for inherently not codable concepts without codable children: Secondary | ICD-10-CM | POA: Diagnosis not present

## 2012-07-10 DIAGNOSIS — R269 Unspecified abnormalities of gait and mobility: Secondary | ICD-10-CM | POA: Diagnosis not present

## 2012-07-12 ENCOUNTER — Ambulatory Visit: Payer: Medicare Other

## 2012-07-12 DIAGNOSIS — R269 Unspecified abnormalities of gait and mobility: Secondary | ICD-10-CM | POA: Diagnosis not present

## 2012-07-12 DIAGNOSIS — IMO0001 Reserved for inherently not codable concepts without codable children: Secondary | ICD-10-CM | POA: Diagnosis not present

## 2012-07-12 DIAGNOSIS — R5381 Other malaise: Secondary | ICD-10-CM | POA: Diagnosis not present

## 2012-07-14 ENCOUNTER — Ambulatory Visit: Payer: Medicare Other | Admitting: *Deleted

## 2012-07-14 DIAGNOSIS — R5381 Other malaise: Secondary | ICD-10-CM | POA: Diagnosis not present

## 2012-07-14 DIAGNOSIS — R269 Unspecified abnormalities of gait and mobility: Secondary | ICD-10-CM | POA: Diagnosis not present

## 2012-07-14 DIAGNOSIS — IMO0001 Reserved for inherently not codable concepts without codable children: Secondary | ICD-10-CM | POA: Diagnosis not present

## 2012-07-17 ENCOUNTER — Ambulatory Visit (INDEPENDENT_AMBULATORY_CARE_PROVIDER_SITE_OTHER): Payer: Medicare Other

## 2012-07-17 ENCOUNTER — Ambulatory Visit: Payer: Medicare Other

## 2012-07-17 DIAGNOSIS — R5381 Other malaise: Secondary | ICD-10-CM | POA: Diagnosis not present

## 2012-07-17 DIAGNOSIS — I4891 Unspecified atrial fibrillation: Secondary | ICD-10-CM

## 2012-07-17 DIAGNOSIS — I359 Nonrheumatic aortic valve disorder, unspecified: Secondary | ICD-10-CM | POA: Diagnosis not present

## 2012-07-17 DIAGNOSIS — I059 Rheumatic mitral valve disease, unspecified: Secondary | ICD-10-CM

## 2012-07-17 DIAGNOSIS — Z7901 Long term (current) use of anticoagulants: Secondary | ICD-10-CM

## 2012-07-17 DIAGNOSIS — R269 Unspecified abnormalities of gait and mobility: Secondary | ICD-10-CM | POA: Diagnosis not present

## 2012-07-17 DIAGNOSIS — IMO0001 Reserved for inherently not codable concepts without codable children: Secondary | ICD-10-CM | POA: Diagnosis not present

## 2012-07-19 ENCOUNTER — Ambulatory Visit: Payer: Medicare Other

## 2012-07-19 DIAGNOSIS — R5381 Other malaise: Secondary | ICD-10-CM | POA: Diagnosis not present

## 2012-07-19 DIAGNOSIS — R269 Unspecified abnormalities of gait and mobility: Secondary | ICD-10-CM | POA: Diagnosis not present

## 2012-07-19 DIAGNOSIS — IMO0001 Reserved for inherently not codable concepts without codable children: Secondary | ICD-10-CM | POA: Diagnosis not present

## 2012-07-21 ENCOUNTER — Ambulatory Visit (INDEPENDENT_AMBULATORY_CARE_PROVIDER_SITE_OTHER): Payer: Medicare Other | Admitting: Internal Medicine

## 2012-07-21 ENCOUNTER — Other Ambulatory Visit: Payer: Medicare Other

## 2012-07-21 ENCOUNTER — Encounter: Payer: Self-pay | Admitting: Internal Medicine

## 2012-07-21 ENCOUNTER — Ambulatory Visit: Payer: Medicare Other

## 2012-07-21 VITALS — BP 138/82 | HR 86 | Temp 98.0°F

## 2012-07-21 DIAGNOSIS — IMO0001 Reserved for inherently not codable concepts without codable children: Secondary | ICD-10-CM | POA: Diagnosis not present

## 2012-07-21 DIAGNOSIS — R350 Frequency of micturition: Secondary | ICD-10-CM | POA: Diagnosis not present

## 2012-07-21 DIAGNOSIS — N390 Urinary tract infection, site not specified: Secondary | ICD-10-CM

## 2012-07-21 DIAGNOSIS — R269 Unspecified abnormalities of gait and mobility: Secondary | ICD-10-CM | POA: Diagnosis not present

## 2012-07-21 DIAGNOSIS — R3915 Urgency of urination: Secondary | ICD-10-CM

## 2012-07-21 DIAGNOSIS — R5381 Other malaise: Secondary | ICD-10-CM | POA: Diagnosis not present

## 2012-07-21 LAB — POCT URINALYSIS DIPSTICK
Glucose, UA: NEGATIVE
Ketones, UA: NEGATIVE
Urobilinogen, UA: 0.2

## 2012-07-21 MED ORDER — NITROFURANTOIN MONOHYD MACRO 100 MG PO CAPS: 100.0000 mg | ORAL_CAPSULE | Freq: Two times a day (BID) | ORAL | Status: DC

## 2012-07-21 NOTE — Progress Notes (Signed)
HPI  Pt presents to the clinic today with c/o urinary urgency and frequency. This started yesterday. She is also having flank pain on the right side. She denies blood in her urine, nausea, vomiting, fever, chills or body aches. She also has kidney stones that are too big to pass. She is working with a urologists to determine what to do about her kidney stones. She has had UTI' in the past. She had similar symptoms back in January 2014. Urinalysis and culture were negative. It seemed as if her symptoms were secondary to OAB. She was not started on medication but was advised to follow up with urology.   Review of Systems  Past Medical History  Diagnosis Date  . Anemia   . Atrial fibrillation     A.fib/flutter: s/p MAZE 2008, DCCV 2011, on coumadin; previously on flecainide, but dc'd due to worsening EF. Tikosyn discontinued 06/2012 after cardiac arrest.  . Diverticulitis   . CVA (cerebral vascular accident)     2008 - felt due to oscillating calcium on aortic valve  . Carcinoma in situ of vulva   . Fibroid   . Ovarian cyst, left 2008-2009  . Osteoporosis   . Diabetes mellitus   . Hypertension   . GERD (gastroesophageal reflux disease)   . Chronic systolic CHF (congestive heart failure)     EF 35%  . Ischemic cardiomyopathy     EF 35%  . Coronary artery disease     Occluded LM 2/2 previous aortic root surgery; s/p 2v CABG 2010 LIMA to LAD, SVG to OM; Cath 03/11/12 patent SVG to OM, atretic LIMA to LAD, patent RCA, occluded native LM, EF 35%   . Arthritis     OSTEO  . Fracture of lumbar spine   . 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  . Superficial venous thrombosis of upper extremity     06/2012 - LUE  . Severe aortic stenosis     s/p homograft aortic root replacement 2008  . Mitral regurgitation     s/p mitral valve repair 2008   . Thoracic aortic aneurysm     s/p resection and grafting 2008    Family History  Problem Relation Age of Onset  . Hypertension Mother   . Breast cancer Maternal Aunt     age 41's  . Cancer Paternal Grandmother     COLON    History   Social History  . Marital Status: Married    Spouse Name: N/A    Number of Children: N/A  . Years of Education: N/A   Occupational History  . Retired Radiation protection practitioner    Social History Main Topics  . Smoking status: Former Smoker    Quit date: 03/09/1983  . Smokeless tobacco: Never Used  . Alcohol Use: No  . Drug Use: No  . Sexually Active: Not Currently    Birth Control/ Protection: Post-menopausal   Other Topics Concern  . Not on file   Social History Narrative    Her mother died at age 49 from a stroke.  Her father died at age 1, had chronic obstructive pulmonary disease and colon disease.   No regular exercise    No Known Allergies  Constitutional: Denies fever, malaise, fatigue, headache or abrupt weight changes.   GU: Pt reports urgency, frequency. Denies pain with urination,  burning sensation, blood in urine, odor or discharge.  Skin: Denies redness, rashes, lesions or ulcercations.   No other specific complaints in a complete review of systems (except as listed in HPI above).    Objective:   Physical Exam  BP 138/82  Pulse 86  Temp(Src) 98 F (36.7 C) (Oral)  SpO2 95% Wt Readings from Last 3 Encounters:  06/07/2012 155 lb (70.308 kg)  06/16/12 159 lb 8 oz (72.349 kg)  06/09/12 159 lb 12.8 oz (72.485 kg)    General: Appears her stated age, well developed, well nourished in NAD. Cardiovascular: Normal rate and rhythm. S1,S2 noted.  No murmur, rubs or gallops noted. No JVD or BLE edema. No carotid bruits noted. Pulmonary/Chest: Normal effort and positive vesicular breath sounds. No respiratory distress. No wheezes, rales or ronchi noted.  Abdomen: Soft and nontender. Normal bowel sounds, no bruits noted. No distention or  masses noted. Liver, spleen and kidneys non palpable. Tender to palpation over the bladder area. No CVA tenderness.      Assessment & Plan:   Urgency and Frequency secondary to UTI  Urinalysis and urine culture eRx sent if for Macrobid 100 mg BID x 5 days Drink plenty of fluids  RTC as needed or if symptoms persist.

## 2012-07-21 NOTE — Addendum Note (Signed)
Addended by: Jearld Fenton on: 07/21/2012 09:25 AM   Modules accepted: Orders

## 2012-07-21 NOTE — Patient Instructions (Signed)
Urinary Tract Infection Urinary tract infections (UTIs) can develop anywhere along your urinary tract. Your urinary tract is your body's drainage system for removing wastes and extra water. Your urinary tract includes two kidneys, two ureters, a bladder, and a urethra. Your kidneys are a pair of bean-shaped organs. Each kidney is about the size of your fist. They are located below your ribs, one on each side of your spine. CAUSES Infections are caused by microbes, which are microscopic organisms, including fungi, viruses, and bacteria. These organisms are so small that they can only be seen through a microscope. Bacteria are the microbes that most commonly cause UTIs. SYMPTOMS  Symptoms of UTIs may vary by age and gender of the patient and by the location of the infection. Symptoms in young women typically include a frequent and intense urge to urinate and a painful, burning feeling in the bladder or urethra during urination. Older women and men are more likely to be tired, shaky, and weak and have muscle aches and abdominal pain. A fever may mean the infection is in your kidneys. Other symptoms of a kidney infection include pain in your back or sides below the ribs, nausea, and vomiting. DIAGNOSIS To diagnose a UTI, your caregiver will ask you about your symptoms. Your caregiver also will ask to provide a urine sample. The urine sample will be tested for bacteria and white blood cells. White blood cells are made by your body to help fight infection. TREATMENT  Typically, UTIs can be treated with medication. Because most UTIs are caused by a bacterial infection, they usually can be treated with the use of antibiotics. The choice of antibiotic and length of treatment depend on your symptoms and the type of bacteria causing your infection. HOME CARE INSTRUCTIONS  If you were prescribed antibiotics, take them exactly as your caregiver instructs you. Finish the medication even if you feel better after you  have only taken some of the medication.  Drink enough water and fluids to keep your urine clear or pale yellow.  Avoid caffeine, tea, and carbonated beverages. They tend to irritate your bladder.  Empty your bladder often. Avoid holding urine for long periods of time.  Empty your bladder before and after sexual intercourse.  After a bowel movement, women should cleanse from front to back. Use each tissue only once. SEEK MEDICAL CARE IF:   You have back pain.  You develop a fever.  Your symptoms do not begin to resolve within 3 days. SEEK IMMEDIATE MEDICAL CARE IF:   You have severe back pain or lower abdominal pain.  You develop chills.  You have nausea or vomiting.  You have continued burning or discomfort with urination. MAKE SURE YOU:   Understand these instructions.  Will watch your condition.  Will get help right away if you are not doing well or get worse. Document Released: 12/02/2004 Document Revised: 08/24/2011 Document Reviewed: 04/02/2011 Exit[employer] Patient Information 2013 [employer].  

## 2012-07-23 LAB — URINE CULTURE: Colony Count: 10000

## 2012-07-24 ENCOUNTER — Ambulatory Visit: Payer: Medicare Other

## 2012-07-24 DIAGNOSIS — R5381 Other malaise: Secondary | ICD-10-CM | POA: Diagnosis not present

## 2012-07-24 DIAGNOSIS — R269 Unspecified abnormalities of gait and mobility: Secondary | ICD-10-CM | POA: Diagnosis not present

## 2012-07-24 DIAGNOSIS — IMO0001 Reserved for inherently not codable concepts without codable children: Secondary | ICD-10-CM | POA: Diagnosis not present

## 2012-07-26 ENCOUNTER — Ambulatory Visit: Payer: Medicare Other | Admitting: *Deleted

## 2012-07-26 DIAGNOSIS — R5381 Other malaise: Secondary | ICD-10-CM | POA: Diagnosis not present

## 2012-07-26 DIAGNOSIS — R269 Unspecified abnormalities of gait and mobility: Secondary | ICD-10-CM | POA: Diagnosis not present

## 2012-07-26 DIAGNOSIS — IMO0001 Reserved for inherently not codable concepts without codable children: Secondary | ICD-10-CM | POA: Diagnosis not present

## 2012-07-28 ENCOUNTER — Other Ambulatory Visit: Payer: Self-pay | Admitting: Internal Medicine

## 2012-07-28 ENCOUNTER — Ambulatory Visit: Payer: Medicare Other

## 2012-07-28 DIAGNOSIS — IMO0001 Reserved for inherently not codable concepts without codable children: Secondary | ICD-10-CM | POA: Diagnosis not present

## 2012-07-28 DIAGNOSIS — R269 Unspecified abnormalities of gait and mobility: Secondary | ICD-10-CM | POA: Diagnosis not present

## 2012-07-28 DIAGNOSIS — R5381 Other malaise: Secondary | ICD-10-CM | POA: Diagnosis not present

## 2012-08-02 ENCOUNTER — Ambulatory Visit (INDEPENDENT_AMBULATORY_CARE_PROVIDER_SITE_OTHER): Payer: Medicare Other | Admitting: *Deleted

## 2012-08-02 DIAGNOSIS — Z7901 Long term (current) use of anticoagulants: Secondary | ICD-10-CM | POA: Diagnosis not present

## 2012-08-02 DIAGNOSIS — I4891 Unspecified atrial fibrillation: Secondary | ICD-10-CM

## 2012-08-02 DIAGNOSIS — I359 Nonrheumatic aortic valve disorder, unspecified: Secondary | ICD-10-CM

## 2012-08-02 DIAGNOSIS — I059 Rheumatic mitral valve disease, unspecified: Secondary | ICD-10-CM

## 2012-08-08 ENCOUNTER — Telehealth: Payer: Self-pay | Admitting: Cardiology

## 2012-08-08 NOTE — Telephone Encounter (Signed)
Spoke with pt, Aware of dr Jacalyn Lefevre recommendations. Order filled out and awaiting sig. Will fax to rehab once signed.

## 2012-08-08 NOTE — Telephone Encounter (Signed)
Ok for cardiac rehab [name]

## 2012-08-08 NOTE — Telephone Encounter (Signed)
Spoke with pt, she finished neuro rehab and they suggested the pt get involved with cardiac rehab. She has not heard from anyone and wants to find out if dr Stanford Breed thinks that would be a good idea. Will forward for dr Stanford Breed review

## 2012-08-08 NOTE — Telephone Encounter (Signed)
New Prob     Pt calling in following up on potential cardiac rehab and moving forward. Please call.

## 2012-08-09 ENCOUNTER — Ambulatory Visit (INDEPENDENT_AMBULATORY_CARE_PROVIDER_SITE_OTHER): Payer: Medicare Other | Admitting: *Deleted

## 2012-08-09 DIAGNOSIS — I4891 Unspecified atrial fibrillation: Secondary | ICD-10-CM

## 2012-08-09 DIAGNOSIS — Z7901 Long term (current) use of anticoagulants: Secondary | ICD-10-CM

## 2012-08-09 DIAGNOSIS — I059 Rheumatic mitral valve disease, unspecified: Secondary | ICD-10-CM | POA: Diagnosis not present

## 2012-08-09 DIAGNOSIS — I359 Nonrheumatic aortic valve disorder, unspecified: Secondary | ICD-10-CM | POA: Diagnosis not present

## 2012-08-09 LAB — POCT INR: INR: 2.1

## 2012-08-11 ENCOUNTER — Other Ambulatory Visit: Payer: Self-pay | Admitting: Physical Medicine & Rehabilitation

## 2012-08-11 ENCOUNTER — Other Ambulatory Visit: Payer: Self-pay | Admitting: Cardiology

## 2012-08-21 ENCOUNTER — Encounter: Payer: Self-pay | Admitting: Internal Medicine

## 2012-08-24 ENCOUNTER — Ambulatory Visit (INDEPENDENT_AMBULATORY_CARE_PROVIDER_SITE_OTHER): Payer: Medicare Other | Admitting: Cardiology

## 2012-08-24 ENCOUNTER — Ambulatory Visit (INDEPENDENT_AMBULATORY_CARE_PROVIDER_SITE_OTHER): Payer: Medicare Other | Admitting: *Deleted

## 2012-08-24 ENCOUNTER — Encounter: Payer: Self-pay | Admitting: Cardiology

## 2012-08-24 VITALS — BP 130/60 | HR 70 | Ht 63.5 in | Wt 150.0 lb

## 2012-08-24 DIAGNOSIS — I4891 Unspecified atrial fibrillation: Secondary | ICD-10-CM

## 2012-08-24 DIAGNOSIS — I359 Nonrheumatic aortic valve disorder, unspecified: Secondary | ICD-10-CM | POA: Diagnosis not present

## 2012-08-24 DIAGNOSIS — Z7901 Long term (current) use of anticoagulants: Secondary | ICD-10-CM

## 2012-08-24 DIAGNOSIS — I059 Rheumatic mitral valve disease, unspecified: Secondary | ICD-10-CM

## 2012-08-24 LAB — POCT INR: INR: 2.6

## 2012-08-24 MED ORDER — LOSARTAN POTASSIUM 50 MG PO TABS: 50.0000 mg | ORAL_TABLET | Freq: Every day | ORAL | Status: DC

## 2012-08-24 NOTE — Patient Instructions (Addendum)
Your physician recommends that you schedule a follow-up appointment in: [place] TO 62 MG ONCE DAILY  Your physician recommends that you return for lab work in: [place] chest x-ray takes a picture of the organs and structures inside the chest, including the heart, lungs, and blood vessels. This test can show several things, including, whether the heart is enlarges; whether fluid is building up in the lungs; and whether pacemaker / defibrillator leads are still in place. IN ONE WEEK IN [address] OFFICE

## 2012-08-24 NOTE — Progress Notes (Signed)
HPI: Pleasant female with history of severe AS, mitral regurgitation, aneurysm of the ascending thoracic aorta (status post aortic valve replacement, mitral valve repair, resection and grafting of the ascending thoracic aortic aneurysm on Jul 07, 2006). History of paroxysmal atrial fibrillation (status post maze procedure on Jul 07, 2006). Followup catheterization performed secondary to elevated troponin and recurrent atrial fibrillation revealed an ejection fraction of 25-30% and occlusion of the reimplanted left main. The patient subsequently underwent redo coronary artery bypass and graft with a LIMA to the LAD and a saphenous vein graft to the OM. She also had an epicardial lead placed if she required biventricular pacing in the future. A followup echocardiogram was performed in October of 2013. Her ejection fraction was 35-40%. There was mild perivalvular aortic insufficiency. There was mild mitral stenosis and mild residual mitral regurgitation. There was mild to moderate tricuspid regurgitation. Mild biatrial enlargement. MUGA was performed in October of 2013 and showed an ejection fraction of 35%. Because patient's LV function was reduced a Myoview was performed. There was a fixed inferior defect consistent with thinning and a partially reversible anterior defect consistent with prior infarct and mild peri-infarct ischemia. Ejection fraction 33%, global hypokinesis and anteroseptal akinesis. Cardiac catheterization repeated in January of 2014. The left main was occluded. The LAD, circumflex and right coronary artery were patent. The LIMA to the LAD was patent but atretic. Saphenous vein graft to the obtuse marginal was patent. This graft filled the obtuse marginal and ramus intermedius and the entire LAD filled retrograde from this graft. Ejection fraction was 35%. Patient referred to Dr. Rayann Heman for consideration of ICD; also placed on tikosyn for atrial fibrillation. Patient then admitted in March of 2014  following a ventricular fibrillation arrest. She required a prolonged resuscitation. Her QT was prolonged and her tikosyn DCed. Her hospital course was complicated by a DVT in her left arm. She ultimately had a [employer] ICD implanted. Amiodarone initiated. Since I last saw her in April of 2014, she denies dyspnea, chest pain, palpitations or syncope. She has had some nausea with her amiodarone.  Current Outpatient Prescriptions  Medication Sig Dispense Refill  . amiodarone (PACERONE) 200 MG tablet 200 mg daily.       . carvedilol (COREG) 12.5 MG tablet Take 1 tablet (12.5 mg total) by mouth 2 (two) times daily.  90 tablet  4  . Cholecalciferol (VITAMIN D3) 50000 UNITS CAPS Take 1 tablet by mouth once a week.  12 capsule  3  . Cyanocobalamin (VITAMIN B-12) 2500 MCG SUBL Place 1 tablet under the tongue 3 (three) times a week. Dissolves 1 tablet under the tongue on Monday, Wednesday, and Friday      . furosemide (LASIX) 20 MG tablet Take 20 mg by mouth daily.       Marland Kitchen losartan (COZAAR) 25 MG tablet TAKE 1 TABLET BY MOUTH EVERY DAY  30 tablet  1  . metFORMIN (GLUCOPHAGE-XR) 500 MG 24 hr tablet       . omeprazole (PRILOSEC OTC) 20 MG tablet Take 20 mg by mouth daily before breakfast.       . warfarin (COUMADIN) 3 MG tablet Take 3 mg by mouth as directed.       No current facility-administered medications for this visit.     Past Medical History  Diagnosis Date  . Anemia   . Atrial fibrillation     A.fib/flutter: s/p MAZE 2008, DCCV 2011, on coumadin; previously on flecainide, but dc'd due to worsening EF.  Tikosyn discontinued 06/2012 after cardiac arrest.  . Diverticulitis   . CVA (cerebral vascular accident)     2008 - felt due to oscillating calcium on aortic valve  . Carcinoma in situ of vulva   . Fibroid   . Ovarian cyst, left 2008-2009  . Osteoporosis   . Diabetes mellitus   . Hypertension   . GERD (gastroesophageal reflux disease)   . Chronic systolic CHF (congestive heart failure)       EF 35%  . Ischemic cardiomyopathy     EF 35%  . Coronary artery disease     Occluded LM 2/2 previous aortic root surgery; s/p 2v CABG 2010 LIMA to LAD, SVG to OM; Cath 03/11/12 patent SVG to OM, atretic LIMA to LAD, patent RCA, occluded native LM, EF 35%   . Arthritis     OSTEO  . Fracture of lumbar spine   . 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  . Superficial venous thrombosis of upper extremity     06/2012 - LUE  . Severe aortic stenosis     s/p homograft aortic root replacement 2008  . Mitral regurgitation     s/p mitral valve repair 2008  . Thoracic aortic aneurysm     s/p resection and grafting 2008    Past Surgical History  Procedure Laterality Date  . Foot surgery      removed neuroma  . Aortic root replacement  2008    homograft  . Mv repair  2008    due to severe MR  . Resection and grafting of ascending thoracic aortic    . Aneurysm and left sided maze  2008  . Coronary artery bypass graft  4/10    LIMA to LAD, SVG to OM  . Wide excision of vulva      CA INSITU    History   Social History  . Marital Status: Married    Spouse Name: N/A    Number of Children: N/A  . Years of Education: N/A   Occupational History  . Retired Radiation protection practitioner    Social History Main Topics  . Smoking status: Former Smoker    Quit date: 03/09/1983  . Smokeless tobacco: Never Used  . Alcohol Use: No  . Drug Use: No  . Sexually Active: Not Currently    Birth Control/ Protection: Post-menopausal   Other Topics Concern  . Not on file   Social History Narrative    Her mother died at age 64 from a stroke.  Her father died at age 88, had chronic obstructive pulmonary disease and colon disease.   No regular exercise    ROS: no fevers or chills, productive cough, hemoptysis, dysphasia, odynophagia, melena, hematochezia,  dysuria, hematuria, rash, seizure activity, orthopnea, PND, pedal edema, claudication. Remaining systems are negative.  Physical Exam: Well-developed well-nourished in no acute distress.  Skin is warm and dry.  HEENT is normal.  Neck is supple.  Chest is clear to auscultation with normal expansion.  Cardiovascular exam is regular rate and rhythm. 2/6 systolic murmur left sternal border. Abdominal exam nontender or distended. No masses palpated. Extremities show no edema. neuro grossly intact

## 2012-08-24 NOTE — Assessment & Plan Note (Signed)
Continue beta blocker. Increase Cozaar to 50 mg daily. Check potassium and renal function in one week.

## 2012-08-24 NOTE — Assessment & Plan Note (Signed)
Continue SBE prophylaxis. 

## 2012-08-24 NOTE — Assessment & Plan Note (Addendum)
Management per primary care. 

## 2012-08-24 NOTE — Assessment & Plan Note (Addendum)
Patient is having some nausea with amiodarone. However her options are limited. She cannot take a 1C; she had VT with tikosyn. Will continue for now. She will discuss further options with Dr. Rayann Heman when she sees him in July. Continue Coumadin. Check TSH, liver functions and chest x-ray.

## 2012-08-24 NOTE — Assessment & Plan Note (Signed)
Status post ICD. Followup electrophysiology.

## 2012-08-24 NOTE — Assessment & Plan Note (Signed)
Status post mitral valve surgery-continue SBE prophylaxis.

## 2012-08-31 ENCOUNTER — Other Ambulatory Visit (INDEPENDENT_AMBULATORY_CARE_PROVIDER_SITE_OTHER): Payer: Medicare Other

## 2012-08-31 ENCOUNTER — Encounter (HOSPITAL_COMMUNITY)
Admission: RE | Admit: 2012-08-31 | Discharge: 2012-08-31 | Disposition: A | Discharge status: Home / Self Care | Payer: Medicare Other | Source: Ambulatory Visit | Attending: Cardiology | Admitting: Cardiology

## 2012-08-31 ENCOUNTER — Ambulatory Visit (INDEPENDENT_AMBULATORY_CARE_PROVIDER_SITE_OTHER)
Admission: RE | Admit: 2012-08-31 | Discharge: 2012-08-31 | Disposition: A | Discharge status: Home / Self Care | Payer: Medicare Other | Source: Ambulatory Visit | Attending: Cardiology | Admitting: Cardiology

## 2012-08-31 DIAGNOSIS — I4891 Unspecified atrial fibrillation: Secondary | ICD-10-CM | POA: Diagnosis not present

## 2012-08-31 DIAGNOSIS — J438 Other emphysema: Secondary | ICD-10-CM | POA: Diagnosis not present

## 2012-08-31 DIAGNOSIS — J9819 Other pulmonary collapse: Secondary | ICD-10-CM | POA: Diagnosis not present

## 2012-08-31 LAB — HEPATIC FUNCTION PANEL
ALT: 11 U/L (ref 0–35)
AST: 13 U/L (ref 0–37)
Alkaline Phosphatase: 90 U/L (ref 39–117)
Bilirubin, Direct: 0.1 mg/dL (ref 0.0–0.3)
Total Bilirubin: 0.4 mg/dL (ref 0.3–1.2)

## 2012-08-31 LAB — BASIC METABOLIC PANEL
CO2: 27 mEq/L (ref 19–32)
Chloride: 105 mEq/L (ref 96–112)
Potassium: 4.6 mEq/L (ref 3.5–5.1)
Sodium: 138 mEq/L (ref 135–145)

## 2012-08-31 NOTE — Progress Notes (Signed)
Cardiac Rehab Medication Review by a Pharmacist  Does the patient  feel that his/her medications are working for him/her?  Yes, some medications are still very new however  Has the patient been experiencing any side effects to the medications prescribed?  Yes, the amiodarone makes her nauseated (with some vomiting) sometimes.  She states this always happens at night.  It also makes her want to drink water less and makes food less appetizing.  Does the patient measure his/her own blood pressure or blood glucose at home?  No, she has a BP cuff at home but has not been monitoring.  She does occasionally check her blood sugar at home.  She is looking into buying a new a new meter.  Does the patient have any problems obtaining medications due to transportation or finances?   no  Understanding of regimen: good Understanding of indications: good Potential of compliance: good    Pharmacist comments: Patient has a great understanding of her medications and their indications.    Dicky Doe, PharmD Clinical Pharmacist Pager: [phone] Phone: [phone] 08/31/2012 8:53 AM

## 2012-09-04 ENCOUNTER — Encounter (HOSPITAL_COMMUNITY): Payer: Medicare Other

## 2012-09-06 ENCOUNTER — Encounter (HOSPITAL_COMMUNITY)
Admission: RE | Admit: 2012-09-06 | Discharge: 2012-09-06 | Disposition: A | Discharge status: Home / Self Care | Payer: Medicare Other | Source: Ambulatory Visit | Attending: Cardiology | Admitting: Cardiology

## 2012-09-06 DIAGNOSIS — Z954 Presence of other heart-valve replacement: Secondary | ICD-10-CM | POA: Insufficient documentation

## 2012-09-06 DIAGNOSIS — I2510 Atherosclerotic heart disease of native coronary artery without angina pectoris: Secondary | ICD-10-CM | POA: Insufficient documentation

## 2012-09-06 DIAGNOSIS — Z8674 Personal history of sudden cardiac arrest: Secondary | ICD-10-CM | POA: Diagnosis not present

## 2012-09-06 DIAGNOSIS — I509 Heart failure, unspecified: Secondary | ICD-10-CM | POA: Diagnosis not present

## 2012-09-06 DIAGNOSIS — Z5189 Encounter for other specified aftercare: Secondary | ICD-10-CM | POA: Diagnosis not present

## 2012-09-06 DIAGNOSIS — I4891 Unspecified atrial fibrillation: Secondary | ICD-10-CM | POA: Diagnosis not present

## 2012-09-06 DIAGNOSIS — I2589 Other forms of chronic ischemic heart disease: Secondary | ICD-10-CM | POA: Diagnosis not present

## 2012-09-06 DIAGNOSIS — I5022 Chronic systolic (congestive) heart failure: Secondary | ICD-10-CM | POA: Insufficient documentation

## 2012-09-06 NOTE — Progress Notes (Signed)
Pt started cardiac rehab today.  Pt tolerated light exercise without difficulty. Telemetry rhythm Sinus rhythm with arrythmia, this is previously documented. Small P wave present. Vital signs stable. Will continue to monitor the patient throughout  the program.

## 2012-09-07 LAB — GLUCOSE, CAPILLARY: Glucose-Capillary: 108 mg/dL — ABNORMAL HIGH (ref 70–99)

## 2012-09-08 ENCOUNTER — Encounter (HOSPITAL_COMMUNITY): Payer: Medicare Other

## 2012-09-11 ENCOUNTER — Encounter: Payer: Self-pay | Admitting: Internal Medicine

## 2012-09-11 ENCOUNTER — Ambulatory Visit (INDEPENDENT_AMBULATORY_CARE_PROVIDER_SITE_OTHER): Payer: Medicare Other | Admitting: *Deleted

## 2012-09-11 ENCOUNTER — Encounter (HOSPITAL_COMMUNITY)
Admission: RE | Admit: 2012-09-11 | Discharge: 2012-09-11 | Disposition: A | Discharge status: Home / Self Care | Payer: Medicare Other | Source: Ambulatory Visit | Attending: Cardiology | Admitting: Cardiology

## 2012-09-11 ENCOUNTER — Ambulatory Visit (INDEPENDENT_AMBULATORY_CARE_PROVIDER_SITE_OTHER): Payer: Medicare Other | Admitting: Internal Medicine

## 2012-09-11 VITALS — BP 128/74 | HR 79 | Ht 63.5 in | Wt 156.4 lb

## 2012-09-11 DIAGNOSIS — I059 Rheumatic mitral valve disease, unspecified: Secondary | ICD-10-CM

## 2012-09-11 DIAGNOSIS — I2589 Other forms of chronic ischemic heart disease: Secondary | ICD-10-CM | POA: Diagnosis not present

## 2012-09-11 DIAGNOSIS — I4891 Unspecified atrial fibrillation: Secondary | ICD-10-CM

## 2012-09-11 DIAGNOSIS — I4901 Ventricular fibrillation: Secondary | ICD-10-CM | POA: Diagnosis not present

## 2012-09-11 DIAGNOSIS — I469 Cardiac arrest, cause unspecified: Secondary | ICD-10-CM

## 2012-09-11 DIAGNOSIS — I359 Nonrheumatic aortic valve disorder, unspecified: Secondary | ICD-10-CM

## 2012-09-11 DIAGNOSIS — I255 Ischemic cardiomyopathy: Secondary | ICD-10-CM

## 2012-09-11 DIAGNOSIS — Z7901 Long term (current) use of anticoagulants: Secondary | ICD-10-CM | POA: Diagnosis not present

## 2012-09-11 LAB — GLUCOSE, CAPILLARY
Glucose-Capillary: 109 mg/dL — ABNORMAL HIGH (ref 70–99)
Glucose-Capillary: 148 mg/dL — ABNORMAL HIGH (ref 70–99)

## 2012-09-11 NOTE — Patient Instructions (Addendum)
Remote monitoring is used to monitor your Pacemaker of ICD from home. This monitoring reduces the number of office visits required to check your device to one time per year. It allows [place] to keep an eye on the functioning of your device to ensure it is working properly. You are scheduled for a device check from home on December 11, 2012. You may send your transmission at any time that day. If you have a wireless device, the transmission will be sent automatically. After your physician reviews your transmission, you will receive a postcard with your next transmission date.  Your physician wants you to follow-up in: 9 months with Dr Rayann Heman.  You will receive a reminder letter in the mail two months in advance. If you don't receive a letter, please call our office to schedule the follow-up appointment.

## 2012-09-11 NOTE — Progress Notes (Signed)
PCP: Scarlette Calico, MD Primary Cardiologist:  Dr Olen Pel [name] is a 72 y.o. female who presents today for routine electrophysiology followup.  Since discharge from the hospital, the patient reports that she is making steady improvement. She has no problems with palpitations, syncope or pre-syncope.  Her ICD site does have some tenderness still.  There is no redness or swelling of her ICD site.   Today, she denies symptoms of palpitations, chest pain, shortness of breath,  lower extremity edema, dizziness, presyncope, syncope, or ICD shocks.  The patient is otherwise without complaint today.   Past Medical History  Diagnosis Date  . Anemia   . Atrial fibrillation     A.fib/flutter: s/p MAZE 2008, DCCV 2011, on coumadin; previously on flecainide, but dc'd due to worsening EF. Tikosyn discontinued 06/2012 after cardiac arrest.  . Diverticulitis   . CVA (cerebral vascular accident)     2008 - felt due to oscillating calcium on aortic valve  . Carcinoma in situ of vulva   . Fibroid   . Ovarian cyst, left 2008-2009  . Osteoporosis   . Diabetes mellitus   . Hypertension   . GERD (gastroesophageal reflux disease)   . Chronic systolic CHF (congestive heart failure)     EF 35%  . Ischemic cardiomyopathy     EF 35%  . Coronary artery disease     Occluded LM 2/2 previous aortic root surgery; s/p 2v CABG 2010 LIMA to LAD, SVG to OM; Cath 03/11/12 patent SVG to OM, atretic LIMA to LAD, patent RCA, occluded native LM, EF 35%   . Arthritis     OSTEO  . Fracture of lumbar spine   . 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  . Superficial venous thrombosis of upper extremity     06/2012 - LUE  . Severe aortic stenosis     s/p homograft aortic root replacement 2008  . Mitral regurgitation     s/p mitral valve repair 2008  . Thoracic aortic  aneurysm     s/p resection and grafting 2008   Past Surgical History  Procedure Laterality Date  . Foot surgery      removed neuroma  . Aortic root replacement  2008    homograft  . Mv repair  2008    due to severe MR  . Resection and grafting of ascending thoracic aortic    . Aneurysm and left sided maze  2008  . Coronary artery bypass graft  4/10    LIMA to LAD, SVG to OM  . Wide excision of vulva      CA INSITU    Current Outpatient Prescriptions  Medication Sig Dispense Refill  . acetaminophen (TYLENOL) 500 MG tablet Take 500 mg by mouth daily as needed for pain (patient takes very infrequently and only if her back is really hurting).      Marland Kitchen amiodarone (PACERONE) 200 MG tablet 200 mg daily.       . carvedilol (COREG) 12.5 MG tablet Take 1 tablet (12.5 mg total) by mouth 2 (two) times daily.  90 tablet  4  . Cholecalciferol (VITAMIN D3) 50000 UNITS CAPS Take 1 tablet by mouth once a week.  12 capsule  3  . Cyanocobalamin (VITAMIN B-12) 2500 MCG SUBL Place 1 tablet under the tongue 3 (three) times a week. Dissolves 1 tablet under the tongue on  Monday, Wednesday, and Friday      . furosemide (LASIX) 20 MG tablet Take 20 mg by mouth daily.       Marland Kitchen losartan (COZAAR) 50 MG tablet Take 1 tablet (50 mg total) by mouth daily.  90 tablet  4  . metFORMIN (GLUCOPHAGE-XR) 500 MG 24 hr tablet Patient takes 1 tablet in the morning and 3 tablets after dinner      . omeprazole (PRILOSEC OTC) 20 MG tablet Take 20 mg by mouth daily before breakfast.       . warfarin (COUMADIN) 3 MG tablet Take 3 mg by mouth as directed. Take 1/2 tab (1.5mg ) everyday except Wednesday then take 1 whole tab (3mg )       No current facility-administered medications for this visit.    Physical Exam: Filed Vitals:   09/10/2012 1436  BP: 128/74  Pulse: 79  Height: 5' 3.5" (1.613 m)  Weight: 156 lb 6.4 oz (70.943 kg)    GEN- The patient is well appearing, alert and oriented x 3 today.   Head- normocephalic,  atraumatic Eyes-  Sclera clear, conjunctiva pink Ears- hearing intact Oropharynx- clear Lungs- Clear to ausculation bilaterally, normal work of breathing Chest- ICD pocket is well healed Heart- Regular rate and [id number] early systolic murmur LUSB GI- soft, NT, ND, + BS Extremities- no clubbing, cyanosis, or edema  ICD interrogation- reviewed in detail today,  See PACEART report  Assessment and Plan:  1. afib Well controlled with amiodarone She has mild nausea with amiodarone We might eventually decrease her dose to 100mg  daily Continue long term anticoagulation  2. S/p VF arrest Normal ICD function See Pace Art report No changes today  3. Valvular heart disease/ ischemic CM Stable No changes today

## 2012-09-13 ENCOUNTER — Encounter (HOSPITAL_COMMUNITY)
Admission: RE | Admit: 2012-09-13 | Discharge: 2012-09-13 | Disposition: A | Discharge status: Home / Self Care | Payer: Medicare Other | Source: Ambulatory Visit | Attending: Cardiology | Admitting: Cardiology

## 2012-09-13 ENCOUNTER — Other Ambulatory Visit: Payer: Self-pay | Admitting: Internal Medicine

## 2012-09-13 ENCOUNTER — Other Ambulatory Visit: Payer: Self-pay | Admitting: Physical Medicine & Rehabilitation

## 2012-09-13 LAB — GLUCOSE, CAPILLARY: Glucose-Capillary: 143 mg/dL — ABNORMAL HIGH (ref 70–99)

## 2012-09-14 ENCOUNTER — Telehealth: Payer: Self-pay | Admitting: Cardiology

## 2012-09-14 ENCOUNTER — Other Ambulatory Visit: Payer: Self-pay | Admitting: Cardiology

## 2012-09-14 NOTE — Telephone Encounter (Signed)
Ok to take calcium [name]

## 2012-09-14 NOTE — Telephone Encounter (Signed)
Spoke with pt, Aware of dr crenshaw's recommendations.  °

## 2012-09-14 NOTE — Telephone Encounter (Signed)
Spoke with pt, she wants to know from dr Stanford Breed if she could take OTC calcium 500 mg once daily. Will forward for dr Stanford Breed review

## 2012-09-14 NOTE — Telephone Encounter (Signed)
New problem   Pt calling to see if she can take calcium supplement

## 2012-09-15 ENCOUNTER — Encounter (HOSPITAL_COMMUNITY)
Admission: RE | Admit: 2012-09-15 | Discharge: 2012-09-15 | Disposition: A | Discharge status: Home / Self Care | Payer: Medicare Other | Source: Ambulatory Visit | Attending: Cardiology | Admitting: Cardiology

## 2012-09-15 DIAGNOSIS — M810 Age-related osteoporosis without current pathological fracture: Secondary | ICD-10-CM | POA: Diagnosis not present

## 2012-09-15 DIAGNOSIS — S22009A Unspecified fracture of unspecified thoracic vertebra, initial encounter for closed fracture: Secondary | ICD-10-CM | POA: Diagnosis not present

## 2012-09-15 NOTE — Progress Notes (Signed)
Reviewed home exercise with pt today.  Pt plans to use exercise rower at home on days not in class ... For exercise.  Reviewed THR, pulse, RPE, sign and symptoms, NTG use, and when to call 911 or MD.  Pt voiced understanding.  [place], [place], ACSM RCEP

## 2012-09-18 ENCOUNTER — Encounter (HOSPITAL_COMMUNITY)
Admission: RE | Admit: 2012-09-18 | Discharge: 2012-09-18 | Disposition: A | Discharge status: Home / Self Care | Payer: Medicare Other | Source: Ambulatory Visit | Attending: Cardiology | Admitting: Cardiology

## 2012-09-18 LAB — GLUCOSE, CAPILLARY: Glucose-Capillary: 117 mg/dL — ABNORMAL HIGH (ref 70–99)

## 2012-09-20 ENCOUNTER — Encounter (HOSPITAL_COMMUNITY)
Admission: RE | Admit: 2012-09-20 | Discharge: 2012-09-20 | Disposition: A | Discharge status: Home / Self Care | Payer: Medicare Other | Source: Ambulatory Visit | Attending: Cardiology | Admitting: Cardiology

## 2012-09-22 ENCOUNTER — Encounter (HOSPITAL_COMMUNITY)
Admission: RE | Admit: 2012-09-22 | Discharge: 2012-09-22 | Disposition: A | Discharge status: Home / Self Care | Payer: Medicare Other | Source: Ambulatory Visit | Attending: Cardiology | Admitting: Cardiology

## 2012-09-22 ENCOUNTER — Ambulatory Visit (INDEPENDENT_AMBULATORY_CARE_PROVIDER_SITE_OTHER): Payer: Medicare Other | Admitting: *Deleted

## 2012-09-22 DIAGNOSIS — I359 Nonrheumatic aortic valve disorder, unspecified: Secondary | ICD-10-CM

## 2012-09-22 DIAGNOSIS — Z7901 Long term (current) use of anticoagulants: Secondary | ICD-10-CM

## 2012-09-22 DIAGNOSIS — I059 Rheumatic mitral valve disease, unspecified: Secondary | ICD-10-CM

## 2012-09-22 DIAGNOSIS — I4891 Unspecified atrial fibrillation: Secondary | ICD-10-CM | POA: Diagnosis not present

## 2012-09-22 LAB — POCT INR: INR: 1.5

## 2012-09-25 ENCOUNTER — Encounter (HOSPITAL_COMMUNITY)
Admission: RE | Admit: 2012-09-25 | Discharge: 2012-09-25 | Disposition: A | Discharge status: Home / Self Care | Payer: Medicare Other | Source: Ambulatory Visit | Attending: Cardiology | Admitting: Cardiology

## 2012-09-26 ENCOUNTER — Encounter: Payer: Self-pay | Admitting: Cardiology

## 2012-09-27 ENCOUNTER — Encounter (HOSPITAL_COMMUNITY)
Admission: RE | Admit: 2012-09-27 | Discharge: 2012-09-27 | Disposition: A | Discharge status: Home / Self Care | Payer: Medicare Other | Source: Ambulatory Visit | Attending: Cardiology | Admitting: Cardiology

## 2012-09-29 ENCOUNTER — Encounter (HOSPITAL_COMMUNITY)
Admission: RE | Admit: 2012-09-29 | Discharge: 2012-09-29 | Disposition: A | Discharge status: Home / Self Care | Payer: Medicare Other | Source: Ambulatory Visit | Attending: Cardiology | Admitting: Cardiology

## 2012-10-02 ENCOUNTER — Encounter (HOSPITAL_COMMUNITY)
Admission: RE | Admit: 2012-10-02 | Discharge: 2012-10-02 | Disposition: A | Discharge status: Home / Self Care | Payer: Medicare Other | Source: Ambulatory Visit | Attending: Cardiology | Admitting: Cardiology

## 2012-10-02 DIAGNOSIS — N200 Calculus of kidney: Secondary | ICD-10-CM | POA: Diagnosis not present

## 2012-10-04 ENCOUNTER — Ambulatory Visit (INDEPENDENT_AMBULATORY_CARE_PROVIDER_SITE_OTHER): Payer: Medicare Other | Admitting: *Deleted

## 2012-10-04 ENCOUNTER — Encounter (HOSPITAL_COMMUNITY)
Admission: RE | Admit: 2012-10-04 | Discharge: 2012-10-04 | Disposition: A | Discharge status: Home / Self Care | Payer: Medicare Other | Source: Ambulatory Visit | Attending: Cardiology | Admitting: Cardiology

## 2012-10-04 DIAGNOSIS — I359 Nonrheumatic aortic valve disorder, unspecified: Secondary | ICD-10-CM | POA: Diagnosis not present

## 2012-10-04 DIAGNOSIS — I059 Rheumatic mitral valve disease, unspecified: Secondary | ICD-10-CM

## 2012-10-04 DIAGNOSIS — Z7901 Long term (current) use of anticoagulants: Secondary | ICD-10-CM | POA: Diagnosis not present

## 2012-10-04 DIAGNOSIS — I4891 Unspecified atrial fibrillation: Secondary | ICD-10-CM | POA: Diagnosis not present

## 2012-10-04 LAB — POCT INR: INR: 1.5

## 2012-10-04 NOTE — Progress Notes (Signed)
[name] 72 y.o. female Nutrition Note Spoke with pt. Pt known to this Probation officer from previous admission. Nutrition Plan and Nutrition Survey goals reviewed with pt. Pt is following Step 2 of the Therapeutic Lifestyle Changes diet. Pt states she lost 30 lb over 3 months because "I was in the hospital for 29 days." Pt c/o of regular N/V and poor appetite, which pt relates to Amiodarone. Per pt, "I've basically run out of options as far as medications other than Amiodarone." Pt reports she previously took Amiodarone and had the same side effects (e.g. Taste changes, Nausea, occasional vomiting), which resolved when pt stopped taking the med. Pt is managing side effects of Amiodarone by using Ensure or Glucerna supplements. Pt feels nausea is worse at night due to "odors I'm exposed to while cooking." Pt is diabetic. Last A1c indicates blood glucose well-controlled. Pt checks her CBG's before exercise "to make sure my blood sugar is high enough for exercise." This writer went over Diabetes Education test results. Pt on Coumadin and is aware of the need to follow a Consistent Vitamin K diet. Per discussion, pt states she is "gluten intolerant." Per pt, "I was diagnosed as having Celiac, but I don't think I am because my colonoscopy results were inconclusive and the blood test was positive." Pt avoids many foods with gluten. Pt expressed understanding of the information reviewed. Pt aware of nutrition education classes offered and is unable to attend nutrition classes "because my husband has to drive me and Tuesday is his golf day."  Nutrition Diagnosis   Food-and nutrition-related knowledge deficit related to lack of exposure to information as related to diagnosis of: ? CVD ? DM (A1c 6.2)  Nutrition RX/ Estimated Daily Nutrition Needs for: wt maintenance 1700-1900 Kcal, 55-60 gm fat, 11-13 gm sat fat, 1.6-1.9 gm trans-fat, <1500 mg sodium, 250 gm CHO   Nutrition Intervention   Pt's individual nutrition plan  reviewed with pt.   Benefits of adopting Therapeutic Lifestyle Changes discussed when Medficts reviewed.   Pt to attend the Portion Distortion class - met 10/04/12   Pt given handouts for: ? Nutrition I class ? Nutrition II class ? Diabetes Blitz class   Continue client-centered nutrition education by RD, as part of interdisciplinary care. Goal(s)   Pt to consume fruits, vegetables, whole grains, and low-fat dairy products in a heart healthy meal plan as tolerated.  Monitor and Evaluate progress toward nutrition goal with team.  Nutrition Risk: Change to Moderate  Derek Mound, M.Ed, RD, LDN, CDE 10/04/2012 12:26 PM

## 2012-10-06 ENCOUNTER — Encounter (HOSPITAL_COMMUNITY)
Admission: RE | Admit: 2012-10-06 | Discharge: 2012-10-06 | Disposition: A | Discharge status: Home / Self Care | Payer: Medicare Other | Source: Ambulatory Visit | Attending: Cardiology | Admitting: Cardiology

## 2012-10-06 DIAGNOSIS — I509 Heart failure, unspecified: Secondary | ICD-10-CM | POA: Diagnosis not present

## 2012-10-06 DIAGNOSIS — I2510 Atherosclerotic heart disease of native coronary artery without angina pectoris: Secondary | ICD-10-CM | POA: Insufficient documentation

## 2012-10-06 DIAGNOSIS — 419620001 Death: Secondary | SNOMED CT

## 2012-10-06 DIAGNOSIS — I4891 Unspecified atrial fibrillation: Secondary | ICD-10-CM | POA: Diagnosis not present

## 2012-10-06 DIAGNOSIS — I5022 Chronic systolic (congestive) heart failure: Secondary | ICD-10-CM | POA: Diagnosis not present

## 2012-10-06 DIAGNOSIS — Z5189 Encounter for other specified aftercare: Secondary | ICD-10-CM | POA: Insufficient documentation

## 2012-10-06 DIAGNOSIS — Z8674 Personal history of sudden cardiac arrest: Secondary | ICD-10-CM | POA: Diagnosis not present

## 2012-10-06 DIAGNOSIS — I2589 Other forms of chronic ischemic heart disease: Secondary | ICD-10-CM | POA: Diagnosis not present

## 2012-10-06 DIAGNOSIS — Z954 Presence of other heart-valve replacement: Secondary | ICD-10-CM | POA: Diagnosis not present

## 2012-10-06 DEATH — deceased

## 2012-10-09 ENCOUNTER — Encounter (HOSPITAL_COMMUNITY)
Admission: RE | Admit: 2012-10-09 | Discharge: 2012-10-09 | Disposition: A | Discharge status: Home / Self Care | Payer: Medicare Other | Source: Ambulatory Visit | Attending: Cardiology | Admitting: Cardiology

## 2012-10-09 DIAGNOSIS — Z8674 Personal history of sudden cardiac arrest: Secondary | ICD-10-CM | POA: Diagnosis not present

## 2012-10-09 DIAGNOSIS — I5022 Chronic systolic (congestive) heart failure: Secondary | ICD-10-CM | POA: Diagnosis not present

## 2012-10-09 DIAGNOSIS — I4891 Unspecified atrial fibrillation: Secondary | ICD-10-CM | POA: Diagnosis not present

## 2012-10-09 DIAGNOSIS — Z5189 Encounter for other specified aftercare: Secondary | ICD-10-CM | POA: Diagnosis not present

## 2012-10-09 DIAGNOSIS — I509 Heart failure, unspecified: Secondary | ICD-10-CM | POA: Diagnosis not present

## 2012-10-09 DIAGNOSIS — I2589 Other forms of chronic ischemic heart disease: Secondary | ICD-10-CM | POA: Diagnosis not present

## 2012-10-11 ENCOUNTER — Encounter (HOSPITAL_COMMUNITY)
Admission: RE | Admit: 2012-10-11 | Discharge: 2012-10-11 | Disposition: A | Discharge status: Home / Self Care | Payer: Medicare Other | Source: Ambulatory Visit | Attending: Cardiology | Admitting: Cardiology

## 2012-10-11 DIAGNOSIS — I5022 Chronic systolic (congestive) heart failure: Secondary | ICD-10-CM | POA: Diagnosis not present

## 2012-10-11 DIAGNOSIS — Z8674 Personal history of sudden cardiac arrest: Secondary | ICD-10-CM | POA: Diagnosis not present

## 2012-10-11 DIAGNOSIS — I509 Heart failure, unspecified: Secondary | ICD-10-CM | POA: Diagnosis not present

## 2012-10-11 DIAGNOSIS — Z5189 Encounter for other specified aftercare: Secondary | ICD-10-CM | POA: Diagnosis not present

## 2012-10-11 DIAGNOSIS — I2589 Other forms of chronic ischemic heart disease: Secondary | ICD-10-CM | POA: Diagnosis not present

## 2012-10-11 DIAGNOSIS — I4891 Unspecified atrial fibrillation: Secondary | ICD-10-CM | POA: Diagnosis not present

## 2012-10-13 ENCOUNTER — Encounter (HOSPITAL_COMMUNITY): Payer: Medicare Other

## 2012-10-16 ENCOUNTER — Encounter (HOSPITAL_COMMUNITY)
Admission: RE | Admit: 2012-10-16 | Discharge: 2012-10-16 | Disposition: A | Discharge status: Home / Self Care | Payer: Medicare Other | Source: Ambulatory Visit | Attending: Cardiology | Admitting: Cardiology

## 2012-10-16 ENCOUNTER — Ambulatory Visit (INDEPENDENT_AMBULATORY_CARE_PROVIDER_SITE_OTHER): Payer: Medicare Other | Admitting: Pharmacist

## 2012-10-16 DIAGNOSIS — Z5189 Encounter for other specified aftercare: Secondary | ICD-10-CM | POA: Diagnosis not present

## 2012-10-16 DIAGNOSIS — I359 Nonrheumatic aortic valve disorder, unspecified: Secondary | ICD-10-CM

## 2012-10-16 DIAGNOSIS — I059 Rheumatic mitral valve disease, unspecified: Secondary | ICD-10-CM | POA: Diagnosis not present

## 2012-10-16 DIAGNOSIS — I509 Heart failure, unspecified: Secondary | ICD-10-CM | POA: Diagnosis not present

## 2012-10-16 DIAGNOSIS — Z7901 Long term (current) use of anticoagulants: Secondary | ICD-10-CM

## 2012-10-16 DIAGNOSIS — Z8674 Personal history of sudden cardiac arrest: Secondary | ICD-10-CM | POA: Diagnosis not present

## 2012-10-16 DIAGNOSIS — I4891 Unspecified atrial fibrillation: Secondary | ICD-10-CM

## 2012-10-16 DIAGNOSIS — I2589 Other forms of chronic ischemic heart disease: Secondary | ICD-10-CM | POA: Diagnosis not present

## 2012-10-16 DIAGNOSIS — I5022 Chronic systolic (congestive) heart failure: Secondary | ICD-10-CM | POA: Diagnosis not present

## 2012-10-16 LAB — POCT INR: INR: 3.8

## 2012-10-18 ENCOUNTER — Ambulatory Visit (INDEPENDENT_AMBULATORY_CARE_PROVIDER_SITE_OTHER): Payer: Medicare Other | Admitting: Internal Medicine

## 2012-10-18 ENCOUNTER — Encounter (HOSPITAL_COMMUNITY)
Admission: RE | Admit: 2012-10-18 | Discharge: 2012-10-18 | Disposition: A | Discharge status: Home / Self Care | Payer: Medicare Other | Source: Ambulatory Visit | Attending: Cardiology | Admitting: Cardiology

## 2012-10-18 ENCOUNTER — Other Ambulatory Visit (INDEPENDENT_AMBULATORY_CARE_PROVIDER_SITE_OTHER): Payer: Medicare Other

## 2012-10-18 ENCOUNTER — Encounter: Payer: Self-pay | Admitting: Internal Medicine

## 2012-10-18 VITALS — BP 132/68 | HR 64 | Temp 98.6°F | Resp 16 | Wt 151.0 lb

## 2012-10-18 DIAGNOSIS — I5022 Chronic systolic (congestive) heart failure: Secondary | ICD-10-CM | POA: Diagnosis not present

## 2012-10-18 DIAGNOSIS — D509 Iron deficiency anemia, unspecified: Secondary | ICD-10-CM

## 2012-10-18 DIAGNOSIS — M818 Other osteoporosis without current pathological fracture: Secondary | ICD-10-CM

## 2012-10-18 DIAGNOSIS — Z8674 Personal history of sudden cardiac arrest: Secondary | ICD-10-CM | POA: Diagnosis not present

## 2012-10-18 DIAGNOSIS — E538 Deficiency of other specified B group vitamins: Secondary | ICD-10-CM

## 2012-10-18 DIAGNOSIS — Z5189 Encounter for other specified aftercare: Secondary | ICD-10-CM | POA: Diagnosis not present

## 2012-10-18 DIAGNOSIS — E559 Vitamin D deficiency, unspecified: Secondary | ICD-10-CM | POA: Diagnosis not present

## 2012-10-18 DIAGNOSIS — I10 Essential (primary) hypertension: Secondary | ICD-10-CM

## 2012-10-18 DIAGNOSIS — K900 Celiac disease: Secondary | ICD-10-CM | POA: Diagnosis not present

## 2012-10-18 DIAGNOSIS — I509 Heart failure, unspecified: Secondary | ICD-10-CM | POA: Diagnosis not present

## 2012-10-18 DIAGNOSIS — I4891 Unspecified atrial fibrillation: Secondary | ICD-10-CM | POA: Diagnosis not present

## 2012-10-18 DIAGNOSIS — I2589 Other forms of chronic ischemic heart disease: Secondary | ICD-10-CM | POA: Diagnosis not present

## 2012-10-18 LAB — CBC WITH DIFFERENTIAL/PLATELET
Basophils Relative: 0.3 % (ref 0.0–3.0)
Eosinophils Absolute: 0.2 10*3/uL (ref 0.0–0.7)
Eosinophils Relative: 1.8 % (ref 0.0–5.0)
Hemoglobin: 9.8 g/dL — ABNORMAL LOW (ref 12.0–15.0)
Lymphocytes Relative: 10.2 % — ABNORMAL LOW (ref 12.0–46.0)
MCHC: 32.6 g/dL (ref 30.0–36.0)
Monocytes Relative: 5 % (ref 3.0–12.0)
Neutro Abs: 7.6 10*3/uL (ref 1.4–7.7)
Neutrophils Relative %: 82.7 % — ABNORMAL HIGH (ref 43.0–77.0)
RBC: 3.35 Mil/uL — ABNORMAL LOW (ref 3.87–5.11)
WBC: 9.1 10*3/uL (ref 4.5–10.5)

## 2012-10-18 LAB — BASIC METABOLIC PANEL
Calcium: 8.9 mg/dL (ref 8.4–10.5)
Creatinine, Ser: 1.4 mg/dL — ABNORMAL HIGH (ref 0.4–1.2)
GFR: 39.03 mL/min — ABNORMAL LOW (ref >60.00)
Sodium: 139 mEq/L (ref 135–145)

## 2012-10-18 LAB — IBC PANEL
Iron: 51 ug/dL (ref 42–145)
Saturation Ratios: 11 % — ABNORMAL LOW (ref 20.0–50.0)
Transferrin: 331.4 mg/dL (ref 212.0–360.0)

## 2012-10-18 LAB — TSH: TSH: 3.91 u[IU]/mL (ref 0.35–5.50)

## 2012-10-18 NOTE — Patient Instructions (Addendum)

## 2012-10-18 NOTE — Progress Notes (Signed)
Subjective:    Patient ID: [name], female    DOB: 02/12/1941, 72 y.o.   MRN: [id number]  Hypertension This is a chronic problem. The current episode started more than 1 year ago. The problem is unchanged. The problem is controlled. Associated symptoms include malaise/fatigue and peripheral edema. Pertinent negatives include no anxiety, blurred vision, chest pain, headaches, neck pain, orthopnea, palpitations, PND, shortness of breath or sweats. Past treatments include beta blockers, diuretics and angiotensin blockers. The current treatment provides significant improvement. There are no compliance problems.  Hypertensive end-organ damage includes heart failure. There is no history of CVA or left ventricular hypertrophy.      Review of Systems  Constitutional: Positive for malaise/fatigue and unexpected weight change (weight loss). Negative for fever, chills, diaphoresis, appetite change and fatigue.  HENT: Negative.  Negative for neck pain.   Eyes: Negative.  Negative for blurred vision.  Respiratory: Negative.  Negative for apnea, cough, choking, chest tightness, shortness of breath, wheezing and stridor.   Cardiovascular: Negative for chest pain, palpitations, orthopnea, leg swelling and PND.  Gastrointestinal: Negative.  Negative for nausea, vomiting, abdominal pain, diarrhea, constipation and abdominal distention.  Endocrine: Negative.  Negative for polydipsia, polyphagia and polyuria.  Genitourinary: Negative.   Musculoskeletal: Negative.  Negative for myalgias, back pain, joint swelling and gait problem.  Skin: Negative.   Allergic/Immunologic: Negative.   Neurological: Negative for dizziness, tremors, syncope, weakness, light-headedness, numbness and headaches.  Hematological: Negative.  Negative for adenopathy. Does not bruise/bleed easily.  Psychiatric/Behavioral: Negative.        Objective:   Physical Exam  Vitals reviewed. Constitutional: She is oriented to person,  place, and time. She appears well-developed and well-nourished. No distress.  HENT:  Head: Normocephalic and atraumatic.  Mouth/Throat: Oropharynx is clear and moist. No oropharyngeal exudate.  Eyes: Conjunctivae are normal. Right eye exhibits no discharge. Left eye exhibits no discharge. No scleral icterus.  Neck: Normal range of motion. Neck supple. No JVD present. No tracheal deviation present. No thyromegaly present.  Cardiovascular: Normal rate, regular rhythm, S1 normal, S2 normal and intact distal pulses.  Exam reveals no S3 and no S4.   Murmur heard.  Decrescendo systolic murmur is present with a grade of 2/6   No diastolic murmur is present  Pulmonary/Chest: Effort normal and breath sounds normal. No stridor. No respiratory distress. She has no wheezes. She has no rales. She exhibits no tenderness.  Abdominal: Soft. Bowel sounds are normal. She exhibits no distension and no mass. There is no tenderness. There is no rebound and no guarding.  Musculoskeletal: Normal range of motion. She exhibits edema (there is trace pitting edema in BLE). She exhibits no tenderness.  +kyphosis  Lymphadenopathy:    She has no cervical adenopathy.  Neurological: She is oriented to person, place, and time.  Skin: Skin is warm and dry. No rash noted. She is not diaphoretic. No erythema. No pallor.  Psychiatric: She has a normal mood and affect. Her behavior is normal. Judgment and thought content normal.     Lab Results  Component Value Date   WBC 7.5 06/12/2012   HGB 9.5* 06/12/2012   HCT 29.8* 06/12/2012   PLT 270 06/12/2012   GLUCOSE 108* 08/31/2012   CHOL 200 04/08/2011   TRIG 110.0 04/08/2011   HDL 62.70 04/08/2011   LDLDIRECT 132.3 10/17/2008   LDLCALC 115* 04/08/2011   ALT 11 08/31/2012   AST 13 08/31/2012   NA 138 08/31/2012   K 4.6 08/31/2012  CL 105 08/31/2012   CREATININE 1.2 08/31/2012   BUN 20 08/31/2012   CO2 27 08/31/2012   TSH 3.84 08/31/2012   INR 3.8 10/16/2012   HGBA1C 6.2 12/27/2011    MICROALBUR 1.1 10/17/2008       Assessment & Plan:

## 2012-10-18 NOTE — Assessment & Plan Note (Signed)
The software blocked me from ordering a Vit D level

## 2012-10-18 NOTE — Assessment & Plan Note (Signed)
The software blocked me from ordering a B12 and folate level

## 2012-10-19 ENCOUNTER — Encounter: Payer: Self-pay | Admitting: Internal Medicine

## 2012-10-19 NOTE — Assessment & Plan Note (Signed)
Her BP is well controlled 

## 2012-10-19 NOTE — Assessment & Plan Note (Signed)
Her A1C is down to 6 and her creatinine is up to 1.7 so I have asked her to stop taking metformin

## 2012-10-19 NOTE — Assessment & Plan Note (Signed)
Will stop metformin and avoid nephrotoxic agents

## 2012-10-19 NOTE — Assessment & Plan Note (Signed)
This is stable and her iron level is normal

## 2012-10-20 ENCOUNTER — Encounter (HOSPITAL_COMMUNITY)
Admission: RE | Admit: 2012-10-20 | Discharge: 2012-10-20 | Disposition: A | Discharge status: Home / Self Care | Payer: Medicare Other | Source: Ambulatory Visit | Attending: Cardiology | Admitting: Cardiology

## 2012-10-20 DIAGNOSIS — Z8674 Personal history of sudden cardiac arrest: Secondary | ICD-10-CM | POA: Diagnosis not present

## 2012-10-20 DIAGNOSIS — Z5189 Encounter for other specified aftercare: Secondary | ICD-10-CM | POA: Diagnosis not present

## 2012-10-20 DIAGNOSIS — I509 Heart failure, unspecified: Secondary | ICD-10-CM | POA: Diagnosis not present

## 2012-10-20 DIAGNOSIS — I2589 Other forms of chronic ischemic heart disease: Secondary | ICD-10-CM | POA: Diagnosis not present

## 2012-10-20 DIAGNOSIS — I4891 Unspecified atrial fibrillation: Secondary | ICD-10-CM | POA: Diagnosis not present

## 2012-10-20 DIAGNOSIS — I5022 Chronic systolic (congestive) heart failure: Secondary | ICD-10-CM | POA: Diagnosis not present

## 2012-10-23 ENCOUNTER — Encounter (HOSPITAL_COMMUNITY)
Admission: RE | Admit: 2012-10-23 | Discharge: 2012-10-23 | Disposition: A | Discharge status: Home / Self Care | Payer: Medicare Other | Source: Ambulatory Visit | Attending: Cardiology | Admitting: Cardiology

## 2012-10-23 DIAGNOSIS — Z5189 Encounter for other specified aftercare: Secondary | ICD-10-CM | POA: Diagnosis not present

## 2012-10-23 DIAGNOSIS — I509 Heart failure, unspecified: Secondary | ICD-10-CM | POA: Diagnosis not present

## 2012-10-23 DIAGNOSIS — Z8674 Personal history of sudden cardiac arrest: Secondary | ICD-10-CM | POA: Diagnosis not present

## 2012-10-23 DIAGNOSIS — I2589 Other forms of chronic ischemic heart disease: Secondary | ICD-10-CM | POA: Diagnosis not present

## 2012-10-23 DIAGNOSIS — I4891 Unspecified atrial fibrillation: Secondary | ICD-10-CM | POA: Diagnosis not present

## 2012-10-23 DIAGNOSIS — I5022 Chronic systolic (congestive) heart failure: Secondary | ICD-10-CM | POA: Diagnosis not present

## 2012-10-25 ENCOUNTER — Encounter (HOSPITAL_COMMUNITY)
Admission: RE | Admit: 2012-10-25 | Discharge: 2012-10-25 | Disposition: A | Discharge status: Home / Self Care | Payer: Medicare Other | Source: Ambulatory Visit | Attending: Cardiology | Admitting: Cardiology

## 2012-10-25 DIAGNOSIS — I509 Heart failure, unspecified: Secondary | ICD-10-CM | POA: Diagnosis not present

## 2012-10-25 DIAGNOSIS — Z5189 Encounter for other specified aftercare: Secondary | ICD-10-CM | POA: Diagnosis not present

## 2012-10-25 DIAGNOSIS — I2589 Other forms of chronic ischemic heart disease: Secondary | ICD-10-CM | POA: Diagnosis not present

## 2012-10-25 DIAGNOSIS — I5022 Chronic systolic (congestive) heart failure: Secondary | ICD-10-CM | POA: Diagnosis not present

## 2012-10-25 DIAGNOSIS — Z8674 Personal history of sudden cardiac arrest: Secondary | ICD-10-CM | POA: Diagnosis not present

## 2012-10-25 DIAGNOSIS — I4891 Unspecified atrial fibrillation: Secondary | ICD-10-CM | POA: Diagnosis not present

## 2012-10-27 ENCOUNTER — Encounter (HOSPITAL_COMMUNITY)
Admission: RE | Admit: 2012-10-27 | Discharge: 2012-10-27 | Disposition: A | Discharge status: Home / Self Care | Payer: Medicare Other | Source: Ambulatory Visit | Attending: Cardiology | Admitting: Cardiology

## 2012-10-27 DIAGNOSIS — I5022 Chronic systolic (congestive) heart failure: Secondary | ICD-10-CM | POA: Diagnosis not present

## 2012-10-27 DIAGNOSIS — I2589 Other forms of chronic ischemic heart disease: Secondary | ICD-10-CM | POA: Diagnosis not present

## 2012-10-27 DIAGNOSIS — Z8674 Personal history of sudden cardiac arrest: Secondary | ICD-10-CM | POA: Diagnosis not present

## 2012-10-27 DIAGNOSIS — I4891 Unspecified atrial fibrillation: Secondary | ICD-10-CM | POA: Diagnosis not present

## 2012-10-27 DIAGNOSIS — Z5189 Encounter for other specified aftercare: Secondary | ICD-10-CM | POA: Diagnosis not present

## 2012-10-27 DIAGNOSIS — I509 Heart failure, unspecified: Secondary | ICD-10-CM | POA: Diagnosis not present

## 2012-10-30 ENCOUNTER — Encounter (HOSPITAL_COMMUNITY)
Admission: RE | Admit: 2012-10-30 | Discharge: 2012-10-30 | Disposition: A | Discharge status: Home / Self Care | Payer: Medicare Other | Source: Ambulatory Visit | Attending: Cardiology | Admitting: Cardiology

## 2012-10-30 ENCOUNTER — Ambulatory Visit (INDEPENDENT_AMBULATORY_CARE_PROVIDER_SITE_OTHER): Payer: Medicare Other | Admitting: Pharmacist

## 2012-10-30 DIAGNOSIS — I359 Nonrheumatic aortic valve disorder, unspecified: Secondary | ICD-10-CM

## 2012-10-30 DIAGNOSIS — I2589 Other forms of chronic ischemic heart disease: Secondary | ICD-10-CM | POA: Diagnosis not present

## 2012-10-30 DIAGNOSIS — I509 Heart failure, unspecified: Secondary | ICD-10-CM | POA: Diagnosis not present

## 2012-10-30 DIAGNOSIS — I059 Rheumatic mitral valve disease, unspecified: Secondary | ICD-10-CM | POA: Diagnosis not present

## 2012-10-30 DIAGNOSIS — Z7901 Long term (current) use of anticoagulants: Secondary | ICD-10-CM | POA: Diagnosis not present

## 2012-10-30 DIAGNOSIS — I4891 Unspecified atrial fibrillation: Secondary | ICD-10-CM

## 2012-10-30 DIAGNOSIS — Z8674 Personal history of sudden cardiac arrest: Secondary | ICD-10-CM | POA: Diagnosis not present

## 2012-10-30 DIAGNOSIS — I5022 Chronic systolic (congestive) heart failure: Secondary | ICD-10-CM | POA: Diagnosis not present

## 2012-10-30 DIAGNOSIS — Z5189 Encounter for other specified aftercare: Secondary | ICD-10-CM | POA: Diagnosis not present

## 2012-10-30 LAB — POCT INR: INR: 3.2

## 2012-10-30 LAB — GLUCOSE, CAPILLARY: Glucose-Capillary: 119 mg/dL — ABNORMAL HIGH (ref 70–99)

## 2012-10-31 ENCOUNTER — Telehealth: Payer: Self-pay | Admitting: Cardiology

## 2012-10-31 NOTE — Telephone Encounter (Signed)
Spoke with pt, questions regarding labs and medication answered.

## 2012-10-31 NOTE — Telephone Encounter (Signed)
New problem   Pt has questions about her meds. Please call pt.

## 2012-11-01 ENCOUNTER — Encounter (HOSPITAL_COMMUNITY)
Admission: RE | Admit: 2012-11-01 | Discharge: 2012-11-01 | Disposition: A | Discharge status: Home / Self Care | Payer: Medicare Other | Source: Ambulatory Visit | Attending: Cardiology | Admitting: Cardiology

## 2012-11-01 DIAGNOSIS — I5022 Chronic systolic (congestive) heart failure: Secondary | ICD-10-CM | POA: Diagnosis not present

## 2012-11-01 DIAGNOSIS — I2589 Other forms of chronic ischemic heart disease: Secondary | ICD-10-CM | POA: Diagnosis not present

## 2012-11-01 DIAGNOSIS — I4891 Unspecified atrial fibrillation: Secondary | ICD-10-CM | POA: Diagnosis not present

## 2012-11-01 DIAGNOSIS — Z8674 Personal history of sudden cardiac arrest: Secondary | ICD-10-CM | POA: Diagnosis not present

## 2012-11-01 DIAGNOSIS — Z5189 Encounter for other specified aftercare: Secondary | ICD-10-CM | POA: Diagnosis not present

## 2012-11-01 DIAGNOSIS — I509 Heart failure, unspecified: Secondary | ICD-10-CM | POA: Diagnosis not present

## 2012-11-03 ENCOUNTER — Encounter (HOSPITAL_COMMUNITY)
Admission: RE | Admit: 2012-11-03 | Discharge: 2012-11-03 | Disposition: A | Discharge status: Home / Self Care | Payer: Medicare Other | Source: Ambulatory Visit | Attending: Cardiology | Admitting: Cardiology

## 2012-11-03 DIAGNOSIS — I509 Heart failure, unspecified: Secondary | ICD-10-CM | POA: Diagnosis not present

## 2012-11-03 DIAGNOSIS — Z5189 Encounter for other specified aftercare: Secondary | ICD-10-CM | POA: Diagnosis not present

## 2012-11-03 DIAGNOSIS — I4891 Unspecified atrial fibrillation: Secondary | ICD-10-CM | POA: Diagnosis not present

## 2012-11-03 DIAGNOSIS — I2589 Other forms of chronic ischemic heart disease: Secondary | ICD-10-CM | POA: Diagnosis not present

## 2012-11-03 DIAGNOSIS — Z8674 Personal history of sudden cardiac arrest: Secondary | ICD-10-CM | POA: Diagnosis not present

## 2012-11-03 DIAGNOSIS — I5022 Chronic systolic (congestive) heart failure: Secondary | ICD-10-CM | POA: Diagnosis not present

## 2012-11-06 ENCOUNTER — Encounter (HOSPITAL_COMMUNITY): Payer: Medicare Other

## 2012-11-06 DIAGNOSIS — Z5189 Encounter for other specified aftercare: Secondary | ICD-10-CM | POA: Insufficient documentation

## 2012-11-06 DIAGNOSIS — Z8674 Personal history of sudden cardiac arrest: Secondary | ICD-10-CM | POA: Insufficient documentation

## 2012-11-06 DIAGNOSIS — I2510 Atherosclerotic heart disease of native coronary artery without angina pectoris: Secondary | ICD-10-CM | POA: Insufficient documentation

## 2012-11-06 DIAGNOSIS — Z954 Presence of other heart-valve replacement: Secondary | ICD-10-CM | POA: Insufficient documentation

## 2012-11-06 DIAGNOSIS — I4891 Unspecified atrial fibrillation: Secondary | ICD-10-CM | POA: Insufficient documentation

## 2012-11-06 DIAGNOSIS — I2589 Other forms of chronic ischemic heart disease: Secondary | ICD-10-CM | POA: Insufficient documentation

## 2012-11-06 DIAGNOSIS — I5022 Chronic systolic (congestive) heart failure: Secondary | ICD-10-CM | POA: Insufficient documentation

## 2012-11-06 DIAGNOSIS — I509 Heart failure, unspecified: Secondary | ICD-10-CM | POA: Insufficient documentation

## 2012-11-08 ENCOUNTER — Encounter (HOSPITAL_COMMUNITY)
Admission: RE | Admit: 2012-11-08 | Discharge: 2012-11-08 | Disposition: A | Discharge status: Home / Self Care | Payer: Medicare Other | Source: Ambulatory Visit | Attending: Cardiology | Admitting: Cardiology

## 2012-11-08 DIAGNOSIS — I4891 Unspecified atrial fibrillation: Secondary | ICD-10-CM | POA: Diagnosis not present

## 2012-11-08 DIAGNOSIS — I5022 Chronic systolic (congestive) heart failure: Secondary | ICD-10-CM | POA: Diagnosis not present

## 2012-11-08 DIAGNOSIS — Z5189 Encounter for other specified aftercare: Secondary | ICD-10-CM | POA: Diagnosis not present

## 2012-11-08 DIAGNOSIS — I2589 Other forms of chronic ischemic heart disease: Secondary | ICD-10-CM | POA: Diagnosis not present

## 2012-11-08 DIAGNOSIS — I509 Heart failure, unspecified: Secondary | ICD-10-CM | POA: Diagnosis not present

## 2012-11-08 DIAGNOSIS — Z8674 Personal history of sudden cardiac arrest: Secondary | ICD-10-CM | POA: Diagnosis not present

## 2012-11-08 DIAGNOSIS — Z954 Presence of other heart-valve replacement: Secondary | ICD-10-CM | POA: Diagnosis not present

## 2012-11-08 DIAGNOSIS — I2510 Atherosclerotic heart disease of native coronary artery without angina pectoris: Secondary | ICD-10-CM | POA: Diagnosis not present

## 2012-11-10 ENCOUNTER — Encounter (HOSPITAL_COMMUNITY)
Admission: RE | Admit: 2012-11-10 | Discharge: 2012-11-10 | Disposition: A | Discharge status: Home / Self Care | Payer: Medicare Other | Source: Ambulatory Visit | Attending: Cardiology | Admitting: Cardiology

## 2012-11-10 DIAGNOSIS — I509 Heart failure, unspecified: Secondary | ICD-10-CM | POA: Diagnosis not present

## 2012-11-10 DIAGNOSIS — I4891 Unspecified atrial fibrillation: Secondary | ICD-10-CM | POA: Diagnosis not present

## 2012-11-10 DIAGNOSIS — I5022 Chronic systolic (congestive) heart failure: Secondary | ICD-10-CM | POA: Diagnosis not present

## 2012-11-10 DIAGNOSIS — I2589 Other forms of chronic ischemic heart disease: Secondary | ICD-10-CM | POA: Diagnosis not present

## 2012-11-10 DIAGNOSIS — Z8674 Personal history of sudden cardiac arrest: Secondary | ICD-10-CM | POA: Diagnosis not present

## 2012-11-10 DIAGNOSIS — Z5189 Encounter for other specified aftercare: Secondary | ICD-10-CM | POA: Diagnosis not present

## 2012-11-13 ENCOUNTER — Ambulatory Visit (INDEPENDENT_AMBULATORY_CARE_PROVIDER_SITE_OTHER): Payer: Medicare Other | Admitting: *Deleted

## 2012-11-13 ENCOUNTER — Encounter (HOSPITAL_COMMUNITY)
Admission: RE | Admit: 2012-11-13 | Discharge: 2012-11-13 | Disposition: A | Discharge status: Home / Self Care | Payer: Medicare Other | Source: Ambulatory Visit | Attending: Cardiology | Admitting: Cardiology

## 2012-11-13 DIAGNOSIS — I059 Rheumatic mitral valve disease, unspecified: Secondary | ICD-10-CM

## 2012-11-13 DIAGNOSIS — Z5189 Encounter for other specified aftercare: Secondary | ICD-10-CM | POA: Diagnosis not present

## 2012-11-13 DIAGNOSIS — I2589 Other forms of chronic ischemic heart disease: Secondary | ICD-10-CM | POA: Diagnosis not present

## 2012-11-13 DIAGNOSIS — I359 Nonrheumatic aortic valve disorder, unspecified: Secondary | ICD-10-CM

## 2012-11-13 DIAGNOSIS — Z8674 Personal history of sudden cardiac arrest: Secondary | ICD-10-CM | POA: Diagnosis not present

## 2012-11-13 DIAGNOSIS — Z7901 Long term (current) use of anticoagulants: Secondary | ICD-10-CM

## 2012-11-13 DIAGNOSIS — I4891 Unspecified atrial fibrillation: Secondary | ICD-10-CM | POA: Diagnosis not present

## 2012-11-13 DIAGNOSIS — I509 Heart failure, unspecified: Secondary | ICD-10-CM | POA: Diagnosis not present

## 2012-11-13 DIAGNOSIS — I5022 Chronic systolic (congestive) heart failure: Secondary | ICD-10-CM | POA: Diagnosis not present

## 2012-11-15 ENCOUNTER — Encounter (HOSPITAL_COMMUNITY)
Admission: RE | Admit: 2012-11-15 | Discharge: 2012-11-15 | Disposition: A | Discharge status: Home / Self Care | Payer: Medicare Other | Source: Ambulatory Visit | Attending: Cardiology | Admitting: Cardiology

## 2012-11-15 DIAGNOSIS — I2589 Other forms of chronic ischemic heart disease: Secondary | ICD-10-CM | POA: Diagnosis not present

## 2012-11-15 DIAGNOSIS — I509 Heart failure, unspecified: Secondary | ICD-10-CM | POA: Diagnosis not present

## 2012-11-15 DIAGNOSIS — I5022 Chronic systolic (congestive) heart failure: Secondary | ICD-10-CM | POA: Diagnosis not present

## 2012-11-15 DIAGNOSIS — Z8674 Personal history of sudden cardiac arrest: Secondary | ICD-10-CM | POA: Diagnosis not present

## 2012-11-15 DIAGNOSIS — Z5189 Encounter for other specified aftercare: Secondary | ICD-10-CM | POA: Diagnosis not present

## 2012-11-15 DIAGNOSIS — I4891 Unspecified atrial fibrillation: Secondary | ICD-10-CM | POA: Diagnosis not present

## 2012-11-17 ENCOUNTER — Encounter (HOSPITAL_COMMUNITY): Payer: Medicare Other

## 2012-11-20 ENCOUNTER — Encounter (HOSPITAL_COMMUNITY)
Admission: RE | Admit: 2012-11-20 | Discharge: 2012-11-20 | Disposition: A | Discharge status: Home / Self Care | Payer: Medicare Other | Source: Ambulatory Visit | Attending: Cardiology | Admitting: Cardiology

## 2012-11-20 DIAGNOSIS — I2589 Other forms of chronic ischemic heart disease: Secondary | ICD-10-CM | POA: Diagnosis not present

## 2012-11-20 DIAGNOSIS — I509 Heart failure, unspecified: Secondary | ICD-10-CM | POA: Diagnosis not present

## 2012-11-20 DIAGNOSIS — I4891 Unspecified atrial fibrillation: Secondary | ICD-10-CM | POA: Diagnosis not present

## 2012-11-20 DIAGNOSIS — Z8674 Personal history of sudden cardiac arrest: Secondary | ICD-10-CM | POA: Diagnosis not present

## 2012-11-20 DIAGNOSIS — Z5189 Encounter for other specified aftercare: Secondary | ICD-10-CM | POA: Diagnosis not present

## 2012-11-20 DIAGNOSIS — I5022 Chronic systolic (congestive) heart failure: Secondary | ICD-10-CM | POA: Diagnosis not present

## 2012-11-22 ENCOUNTER — Encounter (HOSPITAL_COMMUNITY)
Admission: RE | Admit: 2012-11-22 | Discharge: 2012-11-22 | Disposition: A | Discharge status: Home / Self Care | Payer: Medicare Other | Source: Ambulatory Visit | Attending: Cardiology | Admitting: Cardiology

## 2012-11-22 DIAGNOSIS — I2589 Other forms of chronic ischemic heart disease: Secondary | ICD-10-CM | POA: Diagnosis not present

## 2012-11-22 DIAGNOSIS — I5022 Chronic systolic (congestive) heart failure: Secondary | ICD-10-CM | POA: Diagnosis not present

## 2012-11-22 DIAGNOSIS — I4891 Unspecified atrial fibrillation: Secondary | ICD-10-CM | POA: Diagnosis not present

## 2012-11-22 DIAGNOSIS — Z8674 Personal history of sudden cardiac arrest: Secondary | ICD-10-CM | POA: Diagnosis not present

## 2012-11-22 DIAGNOSIS — I509 Heart failure, unspecified: Secondary | ICD-10-CM | POA: Diagnosis not present

## 2012-11-22 DIAGNOSIS — Z5189 Encounter for other specified aftercare: Secondary | ICD-10-CM | POA: Diagnosis not present

## 2012-11-24 ENCOUNTER — Encounter (HOSPITAL_COMMUNITY)
Admission: RE | Admit: 2012-11-24 | Discharge: 2012-11-24 | Disposition: A | Discharge status: Home / Self Care | Payer: Medicare Other | Source: Ambulatory Visit | Attending: Cardiology | Admitting: Cardiology

## 2012-11-24 DIAGNOSIS — I509 Heart failure, unspecified: Secondary | ICD-10-CM | POA: Diagnosis not present

## 2012-11-24 DIAGNOSIS — I4891 Unspecified atrial fibrillation: Secondary | ICD-10-CM | POA: Diagnosis not present

## 2012-11-24 DIAGNOSIS — I2589 Other forms of chronic ischemic heart disease: Secondary | ICD-10-CM | POA: Diagnosis not present

## 2012-11-24 DIAGNOSIS — I5022 Chronic systolic (congestive) heart failure: Secondary | ICD-10-CM | POA: Diagnosis not present

## 2012-11-24 DIAGNOSIS — Z5189 Encounter for other specified aftercare: Secondary | ICD-10-CM | POA: Diagnosis not present

## 2012-11-24 DIAGNOSIS — Z8674 Personal history of sudden cardiac arrest: Secondary | ICD-10-CM | POA: Diagnosis not present

## 2012-11-27 ENCOUNTER — Encounter (HOSPITAL_COMMUNITY)
Admission: RE | Admit: 2012-11-27 | Discharge: 2012-11-27 | Disposition: A | Discharge status: Home / Self Care | Payer: Medicare Other | Source: Ambulatory Visit | Attending: Cardiology | Admitting: Cardiology

## 2012-11-27 ENCOUNTER — Ambulatory Visit: Payer: Medicare Other | Admitting: Cardiology

## 2012-11-27 DIAGNOSIS — Z8674 Personal history of sudden cardiac arrest: Secondary | ICD-10-CM | POA: Diagnosis not present

## 2012-11-27 DIAGNOSIS — I5022 Chronic systolic (congestive) heart failure: Secondary | ICD-10-CM | POA: Diagnosis not present

## 2012-11-27 DIAGNOSIS — I509 Heart failure, unspecified: Secondary | ICD-10-CM | POA: Diagnosis not present

## 2012-11-27 DIAGNOSIS — I4891 Unspecified atrial fibrillation: Secondary | ICD-10-CM | POA: Diagnosis not present

## 2012-11-27 DIAGNOSIS — Z5189 Encounter for other specified aftercare: Secondary | ICD-10-CM | POA: Diagnosis not present

## 2012-11-27 DIAGNOSIS — I2589 Other forms of chronic ischemic heart disease: Secondary | ICD-10-CM | POA: Diagnosis not present

## 2012-11-28 ENCOUNTER — Encounter: Payer: Self-pay | Admitting: Cardiology

## 2012-11-28 ENCOUNTER — Ambulatory Visit (INDEPENDENT_AMBULATORY_CARE_PROVIDER_SITE_OTHER): Payer: Medicare Other | Admitting: Cardiology

## 2012-11-28 ENCOUNTER — Ambulatory Visit (INDEPENDENT_AMBULATORY_CARE_PROVIDER_SITE_OTHER): Payer: Medicare Other | Admitting: *Deleted

## 2012-11-28 VITALS — BP 130/64 | HR 73 | Ht 63.5 in | Wt 155.0 lb

## 2012-11-28 DIAGNOSIS — I4891 Unspecified atrial fibrillation: Secondary | ICD-10-CM | POA: Diagnosis not present

## 2012-11-28 DIAGNOSIS — I359 Nonrheumatic aortic valve disorder, unspecified: Secondary | ICD-10-CM | POA: Diagnosis not present

## 2012-11-28 DIAGNOSIS — I059 Rheumatic mitral valve disease, unspecified: Secondary | ICD-10-CM

## 2012-11-28 DIAGNOSIS — I38 Endocarditis, valve unspecified: Secondary | ICD-10-CM | POA: Diagnosis not present

## 2012-11-28 DIAGNOSIS — Z7901 Long term (current) use of anticoagulants: Secondary | ICD-10-CM | POA: Diagnosis not present

## 2012-11-28 DIAGNOSIS — Z95810 Presence of automatic (implantable) cardiac defibrillator: Secondary | ICD-10-CM | POA: Diagnosis not present

## 2012-11-28 NOTE — Assessment & Plan Note (Signed)
Continue ARB and beta blocker. Plan repeat echocardiogram when she returns in 3 months.

## 2012-11-28 NOTE — Progress Notes (Signed)
HPI: Pleasant female with history of severe AS, mitral regurgitation, aneurysm of the ascending thoracic aorta (status post aortic valve replacement, mitral valve repair, resection and grafting of the ascending thoracic aortic aneurysm on Jul 07, 2006), history of paroxysmal atrial fibrillation (status post maze procedure on Jul 07, 2006). Followup catheterization performed secondary to elevated troponin and recurrent atrial fibrillation revealed an ejection fraction of 25-30% and occlusion of the reimplanted left main. The patient subsequently underwent redo coronary artery bypass and graft with a LIMA to the LAD and a saphenous vein graft to the OM. She also had an epicardial lead placed if she required biventricular pacing in the future. A followup echocardiogram was performed in October of 2013. Her ejection fraction was 35-40%. There was mild perivalvular aortic insufficiency. There was mild mitral stenosis and mild residual mitral regurgitation. There was mild to moderate tricuspid regurgitation. Mild biatrial enlargement. MUGA was performed in October of 2013 and showed an ejection fraction of 35%. Because patient's LV function was reduced a Myoview was performed. There was a fixed inferior defect consistent with thinning and a partially reversible anterior defect consistent with prior infarct and mild peri-infarct ischemia. Ejection fraction 33%, global hypokinesis and anteroseptal akinesis. Cardiac catheterization repeated in January of 2014. The left main was occluded. The LAD, circumflex and right coronary artery were patent. The LIMA to the LAD was patent but atretic. Saphenous vein graft to the obtuse marginal was patent. This graft filled the obtuse marginal and ramus intermedius and the entire LAD filled retrograde from this graft. Ejection fraction was 35%. Patient referred to Dr. Rayann Heman for consideration of ICD; also placed on tikosyn for atrial fibrillation. Patient then admitted in March of 2014  following a ventricular fibrillation arrest. She required a prolonged resuscitation. Her QT was prolonged and her tikosyn DCed. Her hospital course was complicated by a DVT in her left arm. She ultimately had a [employer] ICD implanted. Amiodarone initiated. Since I last saw her in June of 2014, she denies dyspnea, chest pain, palpitations or syncope.    Current Outpatient Prescriptions  Medication Sig Dispense Refill  . amiodarone (PACERONE) 200 MG tablet 200 mg daily.       . carvedilol (COREG) 12.5 MG tablet Take 1 tablet (12.5 mg total) by mouth 2 (two) times daily.  90 tablet  4  . Cholecalciferol (VITAMIN D3) 50000 UNITS CAPS Take 1 tablet by mouth once a week.  12 capsule  3  . Cyanocobalamin (VITAMIN B-12) 2500 MCG SUBL Place 1 tablet under the tongue 3 (three) times a week. Dissolves 1 tablet under the tongue on Monday, Wednesday, and Friday      . furosemide (LASIX) 20 MG tablet Take 20 mg by mouth daily.       Marland Kitchen losartan (COZAAR) 50 MG tablet Take 1 tablet (50 mg total) by mouth daily.  90 tablet  4  . omeprazole (PRILOSEC OTC) 20 MG tablet Take 20 mg by mouth daily before breakfast.       . warfarin (COUMADIN) 3 MG tablet Take as directed by coumadin clinic  30 tablet  3   No current facility-administered medications for this visit.     Past Medical History  Diagnosis Date  . Anemia   . Atrial fibrillation     A.fib/flutter: s/p MAZE 2008, DCCV 2011, on coumadin; previously on flecainide, but dc'd due to worsening EF. Tikosyn discontinued 06/2012 after cardiac arrest.  . Diverticulitis   . CVA (cerebral vascular accident)  2008 - felt due to oscillating calcium on aortic valve  . Carcinoma in situ of vulva   . Fibroid   . Ovarian cyst, left 2008-2009  . Osteoporosis   . Diabetes mellitus   . Hypertension   . GERD (gastroesophageal reflux disease)   . Chronic systolic CHF (congestive heart failure)     EF 35%  . Ischemic cardiomyopathy     EF 35%  . Coronary artery  disease     Occluded LM 2/2 previous aortic root surgery; s/p 2v CABG 2010 LIMA to LAD, SVG to OM; Cath 03/11/12 patent SVG to OM, atretic LIMA to LAD, patent RCA, occluded native LM, EF 35%   . Arthritis     OSTEO  . Fracture of lumbar spine   . 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  . Superficial venous thrombosis of upper extremity     06/2012 - LUE  . Severe aortic stenosis     s/p homograft aortic root replacement 2008  . Mitral regurgitation     s/p mitral valve repair 2008  . Thoracic aortic aneurysm     s/p resection and grafting 2008    Past Surgical History  Procedure Laterality Date  . Foot surgery      removed neuroma  . Aortic root replacement  2008    homograft  . Mv repair  2008    due to severe MR  . Resection and grafting of ascending thoracic aortic    . Aneurysm and left sided maze  2008  . Coronary artery bypass graft  4/10    LIMA to LAD, SVG to OM  . Wide excision of vulva      CA INSITU    History   Social History  . Marital Status: Married    Spouse Name: N/A    Number of Children: N/A  . Years of Education: N/A   Occupational History  . Retired Radiation protection practitioner    Social History Main Topics  . Smoking status: Former Smoker    Quit date: 03/09/1983  . Smokeless tobacco: Never Used  . Alcohol Use: No  . Drug Use: No  . Sexual Activity: Not Currently    Birth Control/ Protection: Post-menopausal   Other Topics Concern  . Not on file   Social History Narrative    Her mother died at age 56 from a stroke.  Her father died at age 64, had chronic obstructive pulmonary disease and colon disease.   No regular exercise    ROS: no fevers or chills, productive cough, hemoptysis, dysphasia, odynophagia, melena, hematochezia, dysuria, hematuria, rash, seizure activity, orthopnea, PND, pedal edema,  claudication. Remaining systems are negative.  Physical Exam: Well-developed well-nourished in no acute distress.  Skin is warm and dry.  HEENT is normal.  Neck is supple.  Chest is clear to auscultation with normal expansion.  Cardiovascular exam is regular rate and rhythm. 2/6 systolic murmur apex and left sternal border. Abdominal exam nontender or distended. No masses palpated. Extremities show no edema. neuro grossly intact  ECG sinus rhythm at a rate of 73. Left anterior fascicular block. Cannot rule out prior septal infarct. IVCD.

## 2012-11-28 NOTE — Patient Instructions (Addendum)
**Note De-identified Duha Abair Obfuscation** Your physician recommends that you continue on your current medications as directed. Please refer to the Current Medication list given to you today.  Your physician recommends that you schedule a follow-up appointment in: 3 months  

## 2012-11-28 NOTE — Assessment & Plan Note (Signed)
Patient remains in sinus rhythm. Continue amiodarone. Check TSH, liver functions and chest x-ray when she returns in 3 months. Continue Coumadin.

## 2012-11-28 NOTE — Assessment & Plan Note (Signed)
Blood pressure controlled. Continue present medications. 

## 2012-11-28 NOTE — Assessment & Plan Note (Signed)
Continue SBE prophylaxis. Repeat echocardiogram in 3 months.

## 2012-11-28 NOTE — Assessment & Plan Note (Signed)
Continue SBE prophylaxis. 

## 2012-11-28 NOTE — Assessment & Plan Note (Signed)
Followed by electrophysiology. 

## 2012-11-29 ENCOUNTER — Encounter (HOSPITAL_COMMUNITY)
Admission: RE | Admit: 2012-11-29 | Discharge: 2012-11-29 | Disposition: A | Discharge status: Home / Self Care | Payer: Medicare Other | Source: Ambulatory Visit | Attending: Cardiology | Admitting: Cardiology

## 2012-11-29 DIAGNOSIS — I509 Heart failure, unspecified: Secondary | ICD-10-CM | POA: Diagnosis not present

## 2012-11-29 DIAGNOSIS — I2589 Other forms of chronic ischemic heart disease: Secondary | ICD-10-CM | POA: Diagnosis not present

## 2012-11-29 DIAGNOSIS — I5022 Chronic systolic (congestive) heart failure: Secondary | ICD-10-CM | POA: Diagnosis not present

## 2012-11-29 DIAGNOSIS — Z8674 Personal history of sudden cardiac arrest: Secondary | ICD-10-CM | POA: Diagnosis not present

## 2012-11-29 DIAGNOSIS — Z5189 Encounter for other specified aftercare: Secondary | ICD-10-CM | POA: Diagnosis not present

## 2012-11-29 DIAGNOSIS — I4891 Unspecified atrial fibrillation: Secondary | ICD-10-CM | POA: Diagnosis not present

## 2012-12-01 ENCOUNTER — Encounter (HOSPITAL_COMMUNITY)
Admission: RE | Admit: 2012-12-01 | Discharge: 2012-12-01 | Disposition: A | Discharge status: Home / Self Care | Payer: Medicare Other | Source: Ambulatory Visit | Attending: Cardiology | Admitting: Cardiology

## 2012-12-01 DIAGNOSIS — Z5189 Encounter for other specified aftercare: Secondary | ICD-10-CM | POA: Diagnosis not present

## 2012-12-01 DIAGNOSIS — Z8674 Personal history of sudden cardiac arrest: Secondary | ICD-10-CM | POA: Diagnosis not present

## 2012-12-01 DIAGNOSIS — I2589 Other forms of chronic ischemic heart disease: Secondary | ICD-10-CM | POA: Diagnosis not present

## 2012-12-01 DIAGNOSIS — I4891 Unspecified atrial fibrillation: Secondary | ICD-10-CM | POA: Diagnosis not present

## 2012-12-01 DIAGNOSIS — I509 Heart failure, unspecified: Secondary | ICD-10-CM | POA: Diagnosis not present

## 2012-12-01 DIAGNOSIS — I5022 Chronic systolic (congestive) heart failure: Secondary | ICD-10-CM | POA: Diagnosis not present

## 2012-12-04 ENCOUNTER — Encounter (HOSPITAL_COMMUNITY)
Admission: RE | Admit: 2012-12-04 | Discharge: 2012-12-04 | Disposition: A | Discharge status: Home / Self Care | Payer: Medicare Other | Source: Ambulatory Visit | Attending: Cardiology | Admitting: Cardiology

## 2012-12-04 DIAGNOSIS — Z8674 Personal history of sudden cardiac arrest: Secondary | ICD-10-CM | POA: Diagnosis not present

## 2012-12-04 DIAGNOSIS — I2589 Other forms of chronic ischemic heart disease: Secondary | ICD-10-CM | POA: Diagnosis not present

## 2012-12-04 DIAGNOSIS — Z5189 Encounter for other specified aftercare: Secondary | ICD-10-CM | POA: Diagnosis not present

## 2012-12-04 DIAGNOSIS — I509 Heart failure, unspecified: Secondary | ICD-10-CM | POA: Diagnosis not present

## 2012-12-04 DIAGNOSIS — I4891 Unspecified atrial fibrillation: Secondary | ICD-10-CM | POA: Diagnosis not present

## 2012-12-04 DIAGNOSIS — I5022 Chronic systolic (congestive) heart failure: Secondary | ICD-10-CM | POA: Diagnosis not present

## 2012-12-06 ENCOUNTER — Encounter (HOSPITAL_COMMUNITY)
Admission: RE | Admit: 2012-12-06 | Discharge: 2012-12-06 | Disposition: A | Discharge status: Home / Self Care | Payer: Medicare Other | Source: Ambulatory Visit | Attending: Cardiology | Admitting: Cardiology

## 2012-12-06 DIAGNOSIS — I5022 Chronic systolic (congestive) heart failure: Secondary | ICD-10-CM | POA: Diagnosis not present

## 2012-12-06 DIAGNOSIS — I4891 Unspecified atrial fibrillation: Secondary | ICD-10-CM | POA: Insufficient documentation

## 2012-12-06 DIAGNOSIS — Z8674 Personal history of sudden cardiac arrest: Secondary | ICD-10-CM | POA: Insufficient documentation

## 2012-12-06 DIAGNOSIS — Z5189 Encounter for other specified aftercare: Secondary | ICD-10-CM | POA: Insufficient documentation

## 2012-12-06 DIAGNOSIS — I2589 Other forms of chronic ischemic heart disease: Secondary | ICD-10-CM | POA: Insufficient documentation

## 2012-12-06 DIAGNOSIS — Z954 Presence of other heart-valve replacement: Secondary | ICD-10-CM | POA: Diagnosis not present

## 2012-12-06 DIAGNOSIS — I509 Heart failure, unspecified: Secondary | ICD-10-CM | POA: Diagnosis not present

## 2012-12-06 DIAGNOSIS — I2510 Atherosclerotic heart disease of native coronary artery without angina pectoris: Secondary | ICD-10-CM | POA: Diagnosis not present

## 2012-12-06 NOTE — Progress Notes (Signed)
Pt completed 36 exercise classes in cardiac rehab phase II.  Repeat PHQ2 score 0.  Pt plans to continue home exercise with equipment she has at home.  Medication list reconciled.

## 2012-12-08 ENCOUNTER — Encounter (HOSPITAL_COMMUNITY): Payer: Medicare Other

## 2012-12-08 DIAGNOSIS — Z950 Presence of cardiac pacemaker: Secondary | ICD-10-CM | POA: Diagnosis not present

## 2012-12-08 DIAGNOSIS — Z1331 Encounter for screening for depression: Secondary | ICD-10-CM | POA: Diagnosis not present

## 2012-12-08 DIAGNOSIS — E119 Type 2 diabetes mellitus without complications: Secondary | ICD-10-CM | POA: Diagnosis not present

## 2012-12-08 DIAGNOSIS — Z951 Presence of aortocoronary bypass graft: Secondary | ICD-10-CM | POA: Diagnosis not present

## 2012-12-08 DIAGNOSIS — I4891 Unspecified atrial fibrillation: Secondary | ICD-10-CM | POA: Diagnosis not present

## 2012-12-08 DIAGNOSIS — Z6827 Body mass index (BMI) 27.0-27.9, adult: Secondary | ICD-10-CM | POA: Diagnosis not present

## 2012-12-08 DIAGNOSIS — N200 Calculus of kidney: Secondary | ICD-10-CM | POA: Diagnosis not present

## 2012-12-11 ENCOUNTER — Ambulatory Visit (INDEPENDENT_AMBULATORY_CARE_PROVIDER_SITE_OTHER): Payer: Medicare Other | Admitting: *Deleted

## 2012-12-11 DIAGNOSIS — I2589 Other forms of chronic ischemic heart disease: Secondary | ICD-10-CM

## 2012-12-11 DIAGNOSIS — I4891 Unspecified atrial fibrillation: Secondary | ICD-10-CM

## 2012-12-11 DIAGNOSIS — Z95810 Presence of automatic (implantable) cardiac defibrillator: Secondary | ICD-10-CM | POA: Diagnosis not present

## 2012-12-11 DIAGNOSIS — I255 Ischemic cardiomyopathy: Secondary | ICD-10-CM

## 2012-12-12 LAB — REMOTE ICD DEVICE
AL AMPLITUDE: 2.6 mv
ATRIAL PACING ICD: 18 pct
BAMS-0003: 70 {beats}/min
DEVICE MODEL ICD: 7080516
HV IMPEDENCE: 52 Ohm
RV LEAD AMPLITUDE: 11.5 mv
TZAT-0001SLOWVT: 1
TZAT-0013SLOWVT: 2
TZAT-0020SLOWVT: 1 ms
TZST-0001SLOWVT: 2
TZST-0001SLOWVT: 4
TZST-0003SLOWVT: 25 J
TZST-0003SLOWVT: 36 J

## 2012-12-14 ENCOUNTER — Encounter: Payer: Self-pay | Admitting: Internal Medicine

## 2012-12-14 ENCOUNTER — Ambulatory Visit (INDEPENDENT_AMBULATORY_CARE_PROVIDER_SITE_OTHER): Payer: Medicare Other | Admitting: Internal Medicine

## 2012-12-14 VITALS — BP 122/64 | HR 70 | Temp 98.0°F | Resp 10 | Ht 63.5 in | Wt 157.8 lb

## 2012-12-14 DIAGNOSIS — E1129 Type 2 diabetes mellitus with other diabetic kidney complication: Secondary | ICD-10-CM | POA: Diagnosis not present

## 2012-12-14 NOTE — Patient Instructions (Signed)
Please check sugars once a day, rotating check times, and bring the sugar log at next visit. Schedule a new appointment in 2 months, as discussed.   PATIENT INSTRUCTIONS FOR TYPE 2 DIABETES:  DIET AND EXERCISE Diet and exercise is an important part of diabetic treatment.  We recommended aerobic exercise in the form of brisk walking (working between 40-60% of maximal aerobic capacity, similar to brisk walking) for 150 minutes per week (such as 30 minutes five days per week) along with 3 times per week performing 'resistance' training (using various gauge rubber tubes with handles) 5-10 exercises involving the major muscle groups (upper body, lower body and core) performing 10-15 repetitions (or near fatigue) each exercise. Start at half the above goal but build slowly to reach the above goals. If limited by weight, joint pain, or disability, we recommend daily walking in a swimming pool with water up to waist to reduce pressure from joints while allow for adequate exercise.    BLOOD GLUCOSES Monitoring your blood glucoses is important for continued management of your diabetes. Please check your blood glucoses 1-4 times a day: fasting, before meals and at bedtime (you can rotate these measurements - e.g. one day check before the 3 meals, the next day check before 2 of the meals and before bedtime, etc.   HYPOGLYCEMIA (low blood sugar) Hypoglycemia is usually a reaction to not eating, exercising, or taking too much insulin/ other diabetes drugs.  Symptoms include tremors, sweating, hunger, confusion, headache, etc. Treat IMMEDIATELY with 15 grams of Carbs:   4 glucose tablets    cup regular juice/soda   2 tablespoons raisins   4 teaspoons sugar   1 tablespoon honey Recheck blood glucose in 15 mins and repeat above if still symptomatic/blood glucose <100. Please contact our office at [phone] if you have questions about how to next handle your insulin.  RECOMMENDATIONS TO REDUCE YOUR RISK OF  DIABETIC COMPLICATIONS: * Take your prescribed MEDICATION(S). * Follow a DIABETIC diet: Complex carbs, fiber rich foods, heart healthy fish twice weekly, (monounsaturated and polyunsaturated) fats * AVOID saturated/trans fats, high fat foods, >2,300 mg salt per day. * EXERCISE at least 5 times a week for 30 minutes or preferably daily.  * DO NOT SMOKE OR DRINK more than 1 drink a day. * Check your FEET every day. Do not wear tightfitting shoes. Contact us if you develop an ulcer * See your EYE doctor once a year or more if needed * Get a FLU shot once a year * Get a PNEUMONIA vaccine once before and once after age 27 years  GOALS:  * Your Hemoglobin A1c of <7%  * fasting sugars need to be <130 * after meals sugars need to be <180 (2h after you start eating) * Your Systolic BP should be [id number] or lower  * Your Diastolic BP should be 80 or lower  * Your HDL (Good Cholesterol) should be 40 or higher  * Your LDL (Bad Cholesterol) should be 100 or lower  * Your Triglycerides should be 150 or lower  * Your Urine microalbumin (kidney function) should be <30 * Your Body Mass Index should be 25 or lower   We will be glad to help you achieve these goals. Our telephone number is: [phone].

## 2012-12-14 NOTE — Progress Notes (Signed)
Patient ID: [name], female   DOB: 1940-10-06, 72 y.o.   MRN: [id number]  HPI: [name] is a 72 y.o.-year-old female, self-referred, for management of DM2, non-insulin-dependent, controlled, with complications (CAD, ICM, CHF, CKD). She has a new PCP - Dr Velna Hatchet ([facility]).  Patient has been diagnosed with diabetes in ~2000; she has not been on insulin before. Last hemoglobin A1c was: Lab Results  Component Value Date   HGBA1C 6.0 10/18/2012    Pt is not on medicines for DM now.  She was on Metformin, stopped 10/31/2012, stopped b/c Cr 1.4 >> feels better, has increased appetite. She was on Actos before, stopped 06/2009. Mentions this was stopped at d/c from the hospital - complicated stay, had a prolonged Vfib arrest then. Lost 30 lbs in 1 mo hospitalization.   Pt checks her sugars 3-6x a week, and they are: - am: 150 this am - usually not checking - 8 am: b'fast - 10:15 am (before rehab): 140-160, occasional 120 Finished rehab last week.  No lows. Lowest sugar was 118; ? if she has hypoglycemia awareness. Highest sugar was in the 160s.  - Has CKD, last BUN/creatinine:  Lab Results  Component Value Date   BUN 20 10/18/2012   CREATININE 1.4* 10/18/2012  On Losartan. - last set of lipids: Lab Results  Component Value Date   CHOL 200 04/08/2011   HDL 62.70 04/08/2011   LDLCALC 115* 04/08/2011   LDLDIRECT 132.3 10/17/2008   TRIG 110.0 04/08/2011   CHOLHDL 3 04/08/2011  - last eye exam was last year. No DR.  - no numbness and tingling in her feet.  Pt has FH of DM in 2 uncles, one with DM1 and DM2, MGM.   ROS: Constitutional: no weight gain/loss recently, no fatigue, no subjective hyperthermia/hypothermia Eyes: no blurry vision, no xerophthalmia ENT: no sore throat, no nodules palpated in throat, no dysphagia/odynophagia, no hoarseness Cardiovascular: no CP/SOB/palpitations/leg swelling Respiratory: no cough/SOB Gastrointestinal: no N/V/D/C, + heartburn Musculoskeletal: no  muscle/joint aches Skin: no rashes Neurological: no tremors/numbness/tingling/dizziness Psychiatric: no depression/anxiety  Past Medical History  Diagnosis Date  . Anemia   . Atrial fibrillation     A.fib/flutter: s/p MAZE 2008, DCCV 2011, on coumadin; previously on flecainide, but dc'd due to worsening EF. Tikosyn discontinued 06/2012 after cardiac arrest.  . Diverticulitis   . CVA (cerebral vascular accident)     2008 - felt due to oscillating calcium on aortic valve  . Carcinoma in situ of vulva   . Fibroid   . Ovarian cyst, left 2008-2009  . Osteoporosis   . Diabetes mellitus   . Hypertension   . GERD (gastroesophageal reflux disease)   . Chronic systolic CHF (congestive heart failure)     EF 35%  . Ischemic cardiomyopathy     EF 35%  . Coronary artery disease     Occluded LM 2/2 previous aortic root surgery; s/p 2v CABG 2010 LIMA to LAD, SVG to OM; Cath 03/11/12 patent SVG to OM, atretic LIMA to LAD, patent RCA, occluded native LM, EF 35%   . Arthritis     OSTEO  . Fracture of lumbar spine   . 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  . Superficial venous thrombosis of upper extremity     06/2012 - LUE  . Severe aortic stenosis     s/p homograft aortic root  replacement 2008  . Mitral regurgitation     s/p mitral valve repair 2008  . Thoracic aortic aneurysm     s/p resection and grafting 2008  H/o nephrolithiasis.   Past Surgical History  Procedure Laterality Date  . Foot surgery      removed neuroma  . Aortic root replacement  2008    homograft  . Mv repair  2008    due to severe MR  . Resection and grafting of ascending thoracic aortic    . Aneurysm and left sided maze  2008  . Coronary artery bypass graft  4/10    LIMA to LAD, SVG to OM  . Wide excision of vulva      CA INSITU   History   Social  History  . Marital Status: Married    Spouse Name: N/A    Number of Children: 0   Occupational History  . Retired Radiation protection practitioner    Social History Main Topics  . Smoking status: Former Smoker    Quit date: 03/09/1983  . Smokeless tobacco: Never Used  . Alcohol Use: No  . Drug Use: No  . Sexual Activity: Not Currently    Birth Control/ Protection: Post-menopausal   Social History Narrative   Her mother died at age 45 from a stroke.  Her father died at age 7, had chronic obstructive pulmonary disease and colon disease.   Regular exercise: was in cardiac rehab   Caffeine use: none   Current Outpatient Prescriptions on File Prior to Visit  Medication Sig Dispense Refill  . amiodarone (PACERONE) 200 MG tablet 200 mg daily.       . carvedilol (COREG) 12.5 MG tablet Take 1 tablet (12.5 mg total) by mouth 2 (two) times daily.  90 tablet  4  . Cyanocobalamin (VITAMIN B-12) 2500 MCG SUBL Place 1 tablet under the tongue 3 (three) times a week. Dissolves 1 tablet under the tongue on Monday, Wednesday, and Friday      . furosemide (LASIX) 20 MG tablet Take 20 mg by mouth daily.       Marland Kitchen losartan (COZAAR) 50 MG tablet Take 1 tablet (50 mg total) by mouth daily.  90 tablet  4  . omeprazole (PRILOSEC OTC) 20 MG tablet Take 20 mg by mouth daily before breakfast.       . warfarin (COUMADIN) 3 MG tablet Take as directed by coumadin clinic  30 tablet  3   No current facility-administered medications on file prior to visit.   Allergies  Allergen Reactions  . Metformin And Related     renal   Family History  Problem Relation Age of Onset  . Hypertension Mother   . Breast cancer Maternal Aunt     age 76's  . Cancer Paternal Grandmother     COLON   PE: BP 122/64  Pulse 70  Temp(Src) 98 F (36.7 C) (Oral)  Resp 10  Ht 5' 3.5" (1.613 m)  Wt 157 lb 12.8 oz (71.578 kg)  BMI 27.51 kg/m2  SpO2 97% Wt Readings from Last 3 Encounters:  12/14/12 157 lb 12.8 oz (71.578 kg)  11/28/12 155 lb  (70.308 kg)  10/18/12 151 lb 0.6 oz (68.511 kg)   Constitutional: slightly overweight, in NAD Eyes: PERRLA, EOMI, no exophthalmos ENT: moist mucous membranes, no thyromegaly, no cervical lymphadenopathy Cardiovascular: irregularly irregular, 2.6 SEM, no RG Respiratory: CTA B Gastrointestinal: abdomen soft, NT, ND, BS+ Musculoskeletal: no deformities, strength intact in all 4 Skin: moist, warm, no rashes  Neurological: no tremor with outstretched hands, DTR normal in all 4  ASSESSMENT: 1. DM2, non-insulin-dependent, controlled, with complications - CAD, ICM, s/p CABG x 2 times - Dr. Stanford Breed - CHF, EF 35% her last catheterization in 03/2012; has ICD placed (also has atrial fibrillation - s/p MAZE procedure 2008, history of V. Fib arrest 05/2012, started on amiodarone then; also on Coumadin) - CKD - last Cr 1.4 in 10/2012 Lab Results  Component Value Date   CREATININE 1.4* 10/18/2012   CREATININE 1.2 08/31/2012   CREATININE 1.0 06/22/2012   CREATININE 0.84 06/12/2012   CREATININE 0.99 06/09/2012   CREATININE 0.97 06/08/2012   CREATININE 0.91 06/07/2012   CREATININE 0.97 06/06/2012   CREATININE 0.95 06/05/2012   CREATININE 0.97 06/04/2012   PLAN:  1. Patient with long-standing, well-controlled diabetes, previously on oral antidiabetic regimen with Actos, max dose Metformin, now taken off Actos (06/2012) and Metformin (on 10/2012) 2/2 Cr 1.4, as a HbA1c returned at 6%. Patient is worried whether her diabetes would not worsen off metformin and is wondering whether she should substitute another medicine for it. She is not checking blood sugars consistently, and not checking later in the day. She does not bring her meter or log. - We discussed about options for diabetic management, and I suggested to start checking her sugars once a day, rotating check times, so that we can evaluate her sugar control throughout the day. I did explain that she might not require medication for now, and she recently lost 30  pounds while being hospitalized, however I cannot tell for sure unless I have more data points. - given sugar log and advised how to fill it and to bring it at next appt  - given foot care handout and explained the principles  - given instructions for hypoglycemia management "15-15 rule"  - advised for yearly eye exams >> she will schedule a new exam this coming month. - refused a flu vaccine at this point; has had pneumonia vaccine 4 years ago - Return to clinic in 2 months with sugar log - per her preference, she will schedule the new appointment after her appointment with Dr. Maryclare Bean at the beginning of 02/2013, when she would get the new hemoglobin A1c checked

## 2012-12-18 ENCOUNTER — Ambulatory Visit (INDEPENDENT_AMBULATORY_CARE_PROVIDER_SITE_OTHER): Payer: Medicare Other | Admitting: Pharmacist

## 2012-12-18 DIAGNOSIS — I359 Nonrheumatic aortic valve disorder, unspecified: Secondary | ICD-10-CM | POA: Diagnosis not present

## 2012-12-18 DIAGNOSIS — Z1231 Encounter for screening mammogram for malignant neoplasm of breast: Secondary | ICD-10-CM | POA: Diagnosis not present

## 2012-12-18 DIAGNOSIS — Z7901 Long term (current) use of anticoagulants: Secondary | ICD-10-CM | POA: Diagnosis not present

## 2012-12-18 DIAGNOSIS — I059 Rheumatic mitral valve disease, unspecified: Secondary | ICD-10-CM

## 2012-12-18 DIAGNOSIS — I4891 Unspecified atrial fibrillation: Secondary | ICD-10-CM

## 2012-12-18 LAB — POCT INR: INR: 3.1

## 2012-12-27 ENCOUNTER — Encounter: Payer: Self-pay | Admitting: *Deleted

## 2013-01-03 DIAGNOSIS — N200 Calculus of kidney: Secondary | ICD-10-CM | POA: Diagnosis not present

## 2013-01-06 DIAGNOSIS — Z23 Encounter for immunization: Secondary | ICD-10-CM | POA: Diagnosis not present

## 2013-01-08 ENCOUNTER — Ambulatory Visit (INDEPENDENT_AMBULATORY_CARE_PROVIDER_SITE_OTHER): Payer: Medicare Other | Admitting: *Deleted

## 2013-01-08 DIAGNOSIS — I4891 Unspecified atrial fibrillation: Secondary | ICD-10-CM | POA: Diagnosis not present

## 2013-01-08 DIAGNOSIS — I059 Rheumatic mitral valve disease, unspecified: Secondary | ICD-10-CM

## 2013-01-08 DIAGNOSIS — Z7901 Long term (current) use of anticoagulants: Secondary | ICD-10-CM

## 2013-01-08 DIAGNOSIS — I359 Nonrheumatic aortic valve disorder, unspecified: Secondary | ICD-10-CM | POA: Diagnosis not present

## 2013-01-08 LAB — POCT INR: INR: 3.7

## 2013-01-11 ENCOUNTER — Other Ambulatory Visit: Payer: Self-pay

## 2013-01-12 ENCOUNTER — Encounter: Payer: Self-pay | Admitting: Internal Medicine

## 2013-01-22 ENCOUNTER — Ambulatory Visit (INDEPENDENT_AMBULATORY_CARE_PROVIDER_SITE_OTHER): Payer: Medicare Other | Admitting: Pharmacist

## 2013-01-22 DIAGNOSIS — I059 Rheumatic mitral valve disease, unspecified: Secondary | ICD-10-CM | POA: Diagnosis not present

## 2013-01-22 DIAGNOSIS — Z7901 Long term (current) use of anticoagulants: Secondary | ICD-10-CM

## 2013-01-22 DIAGNOSIS — I359 Nonrheumatic aortic valve disorder, unspecified: Secondary | ICD-10-CM

## 2013-01-22 DIAGNOSIS — I4891 Unspecified atrial fibrillation: Secondary | ICD-10-CM

## 2013-02-05 ENCOUNTER — Ambulatory Visit (INDEPENDENT_AMBULATORY_CARE_PROVIDER_SITE_OTHER): Payer: Medicare Other | Admitting: Pharmacist

## 2013-02-05 DIAGNOSIS — I359 Nonrheumatic aortic valve disorder, unspecified: Secondary | ICD-10-CM

## 2013-02-05 DIAGNOSIS — Z7901 Long term (current) use of anticoagulants: Secondary | ICD-10-CM | POA: Diagnosis not present

## 2013-02-05 DIAGNOSIS — I4891 Unspecified atrial fibrillation: Secondary | ICD-10-CM | POA: Diagnosis not present

## 2013-02-05 DIAGNOSIS — I059 Rheumatic mitral valve disease, unspecified: Secondary | ICD-10-CM | POA: Diagnosis not present

## 2013-02-07 DIAGNOSIS — E119 Type 2 diabetes mellitus without complications: Secondary | ICD-10-CM | POA: Diagnosis not present

## 2013-02-07 DIAGNOSIS — Z6827 Body mass index (BMI) 27.0-27.9, adult: Secondary | ICD-10-CM | POA: Diagnosis not present

## 2013-02-08 ENCOUNTER — Encounter: Payer: Self-pay | Admitting: Gastroenterology

## 2013-02-09 ENCOUNTER — Other Ambulatory Visit: Payer: Self-pay | Admitting: Cardiology

## 2013-02-12 ENCOUNTER — Ambulatory Visit: Payer: Medicare Other | Admitting: Internal Medicine

## 2013-02-12 ENCOUNTER — Other Ambulatory Visit: Payer: Self-pay | Admitting: Internal Medicine

## 2013-02-15 ENCOUNTER — Other Ambulatory Visit: Payer: Self-pay | Admitting: Cardiology

## 2013-02-19 ENCOUNTER — Other Ambulatory Visit: Payer: Self-pay | Admitting: *Deleted

## 2013-02-19 ENCOUNTER — Telehealth: Payer: Self-pay | Admitting: Cardiology

## 2013-02-19 MED ORDER — AMIODARONE HCL 200 MG PO TABS: 200.0000 mg | ORAL_TABLET | Freq: Every day | ORAL | Status: DC

## 2013-02-19 MED ORDER — CARVEDILOL 12.5 MG PO TABS: 12.5000 mg | ORAL_TABLET | Freq: Two times a day (BID) | ORAL | Status: DC

## 2013-02-19 NOTE — Telephone Encounter (Signed)
Spoke with pt, follow up appt given. Refills sent to pharm.

## 2013-02-19 NOTE — Telephone Encounter (Signed)
New Message  Pt called, has questions about mediations and "other stuff" No further Details// Please call back to discuss.

## 2013-02-23 ENCOUNTER — Telehealth: Payer: Self-pay | Admitting: Cardiology

## 2013-02-23 NOTE — Telephone Encounter (Signed)
Spoke with pt, since November she has noticed a gradual increase in SOB. She gets SOB when making the bed, taking a shower, walking up stairs or walking out to the car. She recovers quickly. Her heart rate does not change. She denies edema or trouble lying flat. Her weight is up but she has been eating better and does not think it is fluid. She is taking furosemide 20 mg daily. She is not sure if this is anything to worry about. Will forward for dr Stanford Breed review

## 2013-02-23 NOTE — Telephone Encounter (Signed)
New problem    Pt called and asked for you she is having SOB.  Please give her a call back.

## 2013-02-24 NOTE — Telephone Encounter (Signed)
If symptoms improving continue to monitor and follow [name]

## 2013-02-26 ENCOUNTER — Ambulatory Visit (INDEPENDENT_AMBULATORY_CARE_PROVIDER_SITE_OTHER): Payer: Medicare Other | Admitting: Pharmacist

## 2013-02-26 DIAGNOSIS — Z7901 Long term (current) use of anticoagulants: Secondary | ICD-10-CM

## 2013-02-26 DIAGNOSIS — I359 Nonrheumatic aortic valve disorder, unspecified: Secondary | ICD-10-CM | POA: Diagnosis not present

## 2013-02-26 DIAGNOSIS — I059 Rheumatic mitral valve disease, unspecified: Secondary | ICD-10-CM | POA: Diagnosis not present

## 2013-02-26 DIAGNOSIS — I4891 Unspecified atrial fibrillation: Secondary | ICD-10-CM | POA: Diagnosis not present

## 2013-02-26 NOTE — Telephone Encounter (Signed)
Spoke with pt, Aware of dr crenshaw's recommendations.  °

## 2013-03-15 ENCOUNTER — Ambulatory Visit (INDEPENDENT_AMBULATORY_CARE_PROVIDER_SITE_OTHER): Payer: Medicare Other | Admitting: *Deleted

## 2013-03-15 ENCOUNTER — Encounter: Payer: Self-pay | Admitting: Internal Medicine

## 2013-03-15 DIAGNOSIS — I2589 Other forms of chronic ischemic heart disease: Secondary | ICD-10-CM | POA: Diagnosis not present

## 2013-03-15 DIAGNOSIS — I255 Ischemic cardiomyopathy: Secondary | ICD-10-CM

## 2013-03-15 DIAGNOSIS — I4891 Unspecified atrial fibrillation: Secondary | ICD-10-CM | POA: Diagnosis not present

## 2013-03-15 LAB — MDC_IDC_ENUM_SESS_TYPE_REMOTE
Battery Remaining Longevity: 89 mo
Battery Remaining Percentage: 90 %
Brady Statistic AP VP Percent: 1 %
Brady Statistic AP VS Percent: 20 %
Brady Statistic AS VS Percent: 79 %
Brady Statistic RV Percent Paced: 1 %
HIGH POWER IMPEDANCE MEASURED VALUE: 56 Ohm
HighPow Impedance: 56 Ohm
Implantable Pulse Generator Model: 2357
Implantable Pulse Generator Serial Number: 7080516
Lead Channel Pacing Threshold Amplitude: 0.5 V
Lead Channel Sensing Intrinsic Amplitude: 3.6 mV
Lead Channel Setting Pacing Amplitude: 2 V
Lead Channel Setting Sensing Sensitivity: 0.5 mV
MDC IDC MSMT BATTERY VOLTAGE: 3.16 V
MDC IDC MSMT LEADCHNL RA IMPEDANCE VALUE: 450 Ohm
MDC IDC MSMT LEADCHNL RV IMPEDANCE VALUE: 580 Ohm
MDC IDC MSMT LEADCHNL RV PACING THRESHOLD PULSEWIDTH: 0.5 ms
MDC IDC MSMT LEADCHNL RV SENSING INTR AMPL: 12 mV
MDC IDC SESS DTM: 20150108082637
MDC IDC SET LEADCHNL RV PACING AMPLITUDE: 2.5 V
MDC IDC SET LEADCHNL RV PACING PULSEWIDTH: 0.5 ms
MDC IDC SET ZONE DETECTION INTERVAL: 250 ms
MDC IDC STAT BRADY AS VP PERCENT: 1 %
MDC IDC STAT BRADY RA PERCENT PACED: 19 %
Zone Setting Detection Interval: 300 ms

## 2013-03-16 DIAGNOSIS — R5381 Other malaise: Secondary | ICD-10-CM | POA: Diagnosis not present

## 2013-03-16 DIAGNOSIS — R5383 Other fatigue: Secondary | ICD-10-CM | POA: Diagnosis not present

## 2013-03-16 DIAGNOSIS — E559 Vitamin D deficiency, unspecified: Secondary | ICD-10-CM | POA: Diagnosis not present

## 2013-03-16 DIAGNOSIS — M810 Age-related osteoporosis without current pathological fracture: Secondary | ICD-10-CM | POA: Diagnosis not present

## 2013-03-21 ENCOUNTER — Telehealth: Payer: Self-pay | Admitting: Internal Medicine

## 2013-03-21 NOTE — Telephone Encounter (Signed)
Pt has humidifier that uses ultrasonic frequency. Gave her [employer] 800# for direct clarification.

## 2013-03-21 NOTE — Telephone Encounter (Signed)
New problem    Pt has questions about her humidiaphia and her device.   Please give her a call back.

## 2013-03-22 ENCOUNTER — Encounter: Payer: Self-pay | Admitting: Gastroenterology

## 2013-03-22 ENCOUNTER — Ambulatory Visit (INDEPENDENT_AMBULATORY_CARE_PROVIDER_SITE_OTHER): Payer: Medicare Other | Admitting: Gastroenterology

## 2013-03-22 VITALS — BP 130/58 | HR 80 | Ht 63.0 in | Wt 163.5 lb

## 2013-03-22 DIAGNOSIS — E1169 Type 2 diabetes mellitus with other specified complication: Secondary | ICD-10-CM | POA: Diagnosis not present

## 2013-03-22 DIAGNOSIS — Z79899 Other long term (current) drug therapy: Secondary | ICD-10-CM | POA: Diagnosis not present

## 2013-03-22 DIAGNOSIS — D649 Anemia, unspecified: Secondary | ICD-10-CM | POA: Diagnosis not present

## 2013-03-22 DIAGNOSIS — D126 Benign neoplasm of colon, unspecified: Secondary | ICD-10-CM

## 2013-03-22 DIAGNOSIS — I4891 Unspecified atrial fibrillation: Secondary | ICD-10-CM | POA: Diagnosis not present

## 2013-03-22 NOTE — Patient Instructions (Signed)
Follow up as needed

## 2013-03-22 NOTE — Assessment & Plan Note (Signed)
There is  a note that she has a history of colon polyps I do not see any pathology findings.  Patient is clearly at high-risk for any procedure.  Previous colonoscopy in 2009 demonstrated diverticulosis only.  In the absence of specific GI complaints I would not pursue any elective GI procedures

## 2013-03-22 NOTE — Progress Notes (Signed)
_                                                                                                                History of Present Illness: 73 year old white female with multiple medical problems including atrial fibrillation, CVA, ischemic cardiomyopathy, coronary artery disease, history of V. fib and V. tach/arrest, on Coumadin, referred for consideration of colonoscopy.  Last colonoscopy 2009 demonstrated diverticulosis.  She has no GI complaints including abdominal pain, change in bowel habits, melena or hematochezia.    Past Medical History  Diagnosis Date  . Anemia   . Atrial fibrillation     A.fib/flutter: s/p MAZE 2008, DCCV 2011, on coumadin; previously on flecainide, but dc'd due to worsening EF. Tikosyn discontinued 06/2012 after cardiac arrest.  . Diverticulitis   . CVA (cerebral vascular accident)     2008 - felt due to oscillating calcium on aortic valve  . Carcinoma in situ of vulva   . Fibroid   . Ovarian cyst, left 2008-2009  . Osteoporosis   . Diabetes mellitus   . Hypertension   . GERD (gastroesophageal reflux disease)   . Chronic systolic CHF (congestive heart failure)     EF 35%  . Ischemic cardiomyopathy     EF 35%  . Coronary artery disease     Occluded LM 2/2 previous aortic root surgery; s/p 2v CABG 2010 LIMA to LAD, SVG to OM; Cath 03/11/12 patent SVG to OM, atretic LIMA to LAD, patent RCA, occluded native LM, EF 35%   . Arthritis     OSTEO  . Fracture of lumbar spine   . 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  . Superficial venous thrombosis of upper extremity     06/2012 - LUE  . Severe aortic stenosis     s/p homograft aortic root replacement 2008  . Mitral regurgitation     s/p mitral valve repair 2008  . Thoracic aortic aneurysm     s/p resection and grafting 2008  . Stroke    . Kidney stones    Past Surgical History  Procedure Laterality Date  . Foot surgery Left     removed neuroma  . Aortic root replacement  2008    homograft  . Mitral valve replacement  2008    due to severe MR  . Resection and grafting of ascending thoracic aortic    . Aneurysm and left sided maze  2008  . Coronary artery bypass graft  4/10    LIMA to LAD, SVG to OM  . Wide excision of vulva      CA INSITU  . Cardiac defibrillator placement  06/2012   family history includes Breast cancer in her maternal aunt; Colon cancer in her paternal grandmother; Diabetes in her maternal uncle and paternal uncle; Hypertension in her mother. Current Outpatient Prescriptions  Medication Sig Dispense Refill  . amiodarone (PACERONE) 200  MG tablet Take 1 tablet (200 mg total) by mouth daily.  90 tablet  3  . carvedilol (COREG) 12.5 MG tablet Take 1 tablet (12.5 mg total) by mouth 2 (two) times daily with a meal.  180 tablet  3  . Cyanocobalamin (VITAMIN B-12) 2500 MCG SUBL Place 1 tablet under the tongue 3 (three) times a week. Dissolves 1 tablet under the tongue on Monday, Wednesday, and Friday      . ergocalciferol (VITAMIN D2) 50000 UNITS capsule Take 50,000 Units by mouth once a week.      . furosemide (LASIX) 20 MG tablet Take 20 mg by mouth daily.       Marland Kitchen losartan (COZAAR) 50 MG tablet Take 1 tablet (50 mg total) by mouth daily.  90 tablet  4  . omeprazole (PRILOSEC OTC) 20 MG tablet Take 20 mg by mouth daily before breakfast.       . warfarin (COUMADIN) 3 MG tablet TAKE AS DIRECTED BY COUMADIN CLINIC  30 tablet  3   No current facility-administered medications for this visit.   Allergies as of 03/22/2013 - Review Complete 03/22/2013  Allergen Reaction Noted  . Metformin and related  10/19/2012    reports that she quit smoking about 30 years ago. Her smoking use included Cigarettes. She smoked 0.00 packs per day. She has never used smokeless tobacco. She reports that she does not drink  alcohol or use illicit drugs.     Review of Systems: Pertinent positive and negative review of systems were noted in the above HPI section. All other review of systems were otherwise negative.  Vital signs were reviewed in today's medical record Physical Exam: General: Well developed , well nourished, no acute distress Skin: anicteric Head: Normocephalic and atraumatic Eyes:  sclerae anicteric, EOMI Ears: Normal auditory acuity Mouth: No deformity or lesions Neck: Supple, no masses or thyromegaly Lungs: Clear throughout to auscultation Heart: Regular rate and rhythm; no murmurs, rubs or bruits Abdomen: Soft, non tender and non distended. No masses, hepatosplenomegaly or hernias noted. Normal Bowel sounds Rectal:deferred Musculoskeletal: Symmetrical with no gross deformities  Skin: No lesions on visible extremities Pulses:  Normal pulses noted Extremities: No clubbing, cyanosis, edema or deformities noted Neurological: Alert oriented x 4, grossly nonfocal Cervical Nodes:  No significant cervical adenopathy Inguinal Nodes: No significant inguinal adenopathy Psychological:  Alert and cooperative. Normal mood and affect  See Assessment and Plan under Problem List

## 2013-03-23 DIAGNOSIS — M810 Age-related osteoporosis without current pathological fracture: Secondary | ICD-10-CM | POA: Diagnosis not present

## 2013-03-23 DIAGNOSIS — E559 Vitamin D deficiency, unspecified: Secondary | ICD-10-CM | POA: Diagnosis not present

## 2013-03-26 DIAGNOSIS — Z0000 Encounter for general adult medical examination without abnormal findings: Secondary | ICD-10-CM | POA: Diagnosis not present

## 2013-03-26 DIAGNOSIS — D649 Anemia, unspecified: Secondary | ICD-10-CM | POA: Diagnosis not present

## 2013-03-26 DIAGNOSIS — E119 Type 2 diabetes mellitus without complications: Secondary | ICD-10-CM | POA: Diagnosis not present

## 2013-03-26 DIAGNOSIS — Z95810 Presence of automatic (implantable) cardiac defibrillator: Secondary | ICD-10-CM | POA: Diagnosis not present

## 2013-03-26 DIAGNOSIS — E559 Vitamin D deficiency, unspecified: Secondary | ICD-10-CM | POA: Diagnosis not present

## 2013-03-26 DIAGNOSIS — M810 Age-related osteoporosis without current pathological fracture: Secondary | ICD-10-CM | POA: Diagnosis not present

## 2013-03-26 DIAGNOSIS — N200 Calculus of kidney: Secondary | ICD-10-CM | POA: Diagnosis not present

## 2013-03-26 DIAGNOSIS — I4891 Unspecified atrial fibrillation: Secondary | ICD-10-CM | POA: Diagnosis not present

## 2013-03-27 ENCOUNTER — Encounter: Payer: Self-pay | Admitting: Cardiology

## 2013-03-27 ENCOUNTER — Ambulatory Visit (INDEPENDENT_AMBULATORY_CARE_PROVIDER_SITE_OTHER): Payer: Medicare Other | Admitting: *Deleted

## 2013-03-27 ENCOUNTER — Ambulatory Visit (INDEPENDENT_AMBULATORY_CARE_PROVIDER_SITE_OTHER): Payer: Medicare Other | Admitting: Cardiology

## 2013-03-27 VITALS — BP 130/78 | HR 71 | Ht 63.0 in | Wt 163.0 lb

## 2013-03-27 DIAGNOSIS — I4891 Unspecified atrial fibrillation: Secondary | ICD-10-CM

## 2013-03-27 DIAGNOSIS — I10 Essential (primary) hypertension: Secondary | ICD-10-CM

## 2013-03-27 DIAGNOSIS — Z95810 Presence of automatic (implantable) cardiac defibrillator: Secondary | ICD-10-CM | POA: Diagnosis not present

## 2013-03-27 DIAGNOSIS — I255 Ischemic cardiomyopathy: Secondary | ICD-10-CM

## 2013-03-27 DIAGNOSIS — I059 Rheumatic mitral valve disease, unspecified: Secondary | ICD-10-CM | POA: Diagnosis not present

## 2013-03-27 DIAGNOSIS — Z7901 Long term (current) use of anticoagulants: Secondary | ICD-10-CM

## 2013-03-27 DIAGNOSIS — I359 Nonrheumatic aortic valve disorder, unspecified: Secondary | ICD-10-CM | POA: Diagnosis not present

## 2013-03-27 DIAGNOSIS — I2589 Other forms of chronic ischemic heart disease: Secondary | ICD-10-CM | POA: Diagnosis not present

## 2013-03-27 LAB — POCT INR: INR: 1.9

## 2013-03-27 NOTE — Progress Notes (Signed)
HPI: Pleasant female with history of severe AS, mitral regurgitation, aneurysm of the ascending thoracic aorta (status post aortic valve replacement, mitral valve repair, resection and grafting of the ascending thoracic aortic aneurysm on Jul 07, 2006), history of paroxysmal atrial fibrillation (status post maze procedure on Jul 07, 2006). Followup catheterization performed secondary to elevated troponin and recurrent atrial fibrillation revealed an ejection fraction of 25-30% and occlusion of the reimplanted left main. The patient subsequently underwent redo coronary artery bypass and graft with a LIMA to the LAD and a saphenous vein graft to the OM. She also had an epicardial lead placed if she required biventricular pacing in the future. Echocardiogram in October of 2013 showed an ejection fraction of 35-40%, mild perivalvular aortic insufficiency, mild mitral stenosis and mitral regurgitation status post mitral valve repair and mild biatrial enlargement. Mild to moderate tricuspid regurgitation. Cardiac catheterization repeated in January of 2014 because of reduced LV function. The left main was occluded. The LAD, circumflex and right coronary artery were patent. The LIMA to the LAD was patent but atretic. Saphenous vein graft to the obtuse marginal was patent. This graft filled the obtuse marginal and ramus intermedius and the entire LAD filled retrograde from this graft. Ejection fraction was 35%. Patient referred to Dr. Rayann Heman for consideration of ICD; also placed on tikosyn for atrial fibrillation. Patient then admitted in March of 2014 following a ventricular fibrillation arrest. She required a prolonged resuscitation. Her QT was prolonged and her tikosyn DCed. Her hospital course was complicated by a DVT in her left arm. She ultimately had a [employer]. Jude ICD implanted. Amiodarone initiated. Most recent laboratories in January of 2015 showed a BUN and creatinine of 17 and 1.4. Hemoglobin 8.5. Liver  functions normal. TSH 3.54. Since I last saw her in Sept of 2014, she denies dyspnea, chest pain, palpitations or syncope.    Current Outpatient Prescriptions  Medication Sig Dispense Refill  . amiodarone (PACERONE) 200 MG tablet Take 1 tablet (200 mg total) by mouth daily.  90 tablet  3  . carvedilol (COREG) 12.5 MG tablet Take 1 tablet (12.5 mg total) by mouth 2 (two) times daily with a meal.  180 tablet  3  . Cholecalciferol (VITAMIN D3) 2000 UNITS TABS Take by mouth.      . Cyanocobalamin (VITAMIN B-12) 2500 MCG SUBL Place 1 tablet under the tongue 3 (three) times a week. Dissolves 1 tablet under the tongue on Monday, Wednesday, and Friday      . furosemide (LASIX) 20 MG tablet Take 20 mg by mouth daily.       Marland Kitchen losartan (COZAAR) 50 MG tablet Take 1 tablet (50 mg total) by mouth daily.  90 tablet  4  . omeprazole (PRILOSEC OTC) 20 MG tablet Take 20 mg by mouth daily before breakfast.       . warfarin (COUMADIN) 3 MG tablet TAKE AS DIRECTED BY COUMADIN CLINIC  30 tablet  3   No current facility-administered medications for this visit.     Past Medical History  Diagnosis Date  . Anemia   . Atrial fibrillation     A.fib/flutter: s/p MAZE 2008, DCCV 2011, on coumadin; previously on flecainide, but dc'd due to worsening EF. Tikosyn discontinued 06/2012 after cardiac arrest.  . Diverticulitis   . CVA (cerebral vascular accident)     2008 - felt due to oscillating calcium on aortic valve  . Carcinoma in situ of vulva   . Fibroid   .  Ovarian cyst, left 2008-2009  . Osteoporosis   . Diabetes mellitus   . Hypertension   . GERD (gastroesophageal reflux disease)   . Chronic systolic CHF (congestive heart failure)     EF 35%  . Ischemic cardiomyopathy     EF 35%  . Coronary artery disease     Occluded LM 2/2 previous aortic root surgery; s/p 2v CABG 2010 LIMA to LAD, SVG to OM; Cath 03/11/12 patent SVG to OM, atretic LIMA to LAD, patent RCA, occluded native LM, EF 35%   . Arthritis      OSTEO  . Fracture of lumbar spine   . 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  . Superficial venous thrombosis of upper extremity     06/2012 - LUE  . Severe aortic stenosis     s/p homograft aortic root replacement 2008  . Mitral regurgitation     s/p mitral valve repair 2008  . Thoracic aortic aneurysm     s/p resection and grafting 2008  . Stroke   . Kidney stones     Past Surgical History  Procedure Laterality Date  . Foot surgery Left     removed neuroma  . Aortic root replacement  2008    homograft  . Mitral valve replacement  2008    due to severe MR  . Resection and grafting of ascending thoracic aortic    . Aneurysm and left sided maze  2008  . Coronary artery bypass graft  4/10    LIMA to LAD, SVG to OM  . Wide excision of vulva      CA INSITU  . Cardiac defibrillator placement  06/2012    History   Social History  . Marital Status: Married    Spouse Name: N/A    Number of Children: 0  . Years of Education: N/A   Occupational History  . Retired Radiation protection practitioner    Social History Main Topics  . Smoking status: Former Smoker    Types: Cigarettes    Quit date: 03/09/1983  . Smokeless tobacco: Never Used  . Alcohol Use: No  . Drug Use: No  . Sexual Activity: Not Currently    Birth Control/ Protection: Post-menopausal   Other Topics Concern  . Not on file   Social History Narrative   Her mother died at age 52 from a stroke.  Her father died at age 42, had chronic obstructive pulmonary disease and colon disease.   Regular exercise: was in cardiac rehab   Caffeine use: none    ROS: no fevers or chills, productive cough, hemoptysis, dysphasia, odynophagia, melena, hematochezia, dysuria, hematuria, rash, seizure activity, orthopnea, PND, pedal edema, claudication. Remaining systems are negative.  Physical  Exam: Well-developed well-nourished in no acute distress.  Skin is warm and dry.  HEENT is normal.  Neck is supple.  Chest is clear to auscultation with normal expansion.  Cardiovascular exam is regular rate and rhythm. 2/6 systolic murmur left sternal border. Abdominal exam nontender or distended. No masses palpated. Extremities show no edema. neuro grossly intact  ECG sinus rhythm with occasional PACs. Left axis deviation. Cannot rule out prior septal infarct. Left ventricular hypertrophy.

## 2013-03-27 NOTE — Assessment & Plan Note (Signed)
Patient remains in sinus rhythm. Continue amiodarone. Recent liver functions and TSH normal. Check chest x-ray. Continue Coumadin.

## 2013-03-27 NOTE — Assessment & Plan Note (Signed)
Continue ARB and beta blocker. Repeat echocardiogram.

## 2013-03-27 NOTE — Patient Instructions (Signed)
Your physician wants you to follow-up in: [place] will receive a reminder letter in the mail two months in advance. If you don't receive a letter, please call our office to schedule the follow-up appointment.   A chest x-ray takes a picture of the organs and structures inside the chest, including the heart, lungs, and blood vessels. This test can show several things, including, whether the heart is enlarges; whether fluid is building up in the lungs; and whether pacemaker / defibrillator leads are still in place. AT [place] physician has requested that you have an echocardiogram. Echocardiography is a painless test that uses sound waves to create images of your heart. It provides your doctor with information about the size and shape of your heart and how well your heart's chambers and valves are working. This procedure takes approximately one hour. There are no restrictions for this procedure.

## 2013-03-27 NOTE — Assessment & Plan Note (Signed)
Status post mitral valve repair and aortic valve replacement. Repeat echocardiogram. Continue SBE prophylaxis.

## 2013-03-27 NOTE — Assessment & Plan Note (Signed)
Followed by electrophysiology. 

## 2013-03-27 NOTE — Assessment & Plan Note (Signed)
Blood pressure controlled.continue present medications. 

## 2013-03-29 ENCOUNTER — Telehealth: Payer: Self-pay | Admitting: Internal Medicine

## 2013-03-29 NOTE — Telephone Encounter (Signed)
New message      Returning a Heather's call

## 2013-03-29 NOTE — Telephone Encounter (Signed)
I spoke with the patient. Will enroll in [facility] clinic.

## 2013-03-30 ENCOUNTER — Ambulatory Visit (INDEPENDENT_AMBULATORY_CARE_PROVIDER_SITE_OTHER)
Admission: RE | Admit: 2013-03-30 | Discharge: 2013-03-30 | Disposition: A | Discharge status: Home / Self Care | Payer: Medicare Other | Source: Ambulatory Visit | Attending: Cardiology | Admitting: Cardiology

## 2013-03-30 DIAGNOSIS — I4891 Unspecified atrial fibrillation: Secondary | ICD-10-CM | POA: Diagnosis not present

## 2013-03-30 DIAGNOSIS — Z1212 Encounter for screening for malignant neoplasm of rectum: Secondary | ICD-10-CM | POA: Diagnosis not present

## 2013-03-30 DIAGNOSIS — J9819 Other pulmonary collapse: Secondary | ICD-10-CM | POA: Diagnosis not present

## 2013-03-30 DIAGNOSIS — J811 Chronic pulmonary edema: Secondary | ICD-10-CM | POA: Diagnosis not present

## 2013-04-02 ENCOUNTER — Encounter: Payer: Self-pay | Admitting: *Deleted

## 2013-04-16 ENCOUNTER — Ambulatory Visit (HOSPITAL_COMMUNITY): Payer: Medicare Other | Attending: Cardiology | Admitting: Radiology

## 2013-04-16 ENCOUNTER — Encounter: Payer: Self-pay | Admitting: Cardiology

## 2013-04-16 ENCOUNTER — Ambulatory Visit (INDEPENDENT_AMBULATORY_CARE_PROVIDER_SITE_OTHER): Payer: Medicare Other | Admitting: *Deleted

## 2013-04-16 ENCOUNTER — Ambulatory Visit (INDEPENDENT_AMBULATORY_CARE_PROVIDER_SITE_OTHER): Payer: Medicare Other | Admitting: Pharmacist

## 2013-04-16 DIAGNOSIS — Z954 Presence of other heart-valve replacement: Secondary | ICD-10-CM | POA: Diagnosis not present

## 2013-04-16 DIAGNOSIS — I359 Nonrheumatic aortic valve disorder, unspecified: Secondary | ICD-10-CM

## 2013-04-16 DIAGNOSIS — I059 Rheumatic mitral valve disease, unspecified: Secondary | ICD-10-CM | POA: Diagnosis not present

## 2013-04-16 DIAGNOSIS — I255 Ischemic cardiomyopathy: Secondary | ICD-10-CM

## 2013-04-16 DIAGNOSIS — I079 Rheumatic tricuspid valve disease, unspecified: Secondary | ICD-10-CM | POA: Insufficient documentation

## 2013-04-16 DIAGNOSIS — I4891 Unspecified atrial fibrillation: Secondary | ICD-10-CM | POA: Diagnosis not present

## 2013-04-16 DIAGNOSIS — Z95810 Presence of automatic (implantable) cardiac defibrillator: Secondary | ICD-10-CM | POA: Insufficient documentation

## 2013-04-16 DIAGNOSIS — Z7901 Long term (current) use of anticoagulants: Secondary | ICD-10-CM

## 2013-04-16 DIAGNOSIS — I2589 Other forms of chronic ischemic heart disease: Secondary | ICD-10-CM | POA: Insufficient documentation

## 2013-04-16 DIAGNOSIS — E119 Type 2 diabetes mellitus without complications: Secondary | ICD-10-CM | POA: Diagnosis not present

## 2013-04-16 DIAGNOSIS — N189 Chronic kidney disease, unspecified: Secondary | ICD-10-CM | POA: Diagnosis not present

## 2013-04-16 DIAGNOSIS — I129 Hypertensive chronic kidney disease with stage 1 through stage 4 chronic kidney disease, or unspecified chronic kidney disease: Secondary | ICD-10-CM | POA: Insufficient documentation

## 2013-04-16 DIAGNOSIS — Z8673 Personal history of transient ischemic attack (TIA), and cerebral infarction without residual deficits: Secondary | ICD-10-CM | POA: Insufficient documentation

## 2013-04-16 LAB — POCT INR: INR: 2

## 2013-04-16 NOTE — Progress Notes (Signed)
EPIC Encounter for ICM Monitoring  Patient Name: [name] is a 73 y.o. female Date: 04/16/2013 Primary Care Physican: Velna Hatchet, MD Primary Cardiologist: Stanford Breed Electrophysiologist: Allred Dry Weight: 167 lbs       In the past month, have you:  1. Gained more than 2 pounds in a day or more than 5 pounds in a week? no  2. Had changes in your medications (with verification of current medications)? no  3. Had more shortness of breath than is usual for you? no  4. Limited your activity because of shortness of breath? no  5. Not been able to sleep because of shortness of breath? no  6. Had increased swelling in your feet or ankles? no  7. Had symptoms of dehydration (dizziness, dry mouth, increased thirst, decreased urine output) no  8. Had changes in sodium restriction? no  9. Been compliant with medication? Yes   ICM trend:   Follow-up plan: ICM clinic phone appointment: 05/17/13  (The patient is scheduled for a follow up echo today per Dr. Stanford Breed).  Copy of note sent to patient's primary care physician, primary cardiologist, and device following physician.  Alvis Lemmings, RN, BSN 04/16/2013 9:56 AM

## 2013-04-16 NOTE — Progress Notes (Signed)
Echocardiogram performed.  

## 2013-04-17 ENCOUNTER — Other Ambulatory Visit: Payer: Self-pay | Admitting: *Deleted

## 2013-04-17 MED ORDER — LINAGLIPTIN 5 MG PO TABS: 5.0000 mg | ORAL_TABLET | Freq: Every day | ORAL | Status: DC

## 2013-04-22 ENCOUNTER — Other Ambulatory Visit: Payer: Self-pay | Admitting: Cardiology

## 2013-05-07 ENCOUNTER — Ambulatory Visit (INDEPENDENT_AMBULATORY_CARE_PROVIDER_SITE_OTHER): Payer: Medicare Other

## 2013-05-07 DIAGNOSIS — Z7901 Long term (current) use of anticoagulants: Secondary | ICD-10-CM | POA: Diagnosis not present

## 2013-05-07 DIAGNOSIS — I4891 Unspecified atrial fibrillation: Secondary | ICD-10-CM

## 2013-05-07 DIAGNOSIS — I059 Rheumatic mitral valve disease, unspecified: Secondary | ICD-10-CM | POA: Diagnosis not present

## 2013-05-07 DIAGNOSIS — I359 Nonrheumatic aortic valve disorder, unspecified: Secondary | ICD-10-CM

## 2013-05-07 LAB — POCT INR: INR: 2.7

## 2013-05-17 ENCOUNTER — Ambulatory Visit (INDEPENDENT_AMBULATORY_CARE_PROVIDER_SITE_OTHER): Payer: Medicare Other | Admitting: *Deleted

## 2013-05-17 ENCOUNTER — Other Ambulatory Visit: Payer: Self-pay | Admitting: Internal Medicine

## 2013-05-17 DIAGNOSIS — I2589 Other forms of chronic ischemic heart disease: Secondary | ICD-10-CM

## 2013-05-17 DIAGNOSIS — Z95810 Presence of automatic (implantable) cardiac defibrillator: Secondary | ICD-10-CM | POA: Diagnosis not present

## 2013-05-17 DIAGNOSIS — I255 Ischemic cardiomyopathy: Secondary | ICD-10-CM

## 2013-05-17 NOTE — Progress Notes (Signed)
EPIC Encounter for ICM Monitoring  Patient Name: [name] is a 73 y.o. female Date: 05/17/2013 Primary Care Physican: Velna Hatchet, MD Primary Cardiologist: Stanford Breed Electrophysiologist: Allred Dry Weight: 167 lbs       In the past month, have you:  1. Gained more than 2 pounds in a day or more than 5 pounds in a week? No. (165 lbs today).  2. Had changes in your medications (with verification of current medications)? no  3. Had more shortness of breath than is usual for you? no  4. Limited your activity because of shortness of breath? no  5. Not been able to sleep because of shortness of breath? no  6. Had increased swelling in your feet or ankles? no  7. Had symptoms of dehydration (dizziness, dry mouth, increased thirst, decreased urine output) no  8. Had changes in sodium restriction? no  9. Been compliant with medication? Yes  ** Only complaint today is a couple of fleeting episodes of dizziness at rest. **   ICM trend:   Follow-up plan: ICM clinic phone appointment: 07/19/13 (seeing Dr. Rayann Heman on 06/18/13)  Copy of note sent to patient's primary care physician, primary cardiologist, and device following physician.  Alvis Lemmings, RN, BSN 05/17/2013 10:28 AM

## 2013-06-04 ENCOUNTER — Ambulatory Visit (INDEPENDENT_AMBULATORY_CARE_PROVIDER_SITE_OTHER): Payer: Medicare Other | Admitting: *Deleted

## 2013-06-04 DIAGNOSIS — I059 Rheumatic mitral valve disease, unspecified: Secondary | ICD-10-CM | POA: Diagnosis not present

## 2013-06-04 DIAGNOSIS — I4891 Unspecified atrial fibrillation: Secondary | ICD-10-CM

## 2013-06-04 DIAGNOSIS — I359 Nonrheumatic aortic valve disorder, unspecified: Secondary | ICD-10-CM | POA: Diagnosis not present

## 2013-06-04 DIAGNOSIS — Z7901 Long term (current) use of anticoagulants: Secondary | ICD-10-CM

## 2013-06-04 LAB — POCT INR: INR: 3.1

## 2013-06-18 ENCOUNTER — Encounter: Payer: Self-pay | Admitting: Internal Medicine

## 2013-06-18 ENCOUNTER — Ambulatory Visit (INDEPENDENT_AMBULATORY_CARE_PROVIDER_SITE_OTHER): Payer: Medicare Other | Admitting: Internal Medicine

## 2013-06-18 VITALS — BP 132/60 | HR 71 | Ht 63.5 in | Wt 167.2 lb

## 2013-06-18 DIAGNOSIS — I4891 Unspecified atrial fibrillation: Secondary | ICD-10-CM

## 2013-06-18 DIAGNOSIS — R0602 Shortness of breath: Secondary | ICD-10-CM

## 2013-06-18 DIAGNOSIS — I38 Endocarditis, valve unspecified: Secondary | ICD-10-CM

## 2013-06-18 DIAGNOSIS — I059 Rheumatic mitral valve disease, unspecified: Secondary | ICD-10-CM | POA: Diagnosis not present

## 2013-06-18 DIAGNOSIS — I255 Ischemic cardiomyopathy: Secondary | ICD-10-CM

## 2013-06-18 DIAGNOSIS — I509 Heart failure, unspecified: Secondary | ICD-10-CM

## 2013-06-18 DIAGNOSIS — I2589 Other forms of chronic ischemic heart disease: Secondary | ICD-10-CM | POA: Diagnosis not present

## 2013-06-18 DIAGNOSIS — I10 Essential (primary) hypertension: Secondary | ICD-10-CM

## 2013-06-18 LAB — BASIC METABOLIC PANEL
BUN: 20 mg/dL (ref 6–23)
CALCIUM: 8.4 mg/dL (ref 8.4–10.5)
CO2: 29 meq/L (ref 19–32)
Chloride: 104 mEq/L (ref 96–112)
Creatinine, Ser: 1.5 mg/dL — ABNORMAL HIGH (ref 0.4–1.2)
GFR: 37.13 mL/min — AB (ref >60.00)
Glucose, Bld: 127 mg/dL — ABNORMAL HIGH (ref 70–99)
Potassium: 4.9 mEq/L (ref 3.5–5.1)
SODIUM: 141 meq/L (ref 135–145)

## 2013-06-18 LAB — MDC_IDC_ENUM_SESS_TYPE_INCLINIC
Brady Statistic RV Percent Paced: 0.05 %
HIGH POWER IMPEDANCE MEASURED VALUE: 50.625
Implantable Pulse Generator Model: 2357
Implantable Pulse Generator Serial Number: 7080516
Lead Channel Impedance Value: 400 Ohm
Lead Channel Impedance Value: 525 Ohm
Lead Channel Pacing Threshold Amplitude: 0.75 V
Lead Channel Pacing Threshold Pulse Width: 0.5 ms
Lead Channel Sensing Intrinsic Amplitude: 12 mV
Lead Channel Sensing Intrinsic Amplitude: 3.9 mV
Lead Channel Setting Pacing Amplitude: 2 V
Lead Channel Setting Pacing Amplitude: 2.5 V
Lead Channel Setting Pacing Pulse Width: 0.5 ms
MDC IDC MSMT BATTERY REMAINING LONGEVITY: 93.6 mo
MDC IDC MSMT LEADCHNL RV PACING THRESHOLD AMPLITUDE: 0.75 V
MDC IDC MSMT LEADCHNL RV PACING THRESHOLD PULSEWIDTH: 0.5 ms
MDC IDC SESS DTM: 20150413142014
MDC IDC SET LEADCHNL RV SENSING SENSITIVITY: 0.5 mV
MDC IDC SET ZONE DETECTION INTERVAL: 250 ms
MDC IDC SET ZONE DETECTION INTERVAL: 300 ms
MDC IDC STAT BRADY RA PERCENT PACED: 20 %

## 2013-06-18 MED ORDER — AMIODARONE HCL 200 MG PO TABS: 100.0000 mg | ORAL_TABLET | Freq: Every day | ORAL | Status: DC

## 2013-06-18 NOTE — Patient Instructions (Addendum)
Your physician wants you to follow-up in: 12 months with Dr Vallery Ridge will receive a reminder letter in the mail two months in advance. If you don't receive a letter, please call our office to schedule the follow-up appointment.   Remote monitoring is used to monitor your Pacemaker or ICD from home. This monitoring reduces the number of office visits required to check your device to one time per year. It allows [place] to keep an eye on the functioning of your device to ensure it is working properly. You are scheduled for a device check from home on 09/19/13. You may send your transmission at any time that day. If you have a wireless device, the transmission will be sent automatically. After your physician reviews your transmission, you will receive a postcard with your next transmission date.  Your physician has recommended you make the following change in your medication:  1) Decrease Amiodarone to 100mg  daily 2) Increase Furosemide to 20mg  twice daily for 3 days then go back to 20mg  daily

## 2013-06-18 NOTE — Progress Notes (Signed)
PCP: Velna Hatchet, MD Primary Cardiologist:  Dr Olen Pel [name] is a 73 y.o. female who presents today for routine electrophysiology followup.  Since her last visit, the patient reports that she is doing reasonably well.  She reports worsening SOB for several weeks.  She also has fatigue.  Today, she denies symptoms of palpitations, chest pain,  lower extremity edema, dizziness, presyncope, syncope, or ICD shocks.  The patient is otherwise without complaint today.   Past Medical History  Diagnosis Date  . Anemia   . Atrial fibrillation     A.fib/flutter: s/p MAZE 2008, DCCV 2011, on coumadin; previously on flecainide, but dc'd due to worsening EF. Tikosyn discontinued 06/2012 after cardiac arrest.  . Diverticulitis   . CVA (cerebral vascular accident)     2008 - felt due to oscillating calcium on aortic valve  . Carcinoma in situ of vulva   . Fibroid   . Ovarian cyst, left 2008-2009  . Osteoporosis   . Diabetes mellitus   . Hypertension   . GERD (gastroesophageal reflux disease)   . Chronic systolic CHF (congestive heart failure)     EF 35%  . Ischemic cardiomyopathy     EF 35%  . Coronary artery disease     Occluded LM 2/2 previous aortic root surgery; s/p 2v CABG 2010 LIMA to LAD, SVG to OM; Cath 03/11/12 patent SVG to OM, atretic LIMA to LAD, patent RCA, occluded native LM, EF 35%   . Arthritis     OSTEO  . Fracture of lumbar spine   . 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  . Superficial venous thrombosis of upper extremity     06/2012 - LUE  . Severe aortic stenosis     s/p homograft aortic root replacement 2008  . Mitral regurgitation     s/p mitral valve repair 2008  . Thoracic aortic aneurysm     s/p resection and grafting 2008  . Stroke   . Kidney stones    Past Surgical History  Procedure Laterality Date   . Foot surgery Left     removed neuroma  . Aortic root replacement  2008    homograft  . Mitral valve replacement  2008    due to severe MR  . Resection and grafting of ascending thoracic aortic    . Aneurysm and left sided maze  2008  . Coronary artery bypass graft  4/10    LIMA to LAD, SVG to OM  . Wide excision of vulva      CA INSITU  . Cardiac defibrillator placement  06/08/2012    [place] DR  implanted by Dr Rayann Heman following a cardiac arrest    Current Outpatient Prescriptions  Medication Sig Dispense Refill  . amiodarone (PACERONE) 200 MG tablet Take 1 tablet (200 mg total) by mouth daily.  90 tablet  0  . carvedilol (COREG) 12.5 MG tablet Take 1 tablet (12.5 mg total) by mouth 2 (two) times daily with a meal.  180 tablet  3  . Cyanocobalamin (VITAMIN B-12) 2500 MCG SUBL Place 1 tablet under the tongue 3 (three) times a week. Dissolves 1 tablet under the tongue on Monday, Wednesday, and Friday      . ergocalciferol (VITAMIN D2) 50000 UNITS capsule Take 50,000 Units by mouth once a week.      . furosemide (  LASIX) 20 MG tablet TAKE 1 TABLET (20 MG TOTAL) BY MOUTH DAILY.  90 tablet  3  . linagliptin (TRADJENTA) 5 MG TABS tablet Take 1 tablet (5 mg total) by mouth daily.  30 tablet    . losartan (COZAAR) 50 MG tablet Take 1 tablet (50 mg total) by mouth daily.  90 tablet  4  . omeprazole (PRILOSEC OTC) 20 MG tablet Take 20 mg by mouth daily before breakfast.       . warfarin (COUMADIN) 3 MG tablet TAKE AS DIRECTED BY COUMADIN CLINIC  30 tablet  3   No current facility-administered medications for this visit.    Physical Exam: Filed Vitals:   06/18/13 1347  BP: 132/60  Pulse: 71  Height: 5' 3.5" (1.613 m)  Weight: 167 lb 3.2 oz (75.841 kg)    GEN- The patient is well appearing, alert and oriented x 3 today.   Head- normocephalic, atraumatic Eyes-  Sclera clear, conjunctiva pink Ears- hearing intact Oropharynx- clear Neck + JVD Lungs- bibasilar  rales, normal work of breathing Chest- ICD pocket is well healed Heart- Regular rate and [id number] 6 early systolic murmur LUSB GI- soft, NT, ND, + BS Extremities- no clubbing, cyanosis, or edema Skin- there is a small area of abraded skin along the 1st ICSpace (L parasternal) which she says is chronic and from her initial sternal incision/ wires.  This is not presently involving her ICD pocket.  ICD interrogation- reviewed in detail today,  See PACEART report  Assessment and Plan:  1. afib Well controlled with amiodarone She has mild nausea with amiodarone.  I will therefore decrease amiodarone to 100mg  daily today Continue coumadin long term  2. S/p VF arrest Normal ICD function See Pace Art report No changes today  3. Valvular heart disease/ ischemic CM Volume overloaded today Elevated coreview Increase lasix to 20mg  BID x 3 days then return to 200mg  daily.  BMET today Heather to follow with monthly ICMs  Merlin Return to see me in 1 year Follow-up with Dr Stanford Breed as scheduled

## 2013-06-25 DIAGNOSIS — E119 Type 2 diabetes mellitus without complications: Secondary | ICD-10-CM | POA: Diagnosis not present

## 2013-06-25 DIAGNOSIS — Z23 Encounter for immunization: Secondary | ICD-10-CM | POA: Diagnosis not present

## 2013-06-25 DIAGNOSIS — Z79899 Other long term (current) drug therapy: Secondary | ICD-10-CM | POA: Diagnosis not present

## 2013-06-25 DIAGNOSIS — Z6828 Body mass index (BMI) 28.0-28.9, adult: Secondary | ICD-10-CM | POA: Diagnosis not present

## 2013-06-25 DIAGNOSIS — N200 Calculus of kidney: Secondary | ICD-10-CM | POA: Diagnosis not present

## 2013-06-25 DIAGNOSIS — E11329 Type 2 diabetes mellitus with mild nonproliferative diabetic retinopathy without macular edema: Secondary | ICD-10-CM | POA: Diagnosis not present

## 2013-06-25 DIAGNOSIS — D649 Anemia, unspecified: Secondary | ICD-10-CM | POA: Diagnosis not present

## 2013-06-25 DIAGNOSIS — I509 Heart failure, unspecified: Secondary | ICD-10-CM | POA: Diagnosis not present

## 2013-06-25 DIAGNOSIS — I4891 Unspecified atrial fibrillation: Secondary | ICD-10-CM | POA: Diagnosis not present

## 2013-07-02 ENCOUNTER — Telehealth: Payer: Self-pay | Admitting: *Deleted

## 2013-07-02 ENCOUNTER — Ambulatory Visit (INDEPENDENT_AMBULATORY_CARE_PROVIDER_SITE_OTHER): Payer: Medicare Other

## 2013-07-02 DIAGNOSIS — I4891 Unspecified atrial fibrillation: Secondary | ICD-10-CM | POA: Diagnosis not present

## 2013-07-02 DIAGNOSIS — I059 Rheumatic mitral valve disease, unspecified: Secondary | ICD-10-CM | POA: Diagnosis not present

## 2013-07-02 DIAGNOSIS — Z7901 Long term (current) use of anticoagulants: Secondary | ICD-10-CM | POA: Diagnosis not present

## 2013-07-02 DIAGNOSIS — I359 Nonrheumatic aortic valve disorder, unspecified: Secondary | ICD-10-CM

## 2013-07-02 LAB — POCT INR: INR: 2.7

## 2013-07-02 NOTE — Telephone Encounter (Signed)
Pt needing tooth extraction and they need directions regarding her warfarin. Discussed with dr Stanford Breed, pt will need lovenox bridging and SBE due to her valve.  The patient left the office before the visit was finished. Message for dr rehm to call back

## 2013-07-03 NOTE — Telephone Encounter (Signed)
Spoke with Anderson Malta at Dr Mingo Amber office she states he is comfortable performing the dental extractions on pt with an INR 2.0-2.5.  Procedure has not been scheduled yet.  Will have pt call back and notify us when procedure is so that we can dosage adjust her Coumadin and schedule appt to check INR day of procedure.  Will send a copy of results with pt's INR with pt to appt day of procedure.

## 2013-07-03 NOTE — Telephone Encounter (Signed)
Spoke with pt, dosage adjusted Coumadin for upcoming dental extractions.  Advised pt to skip Monday's 1.5mg  of Coumadin, take 1.5mg  on Tuesday and made appt to check INR on 07/11/13.  INR needs to be 2.0-2.5 for dental extraction.

## 2013-07-03 NOTE — Telephone Encounter (Signed)
Spoke with pt, she called Dr Mingo Amber office she states they are deferring Coumadin management to [place].  Advised since we do not know the bleeding risks associated with her dental extractions we would not be able to decide if pt could stay on Coumadin or needed to hold prior to procedure.  Spoke with Judson Roch at Dr Mingo Amber office, she states Dr Romie Minus spoke with Dr Stanford Breed and he statesd pt would need to be bridged with Lovenox, confirmed this was true, but only if Dr Romie Minus needed pt to hold Coumadin for procedure.  Explained if Dr Romie Minus was comfortable pulling these 2 teeth with an INR of 2.0 then we could adjust Coumadin dosage and not have to bridge pt with Lovenox.  Judson Roch is going to discuss with Dr Romie Minus and call us back today.

## 2013-07-03 NOTE — Telephone Encounter (Signed)
Will forward to CV[facility]

## 2013-07-03 NOTE — Telephone Encounter (Signed)
Follow up     Pt needs a tooth extraction.  Pt said her dentist said she will need to go on lovenox and stop her coumadin.  She needs instructions on how to do this.

## 2013-07-04 DIAGNOSIS — E1165 Type 2 diabetes mellitus with hyperglycemia: Secondary | ICD-10-CM | POA: Diagnosis not present

## 2013-07-04 DIAGNOSIS — E1139 Type 2 diabetes mellitus with other diabetic ophthalmic complication: Secondary | ICD-10-CM | POA: Diagnosis not present

## 2013-07-05 ENCOUNTER — Other Ambulatory Visit: Payer: Self-pay | Admitting: *Deleted

## 2013-07-05 MED ORDER — WARFARIN SODIUM 3 MG PO TABS: ORAL_TABLET | ORAL | Status: DC

## 2013-07-11 ENCOUNTER — Ambulatory Visit (INDEPENDENT_AMBULATORY_CARE_PROVIDER_SITE_OTHER): Payer: Medicare Other | Admitting: Pharmacist

## 2013-07-11 DIAGNOSIS — Z7901 Long term (current) use of anticoagulants: Secondary | ICD-10-CM

## 2013-07-11 DIAGNOSIS — I059 Rheumatic mitral valve disease, unspecified: Secondary | ICD-10-CM | POA: Diagnosis not present

## 2013-07-11 DIAGNOSIS — I359 Nonrheumatic aortic valve disorder, unspecified: Secondary | ICD-10-CM

## 2013-07-11 DIAGNOSIS — I4891 Unspecified atrial fibrillation: Secondary | ICD-10-CM

## 2013-07-11 LAB — POCT INR: INR: 2.5

## 2013-07-12 ENCOUNTER — Telehealth: Payer: Self-pay | Admitting: *Deleted

## 2013-07-12 NOTE — Telephone Encounter (Signed)
Pt has a cordless phone that has a red light indicator stating her line is "in use." This would occur 1x/week but now occurs several times a day. I told pt I think some other component using her phone line is the cause, NOT her ICD monitor.

## 2013-07-16 DIAGNOSIS — N200 Calculus of kidney: Secondary | ICD-10-CM | POA: Diagnosis not present

## 2013-07-16 DIAGNOSIS — R310 Gross hematuria: Secondary | ICD-10-CM | POA: Diagnosis not present

## 2013-07-19 ENCOUNTER — Encounter: Payer: Self-pay | Admitting: *Deleted

## 2013-07-19 ENCOUNTER — Ambulatory Visit (INDEPENDENT_AMBULATORY_CARE_PROVIDER_SITE_OTHER): Payer: Medicare Other | Admitting: *Deleted

## 2013-07-19 DIAGNOSIS — I2589 Other forms of chronic ischemic heart disease: Secondary | ICD-10-CM | POA: Diagnosis not present

## 2013-07-19 DIAGNOSIS — Z95810 Presence of automatic (implantable) cardiac defibrillator: Secondary | ICD-10-CM | POA: Diagnosis not present

## 2013-07-19 DIAGNOSIS — I255 Ischemic cardiomyopathy: Secondary | ICD-10-CM

## 2013-07-19 NOTE — Progress Notes (Signed)
EPIC Encounter for ICM Monitoring  Patient Name: [name] is a 73 y.o. female Date: 07/19/2013 Primary Care Physican: Velna Hatchet, MD Primary Cardiologist: Stanford Breed Electrophysiologist: Allred Dry Weight: 167 lbs       In the past month, have you:  1. Gained more than 2 pounds in a day or more than 5 pounds in a week? no  2. Had changes in your medications (with verification of current medications)? No. She did recently finish a course of prednisone.  3. Had more shortness of breath than is usual for you? no  4. Limited your activity because of shortness of breath? no  5. Not been able to sleep because of shortness of breath? no  6. Had increased swelling in your feet or ankles? no  7. Had symptoms of dehydration (dizziness, dry mouth, increased thirst, decreased urine output) no  8. Had changes in sodium restriction? no  9. Been compliant with medication? Yes   ICM trend:   Follow-up plan: ICM clinic phone appointment: 08/20/13  Copy of note sent to patient's primary care physician, primary cardiologist, and device following physician.  Emily Filbert, RN, BSN 07/19/2013 5:51 PM

## 2013-07-23 ENCOUNTER — Ambulatory Visit (INDEPENDENT_AMBULATORY_CARE_PROVIDER_SITE_OTHER): Payer: Medicare Other | Admitting: *Deleted

## 2013-07-23 DIAGNOSIS — I059 Rheumatic mitral valve disease, unspecified: Secondary | ICD-10-CM | POA: Diagnosis not present

## 2013-07-23 DIAGNOSIS — Z7901 Long term (current) use of anticoagulants: Secondary | ICD-10-CM

## 2013-07-23 DIAGNOSIS — I4891 Unspecified atrial fibrillation: Secondary | ICD-10-CM

## 2013-07-23 DIAGNOSIS — I359 Nonrheumatic aortic valve disorder, unspecified: Secondary | ICD-10-CM

## 2013-07-23 LAB — POCT INR: INR: 2.7

## 2013-08-06 ENCOUNTER — Ambulatory Visit (INDEPENDENT_AMBULATORY_CARE_PROVIDER_SITE_OTHER): Payer: Medicare Other | Admitting: *Deleted

## 2013-08-06 DIAGNOSIS — I359 Nonrheumatic aortic valve disorder, unspecified: Secondary | ICD-10-CM

## 2013-08-06 DIAGNOSIS — I059 Rheumatic mitral valve disease, unspecified: Secondary | ICD-10-CM

## 2013-08-06 DIAGNOSIS — I4891 Unspecified atrial fibrillation: Secondary | ICD-10-CM | POA: Diagnosis not present

## 2013-08-06 DIAGNOSIS — Z7901 Long term (current) use of anticoagulants: Secondary | ICD-10-CM | POA: Diagnosis not present

## 2013-08-06 LAB — POCT INR: INR: 2

## 2013-08-13 DIAGNOSIS — Z6829 Body mass index (BMI) 29.0-29.9, adult: Secondary | ICD-10-CM | POA: Diagnosis not present

## 2013-08-13 DIAGNOSIS — D649 Anemia, unspecified: Secondary | ICD-10-CM | POA: Diagnosis not present

## 2013-08-13 DIAGNOSIS — K900 Celiac disease: Secondary | ICD-10-CM | POA: Diagnosis not present

## 2013-08-16 ENCOUNTER — Telehealth: Payer: Self-pay | Admitting: Internal Medicine

## 2013-08-16 NOTE — Telephone Encounter (Signed)
New message    Talk to [facility].  Pt has a coumadin appt on Monday and Nira Conn is scheduled to call pt to check her fluid level---do you want to talk to her while she is her on Monday?

## 2013-08-16 NOTE — Telephone Encounter (Signed)
Informed patient that [name] will not be in the ofc until Tuesday. Patient voiced understanding and stated that she would be out of town until next weekend. Message sent to [facility] making her aware.

## 2013-08-20 ENCOUNTER — Encounter: Payer: Self-pay | Admitting: *Deleted

## 2013-08-20 ENCOUNTER — Ambulatory Visit (INDEPENDENT_AMBULATORY_CARE_PROVIDER_SITE_OTHER): Payer: Medicare Other | Admitting: *Deleted

## 2013-08-20 DIAGNOSIS — I359 Nonrheumatic aortic valve disorder, unspecified: Secondary | ICD-10-CM

## 2013-08-20 DIAGNOSIS — I059 Rheumatic mitral valve disease, unspecified: Secondary | ICD-10-CM

## 2013-08-20 DIAGNOSIS — Z7901 Long term (current) use of anticoagulants: Secondary | ICD-10-CM

## 2013-08-20 DIAGNOSIS — Z95810 Presence of automatic (implantable) cardiac defibrillator: Secondary | ICD-10-CM | POA: Diagnosis not present

## 2013-08-20 DIAGNOSIS — I255 Ischemic cardiomyopathy: Secondary | ICD-10-CM

## 2013-08-20 DIAGNOSIS — I2589 Other forms of chronic ischemic heart disease: Secondary | ICD-10-CM

## 2013-08-20 DIAGNOSIS — I4891 Unspecified atrial fibrillation: Secondary | ICD-10-CM

## 2013-08-20 LAB — POCT INR: INR: 2.5

## 2013-08-20 NOTE — Progress Notes (Signed)
EPIC Encounter for ICM Monitoring  Patient Name: [name] is a 73 y.o. female Date: 08/20/2013 Primary Care Physican: Velna Hatchet, MD Primary Cardiologist: Stanford Breed  Electrophysiologist: Allred Dry Weight: 167 lbs       In the past month, have you:  1. Gained more than 2 pounds in a day or more than 5 pounds in a week? no  2. Had changes in your medications (with verification of current medications)? no  3. Had more shortness of breath than is usual for you? no  4. Limited your activity because of shortness of breath? no  5. Not been able to sleep because of shortness of breath? no  6. Had increased swelling in your feet or ankles? no  7. Had symptoms of dehydration (dizziness, dry mouth, increased thirst, decreased urine output) no  8. Had changes in sodium restriction? no  9. Been compliant with medication? Yes  ** The patient states she will be having an iron infusion and blood transfusion on 09/06/13 due to her hemoglobin being around 8. **   ICM trend:   Follow-up plan: ICM clinic phone appointment: 09/20/13- full transmission. I have advised the patient to call if she notices any symptoms of fluid retention after her blood transfusion.  Copy of note sent to patient's primary care physician, primary cardiologist, and device following physician.  Alvis Lemmings, RN, BSN 08/20/2013 2:23 PM

## 2013-08-23 ENCOUNTER — Ambulatory Visit (HOSPITAL_COMMUNITY): Payer: Medicare Other

## 2013-09-04 ENCOUNTER — Encounter (HOSPITAL_COMMUNITY)
Admission: RE | Admit: 2013-09-04 | Discharge: 2013-09-04 | Disposition: A | Discharge status: Home / Self Care | Payer: Medicare Other | Source: Ambulatory Visit | Attending: Internal Medicine | Admitting: Internal Medicine

## 2013-09-04 DIAGNOSIS — D509 Iron deficiency anemia, unspecified: Secondary | ICD-10-CM | POA: Diagnosis not present

## 2013-09-05 ENCOUNTER — Ambulatory Visit (HOSPITAL_COMMUNITY)
Admission: RE | Admit: 2013-09-05 | Discharge: 2013-09-05 | Disposition: A | Discharge status: Home / Self Care | Payer: Medicare Other | Source: Ambulatory Visit | Attending: Internal Medicine | Admitting: Internal Medicine

## 2013-09-05 LAB — PREPARE RBC (CROSSMATCH)

## 2013-09-05 LAB — ABO/RH: ABO/RH(D): A NEG

## 2013-09-06 ENCOUNTER — Encounter (HOSPITAL_COMMUNITY): Payer: Self-pay

## 2013-09-06 ENCOUNTER — Other Ambulatory Visit (HOSPITAL_COMMUNITY): Payer: Self-pay | Admitting: Internal Medicine

## 2013-09-06 ENCOUNTER — Other Ambulatory Visit (HOSPITAL_COMMUNITY): Payer: Self-pay | Admitting: *Deleted

## 2013-09-06 ENCOUNTER — Ambulatory Visit (INDEPENDENT_AMBULATORY_CARE_PROVIDER_SITE_OTHER): Payer: Medicare Other | Admitting: *Deleted

## 2013-09-06 ENCOUNTER — Encounter (HOSPITAL_COMMUNITY)
Admission: RE | Admit: 2013-09-06 | Discharge: 2013-09-06 | Disposition: A | Discharge status: Home / Self Care | Payer: Medicare Other | Source: Ambulatory Visit | Attending: Internal Medicine | Admitting: Internal Medicine

## 2013-09-06 DIAGNOSIS — I059 Rheumatic mitral valve disease, unspecified: Secondary | ICD-10-CM | POA: Diagnosis not present

## 2013-09-06 DIAGNOSIS — I359 Nonrheumatic aortic valve disorder, unspecified: Secondary | ICD-10-CM

## 2013-09-06 DIAGNOSIS — D509 Iron deficiency anemia, unspecified: Secondary | ICD-10-CM | POA: Insufficient documentation

## 2013-09-06 DIAGNOSIS — Z7901 Long term (current) use of anticoagulants: Secondary | ICD-10-CM

## 2013-09-06 DIAGNOSIS — I4891 Unspecified atrial fibrillation: Secondary | ICD-10-CM

## 2013-09-06 LAB — POCT INR: INR: 2.5

## 2013-09-06 LAB — PREPARE RBC (CROSSMATCH)

## 2013-09-06 MED ORDER — SODIUM CHLORIDE 0.9 % IV SOLN
1020.0000 mg | Freq: Once | INTRAVENOUS | Status: AC
  Administered 2013-09-06: 1020 mg via INTRAVENOUS
  Filled 2013-09-06: qty 34

## 2013-09-06 MED ORDER — SODIUM CHLORIDE 0.9 % IV SOLN
Freq: Once | INTRAVENOUS | Status: AC
  Administered 2013-09-06: 10:00:00 via INTRAVENOUS

## 2013-09-06 NOTE — Progress Notes (Signed)
Pt finished 1 unit of PRBC's at 1345, pt tolerated well.  D/c home with husband.

## 2013-09-06 NOTE — Progress Notes (Signed)
feraheme 1020mg  given without incident, completed at 1015.  Will go ahead with 1 unit blood transfusion at this time.

## 2013-09-06 NOTE — Discharge Instructions (Signed)
Ferumoxytol injection What is this medicine? FERUMOXYTOL is an iron complex. Iron is used to make healthy red blood cells, which carry oxygen and nutrients throughout the body. This medicine is used to treat iron deficiency anemia in people with chronic kidney disease. This medicine may be used for other purposes; ask your health care provider or pharmacist if you have questions. COMMON BRAND NAME(S): Feraheme What should I tell my health care provider before I take this medicine? They need to know if you have any of these conditions: -anemia not caused by low iron levels -high levels of iron in the blood -magnetic resonance imaging (MRI) test scheduled -an unusual or allergic reaction to iron, other medicines, foods, dyes, or preservatives -pregnant or trying to get pregnant -breast-feeding How should I use this medicine? This medicine is for injection into a vein. It is given by a health care professional in a hospital or clinic setting. Talk to your pediatrician regarding the use of this medicine in children. Special care may be needed. Overdosage: If you think you've taken too much of this medicine contact a poison control center or emergency room at once. Overdosage: If you think you have taken too much of this medicine contact a poison control center or emergency room at once. NOTE: This medicine is only for you. Do not share this medicine with others. What if I miss a dose? It is important not to miss your dose. Call your doctor or health care professional if you are unable to keep an appointment. What may interact with this medicine? This medicine may interact with the following medications: -other iron products This list may not describe all possible interactions. Give your health care provider a list of all the medicines, herbs, non-prescription drugs, or dietary supplements you use. Also tell them if you smoke, drink alcohol, or use illegal drugs. Some items may interact with your  medicine. What should I watch for while using this medicine? Visit your doctor or healthcare professional regularly. Tell your doctor or healthcare professional if your symptoms do not start to get better or if they get worse. You may need blood work done while you are taking this medicine. You may need to follow a special diet. Talk to your doctor. Foods that contain iron include: whole grains/cereals, dried fruits, beans, or peas, leafy green vegetables, and organ meats (liver, kidney). What side effects may I notice from receiving this medicine? Side effects that you should report to your doctor or health care professional as soon as possible: -allergic reactions like skin rash, itching or hives, swelling of the face, lips, or tongue -breathing problems -changes in blood pressure -feeling faint or lightheaded, falls -fever or chills -flushing, sweating, or hot feelings -swelling of the ankles or feet Side effects that usually do not require medical attention (Report these to your doctor or health care professional if they continue or are bothersome.): -diarrhea -headache -nausea, vomiting -stomach pain This list may not describe all possible side effects. Call your doctor for medical advice about side effects. You may report side effects to FDA at 1-800-FDA-1088. Where should I keep my medicine? This drug is given in a hospital or clinic and will not be stored at home. NOTE: This sheet is a summary. It may not cover all possible information. If you have questions about this medicine, talk to your doctor, pharmacist, or health care provider.  2015, Elsevier/Gold Standard. (2011-10-08 15:23:36)  Blood Transfusion  A blood transfusion replaces your blood or some of its   parts. Blood is replaced when you have lost blood because of surgery, an accident, or for severe blood conditions like anemia. You can donate blood to be used on yourself if you have a planned surgery. If you lose blood  during that surgery, your own blood can be given back to you. Any blood given to you is checked to make sure it matches your blood type. Your temperature, blood pressure, and heart rate (vital signs) will be checked often.  GET HELP RIGHT AWAY IF:   You feel sick to your stomach (nauseous) or throw up (vomit).  You have watery poop (diarrhea).  You have shortness of breath or trouble breathing.  You have blood in your pee (urine) or have dark colored pee.  You have chest pain or tightness.  Your eyes or skin turn yellow (jaundice).  You have a temperature by mouth above 102 F (38.9 C), not controlled by medicine.  You start to shake and have chills.  You develop a a red rash (hives) or feel itchy.  You develop lightheadedness or feel confused.  You develop back, joint, or muscle pain.  You do not feel hungry (lost appetite).  You feel tired, restless, or nervous.  You develop belly (abdominal) cramps. Document Released: 05/21/2008 Document Revised: 05/17/2011 Document Reviewed: 05/21/2008 ExitCare Patient Information 2015 [employer]. This information is not intended to replace advice given to you by your health care provider. Make sure you discuss any questions you have with your health care provider.  

## 2013-09-07 LAB — TYPE AND SCREEN
ABO/RH(D): A NEG
ANTIBODY SCREEN: NEGATIVE
Unit division: 0

## 2013-09-09 ENCOUNTER — Other Ambulatory Visit: Payer: Self-pay | Admitting: Internal Medicine

## 2013-09-24 ENCOUNTER — Ambulatory Visit (INDEPENDENT_AMBULATORY_CARE_PROVIDER_SITE_OTHER): Payer: Medicare Other

## 2013-09-24 DIAGNOSIS — I359 Nonrheumatic aortic valve disorder, unspecified: Secondary | ICD-10-CM

## 2013-09-24 DIAGNOSIS — I059 Rheumatic mitral valve disease, unspecified: Secondary | ICD-10-CM

## 2013-09-24 DIAGNOSIS — I4891 Unspecified atrial fibrillation: Secondary | ICD-10-CM | POA: Diagnosis not present

## 2013-09-24 DIAGNOSIS — Z7901 Long term (current) use of anticoagulants: Secondary | ICD-10-CM | POA: Diagnosis not present

## 2013-09-24 LAB — POCT INR: INR: 2.6

## 2013-09-25 ENCOUNTER — Telehealth: Payer: Self-pay | Admitting: Internal Medicine

## 2013-09-25 NOTE — Telephone Encounter (Signed)
Patient is requesting that Dr. Rayann Heman provide verification that she can get Rx of Pacerone (not generic) in 100 mg tabs instead of the 200 mg tabs that she has been cutting in two. I offered to refill her prescription today but she wanted Dr. Jackalyn Lombard input since this was a "recent" change for her dosage (April 2015) and he may want to keep her on the 200 mg tabs in case she has to adjust it again. She states she is doing very well on it though and she does prefer the Pacerone rx over the generic version. Will route to Dr. Alphonzo Grieve. Madilyn Fireman, RN

## 2013-09-25 NOTE — Telephone Encounter (Signed)
100mg  tabs would be fine

## 2013-09-25 NOTE — Telephone Encounter (Signed)
New message     Talk to a nurse regarding a change in pacerone

## 2013-09-27 ENCOUNTER — Telehealth: Payer: Self-pay | Admitting: Internal Medicine

## 2013-09-27 ENCOUNTER — Other Ambulatory Visit: Payer: Self-pay

## 2013-09-27 ENCOUNTER — Encounter: Payer: Self-pay | Admitting: Internal Medicine

## 2013-09-27 ENCOUNTER — Encounter: Payer: Self-pay | Admitting: *Deleted

## 2013-09-27 ENCOUNTER — Ambulatory Visit (INDEPENDENT_AMBULATORY_CARE_PROVIDER_SITE_OTHER): Payer: Medicare Other | Admitting: *Deleted

## 2013-09-27 DIAGNOSIS — I2589 Other forms of chronic ischemic heart disease: Secondary | ICD-10-CM | POA: Diagnosis not present

## 2013-09-27 DIAGNOSIS — Z95810 Presence of automatic (implantable) cardiac defibrillator: Secondary | ICD-10-CM

## 2013-09-27 DIAGNOSIS — I255 Ischemic cardiomyopathy: Secondary | ICD-10-CM

## 2013-09-27 MED ORDER — AMIODARONE HCL 200 MG PO TABS: 100.0000 mg | ORAL_TABLET | Freq: Every day | ORAL | Status: DC

## 2013-09-27 MED ORDER — AMIODARONE HCL 100 MG PO TABS: 100.0000 mg | ORAL_TABLET | Freq: Every day | ORAL | Status: DC

## 2013-09-27 NOTE — Progress Notes (Signed)
Remote ICD transmission.   

## 2013-09-27 NOTE — Telephone Encounter (Signed)
Sent in the prescrption for 200mg  to bring it to a Tier I

## 2013-09-27 NOTE — Telephone Encounter (Signed)
°  Patient has a problem with a prescription, she wants to talk to [place] only regarding this, please call and advise.

## 2013-09-27 NOTE — Telephone Encounter (Signed)
Patient called saying that pharmacy has the Rx for Pacerone 100 mg tabs, however the cost is $900. She is confused because last time "I only paid $90 and I need help figuring this out". Triage nurse called pharmacy to clarify Rx/cost. Pharmacist stated that [place] had recently changed it to where Pacerone is no longer on the formulary. Triage nurse called patient back and advised her on this recent formulary change by [facility] and what that meant in regards to Brand name vs generic medications. Discussed that she could inform pharmacist that she will take the generic Rx so that she stays on therapy and then she can appeal to [facility] about them approving her to have Pacerone covered under her insurance. She verbalized understanding and agreement with the plan.

## 2013-09-27 NOTE — Progress Notes (Signed)
EPIC Encounter for ICM Monitoring  Patient Name: [name] is a 73 y.o. female Date: 09/27/2013 Primary Care Physican: Velna Hatchet, MD Primary Cardiologist: Stanford Breed Electrophysiologist: Allred Dry Weight: 165 lbs       In the past month, have you:  1. Gained more than 2 pounds in a day or more than 5 pounds in a week? no  2. Had changes in your medications (with verification of current medications)? no  3. Had more shortness of breath than is usual for you? no  4. Limited your activity because of shortness of breath? no  5. Not been able to sleep because of shortness of breath? no  6. Had increased swelling in your feet or ankles? no  7. Had symptoms of dehydration (dizziness, dry mouth, increased thirst, decreased urine output) no  8. Had changes in sodium restriction? no  9. Been compliant with medication? Yes  ** The patient did have her iron and blood transfusion x 1 unit on 09/06/13. She states she has been feeling well since then. She is due to follow up with her PCP on 10/01/13. **   ICM trend:   Follow-up plan: ICM clinic phone appointment: 10/29/13  Copy of note sent to patient's primary care physician, primary cardiologist, and device following physician.  Alvis Lemmings, RN, BSN 09/27/2013 10:35 AM

## 2013-10-01 DIAGNOSIS — D649 Anemia, unspecified: Secondary | ICD-10-CM | POA: Diagnosis not present

## 2013-10-01 DIAGNOSIS — I10 Essential (primary) hypertension: Secondary | ICD-10-CM | POA: Diagnosis not present

## 2013-10-01 DIAGNOSIS — E119 Type 2 diabetes mellitus without complications: Secondary | ICD-10-CM | POA: Diagnosis not present

## 2013-10-01 DIAGNOSIS — Z6829 Body mass index (BMI) 29.0-29.9, adult: Secondary | ICD-10-CM | POA: Diagnosis not present

## 2013-10-01 DIAGNOSIS — M810 Age-related osteoporosis without current pathological fracture: Secondary | ICD-10-CM | POA: Diagnosis not present

## 2013-10-01 DIAGNOSIS — D509 Iron deficiency anemia, unspecified: Secondary | ICD-10-CM | POA: Diagnosis not present

## 2013-10-01 DIAGNOSIS — I4891 Unspecified atrial fibrillation: Secondary | ICD-10-CM | POA: Diagnosis not present

## 2013-10-01 LAB — MDC_IDC_ENUM_SESS_TYPE_REMOTE
Brady Statistic RA Percent Paced: 16 %
HighPow Impedance: 64 Ohm
Implantable Pulse Generator Model: 2357
Implantable Pulse Generator Serial Number: 7080516
Lead Channel Impedance Value: 400 Ohm
Lead Channel Sensing Intrinsic Amplitude: 12 mV
MDC IDC MSMT LEADCHNL RA SENSING INTR AMPL: 5 mV
MDC IDC MSMT LEADCHNL RV IMPEDANCE VALUE: 540 Ohm
MDC IDC STAT BRADY RV PERCENT PACED: 1 %

## 2013-10-17 DIAGNOSIS — E559 Vitamin D deficiency, unspecified: Secondary | ICD-10-CM | POA: Diagnosis not present

## 2013-10-17 DIAGNOSIS — M810 Age-related osteoporosis without current pathological fracture: Secondary | ICD-10-CM | POA: Diagnosis not present

## 2013-10-18 ENCOUNTER — Encounter: Payer: Self-pay | Admitting: Cardiology

## 2013-10-22 ENCOUNTER — Encounter: Payer: Self-pay | Admitting: Cardiology

## 2013-10-22 ENCOUNTER — Ambulatory Visit (INDEPENDENT_AMBULATORY_CARE_PROVIDER_SITE_OTHER): Payer: Medicare Other | Admitting: Pharmacist

## 2013-10-22 DIAGNOSIS — Z7901 Long term (current) use of anticoagulants: Secondary | ICD-10-CM | POA: Diagnosis not present

## 2013-10-22 DIAGNOSIS — I059 Rheumatic mitral valve disease, unspecified: Secondary | ICD-10-CM

## 2013-10-22 DIAGNOSIS — I4891 Unspecified atrial fibrillation: Secondary | ICD-10-CM

## 2013-10-22 DIAGNOSIS — I359 Nonrheumatic aortic valve disorder, unspecified: Secondary | ICD-10-CM

## 2013-10-22 LAB — POCT INR: INR: 2.6

## 2013-10-29 ENCOUNTER — Ambulatory Visit (INDEPENDENT_AMBULATORY_CARE_PROVIDER_SITE_OTHER): Payer: Medicare Other | Admitting: *Deleted

## 2013-10-29 ENCOUNTER — Encounter: Payer: Self-pay | Admitting: *Deleted

## 2013-10-29 DIAGNOSIS — I2589 Other forms of chronic ischemic heart disease: Secondary | ICD-10-CM

## 2013-10-29 DIAGNOSIS — Z95810 Presence of automatic (implantable) cardiac defibrillator: Secondary | ICD-10-CM | POA: Diagnosis not present

## 2013-10-29 DIAGNOSIS — I255 Ischemic cardiomyopathy: Secondary | ICD-10-CM

## 2013-10-29 NOTE — Progress Notes (Signed)
EPIC Encounter for ICM Monitoring  Patient Name: [name] is a 73 y.o. female Date: 10/29/2013 Primary Care Physican: Velna Hatchet, MD Primary Cardiologist: Stanford Breed Electrophysiologist: Allred Dry Weight: 165 lba       In the past month, have you:  1. Gained more than 2 pounds in a day or more than 5 pounds in a week? No. Weight today is 167 lbs  2. Had changes in your medications (with verification of current medications)? Yes. She was instructed by her orthopedic doctor to start Vit D3 1000 IU twice weekly.  3. Had more shortness of breath than is usual for you? no  4. Limited your activity because of shortness of breath? no  5. Not been able to sleep because of shortness of breath? no  6. Had increased swelling in your feet or ankles? no  7. Had symptoms of dehydration (dizziness, dry mouth, increased thirst, decreased urine output) no  8. Had changes in sodium restriction? no  9. Been compliant with medication? Yes   ICM trend:   Follow-up plan: ICM clinic phone appointment: 11/29/13  Copy of note sent to patient's primary care physician, primary cardiologist, and device following physician.  Alvis Lemmings, RN, BSN 10/29/2013 11:40 AM

## 2013-11-14 ENCOUNTER — Other Ambulatory Visit: Payer: Self-pay

## 2013-11-14 MED ORDER — WARFARIN SODIUM 3 MG PO TABS: 1.5000 mg | ORAL_TABLET | Freq: Every day | ORAL | Status: DC

## 2013-11-27 ENCOUNTER — Ambulatory Visit (INDEPENDENT_AMBULATORY_CARE_PROVIDER_SITE_OTHER): Payer: Medicare Other | Admitting: Gynecology

## 2013-11-27 ENCOUNTER — Encounter: Payer: Self-pay | Admitting: Gynecology

## 2013-11-27 VITALS — BP 124/76 | Ht 63.5 in | Wt 170.0 lb

## 2013-11-27 DIAGNOSIS — D251 Intramural leiomyoma of uterus: Secondary | ICD-10-CM

## 2013-11-27 DIAGNOSIS — N83209 Unspecified ovarian cyst, unspecified side: Secondary | ICD-10-CM | POA: Diagnosis not present

## 2013-11-27 DIAGNOSIS — I2589 Other forms of chronic ischemic heart disease: Secondary | ICD-10-CM | POA: Diagnosis not present

## 2013-11-27 DIAGNOSIS — M810 Age-related osteoporosis without current pathological fracture: Secondary | ICD-10-CM | POA: Diagnosis not present

## 2013-11-27 DIAGNOSIS — N952 Postmenopausal atrophic vaginitis: Secondary | ICD-10-CM | POA: Diagnosis not present

## 2013-11-27 NOTE — Progress Notes (Signed)
[name] 09/07/1940 [id number]        73 y.o.  G0P0 for follow up exam. Former patient Dr. Valeta Hamilton. Several issues noted below.  Past medical history,surgical history, problem list, medications, allergies, family history and social history were all reviewed and documented as reviewed in the EPIC chart.  ROS:  12 system ROS performed with pertinent positives and negatives included in the history, assessment and plan.   Additional significant findings :  none   Exam: [name] Vitals:   11/27/13 1149  BP: 124/76  Height: 5' 3.5" (1.613 m)  Weight: 170 lb (77.111 kg)   General appearance:  Normal affect, orientation and appearance. Skin: Grossly normal HEENT: Without gross lesions.  No cervical or supraclavicular adenopathy. Thyroid normal.  Lungs:  Clear without wheezing, rales or rhonchi Cardiac: RR, without RMG Abdominal:  Soft, nontender, without masses, guarding, rebound, organomegaly or hernia Breasts:  Examined lying and sitting without masses, retractions, discharge or axillary adenopathy. Pelvic:  Ext/BUS/vagina with generalized atrophic changes  Cervix with atrophic changes  Uterus grossly normal size, nontender,exam somewhat limited by abdominal girth.  Adnexa  Without gross masses or tenderness    Anus and perineum  Normal   Rectovaginal  Normal sphincter tone without palpated masses or tenderness.    Assessment/Plan:  73 y.o. G0P0 female for follow up exam.   1. Ovarian cyst.  Patient was being followed by Dr. Cherylann Banas for a persistent left ovarian cyst. Initially discovered 2008 when a CT scan was done for other reasons and this was discovered. They follow up ultrasound showed multiple myomas and a thin walled avascular echo-free cyst left ovary 59 mm. She has had multiple ultrasounds since then to include her last one in 2013 showing the cyst has decreased to 37 mm again thin-walled avascular. The patient is having no pain or other symptoms. She has a  terrible cardiovascular history to include cardiac surgery atrial fibrillation ischemic cardiomyopathy history of ventricular fibrillation now with indwelling defibrillator. I reviewed with her the realistic issues as far as should we repeat the ultrasound now and what we would do with the results. We would really have to be hard pressed to want to do surgery given her medical issues and given that the cyst was getting smaller I feel comfortable not rescanning her at this point. Alternative would be to repeat the ultrasound now again not obligating any further activity but from a piece of mind standpoint. Patient would prefer to repeat the ultrasound and she will schedule this in follow up for this. 2. Osteoporosis. Patient has started Prolia last year through her orthopedic office and has planned follow up DEXA through them next year. She'll continue to be followed by them for her osteoporosis. 3. Leiomyoma on serial ultrasounds. Her exam shows uterus to be grossly normal. Will restudy them now with the above ordered ultrasound. 4. Postmenopausal/atrophic genital changes. Patient without significant symptoms to include hot flashes night sweats or vaginal dryness. No vaginal bleeding. Will continue to monitor. 5. History of carcinoma in situ of the vulva. Status post wide local excision 2001.  Exam is  NED. We'll continue to monitor. 6. Mammography scheduled for next month. SBE monthly reviewed. 7. Pap smear 2010. No Pap smear done today. No history of significant abnormal Pap smears. Reviewed current screening guidelines and will plan to stop screening issues over the age of 52 and she is comfortable with this. 8. Colonoscopy 2009. Repeat at their recommended interval. 9. Health maintenance. No routine  lab work done as she has this done through her primary physician's office. She also follows up with the urologist in reference to her kidney stones. The patient will follow up for the  ultrasound.     Anastasio Auerbach MD, 12:21 PM 11/27/2013

## 2013-11-27 NOTE — Patient Instructions (Signed)
Follow up for ultrasound as scheduled 

## 2013-11-29 ENCOUNTER — Ambulatory Visit (INDEPENDENT_AMBULATORY_CARE_PROVIDER_SITE_OTHER): Payer: Medicare Other | Admitting: *Deleted

## 2013-11-29 ENCOUNTER — Encounter: Payer: Self-pay | Admitting: *Deleted

## 2013-11-29 DIAGNOSIS — I2589 Other forms of chronic ischemic heart disease: Secondary | ICD-10-CM | POA: Diagnosis not present

## 2013-11-29 DIAGNOSIS — I255 Ischemic cardiomyopathy: Secondary | ICD-10-CM

## 2013-11-29 DIAGNOSIS — Z95810 Presence of automatic (implantable) cardiac defibrillator: Secondary | ICD-10-CM | POA: Diagnosis not present

## 2013-11-29 NOTE — Progress Notes (Signed)
EPIC Encounter for ICM Monitoring  Patient Name: [name] is a 73 y.o. female Date: 11/29/2013 Primary Care Physican: Velna Hatchet, MD Primary Cardiologist: Stanford Breed Electrophysiologist: Allred Dry Weight: 165 lbs       In the past month, have you:  1. Gained more than 2 pounds in a day or more than 5 pounds in a week? Her baseline weight may be shifting. She is noticing more of her weights around 168 lbs. She thinks she may be eating more.   2. Had changes in your medications (with verification of current medications)? no  3. Had more shortness of breath than is usual for you? no  4. Limited your activity because of shortness of breath? no  5. Not been able to sleep because of shortness of breath? no  6. Had increased swelling in your feet or ankles? no  7. Had symptoms of dehydration (dizziness, dry mouth, increased thirst, decreased urine output) no  8. Had changes in sodium restriction? no  9. Been compliant with medication? Yes   ICM trend:   Follow-up plan: ICM clinic phone appointment: 12/31/13- full transmission. The patient's corvue readings were up slightly last week. She states that she went on a short trip. She is currently back to baseline. No changes made today.  Copy of note sent to patient's primary care physician, primary cardiologist, and device following physician.  Alvis Lemmings, RN, BSN 11/29/2013 12:01 PM

## 2013-12-03 ENCOUNTER — Ambulatory Visit (INDEPENDENT_AMBULATORY_CARE_PROVIDER_SITE_OTHER): Payer: Medicare Other | Admitting: *Deleted

## 2013-12-03 DIAGNOSIS — Z7901 Long term (current) use of anticoagulants: Secondary | ICD-10-CM | POA: Diagnosis not present

## 2013-12-03 DIAGNOSIS — I059 Rheumatic mitral valve disease, unspecified: Secondary | ICD-10-CM

## 2013-12-03 DIAGNOSIS — I4891 Unspecified atrial fibrillation: Secondary | ICD-10-CM

## 2013-12-03 DIAGNOSIS — I359 Nonrheumatic aortic valve disorder, unspecified: Secondary | ICD-10-CM

## 2013-12-03 LAB — POCT INR: INR: 2.8

## 2013-12-04 ENCOUNTER — Other Ambulatory Visit: Payer: Self-pay

## 2013-12-04 MED ORDER — LOSARTAN POTASSIUM 50 MG PO TABS: 50.0000 mg | ORAL_TABLET | Freq: Every day | ORAL | Status: DC

## 2013-12-10 ENCOUNTER — Ambulatory Visit (INDEPENDENT_AMBULATORY_CARE_PROVIDER_SITE_OTHER): Payer: Medicare Other

## 2013-12-10 ENCOUNTER — Other Ambulatory Visit: Payer: Self-pay | Admitting: Gynecology

## 2013-12-10 ENCOUNTER — Encounter: Payer: Self-pay | Admitting: Gynecology

## 2013-12-10 ENCOUNTER — Ambulatory Visit (INDEPENDENT_AMBULATORY_CARE_PROVIDER_SITE_OTHER): Payer: Medicare Other | Admitting: Gynecology

## 2013-12-10 DIAGNOSIS — N852 Hypertrophy of uterus: Secondary | ICD-10-CM

## 2013-12-10 DIAGNOSIS — R938 Abnormal findings on diagnostic imaging of other specified body structures: Secondary | ICD-10-CM

## 2013-12-10 DIAGNOSIS — N8320 Unspecified ovarian cysts: Secondary | ICD-10-CM | POA: Diagnosis not present

## 2013-12-10 DIAGNOSIS — R9389 Abnormal findings on diagnostic imaging of other specified body structures: Secondary | ICD-10-CM

## 2013-12-10 DIAGNOSIS — N83202 Unspecified ovarian cyst, left side: Secondary | ICD-10-CM

## 2013-12-10 DIAGNOSIS — I255 Ischemic cardiomyopathy: Secondary | ICD-10-CM

## 2013-12-10 DIAGNOSIS — N83209 Unspecified ovarian cyst, unspecified side: Secondary | ICD-10-CM

## 2013-12-10 DIAGNOSIS — D251 Intramural leiomyoma of uterus: Secondary | ICD-10-CM | POA: Diagnosis not present

## 2013-12-10 DIAGNOSIS — D252 Subserosal leiomyoma of uterus: Secondary | ICD-10-CM

## 2013-12-10 DIAGNOSIS — N83339 Acquired atrophy of ovary and fallopian tube, unspecified side: Secondary | ICD-10-CM

## 2013-12-10 DIAGNOSIS — N8333 Acquired atrophy of ovary and fallopian tube: Secondary | ICD-10-CM

## 2013-12-10 NOTE — Patient Instructions (Signed)
Follow up when you are due for your annual exam, sooner if any issues.

## 2013-12-10 NOTE — Progress Notes (Signed)
[name] May 18, 1940 [id number]        73 y.o.  G0P0 Presents for follow up ultrasound. History of a persistent avascular echo-free cyst of the left ovary initially measured in 2008 59 mm. Also multiple leiomyomata.  Past medical history,surgical history, problem list, medications, allergies, family history and social history were all reviewed and documented in the EPIC chart.  Directed ROS with pertinent positives and negatives documented in the history of present illness/assessment and plan.  Ultrasound today shows uterus overall normal size. Endometrial echo 9 mm. Difficult to visualize. Multiple leiomyoma noted the largest measuring 57, 55, 28, 26, and 20. Right ovary atrophic. Left ovary with thin-walled, echo-free cyst, avascular at 39 mm mean.  Assessment/Plan:  73 y.o. G0P0 with multiple leiomyoma overall stable from prior studies. Left ovarian cyst, echo-free, avascular essentially unchanged from prior study 2013 where it measured 37 and now 39 which I think is measurement error. Endometrial echo generous at 9 mm but difficult to measure. Did measure 8.6 millimeters in 2013 which I think overall is stable unchanged. She has done no vaginal bleeding. I reviewed with her the options to include endometrial sampling versus observation. Given all of her other issues and stability of measurement over years time we both feel comfortable with expectant management at this point and she'll report any vaginal bleeding otherwise follow up in one year when she is due for her annual exam.     Anastasio Auerbach MD, 11:42 AM 12/10/2013

## 2013-12-28 ENCOUNTER — Telehealth: Payer: Self-pay | Admitting: Physician Assistant

## 2013-12-28 NOTE — Telephone Encounter (Addendum)
Patient complains of general malaise feeling today in addition to some vague chest and abdominal discomfort. She has also been constipated over the last day or so. She developed some palpitations this p.m. and took her heart rate. Her heart rate has been as high as 140 and she feels that it is irregular. She is concerned that she is in atrial fibrillation and wanted to know what to do.  She denies shortness of breath, presyncope or syncope. She is not currently having any chest discomfort. She has just taken her evening medications including Coreg 12.5 mg, Cozaar 50 mg and Coumadin.  Advised patient she could take an extra amiodarone 200 mg tablet if her heart rate does not improve in her palpitations do not resolve after allowing the carvedilol to have full effect. Advised the patient that if she had any other significant symptoms, she should call 911 and come directly to the emergency room at [facility]. The patient does not feel she needs to be seen at the emergency room at this time.  Addendum:  Pt called back about 9:24 pm. She took the rx as requested. She has a BP cuff. Her SBP is between 100-110 bpm. Her HR is 110-120 bpm.   Advised her that if she wanted to come in, she could. She does not feel she needs to be seen tonight. Advised that she could change her mind. Call back if needed, otherwise, continue current meds and f/u in am, or Monday if doing well.  Rosaria Ferries, PA-C 12/28/2013 9:33 PM Beeper [phone]

## 2013-12-29 ENCOUNTER — Telehealth: Payer: Self-pay | Admitting: Physician Assistant

## 2013-12-29 NOTE — Telephone Encounter (Signed)
The patient called again because her HR is in the 120's.  I advised her to increase her Amiodarone to 200mg  BID for today and 200mg  daily for Sun. And Mon.  She will call the office on Monday morning to get in to see the Flex provider.   I advised her to go to the ER if she is sustaining in the 130's.  Currently she is asymptomatic.    Tanaysia Bhardwaj, PAC

## 2013-12-30 ENCOUNTER — Telehealth: Payer: Self-pay | Admitting: *Deleted

## 2013-12-30 NOTE — Telephone Encounter (Signed)
Spoke with patient regarding increased heart rates - she is improved today with increased dose of amiodarone.  Scheduled for ICM check with [facility] tomorrow and device download.  She would like to have Dr Rayann Heman review that report and make recommendations since she is symptomatically improved.

## 2013-12-31 ENCOUNTER — Encounter: Payer: Self-pay | Admitting: Internal Medicine

## 2013-12-31 ENCOUNTER — Ambulatory Visit (INDEPENDENT_AMBULATORY_CARE_PROVIDER_SITE_OTHER): Payer: Medicare Other | Admitting: *Deleted

## 2013-12-31 ENCOUNTER — Telehealth: Payer: Self-pay | Admitting: Internal Medicine

## 2013-12-31 ENCOUNTER — Encounter: Payer: Self-pay | Admitting: *Deleted

## 2013-12-31 DIAGNOSIS — Z95810 Presence of automatic (implantable) cardiac defibrillator: Secondary | ICD-10-CM

## 2013-12-31 DIAGNOSIS — I255 Ischemic cardiomyopathy: Secondary | ICD-10-CM

## 2013-12-31 MED ORDER — AMIODARONE HCL 200 MG PO TABS: 200.0000 mg | ORAL_TABLET | Freq: Every day | ORAL | Status: DC

## 2013-12-31 NOTE — Addendum Note (Signed)
Addended by: Alvis Lemmings C on: 12/31/2013 06:09 PM   Modules accepted: Level of Service

## 2013-12-31 NOTE — Progress Notes (Signed)
Remote ICD transmission.   

## 2013-12-31 NOTE — Telephone Encounter (Signed)
Dr Rayann Heman reviewed her transmission and he said to increase her Amiodarone to 200mg  daily.  She is aware and will increase to 200mg  daily.  I will call in new Rx and send staff message to Elberta Leatherwood to be aware for INR status

## 2013-12-31 NOTE — Progress Notes (Signed)
EPIC Encounter for ICM Monitoring  Patient Name: [name] is a 73 y.o. female Date: 12/31/2013 Primary Care Physican: Velna Hatchet, MD Primary Cardiologist: Stanford Breed Electrophysiologist: Allred Dry Weight: 168 lbs       In the past month, have you:  1. Gained more than 2 pounds in a day or more than 5 pounds in a week? No. She has noticed her weights around 170 lbs at times.  2. Had changes in your medications (with verification of current medications)? Yes. The patient did have trouble with elevated HR's over the weekend. Per Dr. Rayann Heman, transmission reviewed today and he feels the patient may have been in a-flutter. She has reverted back to NSR. He has increased her amiodarone to 200 mg daily as of this evening.  3. Had more shortness of breath than is usual for you? no  4. Limited your activity because of shortness of breath? no  5. Not been able to sleep because of shortness of breath? no  6. Had increased swelling in your feet or ankles? no  7. Had symptoms of dehydration (dizziness, dry mouth, increased thirst, decreased urine output) no  8. Had changes in sodium restriction? no  9. Been compliant with medication? Yes   ICM trend:   Follow-up plan: ICM clinic phone appointment: 02/04/14  Copy of note sent to patient's primary care physician, primary cardiologist, and device following physician.  Alvis Lemmings, RN, BSN 12/31/2013 6:06 PM

## 2013-12-31 NOTE — Telephone Encounter (Signed)
New message     Pt was having elevated heart rate over the weekend (113-130).  Questions about her pacerone and the dosage she is supposed to be taking.

## 2014-01-03 LAB — MDC_IDC_ENUM_SESS_TYPE_REMOTE
Battery Remaining Percentage: 83 %
Brady Statistic RA Percent Paced: 20 %
Brady Statistic RV Percent Paced: 1 % — CL
HIGH POWER IMPEDANCE MEASURED VALUE: 68 Ohm
Lead Channel Sensing Intrinsic Amplitude: 4.2 mV
Lead Channel Setting Pacing Amplitude: 2 V
Lead Channel Setting Pacing Amplitude: 2.5 V
Lead Channel Setting Pacing Pulse Width: 0.5 ms
Lead Channel Setting Sensing Sensitivity: 0.5 mV
MDC IDC MSMT LEADCHNL RA IMPEDANCE VALUE: 400 Ohm
MDC IDC MSMT LEADCHNL RV IMPEDANCE VALUE: 590 Ohm
MDC IDC MSMT LEADCHNL RV SENSING INTR AMPL: 12 mV
MDC IDC PG MODEL: 2357
MDC IDC PG SERIAL: 7080516
MDC IDC SET ZONE DETECTION INTERVAL: 250 ms
Zone Setting Detection Interval: 300 ms

## 2014-01-06 DIAGNOSIS — Z23 Encounter for immunization: Secondary | ICD-10-CM | POA: Diagnosis not present

## 2014-01-09 ENCOUNTER — Encounter: Payer: Self-pay | Admitting: Cardiology

## 2014-01-14 ENCOUNTER — Ambulatory Visit (INDEPENDENT_AMBULATORY_CARE_PROVIDER_SITE_OTHER): Payer: Medicare Other | Admitting: Pharmacist

## 2014-01-14 DIAGNOSIS — Z7901 Long term (current) use of anticoagulants: Secondary | ICD-10-CM

## 2014-01-14 DIAGNOSIS — I10 Essential (primary) hypertension: Secondary | ICD-10-CM | POA: Diagnosis not present

## 2014-01-14 DIAGNOSIS — I359 Nonrheumatic aortic valve disorder, unspecified: Secondary | ICD-10-CM | POA: Diagnosis not present

## 2014-01-14 DIAGNOSIS — I4891 Unspecified atrial fibrillation: Secondary | ICD-10-CM | POA: Diagnosis not present

## 2014-01-14 DIAGNOSIS — I480 Paroxysmal atrial fibrillation: Secondary | ICD-10-CM | POA: Diagnosis not present

## 2014-01-14 DIAGNOSIS — I059 Rheumatic mitral valve disease, unspecified: Secondary | ICD-10-CM

## 2014-01-14 DIAGNOSIS — Z1231 Encounter for screening mammogram for malignant neoplasm of breast: Secondary | ICD-10-CM | POA: Diagnosis not present

## 2014-01-14 DIAGNOSIS — E119 Type 2 diabetes mellitus without complications: Secondary | ICD-10-CM | POA: Diagnosis not present

## 2014-01-14 DIAGNOSIS — E559 Vitamin D deficiency, unspecified: Secondary | ICD-10-CM | POA: Diagnosis not present

## 2014-01-14 DIAGNOSIS — Z6830 Body mass index (BMI) 30.0-30.9, adult: Secondary | ICD-10-CM | POA: Diagnosis not present

## 2014-01-14 LAB — POCT INR: INR: 3.4

## 2014-01-21 DIAGNOSIS — R310 Gross hematuria: Secondary | ICD-10-CM | POA: Diagnosis not present

## 2014-01-21 DIAGNOSIS — N200 Calculus of kidney: Secondary | ICD-10-CM | POA: Diagnosis not present

## 2014-01-25 ENCOUNTER — Encounter: Payer: Self-pay | Admitting: Cardiology

## 2014-01-28 ENCOUNTER — Ambulatory Visit (INDEPENDENT_AMBULATORY_CARE_PROVIDER_SITE_OTHER): Payer: Medicare Other

## 2014-01-28 DIAGNOSIS — I059 Rheumatic mitral valve disease, unspecified: Secondary | ICD-10-CM

## 2014-01-28 DIAGNOSIS — I359 Nonrheumatic aortic valve disorder, unspecified: Secondary | ICD-10-CM

## 2014-01-28 DIAGNOSIS — I4891 Unspecified atrial fibrillation: Secondary | ICD-10-CM | POA: Diagnosis not present

## 2014-01-28 DIAGNOSIS — Z7901 Long term (current) use of anticoagulants: Secondary | ICD-10-CM | POA: Diagnosis not present

## 2014-01-28 LAB — POCT INR: INR: 3.1

## 2014-02-04 ENCOUNTER — Ambulatory Visit (INDEPENDENT_AMBULATORY_CARE_PROVIDER_SITE_OTHER): Payer: Medicare Other | Admitting: *Deleted

## 2014-02-04 DIAGNOSIS — I255 Ischemic cardiomyopathy: Secondary | ICD-10-CM | POA: Diagnosis not present

## 2014-02-04 DIAGNOSIS — Z95810 Presence of automatic (implantable) cardiac defibrillator: Secondary | ICD-10-CM

## 2014-02-07 ENCOUNTER — Telehealth: Payer: Self-pay | Admitting: *Deleted

## 2014-02-07 NOTE — Telephone Encounter (Signed)
ICM transmission received. I left a message for the patient to call. 

## 2014-02-08 ENCOUNTER — Encounter: Payer: Self-pay | Admitting: *Deleted

## 2014-02-08 NOTE — Telephone Encounter (Signed)
I spoke with the patient. 

## 2014-02-08 NOTE — Telephone Encounter (Signed)
Follow up  Pt returning phone call for [name]; Pt stated she will be at [facility]7 @ 2:15 if needed then. Please call back and discuss.

## 2014-02-08 NOTE — Progress Notes (Signed)
EPIC Encounter for ICM Monitoring  Patient Name: [name] is a 73 y.o. female Date: 02/08/2014 Primary Care Physican: Velna Hatchet, MD Primary Cardiologist: Stanford Breed Electrophysiologist: Allred Dry Weight: 171 lbs      In the past month, have you:  1. Gained more than 2 pounds in a day or more than 5 pounds in a week? No. The patient's baseline weight has steadily increased from 168 lbs to 171 lbs. The patient states she was eating better in the summer with the fresh vegetables coming in. Now she is eating differently and her taste has been down, but is coming back.  2. Had changes in your medications (with verification of current medications)? no  3. Had more shortness of breath than is usual for you? no  4. Limited your activity because of shortness of breath? no  5. Not been able to sleep because of shortness of breath? no  6. Had increased swelling in your feet or ankles? no  7. Had symptoms of dehydration (dizziness, dry mouth, increased thirst, decreased urine output) no  8. Had changes in sodium restriction? no  9. Been compliant with medication? Yes   ICM trend:   Follow-up plan: ICM clinic phone appointment: 03/14/14. The patient's corvue readings were slightly elevated from ~ 11/10-11/17. She noticed no change in her symptoms at that time. No changes made today.  Copy of note sent to patient's primary care physician, primary cardiologist, and device following physician.  Alvis Lemmings, RN, BSN 02/08/2014 4:01 PM

## 2014-02-11 ENCOUNTER — Ambulatory Visit (INDEPENDENT_AMBULATORY_CARE_PROVIDER_SITE_OTHER): Payer: Medicare Other | Admitting: *Deleted

## 2014-02-11 DIAGNOSIS — I059 Rheumatic mitral valve disease, unspecified: Secondary | ICD-10-CM | POA: Diagnosis not present

## 2014-02-11 DIAGNOSIS — I359 Nonrheumatic aortic valve disorder, unspecified: Secondary | ICD-10-CM | POA: Diagnosis not present

## 2014-02-11 DIAGNOSIS — I4891 Unspecified atrial fibrillation: Secondary | ICD-10-CM | POA: Diagnosis not present

## 2014-02-11 DIAGNOSIS — Z7901 Long term (current) use of anticoagulants: Secondary | ICD-10-CM

## 2014-02-11 LAB — POCT INR: INR: 3.2

## 2014-02-14 ENCOUNTER — Encounter (HOSPITAL_COMMUNITY): Payer: Self-pay | Admitting: Cardiovascular Disease

## 2014-02-25 ENCOUNTER — Ambulatory Visit (INDEPENDENT_AMBULATORY_CARE_PROVIDER_SITE_OTHER): Payer: Medicare Other | Admitting: Pharmacist

## 2014-02-25 DIAGNOSIS — I059 Rheumatic mitral valve disease, unspecified: Secondary | ICD-10-CM | POA: Diagnosis not present

## 2014-02-25 DIAGNOSIS — Z7901 Long term (current) use of anticoagulants: Secondary | ICD-10-CM

## 2014-02-25 DIAGNOSIS — I359 Nonrheumatic aortic valve disorder, unspecified: Secondary | ICD-10-CM

## 2014-02-25 DIAGNOSIS — I4891 Unspecified atrial fibrillation: Secondary | ICD-10-CM | POA: Diagnosis not present

## 2014-02-25 LAB — POCT INR: INR: 2.8

## 2014-02-26 ENCOUNTER — Other Ambulatory Visit: Payer: Self-pay | Admitting: Cardiology

## 2014-02-27 ENCOUNTER — Telehealth: Payer: Self-pay | Admitting: Cardiology

## 2014-02-27 MED ORDER — LOSARTAN POTASSIUM 50 MG PO TABS: 50.0000 mg | ORAL_TABLET | Freq: Every day | ORAL | Status: DC

## 2014-02-27 NOTE — Telephone Encounter (Signed)
Rx(s) sent to pharmacy electronically.  

## 2014-02-27 NOTE — Telephone Encounter (Signed)
Pt called in stating that her prescription for Losartan was filled for 30 days instead of 90. She would like to know if Hilda Blades could changed that for her. She has not picked up the 30 day prescription. Please call  Thanks

## 2014-03-14 ENCOUNTER — Encounter: Payer: Self-pay | Admitting: *Deleted

## 2014-03-14 ENCOUNTER — Other Ambulatory Visit: Payer: Self-pay | Admitting: *Deleted

## 2014-03-14 ENCOUNTER — Ambulatory Visit (INDEPENDENT_AMBULATORY_CARE_PROVIDER_SITE_OTHER): Payer: Medicare Other | Admitting: *Deleted

## 2014-03-14 ENCOUNTER — Telehealth: Payer: Self-pay | Admitting: Cardiology

## 2014-03-14 DIAGNOSIS — I255 Ischemic cardiomyopathy: Secondary | ICD-10-CM | POA: Diagnosis not present

## 2014-03-14 DIAGNOSIS — Z95810 Presence of automatic (implantable) cardiac defibrillator: Secondary | ICD-10-CM | POA: Diagnosis not present

## 2014-03-14 MED ORDER — CARVEDILOL 12.5 MG PO TABS: 12.5000 mg | ORAL_TABLET | Freq: Two times a day (BID) | ORAL | Status: DC

## 2014-03-14 NOTE — Telephone Encounter (Signed)
Pt need new prescription for her Carvedilol #180.Please call to [facility].Please call this in today if possible.

## 2014-03-14 NOTE — Progress Notes (Signed)
EPIC Encounter for ICM Monitoring  Patient Name: [name] is a 74 y.o. female Date: 03/14/2014 Primary Care Physican: Velna Hatchet, MD Primary Cardiologist: Stanford Breed Electrophysiologist: Allred Dry Weight: 174 lbs       In the past month, have you:  1. Gained more than 2 pounds in a day or more than 5 pounds in a week? No. The patient states her weight continues to progressively increase as her taste has improved and adjusted to the amiodarone. Her activity level has been down with the cooler months.  2. Had changes in your medications (with verification of current medications)? no  3. Had more shortness of breath than is usual for you? no  4. Limited your activity because of shortness of breath? no  5. Not been able to sleep because of shortness of breath? no  6. Had increased swelling in your feet or ankles? no  7. Had symptoms of dehydration (dizziness, dry mouth, increased thirst, decreased urine output) no  8. Had changes in sodium restriction? no  9. Been compliant with medication? Yes   ICM trend:    Follow-up plan: ICM clinic phone appointment: 04/15/14  Copy of note sent to patient's primary care physician, primary cardiologist, and device following physician.  Alvis Lemmings, RN, BSN 03/14/2014 3:22 PM

## 2014-03-18 ENCOUNTER — Ambulatory Visit (INDEPENDENT_AMBULATORY_CARE_PROVIDER_SITE_OTHER): Payer: Medicare Other

## 2014-03-18 DIAGNOSIS — I4891 Unspecified atrial fibrillation: Secondary | ICD-10-CM

## 2014-03-18 DIAGNOSIS — Z7901 Long term (current) use of anticoagulants: Secondary | ICD-10-CM

## 2014-03-18 DIAGNOSIS — I059 Rheumatic mitral valve disease, unspecified: Secondary | ICD-10-CM | POA: Diagnosis not present

## 2014-03-18 DIAGNOSIS — I359 Nonrheumatic aortic valve disorder, unspecified: Secondary | ICD-10-CM | POA: Diagnosis not present

## 2014-03-18 LAB — POCT INR: INR: 2.6

## 2014-03-22 NOTE — Progress Notes (Signed)
HPI: FU PAF, AVR, CM, H/O cardiac arrest s/p ICD.  History of severe AS, mitral regurgitation, aneurysm of the ascending thoracic aorta (status post aortic valve replacement, mitral valve repair, resection and grafting of the ascending thoracic aortic aneurysm on Jul 07, 2006), history of paroxysmal atrial fibrillation (status post maze procedure on Jul 07, 2006). Followup catheterization performed secondary to elevated troponin and recurrent atrial fibrillation revealed an ejection fraction of 25-30% and occlusion of the reimplanted left main. The patient subsequently underwent redo coronary artery bypass and graft with a LIMA to the LAD and a saphenous vein graft to the OM. She also had an epicardial lead placed if she required biventricular pacing in the future. Cardiac catheterization repeated in January of 2014 because of reduced LV function. The left main was occluded. The LAD, circumflex and right coronary artery were patent. The LIMA to the LAD was patent but atretic. Saphenous vein graft to the obtuse marginal was patent. This graft filled the obtuse marginal and ramus intermedius and the entire LAD filled retrograde from this graft. Ejection fraction was 35%. Patient referred to Dr. Rayann Heman for consideration of ICD; also placed on tikosyn for atrial fibrillation. Patient then admitted in March of 2014 following a ventricular fibrillation arrest. She required a prolonged resuscitation. Her QT was prolonged and her tikosyn DCed. Her hospital course was complicated by a DVT in her left arm. She ultimately had a [employer] ICD implanted. Amiodarone initiated. Echocardiogram repeated February 2015 and showed an ejection fraction of [id number], grade 2 diastolic dysfunction, bioprosthetic aortic valve with mild aortic insufficiency, mild left atrial enlargement, moderate right ventricular enlargement with reduced function. Since I last saw her, she denies dyspnea, chest pain, palpitations or  syncope.  Current Outpatient Prescriptions  Medication Sig Dispense Refill  . amiodarone (PACERONE) 200 MG tablet Take 1 tablet (200 mg total) by mouth daily. 90 tablet 3  . carvedilol (COREG) 12.5 MG tablet Take 1 tablet (12.5 mg total) by mouth 2 (two) times daily with a meal. 180 tablet 3  . Cyanocobalamin (VITAMIN B-12) 2500 MCG SUBL Place 1 tablet under the tongue 3 (three) times a week. Dissolves 1 tablet under the tongue on Monday, Wednesday, and Friday    . denosumab (PROLIA) 60 MG/ML SOLN injection Inject 60 mg into the skin every 6 (six) months. Administer in upper arm, thigh, or abdomen    . ergocalciferol (VITAMIN D2) 50000 UNITS capsule Take 50,000 Units by mouth once a week.    . furosemide (LASIX) 20 MG tablet TAKE 1 TABLET (20 MG TOTAL) BY MOUTH DAILY. 90 tablet 3  . linagliptin (TRADJENTA) 5 MG TABS tablet Take 1 tablet (5 mg total) by mouth daily. 30 tablet   . losartan (COZAAR) 50 MG tablet Take 1 tablet (50 mg total) by mouth daily. Pt must schedule appt for further refills 90 tablet 0  . omeprazole (PRILOSEC OTC) 20 MG tablet Take 20 mg by mouth daily before breakfast.     . Oyster Shell (OYSTER CALCIUM) 500 MG TABS tablet Take 500 mg of elemental calcium by mouth at bedtime.    . Psyllium (METAMUCIL PO) 1 teaspoon daily with water    . warfarin (COUMADIN) 3 MG tablet Take 0.5-1 tablets (1.5-3 mg total) by mouth daily. 30 tablet 3   No current facility-administered medications for this visit.     Past Medical History  Diagnosis Date  . Anemia   . Atrial fibrillation     A.fib/flutter: s/p  MAZE 2008, [place] 2011, on coumadin; previously on flecainide, but dc'd due to worsening EF. Tikosyn discontinued 06/2012 after cardiac arrest.  . Diverticulitis   . CVA (cerebral vascular accident)     2008 - felt due to oscillating calcium on aortic valve  . Carcinoma in situ of vulva   . Fibroid   . Ovarian cyst, left 2008-2009  . Osteoporosis   . Diabetes mellitus   .  Hypertension   . GERD (gastroesophageal reflux disease)   . Chronic systolic CHF (congestive heart failure)     EF 35%  . Ischemic cardiomyopathy     EF 35%  . Coronary artery disease     Occluded LM 2/2 previous aortic root surgery; s/p 2v CABG 2010 LIMA to LAD, SVG to OM; Cath 03/11/12 patent SVG to OM, atretic LIMA to LAD, patent RCA, occluded native LM, EF 35%   . Arthritis     OSTEO  . Fracture of lumbar spine   . 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  . Superficial venous thrombosis of upper extremity     06/2012 - LUE  . Severe aortic stenosis     s/p homograft aortic root replacement 2008  . Mitral regurgitation     s/p mitral valve repair 2008  . Thoracic aortic aneurysm     s/p resection and grafting 2008  . Stroke   . Kidney stones     Past Surgical History  Procedure Laterality Date  . Foot surgery Left     removed neuroma  . Aortic root replacement  2008    homograft  . Mitral valve replacement  2008    due to severe MR  . Resection and grafting of ascending thoracic aortic    . Aneurysm and left sided maze  2008  . Coronary artery bypass graft  4/10    LIMA to LAD, SVG to OM  . Wide excision of vulva      CA INSITU  . Cardiac defibrillator placement  06/08/2012    [address] [place] [address]  implanted by Dr Rayann Heman following a cardiac arrest  . Right heart catheterization N/A 04/03/2012    Procedure: RIGHT HEART CATH;  Surgeon: [name], MD;  Location: [facility] CATH LAB;  Service: Cardiovascular;  Laterality: N/A;  . Implantable cardioverter defibrillator implant N/A 06/08/2012    Procedure: IMPLANTABLE CARDIOVERTER DEFIBRILLATOR IMPLANT;  Surgeon: Thompson Grayer, MD;  Location: [facility] CATH LAB;  Service: Cardiovascular;  Laterality: N/A;    History   Social History  . Marital Status: Married    Spouse  Name: N/A    Number of Children: 0  . Years of Education: N/A   Occupational History  . Retired Radiation protection practitioner    Social History Main Topics  . Smoking status: Former Smoker    Types: Cigarettes    Quit date: 03/09/1983  . Smokeless tobacco: Never Used  . Alcohol Use: No  . Drug Use: No  . Sexual Activity: Not Currently    Birth Control/ Protection: Post-menopausal   Other Topics Concern  . Not on file   Social History Narrative   Her mother died at age 73 from a stroke.  Her father died at age 69, had chronic obstructive pulmonary disease and colon disease.   Regular exercise: was in cardiac rehab   Caffeine use: none    ROS: no fevers  or chills, productive cough, hemoptysis, dysphasia, odynophagia, melena, hematochezia, dysuria, hematuria, rash, seizure activity, orthopnea, PND, pedal edema, claudication. Remaining systems are negative.  Physical Exam: Well-developed well-nourished in no acute distress.  Skin is warm and dry.  HEENT is normal.  Neck is supple.  Chest is clear to auscultation with normal expansion.  Cardiovascular exam is regular rate and rhythm. 2/6 systolic murmur left sternal border. Abdominal exam nontender or distended. No masses palpated. Extremities show no edema. neuro grossly intact  ECG atrial paced rhythm, septal infarct, left axis deviation.

## 2014-03-25 ENCOUNTER — Encounter: Payer: Self-pay | Admitting: Cardiology

## 2014-03-25 ENCOUNTER — Ambulatory Visit (INDEPENDENT_AMBULATORY_CARE_PROVIDER_SITE_OTHER): Payer: Medicare Other | Admitting: Cardiology

## 2014-03-25 ENCOUNTER — Other Ambulatory Visit: Payer: Self-pay | Admitting: *Deleted

## 2014-03-25 VITALS — BP 128/76 | HR 60 | Ht 63.0 in | Wt 177.0 lb

## 2014-03-25 DIAGNOSIS — Z95810 Presence of automatic (implantable) cardiac defibrillator: Secondary | ICD-10-CM | POA: Diagnosis not present

## 2014-03-25 DIAGNOSIS — I255 Ischemic cardiomyopathy: Secondary | ICD-10-CM | POA: Diagnosis not present

## 2014-03-25 DIAGNOSIS — E785 Hyperlipidemia, unspecified: Secondary | ICD-10-CM

## 2014-03-25 DIAGNOSIS — I10 Essential (primary) hypertension: Secondary | ICD-10-CM

## 2014-03-25 DIAGNOSIS — I4891 Unspecified atrial fibrillation: Secondary | ICD-10-CM | POA: Diagnosis not present

## 2014-03-25 DIAGNOSIS — Z9889 Other specified postprocedural states: Secondary | ICD-10-CM | POA: Diagnosis not present

## 2014-03-25 MED ORDER — WARFARIN SODIUM 3 MG PO TABS: 1.5000 mg | ORAL_TABLET | Freq: Every day | ORAL | Status: DC

## 2014-03-25 NOTE — Assessment & Plan Note (Signed)
Continue SBE prophylaxis. 

## 2014-03-25 NOTE — Assessment & Plan Note (Signed)
Continue present blood pressure medications.check potassium and renal function.

## 2014-03-25 NOTE — Patient Instructions (Signed)
Your physician wants you to follow-up in: October [place] will receive a reminder letter in the mail two months in advance. If you don't receive a letter, please call our office to schedule the follow-up appointment.   Your physician recommends that you return for lab work WITH NEXT PROTIME  A chest x-ray takes a picture of the organs and structures inside the chest, including the heart, lungs, and blood vessels. This test can show several things, including, whether the heart is enlarges; whether fluid is building up in the lungs; and whether pacemaker / defibrillator leads are still in place.AT [place] [facility]=[address]=GO TO THE BASEMENT

## 2014-03-25 NOTE — Assessment & Plan Note (Signed)
Patient in atrial paced rhythm today. Continue amiodarone. Check TSH, liver functions and chest x-ray. Check hemoglobin. Continue Coumadin.

## 2014-03-25 NOTE — Assessment & Plan Note (Signed)
Follow-up electrophysiology. 

## 2014-03-25 NOTE — Assessment & Plan Note (Signed)
Continue ARB and beta blocker. 

## 2014-03-25 NOTE — Assessment & Plan Note (Signed)
Check cholesterol.

## 2014-03-27 ENCOUNTER — Ambulatory Visit (INDEPENDENT_AMBULATORY_CARE_PROVIDER_SITE_OTHER)
Admission: RE | Admit: 2014-03-27 | Discharge: 2014-03-27 | Disposition: A | Discharge status: Home / Self Care | Payer: Medicare Other | Source: Ambulatory Visit | Attending: Cardiology | Admitting: Cardiology

## 2014-03-27 DIAGNOSIS — E785 Hyperlipidemia, unspecified: Secondary | ICD-10-CM

## 2014-03-27 DIAGNOSIS — I4891 Unspecified atrial fibrillation: Secondary | ICD-10-CM | POA: Diagnosis not present

## 2014-03-27 DIAGNOSIS — Z79899 Other long term (current) drug therapy: Secondary | ICD-10-CM | POA: Diagnosis not present

## 2014-04-04 ENCOUNTER — Other Ambulatory Visit (INDEPENDENT_AMBULATORY_CARE_PROVIDER_SITE_OTHER): Payer: Medicare Other | Admitting: *Deleted

## 2014-04-04 DIAGNOSIS — E785 Hyperlipidemia, unspecified: Secondary | ICD-10-CM | POA: Diagnosis not present

## 2014-04-04 DIAGNOSIS — I4891 Unspecified atrial fibrillation: Secondary | ICD-10-CM

## 2014-04-04 LAB — BASIC METABOLIC PANEL
BUN: 21 mg/dL (ref 6–23)
CO2: 23 meq/L (ref 19–32)
Calcium: 9.1 mg/dL (ref 8.4–10.5)
Chloride: 101 mEq/L (ref 96–112)
Creatinine, Ser: 1.49 mg/dL — ABNORMAL HIGH (ref 0.40–1.20)
GFR: 36.47 mL/min — AB (ref >60.00)
Glucose, Bld: 120 mg/dL — ABNORMAL HIGH (ref 70–99)
Potassium: 4.2 mEq/L (ref 3.5–5.1)
Sodium: 137 mEq/L (ref 135–145)

## 2014-04-04 LAB — CBC WITH DIFFERENTIAL/PLATELET
BASOS ABS: 0.1 10*3/uL (ref 0.0–0.1)
Basophils Relative: 1.1 % (ref 0.0–3.0)
Eosinophils Absolute: 0.2 10*3/uL (ref 0.0–0.7)
Eosinophils Relative: 2 % (ref 0.0–5.0)
HEMATOCRIT: 38.3 % (ref 36.0–46.0)
HEMOGLOBIN: 12.9 g/dL (ref 12.0–15.0)
Lymphocytes Relative: 7 % — ABNORMAL LOW (ref 12.0–46.0)
Lymphs Abs: 0.6 10*3/uL — ABNORMAL LOW (ref 0.7–4.0)
MCHC: 33.5 g/dL (ref 30.0–36.0)
MCV: 90.4 fl (ref 78.0–100.0)
Monocytes Absolute: 0.4 10*3/uL (ref 0.1–1.0)
Monocytes Relative: 4.9 % (ref 3.0–12.0)
Neutro Abs: 7.6 10*3/uL (ref 1.4–7.7)
Neutrophils Relative %: 85 % — ABNORMAL HIGH (ref 43.0–77.0)
Platelets: 311 10*3/uL (ref 150.0–400.0)
RBC: 4.24 Mil/uL (ref 3.87–5.11)
RDW: 13.3 % (ref 11.5–15.5)
WBC: 9 10*3/uL (ref 4.0–10.5)

## 2014-04-04 LAB — LIPID PANEL
Cholesterol: 169 mg/dL (ref 0–200)
HDL: 59.5 mg/dL (ref >39.00)
LDL Cholesterol: 81 mg/dL (ref 0–99)
NonHDL: 109.5
Total CHOL/HDL Ratio: 3
Triglycerides: 141 mg/dL (ref 0.0–149.0)
VLDL: 28.2 mg/dL (ref 0.0–40.0)

## 2014-04-04 LAB — HEPATIC FUNCTION PANEL
ALBUMIN: 4.1 g/dL (ref 3.5–5.2)
ALT: 11 U/L (ref 0–35)
AST: 17 U/L (ref 0–37)
Alkaline Phosphatase: 62 U/L (ref 39–117)
Bilirubin, Direct: 0.2 mg/dL (ref 0.0–0.3)
Total Bilirubin: 0.6 mg/dL (ref 0.2–1.2)
Total Protein: 7.2 g/dL (ref 6.0–8.3)

## 2014-04-04 LAB — TSH: TSH: 4.46 u[IU]/mL (ref 0.35–4.50)

## 2014-04-08 DIAGNOSIS — E119 Type 2 diabetes mellitus without complications: Secondary | ICD-10-CM | POA: Diagnosis not present

## 2014-04-08 DIAGNOSIS — E559 Vitamin D deficiency, unspecified: Secondary | ICD-10-CM | POA: Diagnosis not present

## 2014-04-08 DIAGNOSIS — M859 Disorder of bone density and structure, unspecified: Secondary | ICD-10-CM | POA: Diagnosis not present

## 2014-04-08 DIAGNOSIS — I10 Essential (primary) hypertension: Secondary | ICD-10-CM | POA: Diagnosis not present

## 2014-04-08 DIAGNOSIS — R358 Other polyuria: Secondary | ICD-10-CM | POA: Diagnosis not present

## 2014-04-15 ENCOUNTER — Encounter: Payer: Self-pay | Admitting: *Deleted

## 2014-04-15 ENCOUNTER — Ambulatory Visit (INDEPENDENT_AMBULATORY_CARE_PROVIDER_SITE_OTHER): Payer: Medicare Other | Admitting: *Deleted

## 2014-04-15 ENCOUNTER — Encounter: Payer: Self-pay | Admitting: Internal Medicine

## 2014-04-15 ENCOUNTER — Other Ambulatory Visit: Payer: Medicare Other

## 2014-04-15 ENCOUNTER — Telehealth: Payer: Self-pay | Admitting: *Deleted

## 2014-04-15 ENCOUNTER — Other Ambulatory Visit: Payer: Self-pay | Admitting: Internal Medicine

## 2014-04-15 DIAGNOSIS — I480 Paroxysmal atrial fibrillation: Secondary | ICD-10-CM | POA: Diagnosis not present

## 2014-04-15 DIAGNOSIS — I4891 Unspecified atrial fibrillation: Secondary | ICD-10-CM

## 2014-04-15 DIAGNOSIS — E1121 Type 2 diabetes mellitus with diabetic nephropathy: Secondary | ICD-10-CM | POA: Diagnosis not present

## 2014-04-15 DIAGNOSIS — Z95810 Presence of automatic (implantable) cardiac defibrillator: Secondary | ICD-10-CM | POA: Diagnosis not present

## 2014-04-15 DIAGNOSIS — Z0000 Encounter for general adult medical examination without abnormal findings: Secondary | ICD-10-CM | POA: Diagnosis not present

## 2014-04-15 DIAGNOSIS — I10 Essential (primary) hypertension: Secondary | ICD-10-CM | POA: Diagnosis not present

## 2014-04-15 DIAGNOSIS — E119 Type 2 diabetes mellitus without complications: Secondary | ICD-10-CM | POA: Diagnosis not present

## 2014-04-15 DIAGNOSIS — I255 Ischemic cardiomyopathy: Secondary | ICD-10-CM

## 2014-04-15 DIAGNOSIS — I359 Nonrheumatic aortic valve disorder, unspecified: Secondary | ICD-10-CM

## 2014-04-15 DIAGNOSIS — I509 Heart failure, unspecified: Secondary | ICD-10-CM | POA: Diagnosis not present

## 2014-04-15 DIAGNOSIS — Z6830 Body mass index (BMI) 30.0-30.9, adult: Secondary | ICD-10-CM | POA: Diagnosis not present

## 2014-04-15 DIAGNOSIS — Z7901 Long term (current) use of anticoagulants: Secondary | ICD-10-CM

## 2014-04-15 DIAGNOSIS — N183 Chronic kidney disease, stage 3 (moderate): Secondary | ICD-10-CM | POA: Diagnosis not present

## 2014-04-15 DIAGNOSIS — I059 Rheumatic mitral valve disease, unspecified: Secondary | ICD-10-CM

## 2014-04-15 DIAGNOSIS — E559 Vitamin D deficiency, unspecified: Secondary | ICD-10-CM | POA: Diagnosis not present

## 2014-04-15 DIAGNOSIS — K219 Gastro-esophageal reflux disease without esophagitis: Secondary | ICD-10-CM | POA: Diagnosis not present

## 2014-04-15 DIAGNOSIS — M810 Age-related osteoporosis without current pathological fracture: Secondary | ICD-10-CM | POA: Diagnosis not present

## 2014-04-15 DIAGNOSIS — D509 Iron deficiency anemia, unspecified: Secondary | ICD-10-CM | POA: Diagnosis not present

## 2014-04-15 DIAGNOSIS — Z1389 Encounter for screening for other disorder: Secondary | ICD-10-CM | POA: Diagnosis not present

## 2014-04-15 LAB — POCT INR: INR: 2.9

## 2014-04-15 NOTE — Telephone Encounter (Signed)
ICM transmission received. I left a message for the patient to call. 

## 2014-04-15 NOTE — Progress Notes (Signed)
Remote ICD transmission.   

## 2014-04-15 NOTE — Progress Notes (Signed)
EPIC Encounter for ICM Monitoring  Patient Name: [name] is a 74 y.o. female Date: 04/15/2014 Primary Care Physican: Velna Hatchet, MD Primary Cardiologist: Stanford Breed Electrophysiologist: Allred Dry Weight: 174 lbs       In the past month, have you:  1. Gained more than 2 pounds in a day or more than 5 pounds in a week? no  2. Had changes in your medications (with verification of current medications)? no  3. Had more shortness of breath than is usual for you? no  4. Limited your activity because of shortness of breath? no  5. Not been able to sleep because of shortness of breath? no  6. Had increased swelling in your feet or ankles? no  7. Had symptoms of dehydration (dizziness, dry mouth, increased thirst, decreased urine output) no  8. Had changes in sodium restriction? no  9. Been compliant with medication? Yes   ICM trend:   Follow-up plan: ICM clinic phone appointment: 05/16/14  Copy of note sent to patient's primary care physician, primary cardiologist, and device following physician.  Alvis Lemmings, RN, BSN 04/15/2014 12:23 PM

## 2014-04-15 NOTE — Telephone Encounter (Signed)
I spoke with the patient. 

## 2014-04-16 ENCOUNTER — Other Ambulatory Visit: Payer: Self-pay | Admitting: Cardiology

## 2014-04-17 LAB — MDC_IDC_ENUM_SESS_TYPE_REMOTE
Battery Remaining Longevity: 79 mo
Battery Remaining Percentage: 80 %
Battery Voltage: 3.02 V
Brady Statistic AP VS Percent: 27 %
Brady Statistic AS VP Percent: 1 %
Brady Statistic RV Percent Paced: 1 %
Date Time Interrogation Session: 20160208070019
HIGH POWER IMPEDANCE MEASURED VALUE: 79 Ohm
HighPow Impedance: 79 Ohm
Implantable Pulse Generator Serial Number: 7080516
Lead Channel Impedance Value: 430 Ohm
Lead Channel Impedance Value: 650 Ohm
Lead Channel Pacing Threshold Amplitude: 0.75 V
Lead Channel Pacing Threshold Amplitude: 0.75 V
Lead Channel Pacing Threshold Pulse Width: 0.5 ms
Lead Channel Sensing Intrinsic Amplitude: 12 mV
Lead Channel Sensing Intrinsic Amplitude: 4.4 mV
Lead Channel Setting Pacing Pulse Width: 0.5 ms
Lead Channel Setting Sensing Sensitivity: 0.5 mV
MDC IDC MSMT LEADCHNL RV PACING THRESHOLD PULSEWIDTH: 0.5 ms
MDC IDC SET LEADCHNL RA PACING AMPLITUDE: 2 V
MDC IDC SET LEADCHNL RV PACING AMPLITUDE: 2.5 V
MDC IDC SET ZONE DETECTION INTERVAL: 300 ms
MDC IDC STAT BRADY AP VP PERCENT: 1 %
MDC IDC STAT BRADY AS VS PERCENT: 73 %
MDC IDC STAT BRADY RA PERCENT PACED: 26 %
Zone Setting Detection Interval: 250 ms

## 2014-04-24 DIAGNOSIS — E559 Vitamin D deficiency, unspecified: Secondary | ICD-10-CM | POA: Diagnosis not present

## 2014-04-24 DIAGNOSIS — M25532 Pain in left wrist: Secondary | ICD-10-CM | POA: Diagnosis not present

## 2014-04-24 DIAGNOSIS — M810 Age-related osteoporosis without current pathological fracture: Secondary | ICD-10-CM | POA: Diagnosis not present

## 2014-05-13 ENCOUNTER — Ambulatory Visit (INDEPENDENT_AMBULATORY_CARE_PROVIDER_SITE_OTHER): Payer: Medicare Other | Admitting: Pharmacist

## 2014-05-13 DIAGNOSIS — I359 Nonrheumatic aortic valve disorder, unspecified: Secondary | ICD-10-CM

## 2014-05-13 DIAGNOSIS — I059 Rheumatic mitral valve disease, unspecified: Secondary | ICD-10-CM | POA: Diagnosis not present

## 2014-05-13 DIAGNOSIS — I4891 Unspecified atrial fibrillation: Secondary | ICD-10-CM

## 2014-05-13 DIAGNOSIS — Z7901 Long term (current) use of anticoagulants: Secondary | ICD-10-CM

## 2014-05-13 LAB — POCT INR: INR: 3.2

## 2014-05-20 ENCOUNTER — Ambulatory Visit (INDEPENDENT_AMBULATORY_CARE_PROVIDER_SITE_OTHER): Payer: Medicare Other | Admitting: *Deleted

## 2014-05-20 DIAGNOSIS — Z95810 Presence of automatic (implantable) cardiac defibrillator: Secondary | ICD-10-CM

## 2014-05-21 ENCOUNTER — Telehealth: Payer: Self-pay | Admitting: *Deleted

## 2014-05-21 ENCOUNTER — Encounter: Payer: Self-pay | Admitting: *Deleted

## 2014-05-21 NOTE — Telephone Encounter (Signed)
ICM transmission received. I left a message for the patient to call. 

## 2014-05-21 NOTE — Progress Notes (Signed)
EPIC Encounter for ICM Monitoring  Patient Name: [name] is a 74 y.o. female Date: 05/21/2014 Primary Care Physican: Velna Hatchet, MD Primary Cardiologist: Stanford Breed Electrophysiologist: Allred Dry Weight: 174 lbs       In the past month, have you:  1. Gained more than 2 pounds in a day or more than 5 pounds in a week? no  2. Had changes in your medications (with verification of current medications)? no  3. Had more shortness of breath than is usual for you? no  4. Limited your activity because of shortness of breath? no  5. Not been able to sleep because of shortness of breath? no  6. Had increased swelling in your feet or ankles? no  7. Had symptoms of dehydration (dizziness, dry mouth, increased thirst, decreased urine output) no  8. Had changes in sodium restriction? no  9. Been compliant with medication? Yes   ICM trend:   Follow-up plan: ICM clinic phone appointment: 06/24/14. The patient's impedence is reading below baseline from ~ 2/3-2/17. She has had no change in her symptoms. No changes made today. She has a recall in for Dr. Rayann Heman for April, but just saw Dr. Stanford Breed in January. She would like to wait until July to follow up with Dr. Rayann Heman.   Copy of note sent to patient's primary care physician, primary cardiologist, and device following physician.  Alvis Lemmings, RN, BSN 05/21/2014 5:32 PM

## 2014-05-21 NOTE — Telephone Encounter (Signed)
I spoke with the patient. 

## 2014-05-22 ENCOUNTER — Other Ambulatory Visit: Payer: Self-pay | Admitting: Cardiology

## 2014-06-02 ENCOUNTER — Other Ambulatory Visit: Payer: Self-pay | Admitting: Cardiology

## 2014-06-03 ENCOUNTER — Ambulatory Visit (INDEPENDENT_AMBULATORY_CARE_PROVIDER_SITE_OTHER): Payer: Medicare Other | Admitting: *Deleted

## 2014-06-03 DIAGNOSIS — I059 Rheumatic mitral valve disease, unspecified: Secondary | ICD-10-CM | POA: Diagnosis not present

## 2014-06-03 DIAGNOSIS — I4891 Unspecified atrial fibrillation: Secondary | ICD-10-CM

## 2014-06-03 DIAGNOSIS — I359 Nonrheumatic aortic valve disorder, unspecified: Secondary | ICD-10-CM

## 2014-06-03 DIAGNOSIS — Z7901 Long term (current) use of anticoagulants: Secondary | ICD-10-CM

## 2014-06-03 LAB — POCT INR: INR: 3.4

## 2014-06-17 ENCOUNTER — Ambulatory Visit (INDEPENDENT_AMBULATORY_CARE_PROVIDER_SITE_OTHER): Payer: Medicare Other | Admitting: *Deleted

## 2014-06-17 DIAGNOSIS — I359 Nonrheumatic aortic valve disorder, unspecified: Secondary | ICD-10-CM | POA: Diagnosis not present

## 2014-06-17 DIAGNOSIS — I059 Rheumatic mitral valve disease, unspecified: Secondary | ICD-10-CM | POA: Diagnosis not present

## 2014-06-17 DIAGNOSIS — I4891 Unspecified atrial fibrillation: Secondary | ICD-10-CM

## 2014-06-17 DIAGNOSIS — Z7901 Long term (current) use of anticoagulants: Secondary | ICD-10-CM

## 2014-06-17 LAB — POCT INR: INR: 2.5

## 2014-06-24 ENCOUNTER — Ambulatory Visit (INDEPENDENT_AMBULATORY_CARE_PROVIDER_SITE_OTHER): Payer: Medicare Other | Admitting: *Deleted

## 2014-06-24 ENCOUNTER — Encounter: Payer: Self-pay | Admitting: *Deleted

## 2014-06-24 DIAGNOSIS — I255 Ischemic cardiomyopathy: Secondary | ICD-10-CM | POA: Diagnosis not present

## 2014-06-24 DIAGNOSIS — Z95810 Presence of automatic (implantable) cardiac defibrillator: Secondary | ICD-10-CM | POA: Diagnosis not present

## 2014-06-24 NOTE — Progress Notes (Signed)
EPIC Encounter for ICM Monitoring  Patient Name: [name] is a 74 y.o. female Date: 06/24/2014 Primary Care Physican: Velna Hatchet, MD Primary Cardiologist: Stanford Breed Electrophysiologist: Allred Dry Weight: 174 lbs       In the past month, have you:  1. Gained more than 2 pounds in a day or more than 5 pounds in a week? No. The highest she noticed her weight this month was 176 lbs.   2. Had changes in your medications (with verification of current medications)? no  3. Had more shortness of breath than is usual for you? no  4. Limited your activity because of shortness of breath? no  5. Not been able to sleep because of shortness of breath? no  6. Had increased swelling in your feet or ankles? no  7. Had symptoms of dehydration (dizziness, dry mouth, increased thirst, decreased urine output) no  8. Had changes in sodium restriction? Possibly. She was out of town from Wednesday - Saturday over this last week.   9. Been compliant with medication? Yes   ICM trend:   Follow-up plan: ICM clinic phone appointment: 08/15/14. The patient's impedence was below baseline from ~ 4/11-4/17. She was out of town from 4/13-4/16. She has had no change in her symptoms. She is due to follow up on 07/15/14 with Dr. Rayann Heman.   Copy of note sent to patient's primary care physician, primary cardiologist, and device following physician.  Alvis Lemmings, RN, BSN 06/24/2014 2:27 PM

## 2014-06-27 DIAGNOSIS — H35371 Puckering of macula, right eye: Secondary | ICD-10-CM | POA: Diagnosis not present

## 2014-07-08 ENCOUNTER — Ambulatory Visit (INDEPENDENT_AMBULATORY_CARE_PROVIDER_SITE_OTHER): Payer: Medicare Other

## 2014-07-08 DIAGNOSIS — Z7901 Long term (current) use of anticoagulants: Secondary | ICD-10-CM

## 2014-07-08 DIAGNOSIS — I059 Rheumatic mitral valve disease, unspecified: Secondary | ICD-10-CM | POA: Diagnosis not present

## 2014-07-08 DIAGNOSIS — I359 Nonrheumatic aortic valve disorder, unspecified: Secondary | ICD-10-CM | POA: Diagnosis not present

## 2014-07-08 DIAGNOSIS — I4891 Unspecified atrial fibrillation: Secondary | ICD-10-CM | POA: Diagnosis not present

## 2014-07-08 LAB — POCT INR: INR: 2.5

## 2014-07-15 ENCOUNTER — Encounter: Payer: Self-pay | Admitting: Internal Medicine

## 2014-07-15 ENCOUNTER — Ambulatory Visit (INDEPENDENT_AMBULATORY_CARE_PROVIDER_SITE_OTHER): Payer: Medicare Other | Admitting: Internal Medicine

## 2014-07-15 VITALS — BP 138/66 | HR 60 | Ht 63.5 in | Wt 178.8 lb

## 2014-07-15 DIAGNOSIS — I38 Endocarditis, valve unspecified: Secondary | ICD-10-CM

## 2014-07-15 DIAGNOSIS — I255 Ischemic cardiomyopathy: Secondary | ICD-10-CM

## 2014-07-15 DIAGNOSIS — I4891 Unspecified atrial fibrillation: Secondary | ICD-10-CM | POA: Diagnosis not present

## 2014-07-15 LAB — CUP PACEART INCLINIC DEVICE CHECK
Date Time Interrogation Session: 20160509143148
HighPow Impedance: 67.5 Ohm
Lead Channel Impedance Value: 450 Ohm
Lead Channel Impedance Value: 662.5 Ohm
Lead Channel Pacing Threshold Amplitude: 0.75 V
Lead Channel Pacing Threshold Pulse Width: 0.5 ms
Lead Channel Sensing Intrinsic Amplitude: 12 mV
Lead Channel Sensing Intrinsic Amplitude: 4.7 mV
Lead Channel Setting Pacing Amplitude: 2 V
Lead Channel Setting Pacing Amplitude: 2.5 V
Lead Channel Setting Pacing Pulse Width: 0.5 ms
MDC IDC MSMT BATTERY REMAINING LONGEVITY: 82.8 mo
MDC IDC MSMT LEADCHNL RA PACING THRESHOLD AMPLITUDE: 1 V
MDC IDC MSMT LEADCHNL RA PACING THRESHOLD PULSEWIDTH: 0.5 ms
MDC IDC PG SERIAL: 7080516
MDC IDC SET LEADCHNL RV SENSING SENSITIVITY: 0.5 mV
MDC IDC SET ZONE DETECTION INTERVAL: 300 ms
MDC IDC STAT BRADY RA PERCENT PACED: 31 %
MDC IDC STAT BRADY RV PERCENT PACED: 0.02 %
Zone Setting Detection Interval: 250 ms

## 2014-07-15 NOTE — Patient Instructions (Signed)
Medication Instructions:  Your physician recommends that you continue on your current medications as directed. Please refer to the Current Medication list given to you today.    Labwork: None ordered  Testing/Procedures: None ordered  Follow-Up: Your physician wants you to follow-up in: 12 months with Dr Rayann Heman Dennis Bast will receive a reminder letter in the mail two months in advance. If you don't receive a letter, please call our office to schedule the follow-up appointment.   Any Other Special Instructions Will Be Listed Below (If Applicable).

## 2014-07-15 NOTE — Progress Notes (Signed)
Electrophysiology Office Note   Date:  07/15/2014   ID:  [name], DOB Jul 10, 1940, MRN [id number]  PCP:  Velna Hatchet, MD  Cardiologist:  Dr Stanford Breed Primary Electrophysiologist: Thompson Grayer, MD    Chief Complaint  Patient presents with  . Atrial Fibrillation     History of Present Illness: [name] is a 74 y.o. female who presents today for electrophysiology evaluation.   Doing well at this time.  No ventricular arrhythmias. I tried to reduce amiodarone previously however she did not tolerate 100mg  daily (had more afib). Now doing well on 200mg  daily   SOB is stable.  Today, she denies symptoms of palpitations, chest pain,  orthopnea, PND, lower extremity edema, claudication, dizziness, presyncope, syncope, bleeding, or neurologic sequela. The patient is tolerating medications without difficulties and is otherwise without complaint today.    Past Medical History  Diagnosis Date  . Anemia   . Atrial fibrillation     A.fib/flutter: s/p MAZE 2008, DCCV 2011, on coumadin; previously on flecainide, but dc'd due to worsening EF. Tikosyn discontinued 06/2012 after cardiac arrest.  . Diverticulitis   . CVA (cerebral vascular accident)     2008 - felt due to oscillating calcium on aortic valve  . Carcinoma in situ of vulva   . Fibroid   . Ovarian cyst, left 2008-2009  . Osteoporosis   . Diabetes mellitus   . Hypertension   . GERD (gastroesophageal reflux disease)   . Chronic systolic CHF (congestive heart failure)     EF 35%  . Ischemic cardiomyopathy     EF 35%  . Coronary artery disease     Occluded LM 2/2 previous aortic root surgery; s/p 2v CABG 2010 LIMA to LAD, SVG to OM; Cath 03/11/12 patent SVG to OM, atretic LIMA to LAD, patent RCA, occluded native LM, EF 35%   . Arthritis     OSTEO  . Fracture of lumbar spine   . Ventricular fibrillation     VF arrest/VT/torsades 06/2012 with prolonged hosp with VDRF, cardiogen shock, asp PNA, encephalopathy, anemia, shock  liver, hypernatremia - s/p ICD implantation 06/08/12.  . Superficial venous thrombosis of upper extremity     06/2012 - LUE  . Severe aortic stenosis     s/p homograft aortic root replacement 2008  . Mitral regurgitation     s/p mitral valve repair 2008  . Thoracic aortic aneurysm     s/p resection and grafting 2008  . Stroke   . Kidney stones    Past Surgical History  Procedure Laterality Date  . Foot surgery Left     removed neuroma  . Aortic root replacement  2008    homograft  . Mitral valve replacement  2008    due to severe MR  . Resection and grafting of ascending thoracic aortic    . Aneurysm and left sided maze  2008  . Coronary artery bypass graft  4/10    LIMA to LAD, SVG to OM  . Wide excision of vulva      CA INSITU  . Cardiac defibrillator placement  06/08/2012    [place] [address]  implanted by Dr Rayann Heman following a cardiac arrest  . Right heart catheterization N/A 04/03/2012    Procedure: RIGHT HEART CATH;  Surgeon: [name], MD;  Location: [facility] CATH LAB;  Service: Cardiovascular;  Laterality: N/A;  . Implantable cardioverter defibrillator implant N/A 06/08/2012    Procedure: IMPLANTABLE CARDIOVERTER DEFIBRILLATOR IMPLANT;  Surgeon: Jeneen Rinks  Chrisa Hassan, MD;  Location: [facility] CATH LAB;  Service: Cardiovascular;  Laterality: N/A;     Current Outpatient Prescriptions  Medication Sig Dispense Refill  . amiodarone (PACERONE) 200 MG tablet Take 1 tablet (200 mg total) by mouth daily. 90 tablet 3  . carvedilol (COREG) 12.5 MG tablet Take 1 tablet (12.5 mg total) by mouth 2 (two) times daily with a meal. 180 tablet 3  . Cyanocobalamin (VITAMIN B-12) 2500 MCG SUBL Place 1 tablet under the tongue 3 (three) times a week. Dissolves 1 tablet under the tongue on Monday, Wednesday, and Friday    . denosumab (PROLIA) 60 MG/ML SOLN injection Inject 60 mg into the skin every 6 (six) months. Administer in upper arm, thigh, or abdomen    . ergocalciferol (VITAMIN D2) 50000  UNITS capsule Take 50,000 Units by mouth once a week.    . furosemide (LASIX) 20 MG tablet TAKE 1 TABLET BY MOUTH EVERY DAY 90 tablet 3  . linagliptin (TRADJENTA) 5 MG TABS tablet Take 1 tablet (5 mg total) by mouth daily. 30 tablet   . losartan (COZAAR) 50 MG tablet Take 1 tablet (50 mg total) by mouth daily. 90 tablet 1  . NON FORMULARY ([employer] Brand) Take 500 mg of elemental calcium by mouth at bedtime    . omeprazole (PRILOSEC OTC) 20 MG tablet Take 20 mg by mouth daily before breakfast.     . Psyllium (METAMUCIL PO) Take 1 teaspoon by mouth daily with water    . warfarin (COUMADIN) 3 MG tablet Take 0.5-1 tablets (1.5-3 mg total) by mouth daily. 30 tablet 3   No current facility-administered medications for this visit.    Allergies:   Metformin and related   Social History:  The patient  reports that she quit smoking about 31 years ago. Her smoking use included Cigarettes. She has never used smokeless tobacco. She reports that she does not drink alcohol or use illicit drugs.   Family History:  The patient's family history includes Breast cancer in her maternal aunt; Colon cancer in her paternal grandmother; Diabetes in her maternal uncle and paternal uncle; Emphysema in her father; Hypertension in her mother.    ROS:  Please see the history of present illness.   All other systems are reviewed and negative.    PHYSICAL EXAM: VS:  BP 138/66 mmHg  Pulse 60  Ht 5' 3.5" (1.613 m)  Wt 178 lb 12.8 oz (81.103 kg)  BMI 31.17 kg/m2 , BMI Body mass index is 31.17 kg/(m^2). GEN: Well nourished, well developed, in no acute distress HEENT: normal Neck: no JVD, carotid bruits, or masses Cardiac: RRR; no murmurs, rubs, or gallops,no edema  Respiratory:  clear to auscultation bilaterally, normal work of breathing GI: soft, nontender, nondistended, + BS MS: no deformity or atrophy Skin: warm and dry, device pocket is well healed Neuro:  Strength and sensation are intact Psych: euthymic mood, full  affect  EKG:  EKG is ordered today. The ekg ordered today shows atrial pacing at 60 bpm, septal infarct, otherwise normal ekg  Device interrogation is reviewed today in detail.  See PaceArt for details.   Recent Labs: 04/04/2014: ALT 11; BUN 21; Creatinine 1.49*; Hemoglobin 12.9; Platelets 311.0; Potassium 4.2; Sodium 137; TSH 4.46    Lipid Panel     Component Value Date/Time   CHOL 169 04/04/2014 1019   TRIG 141.0 04/04/2014 1019   HDL 59.50 04/04/2014 1019   CHOLHDL 3 04/04/2014 1019   VLDL 28.2 04/04/2014 1019  LDLCALC 81 04/04/2014 1019   LDLDIRECT 132.3 10/17/2008 0827     Wt Readings from Last 3 Encounters:  07/15/14 178 lb 12.8 oz (81.103 kg)  03/25/14 177 lb (80.287 kg)  11/27/13 170 lb (77.111 kg)      ASSESSMENT AND PLAN:   1. afib Well controlled with amiodarone Continue coumadin long term  2. S/p VF arrest Normal ICD function See Pace Art report No changes today  3. Valvular heart disease/ ischemic CM euvolemic is following Heather to follow with monthly ICMs  Merlin Return to see EP NP in 1 year Follow-up with Dr Stanford Breed as scheduled   Current medicines are reviewed at length with the patient today.   The patient does not have concerns regarding her medicines.  The following changes were made today:  none  Labs/ tests ordered today include:  Orders Placed This Encounter  Procedures  . Implantable device check   Signed, Thompson Grayer, MD  07/15/2014 5:44 PM     [place] [address] [phone] (office) [phone] (fax)

## 2014-07-22 DIAGNOSIS — N200 Calculus of kidney: Secondary | ICD-10-CM | POA: Diagnosis not present

## 2014-07-22 DIAGNOSIS — R310 Gross hematuria: Secondary | ICD-10-CM | POA: Diagnosis not present

## 2014-08-07 ENCOUNTER — Ambulatory Visit (INDEPENDENT_AMBULATORY_CARE_PROVIDER_SITE_OTHER): Payer: Medicare Other | Admitting: *Deleted

## 2014-08-07 DIAGNOSIS — I4891 Unspecified atrial fibrillation: Secondary | ICD-10-CM | POA: Diagnosis not present

## 2014-08-07 DIAGNOSIS — I359 Nonrheumatic aortic valve disorder, unspecified: Secondary | ICD-10-CM

## 2014-08-07 DIAGNOSIS — I059 Rheumatic mitral valve disease, unspecified: Secondary | ICD-10-CM | POA: Diagnosis not present

## 2014-08-07 DIAGNOSIS — Z7901 Long term (current) use of anticoagulants: Secondary | ICD-10-CM | POA: Diagnosis not present

## 2014-08-07 LAB — POCT INR: INR: 2.4

## 2014-08-14 DIAGNOSIS — C44519 Basal cell carcinoma of skin of other part of trunk: Secondary | ICD-10-CM | POA: Diagnosis not present

## 2014-08-14 DIAGNOSIS — D485 Neoplasm of uncertain behavior of skin: Secondary | ICD-10-CM | POA: Diagnosis not present

## 2014-08-15 ENCOUNTER — Ambulatory Visit (INDEPENDENT_AMBULATORY_CARE_PROVIDER_SITE_OTHER): Payer: Medicare Other | Admitting: *Deleted

## 2014-08-15 ENCOUNTER — Telehealth: Payer: Self-pay | Admitting: *Deleted

## 2014-08-15 ENCOUNTER — Encounter: Payer: Self-pay | Admitting: *Deleted

## 2014-08-15 DIAGNOSIS — I255 Ischemic cardiomyopathy: Secondary | ICD-10-CM | POA: Diagnosis not present

## 2014-08-15 DIAGNOSIS — Z95810 Presence of automatic (implantable) cardiac defibrillator: Secondary | ICD-10-CM | POA: Diagnosis not present

## 2014-08-15 NOTE — Telephone Encounter (Signed)
ICM transmission received. I left a message for the patient to call. 

## 2014-08-15 NOTE — Telephone Encounter (Signed)
I spoke with the patient. 

## 2014-08-15 NOTE — Progress Notes (Signed)
EPIC Encounter for ICM Monitoring  Patient Name: [name] is a 74 y.o. female Date: 08/15/2014 Primary Care Physican: Velna Hatchet, MD Primary Cardiologist: Stanford Breed Electrophysiologist: Allred Dry Weight: 174 lbs       In the past month, have you:  1. Gained more than 2 pounds in a day or more than 5 pounds in a week? no  2. Had changes in your medications (with verification of current medications)? no  3. Had more shortness of breath than is usual for you? no  4. Limited your activity because of shortness of breath? no  5. Not been able to sleep because of shortness of breath? no  6. Had increased swelling in your feet or ankles? no  7. Had symptoms of dehydration (dizziness, dry mouth, increased thirst, decreased urine output) no  8. Had changes in sodium restriction? no  9. Been compliant with medication? Yes   ICM trend:   Follow-up plan: ICM clinic phone appointment: 09/16/14. No changes made today. The patient did state that when she last saw Dr. Rayann Heman in May, he noted a red area on her chest just off set from the ICD site. She states she has had this for some time and it will occasionally itch and bleed if she rubs it. She was told by Dr. Rayann Heman to follow up with dermatology to ensure nothing invasive was affecting the ICD site. She did follow up with dermatology yesterday and a biopsy was taken for possible basal cell carcinoma. She will know results in about a week.   Copy of note sent to patient's primary care physician, primary cardiologist, and device following physician.  Alvis Lemmings, RN, BSN 08/15/2014 3:52 PM

## 2014-08-19 DIAGNOSIS — I10 Essential (primary) hypertension: Secondary | ICD-10-CM | POA: Diagnosis not present

## 2014-08-19 DIAGNOSIS — Z6831 Body mass index (BMI) 31.0-31.9, adult: Secondary | ICD-10-CM | POA: Diagnosis not present

## 2014-08-19 DIAGNOSIS — L989 Disorder of the skin and subcutaneous tissue, unspecified: Secondary | ICD-10-CM | POA: Diagnosis not present

## 2014-08-19 DIAGNOSIS — N183 Chronic kidney disease, stage 3 (moderate): Secondary | ICD-10-CM | POA: Diagnosis not present

## 2014-08-19 DIAGNOSIS — E119 Type 2 diabetes mellitus without complications: Secondary | ICD-10-CM | POA: Diagnosis not present

## 2014-09-02 ENCOUNTER — Ambulatory Visit (INDEPENDENT_AMBULATORY_CARE_PROVIDER_SITE_OTHER): Payer: Medicare Other | Admitting: *Deleted

## 2014-09-02 ENCOUNTER — Other Ambulatory Visit: Payer: Self-pay

## 2014-09-02 DIAGNOSIS — I059 Rheumatic mitral valve disease, unspecified: Secondary | ICD-10-CM | POA: Diagnosis not present

## 2014-09-02 DIAGNOSIS — I4891 Unspecified atrial fibrillation: Secondary | ICD-10-CM

## 2014-09-02 DIAGNOSIS — Z7901 Long term (current) use of anticoagulants: Secondary | ICD-10-CM | POA: Diagnosis not present

## 2014-09-02 DIAGNOSIS — I359 Nonrheumatic aortic valve disorder, unspecified: Secondary | ICD-10-CM | POA: Diagnosis not present

## 2014-09-02 LAB — POCT INR: INR: 2.4

## 2014-09-10 DIAGNOSIS — H2511 Age-related nuclear cataract, right eye: Secondary | ICD-10-CM | POA: Diagnosis not present

## 2014-09-10 DIAGNOSIS — H2512 Age-related nuclear cataract, left eye: Secondary | ICD-10-CM | POA: Diagnosis not present

## 2014-09-10 DIAGNOSIS — H02839 Dermatochalasis of unspecified eye, unspecified eyelid: Secondary | ICD-10-CM | POA: Diagnosis not present

## 2014-09-10 DIAGNOSIS — H18411 Arcus senilis, right eye: Secondary | ICD-10-CM | POA: Diagnosis not present

## 2014-09-15 ENCOUNTER — Other Ambulatory Visit: Payer: Self-pay | Admitting: Cardiology

## 2014-09-17 ENCOUNTER — Ambulatory Visit (INDEPENDENT_AMBULATORY_CARE_PROVIDER_SITE_OTHER): Payer: Medicare Other | Admitting: *Deleted

## 2014-09-17 ENCOUNTER — Telehealth: Payer: Self-pay | Admitting: *Deleted

## 2014-09-17 ENCOUNTER — Encounter: Payer: Self-pay | Admitting: *Deleted

## 2014-09-17 DIAGNOSIS — I255 Ischemic cardiomyopathy: Secondary | ICD-10-CM

## 2014-09-17 DIAGNOSIS — Z95810 Presence of automatic (implantable) cardiac defibrillator: Secondary | ICD-10-CM | POA: Diagnosis not present

## 2014-09-17 NOTE — Telephone Encounter (Signed)
Clearance note and last OV note of Dr. Jacalyn Lefevre routed via [facility]

## 2014-09-17 NOTE — Telephone Encounter (Signed)
1. Type of surgery: cataract extraction with intraocular lens implantation of right eye/left eye   2. Date of surgery: November 04, 2014  3. Surgeon: Dr. Darleen Crocker ([facility]  4. Medications that need to be held & how long: none specified   5. Fax and/or Phone: (f) [phone]  (p) [phone]     Attn: Sharyn Lull (surgical coordinator)      >> also sent "pertinent records"

## 2014-09-17 NOTE — Telephone Encounter (Signed)
Ok for surgery; if coumadin needs to be held, would need lovenox bridge through pharmacy; otherwise continue coumadin if ok with ophthalmology. [name]

## 2014-09-17 NOTE — Progress Notes (Signed)
EPIC Encounter for ICM Monitoring  Patient Name: [name] is a 74 y.o. female Date: 09/17/2014 Primary Care Physican: Velna Hatchet, MD Primary Cardiologist: Stanford Breed Electrophysiologist: Allred Dry Weight: 174 lbs       In the past month, have you:  1. Gained more than 2 pounds in a day or more than 5 pounds in a week? no  2. Had changes in your medications (with verification of current medications)? no  3. Had more shortness of breath than is usual for you? no  4. Limited your activity because of shortness of breath? no  5. Not been able to sleep because of shortness of breath? no  6. Had increased swelling in your feet or ankles? no  7. Had symptoms of dehydration (dizziness, dry mouth, increased thirst, decreased urine output) no  8. Had changes in sodium restriction? no  9. Been compliant with medication? Yes   ICM trend:   Follow-up plan: ICM clinic phone appointment: 10/21/14. The patient is doing well today from a cardiac standpoint. She had told me last month that she had a reddened area on her skin just off set from her device sight. This was looked at by dermatology and she tells me today that her biopsy was positive for basal cell carcinoma. She is scheduled for surgery on this site on 10/08/14. She will also be having cataract surgery on 8/29 for the first eye and again on 9/19 for the second eye. She will have a Prolia injection on 8/19. She is scheduled to follow up with the coumadin clinic on 8/1 for an INR check prior to surgery, but has been told by the performing MD's office that her INR needs to be between 2-3. Will forward to [place] as an [place]. No other changes made today.  Copy of note sent to patient's primary care physician, primary cardiologist, and device following physician.  Alvis Lemmings, RN, BSN 09/17/2014 2:42 PM

## 2014-09-26 ENCOUNTER — Encounter: Payer: Self-pay | Admitting: Gastroenterology

## 2014-10-07 ENCOUNTER — Ambulatory Visit (INDEPENDENT_AMBULATORY_CARE_PROVIDER_SITE_OTHER): Payer: Medicare Other | Admitting: *Deleted

## 2014-10-07 DIAGNOSIS — Z7901 Long term (current) use of anticoagulants: Secondary | ICD-10-CM

## 2014-10-07 DIAGNOSIS — I4891 Unspecified atrial fibrillation: Secondary | ICD-10-CM | POA: Diagnosis not present

## 2014-10-07 DIAGNOSIS — I359 Nonrheumatic aortic valve disorder, unspecified: Secondary | ICD-10-CM

## 2014-10-07 DIAGNOSIS — I059 Rheumatic mitral valve disease, unspecified: Secondary | ICD-10-CM

## 2014-10-07 LAB — POCT INR: INR: 2.9

## 2014-10-08 DIAGNOSIS — L905 Scar conditions and fibrosis of skin: Secondary | ICD-10-CM | POA: Diagnosis not present

## 2014-10-08 DIAGNOSIS — C44519 Basal cell carcinoma of skin of other part of trunk: Secondary | ICD-10-CM | POA: Diagnosis not present

## 2014-10-10 DIAGNOSIS — L905 Scar conditions and fibrosis of skin: Secondary | ICD-10-CM | POA: Diagnosis not present

## 2014-10-14 ENCOUNTER — Ambulatory Visit (INDEPENDENT_AMBULATORY_CARE_PROVIDER_SITE_OTHER): Payer: Medicare Other | Admitting: *Deleted

## 2014-10-14 DIAGNOSIS — Z95810 Presence of automatic (implantable) cardiac defibrillator: Secondary | ICD-10-CM

## 2014-10-14 NOTE — Progress Notes (Signed)
Wound check for basal cell carcinoma removal near device site. Incision about 1 inch medial of device pocket. Incision covered with steri-strips, without redness, swelling or drainage. Instructed to call if area over device becomes red, swollen or if she has fever or chills. Pt and husband verbalize understanding. Continue with scheduled follow up (remote 10/21/14, ROV with Chanetta Marshall, NP in May 2017).

## 2014-10-21 ENCOUNTER — Encounter: Payer: Self-pay | Admitting: Internal Medicine

## 2014-10-21 ENCOUNTER — Ambulatory Visit (INDEPENDENT_AMBULATORY_CARE_PROVIDER_SITE_OTHER): Payer: Medicare Other | Admitting: *Deleted

## 2014-10-21 DIAGNOSIS — Z95810 Presence of automatic (implantable) cardiac defibrillator: Secondary | ICD-10-CM | POA: Diagnosis not present

## 2014-10-21 DIAGNOSIS — I255 Ischemic cardiomyopathy: Secondary | ICD-10-CM

## 2014-10-21 NOTE — Progress Notes (Signed)
Remote ICD transmission.   

## 2014-10-24 LAB — CUP PACEART REMOTE DEVICE CHECK
Battery Remaining Longevity: 74 mo
Battery Remaining Percentage: 75 %
Battery Voltage: 3.01 V
Brady Statistic AP VP Percent: 1 %
Brady Statistic AP VS Percent: 47 %
Brady Statistic RA Percent Paced: 45 %
Brady Statistic RV Percent Paced: 1 %
Date Time Interrogation Session: 20160815060016
HIGH POWER IMPEDANCE MEASURED VALUE: 64 Ohm
HighPow Impedance: 64 Ohm
Lead Channel Impedance Value: 450 Ohm
Lead Channel Impedance Value: 660 Ohm
Lead Channel Pacing Threshold Amplitude: 0.75 V
Lead Channel Pacing Threshold Pulse Width: 0.5 ms
Lead Channel Sensing Intrinsic Amplitude: 5 mV
Lead Channel Setting Pacing Amplitude: 2 V
Lead Channel Setting Sensing Sensitivity: 0.5 mV
MDC IDC MSMT LEADCHNL RA PACING THRESHOLD AMPLITUDE: 1 V
MDC IDC MSMT LEADCHNL RA PACING THRESHOLD PULSEWIDTH: 0.5 ms
MDC IDC MSMT LEADCHNL RV SENSING INTR AMPL: 12 mV
MDC IDC SET LEADCHNL RV PACING AMPLITUDE: 2.5 V
MDC IDC SET LEADCHNL RV PACING PULSEWIDTH: 0.5 ms
MDC IDC SET ZONE DETECTION INTERVAL: 250 ms
MDC IDC STAT BRADY AS VP PERCENT: 1 %
MDC IDC STAT BRADY AS VS PERCENT: 53 %
Pulse Gen Serial Number: 7080516
Zone Setting Detection Interval: 300 ms

## 2014-10-24 NOTE — Progress Notes (Signed)
EPIC Encounter for ICM Monitoring  Patient Name: [name] is a 74 y.o. female Date: 10/24/2014 Primary Care Physican: Velna Hatchet, MD Primary Cardiologist: Stanford Breed Electrophysiologist: Allred Dry Weight: 174-176 lb range       In the past month, have you:  1. Gained more than 2 pounds in a day or more than 5 pounds in a week? No, patient weighs once every 1 to 2 weeks.  Education given on importance of weighing daily to monitor fluid retention.  2. Had changes in your medications (with verification of current medications)? no  3. Had more shortness of breath than is usual for you? no  4. Limited your activity because of shortness of breath? no  5. Not been able to sleep because of shortness of breath? no  6. Had increased swelling in your feet or ankles? no  7. Had symptoms of dehydration (dizziness, dry mouth, increased thirst, decreased urine output) no  8. Had changes in sodium restriction? no  9. Been compliant with medication? Yes   ICM trend:   Follow-up plan: ICM clinic phone appointment on 11/26/2014.  Patient will be having cataract surgery next week. No changes today.  Copy of note sent to patient's primary care physician, primary cardiologist, and device following physician.  Rosalene Billings, RN, CCM 10/24/2014 12:23 PM

## 2014-10-28 DIAGNOSIS — Z6831 Body mass index (BMI) 31.0-31.9, adult: Secondary | ICD-10-CM | POA: Diagnosis not present

## 2014-10-28 DIAGNOSIS — I509 Heart failure, unspecified: Secondary | ICD-10-CM | POA: Diagnosis not present

## 2014-10-28 DIAGNOSIS — I10 Essential (primary) hypertension: Secondary | ICD-10-CM | POA: Diagnosis not present

## 2014-10-28 DIAGNOSIS — J069 Acute upper respiratory infection, unspecified: Secondary | ICD-10-CM | POA: Diagnosis not present

## 2014-10-28 DIAGNOSIS — N183 Chronic kidney disease, stage 3 (moderate): Secondary | ICD-10-CM | POA: Diagnosis not present

## 2014-10-28 DIAGNOSIS — Z7901 Long term (current) use of anticoagulants: Secondary | ICD-10-CM | POA: Diagnosis not present

## 2014-10-28 DIAGNOSIS — I480 Paroxysmal atrial fibrillation: Secondary | ICD-10-CM | POA: Diagnosis not present

## 2014-10-28 DIAGNOSIS — Z952 Presence of prosthetic heart valve: Secondary | ICD-10-CM | POA: Diagnosis not present

## 2014-10-29 ENCOUNTER — Encounter: Payer: Self-pay | Admitting: Cardiology

## 2014-11-04 DIAGNOSIS — H2511 Age-related nuclear cataract, right eye: Secondary | ICD-10-CM | POA: Diagnosis not present

## 2014-11-04 DIAGNOSIS — H25811 Combined forms of age-related cataract, right eye: Secondary | ICD-10-CM | POA: Diagnosis not present

## 2014-11-05 DIAGNOSIS — H2512 Age-related nuclear cataract, left eye: Secondary | ICD-10-CM | POA: Diagnosis not present

## 2014-11-09 DIAGNOSIS — M545 Low back pain: Secondary | ICD-10-CM | POA: Diagnosis not present

## 2014-11-11 ENCOUNTER — Encounter (HOSPITAL_COMMUNITY): Payer: Self-pay

## 2014-11-11 ENCOUNTER — Emergency Department (HOSPITAL_COMMUNITY)
Admission: EM | Admit: 2014-11-11 | Discharge: 2014-11-11 | Disposition: A | Discharge status: Home / Self Care | Payer: Medicare Other | Attending: Emergency Medicine | Admitting: Emergency Medicine

## 2014-11-11 ENCOUNTER — Emergency Department (HOSPITAL_COMMUNITY): Payer: Medicare Other

## 2014-11-11 DIAGNOSIS — Z87442 Personal history of urinary calculi: Secondary | ICD-10-CM | POA: Insufficient documentation

## 2014-11-11 DIAGNOSIS — K219 Gastro-esophageal reflux disease without esophagitis: Secondary | ICD-10-CM | POA: Insufficient documentation

## 2014-11-11 DIAGNOSIS — I4891 Unspecified atrial fibrillation: Secondary | ICD-10-CM | POA: Insufficient documentation

## 2014-11-11 DIAGNOSIS — M5126 Other intervertebral disc displacement, lumbar region: Secondary | ICD-10-CM | POA: Diagnosis not present

## 2014-11-11 DIAGNOSIS — E119 Type 2 diabetes mellitus without complications: Secondary | ICD-10-CM | POA: Diagnosis not present

## 2014-11-11 DIAGNOSIS — M1990 Unspecified osteoarthritis, unspecified site: Secondary | ICD-10-CM | POA: Insufficient documentation

## 2014-11-11 DIAGNOSIS — I2510 Atherosclerotic heart disease of native coronary artery without angina pectoris: Secondary | ICD-10-CM | POA: Diagnosis not present

## 2014-11-11 DIAGNOSIS — R52 Pain, unspecified: Secondary | ICD-10-CM

## 2014-11-11 DIAGNOSIS — Z86018 Personal history of other benign neoplasm: Secondary | ICD-10-CM | POA: Diagnosis not present

## 2014-11-11 DIAGNOSIS — I10 Essential (primary) hypertension: Secondary | ICD-10-CM | POA: Insufficient documentation

## 2014-11-11 DIAGNOSIS — M545 Low back pain, unspecified: Secondary | ICD-10-CM

## 2014-11-11 DIAGNOSIS — Z79899 Other long term (current) drug therapy: Secondary | ICD-10-CM | POA: Insufficient documentation

## 2014-11-11 DIAGNOSIS — Z8742 Personal history of other diseases of the female genital tract: Secondary | ICD-10-CM | POA: Diagnosis not present

## 2014-11-11 DIAGNOSIS — Z9849 Cataract extraction status, unspecified eye: Secondary | ICD-10-CM | POA: Diagnosis not present

## 2014-11-11 DIAGNOSIS — Z862 Personal history of diseases of the blood and blood-forming organs and certain disorders involving the immune mechanism: Secondary | ICD-10-CM | POA: Insufficient documentation

## 2014-11-11 DIAGNOSIS — R319 Hematuria, unspecified: Secondary | ICD-10-CM | POA: Diagnosis not present

## 2014-11-11 DIAGNOSIS — Z7901 Long term (current) use of anticoagulants: Secondary | ICD-10-CM | POA: Insufficient documentation

## 2014-11-11 DIAGNOSIS — I5022 Chronic systolic (congestive) heart failure: Secondary | ICD-10-CM | POA: Diagnosis not present

## 2014-11-11 DIAGNOSIS — Z8673 Personal history of transient ischemic attack (TIA), and cerebral infarction without residual deficits: Secondary | ICD-10-CM | POA: Insufficient documentation

## 2014-11-11 DIAGNOSIS — Z951 Presence of aortocoronary bypass graft: Secondary | ICD-10-CM | POA: Insufficient documentation

## 2014-11-11 LAB — BASIC METABOLIC PANEL
Anion gap: 9 (ref 5–15)
BUN: 21 mg/dL — ABNORMAL HIGH (ref 6–20)
CHLORIDE: 102 mmol/L (ref 101–111)
CO2: 29 mmol/L (ref 22–32)
CREATININE: 1.5 mg/dL — AB (ref 0.44–1.00)
Calcium: 8.9 mg/dL (ref 8.9–10.3)
GFR calc non Af Amer: 33 mL/min — ABNORMAL LOW (ref >60)
GFR, EST AFRICAN AMERICAN: 39 mL/min — AB (ref >60)
Glucose, Bld: 134 mg/dL — ABNORMAL HIGH (ref 65–99)
Potassium: 4.2 mmol/L (ref 3.5–5.1)
SODIUM: 140 mmol/L (ref 135–145)

## 2014-11-11 LAB — URINALYSIS, ROUTINE W REFLEX MICROSCOPIC
Bilirubin Urine: NEGATIVE
Glucose, UA: NEGATIVE mg/dL
Ketones, ur: NEGATIVE mg/dL
Nitrite: NEGATIVE
Protein, ur: 30 mg/dL — AB
Specific Gravity, Urine: 1.013 (ref 1.005–1.030)
UROBILINOGEN UA: 0.2 mg/dL (ref 0.0–1.0)
pH: 6 (ref 5.0–8.0)

## 2014-11-11 LAB — PROTIME-INR
INR: 2.79 — ABNORMAL HIGH (ref 0.00–1.49)
PROTHROMBIN TIME: 29 s — AB (ref 11.6–15.2)

## 2014-11-11 LAB — URINE MICROSCOPIC-ADD ON

## 2014-11-11 MED ORDER — CEPHALEXIN 500 MG PO CAPS
500.0000 mg | ORAL_CAPSULE | Freq: Once | ORAL | Status: AC
  Administered 2014-11-11: 500 mg via ORAL
  Filled 2014-11-11: qty 1

## 2014-11-11 MED ORDER — CEPHALEXIN 500 MG PO CAPS: 500.0000 mg | ORAL_CAPSULE | Freq: Three times a day (TID) | ORAL | Status: DC

## 2014-11-11 MED ORDER — HYDROCODONE-ACETAMINOPHEN 5-325 MG PO TABS: 1.0000 | ORAL_TABLET | Freq: Four times a day (QID) | ORAL | Status: DC | PRN

## 2014-11-11 MED ORDER — IOHEXOL 300 MG/ML  SOLN
100.0000 mL | Freq: Once | INTRAMUSCULAR | Status: AC | PRN
  Administered 2014-11-11: 80 mL via INTRAVENOUS

## 2014-11-11 NOTE — ED Notes (Signed)
Per pt, back pain since Friday morning.  Pt states worse with movement and upon awakening in the morning.  No change in urination.  No burning.  Pt went to ortho urgent care.  Xray was done and no new dx found.

## 2014-11-11 NOTE — ED Provider Notes (Addendum)
CSN: [id number]     Arrival date & time 11/11/14  0927 History   First MD Initiated Contact with Patient 11/11/14 [phone]     Chief Complaint  Patient presents with  . Back Pain     (Consider location/radiation/quality/duration/timing/severity/associated sxs/prior Treatment) HPI Complains of low back pain onset 3 days ago. Pain is nonradiating. No loss of bladder bowel control pain is worse with changing positions improved with remaining still no abdominal pain no chest pain no shortness of breath no fever. Patient was seen at [facility] 3 days ago prescribed Valium which is taken with partial relief. She's also taken Tylenol without adequate relief. No other associated symptoms Past Medical History  Diagnosis Date  . Anemia   . Atrial fibrillation     A.fib/flutter: s/p MAZE 2008, DCCV 2011, on coumadin; previously on flecainide, but dc'd due to worsening EF. Tikosyn discontinued 06/2012 after cardiac arrest.  . Diverticulitis   . CVA (cerebral vascular accident)     2008 - felt due to oscillating calcium on aortic valve  . Carcinoma in situ of vulva   . Fibroid   . Ovarian cyst, left 2008-2009  . Osteoporosis   . Diabetes mellitus   . Hypertension   . GERD (gastroesophageal reflux disease)   . Chronic systolic CHF (congestive heart failure)     EF 35%  . Ischemic cardiomyopathy     EF 35%  . Coronary artery disease     Occluded LM 2/2 previous aortic root surgery; s/p 2v CABG 2010 LIMA to LAD, SVG to OM; Cath 03/11/12 patent SVG to OM, atretic LIMA to LAD, patent RCA, occluded native LM, EF 35%   . Arthritis     OSTEO  . Fracture of lumbar spine   . 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  . Superficial venous thrombosis of upper extremity     06/2012 - LUE  . Severe aortic stenosis     s/p homograft aortic root  replacement 2008  . Mitral regurgitation     s/p mitral valve repair 2008  . Thoracic aortic aneurysm     s/p resection and grafting 2008  . Stroke   . Kidney stones    Past Surgical History  Procedure Laterality Date  . Foot surgery Left     removed neuroma  . Aortic root replacement  2008    homograft  . Mitral valve replacement  2008    due to severe MR  . Resection and grafting of ascending thoracic aortic    . Aneurysm and left sided maze  2008  . Coronary artery bypass graft  4/10    LIMA to LAD, SVG to OM  . Wide excision of vulva      CA INSITU  . Cardiac defibrillator placement  06/08/2012    [facility] [place] DR  implanted by Dr Rayann Heman following a cardiac arrest  . Right heart catheterization N/A 04/03/2012    Procedure: RIGHT HEART CATH;  Surgeon: [name], MD;  Location: [facility] CATH LAB;  Service: Cardiovascular;  Laterality: N/A;  . Implantable cardioverter defibrillator implant N/A 06/08/2012    Procedure: IMPLANTABLE CARDIOVERTER DEFIBRILLATOR IMPLANT;  Surgeon: Thompson Grayer, MD;  Location: [facility] CATH LAB;  Service: Cardiovascular;  Laterality: N/A;  . Cataract extraction     Family History  Problem Relation Age of Onset  . Hypertension  Mother   . Breast cancer Maternal Aunt     age 76's  . Colon cancer Paternal Grandmother   . Diabetes Maternal Uncle   . Diabetes Paternal Uncle   . Emphysema Father    Social History  Substance Use Topics  . Smoking status: Former Smoker    Types: Cigarettes    Quit date: 03/09/1983  . Smokeless tobacco: Never Used  . Alcohol Use: No   OB History    Gravida Para Term Preterm AB TAB SAB Ectopic Multiple Living   0              Review of Systems  Constitutional: Negative.   HENT: Negative.   Respiratory: Negative.   Cardiovascular: Negative.   Gastrointestinal: Negative.   Genitourinary: Positive for hematuria.       Chronic hematuria  Musculoskeletal: Positive for back pain.  Skin: Negative.    Allergic/Immunologic: Positive for immunocompromised state.       Diabetic  Neurological: Negative.   Psychiatric/Behavioral: Negative.   All other systems reviewed and are negative.     Allergies  Metformin and related and Tikosyn  Home Medications   Prior to Admission medications   Medication Sig Start Date End Date Taking? Authorizing Provider  amiodarone (PACERONE) 200 MG tablet Take 1 tablet (200 mg total) by mouth daily. 12/31/13  Yes Thompson Grayer, MD  BESIVANCE 0.6 % SUSP Place 1 drop into the right eye as directed. Tapering off after surgery 10/27/14  Yes Historical Provider, MD  carvedilol (COREG) 12.5 MG tablet Take 1 tablet (12.5 mg total) by mouth 2 (two) times daily with a meal. 03/14/14  Yes Thompson Grayer, MD  Cyanocobalamin (VITAMIN B-12) 2500 MCG SUBL Place 1 tablet under the tongue every Monday, Wednesday, and Friday.    Yes Historical Provider, MD  denosumab (PROLIA) 60 MG/ML SOLN injection Inject 60 mg into the skin every 6 (six) months. Administer in upper arm, thigh, or abdomen   Yes Historical Provider, MD  diazepam (VALIUM) 5 MG tablet Take 5 mg by mouth every 8 (eight) hours as needed for anxiety.   Yes Historical Provider, MD  DUREZOL 0.05 % EMUL Place 1 drop into the right eye as directed. Tapering off post surgery 10/27/14  Yes Historical Provider, MD  ergocalciferol (VITAMIN D2) 50000 UNITS capsule Take 50,000 Units by mouth once a week. Monday   Yes Historical Provider, MD  furosemide (LASIX) 20 MG tablet TAKE 1 TABLET BY MOUTH EVERY DAY 04/16/14  Yes Lelon Perla, MD  ILEVRO 0.3 % ophthalmic suspension Place 1 drop into the right eye at bedtime. 10/27/14  Yes Historical Provider, MD  linagliptin (TRADJENTA) 5 MG TABS tablet Take 1 tablet (5 mg total) by mouth daily. 04/17/13  Yes Lelon Perla, MD  losartan (COZAAR) 50 MG tablet Take 1 tablet (50 mg total) by mouth daily. 05/22/14  Yes Lelon Perla, MD  NON FORMULARY ([employer]) Take 500 mg of elemental  calcium by mouth at bedtime   Yes Historical Provider, MD  omeprazole (PRILOSEC OTC) 20 MG tablet Take 20 mg by mouth daily before breakfast.    Yes Historical Provider, MD  Psyllium (METAMUCIL PO) Take 1 teaspoon by mouth daily with water   Yes Historical Provider, MD  warfarin (COUMADIN) 3 MG tablet TAKE 0.5-1 TABLETS (1.5-3 MG TOTAL) BY MOUTH DAILY. Patient taking differently: Take 1.25 mg on Mon, Wed, Fri, and Saturday. Take  3mg  on Tues, Thru, Sunday 09/17/14  Yes Lelon Perla,  MD   BP 150/68 mmHg  Pulse 72  Temp(Src) 97.9 F (36.6 C) (Oral)  Resp 18  SpO2 98% Physical Exam  Constitutional: She is oriented to person, place, and time. She appears well-developed and well-nourished. No distress.  HENT:  Head: Normocephalic and atraumatic.  Eyes: Conjunctivae are normal. Pupils are equal, round, and reactive to light.  Neck: Neck supple. No tracheal deviation present. No thyromegaly present.  Cardiovascular: Normal rate.   No murmur heard. Pulmonary/Chest: Effort normal and breath sounds normal.  Abdominal: Soft. Bowel sounds are normal. She exhibits no distension. There is no tenderness.  Musculoskeletal: Normal range of motion. She exhibits no edema or tenderness.  Entire spine is nontender. She has pain at lumbar when she stands up from a supine position.  Neurological: She is alert and oriented to person, place, and time. She has normal reflexes. No cranial nerve deficit. Coordination normal.  DTRs symmetric bilaterally and knee jerk and ankle jerk and biceps was ordered bilaterally gait normal  Skin: Skin is warm and dry. No rash noted.  Psychiatric: She has a normal mood and affect.  Nursing note and vitals reviewed.   ED Course  Procedures (including critical care time) Labs Review Labs Reviewed - No data to display  Imaging Review No results found. I have personally reviewed and evaluated these images and lab results as part of my medical decision-making.   EKG  Interpretation None     Declines pain medicine presently Results for orders placed or performed during the hospital encounter of [id number]  Basic metabolic panel  Result Value Ref Range   Sodium 140 135 - 145 mmol/L   Potassium 4.2 3.5 - 5.1 mmol/L   Chloride 102 101 - 111 mmol/L   CO2 29 22 - 32 mmol/L   Glucose, Bld 134 (H) 65 - 99 mg/dL   BUN 21 (H) 6 - 20 mg/dL   Creatinine, Ser 1.50 (H) 0.44 - 1.00 mg/dL   Calcium 8.9 8.9 - 10.3 mg/dL   GFR calc non Af Amer 33 (L) >60 mL/min   GFR calc Af Amer 39 (L) >60 mL/min   Anion gap 9 5 - 15  Urinalysis, Routine w reflex microscopic (not at [facility])  Result Value Ref Range   Color, Urine AMBER (A) YELLOW   APPearance TURBID (A) CLEAR   Specific Gravity, Urine 1.013 1.005 - 1.030   pH 6.0 5.0 - 8.0   Glucose, UA NEGATIVE NEGATIVE mg/dL   Hgb urine dipstick LARGE (A) NEGATIVE   Bilirubin Urine NEGATIVE NEGATIVE   Ketones, ur NEGATIVE NEGATIVE mg/dL   Protein, ur 30 (A) NEGATIVE mg/dL   Urobilinogen, UA 0.2 0.0 - 1.0 mg/dL   Nitrite NEGATIVE NEGATIVE   Leukocytes, UA MODERATE (A) NEGATIVE  Protime-INR  Result Value Ref Range   Prothrombin Time 29.0 (H) 11.6 - 15.2 seconds   INR 2.79 (H) 0.00 - 1.49  Urine microscopic-add on  Result Value Ref Range   Squamous Epithelial / LPF FEW (A) RARE   WBC, UA 7-10 <3 WBC/hpf   RBC / HPF 21-50 <3 RBC/hpf   Bacteria, UA MANY (A) RARE   Ct Lumbar Spine W Contrast  11/11/2014   CLINICAL DATA:  Back pain for several days. Diabetes. Taking Coumadin. Unable to get an MRI due to an AICD.  EXAM: CT LUMBAR SPINE WITH CONTRAST  TECHNIQUE: Multidetector CT imaging of the lumbar spine was performed with intravenous contrast administration. Multiplanar CT image reconstructions were also generated.  CONTRAST:  64mL OMNIPAQUE IOHEXOL 300 MG/ML  SOLN  COMPARISON:  Abdomen and pelvis CT dated 03/13/2012.  FINDINGS: Five non-rib-bearing lumbar vertebrae.  Mild levoconvex scoliosis.  Interval approximately 15% T12  superior endplate compression deformity and sclerosis. No acute fracture lines. 5 mm of bony retropulsion. Mild associated canal narrowing without foraminal stenosis.  T12-L1:  Unremarkable.  L1-2:  Minimal anterior spur formation.  L2-3: Mild diffuse disc bulging and mild bilateral facet hypertrophy, causing minimal canal stenosis, mild foraminal stenosis on the left and minimal foraminal stenosis on the right.  L3-4: Stable 20% L3 inferior endplate compression deformity with resolution of previously demonstrated sclerosis. Mild bony retropulsion. Mild diffuse disc bulging. Mild ligamentum flavum hypertrophy on the left. These changes are producing mild canal stenosis and mild bilateral foraminal stenosis.  L4-5: Moderate diffuse disc bulging and mild to moderate bilateral facet hypertrophy. These changes are producing moderate canal stenosis and mild to moderate bilateral foraminal stenosis.  L5-S1: Minimal diffuse disc bulging and mild facet hypertrophy on the right without significant canal or foraminal stenosis.  No focal disc herniations are demonstrated. There is no evidence of nerve root compression any level. There are no findings suspicious for discitis. No epidural hemorrhage is seen.  Bilateral renal pelvis calculi are demonstrated. The calculus on the left measures 1.5 cm on coronal image number 12. A calculus on the right measures 1.0 cm on image number 12 and a second calculus on the right measures 0.3 cm on image 13. Interval mild left hydronephrosis.  IMPRESSION: 1. No evidence of discitis at any level. 2. No epidural hemorrhage seen at any level. 3. Degenerative and old posttraumatic changes, as described above. 4. No disc herniations are neural compression at any level. 5. Bilateral renal staghorn calculi with associated interval mild left hydronephrosis.   Electronically Signed   By: Claudie Revering M.D.   On: 11/11/2014 14:01    MDM  Patient unable to have MRI scan as has aicd. CT scan with  contrast lumbar spine oriented to check for infectious etiology of pain. CT scan and test results discussed at length with the patient and husband. Pain felt to be musculoskeletal in etiology. Renal insufficiency is chronic  Plan prescription Norco. Patient advised to discontinue Valium if taking hydrocodone and urine sent for culture Diagnosis #1 low back pain #2 urinary tract infection Final diagnoses:  None   #3 chronic renal insufficiency     Orlie Dakin, MD 11/11/14 [place], MD 11/11/14 1740

## 2014-11-11 NOTE — Discharge Instructions (Signed)
Take Tylenol for mild pain or the pain medicine prescribed today for bad pain. Don't take Tylenol together with the pain medicine prescribed today (hydrocodone-apap) as the combination to be dangerous. Don't take the pain medicine prescribed today with Valium as the combination can be harmful and interfere with breathing. Your urine has been sent for culture, to ensure that we have placed you on the correct antibiotic. You will be called if the change needs to be made. Call Dr.Holwerta she continued have significant pain or needs medication refills. Return if you feel worse for any reason.

## 2014-11-12 ENCOUNTER — Encounter: Payer: Self-pay | Admitting: Cardiology

## 2014-11-13 DIAGNOSIS — M810 Age-related osteoporosis without current pathological fracture: Secondary | ICD-10-CM | POA: Diagnosis not present

## 2014-11-13 DIAGNOSIS — S22080A Wedge compression fracture of T11-T12 vertebra, initial encounter for closed fracture: Secondary | ICD-10-CM | POA: Diagnosis not present

## 2014-11-13 DIAGNOSIS — H2511 Age-related nuclear cataract, right eye: Secondary | ICD-10-CM | POA: Diagnosis not present

## 2014-11-13 LAB — URINE CULTURE

## 2014-11-14 ENCOUNTER — Telehealth: Payer: Self-pay | Admitting: Internal Medicine

## 2014-11-14 NOTE — Telephone Encounter (Signed)
She has a compression fracture at T12 and was given pain medication.  It is okay to take and she is aware

## 2014-11-14 NOTE — Telephone Encounter (Signed)
New message      Is it ok for pt to take hydrocodone 5mg  with her heart medication?

## 2014-11-18 ENCOUNTER — Ambulatory Visit (INDEPENDENT_AMBULATORY_CARE_PROVIDER_SITE_OTHER): Payer: Medicare Other | Admitting: Pharmacist

## 2014-11-18 DIAGNOSIS — I4891 Unspecified atrial fibrillation: Secondary | ICD-10-CM

## 2014-11-18 DIAGNOSIS — I059 Rheumatic mitral valve disease, unspecified: Secondary | ICD-10-CM

## 2014-11-18 DIAGNOSIS — I359 Nonrheumatic aortic valve disorder, unspecified: Secondary | ICD-10-CM

## 2014-11-18 DIAGNOSIS — Z7901 Long term (current) use of anticoagulants: Secondary | ICD-10-CM

## 2014-11-18 LAB — POCT INR: INR: 4.3

## 2014-11-20 ENCOUNTER — Emergency Department (HOSPITAL_COMMUNITY)
Admission: EM | Admit: 2014-11-20 | Discharge: 2014-11-20 | Disposition: A | Discharge status: Home / Self Care | Payer: Medicare Other | Attending: Emergency Medicine | Admitting: Emergency Medicine

## 2014-11-20 ENCOUNTER — Encounter (HOSPITAL_COMMUNITY): Payer: Self-pay

## 2014-11-20 DIAGNOSIS — Z86018 Personal history of other benign neoplasm: Secondary | ICD-10-CM | POA: Insufficient documentation

## 2014-11-20 DIAGNOSIS — I5022 Chronic systolic (congestive) heart failure: Secondary | ICD-10-CM | POA: Diagnosis not present

## 2014-11-20 DIAGNOSIS — Z8673 Personal history of transient ischemic attack (TIA), and cerebral infarction without residual deficits: Secondary | ICD-10-CM | POA: Insufficient documentation

## 2014-11-20 DIAGNOSIS — S22080D Wedge compression fracture of T11-T12 vertebra, subsequent encounter for fracture with routine healing: Secondary | ICD-10-CM | POA: Insufficient documentation

## 2014-11-20 DIAGNOSIS — I10 Essential (primary) hypertension: Secondary | ICD-10-CM | POA: Diagnosis not present

## 2014-11-20 DIAGNOSIS — I2510 Atherosclerotic heart disease of native coronary artery without angina pectoris: Secondary | ICD-10-CM | POA: Diagnosis not present

## 2014-11-20 DIAGNOSIS — Z8742 Personal history of other diseases of the female genital tract: Secondary | ICD-10-CM | POA: Insufficient documentation

## 2014-11-20 DIAGNOSIS — Z87442 Personal history of urinary calculi: Secondary | ICD-10-CM | POA: Diagnosis not present

## 2014-11-20 DIAGNOSIS — Z79899 Other long term (current) drug therapy: Secondary | ICD-10-CM | POA: Diagnosis not present

## 2014-11-20 DIAGNOSIS — M1990 Unspecified osteoarthritis, unspecified site: Secondary | ICD-10-CM | POA: Insufficient documentation

## 2014-11-20 DIAGNOSIS — K219 Gastro-esophageal reflux disease without esophagitis: Secondary | ICD-10-CM | POA: Insufficient documentation

## 2014-11-20 DIAGNOSIS — E119 Type 2 diabetes mellitus without complications: Secondary | ICD-10-CM | POA: Insufficient documentation

## 2014-11-20 DIAGNOSIS — Z862 Personal history of diseases of the blood and blood-forming organs and certain disorders involving the immune mechanism: Secondary | ICD-10-CM | POA: Insufficient documentation

## 2014-11-20 DIAGNOSIS — S3992XD Unspecified injury of lower back, subsequent encounter: Secondary | ICD-10-CM | POA: Diagnosis present

## 2014-11-20 DIAGNOSIS — Z7901 Long term (current) use of anticoagulants: Secondary | ICD-10-CM | POA: Diagnosis not present

## 2014-11-20 DIAGNOSIS — X58XXXD Exposure to other specified factors, subsequent encounter: Secondary | ICD-10-CM | POA: Diagnosis not present

## 2014-11-20 DIAGNOSIS — S22000D Wedge compression fracture of unspecified thoracic vertebra, subsequent encounter for fracture with routine healing: Secondary | ICD-10-CM

## 2014-11-20 DIAGNOSIS — Z87891 Personal history of nicotine dependence: Secondary | ICD-10-CM | POA: Insufficient documentation

## 2014-11-20 MED ORDER — HYDROCODONE-ACETAMINOPHEN 5-325 MG PO TABS: 1.0000 | ORAL_TABLET | ORAL | Status: DC | PRN

## 2014-11-20 NOTE — Discharge Instructions (Signed)
COLACE: TAKE 1-2 TABLETS DAILY  MIRALAX: TAKE 1 CAPFUL 1-3 TIMES DAILY AS NEEDED TO HAVE SOFT STOOLS

## 2014-11-20 NOTE — ED Provider Notes (Signed)
CSN: [id number]     Arrival date & time 11/20/14  0902 History   First MD Initiated Contact with Patient 11/20/14 [phone]     Chief Complaint  Patient presents with  . Back Pain     (Consider location/radiation/quality/duration/timing/severity/associated sxs/prior Treatment) HPI Comments: 74 year old female with multiple medical problems listed below who presents with back pain. Patient reports that she began having back pain on September 2. On initial evaluation, she was diagnosed with muscle spasm but she later followed up with her orthopedic doctor and was diagnosed with a T12 compression fracture. She was given Lortab which she has been taking twice daily. Today she has only taken Tylenol. She reports that her pain has been severe, particularly when she tries to get up out of bed or moves around. The pain is minimal when she is lying still. No extremity numbness or weakness, no saddle anesthesia, no bowel/bladder incontinence. No fevers, cough/cold symptoms, or recent illness. No chest pain or shortness of breath. Patient does endorse constipation.  Patient is a 74 y.o. female presenting with back pain. The history is provided by the patient.  Back Pain   Past Medical History  Diagnosis Date  . Anemia   . Atrial fibrillation     A.fib/flutter: s/p MAZE 2008, DCCV 2011, on coumadin; previously on flecainide, but dc'd due to worsening EF. Tikosyn discontinued 06/2012 after cardiac arrest.  . Diverticulitis   . CVA (cerebral vascular accident)     2008 - felt due to oscillating calcium on aortic valve  . Carcinoma in situ of vulva   . Fibroid   . Ovarian cyst, left 2008-2009  . Osteoporosis   . Diabetes mellitus   . Hypertension   . GERD (gastroesophageal reflux disease)   . Chronic systolic CHF (congestive heart failure)     EF 35%  . Ischemic cardiomyopathy     EF 35%  . Coronary artery disease     Occluded LM 2/2 previous aortic root surgery; s/p 2v CABG 2010 LIMA to LAD, SVG to  OM; Cath 03/11/12 patent SVG to OM, atretic LIMA to LAD, patent RCA, occluded native LM, EF 35%   . Arthritis     OSTEO  . Fracture of lumbar spine   . 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  . Superficial venous thrombosis of upper extremity     06/2012 - LUE  . Severe aortic stenosis     s/p homograft aortic root replacement 2008  . Mitral regurgitation     s/p mitral valve repair 2008  . Thoracic aortic aneurysm     s/p resection and grafting 2008  . Stroke   . Kidney stones    Past Surgical History  Procedure Laterality Date  . Foot surgery Left     removed neuroma  . Aortic root replacement  2008    homograft  . Mitral valve replacement  2008    due to severe MR  . Resection and grafting of ascending thoracic aortic    . Aneurysm and left sided maze  2008  . Coronary artery bypass graft  4/10    LIMA to LAD, SVG to OM  . Wide excision of vulva      CA INSITU  . Cardiac defibrillator placement  06/08/2012    [facility] [place] DR  implanted by Dr Rayann Heman following a cardiac arrest  .  Right heart catheterization N/A 04/03/2012    Procedure: RIGHT HEART CATH;  Surgeon: [name], MD;  Location: [facility] CATH LAB;  Service: Cardiovascular;  Laterality: N/A;  . Implantable cardioverter defibrillator implant N/A 06/08/2012    Procedure: IMPLANTABLE CARDIOVERTER DEFIBRILLATOR IMPLANT;  Surgeon: Thompson Grayer, MD;  Location: [facility] CATH LAB;  Service: Cardiovascular;  Laterality: N/A;  . Cataract extraction     Family History  Problem Relation Age of Onset  . Hypertension Mother   . Breast cancer Maternal Aunt     age 32's  . Colon cancer Paternal Grandmother   . Diabetes Maternal Uncle   . Diabetes Paternal Uncle   . Emphysema Father    Social History  Substance Use Topics  . Smoking status: Former Smoker     Types: Cigarettes    Quit date: 03/09/1983  . Smokeless tobacco: Never Used  . Alcohol Use: No   OB History    Gravida Para Term Preterm AB TAB SAB Ectopic Multiple Living   0              Review of Systems  Musculoskeletal: Positive for back pain.   10 Systems reviewed and are negative for acute change except as noted in the HPI.    Allergies  Metformin and related; Tikosyn; and Caffeine  Home Medications   Prior to Admission medications   Medication Sig Start Date End Date Taking? Authorizing Provider  acetaminophen (TYLENOL) 500 MG tablet Take 1,000 mg by mouth every 6 (six) hours as needed for mild pain.   Yes Historical Provider, MD  amiodarone (PACERONE) 200 MG tablet Take 1 tablet (200 mg total) by mouth daily. 12/31/13  Yes Thompson Grayer, MD  calcitonin, salmon, (MIACALCIN/FORTICAL) 200 UNIT/ACT nasal spray Place 1 spray into alternate nostrils daily. 11/13/14  Yes Historical Provider, MD  carvedilol (COREG) 12.5 MG tablet Take 1 tablet (12.5 mg total) by mouth 2 (two) times daily with a meal. 03/14/14  Yes Thompson Grayer, MD  DUREZOL 0.05 % EMUL Place 1 drop into the right eye as directed. Tapering off post surgery 10/27/14  Yes Historical Provider, MD  furosemide (LASIX) 20 MG tablet TAKE 1 TABLET BY MOUTH EVERY DAY 04/16/14  Yes Lelon Perla, MD  ILEVRO 0.3 % ophthalmic suspension Place 1 drop into the right eye at bedtime. 10/27/14  Yes Historical Provider, MD  linagliptin (TRADJENTA) 5 MG TABS tablet Take 1 tablet (5 mg total) by mouth daily. 04/17/13  Yes Lelon Perla, MD  losartan (COZAAR) 50 MG tablet Take 1 tablet (50 mg total) by mouth daily. 05/22/14  Yes Lelon Perla, MD  NON FORMULARY ([employer]) Take 500 mg of elemental calcium by mouth at bedtime   Yes Historical Provider, MD  omeprazole (PRILOSEC OTC) 20 MG tablet Take 20 mg by mouth daily before breakfast.    Yes Historical Provider, MD  Psyllium (METAMUCIL PO) Take 1 teaspoon by mouth daily with water   Yes  Historical Provider, MD  warfarin (COUMADIN) 3 MG tablet TAKE 0.5-1 TABLETS (1.5-3 MG TOTAL) BY MOUTH DAILY. Patient taking differently: Take 1.5 mg on Mon, Wed, Fri, and Saturday. Take  3mg  on Sherrlyn Hock, Sunday 09/17/14  Yes Lelon Perla, MD  BESIVANCE 0.6 % SUSP Place 1 drop into the right eye as directed. Tapering off after surgery 10/27/14   Historical Provider, MD  cephALEXin (KEFLEX) 500 MG capsule Take 1 capsule (500 mg total) by mouth 3 (three) times daily. Patient not taking: Reported on 11/20/2014  11/11/14   Orlie Dakin, MD  Cyanocobalamin (VITAMIN B-12) 2500 MCG SUBL Place 1 tablet under the tongue every Monday, Wednesday, and Friday.     Historical Provider, MD  denosumab (PROLIA) 60 MG/ML SOLN injection Inject 60 mg into the skin every 6 (six) months. Administer in upper arm, thigh, or abdomen    Historical Provider, MD  diazepam (VALIUM) 5 MG tablet Take 5 mg by mouth every 8 (eight) hours as needed for anxiety.    Historical Provider, MD  ergocalciferol (VITAMIN D2) 50000 UNITS capsule Take 50,000 Units by mouth once a week. Monday    Historical Provider, MD  HYDROcodone-acetaminophen (NORCO/VICODIN) 5-325 MG per tablet Take 1-2 tablets by mouth every 4 (four) hours as needed for severe pain. 11/20/14   Wenda Overland Elisha Cooksey, MD   BP 129/55 mmHg  Pulse 63  Temp(Src) 97.9 F (36.6 C) (Oral)  Resp 18  Ht 5\' 3"  (1.6 m)  Wt 175 lb (79.379 kg)  BMI 31.01 kg/m2  SpO2 98% Physical Exam  Constitutional: She is oriented to person, place, and time. She appears well-developed and well-nourished. No distress.  HENT:  Head: Normocephalic and atraumatic.  Moist mucous membranes  Eyes: Conjunctivae are normal. Pupils are equal, round, and reactive to light.  Neck: Neck supple.  Cardiovascular: Normal rate, regular rhythm and normal heart sounds.   No murmur heard. Pulmonary/Chest: Effort normal and breath sounds normal.  Abdominal: Soft. Bowel sounds are normal. She exhibits no  distension. There is no tenderness.  Musculoskeletal: She exhibits no edema.  Severe kyphosis of thoracic spine, no midline spinal tenderness or step off, no erythema or swelling  Neurological: She is alert and oriented to person, place, and time. She has normal reflexes. She exhibits normal muscle tone.  5/5 strength and normal sensation bilateral lower extremities  Skin: Skin is warm and dry.  Psychiatric: She has a normal mood and affect. Judgment normal.  Nursing note and vitals reviewed.   ED Course  Procedures (including critical care time) Labs Review Labs Reviewed - No data to display   MDM   Final diagnoses:  Thoracic compression fracture, with routine healing, subsequent encounter    74 year old female who presents with ongoing back pain in the setting of recent diagnosis of T12 compression fracture. Patient well-appearing with reassuring vital signs at presentation. No reports of fever or other infectious symptoms. No abnormalities noted on exam and patient had no midline tenderness to palpation. No neurologic deficits or complaints of neurologic symptoms to suggest acute process involving the spinal cord. I reviewed the patient's recent imaging in her chart which does note a T12 superior endplate compression fracture with 15% loss of height. The patient has been taking pain medication only twice a day and I suspect that she has been under dosed for pain relief. I have encouraged her to maintain a schedule every 4-6 hours and titrate medications based on pain. I have also encouraged her to take a nighttime dose as much of her pain seems to be overnight. Also encouraged to begin Colace and MiraLAX for her constipation likely related to opiate use. Cautioned on narcotics causing unsteadiness given that she is on warfarin. She already has an orthopedic doctor with whom she can follow-up and I have encouraged her to schedule a follow-up appointment within the next week. Return  precautions reviewed. All questions answered. Patient discharged in satisfactory condition.  Sharlett Iles, MD 11/20/14 [phone]

## 2014-11-20 NOTE — ED Notes (Signed)
Pt reports she began having back pain on Sept 2. Pt was seen at [facility]. Pt was also seen at [facility] and was diagnosed with a pulled muscle. Pt seen again by orthopedist and was dx with a T-12 compression fx. Pt has severe pain when trying to get out of the bed or moving around. Pt MAE.

## 2014-11-21 ENCOUNTER — Other Ambulatory Visit: Payer: Self-pay | Admitting: Cardiology

## 2014-11-22 ENCOUNTER — Other Ambulatory Visit: Payer: Self-pay | Admitting: Sports Medicine

## 2014-11-22 DIAGNOSIS — S22000A Wedge compression fracture of unspecified thoracic vertebra, initial encounter for closed fracture: Secondary | ICD-10-CM

## 2014-11-25 ENCOUNTER — Other Ambulatory Visit (HOSPITAL_COMMUNITY): Payer: Self-pay | Admitting: Sports Medicine

## 2014-11-25 DIAGNOSIS — T148XXA Other injury of unspecified body region, initial encounter: Secondary | ICD-10-CM

## 2014-11-26 ENCOUNTER — Ambulatory Visit (INDEPENDENT_AMBULATORY_CARE_PROVIDER_SITE_OTHER): Payer: Medicare Other

## 2014-11-26 DIAGNOSIS — I255 Ischemic cardiomyopathy: Secondary | ICD-10-CM

## 2014-11-26 DIAGNOSIS — Z95810 Presence of automatic (implantable) cardiac defibrillator: Secondary | ICD-10-CM | POA: Diagnosis not present

## 2014-11-26 NOTE — Progress Notes (Signed)
EPIC Encounter for ICM Monitoring  Patient Name: [name] is a 74 y.o. female Date: 11/26/2014 Primary Care Physican: Velna Hatchet, MD Primary Cardiologist: Stanford Breed Electrophysiologist: Allred Dry Weight: 176 lbs       In the past month, have you:  1. Gained more than 2 pounds in a day or more than 5 pounds in a week? no  2. Had changes in your medications (with verification of current medications)? no  3. Had more shortness of breath than is usual for you? no  4. Limited your activity because of shortness of breath? no  5. Not been able to sleep because of shortness of breath? no  6. Had increased swelling in your feet or ankles? no  7. Had symptoms of dehydration (dizziness, dry mouth, increased thirst, decreased urine output) no  8. Had changes in sodium restriction? no  9. Been compliant with medication? Yes   ICM trend:   Follow-up plan: ICM clinic phone appointment 01/01/2015. Corvue impedance trending above baseline since ~11/15/2014 and patient stated she has not been drinking enough fluids.  She has been taking pain med, for Thoracic fracture, that causes some nausea so she has not been drinking a lot. Education given to increase fluids to decrease risk of dehydration.     Patient's main problem is her recent Thoracic fracture, T-12.  She will be seeing Neurosurgeon 11/27/2014 for possible Vertebroplasty.  She is asking for recommendation if she should stop Coumadin for the procedure.  Advised the Neurosurgeon's office will send a request for pre-op clearance if the surgery needs to be done and since she is followed by the Coumadin clinic in this office, the pharmacist will provide instructions regarding the Coumadin before a procedure.  She is scheduled 11/28/2014 for Nuclear Med Bone Scan Whole Body.          Copy of note sent to patient's primary care physician, primary cardiologist, and device following physician.  Rosalene Billings, RN, CCM 11/26/2014 11:30  AM

## 2014-11-27 DIAGNOSIS — S22080A Wedge compression fracture of T11-T12 vertebra, initial encounter for closed fracture: Secondary | ICD-10-CM | POA: Diagnosis not present

## 2014-11-27 DIAGNOSIS — Z6829 Body mass index (BMI) 29.0-29.9, adult: Secondary | ICD-10-CM | POA: Diagnosis not present

## 2014-11-29 ENCOUNTER — Encounter (HOSPITAL_COMMUNITY)
Admission: RE | Admit: 2014-11-29 | Discharge: 2014-11-29 | Disposition: A | Discharge status: Home / Self Care | Payer: Medicare Other | Source: Ambulatory Visit | Attending: Sports Medicine | Admitting: Sports Medicine

## 2014-11-29 DIAGNOSIS — M545 Low back pain: Secondary | ICD-10-CM | POA: Diagnosis not present

## 2014-11-29 DIAGNOSIS — X58XXXA Exposure to other specified factors, initial encounter: Secondary | ICD-10-CM | POA: Insufficient documentation

## 2014-11-29 DIAGNOSIS — T148XXA Other injury of unspecified body region, initial encounter: Secondary | ICD-10-CM

## 2014-11-29 DIAGNOSIS — R948 Abnormal results of function studies of other organs and systems: Secondary | ICD-10-CM | POA: Diagnosis not present

## 2014-11-29 DIAGNOSIS — S2243XA Multiple fractures of ribs, bilateral, initial encounter for closed fracture: Secondary | ICD-10-CM | POA: Diagnosis not present

## 2014-11-29 MED ORDER — TECHNETIUM TC 99M MEDRONATE IV KIT: 23.3000 | PACK | Freq: Once | INTRAVENOUS | Status: DC | PRN

## 2014-12-02 ENCOUNTER — Ambulatory Visit (INDEPENDENT_AMBULATORY_CARE_PROVIDER_SITE_OTHER): Payer: Medicare Other | Admitting: *Deleted

## 2014-12-02 DIAGNOSIS — I4891 Unspecified atrial fibrillation: Secondary | ICD-10-CM | POA: Diagnosis not present

## 2014-12-02 DIAGNOSIS — I359 Nonrheumatic aortic valve disorder, unspecified: Secondary | ICD-10-CM

## 2014-12-02 DIAGNOSIS — I059 Rheumatic mitral valve disease, unspecified: Secondary | ICD-10-CM | POA: Diagnosis not present

## 2014-12-02 DIAGNOSIS — Z7901 Long term (current) use of anticoagulants: Secondary | ICD-10-CM | POA: Diagnosis not present

## 2014-12-02 LAB — POCT INR: INR: 3.4

## 2014-12-04 ENCOUNTER — Other Ambulatory Visit (HOSPITAL_COMMUNITY): Payer: Self-pay | Admitting: Neurosurgery

## 2014-12-04 ENCOUNTER — Ambulatory Visit (INDEPENDENT_AMBULATORY_CARE_PROVIDER_SITE_OTHER): Payer: Medicare Other | Admitting: *Deleted

## 2014-12-04 DIAGNOSIS — S22080A Wedge compression fracture of T11-T12 vertebra, initial encounter for closed fracture: Secondary | ICD-10-CM | POA: Diagnosis not present

## 2014-12-04 DIAGNOSIS — I359 Nonrheumatic aortic valve disorder, unspecified: Secondary | ICD-10-CM | POA: Diagnosis not present

## 2014-12-04 DIAGNOSIS — I4891 Unspecified atrial fibrillation: Secondary | ICD-10-CM

## 2014-12-04 DIAGNOSIS — Z7901 Long term (current) use of anticoagulants: Secondary | ICD-10-CM | POA: Diagnosis not present

## 2014-12-04 DIAGNOSIS — Z6829 Body mass index (BMI) 29.0-29.9, adult: Secondary | ICD-10-CM | POA: Diagnosis not present

## 2014-12-04 DIAGNOSIS — I059 Rheumatic mitral valve disease, unspecified: Secondary | ICD-10-CM | POA: Diagnosis not present

## 2014-12-04 LAB — POCT INR: INR: 2.6

## 2014-12-04 MED ORDER — ENOXAPARIN SODIUM 80 MG/0.8ML ~~LOC~~ SOLN: 80.0000 mg | Freq: Two times a day (BID) | SUBCUTANEOUS | Status: DC

## 2014-12-04 NOTE — Patient Instructions (Addendum)
12/05/14- Take last dose on this date  12/06/14- Do Nothing   12/07/14- Start Lovenox 80mg s injection into fatty abdominal tissue 2 inches away from navel, rotate sites at 8am and 8pm.  12/08/14- Continue Lovenox 80mg s injection into fatty abdominal tissue 2 inches away from navel, rotate sites at 8am and 8pm.   12/09/14- Continue Lovenox 80mg s injection into fatty abdominal tissue 2 inches away from navel, rotate sites at 8am and 8pm.   12/10/14- Take last Lovenox 80mg s injection into fatty abdominal tissue 2 inches away from navel at 8am. Do no more injection until after surgery.   12/11/14- Day of procedure, report to Hospital   ** Restart Coumadin night of procedure once at home if ok per Dr Hal Neer,   12/12/14- Restart Lovenox 80mg s injection into fatty abdominal tissue 2 inches away from navel, rotate sites at 8am and 8pm, Continue both Coumadin and Lovenox injections until follow up appt with Coumadin Clinic 12/16/14 @ 2:15pm.

## 2014-12-09 ENCOUNTER — Encounter (HOSPITAL_COMMUNITY)
Admission: RE | Admit: 2014-12-09 | Discharge: 2014-12-09 | Disposition: A | Discharge status: Home / Self Care | Payer: Medicare Other | Source: Ambulatory Visit | Attending: Neurosurgery | Admitting: Neurosurgery

## 2014-12-09 ENCOUNTER — Encounter (HOSPITAL_COMMUNITY): Payer: Self-pay

## 2014-12-09 DIAGNOSIS — I5022 Chronic systolic (congestive) heart failure: Secondary | ICD-10-CM | POA: Diagnosis not present

## 2014-12-09 DIAGNOSIS — I10 Essential (primary) hypertension: Secondary | ICD-10-CM | POA: Diagnosis not present

## 2014-12-09 DIAGNOSIS — I2510 Atherosclerotic heart disease of native coronary artery without angina pectoris: Secondary | ICD-10-CM | POA: Diagnosis not present

## 2014-12-09 DIAGNOSIS — Z951 Presence of aortocoronary bypass graft: Secondary | ICD-10-CM | POA: Diagnosis not present

## 2014-12-09 DIAGNOSIS — Z95810 Presence of automatic (implantable) cardiac defibrillator: Secondary | ICD-10-CM | POA: Diagnosis not present

## 2014-12-09 DIAGNOSIS — Z7901 Long term (current) use of anticoagulants: Secondary | ICD-10-CM | POA: Diagnosis not present

## 2014-12-09 DIAGNOSIS — I4891 Unspecified atrial fibrillation: Secondary | ICD-10-CM | POA: Diagnosis not present

## 2014-12-09 DIAGNOSIS — M8008XA Age-related osteoporosis with current pathological fracture, vertebra(e), initial encounter for fracture: Secondary | ICD-10-CM | POA: Diagnosis not present

## 2014-12-09 DIAGNOSIS — Z8673 Personal history of transient ischemic attack (TIA), and cerebral infarction without residual deficits: Secondary | ICD-10-CM | POA: Diagnosis not present

## 2014-12-09 DIAGNOSIS — E119 Type 2 diabetes mellitus without complications: Secondary | ICD-10-CM | POA: Diagnosis not present

## 2014-12-09 DIAGNOSIS — Z87891 Personal history of nicotine dependence: Secondary | ICD-10-CM | POA: Diagnosis not present

## 2014-12-09 HISTORY — DX: Cardiac arrhythmia, unspecified: I49.9

## 2014-12-09 HISTORY — DX: Presence of automatic (implantable) cardiac defibrillator: Z95.810

## 2014-12-09 LAB — BASIC METABOLIC PANEL
Anion gap: 10 (ref 5–15)
BUN: 32 mg/dL — AB (ref 6–20)
CALCIUM: 8.8 mg/dL — AB (ref 8.9–10.3)
CO2: 27 mmol/L (ref 22–32)
CREATININE: 1.72 mg/dL — AB (ref 0.44–1.00)
Chloride: 101 mmol/L (ref 101–111)
GFR calc Af Amer: 33 mL/min — ABNORMAL LOW (ref >60)
GFR calc non Af Amer: 28 mL/min — ABNORMAL LOW (ref >60)
GLUCOSE: 200 mg/dL — AB (ref 65–99)
Potassium: 3.8 mmol/L (ref 3.5–5.1)
Sodium: 138 mmol/L (ref 135–145)

## 2014-12-09 LAB — CBC
HEMATOCRIT: 40 % (ref 36.0–46.0)
Hemoglobin: 12.5 g/dL (ref 12.0–15.0)
MCH: 30.1 pg (ref 26.0–34.0)
MCHC: 31.3 g/dL (ref 30.0–36.0)
MCV: 96.4 fL (ref 78.0–100.0)
Platelets: 346 10*3/uL (ref 150–400)
RBC: 4.15 MIL/uL (ref 3.87–5.11)
RDW: 13.9 % (ref 11.5–15.5)
WBC: 11.5 10*3/uL — ABNORMAL HIGH (ref 4.0–10.5)

## 2014-12-09 LAB — GLUCOSE, CAPILLARY: Glucose-Capillary: 194 mg/dL — ABNORMAL HIGH (ref 65–99)

## 2014-12-09 LAB — SURGICAL PCR SCREEN
MRSA, PCR: NEGATIVE
Staphylococcus aureus: NEGATIVE

## 2014-12-09 LAB — PROTIME-INR
INR: 1.7 — ABNORMAL HIGH (ref 0.00–1.49)
Prothrombin Time: 20 seconds — ABNORMAL HIGH (ref 11.6–15.2)

## 2014-12-09 LAB — APTT: aPTT: 42 seconds — ABNORMAL HIGH (ref 24–37)

## 2014-12-09 NOTE — Progress Notes (Signed)
[name]  With Heard notified of surgery. Also pt instructed on coumadin and bridge per Dr Rayann Heman

## 2014-12-09 NOTE — Pre-Procedure Instructions (Signed)
    [name]  12/09/2014      [id number] - [place], [place] [address] [place] [address] Phone: [phone] Fax: [place] [facility] - [place], [place] - [address] [place] [address] Phone: [phone] Fax: [phone]    Your procedure is scheduled on 12/11/14.  Report to [facility] Admitting at 630 A.M.  Call this number if you have problems the morning of surgery:  [phone]   Remember:  Do not eat food or drink liquids after midnight.  Take these medicines the morning of surgery with A SIP OF WATER --tylenol,amiodarone,cardedilol,valium,hydrocodone,prilosec   Do not wear jewelry, make-up or nail polish.  Do not wear lotions, powders, or perfumes.  You may wear deodorant.  Do not shave 48 hours prior to surgery.  Men may shave face and neck.  Do not bring valuables to the hospital.  [facility] is not responsible for any belongings or valuables.  Contacts, dentures or bridgework may not be worn into surgery.  Leave your suitcase in the car.  After surgery it may be brought to your room.  For patients admitted to the hospital, discharge time will be determined by your treatment team.  Patients discharged the day of surgery will not be allowed to drive home.   Name and phone number of your driver:    Special instructions:   Please read over the following fact sheets that you were given. Pain Booklet, Coughing and Deep Breathing, MRSA Information and Surgical Site Infection Prevention

## 2014-12-09 NOTE — Progress Notes (Addendum)
Anesthesia Chart Review: Patient is a 74 year old female scheduled for T12 kyphoplasty on 12/11/14 by Dr. Hal Neer.  History includes remote former smoker, anemia, afib/flutter s/p MAZE '08 with DCCV '11 (started on Tikosyn,04/2012 but discontinued after vfib cardiac arrest 05/18/12 s/p St[employer] 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  PCP is Dr. Velna Hatchet. Primary cardiologist is Dr. Stanford Breed, last visit 03/25/14. EP cardiologist is Dr. Rayann Heman, last visit 07/15/14 with one year follow-up recommended and monthly ICM RN visits--last CorVue Impedance monitoring visit 11/26/14. Impedance was trending above baseline, and patient had decreased po intake due to nausea associated with pain medication for thoracic fracture. She was advised to increase fluids to decrease risk of dehydration. [facility] has outlined peri-operative anticoagulation instruction (see 12/04/14 Muse, RN note). CT surgeon is Dr. Roxy Manns.   Meds include amiodarone, Coreg, Prolia (held this month due to surgery), Valium, warfarin (plan Lovenox bridge while on hold pre-op), Lasix, Norco, Tradjenta, losartan, Prilosec.   07/15/14 EKG ([facility]): A-paced rhythm, septal infarct (age undetermined).  04/16/13 Echo: Study Conclusions - Left ventricle: The cavity size was normal. There was mild concentric hypertrophy. Systolic function was mildly to moderately reduced. The estimated ejection fraction was in the range of 40% to 45%. Features are consistent with a pseudonormal left ventricular filling pattern, with concomitant abnormal relaxation and increased  filling pressure (grade 2 diastolic dysfunction). - Aortic valve: A bioprosthesis was present. Mild regurgitation. It was not possible to determine whether the regurgitation was valvular or perivalvular. - Mitral valve: An annular ring prosthesis was present. - Left atrium: The atrium was mildly dilated. - Right ventricle: The cavity size was moderately dilated. Systolic function was moderately to severely reduced. - Atrial septum: No defect or patent foramen ovale was identified. - Pulmonary arteries: PA peak pressure: 66mm Hg (S). Impressions: - The right ventricular systolic pressure was increased consistent with mild pulmonary hypertension. (EF previously 35-40% by 12/31/11 echo.)  03/21/12 LHC: Final Conclusions:  1. Total occlusion of the native left mainstem. 2. Wide patency of the right coronary artery. 3. Atretic LIMA to LAD. 4. Widely patent saphenous vein graft to obtuse marginal supplying all branches of the left coronary artery. 5. Severe segmental left ventricular systolic dysfunction, LVEF estimated at 35%. (LHC complicated by right femoral pseudoaneurysm s/p successful U/S compression 04/03/12.)  03/27/13 CXR: FINDINGS: Cardiac shadow remain enlarged. Stable support apparatus and postsurgical changes. The lungs are clear without focal infiltrate or sizable effusion. No acute bony abnormality is noted. IMPRESSION: No acute abnormality seen.  Preoperative labs noted. Cr 1.72, BUN 32, up from 1.50 and 21 on 11/11/14 (previous Cr ~ 1.4-1.5 since 10/2012). INR 1.70. PTT 42. Glucose 200. A1C was not done with PAT labs.   She has had EP and general cardiology follow-up within the year. [facility] Cou[facility] has provided perioperative anti-coagulation instructions. Cr is a little above baseline (had recently reported decrease po intake due to nausea from pain meds). Will check an ISTAT8 to check for stability and get repeat PT/PTT on the day of surgery. Chart will  be left for follow-up regarding perioperative ICD RX from Dr. Rayann Heman.  Myra Gianotti, PA-C [facility]  Short Stay Center/Anesthesiology Phone [phone] 12/09/2014 6:09 PM  Addendum: ICD RX form received. EP Staff recommended Magnet to be placed over device during the procedure. However, patient will be in the prone position, making securing a magnet difficult. Discussed this matter with anesthesiologist Dr. Kalman Shan who agreed that [facility]. Jude rep will need to notified to deactivate device as appropriate preoperatively. I will also send form back to the EP clinic.  Chart left for nursing staff to contact St. Jude Rep. Aaron Edelman was at least notified of plans for surgery yesterday.)  [facility] Short Stay Center/Anesthesiology Phone [phone] 12/10/2014 2:08 PM

## 2014-12-10 ENCOUNTER — Telehealth: Payer: Self-pay | Admitting: Internal Medicine

## 2014-12-10 MED ORDER — DEXAMETHASONE SODIUM PHOSPHATE 10 MG/ML IJ SOLN
10.0000 mg | INTRAMUSCULAR | Status: AC
  Administered 2014-12-11: 10 mg via INTRAVENOUS
  Filled 2014-12-10: qty 1

## 2014-12-10 MED ORDER — CEFAZOLIN SODIUM-DEXTROSE 2-3 GM-% IV SOLR
2.0000 g | INTRAVENOUS | Status: AC
  Administered 2014-12-11: 2 g via INTRAVENOUS
  Filled 2014-12-10: qty 50

## 2014-12-10 NOTE — Telephone Encounter (Signed)
New message      Talk to Kelly-----pt would not tell me what she wanted.

## 2014-12-10 NOTE — Telephone Encounter (Signed)
Patient is having a KYPHOPLASTY tomorrow and wants to make sure we have the form for her device and have let them know what to do.  I let her know I would find out and call her back

## 2014-12-10 NOTE — Telephone Encounter (Signed)
Form has been filled out and signed and sent back.  Patient aware

## 2014-12-11 ENCOUNTER — Ambulatory Visit (HOSPITAL_COMMUNITY): Payer: Medicare Other | Admitting: Vascular Surgery

## 2014-12-11 ENCOUNTER — Ambulatory Visit (HOSPITAL_COMMUNITY): Payer: Medicare Other

## 2014-12-11 ENCOUNTER — Ambulatory Visit (HOSPITAL_COMMUNITY)
Admission: RE | Admit: 2014-12-11 | Discharge: 2014-12-11 | Disposition: A | Discharge status: Home / Self Care | Payer: Medicare Other | Source: Ambulatory Visit | Attending: Neurosurgery | Admitting: Neurosurgery

## 2014-12-11 ENCOUNTER — Ambulatory Visit (HOSPITAL_COMMUNITY): Payer: Medicare Other | Admitting: Anesthesiology

## 2014-12-11 ENCOUNTER — Encounter (HOSPITAL_COMMUNITY)
Admission: RE | Disposition: A | Discharge status: Home / Self Care | Payer: Self-pay | Source: Ambulatory Visit | Attending: Neurosurgery

## 2014-12-11 ENCOUNTER — Encounter (HOSPITAL_COMMUNITY): Payer: Self-pay | Admitting: *Deleted

## 2014-12-11 DIAGNOSIS — Z7901 Long term (current) use of anticoagulants: Secondary | ICD-10-CM | POA: Insufficient documentation

## 2014-12-11 DIAGNOSIS — I10 Essential (primary) hypertension: Secondary | ICD-10-CM | POA: Diagnosis not present

## 2014-12-11 DIAGNOSIS — M8008XA Age-related osteoporosis with current pathological fracture, vertebra(e), initial encounter for fracture: Secondary | ICD-10-CM | POA: Diagnosis not present

## 2014-12-11 DIAGNOSIS — I4891 Unspecified atrial fibrillation: Secondary | ICD-10-CM | POA: Insufficient documentation

## 2014-12-11 DIAGNOSIS — K219 Gastro-esophageal reflux disease without esophagitis: Secondary | ICD-10-CM | POA: Diagnosis not present

## 2014-12-11 DIAGNOSIS — I2510 Atherosclerotic heart disease of native coronary artery without angina pectoris: Secondary | ICD-10-CM | POA: Insufficient documentation

## 2014-12-11 DIAGNOSIS — Z9889 Other specified postprocedural states: Secondary | ICD-10-CM | POA: Diagnosis not present

## 2014-12-11 DIAGNOSIS — Z95810 Presence of automatic (implantable) cardiac defibrillator: Secondary | ICD-10-CM | POA: Insufficient documentation

## 2014-12-11 DIAGNOSIS — M549 Dorsalgia, unspecified: Secondary | ICD-10-CM

## 2014-12-11 DIAGNOSIS — Z87891 Personal history of nicotine dependence: Secondary | ICD-10-CM | POA: Insufficient documentation

## 2014-12-11 DIAGNOSIS — S22080A Wedge compression fracture of T11-T12 vertebra, initial encounter for closed fracture: Secondary | ICD-10-CM | POA: Diagnosis not present

## 2014-12-11 DIAGNOSIS — Z951 Presence of aortocoronary bypass graft: Secondary | ICD-10-CM | POA: Diagnosis not present

## 2014-12-11 DIAGNOSIS — E119 Type 2 diabetes mellitus without complications: Secondary | ICD-10-CM | POA: Insufficient documentation

## 2014-12-11 DIAGNOSIS — Z8673 Personal history of transient ischemic attack (TIA), and cerebral infarction without residual deficits: Secondary | ICD-10-CM | POA: Insufficient documentation

## 2014-12-11 DIAGNOSIS — D649 Anemia, unspecified: Secondary | ICD-10-CM | POA: Diagnosis not present

## 2014-12-11 DIAGNOSIS — I5022 Chronic systolic (congestive) heart failure: Secondary | ICD-10-CM | POA: Insufficient documentation

## 2014-12-11 HISTORY — PX: KYPHOPLASTY: SHX5884

## 2014-12-11 LAB — POCT I-STAT, CHEM 8
BUN: 34 mg/dL — AB (ref 6–20)
CREATININE: 1.6 mg/dL — AB (ref 0.44–1.00)
Calcium, Ion: 1.15 mmol/L (ref 1.13–1.30)
Chloride: 99 mmol/L — ABNORMAL LOW (ref 101–111)
GLUCOSE: 164 mg/dL — AB (ref 65–99)
HEMATOCRIT: 41 % (ref 36.0–46.0)
HEMOGLOBIN: 13.9 g/dL (ref 12.0–15.0)
POTASSIUM: 3.8 mmol/L (ref 3.5–5.1)
Sodium: 139 mmol/L (ref 135–145)
TCO2: 28 mmol/L (ref 0–100)

## 2014-12-11 LAB — GLUCOSE, CAPILLARY
Glucose-Capillary: 145 mg/dL — ABNORMAL HIGH (ref 65–99)
Glucose-Capillary: 145 mg/dL — ABNORMAL HIGH (ref 65–99)

## 2014-12-11 LAB — APTT: aPTT: 29 seconds (ref 24–37)

## 2014-12-11 LAB — PROTIME-INR
INR: 1.38 (ref 0.00–1.49)
PROTHROMBIN TIME: 17 s — AB (ref 11.6–15.2)

## 2014-12-11 SURGERY — KYPHOPLASTY
Anesthesia: General | Site: Back

## 2014-12-11 MED ORDER — PROPOFOL 10 MG/ML IV BOLUS
INTRAVENOUS | Status: AC
  Filled 2014-12-11: qty 20

## 2014-12-11 MED ORDER — GLYCOPYRROLATE 0.2 MG/ML IJ SOLN
INTRAMUSCULAR | Status: DC | PRN
  Administered 2014-12-11: 0.4 mg via INTRAVENOUS

## 2014-12-11 MED ORDER — HYDROCODONE-ACETAMINOPHEN 5-325 MG PO TABS
ORAL_TABLET | ORAL | Status: DC
Start: 2014-12-11 — End: 2014-12-11
  Filled 2014-12-11: qty 1

## 2014-12-11 MED ORDER — SODIUM CHLORIDE 0.9 % IJ SOLN
INTRAMUSCULAR | Status: AC
  Filled 2014-12-11: qty 10

## 2014-12-11 MED ORDER — GLYCOPYRROLATE 0.2 MG/ML IJ SOLN
INTRAMUSCULAR | Status: AC
  Filled 2014-12-11: qty 2

## 2014-12-11 MED ORDER — ARTIFICIAL TEARS OP OINT
TOPICAL_OINTMENT | OPHTHALMIC | Status: AC
  Filled 2014-12-11: qty 3.5

## 2014-12-11 MED ORDER — HYDROCODONE-ACETAMINOPHEN 5-325 MG PO TABS
1.0000 | ORAL_TABLET | ORAL | Status: DC | PRN
  Administered 2014-12-11: 1 via ORAL

## 2014-12-11 MED ORDER — NEOSTIGMINE METHYLSULFATE 10 MG/10ML IV SOLN
INTRAVENOUS | Status: DC | PRN
  Administered 2014-12-11: 3 mg via INTRAVENOUS

## 2014-12-11 MED ORDER — PROPOFOL 10 MG/ML IV BOLUS
INTRAVENOUS | Status: DC | PRN
  Administered 2014-12-11: 100 mg via INTRAVENOUS

## 2014-12-11 MED ORDER — SODIUM CHLORIDE 0.9 % IV SOLN: 250.0000 mL | INTRAVENOUS | Status: DC

## 2014-12-11 MED ORDER — FENTANYL CITRATE (PF) 100 MCG/2ML IJ SOLN
INTRAMUSCULAR | Status: DC | PRN
  Administered 2014-12-11: 100 ug via INTRAVENOUS

## 2014-12-11 MED ORDER — ROCURONIUM BROMIDE 50 MG/5ML IV SOLN
INTRAVENOUS | Status: AC
  Filled 2014-12-11: qty 1

## 2014-12-11 MED ORDER — PHENYLEPHRINE 40 MCG/ML (10ML) SYRINGE FOR IV PUSH (FOR BLOOD PRESSURE SUPPORT)
PREFILLED_SYRINGE | INTRAVENOUS | Status: AC
  Filled 2014-12-11: qty 20

## 2014-12-11 MED ORDER — ONDANSETRON HCL 4 MG/2ML IJ SOLN
INTRAMUSCULAR | Status: AC
  Filled 2014-12-11: qty 2

## 2014-12-11 MED ORDER — 0.9 % SODIUM CHLORIDE (POUR BTL) OPTIME
TOPICAL | Status: DC | PRN
  Administered 2014-12-11: 1000 mL

## 2014-12-11 MED ORDER — ONDANSETRON HCL 4 MG/2ML IJ SOLN
INTRAMUSCULAR | Status: DC | PRN
  Administered 2014-12-11: 4 mg via INTRAVENOUS

## 2014-12-11 MED ORDER — SUCCINYLCHOLINE CHLORIDE 20 MG/ML IJ SOLN
INTRAMUSCULAR | Status: AC
  Filled 2014-12-11: qty 1

## 2014-12-11 MED ORDER — LIDOCAINE HCL (CARDIAC) 20 MG/ML IV SOLN
INTRAVENOUS | Status: DC | PRN
  Administered 2014-12-11: 100 mg via INTRAVENOUS

## 2014-12-11 MED ORDER — LIDOCAINE HCL (CARDIAC) 20 MG/ML IV SOLN
INTRAVENOUS | Status: AC
  Filled 2014-12-11: qty 5

## 2014-12-11 MED ORDER — BUPIVACAINE HCL (PF) 0.5 % IJ SOLN
INTRAMUSCULAR | Status: DC | PRN
  Administered 2014-12-11: 8 mL

## 2014-12-11 MED ORDER — LACTATED RINGERS IV SOLN
INTRAVENOUS | Status: DC | PRN
  Administered 2014-12-11: 08:00:00 via INTRAVENOUS

## 2014-12-11 MED ORDER — FENTANYL CITRATE (PF) 250 MCG/5ML IJ SOLN
INTRAMUSCULAR | Status: AC
  Filled 2014-12-11: qty 5

## 2014-12-11 MED ORDER — EPHEDRINE SULFATE 50 MG/ML IJ SOLN
INTRAMUSCULAR | Status: AC
  Filled 2014-12-11: qty 1

## 2014-12-11 MED ORDER — PHENYLEPHRINE HCL 10 MG/ML IJ SOLN
INTRAMUSCULAR | Status: DC | PRN
  Administered 2014-12-11: 120 ug via INTRAVENOUS
  Administered 2014-12-11 (×2): 80 ug via INTRAVENOUS
  Administered 2014-12-11: 120 ug via INTRAVENOUS
  Administered 2014-12-11: 80 ug via INTRAVENOUS
  Administered 2014-12-11: 120 ug via INTRAVENOUS

## 2014-12-11 MED ORDER — ROCURONIUM BROMIDE 100 MG/10ML IV SOLN
INTRAVENOUS | Status: DC | PRN
  Administered 2014-12-11: 30 mg via INTRAVENOUS

## 2014-12-11 SURGICAL SUPPLY — 35 items
BANDAGE ADH SHEER 1  50/CT (GAUZE/BANDAGES/DRESSINGS) ×6 IMPLANT
BLADE CLIPPER SURG (BLADE) IMPLANT
BLADE SURG 15 STRL LF DISP TIS (BLADE) ×1 IMPLANT
BLADE SURG 15 STRL SS (BLADE) ×3
DFINE STABILI-T FIRST FRACTURE KIT WITH POWERCURVE ×3 IMPLANT
DRAPE C-ARM 42X72 X-RAY (DRAPES) ×6 IMPLANT
DRAPE INCISE IOBAN 66X45 STRL (DRAPES) ×3 IMPLANT
DRAPE LAPAROTOMY 100X72X124 (DRAPES) ×3 IMPLANT
DRAPE PROXIMA HALF (DRAPES) ×3 IMPLANT
DURAPREP 26ML APPLICATOR (WOUND CARE) ×3 IMPLANT
GAUZE SPONGE 4X4 16PLY XRAY LF (GAUZE/BANDAGES/DRESSINGS) ×3 IMPLANT
GLOVE ECLIPSE 8.0 STRL XLNG CF (GLOVE) ×3 IMPLANT
GLOVE EXAM NITRILE LRG STRL (GLOVE) IMPLANT
GLOVE EXAM NITRILE XL STR (GLOVE) IMPLANT
GLOVE EXAM NITRILE XS STR PU (GLOVE) IMPLANT
GOWN STRL REUS W/ TWL LRG LVL3 (GOWN DISPOSABLE) ×1 IMPLANT
GOWN STRL REUS W/ TWL XL LVL3 (GOWN DISPOSABLE) ×3 IMPLANT
GOWN STRL REUS W/TWL 2XL LVL3 (GOWN DISPOSABLE) ×3 IMPLANT
GOWN STRL REUS W/TWL LRG LVL3 (GOWN DISPOSABLE) ×3
GOWN STRL REUS W/TWL XL LVL3 (GOWN DISPOSABLE) ×9
KIT AUGMENTATION STABILIT VERT ×3 IMPLANT
KIT BASIN OR (CUSTOM PROCEDURE TRAY) ×3 IMPLANT
KIT ROOM TURNOVER OR (KITS) ×3 IMPLANT
LIQUID BAND (GAUZE/BANDAGES/DRESSINGS) IMPLANT
NS IRRIG 1000ML POUR BTL (IV SOLUTION) ×3 IMPLANT
PACK EENT II TURBAN DRAPE (CUSTOM PROCEDURE TRAY) ×3 IMPLANT
PAD ARMBOARD 7.5X6 YLW CONV (MISCELLANEOUS) ×3 IMPLANT
SPECIMEN JAR SMALL (MISCELLANEOUS) IMPLANT
STAPLER SKIN PROX WIDE 3.9 (STAPLE) ×3 IMPLANT
SUT VIC AB 3-0 CP2 18 (SUTURE) IMPLANT
SUT VIC AB 3-0 SH 8-18 (SUTURE) IMPLANT
SYSTEM BONE CEMENT MIXING (KITS) ×1 IMPLANT
StabiliT ER Bone Cement and Saturate Mixing System ×3 IMPLANT
TOWEL OR 17X24 6PK STRL BLUE (TOWEL DISPOSABLE) ×3 IMPLANT
TOWEL OR 17X26 10 PK STRL BLUE (TOWEL DISPOSABLE) ×3 IMPLANT

## 2014-12-11 NOTE — Anesthesia Procedure Notes (Signed)
Procedure Name: Intubation Date/Time: 12/11/2014 8:50 AM Performed by: Barrington Ellison Pre-anesthesia Checklist: Patient identified, Emergency Drugs available, Suction available, Patient being monitored and Timeout performed Patient Re-evaluated:Patient Re-evaluated prior to inductionOxygen Delivery Method: Circle system utilized Preoxygenation: Pre-oxygenation with 100% oxygen Ventilation: Mask ventilation without difficulty Laryngoscope Size: Mac and 3 Grade View: Grade I Tube type: Oral Tube size: 7.0 mm Number of attempts: 1 Airway Equipment and Method: Stylet Placement Confirmation: ETT inserted through vocal cords under direct vision,  positive ETCO2 and breath sounds checked- equal and bilateral Secured at: 21 cm Tube secured with: Tape Dental Injury: Teeth and Oropharynx as per pre-operative assessment

## 2014-12-11 NOTE — Transfer of Care (Signed)
Immediate Anesthesia Transfer of Care Note  Patient: [name]  Procedure(s) Performed: Procedure(s) with comments: KYPHOPLASTY T-12 (N/A) - KYPHOPLASTY T-12  Patient Location: PACU  Anesthesia Type:General  Level of Consciousness: lethargic and responds to stimulation  Airway & Oxygen Therapy: Patient Spontanous Breathing and Patient connected to nasal cannula oxygen  Post-op Assessment: Report given to RN and Patient moving all extremities X 4  Post vital signs: Reviewed and stable  Last Vitals:  Filed Vitals:   12/11/14 0722  BP: 135/57  Pulse: 60  Temp: 36.8 C  Resp: 18    Complications: No apparent anesthesia complications

## 2014-12-11 NOTE — Anesthesia Postprocedure Evaluation (Signed)
  Anesthesia Post-op Note  Patient: [name]  Procedure(s) Performed: Procedure(s) (LRB): KYPHOPLASTY T-12 (N/A)  Patient Location: PACU  Anesthesia Type: General  Level of Consciousness: awake and alert   Airway and Oxygen Therapy: Patient Spontanous Breathing  Post-op Pain: mild  Post-op Assessment: Post-op Vital signs reviewed, Patient's Cardiovascular Status Stable, Respiratory Function Stable, Patent Airway and No signs of Nausea or vomiting  Last Vitals:  Filed Vitals:   12/11/14 1000  BP: 126/45  Pulse: 61  Temp:   Resp: 19    Post-op Vital Signs: stable   Complications: No apparent anesthesia complications Doing great post op, Rep has re-activated ICD defibrillator function

## 2014-12-11 NOTE — Anesthesia Preprocedure Evaluation (Addendum)
Anesthesia Evaluation  Patient identified by MRN, date of birth, ID band Patient awake    Reviewed: Allergy & Precautions, H&P , NPO status , Patient's Chart, lab work & pertinent test results  History of Anesthesia Complications Negative for: history of anesthetic complications  Airway Mallampati: I  TM Distance: >3 FB Neck ROM: full    Dental  (+) Partial Lower, Missing, Dental Advisory Given   Pulmonary former smoker,    Pulmonary exam normal breath sounds clear to auscultation       Cardiovascular hypertension, + CAD, + CABG, + Peripheral Vascular Disease and +CHF  Normal cardiovascular exam+ dysrhythmias + Cardiac Defibrillator  Rhythm:regular Rate:Normal  Echocardiogram - 04/2013 and showed an ejection fraction of [id number], grade 2 diastolic dysfunction, bioprosthetic aortic valve with mild aortic insufficiency, mild left atrial enlargement, moderate right ventricular enlargement with reduced function. Denies syncope, dyspnea, chest pain -Pt is s/p AVR, mitral annuloplasty, Maze procedure and then redo sternotomy for CABG of LIMA + 2 vein grafts which remain patent per 2014 cath -regularly sees cardiology and EP   Neuro/Psych CVA    GI/Hepatic Neg liver ROS, GERD  ,  Endo/Other  negative endocrine ROSdiabetes, Type 2  Renal/GU Renal disease     Musculoskeletal  (+) Arthritis ,   Abdominal   Peds  Hematology   Anesthesia Other Findings On amiodarone, long term coumadin bridged today with lovenox, INR 1.38 today  Patient has dual lead pacemaker and ICD, never been shocked, she is paced at minimum of 60 but her underlying rhythm is rate controlled a-fib vs. Sinus brady (likely due to amio and BB combination, her AV junction functions normally), A paced 50% of the time  Reproductive/Obstetrics negative OB ROS                        Anesthesia Physical Anesthesia Plan  ASA: III  Anesthesia  Plan: General   Post-op Pain Management:    Induction: Intravenous  Airway Management Planned: Oral ETT  Additional Equipment:   Intra-op Plan:   Post-operative Plan: Extubation in OR  Informed Consent: I have reviewed the patients History and Physical, chart, labs and discussed the procedure including the risks, benefits and alternatives for the proposed anesthesia with the patient or authorized representative who has indicated his/her understanding and acceptance.   Dental Advisory Given  Plan Discussed with: Anesthesiologist, CRNA and Surgeon  Anesthesia Plan Comments: (St Jude rep will need to de-activate ICD device in preop holding  Patient clearly has elevated risk of MACE per current AHA/ACC guidelines for any operative procedure needed given history of arrhythmia, systolic heart failure, elevated Creatinine, previous stroke, age.  )       Anesthesia Quick Evaluation

## 2014-12-11 NOTE — H&P (Signed)
[name] is an 74 y.o. female.   Chief Complaint: Low back pain HPI: The patient is a 42 old female who is evaluated in the office for severe low back pain of a few weeks duration. There is no inciting event and started quite suddenly. She went to a walk-in clinic where they thought she might have kidney stones versus possible muscular issue and she was given some medication. She got worse once the emergency room where CT scan was done. There were not told of any particular diagnosis minute followed up with orthopedist with the CT scan so that there was a T12 compression fracture. She was referred for evaluation and tried on conservative therapy. She cannot have an MRI scan due to the fact that she had a pacemaker. Because of lack of improvement with conservative therapy the patient requested surgery now comes for a T12 vertebroplasty. I had a long discussion with her regarding the risks and benefits of surgery which primary include bleeding and infection weakness and numbness persistent pain. She's had chest numerous questions and appears to understand. With this information in hand she has requested to proceed with surgery.  Past Medical History  Diagnosis Date  . Anemia   . Atrial fibrillation [facility])     A.fib/flutter: s/p MAZE 2008, DCCV 2011, on coumadin; previously on flecainide, but dc'd due to worsening EF. Tikosyn discontinued 06/2012 after cardiac arrest.  . Diverticulitis   . CVA (cerebral vascular accident) ([place])     2008 - felt due to oscillating calcium on aortic valve  . Carcinoma in situ of vulva   . Fibroid   . Ovarian cyst, left 2008-2009  . Osteoporosis   . Diabetes mellitus   . Hypertension   . GERD (gastroesophageal reflux disease)   . Chronic systolic CHF (congestive heart failure) (HCC)     EF 35%  . Ischemic cardiomyopathy     EF 35%  . Coronary artery disease     Occluded LM 2/2 previous aortic root surgery; s/p 2v CABG 2010 LIMA to LAD, SVG to OM; Cath 03/11/12  patent SVG to OM, atretic LIMA to LAD, patent RCA, occluded native LM, EF 35%   . Arthritis     OSTEO  . Fracture of lumbar spine ([place])   . Ventricular fibrillation ([place])     VF arrest/VT/torsades 06/2012 with prolonged hosp with VDRF, cardiogen shock, asp PNA, encephalopathy, anemia, shock liver, hypernatremia - s/p ICD implantation 06/08/12.  . Superficial venous thrombosis of upper extremity     06/2012 - LUE  . Severe aortic stenosis     s/p homograft aortic root replacement 2008  . Mitral regurgitation     s/p mitral valve repair 2008  . Thoracic aortic aneurysm [facility])     s/p resection and grafting 2008  . Stroke ([place])   . Kidney stones   . Dysrhythmia   . AICD (automatic cardioverter/defibrillator) present     2014    Past Surgical History  Procedure Laterality Date  . Foot surgery Left     removed neuroma  . Aortic root replacement  2008    homograft  . Mitral valve replacement  2008    due to severe MR  . Resection and grafting of ascending thoracic aortic    . Aneurysm and left sided maze  2008  . Coronary artery bypass graft  4/10    LIMA to LAD, SVG to OM  . Wide excision of vulva      CA INSITU  .  Cardiac defibrillator placement  06/08/2012    [facility] [place] DR  implanted by Dr Rayann Heman following a cardiac arrest  . Right heart catheterization N/A 04/03/2012    Procedure: RIGHT HEART CATH;  Surgeon: [name], MD;  Location: [facility] CATH LAB;  Service: Cardiovascular;  Laterality: N/A;  . Implantable cardioverter defibrillator implant N/A 06/08/2012    Procedure: IMPLANTABLE CARDIOVERTER DEFIBRILLATOR IMPLANT;  Surgeon: Thompson Grayer, MD;  Location: [facility] CATH LAB;  Service: Cardiovascular;  Laterality: N/A;  . Cataract extraction    . Eye surgery      Family History  Problem Relation Age of Onset  . Hypertension Mother   . Breast cancer Maternal Aunt     age 48's  . Colon cancer Paternal Grandmother   . Diabetes Maternal Uncle   . Diabetes Paternal  Uncle   . Emphysema Father    Social History:  reports that she quit smoking about 31 years ago. Her smoking use included Cigarettes. She has never used smokeless tobacco. She reports that she does not drink alcohol or use illicit drugs.  Allergies:  Allergies  Allergen Reactions  . Metformin And Related     Renal issues  . Tikosyn [Dofetilide]     Passed out  . Caffeine Other (See Comments)    Affects heart    Medications Prior to Admission  Medication Sig Dispense Refill  . amiodarone (PACERONE) 200 MG tablet Take 1 tablet (200 mg total) by mouth daily. 90 tablet 3  . calcitonin, salmon, (MIACALCIN/FORTICAL) 200 UNIT/ACT nasal spray Place 1 spray into alternate nostrils daily.  1  . carvedilol (COREG) 12.5 MG tablet Take 1 tablet (12.5 mg total) by mouth 2 (two) times daily with a meal. 180 tablet 3  . Cyanocobalamin (VITAMIN B-12) 2500 MCG SUBL Place 1 tablet under the tongue every Monday, Wednesday, and Friday.     . docusate sodium (COLACE) 100 MG capsule Take 100 mg by mouth daily as needed for mild constipation.    . enoxaparin (LOVENOX) 80 MG/0.8ML injection Inject 0.8 mLs (80 mg total) into the skin every 12 (twelve) hours. 20 Syringe 0  . ergocalciferol (VITAMIN D2) 50000 UNITS capsule Take 50,000 Units by mouth once a week. Monday    . furosemide (LASIX) 20 MG tablet TAKE 1 TABLET BY MOUTH EVERY DAY 90 tablet 3  . HYDROcodone-acetaminophen (NORCO/VICODIN) 5-325 MG per tablet Take 1-2 tablets by mouth every 4 (four) hours as needed for severe pain. 18 tablet 0  . ILEVRO 0.3 % ophthalmic suspension Place 1 drop into the right eye at bedtime.  1  . linagliptin (TRADJENTA) 5 MG TABS tablet Take 1 tablet (5 mg total) by mouth daily. 30 tablet   . losartan (COZAAR) 50 MG tablet TAKE 1 TABLET (50 MG TOTAL) BY MOUTH DAILY. 90 tablet 2  . NON FORMULARY ([employer]) Take 500 mg of elemental calcium by mouth at bedtime    . omeprazole (PRILOSEC OTC) 20 MG tablet Take 20 mg by mouth  daily before breakfast.     . Psyllium (METAMUCIL PO) Take 1 teaspoon by mouth daily with water    . warfarin (COUMADIN) 3 MG tablet Take 1.5-3 mg by mouth daily. 3mg  Sundays and Thursdays. 1.5mg  all other days.    Marland Kitchen acetaminophen (TYLENOL) 500 MG tablet Take 1,000 mg by mouth every 6 (six) hours as needed for mild pain.    Marland Kitchen denosumab (PROLIA) 60 MG/ML SOLN injection Inject 60 mg into the skin every 6 (six)  months. Administer in upper arm, thigh, or abdomen    . diazepam (VALIUM) 5 MG tablet Take 5 mg by mouth every 8 (eight) hours as needed for anxiety.      Results for orders placed or performed during the hospital encounter of 12/11/14 (from the past 48 hour(s))  Glucose, capillary     Status: Abnormal   Collection Time: 12/11/14  7:13 AM  Result Value Ref Range   Glucose-Capillary 145 (H) 65 - 99 mg/dL  I-STAT, chem 8     Status: Abnormal   Collection Time: 12/11/14  7:22 AM  Result Value Ref Range   Sodium 139 135 - 145 mmol/L   Potassium 3.8 3.5 - 5.1 mmol/L   Chloride 99 (L) 101 - 111 mmol/L   BUN 34 (H) 6 - 20 mg/dL   Creatinine, Ser 1.60 (H) 0.44 - 1.00 mg/dL   Glucose, Bld 164 (H) 65 - 99 mg/dL   Calcium, Ion 1.15 1.13 - 1.30 mmol/L   TCO2 28 0 - 100 mmol/L   Hemoglobin 13.9 12.0 - 15.0 g/dL   HCT 41.0 36.0 - 46.0 %  APTT     Status: None   Collection Time: 12/11/14  7:41 AM  Result Value Ref Range   aPTT 29 24 - 37 seconds  Protime-INR     Status: Abnormal   Collection Time: 12/11/14  7:41 AM  Result Value Ref Range   Prothrombin Time 17.0 (H) 11.6 - 15.2 seconds   INR 1.38 0.00 - 1.49   No results found.  Positive for history of kidney stones  Blood pressure 135/57, pulse 60, temperature 98.3 F (36.8 C), temperature source Oral, resp. rate 18, height 5\' 3"  (1.6 m), weight 76.885 kg (169 lb 8 oz), SpO2 98 %.  She is awake alert and oriented. She has no facial asymmetry. His hard to assess her strength due to the severe pain but does appear to be intact with  decreased reflexes Assessment/Plan Impression is that of a acute compression fracture T12. The plan is for a T12 vertebroplasty.  Faythe Ghee, MD 12/11/2014, 8:34 AM

## 2014-12-11 NOTE — Op Note (Signed)
Preop diagnosis: T12 osteoporotic compression fracture Postop diagnosis: Same Procedure: T12 kyphoplasty with define system Surgeon: Phallon Haydu  After being placed in the prone position and lined with AP and lateral fluoroscopy the patient's back was prepped and draped in the usual sterile fashion. Small stab wound was made in line with the lateral edge of the left T12 pedicle and the Jamshidi needle was passed without difficulty from lateral to medial direction directly in line with the middle of the vertebral body. Was in good position remove the inner cannula and then passed the cavity maker made a cavity in all directions particular cross the midline. We then delivered the cement in standard fashion delivering approximately 4.7 mL of cement. This gave excellent fill of the vertebral body. We slightly withdrew the cannula delivered a bit more cement and felt that we had adequate fill. We tamped down the cement and slowly withdrew the working cannula. Final fluoroscopy looked excellent. We then put 3 staples in the skin and applied a Band-Aid. The patient was then extubated and taken to recovery room in stable condition.

## 2014-12-12 ENCOUNTER — Encounter (HOSPITAL_COMMUNITY): Payer: Self-pay | Admitting: Neurosurgery

## 2014-12-16 ENCOUNTER — Ambulatory Visit (INDEPENDENT_AMBULATORY_CARE_PROVIDER_SITE_OTHER): Payer: Medicare Other

## 2014-12-16 DIAGNOSIS — I059 Rheumatic mitral valve disease, unspecified: Secondary | ICD-10-CM | POA: Diagnosis not present

## 2014-12-16 DIAGNOSIS — Z7901 Long term (current) use of anticoagulants: Secondary | ICD-10-CM

## 2014-12-16 DIAGNOSIS — I359 Nonrheumatic aortic valve disorder, unspecified: Secondary | ICD-10-CM

## 2014-12-16 DIAGNOSIS — I4891 Unspecified atrial fibrillation: Secondary | ICD-10-CM | POA: Diagnosis not present

## 2014-12-16 LAB — POCT INR: INR: 1.9

## 2014-12-24 ENCOUNTER — Other Ambulatory Visit: Payer: Self-pay | Admitting: Neurosurgery

## 2014-12-24 DIAGNOSIS — S22080A Wedge compression fracture of T11-T12 vertebra, initial encounter for closed fracture: Secondary | ICD-10-CM

## 2014-12-25 ENCOUNTER — Ambulatory Visit
Admission: RE | Admit: 2014-12-25 | Discharge: 2014-12-25 | Disposition: A | Discharge status: Home / Self Care | Payer: Medicare Other | Source: Ambulatory Visit | Attending: Neurosurgery | Admitting: Neurosurgery

## 2014-12-25 DIAGNOSIS — S22080A Wedge compression fracture of T11-T12 vertebra, initial encounter for closed fracture: Secondary | ICD-10-CM

## 2014-12-25 DIAGNOSIS — M8008XA Age-related osteoporosis with current pathological fracture, vertebra(e), initial encounter for fracture: Secondary | ICD-10-CM | POA: Diagnosis not present

## 2014-12-27 DIAGNOSIS — H2512 Age-related nuclear cataract, left eye: Secondary | ICD-10-CM | POA: Diagnosis not present

## 2014-12-27 DIAGNOSIS — H52202 Unspecified astigmatism, left eye: Secondary | ICD-10-CM | POA: Diagnosis not present

## 2014-12-27 DIAGNOSIS — H269 Unspecified cataract: Secondary | ICD-10-CM | POA: Diagnosis not present

## 2014-12-30 ENCOUNTER — Ambulatory Visit (INDEPENDENT_AMBULATORY_CARE_PROVIDER_SITE_OTHER): Payer: Medicare Other | Admitting: *Deleted

## 2014-12-30 DIAGNOSIS — I059 Rheumatic mitral valve disease, unspecified: Secondary | ICD-10-CM | POA: Diagnosis not present

## 2014-12-30 DIAGNOSIS — I4891 Unspecified atrial fibrillation: Secondary | ICD-10-CM | POA: Diagnosis not present

## 2014-12-30 DIAGNOSIS — I359 Nonrheumatic aortic valve disorder, unspecified: Secondary | ICD-10-CM | POA: Diagnosis not present

## 2014-12-30 DIAGNOSIS — Z7901 Long term (current) use of anticoagulants: Secondary | ICD-10-CM | POA: Diagnosis not present

## 2014-12-30 LAB — POCT INR: INR: 4

## 2014-12-31 DIAGNOSIS — Z6829 Body mass index (BMI) 29.0-29.9, adult: Secondary | ICD-10-CM | POA: Diagnosis not present

## 2014-12-31 DIAGNOSIS — S22080A Wedge compression fracture of T11-T12 vertebra, initial encounter for closed fracture: Secondary | ICD-10-CM | POA: Diagnosis not present

## 2015-01-01 ENCOUNTER — Ambulatory Visit (INDEPENDENT_AMBULATORY_CARE_PROVIDER_SITE_OTHER): Payer: Medicare Other

## 2015-01-01 DIAGNOSIS — Z95810 Presence of automatic (implantable) cardiac defibrillator: Secondary | ICD-10-CM

## 2015-01-01 DIAGNOSIS — I255 Ischemic cardiomyopathy: Secondary | ICD-10-CM

## 2015-01-02 ENCOUNTER — Telehealth: Payer: Self-pay | Admitting: Internal Medicine

## 2015-01-02 NOTE — Progress Notes (Addendum)
EPIC Encounter for ICM Monitoring  Patient Name: [name] is a 74 y.o. female Date: 01/02/2015 Primary Care Physican: Velna Hatchet, MD Primary Cardiologist: Stanford Breed Electrophysiologist: Allred Dry Weight: 167 lb       In the past month, have you:  1. Gained more than 2 pounds in a day or more than 5 pounds in a week? no  2. Had changes in your medications (with verification of current medications)? Yes, taking eye drops for post cataract procedure.   3. Had more shortness of breath than is usual for you? no  4. Limited your activity because of shortness of breath? no  5. Not been able to sleep because of shortness of breath? no  6. Had increased swelling in your feet or ankles? Yes, occasionally.  Advised to elevate feet when sitting but she is unable to do that due to it causes more back pain.    7. Had symptoms of dehydration (dizziness, dry mouth, increased thirst, decreased urine output) no  8. Had changes in sodium restriction? no  9. Been compliant with medication? Yes   ICM trend: 01/01/2015   Follow-up plan: ICM clinic phone appointment on 02/04/2015.  Corvue impedance reference line dropping as well as the daily impedance from ~11/30/2014 to 12/22/2014 and she stated it is related to the T-12 Vertebroplasty she had in September which was not effective for resolving the pain.  She stated she is very immobile and the next treatment will be to try cortisone injections.  She would like to know if Dr Rayann Heman thinks there is any contraindication to have back injections done. Advised the pain clinic will send a request for clearance if the the injections needs to be done and since she is followed by the Coumadin clinic in this office, the pharmacist will provide instructions regarding the Coumadin before a procedure. Advised I will send Dr Rayann Heman a message regarding the procedure and if any recommendations, will call her back.           Copy of note sent to patient's primary  care physician, primary cardiologist, and device following physician.  Rosalene Billings, RN, CCM 01/02/2015 11:13 AM   Agree with anticoagulation clinic managing bridging                 ----- Message -----     From: Rosalene Billings, RN     Sent: 01/02/2015 11:26 AM      To: Thompson Grayer, MD

## 2015-01-02 NOTE — Telephone Encounter (Addendum)
error 

## 2015-01-03 DIAGNOSIS — H2512 Age-related nuclear cataract, left eye: Secondary | ICD-10-CM | POA: Diagnosis not present

## 2015-01-08 ENCOUNTER — Other Ambulatory Visit: Payer: Self-pay | Admitting: Internal Medicine

## 2015-01-09 DIAGNOSIS — Z23 Encounter for immunization: Secondary | ICD-10-CM | POA: Diagnosis not present

## 2015-01-13 ENCOUNTER — Ambulatory Visit (INDEPENDENT_AMBULATORY_CARE_PROVIDER_SITE_OTHER): Payer: Medicare Other | Admitting: *Deleted

## 2015-01-13 DIAGNOSIS — I359 Nonrheumatic aortic valve disorder, unspecified: Secondary | ICD-10-CM | POA: Diagnosis not present

## 2015-01-13 DIAGNOSIS — Z7901 Long term (current) use of anticoagulants: Secondary | ICD-10-CM

## 2015-01-13 DIAGNOSIS — M810 Age-related osteoporosis without current pathological fracture: Secondary | ICD-10-CM | POA: Diagnosis not present

## 2015-01-13 DIAGNOSIS — E559 Vitamin D deficiency, unspecified: Secondary | ICD-10-CM | POA: Diagnosis not present

## 2015-01-13 DIAGNOSIS — I4891 Unspecified atrial fibrillation: Secondary | ICD-10-CM

## 2015-01-13 DIAGNOSIS — I059 Rheumatic mitral valve disease, unspecified: Secondary | ICD-10-CM | POA: Diagnosis not present

## 2015-01-13 DIAGNOSIS — R5383 Other fatigue: Secondary | ICD-10-CM | POA: Diagnosis not present

## 2015-01-13 LAB — POCT INR: INR: 2.5

## 2015-01-23 DIAGNOSIS — M546 Pain in thoracic spine: Secondary | ICD-10-CM | POA: Diagnosis not present

## 2015-01-23 DIAGNOSIS — M810 Age-related osteoporosis without current pathological fracture: Secondary | ICD-10-CM | POA: Diagnosis not present

## 2015-01-23 DIAGNOSIS — S22080D Wedge compression fracture of T11-T12 vertebra, subsequent encounter for fracture with routine healing: Secondary | ICD-10-CM | POA: Diagnosis not present

## 2015-01-23 DIAGNOSIS — R5383 Other fatigue: Secondary | ICD-10-CM | POA: Diagnosis not present

## 2015-01-23 DIAGNOSIS — D72829 Elevated white blood cell count, unspecified: Secondary | ICD-10-CM | POA: Diagnosis not present

## 2015-01-23 DIAGNOSIS — S22080A Wedge compression fracture of T11-T12 vertebra, initial encounter for closed fracture: Secondary | ICD-10-CM | POA: Diagnosis not present

## 2015-01-27 ENCOUNTER — Ambulatory Visit (INDEPENDENT_AMBULATORY_CARE_PROVIDER_SITE_OTHER): Payer: Medicare Other | Admitting: *Deleted

## 2015-01-27 DIAGNOSIS — I059 Rheumatic mitral valve disease, unspecified: Secondary | ICD-10-CM

## 2015-01-27 DIAGNOSIS — Z7901 Long term (current) use of anticoagulants: Secondary | ICD-10-CM | POA: Diagnosis not present

## 2015-01-27 DIAGNOSIS — I4891 Unspecified atrial fibrillation: Secondary | ICD-10-CM | POA: Diagnosis not present

## 2015-01-27 DIAGNOSIS — I359 Nonrheumatic aortic valve disorder, unspecified: Secondary | ICD-10-CM | POA: Diagnosis not present

## 2015-01-27 LAB — POCT INR: INR: 3

## 2015-01-28 DIAGNOSIS — R310 Gross hematuria: Secondary | ICD-10-CM | POA: Diagnosis not present

## 2015-01-28 DIAGNOSIS — M7989 Other specified soft tissue disorders: Secondary | ICD-10-CM | POA: Diagnosis not present

## 2015-01-28 DIAGNOSIS — D485 Neoplasm of uncertain behavior of skin: Secondary | ICD-10-CM | POA: Diagnosis not present

## 2015-01-28 DIAGNOSIS — N200 Calculus of kidney: Secondary | ICD-10-CM | POA: Diagnosis not present

## 2015-01-28 DIAGNOSIS — Z85828 Personal history of other malignant neoplasm of skin: Secondary | ICD-10-CM | POA: Diagnosis not present

## 2015-01-28 DIAGNOSIS — N3001 Acute cystitis with hematuria: Secondary | ICD-10-CM | POA: Diagnosis not present

## 2015-01-29 DIAGNOSIS — I10 Essential (primary) hypertension: Secondary | ICD-10-CM | POA: Diagnosis not present

## 2015-01-29 DIAGNOSIS — N183 Chronic kidney disease, stage 3 (moderate): Secondary | ICD-10-CM | POA: Diagnosis not present

## 2015-01-29 DIAGNOSIS — I129 Hypertensive chronic kidney disease with stage 1 through stage 4 chronic kidney disease, or unspecified chronic kidney disease: Secondary | ICD-10-CM | POA: Diagnosis not present

## 2015-01-29 DIAGNOSIS — S22020S Wedge compression fracture of second thoracic vertebra, sequela: Secondary | ICD-10-CM | POA: Diagnosis not present

## 2015-01-29 DIAGNOSIS — Z6827 Body mass index (BMI) 27.0-27.9, adult: Secondary | ICD-10-CM | POA: Diagnosis not present

## 2015-01-29 DIAGNOSIS — E119 Type 2 diabetes mellitus without complications: Secondary | ICD-10-CM | POA: Diagnosis not present

## 2015-02-04 ENCOUNTER — Ambulatory Visit (INDEPENDENT_AMBULATORY_CARE_PROVIDER_SITE_OTHER): Payer: Medicare Other | Admitting: *Deleted

## 2015-02-04 DIAGNOSIS — Z95810 Presence of automatic (implantable) cardiac defibrillator: Secondary | ICD-10-CM

## 2015-02-04 DIAGNOSIS — I255 Ischemic cardiomyopathy: Secondary | ICD-10-CM

## 2015-02-05 NOTE — Progress Notes (Signed)
Remote ICD transmission.   

## 2015-02-06 NOTE — Progress Notes (Signed)
EPIC Encounter for ICM Monitoring  Patient Name: [name] is a 74 y.o. female Date: 02/06/2015 Primary Care Physican: Velna Hatchet, MD Primary Cardiologist: Stanford Breed Electrophysiologist: Allred Dry Weight: 159 lb       In the past month, have you:  1. Gained more than 2 pounds in a day or more than 5 pounds in a week? no  2. Had changes in your medications (with verification of current medications)? She will be starting Forteo in the next 2 weeks.   3. Had more shortness of breath than is usual for you? no  4. Limited your activity because of shortness of breath? no  5. Not been able to sleep because of shortness of breath? no  6. Had increased swelling in your feet or ankles? no  7. Had symptoms of dehydration (dizziness, dry mouth, increased thirst, decreased urine output) no  8. Had changes in sodium restriction? no  9. Been compliant with medication? Yes   ICM trend:  1 year view   ICM Trend: 3 month view   Follow-up plan: ICM clinic phone appointment in 02/06/2015+31  Corvue daily impedance and reference line dropping suggesting the reference line may be changing due to inactivity since September.   Patient has become inactive due to thoracic vertebral fractures and failed Vertebroplasty.   She reported slight swelling in her feet and PCP checked for edema in the past couple of weeks.  She is unable to elevate legs due to her back.  She has had many other health issues.  Denied any shortness of breath and has lost weight due to decreased appetite. No changes today.    Copy of note sent to patient's primary care physician, primary cardiologist, and device following physician.  Rosalene Billings, RN, CCM 02/06/2015 2:21 PM

## 2015-02-10 ENCOUNTER — Telehealth: Payer: Self-pay | Admitting: *Deleted

## 2015-02-10 NOTE — Telephone Encounter (Signed)
Pt calls to states she is cancelling her Lumbar injection on 12/125/16. Thus her lovenox bridging appt today was cancelled and rescheduled.

## 2015-02-12 DIAGNOSIS — M810 Age-related osteoporosis without current pathological fracture: Secondary | ICD-10-CM | POA: Diagnosis not present

## 2015-02-13 LAB — CUP PACEART REMOTE DEVICE CHECK
Battery Remaining Longevity: 72 mo
Battery Remaining Percentage: 73 %
Battery Voltage: 3.01 V
Brady Statistic AP VS Percent: 42 %
Brady Statistic RA Percent Paced: 41 %
Date Time Interrogation Session: 20161129070017
HIGH POWER IMPEDANCE MEASURED VALUE: 60 Ohm
HIGH POWER IMPEDANCE MEASURED VALUE: 60 Ohm
Implantable Lead Implant Date: 20140403
Implantable Lead Implant Date: 20140403
Implantable Lead Location: 753860
Lead Channel Pacing Threshold Pulse Width: 0.5 ms
Lead Channel Sensing Intrinsic Amplitude: 12 mV
Lead Channel Setting Pacing Amplitude: 2 V
Lead Channel Setting Pacing Pulse Width: 0.5 ms
Lead Channel Setting Sensing Sensitivity: 0.5 mV
MDC IDC LEAD LOCATION: 753859
MDC IDC MSMT LEADCHNL RA IMPEDANCE VALUE: 460 Ohm
MDC IDC MSMT LEADCHNL RA PACING THRESHOLD AMPLITUDE: 0.75 V
MDC IDC MSMT LEADCHNL RA SENSING INTR AMPL: 4.7 mV
MDC IDC MSMT LEADCHNL RV IMPEDANCE VALUE: 610 Ohm
MDC IDC MSMT LEADCHNL RV PACING THRESHOLD AMPLITUDE: 0.75 V
MDC IDC MSMT LEADCHNL RV PACING THRESHOLD PULSEWIDTH: 0.5 ms
MDC IDC SET LEADCHNL RV PACING AMPLITUDE: 2.5 V
MDC IDC STAT BRADY AP VP PERCENT: 1 %
MDC IDC STAT BRADY AS VP PERCENT: 1 %
MDC IDC STAT BRADY AS VS PERCENT: 57 %
MDC IDC STAT BRADY RV PERCENT PACED: 1 %
Pulse Gen Serial Number: 7080516

## 2015-02-14 ENCOUNTER — Encounter: Payer: Self-pay | Admitting: Cardiology

## 2015-02-14 DIAGNOSIS — N3001 Acute cystitis with hematuria: Secondary | ICD-10-CM | POA: Diagnosis not present

## 2015-02-14 DIAGNOSIS — N390 Urinary tract infection, site not specified: Secondary | ICD-10-CM | POA: Diagnosis not present

## 2015-02-17 ENCOUNTER — Ambulatory Visit (INDEPENDENT_AMBULATORY_CARE_PROVIDER_SITE_OTHER): Payer: Medicare Other

## 2015-02-17 DIAGNOSIS — Z7901 Long term (current) use of anticoagulants: Secondary | ICD-10-CM | POA: Diagnosis not present

## 2015-02-17 DIAGNOSIS — I359 Nonrheumatic aortic valve disorder, unspecified: Secondary | ICD-10-CM

## 2015-02-17 DIAGNOSIS — I059 Rheumatic mitral valve disease, unspecified: Secondary | ICD-10-CM

## 2015-02-17 DIAGNOSIS — I4891 Unspecified atrial fibrillation: Secondary | ICD-10-CM

## 2015-02-17 LAB — POCT INR: INR: 3

## 2015-02-25 DIAGNOSIS — L89311 Pressure ulcer of right buttock, stage 1: Secondary | ICD-10-CM | POA: Diagnosis not present

## 2015-02-25 DIAGNOSIS — K5909 Other constipation: Secondary | ICD-10-CM | POA: Diagnosis not present

## 2015-02-28 ENCOUNTER — Encounter: Payer: Self-pay | Admitting: Cardiology

## 2015-03-11 ENCOUNTER — Telehealth: Payer: Self-pay | Admitting: Internal Medicine

## 2015-03-11 ENCOUNTER — Other Ambulatory Visit: Payer: Self-pay | Admitting: Internal Medicine

## 2015-03-11 NOTE — Telephone Encounter (Signed)
Returned call to patient and she states that [place] is not on her list of covered medications for this year.  She had previously tried Amiodarone and had nausea with this and has been taking Pacerone since.  She spoke with insurance today and they will be faxing a form for Dr Rayann Heman to complete and sign

## 2015-03-11 NOTE — Telephone Encounter (Signed)
New message    Patient calling [employer] will be faxing over form regarding medication . Patient is asking a call back will explain.

## 2015-03-12 DIAGNOSIS — S22080D Wedge compression fracture of T11-T12 vertebra, subsequent encounter for fracture with routine healing: Secondary | ICD-10-CM | POA: Diagnosis not present

## 2015-03-12 DIAGNOSIS — M810 Age-related osteoporosis without current pathological fracture: Secondary | ICD-10-CM | POA: Diagnosis not present

## 2015-03-12 DIAGNOSIS — D72829 Elevated white blood cell count, unspecified: Secondary | ICD-10-CM | POA: Diagnosis not present

## 2015-03-12 DIAGNOSIS — R5383 Other fatigue: Secondary | ICD-10-CM | POA: Diagnosis not present

## 2015-03-13 ENCOUNTER — Ambulatory Visit (INDEPENDENT_AMBULATORY_CARE_PROVIDER_SITE_OTHER): Payer: Medicare Other

## 2015-03-13 DIAGNOSIS — Z95810 Presence of automatic (implantable) cardiac defibrillator: Secondary | ICD-10-CM | POA: Diagnosis not present

## 2015-03-13 DIAGNOSIS — I255 Ischemic cardiomyopathy: Secondary | ICD-10-CM | POA: Diagnosis not present

## 2015-03-14 NOTE — Progress Notes (Signed)
EPIC Encounter for ICM Monitoring  Patient Name: [name] is a 75 y.o. female Date: 03/14/2015 Primary Care Physican: Velna Hatchet, MD Primary Cardiologist: Stanford Breed Electrophysiologist: Allred Dry Weight: 158 lbs 2 weeks ago       In the past month, have you:  1. Gained more than 2 pounds in a day or more than 5 pounds in a week? no  2. Had changes in your medications (with verification of current medications)? no  3. Had more shortness of breath than is usual for you? Yes, she has shortness of breath when getting dressed which did not start until after she had back fracture in September 2016  4. Limited your activity because of shortness of breath? no  5. Not been able to sleep because of shortness of breath? no  6. Had increased swelling in your feet or ankles? Yes, in her feet  7. Had symptoms of dehydration (dizziness, dry mouth, increased thirst, decreased urine output) no  8. Had changes in sodium restriction? no  9. Been compliant with medication? Yes   ICM trend: 1 year view 03/13/2015   ICM Trend:  3 month view 03/14/2015  Follow-up plan: ICM clinic phone appointment 05/12/2015.  Corvue impedance below baseline and baseline has dropped as well since her back fracture in September.  She gets SOB when dressing and if walking quickly which did not occur after her back surgery.  She has swelling in feet.  She does not routinely weigh at home. She stated she does not think she is having fluid symptoms.  Explained the transmission continues to progressively trend below the baseline as well as the baseline is dropping which may suggest fluid retention even though she is less active than normally since vertebroplasty in September.        Creatinine on 12/11/2014 was 1.87        Reviewed transmission with Karen Marshall, NP in office and recommended she be seen in the office next week.   Called patient back and explained Amber suggested she have an office visit next week.  She  agreed to appointment on 03/20/2015 with Cecilie Kicks, NP to evaluate if she is having HF symptoms.    Copy of note sent to patient's primary care physician, primary cardiologist, and device following physician.  Rosalene Billings, RN, CCM 03/14/2015 12:11 PM

## 2015-03-18 ENCOUNTER — Other Ambulatory Visit: Payer: Self-pay | Admitting: Cardiology

## 2015-03-18 MED ORDER — WARFARIN SODIUM 3 MG PO TABS: ORAL_TABLET | ORAL | Status: DC

## 2015-03-18 NOTE — Telephone Encounter (Signed)
°*  STAT* If patient is at the pharmacy, call can be transferred to refill team.   1. Which medications need to be refilled? (please list name of each medication and dose if known) [place] prescription and new pharmacy  2. Which pharmacy/location (including street and city if local pharmacy) is medication to be sent to?[facility]-[phone] or Fax [phone]  3. Do they need a 30 day or 90 day supply? 30 and refills

## 2015-03-20 ENCOUNTER — Encounter: Payer: Self-pay | Admitting: Cardiology

## 2015-03-20 ENCOUNTER — Ambulatory Visit (INDEPENDENT_AMBULATORY_CARE_PROVIDER_SITE_OTHER): Payer: Medicare Other | Admitting: *Deleted

## 2015-03-20 ENCOUNTER — Ambulatory Visit (INDEPENDENT_AMBULATORY_CARE_PROVIDER_SITE_OTHER): Payer: Medicare Other | Admitting: Cardiology

## 2015-03-20 VITALS — BP 124/74 | HR 64 | Ht 63.0 in | Wt 160.2 lb

## 2015-03-20 DIAGNOSIS — I059 Rheumatic mitral valve disease, unspecified: Secondary | ICD-10-CM

## 2015-03-20 DIAGNOSIS — I255 Ischemic cardiomyopathy: Secondary | ICD-10-CM

## 2015-03-20 DIAGNOSIS — I4891 Unspecified atrial fibrillation: Secondary | ICD-10-CM

## 2015-03-20 DIAGNOSIS — Z7901 Long term (current) use of anticoagulants: Secondary | ICD-10-CM

## 2015-03-20 DIAGNOSIS — I359 Nonrheumatic aortic valve disorder, unspecified: Secondary | ICD-10-CM | POA: Diagnosis not present

## 2015-03-20 DIAGNOSIS — R609 Edema, unspecified: Secondary | ICD-10-CM

## 2015-03-20 DIAGNOSIS — R0602 Shortness of breath: Secondary | ICD-10-CM | POA: Diagnosis not present

## 2015-03-20 DIAGNOSIS — Z79899 Other long term (current) drug therapy: Secondary | ICD-10-CM | POA: Diagnosis not present

## 2015-03-20 DIAGNOSIS — E8770 Fluid overload, unspecified: Secondary | ICD-10-CM | POA: Diagnosis not present

## 2015-03-20 DIAGNOSIS — Z95810 Presence of automatic (implantable) cardiac defibrillator: Secondary | ICD-10-CM

## 2015-03-20 LAB — CBC WITH DIFFERENTIAL/PLATELET
BASOS PCT: 0 % (ref 0–1)
Basophils Absolute: 0 10*3/uL (ref 0.0–0.1)
Eosinophils Absolute: 0.2 10*3/uL (ref 0.0–0.7)
Eosinophils Relative: 3 % (ref 0–5)
HCT: 36.8 % (ref 36.0–46.0)
HEMOGLOBIN: 12 g/dL (ref 12.0–15.0)
Lymphocytes Relative: 9 % — ABNORMAL LOW (ref 12–46)
Lymphs Abs: 0.7 10*3/uL (ref 0.7–4.0)
MCH: 31.5 pg (ref 26.0–34.0)
MCHC: 32.6 g/dL (ref 30.0–36.0)
MCV: 96.6 fL (ref 78.0–100.0)
MPV: 10.1 fL (ref 8.6–12.4)
Monocytes Absolute: 0.6 10*3/uL (ref 0.1–1.0)
Monocytes Relative: 7 % (ref 3–12)
NEUTROS ABS: 6.6 10*3/uL (ref 1.7–7.7)
NEUTROS PCT: 81 % — AB (ref 43–77)
Platelets: 322 10*3/uL (ref 150–400)
RBC: 3.81 MIL/uL — AB (ref 3.87–5.11)
RDW: 13.4 % (ref 11.5–15.5)
WBC: 8.2 10*3/uL (ref 4.0–10.5)

## 2015-03-20 LAB — COMPREHENSIVE METABOLIC PANEL
ALK PHOS: 91 U/L (ref 33–130)
ALT: 12 U/L (ref 6–29)
AST: 20 U/L (ref 10–35)
Albumin: 3.7 g/dL (ref 3.6–5.1)
BUN: 21 mg/dL (ref 7–25)
CHLORIDE: 100 mmol/L (ref 98–110)
CO2: 25 mmol/L (ref 20–31)
CREATININE: 1.53 mg/dL — AB (ref 0.60–0.93)
Calcium: 9.2 mg/dL (ref 8.6–10.4)
Glucose, Bld: 98 mg/dL (ref 65–99)
Potassium: 4.4 mmol/L (ref 3.5–5.3)
SODIUM: 139 mmol/L (ref 135–146)
TOTAL PROTEIN: 6.5 g/dL (ref 6.1–8.1)
Total Bilirubin: 0.6 mg/dL (ref 0.2–1.2)

## 2015-03-20 LAB — POCT INR: INR: 2.4

## 2015-03-20 NOTE — Progress Notes (Signed)
Cardiology Office Note   Date:  03/20/2015   ID:  [name], DOB 07/23/1940, MRN [id number]  PCP:  Velna Hatchet, MD  Cardiologist:  Dr. Stanford Breed EP:  Dr. Rayann Heman    Urologist-Dr. Hartley Barefoot.  Chief Complaint  Patient presents with  . Shortness of Breath    pt states this is due to her back pain       History of Present Illness: [name] is a 75 y.o. female who presents for swelling of lower extremities. And changes in optivol concerning for HF.    History of severe AS, mitral regurgitation, aneurysm of the ascending thoracic aorta (status post aortic valve replacement, mitral valve repair, resection and grafting of the ascending thoracic aortic aneurysm on Jul 07, 2006), history of paroxysmal atrial fibrillation (status post maze procedure on Jul 07, 2006). Followup catheterization performed secondary to elevated troponin and recurrent atrial fibrillation revealed an ejection fraction of 25-30% and occlusion of the reimplanted left main. The patient subsequently underwent redo coronary artery bypass and graft with a LIMA to the LAD and a saphenous vein graft to the OM. She also had an epicardial lead placed if she required biventricular pacing in the future. Cardiac catheterization repeated in January of 2014 because of reduced LV function. The left main was occluded. The LAD, circumflex and right coronary artery were patent. The LIMA to the LAD was patent but atretic. Saphenous vein graft to the obtuse marginal was patent. This graft filled the obtuse marginal and ramus intermedius and the entire LAD filled retrograde from this graft. Ejection fraction was 35%. Patient referred to Dr. Rayann Heman for consideration of ICD; also placed on tikosyn for atrial fibrillation. Patient then admitted in March of 2014 following a ventricular fibrillation arrest. She required a prolonged resuscitation. Her QT was prolonged and her tikosyn DCed. Her hospital course was complicated by a DVT in her left  arm. She ultimately had a [employer] ICD implanted. Amiodarone initiated. Echocardiogram repeated February 2015 and showed an ejection fraction of [id number], grade 2 diastolic dysfunction, bioprosthetic aortic valve with mild aortic insufficiency, mild left atrial enlargement, moderate right ventricular enlargement with reduced function.   Pt has compression fractures of her spine T 12 and T9, she had Kyphoplasty in Oct without much improvement.  She has been in bed or chair most of time since then and has decreased activity level due to pain.  When her pain increases she does have SOB with the pain.  She has had some swelling in her lower extremities but she is sitting most of day.  She is able to sleep without SOB.  She does sleep on a wedge due to GERD but no trouble with SOB.   With her pain med use her appetite has been decreased so her weight is down but she has been eating chips- we discussed salt intake.  No chest pain.          Past Medical History  Diagnosis Date  . Anemia   . Atrial fibrillation [facility])     A.fib/flutter: s/p MAZE 2008, DCCV 2011, on coumadin; previously on flecainide, but dc'd due to worsening EF. Tikosyn discontinued 06/2012 after cardiac arrest.  . Diverticulitis   . CVA (cerebral vascular accident) ([place])     2008 - felt due to oscillating calcium on aortic valve  . Carcinoma in situ of vulva   . Fibroid   . Ovarian cyst, left 2008-2009  . Osteoporosis   . Diabetes mellitus   .  Hypertension   . GERD (gastroesophageal reflux disease)   . Chronic systolic CHF (congestive heart failure) (HCC)     EF 35%  . Ischemic cardiomyopathy     EF 35%  . Coronary artery disease     Occluded LM 2/2 previous aortic root surgery; s/p 2v CABG 2010 LIMA to LAD, SVG to OM; Cath 03/11/12 patent SVG to OM, atretic LIMA to LAD, patent RCA, occluded native LM, EF 35%   . Arthritis     OSTEO  . Fracture of lumbar spine ([place])   . Ventricular fibrillation ([place])     VF arrest/VT/torsades  06/2012 with prolonged hosp with VDRF, cardiogen shock, asp PNA, encephalopathy, anemia, shock liver, hypernatremia - s/p ICD implantation 06/08/12.  . Superficial venous thrombosis of upper extremity     06/2012 - LUE  . Severe aortic stenosis     s/p homograft aortic root replacement 2008  . Mitral regurgitation     s/p mitral valve repair 2008  . Thoracic aortic aneurysm [facility])     s/p resection and grafting 2008  . Stroke ([place])   . Kidney stones   . Dysrhythmia   . AICD (automatic cardioverter/defibrillator) present     2014    Past Surgical History  Procedure Laterality Date  . Foot surgery Left     removed neuroma  . Aortic root replacement  2008    homograft  . Mitral valve replacement  2008    due to severe MR  . Resection and grafting of ascending thoracic aortic    . Aneurysm and left sided maze  2008  . Coronary artery bypass graft  4/10    LIMA to LAD, SVG to OM  . Wide excision of vulva      CA INSITU  . Cardiac defibrillator placement  06/08/2012    [facility] [place] [address]  implanted by Dr Rayann Heman following a cardiac arrest  . Right heart catheterization N/A 04/03/2012    Procedure: RIGHT HEART CATH;  Surgeon: [name], MD;  Location: [facility] CATH LAB;  Service: Cardiovascular;  Laterality: N/A;  . Implantable cardioverter defibrillator implant N/A 06/08/2012    Procedure: IMPLANTABLE CARDIOVERTER DEFIBRILLATOR IMPLANT;  Surgeon: Thompson Grayer, MD;  Location: [facility] CATH LAB;  Service: Cardiovascular;  Laterality: N/A;  . Cataract extraction    . Eye surgery    . Kyphoplasty N/A 12/11/2014    Procedure: KYPHOPLASTY T-12;  Surgeon: Karie Chimera, MD;  Location: [facility] NEURO ORS;  Service: Neurosurgery;  Laterality: N/A;  KYPHOPLASTY T-12     Current Outpatient Prescriptions  Medication Sig Dispense Refill  . carvedilol (COREG) 12.5 MG tablet TAKE 1 TABLET (12.5 MG TOTAL) BY MOUTH 2 (TWO) TIMES DAILY WITH A MEAL. 180 tablet 3  . Cyanocobalamin (VITAMIN B-12) 2500 MCG  SUBL Place 1 tablet under the tongue every Monday, Wednesday, and Friday.     . ergocalciferol (VITAMIN D2) 50000 UNITS capsule Take 50,000 Units by mouth once a week. Monday    . furosemide (LASIX) 20 MG tablet TAKE 1 TABLET BY MOUTH EVERY DAY 90 tablet 3  . HYDROcodone-acetaminophen (NORCO/VICODIN) 5-325 MG per tablet Take 1-2 tablets by mouth every 4 (four) hours as needed for severe pain. 18 tablet 0  . linagliptin (TRADJENTA) 5 MG TABS tablet Take 1 tablet (5 mg total) by mouth daily. 30 tablet   . losartan (COZAAR) 50 MG tablet TAKE 1 TABLET (50 MG TOTAL) BY MOUTH DAILY. 90 tablet 2  . NON FORMULARY ([employer])  Take 500 mg of elemental calcium by mouth at bedtime    . omeprazole (PRILOSEC OTC) 20 MG tablet Take 20 mg by mouth daily before breakfast.     . PACERONE 200 MG tablet TAKE 1 TABLET (200 MG TOTAL) BY MOUTH DAILY. 90 tablet 2  . polyethylene glycol powder (MIRALAX) powder Take 1 Container by mouth as needed.    . Psyllium (METAMUCIL PO) Take 1 teaspoon by mouth daily with water    . Teriparatide, Recombinant, (FORTEO) 600 MCG/2.4ML SOLN Inject 20 mcg into the skin daily.    Marland Kitchen warfarin (COUMADIN) 3 MG tablet Take as directed by Coumadin Clinic 30 tablet 3   No current facility-administered medications for this visit.    Allergies:   Metformin and related; Tikosyn; and Caffeine    Social History:  The patient  reports that she quit smoking about 32 years ago. Her smoking use included Cigarettes. She has never used smokeless tobacco. She reports that she does not drink alcohol or use illicit drugs.   Family History:  The patient's family history includes Breast cancer in her maternal aunt; Colon cancer in her paternal grandmother; Diabetes in her maternal uncle and paternal uncle; Emphysema in her father; Hypertension in her mother.    ROS:  General:no colds or fevers, + weight loss due to decreased appetite  Skin:no rashes or ulcers HEENT:no blurred vision, no  congestion CV:see HPI PUL:see HPI GI:no diarrhea constipation or melena, no indigestion GU:no hematuria, no dysuria MS:no joint pain, no claudication, ++back pain Neuro:no syncope, no lightheadedness Endo:no diabetes, no thyroid disease  Wt Readings from Last 3 Encounters:  03/20/15 160 lb 3.2 oz (72.666 kg)  12/11/14 169 lb 8 oz (76.885 kg)  12/09/14 169 lb 8 oz (76.885 kg)     PHYSICAL EXAM: VS:  BP 124/74 mmHg  Pulse 64  Ht 5\' 3"  (1.6 m)  Wt 160 lb 3.2 oz (72.666 kg)  BMI 28.39 kg/m2 , BMI Body mass index is 28.39 kg/(m^2). General:Pleasant affect, NAD Skin:Warm and dry, brisk capillary refill HEENT:normocephalic, sclera clear, mucus membranes moist Neck:supple, no JVD, no bruits  Heart:S1S2 RRR without murmur, gallup, rub or click Lungs:clear without rales, rhonchi, or wheezes [id number], non tender, + BS, do not palpate liver spleen or masses Ext:no lower ext edema, 2+ pedal pulses, 2+ radial pulses Neuro:alert and oriented, MAE, follows commands, + facial symmetry    EKG:  EKG is Not ordered today.    Recent Labs: 04/04/2014: ALT 11; TSH 4.46 12/09/2014: Platelets 346 12/11/2014: BUN 34*; Creatinine, Ser 1.60*; Hemoglobin 13.9; Potassium 3.8; Sodium 139    Lipid Panel    Component Value Date/Time   CHOL 169 04/04/2014 1019   TRIG 141.0 04/04/2014 1019   HDL 59.50 04/04/2014 1019   CHOLHDL 3 04/04/2014 1019   VLDL 28.2 04/04/2014 1019   LDLCALC 81 04/04/2014 1019   LDLDIRECT 132.3 10/17/2008 0827       Other studies Reviewed: Additional studies/ records that were reviewed today include: previous office notes  ECHO 2015 . Study Conclusions  - Left ventricle: The cavity size was normal. There was mild concentric hypertrophy. Systolic function was mildly to moderately reduced. The estimated ejection fraction was in the range of 40% to 45%. Features are consistent with a pseudonormal left ventricular filling pattern, with concomitant abnormal  relaxation and increased filling pressure (grade 2 diastolic dysfunction). - Aortic valve: A bioprosthesis was present. Mild regurgitation. It was not possible to determine whether the regurgitation was valvular or perivalvular. -  Mitral valve: An annular ring prosthesis was present. - Left atrium: The atrium was mildly dilated. - Right ventricle: The cavity size was moderately dilated. Systolic function was moderately to severely reduced. - Atrial septum: No defect or patent foramen ovale was identified. - Pulmonary arteries: PA peak pressure: 70mm Hg (S). Impressions:  - The right ventricular systolic pressure was increased consistent with mild pulmonary hypertension.  CARDIAC CATH 2014 Left mainstem: Total occlusion at the ostium  Left anterior descending (LAD): Patent to the left ventricular apex. Fills entirely from retrograde flow supplied by the saphenous vein graft obtuse marginal  Left circumflex (LCx): The left circumflex and its branch vessels are patent. They fill entirely from the saphenous vein graft obtuse marginal  Right coronary artery (RCA): This is a very large, dominant vessel. The vessel is smooth throughout its course without any significant obstructive disease. There is a large PDA branch and a large posterolateral branch, both of which are patent.  LIMA to LAD: Patent but atretic  Saphenous vein graft to obtuse marginal: Widely patent throughout. The coronary anastomotic site is widely patent. This fills the obtuse marginal/ramus intermedius and antegrade fashion. The entire LAD fills retrograde from this graft. The AV groove circumflex also fills retrograde and supplies 2 more distal obtuse marginal branches.  Left ventriculography: The mid anterior wall is severely hypokinetic. The apex is severely hypokinetic. The inferior wall is mildly hypokinetic. Left ventricular ejection fraction is estimated at 35%.  Final Conclusions:  1. 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 Wide patency of the right coronary artery 3. Atretic LIMA to LAD 4. Widely patent saphenous vein graft to obtuse marginal supplying all branches of the left coronary artery 5. Severe segmental left ventricular systolic dysfunction  [name] 03/21/2012, 11:28 AM  ASSESSMENT AND PLAN:  1.  DOE more due to back pain, pt seems euvolemic today, she will decrease her salt intake and I will check BNP for baseline.   2.  PAF on amiodarone, will check CMP, CBC, TSH for amio follow up- to see Dr. Rayann Heman in Feb 2017  3. Anticoagulation on coumadin, to be checked today.  Denies any bleeding.  4. Aortic valve disorder with AVR it has been 2 years since echo will rechieck and she will follow up with Dr. Stanford Breed in 4 months.  5. MV repair, check Echo  6. CAD hx CABG no chest pain.  7. ICM with ICD currently euvolemic continue ARB and BB and Lasix 20 mg.   8. Continue SBE prophy.       Current medicines are reviewed with the patient today.  The patient Has no concerns regarding medicines.  The following changes have been made:  See above Labs/ tests ordered today include:see above  Disposition:   FU:  see above  Lennie Muckle, NP  03/20/2015 12:31 PM    [facility] [place], [place], [address] [place], [place] Phone: [phone]; Fax: [phone]

## 2015-03-20 NOTE — Patient Instructions (Addendum)
Medication Instructions:  Your physician recommends that you continue on your current medications as directed. Please refer to the Current Medication list given to you today.   Labwork: TODAY:  BNP                CMP                TSH                CBC W/DIFF  Testing/Procedures: Your physician has requested that you have an echocardiogram. Echocardiography is a painless test that uses sound waves to create images of your heart. It provides your doctor with information about the size and shape of your heart and how well your heart's chambers and valves are working. This procedure takes approximately one hour. There are no restrictions for this procedure.    Follow-Up: Your physician recommends that you schedule a follow-up appointment in:  [place] DR. CRENSHAW   Any Other Special Instructions Will Be Listed Below (If Applicable). Echocardiogram An echocardiogram, or echocardiography, uses sound waves (ultrasound) to produce an image of your heart. The echocardiogram is simple, painless, obtained within a short period of time, and offers valuable information to your health care provider. The images from an echocardiogram can provide information such as:  Evidence of coronary artery disease (CAD).  Heart size.  Heart muscle function.  Heart valve function.  Aneurysm detection.  Evidence of a past heart attack.  Fluid buildup around the heart.  Heart muscle thickening.  Assess heart valve function. LET [facility] CARE PROVIDER KNOW ABOUT:  Any allergies you have.  All medicines you are taking, including vitamins, herbs, eye drops, creams, and over-the-counter medicines.  Previous problems you or members of your family have had with the use of anesthetics.  Any blood disorders you have.  Previous surgeries you have had.  Medical conditions you have.  Possibility of pregnancy, if this applies. BEFORE THE PROCEDURE  No special preparation is needed. Eat and  drink normally.  PROCEDURE   In order to produce an image of your heart, gel will be applied to your chest and a wand-like tool (transducer) will be moved over your chest. The gel will help transmit the sound waves from the transducer. The sound waves will harmlessly bounce off your heart to allow the heart images to be captured in real-time motion. These images will then be recorded.  You may need an IV to receive a medicine that improves the quality of the pictures. AFTER THE PROCEDURE You may return to your normal schedule including diet, activities, and medicines, unless your health care provider tells you otherwise.   This information is not intended to replace advice given to you by your health care provider. Make sure you discuss any questions you have with your health care provider.   Document Released: 02/20/2000 Document Revised: 03/15/2014 Document Reviewed: 10/30/2012 Elsevier Interactive Patient Education [employer].     If you need a refill on your cardiac medications before your next appointment, please call your pharmacy.

## 2015-03-21 ENCOUNTER — Telehealth: Payer: Self-pay | Admitting: *Deleted

## 2015-03-21 LAB — TSH: TSH: 4.993 u[IU]/mL — AB (ref 0.350–4.500)

## 2015-03-21 LAB — BRAIN NATRIURETIC PEPTIDE: BRAIN NATRIURETIC PEPTIDE: 64.3 pg/mL (ref 0.0–100.0)

## 2015-03-21 NOTE — Telephone Encounter (Signed)
Per Cecilie Kicks, NP, called pt to inform her that her labs were normal except her TSH and it was a little high.  She has been advised that we didn't think it was causing any problems, but we have sent a copy to her PCP and if they want to monitor it or do anything, they should call her.  She informed that she has an appt to see PCP in February.

## 2015-03-21 NOTE — Telephone Encounter (Signed)
-----   Message from Isaiah Serge, NP sent at 03/21/2015 12:21 PM EST ----- Labs stable though TSH slightly abnormal, sent a copy to PCP  But doubt it is causing any problems.

## 2015-03-25 ENCOUNTER — Telehealth: Payer: Self-pay

## 2015-03-25 NOTE — Telephone Encounter (Signed)
Prior auth for [employer] 200mg  sent to [facility].

## 2015-03-26 ENCOUNTER — Telehealth: Payer: Self-pay | Admitting: Internal Medicine

## 2015-03-26 DIAGNOSIS — R310 Gross hematuria: Secondary | ICD-10-CM | POA: Diagnosis not present

## 2015-03-26 DIAGNOSIS — N200 Calculus of kidney: Secondary | ICD-10-CM | POA: Diagnosis not present

## 2015-03-26 DIAGNOSIS — Z0000 Encounter for general adult medical examination without abnormal findings: Secondary | ICD-10-CM | POA: Diagnosis not present

## 2015-03-26 DIAGNOSIS — N390 Urinary tract infection, site not specified: Secondary | ICD-10-CM | POA: Diagnosis not present

## 2015-03-26 NOTE — Telephone Encounter (Signed)
[employer] calling to let us know that patients Pacerone has been approved for 1 year-fax will be coming as well

## 2015-03-31 ENCOUNTER — Ambulatory Visit (HOSPITAL_COMMUNITY): Payer: Medicare Other | Attending: Internal Medicine

## 2015-03-31 ENCOUNTER — Other Ambulatory Visit: Payer: Self-pay

## 2015-03-31 DIAGNOSIS — Z87891 Personal history of nicotine dependence: Secondary | ICD-10-CM | POA: Insufficient documentation

## 2015-03-31 DIAGNOSIS — I517 Cardiomegaly: Secondary | ICD-10-CM | POA: Insufficient documentation

## 2015-03-31 DIAGNOSIS — E8770 Fluid overload, unspecified: Secondary | ICD-10-CM | POA: Insufficient documentation

## 2015-03-31 DIAGNOSIS — I10 Essential (primary) hypertension: Secondary | ICD-10-CM | POA: Insufficient documentation

## 2015-03-31 DIAGNOSIS — Z95810 Presence of automatic (implantable) cardiac defibrillator: Secondary | ICD-10-CM | POA: Insufficient documentation

## 2015-03-31 DIAGNOSIS — R609 Edema, unspecified: Secondary | ICD-10-CM

## 2015-03-31 DIAGNOSIS — I351 Nonrheumatic aortic (valve) insufficiency: Secondary | ICD-10-CM | POA: Diagnosis not present

## 2015-03-31 DIAGNOSIS — E119 Type 2 diabetes mellitus without complications: Secondary | ICD-10-CM | POA: Diagnosis not present

## 2015-03-31 DIAGNOSIS — I052 Rheumatic mitral stenosis with insufficiency: Secondary | ICD-10-CM | POA: Insufficient documentation

## 2015-03-31 DIAGNOSIS — R0602 Shortness of breath: Secondary | ICD-10-CM | POA: Diagnosis not present

## 2015-03-31 DIAGNOSIS — I071 Rheumatic tricuspid insufficiency: Secondary | ICD-10-CM | POA: Insufficient documentation

## 2015-04-01 ENCOUNTER — Ambulatory Visit: Payer: Medicare Other | Attending: Sports Medicine

## 2015-04-01 DIAGNOSIS — R293 Abnormal posture: Secondary | ICD-10-CM | POA: Diagnosis not present

## 2015-04-01 DIAGNOSIS — M545 Low back pain, unspecified: Secondary | ICD-10-CM

## 2015-04-01 DIAGNOSIS — R531 Weakness: Secondary | ICD-10-CM | POA: Diagnosis not present

## 2015-04-01 DIAGNOSIS — M810 Age-related osteoporosis without current pathological fracture: Secondary | ICD-10-CM

## 2015-04-01 DIAGNOSIS — M4850XA Collapsed vertebra, not elsewhere classified, site unspecified, initial encounter for fracture: Secondary | ICD-10-CM

## 2015-04-01 NOTE — Therapy (Signed)
[facility] [address] [place], [place], [address] Phone: [phone]   Fax:  [phone]  Physical Therapy Evaluation  Patient Details  Name: [name] MRN: [id number] Date of Birth: 02-Apr-1940 Referring Provider: [name], MD  Encounter Date: 04/01/2015      PT End of Session - 04/01/15 1052    Visit Number 1   Number of Visits 10   Date for PT Re-Evaluation 05/27/15   PT Start Time 1003   PT Stop Time 1041   PT Time Calculation (min) 38 min   Activity Tolerance Patient tolerated treatment well   Behavior During Therapy [name] for tasks assessed/performed      Past Medical History  Diagnosis Date  . Anemia   . Atrial fibrillation [facility])     A.fib/flutter: s/p MAZE 2008, DCCV 2011, on coumadin; previously on flecainide, but dc'd due to worsening EF. Tikosyn discontinued 06/2012 after cardiac arrest.  . Diverticulitis   . CVA (cerebral vascular accident) ([name])     2008 - felt due to oscillating calcium on aortic valve  . Carcinoma in situ of vulva   . Fibroid   . Ovarian cyst, left 2008-2009  . Osteoporosis   . Diabetes mellitus   . Hypertension   . GERD (gastroesophageal reflux disease)   . Chronic systolic CHF (congestive heart failure) (HCC)     EF 35%  . Ischemic cardiomyopathy     EF 35%  . Coronary artery disease     Occluded LM 2/2 previous aortic root surgery; s/p 2v CABG 2010 LIMA to LAD, SVG to OM; Cath 03/11/12 patent SVG to OM, atretic LIMA to LAD, patent RCA, occluded native LM, EF 35%   . Arthritis     OSTEO  . Fracture of lumbar spine ([place])   . Ventricular fibrillation ([name])     VF arrest/VT/torsades 06/2012 with prolonged hosp with VDRF, cardiogen shock, asp PNA, encephalopathy, anemia, shock liver, hypernatremia - s/p ICD implantation 06/08/12.  . Superficial venous thrombosis of upper extremity     06/2012 - LUE  . Severe aortic stenosis     s/p homograft aortic root replacement  2008  . Mitral regurgitation     s/p mitral valve repair 2008  . Thoracic aortic aneurysm [facility])     s/p resection and grafting 2008  . Stroke ([name])   . Kidney stones   . Dysrhythmia   . AICD (automatic cardioverter/defibrillator) present     2014    Past Surgical History  Procedure Laterality Date  . Foot surgery Left     removed neuroma  . Aortic root replacement  2008    homograft  . Mitral valve replacement  2008    due to severe MR  . Resection and grafting of ascending thoracic aortic    . Aneurysm and left sided maze  2008  . Coronary artery bypass graft  4/10    LIMA to LAD, SVG to OM  . Wide excision of vulva      CA INSITU  . Cardiac defibrillator placement  06/08/2012    [place] DR  implanted by Dr Rayann Heman following a cardiac arrest  . Right heart catheterization N/A 04/03/2012    Procedure: RIGHT HEART CATH;  Surgeon: [name], MD;  Location: [facility] CATH LAB;  Service: Cardiovascular;  Laterality: N/A;  . Implantable cardioverter defibrillator implant N/A 06/08/2012    Procedure: IMPLANTABLE CARDIOVERTER DEFIBRILLATOR IMPLANT;  Surgeon: Thompson Grayer, MD;  Location: [facility] CATH LAB;  Service: Cardiovascular;  Laterality: N/A;  . Cataract extraction    . Eye surgery    . Kyphoplasty N/A 12/11/2014    Procedure: KYPHOPLASTY T-12;  Surgeon: Karie Chimera, MD;  Location: [name];  Service: Neurosurgery;  Laterality: N/A;  KYPHOPLASTY T-12    There were no vitals filed for this visit.  Visit Diagnosis:  Osteoporosis - Plan: PT plan of care cert/re-cert  Compression fracture of spine, non-traumatic, initial encounter ([name]) - Plan: PT plan of care cert/re-cert  Posture abnormality - Plan: PT plan of care cert/re-cert  Bilateral low back pain without sciatica - Plan: PT plan of care cert/re-cert      Subjective Assessment - 04/01/15 1011    Subjective Pt is a 75 y.o. female who presents to PT wit h T11, T12 and L1 compression fractures  sustained 11/08/15.  No incident or injury.  Pt began experiecing pain.  Once diagnosed, pt had kyphoplasty and reports no change in pain with this procedure.  Pt has been mostly in a wheelchair since September 2016.   Pertinent History Osteoporosis, Pacemaker/defibrillator, Kyphoplasty (12/2014)   Limitations Standing;Walking   How long can you stand comfortably? 10 minutes max   How long can you walk comfortably? only around the house up to 5 minutes   Diagnostic tests x-ray, CT scan   Patient Stated Goals reduce pain, stand and walk more, wean from wheelchair use   Currently in Pain? Yes   Pain Score 3    Pain Location Back   Pain Orientation Right;Left;Lower   Pain Descriptors / Indicators Aching;Throbbing   Pain Type Chronic pain   Pain Onset More than a month ago   Pain Frequency Constant   Aggravating Factors  standing and walking, sitting too long   Pain Relieving Factors heat, sitting,    Effect of Pain on Daily Activities not able to stand and walk in the community            [facility] PT Assessment - 04/01/15 0001    Assessment   Medical Diagnosis T12 compression fracture, subaccute T11 & L1 compression fracture, osteoporosis   Referring Provider [name], MD   Onset Date/Surgical Date 11/08/14   Next MD Visit 04/2015   Precautions   Precautions Fall;Other (comment);ICD/Pacemaker  osteoporosis   Restrictions   Weight Bearing Restrictions No   Balance Screen   Has the patient fallen in the past 6 months No   Has the patient had a decrease in activity level because of a fear of falling?  No   Is the patient reluctant to leave their home because of a fear of falling?  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  Fixed thoracic kyphosis so limited AROM in the thoracic and lumbar spine.  Hamstring flexiblity limited by >25% in sitting.   Strength   Overall Strength Deficits   Overall Strength Comments 4/5 UE strength, 4 to 4+/5 LE strength   Palpation   Palpation comment palpable tenderness/tension in bilateral thoracic and lumbar paraspinals   Transfers   Transfers Sit to Stand;Stand to Sit   Sit to Stand 6: Modified independent (Device/Increase time)   Stand to Sit 6: Modified independent (Device/Increase time);With armrests;With upper extremity assist   Ambulation/Gait   Ambulation/Gait Yes   Ambulation/Gait Assistance 6: Modified independent (Device/Increase time)   Ambulation Distance (Feet) 10 Feet   Gait Pattern Step-through pattern   Ambulation Surface Level                           PT Education - 04/01/15 1038    Education provided Yes   Education Details seated LE strength, scap squeezes, dos and donts of osteo   Person(s) Educated Patient   Methods Explanation;Demonstration   Comprehension Verbalized understanding;Returned demonstration          PT Short Term Goals - 04/01/15 1056    PT SHORT TERM GOAL #1   Title be independent in initial HEP   Time 4   Period Weeks   Status New   PT SHORT TERM GOAL #2   Title stand for 10  minutes to prepare an easy meal without need to sit   Time 4   Period Weeks   Status New   PT SHORT TERM GOAL #3   Title ambulate into and out of the clinic with or without device at least 50% of the time   Time 4   Period Weeks   Status New   PT SHORT TERM GOAL #4   Title report a 25% reduction in LBP with standing for ADLs and self-care   Time 4   Period Weeks   Status New           PT Long Term Goals - 04/01/15 1000    PT LONG TERM GOAL #1   Title be independent in advanced HEP   Time 8   Period Weeks   Status New   PT LONG TERM GOAL #2   Title reduce FOTO to < or = to 51% limitation   Time 8   Period Weeks   Status New   PT LONG TERM GOAL #3   Title stand at home for cooking and ADLs for 15 minutes without need to rest   Time 8   Period Weeks   Status New   PT LONG TERM GOAL #4   Title increase LE strength to ambulate in the community for > or = to 5-10 minutes without significant fatigue   Time 8   Period Weeks   Status New   PT LONG TERM GOAL #5   Title report a 50% reduction in LBP with standing for ADLs and self-care   Time 8   Period Weeks   Status New               Plan - 04/01/15 1052    Clinical Impression Statement Pt presents to PT s/p thoracic/lumbar spinal fractures that occurred 11/2014 without incident.  Pt has been in a wheelchair mostly since this time.  Pt presents with  significant thoracic kyphosis and rounded shoulder posture.  Pt with weakness in UE/LE and limited hamstring length.  Pt is limited to standing and walking < 10 minutes at home due to pain and fatigue.  Pt will beneift from skilled PT for postural education, osteoporosis information and UE/LE strength and endurance training to allow for reutrn to standing and walking without significant limitation.   Pt will benefit from skilled therapeutic intervention in order to improve on the following deficits Pain;Postural dysfunction;Decreased strength;Decreased mobility;Impaired  flexibility;Improper body mechanics;Decreased activity tolerance;Decreased endurance;Difficulty walking   Rehab Potential Good   PT Frequency 2x / week   PT Duration 8 weeks   PT Treatment/Interventions ADLs/Self Care Home Management;Cryotherapy;Moist Heat;Ultrasound;Gait training;Stair training;Functional mobility training;Therapeutic activities;Therapeutic exercise;Manual techniques;Patient/family education;Neuromuscular re-education;Passive range of motion   PT Next Visit Plan UE/LE strength and endurance, standing tolerance, postural strength, pain management as needed.   Consulted and Agree with Plan of Care Patient          G-Codes - April 28, 2015 1002    Functional Assessment Tool Used FOTO: 67% limitation   Functional Limitation Other PT primary   Other PT Primary Current Status [id number]) At least 60 percent but less than 80 percent impaired, limited or restricted   Other PT Primary Goal Status [id number]) At least 40 percent but less than 60 percent impaired, limited or restricted       Problem List Patient Active Problem List   Diagnosis Date Noted  . ICD (implantable cardioverter-defibrillator) in [place] 11/28/2012  . Kidney disease, chronic, stage III (GFR 30-59 ml/min) 10/19/2012  . 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 Back pain, thoracic 02/21/2012  . Ischemic cardiomyopathy 01/26/2012  . Carcinoma in situ of vulva   . Fibroid   . Ovarian cyst, left   . Osteoporosis, idiopathic   . 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 GERD (gastroesophageal reflux disease)   . Encounter for long-term (current) use of anticoagulants 06/08/2010  . Mitral valve disease 04/16/2010  . VITAMIN D DEFICIENCY 11/05/2008  . VITAMIN B12 DEFICIENCY 10/22/2008  . Essential hypertension, benign 07/30/2008  . Celiac disease 08/09/2007  . HYPERLIPIDEMIA 07/21/2007  . Atrial fibrillation ([place])  07/21/2007  . CHOLELITHIASIS 07/21/2007  . UNSPECIFIED IRON DEFICIENCY ANEMIA 06/06/2007    TAKACS,KELLY , PT  04/28/15, 11:01 AM  [facility] [address], [place], [place], [address] Phone: [phone]   Fax:  [phone]  Name: MIKAH BAUM MRN: [id number] Date of Birth: 06-07-1940

## 2015-04-01 NOTE — Patient Instructions (Signed)
KNEE: Extension, Long Arc Quad (Weight)  Place weight around leg. Raise leg until knee is straight. Hold _5__ seconds. Use ___ lb weight. _10__ reps per set (each leg), 4-5__ sets per day, __7_ days per week  Copyright  VHI. All rights reserved.     Knee Raise   Lift knee and then lower it. Repeat with other knee. Repeat _10__ times each leg. Do _4-5___ sessions per day.  http://gt2.exer.us/445   Copyright  VHI. All rights reserved.  Toe Up   Gently rise up on toes and back on heels. Repeat _20___ times. Do 4-5____ sessions per day.  Scapular Retraction (Standing)   With arms at sides, pinch shoulder blades together. Repeat ____ times per set. Do ____ sets per session. Do ____ sessions per day.                                             DO's and DON'T's   Avoid and/or Minimize positions of forward bending ( flexion)  Side bending and rotation of the trunk  Especially when movements occur together   When your back aches:   Don't sit down   Lie down on your back with a small pillow under your head and one under your knees or as outlined by our therapist. Or, lie in the 90/90 position ( on the floor with your feet and legs on the sofa with knees and hips bent to 90 degrees)  Tying or putting on your shoes:   Don't bend over to tie your shoes or put on socks.  Instead, bring one foot up, cross it over the opposite knee and bend forward (hinge) at the hips to so the task.  Keep your back straight.  If you cannot do this safely, then you need to use long handled assistive devices such as a shoehorn and sock puller.  Exercising:  Don't engage in ballistic types of exercise routines such as high-impact aerobics or jumping rope  Don't do exercises in the gym that bring you forward (abdominal crunches, sit-ups, touching your  toes, knee-to-chest, straight leg raising.)  Follow a regular exercise program that includes a variety of different weight-bearing activities,  such as low-impact aerobics, T' ai chi or walking as your physical therapist advises  Do exercises that emphasize return to normal body alignment and strengthening of the muscles that keep your back straight, as outlined in this program or by your therapist  Household tasks:  Don't reach unnecessarily or twist your trunk when mopping, sweeping, vacuuming, raking, making beds, weeding gardens, getting objects ou of cupboards, etc.  Keep your broom, mop, vacuum, or rake close to you and mover your whole body as you move them. Walk over to the area on which you are working. Arrange kitchen, bathroom, and bedroom shelves so that frequently used items may be reached without excessive bending, twisting, and reaching.  Use a sturdy stool if necessary.  Don't bend from the waist to pick up something up  Off the floor, out of the trunk of your car, or to brush your teeth, wash your face, etc.   Bend at the knees, keeping back straight as possible. Use a reacher if necessary.   Prevention of fracture is the so-called "BOTTOm -Line" in the management of OSTEOPOROSIS. Do not take unnecessary chances in movement. Once a compression fracture occurs, the process is very difficult to  control; one fracture is frequently followed by many more.      [place] [address], [place], [place] [address] Phone # [phone] Fax [phone]

## 2015-04-03 ENCOUNTER — Telehealth: Payer: Self-pay

## 2015-04-03 ENCOUNTER — Ambulatory Visit: Payer: Medicare Other

## 2015-04-03 DIAGNOSIS — M545 Low back pain, unspecified: Secondary | ICD-10-CM

## 2015-04-03 DIAGNOSIS — M810 Age-related osteoporosis without current pathological fracture: Secondary | ICD-10-CM | POA: Diagnosis not present

## 2015-04-03 DIAGNOSIS — R531 Weakness: Secondary | ICD-10-CM

## 2015-04-03 DIAGNOSIS — M4850XA Collapsed vertebra, not elsewhere classified, site unspecified, initial encounter for fracture: Secondary | ICD-10-CM | POA: Diagnosis not present

## 2015-04-03 DIAGNOSIS — R293 Abnormal posture: Secondary | ICD-10-CM | POA: Diagnosis not present

## 2015-04-03 NOTE — Telephone Encounter (Signed)
Tier exception for Pacerone reduced to a Tier 1.

## 2015-04-03 NOTE — Therapy (Signed)
[facility] [address], [place], [place], [address] Phone: [phone]   Fax:  [phone]  Physical Therapy Treatment  Patient Details  Name: [name] MRN: [id number] Date of Birth: 04-09-1940 Referring Provider: [name], MD  Encounter Date: 04/03/2015      PT End of Session - 04/03/15 1138    Visit Number 2   Number of Visits 10   Date for PT Re-Evaluation 05/27/15   PT Start Time 1100   PT Stop Time 1143   PT Time Calculation (min) 43 min   Activity Tolerance Patient tolerated treatment well   Behavior During Therapy [facility] for tasks assessed/performed      Past Medical History  Diagnosis Date  . Anemia   . Atrial fibrillation [facility])     A.fib/flutter: s/p MAZE 2008, DCCV 2011, on coumadin; previously on flecainide, but dc'd due to worsening EF. Tikosyn discontinued 06/2012 after cardiac arrest.  . Diverticulitis   . CVA (cerebral vascular accident) ([place])     2008 - felt due to oscillating calcium on aortic valve  . Carcinoma in situ of vulva   . Fibroid   . Ovarian cyst, left 2008-2009  . Osteoporosis   . Diabetes mellitus   . Hypertension   . GERD (gastroesophageal reflux disease)   . Chronic systolic CHF (congestive heart failure) (HCC)     EF 35%  . Ischemic cardiomyopathy     EF 35%  . Coronary artery disease     Occluded LM 2/2 previous aortic root surgery; s/p 2v CABG 2010 LIMA to LAD, SVG to OM; Cath 03/11/12 patent SVG to OM, atretic LIMA to LAD, patent RCA, occluded native LM, EF 35%   . Arthritis     OSTEO  . Fracture of lumbar spine ([place])   . Ventricular fibrillation ([place])     VF arrest/VT/torsades 06/2012 with prolonged hosp with VDRF, cardiogen shock, asp PNA, encephalopathy, anemia, shock liver, hypernatremia - s/p ICD implantation 06/08/12.  . Superficial venous thrombosis of upper extremity     06/2012 - LUE  . Severe aortic stenosis     s/p homograft aortic root replacement  2008  . Mitral regurgitation     s/p mitral valve repair 2008  . Thoracic aortic aneurysm [facility])     s/p resection and grafting 2008  . Stroke ([place])   . Kidney stones   . Dysrhythmia   . AICD (automatic cardioverter/defibrillator) present     2014    Past Surgical History  Procedure Laterality Date  . Foot surgery Left     removed neuroma  . Aortic root replacement  2008    homograft  . Mitral valve replacement  2008    due to severe MR  . Resection and grafting of ascending thoracic aortic    . Aneurysm and left sided maze  2008  . Coronary artery bypass graft  4/10    LIMA to LAD, SVG to OM  . Wide excision of vulva      CA INSITU  . Cardiac defibrillator placement  06/08/2012    [facility] [place] DR  implanted by Dr Rayann Heman following a cardiac arrest  . Right heart catheterization N/A 04/03/2012    Procedure: RIGHT HEART CATH;  Surgeon: [name], MD;  Location: [facility] C[facility] LAB;  Service: Cardiovascular;  Laterality: N/A;  . Implantable cardioverter defibrillator implant N/A 06/08/2012    Procedure: IMPLANTABLE CARDIOVERTER DEFIBRILLATOR IMPLANT;  Surgeon: Thompson Grayer, MD;  Location: [place] CATH LAB;  Service: Cardiovascular;  Laterality: N/A;  . Cataract extraction    . Eye surgery    . Kyphoplasty N/A 12/11/2014    Procedure: KYPHOPLASTY T-12;  Surgeon: Karie Chimera, MD;  Location: [place] NEURO ORS;  Service: Neurosurgery;  Laterality: N/A;  KYPHOPLASTY T-12    There were no vitals filed for this visit.  Visit Diagnosis:  Osteoporosis  Compression fracture of spine, non-traumatic, initial encounter ([place])  Bilateral low back pain without sciatica  Posture abnormality  Weakness      Subjective Assessment - 04/03/15 1102    Subjective Pt is doing OK with new exercises.     Pertinent History Osteoporosis, Pacemaker/defibrillator, Kyphoplasty (12/2014)   Currently in Pain? Yes   Pain Score 1    Pain Location Back   Pain Orientation Right;Left;Lower   Pain  Type Chronic pain   Pain Onset More than a month ago   Pain Frequency Constant                         [facility] Adult PT Treatment/Exercise - 04/03/15 0001    Ambulation/Gait   Ambulation/Gait Yes   Ambulation/Gait Assistance 6: Modified independent (Device/Increase time);5: Supervision   Ambulation Distance (Feet) 150 Feet   Assistive device Rolling walker   Gait Pattern Step-through pattern   Ambulation Surface Level   Exercises   Exercises Knee/Hip;Lumbar;Shoulder   Knee/Hip Exercises: Aerobic   Nustep Level 1 x 8   seat 8, arms 9   Knee/Hip Exercises: Standing   Heel Raises Both;20 reps   Hip Abduction Stengthening;2 sets;10 reps   Hip Extension Stengthening;Both;2 sets;10 reps   Knee/Hip Exercises: Seated   Long Arc Quad Both;2 sets;10 reps;Strengthening   Other Seated Knee/Hip Exercises heel/toe raises 2x10   Marching Strengthening;Both;2 sets;10 reps   Shoulder Exercises: Seated   Row 20 reps;Theraband   Theraband Level (Shoulder Row) Level 1 (Yellow)   Flexion Strengthening;Both;20 reps   Abduction Strengthening;Both;20 reps   ABduction Limitations also scaption 2x10                  PT Short Term Goals - 04/01/15 1056    PT SHORT TERM GOAL #1   Title be independent in initial HEP   Time 4   Period Weeks   Status New   PT SHORT TERM GOAL #2   Title stand for 10 minutes to prepare an easy meal without need to sit   Time 4   Period Weeks   Status New   PT SHORT TERM GOAL #3   Title ambulate into and out of the clinic with or without device at least 50% of the time   Time 4   Period Weeks   Status New   PT SHORT TERM GOAL #4   Title report a 25% reduction in LBP with standing for ADLs and self-care   Time 4   Period Weeks   Status New           PT Long Term Goals - 04/01/15 1000    PT LONG TERM GOAL #1   Title be independent in advanced HEP   Time 8   Period Weeks   Status New   PT LONG TERM GOAL #2   Title reduce FOTO  to < or = to 51% limitation   Time 8   Period Weeks   Status New   PT LONG TERM GOAL #3   Title stand at home  for cooking and ADLs for 15 minutes without need to rest   Time 8   Period Weeks   Status New   PT LONG TERM GOAL #4   Title increase LE strength to ambulate in the community for > or = to 5-10 minutes without significant fatigue   Time 8   Period Weeks   Status New   PT LONG TERM GOAL #5   Title report a 50% reduction in LBP with standing for ADLs and self-care   Time 8   Period Weeks   Status New               Plan - 04/03/15 1104    Clinical Impression Statement Pt with only 1 session after evauation.  Pt has been performing heel/toe raises at home but not other leg exercises.  Pt ambulated in the clinic with walker today.  Pt with significant thoracic kyphosis and rounded shoulder posture with weak abdominals.  Pt with weakness in UE/LEs and is limited in standing long periods.  Pt will benefit from skilled PT for osteoporosis education, strength and endurance training and core strength.     Pt will benefit from skilled therapeutic intervention in order to improve on the following deficits Pain;Postural dysfunction;Decreased strength;Decreased mobility;Impaired flexibility;Improper body mechanics;Decreased activity tolerance;Decreased endurance;Difficulty walking   Rehab Potential Good   PT Frequency 2x / week   PT Duration 8 weeks   PT Treatment/Interventions ADLs/Self Care Home Management;Cryotherapy;Moist Heat;Ultrasound;Gait training;Stair training;Functional mobility training;Therapeutic activities;Therapeutic exercise;Manual techniques;Patient/family education;Neuromuscular re-education;Passive range of motion   PT Next Visit Plan UE/LE strength and endurance, standing tolerance, postural strength, pain management as needed.  Try arm bike.  Issue body mechanics and osteo info (already issued dos and donts to pt)   Consulted and Agree with Plan of Care Patient         Problem List Patient Active Problem List   Diagnosis Date Noted  . ICD (implantable cardioverter-defibrillator) in place 11/28/2012  . Kidney disease, chronic, stage III (GFR 30-59 ml/min) 10/19/2012  . 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 Back pain, thoracic 02/21/2012  . Ischemic cardiomyopathy 01/26/2012  . Carcinoma in situ of vulva   . Fibroid   . Ovarian cyst, left   . Osteoporosis, idiopathic   . 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 GERD (gastroesophageal reflux disease)   . Encounter for long-term (current) use of anticoagulants 06/08/2010  . Mitral valve disease 04/16/2010  . VITAMIN D DEFICIENCY 11/05/2008  . VITAMIN B12 DEFICIENCY 10/22/2008  . Essential hypertension, benign 07/30/2008  . Celiac disease 08/09/2007  . HYPERLIPIDEMIA 07/21/2007  . Atrial fibrillation ([place]) 07/21/2007  . CHOLELITHIASIS 07/21/2007  . UNSPECIFIED IRON DEFICIENCY ANEMIA 06/06/2007    TAKACS,KELLY, PT 04/03/2015, 11:49 AM  [facility] [address] [place], [place], [address] Phone: [phone]   Fax:  [phone]  Name: [name] MRN: [id number] Date of Birth: [birth year]

## 2015-04-07 ENCOUNTER — Ambulatory Visit: Payer: Medicare Other

## 2015-04-07 DIAGNOSIS — M4850XA Collapsed vertebra, not elsewhere classified, site unspecified, initial encounter for fracture: Secondary | ICD-10-CM | POA: Diagnosis not present

## 2015-04-07 DIAGNOSIS — M545 Low back pain, unspecified: Secondary | ICD-10-CM

## 2015-04-07 DIAGNOSIS — M810 Age-related osteoporosis without current pathological fracture: Secondary | ICD-10-CM | POA: Diagnosis not present

## 2015-04-07 DIAGNOSIS — R531 Weakness: Secondary | ICD-10-CM | POA: Diagnosis not present

## 2015-04-07 DIAGNOSIS — R293 Abnormal posture: Secondary | ICD-10-CM

## 2015-04-07 NOTE — Patient Instructions (Signed)
  Osteoporosis   What is Osteoporosis?  - A silent disease in which the skeleton is weakened by decreased bone density. - Characterized by low bone mass, deterioration of bone, and increased risk of fracture postmenopausal (primary) or the result of an identifiable condition/event (secondary) - Commonly found in the wrists, spine, and hips; these are high-risk stress areas and very susceptible to fractures.  The Facts: - There are 1.5 million fractures/year o 500,000 spine; 250,000 hip with over 60,000 nursing home admissions secondary to hip fracture; and 200,000 wrist - After hip fracture, only 50% of people able to walk independently prior to the fracture return to independent ambulation. - Bone mass: Peaks at age 20-30, and begins declining at age 40-50.   Osteoporosis is defined by the World Health Organization (WHO) as:  NOF/WHO Criteria for Interpreting Results of Bone Density Assessment  Results Diagnosis  Within 1 standard deviation (SD) of young adult mean Normal  Between 1 and -2.5 SD below mean, repeat in 2 years Low bone mass (osteopenia)  Greater than -2.5 SD below mean Osteoporosis  Greater than -2.5 SD below mean and one or more fragility fractures exist Severe Osteoporosis  *Results can be affected by positioning of the body in the DEXA scan, presence of current or old fractures, arthritis, extraneous calcifications.    Osteoporosis is not just a women's disease!  - 30-40% of women will develop osteoporosis - 5-15% of males will develop osteoporosis   What are the risk factors?  1. Female 2. Thin, small frame 3. Caucasian, Asian race 4. Early menopause (<45 years old)/amenorrhea/delayed puberty 5. Old age 6. Family history (fractures, stooped posture)\ 7. Low calcium diet 8. Sedentary lifestyle 9. Alcohol, Caffeine, Smoking 10. Malnutrition, GI Disease 11. Prolonged use of Glucocorticoids (Prednisone), Meds to treat asthma, arthritis, cancers,  thyroid, and anti-seizure meds.  How do I know for sure?  Get a BONE DENSITY TEST!  This measures bone loss and it's painless, non-invasive, and only takes 5-10 minutes!  What can I do about it?  ? Decrease your risk factors (alcohol, caffeine, smoking) ? Helpful medications (see next page) ? Adequate Calcium and Vitamin D intake ? Get active! o Proper posture - Sit and stand tall! No slouching or twisting o Weight-Bearing Exercise - walking, stair climbing, elliptical; NO jogging or high-impact exercise. o Resistive Exercise - Cybex weight equipment, Nautilus, dumbbells, therabands  **Be sure to maintain proper alignment when lifting any weight!!  **When using equipment, avoid abdominal exercises which involve "crunching" or curling or twisting the trunk, biceps machines, cross-country machines, moving handlebars, or ANY MACHINE WITH ROTATION OR FORWARD BENDING!!!           Approved Pharmacologic Management of Osteoporosis  Agent Approved for prevention Approved for treatment BMD increased spine/hip Fracture reduction  Estrogen/Hormone Therapy (Estrace, Estratab, Ogen, Premarin, Vivell, Prempro, Femhert, Orthoest) Yes Yes 3-6% 35% spine and hip  Bisphosphonates  (Fosamax, Actonel, Boniva) Yes Yes 3-8% 35-50% spine and non-spine  Calcitonin (Miacalcin, Calcimar, Fortical) No Yes 0-3% None stated  Raloxifene (Evista) Yes Yes 2-3% 30-55%  Parathyroid Hormone (Forteo) No Yes, only in those at high risk for fracture None stated 53-65%     Recommended Daily Calcium Intakes   Population Group NIH/NOF* (mg elemental calcium)  Children 1-10 years 800-1200  Children 11-24 years 1200-1500  Men and Women 25-64 years At least 1200  Pregnant/Lactating At least 1200  Postmenopausal women with hormone replacement therapy At least 1200  Postmenopausal women without     hormone replacement therapy At least 1200  Men and women 65 + At least 1200  *In 1987, 1990, 1994, and 2000,  the NIH held consensus conferences on osteoporosis and calcium.  This column shows the most recent recommendations regarding calcium intak for preventing and managing osteoporosis.          Calcium Content of Selected Foods  Dairy Foods Calcium Content (mg) Non-Dairy Foods Calcium Content (mg)  Buttermilk, 1 cup 300 Calcium-fortified juice, 1 cup 300  Milk, 1 cup 300 Salmon, canned with Bones, 2 oz 100  Lactaid milk, 1 cup 300-500 Oysters, raw 13-19 medium 226  Soy milk, 1 cup 200-300 Sardines, canned with bones, 3 oz 372  Yogurt (plain, lowfat) 1 cup 250-300 Shrimp, canned 3 oz 98  Frozen yogurt (fruit) 1 cup 200-600 Collard greens, cooked 1 cup 357  Cheddar, mozzarella, or [place] cheese, 1oz 205 Broccoli, cooked 1 cup 78  Cottage cheese (lowfat) 4 oz 200 Soybeans, cooked 1 cup 131  Part-skim ricotta cheese, 4oz 335 Tofu, 4oz* *  Vanilla ice cream, 1 cup 120-300    *Calcium content of tofu varies depending on processing method; check nutritional label on package for precise calcium content.     Suggested Guidelines for Calcium Supplement Use:  ? Calcium is absorbed most efficiently if taken in small amounts throughout the day.  Always divide the daily dose into smaller amounts if the total daily dose is 500mg or more per day.  The body cannot use more than 500mg Calcium at any one time. ? The use of manufactured supplements is encouraged.  Calcium as bone meal or dolomite may contain lead or other heavy metals as contaminants. ? Calcium supplements should not be taken with high fiber meals or with bulk forming laxatives. ? If calcium carbonate is used as the supplement form, it should be taken with meals to assure that stomach acid production is present to facilitate optimal dissolution and absorption of calcium.  This is important if atrophic gastritis with hypo- or achlorhydria is present, which it is in 20-50% of older individuals. ? It is important to drink plenty  of fluids while using the supplement to help reduce problems with side effects like constipation or bloating.  If these symptoms become a problem, switching to another form of supplement may be the answer. ? Another alternative is calcium-fortified foods, including fruit juices, cereals, and breads.  These foods are now marketed with added calcium and may be less likely to cause side effects. ? Those with personal or family histories of kidney stones should be monitored to assure that hypercalcuria does not occur. CALCIUM INTAKE QUIZ  Dairy products are the primary source of calcium for most people.  For a quick estimate of your daily calcium intake, complete the following steps:  1. Use the chart below to determine your daily intake of calcium from diary foods. Servings of dairy per day 1 2 3 4 5 6 7 8  Milligrams (mg) of calcium: 250 500 750 1000 1250 1500 1750 2000   2.  Enter your total daily calcium intake from dairy foods:     _____mg  3.  Add 350 mg, which is the average for all other dietary sources:                 +            350 mg  4.  The sum of your total daily calcium intake:                 ______mg  5.  Enter the recommended calcium intake for your age from the chart below;         ______mg  6.  Enter your daily intake from step 4 above and subtract:                             -        _______mg  7.  The result is how much additional calcium you need:                                          ______mg      Recommended Daily Calcium Intake  Population Calcium (mg)  Children 1-10 years [phone]  Children 11-24 years 33-1500  Men and women 25-64 At least 1200  Pregnant/Lactating At least 1200  Postmenopausal women with hormone replacement therapy At least 1200  Postmenopausal women without hormone replacement therapy At least 1200  Men and women 65+ At least 1200       SAFETY TIPS FOR FALL PREVENTION   1. Remove throw rugs and make certain carpet edges are  securely fastened to the floor.  2. Reduce clutter, especially in traffic areas of the home.   3. Install/maintain sturdy handrails at stairs.  4. Increase wattage of lighting in hallways, bathrooms, kitchens, stairwells, and entrances to home.  5. Use night-lights near bed, in hallways, and in bathroom to improve night safety.  6. Install safety handrails in shower, tub, and around toilet.  Bathtubs and shower stalls should have non-skid surfaces.  7. When you must reach for something high, use a safety step stool, one with wide steps and a friction surface to stand on.  A type equipped with a high handrail is preferred.  8. If a cane or other walking aid has been recommended, use it to help increase your stability.  9. Wear supportive, cushioned, low-heeled shoes.  Avoid "scuffs" (backless bedroom slippers) and high heels.  10. Avoid rushing to answer a phone, doorbell, or anything else!  A portable phone that you can take from room to room with you is a good idea for security and safety.  11. Exercise regularly and stay active!!    [place] [email or web address]   Exercise for Osteoporosis; A Safe and Effective Way to Build Bone Density and Muscle Strength By: Burnard Hawthorne, M.A.     Lifting Principles  .Maintain proper posture and head alignment. .Slide object as close as possible before lifting. .Move obstacles out of the way. .Test before lifting; ask for help if too heavy. .Tighten stomach muscles without holding breath. .Use smooth movements; do not jerk. .Use legs to do the work, and pivot with feet. .Distribute the work load symmetrically and close to the center of trunk. .Push instead of pull whenever possible.   Squat down and hold basket close to stand. Use leg muscles to do the work.    Avoid twisting or bending back. Pivot around using foot movements, and bend at knees if needed when reaching for  articles.        Getting Into / Out of Bed   Lower self to lie down on one side by raising legs and lowering head at the same time. Use arms to assist moving without twisting. Bend both knees to roll onto back if desired. To sit  up, start from lying on side, and use same move-ments in reverse. Keep trunk aligned with legs.    Shift weight from front foot to back foot as item is lifted off shelf.    When leaning forward to pick object up from floor, extend one leg out behind. Keep back straight. Hold onto a sturdy support with other hand.      Sit upright, head facing forward. Try using a roll to support lower back. Keep shoulders relaxed, and avoid rounded back. Keep hips level with knees. Avoid crossing legs for long periods.    [place] [address], [place], [place] [address] Phone # [phone] Fax [phone]

## 2015-04-07 NOTE — Therapy (Signed)
[facility] [address], [place], [place], [address] Phone: [phone]   Fax:  [phone]  Physical Therapy Treatment  Patient Details  Name: [name] MRN: [id number] Date of Birth: [birth year] Referring Provider: [name], MD  Encounter Date: 04/07/2015      PT End of Session - 04/07/15 1521    Visit Number 3   Number of Visits 10   Date for PT Re-Evaluation 05/27/15   PT Start Time [id number]   PT Stop Time 1526   PT Time Calculation (min) 43 min   Equipment Utilized During Treatment Back brace   Activity Tolerance Patient tolerated treatment well   Behavior During Therapy [facility] for tasks assessed/performed      Past Medical History  Diagnosis Date  . Anemia   . Atrial fibrillation [facility])     A.fib/flutter: s/p MAZE 2008, DCCV 2011, on coumadin; previously on flecainide, but dc'd due to worsening EF. Tikosyn discontinued 06/2012 after cardiac arrest.  . Diverticulitis   . CVA (cerebral vascular accident) ([place])     2008 - felt due to oscillating calcium on aortic valve  . Carcinoma in situ of vulva   . Fibroid   . Ovarian cyst, left 2008-2009  . Osteoporosis   . Diabetes mellitus   . Hypertension   . GERD (gastroesophageal reflux disease)   . Chronic systolic CHF (congestive heart failure) (HCC)     EF 35%  . Ischemic cardiomyopathy     EF 35%  . Coronary artery disease     Occluded LM 2/2 previous aortic root surgery; s/p 2v CABG 2010 LIMA to LAD, SVG to OM; Cath 03/11/12 patent SVG to OM, atretic LIMA to LAD, patent RCA, occluded native LM, EF 35%   . Arthritis     OSTEO  . Fracture of lumbar spine ([place])   . Ventricular fibrillation ([place])     VF arrest/VT/torsades 06/2012 with prolonged hosp with VDRF, cardiogen shock, asp PNA, encephalopathy, anemia, shock liver, hypernatremia - s/p ICD implantation 06/08/12.  . Superficial venous thrombosis of upper extremity     06/2012 - LUE  . Severe aortic  stenosis     s/p homograft aortic root replacement 2008  . Mitral regurgitation     s/p mitral valve repair 2008  . Thoracic aortic aneurysm [facility])     s/p resection and grafting 2008  . Stroke ([place])   . Kidney stones   . Dysrhythmia   . AICD (automatic cardioverter/defibrillator) present     2014    Past Surgical History  Procedure Laterality Date  . Foot surgery Left     removed neuroma  . Aortic root replacement  2008    homograft  . Mitral valve replacement  2008    due to severe MR  . Resection and grafting of ascending thoracic aortic    . Aneurysm and left sided maze  2008  . Coronary artery bypass graft  4/10    LIMA to LAD, SVG to OM  . Wide excision of vulva      CA INSITU  . Cardiac defibrillator placement  06/08/2012    [facility] [place] DR  implanted by Dr Rayann Heman following a cardiac arrest  . Right heart catheterization N/A 04/03/2012    Procedure: RIGHT HEART CATH;  Surgeon: [name], MD;  Location: [facility] CATH LAB;  Service: Cardiovascular;  Laterality: N/A;  . Implantable cardioverter defibrillator implant N/A 06/08/2012    Procedure: IMPLANTABLE CARDIOVERTER  DEFIBRILLATOR IMPLANT;  Surgeon: Thompson Grayer, MD;  Location: [facility] CATH LAB;  Service: Cardiovascular;  Laterality: N/A;  . Cataract extraction    . Eye surgery    . Kyphoplasty N/A 12/11/2014    Procedure: KYPHOPLASTY T-12;  Surgeon: Karie Chimera, MD;  Location: [place] NEURO ORS;  Service: Neurosurgery;  Laterality: N/A;  KYPHOPLASTY T-12    There were no vitals filed for this visit.  Visit Diagnosis:  Osteoporosis  Compression fracture of spine, non-traumatic, initial encounter ([place])  Bilateral low back pain without sciatica  Posture abnormality  Weakness      Subjective Assessment - 04/07/15 1438    Subjective Pt is using walker today and walked into Medical supply store with walker earlier today.  Pt bought a lumbar compression wrap at medical supply today.     Currently in Pain? Yes    Pain Score 1    Pain Location Back   Pain Orientation Right;Left;Lower   Pain Descriptors / Indicators Aching;Throbbing   Pain Type Chronic pain   Pain Onset More than a month ago   Pain Frequency Constant   Aggravating Factors  standing and walking, sitting too long   Pain Relieving Factors heat, sitting                         [facility] Adult PT Treatment/Exercise - 04/07/15 0001    Knee/Hip Exercises: Aerobic   Nustep Level 1 x 8 minutes  seat 8, arms 9   Knee/Hip Exercises: Standing   Heel Raises Both;20 reps   Hip Abduction Stengthening;2 sets;10 reps   Hip Extension Stengthening;Both;2 sets;10 reps   Knee/Hip Exercises: Seated   Long Arc Quad Both;2 sets;10 reps;Strengthening   Other Seated Knee/Hip Exercises heel/toe raises 2x10   Marching Strengthening;Both;2 sets;10 reps   Shoulder Exercises: Seated   Row 20 reps;Theraband   Theraband Level (Shoulder Row) Level 1 (Yellow)   Shoulder Exercises: ROM/Strengthening   UBE (Upper Arm Bike) Level 1 x 6 minutes (3/3)                PT Education - 04/07/15 1459    Education provided Yes   Education Details osteo info, body mechanics education    Person(s) Educated Patient   Methods Explanation;Handout;Demonstration   Comprehension Verbalized understanding          PT Short Term Goals - 04/07/15 1444    PT SHORT TERM GOAL #1   Title be independent in initial HEP   Status Achieved   PT SHORT TERM GOAL #2   Title stand for 10 minutes to prepare an easy meal without need to sit   Time 4   Period Weeks   Status On-going  still limited due to pain and endurance deficits   PT SHORT TERM GOAL #3   Title ambulate into and out of the clinic with or without device at least 50% of the time   Status Achieved   PT SHORT TERM GOAL #4   Title report a 25% reduction in LBP with standing for ADLs and self-care   Time 4   Period Weeks   Status On-going           PT Long Term Goals - 04/01/15 1000     PT LONG TERM GOAL #1   Title be independent in advanced HEP   Time 8   Period Weeks   Status New   PT LONG TERM GOAL #2   Title reduce FOTO to <  or = to 51% limitation   Time 8   Period Weeks   Status New   PT LONG TERM GOAL #3   Title stand at home for cooking and ADLs for 15 minutes without need to rest   Time 8   Period Weeks   Status New   PT LONG TERM GOAL #4   Title increase LE strength to ambulate in the community for > or = to 5-10 minutes without significant fatigue   Time 8   Period Weeks   Status New   PT LONG TERM GOAL #5   Title report a 50% reduction in LBP with standing for ADLs and self-care   Time 8   Period Weeks   Status New               Plan - 04/07/15 1445    Clinical Impression Statement Pt ambulated into the clinic with rolling walker today and used for entire PT session.  Pt with significant thoracic kyphosis, rounded shoulders and weak abdominals.  Pt with continued limitation with standing long periods due to pain and endurance/strength deficits.  Pt will continue to benefit from skilled PT for strength, endurance, gait and posture re-education.     Pt will benefit from skilled therapeutic intervention in order to improve on the following deficits Pain;Postural dysfunction;Decreased strength;Decreased mobility;Impaired flexibility;Improper body mechanics;Decreased activity tolerance;Decreased endurance;Difficulty walking   Rehab Potential Good   PT Frequency 2x / week   PT Duration 8 weeks   PT Treatment/Interventions ADLs/Self Care Home Management;Cryotherapy;Moist Heat;Ultrasound;Gait training;Stair training;Functional mobility training;Therapeutic activities;Therapeutic exercise;Manual techniques;Patient/family education;Neuromuscular re-education;Passive range of motion   PT Next Visit Plan UE/LE strength and endurance, standing tolerance, postural strength, pain management as needed.   Add standing hip exercises to HEP        Problem  List Patient Active Problem List   Diagnosis Date Noted  . ICD (implantable cardioverter-defibrillator) in place 11/28/2012  . Kidney disease, chronic, stage III (GFR 30-59 ml/min) 10/19/2012  . 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 Back pain, thoracic 02/21/2012  . Ischemic cardiomyopathy 01/26/2012  . Carcinoma in situ of vulva   . Fibroid   . Ovarian cyst, left   . Osteoporosis, idiopathic   . 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 GERD (gastroesophageal reflux disease)   . Encounter for long-term (current) use of anticoagulants 06/08/2010  . Mitral valve disease 04/16/2010  . VITAMIN D DEFICIENCY 11/05/2008  . VITAMIN B12 DEFICIENCY 10/22/2008  . Essential hypertension, benign 07/30/2008  . Celiac disease 08/09/2007  . HYPERLIPIDEMIA 07/21/2007  . Atrial fibrillation ([place]) 07/21/2007  . CHOLELITHIASIS 07/21/2007  . UNSPECIFIED IRON DEFICIENCY ANEMIA 06/06/2007    TAKACS,KELLY, PT 04/07/2015, 3:23 PM  [facility] [address], [place], [place], [address] Phone: [phone]   Fax:  [phone]  Name: [name] MRN: [id number] Date of Birth: 04-28-1940

## 2015-04-09 ENCOUNTER — Ambulatory Visit (INDEPENDENT_AMBULATORY_CARE_PROVIDER_SITE_OTHER): Payer: Medicare Other | Admitting: Internal Medicine

## 2015-04-09 ENCOUNTER — Encounter: Payer: Self-pay | Admitting: Internal Medicine

## 2015-04-09 VITALS — BP 120/62 | HR 72 | Ht 63.0 in | Wt 156.0 lb

## 2015-04-09 DIAGNOSIS — I10 Essential (primary) hypertension: Secondary | ICD-10-CM

## 2015-04-09 DIAGNOSIS — I255 Ischemic cardiomyopathy: Secondary | ICD-10-CM | POA: Diagnosis not present

## 2015-04-09 DIAGNOSIS — I4819 Other persistent atrial fibrillation: Secondary | ICD-10-CM

## 2015-04-09 DIAGNOSIS — I481 Persistent atrial fibrillation: Secondary | ICD-10-CM

## 2015-04-09 NOTE — Patient Instructions (Signed)
Medication Instructions:  Your physician recommends that you continue on your current medications as directed. Please refer to the Current Medication list given to you today.  Labwork: None ordered  Testing/Procedures: None ordered  Follow-Up: Remote monitoring is used to monitor your Pacemaker of ICD from home. This monitoring reduces the number of office visits required to check your device to one time per year. It allows [place] to keep an eye on the functioning of your device to ensure it is working properly. You are scheduled for a device check from home on 07/09/2015. You may send your transmission at any time that day. If you have a wireless device, the transmission will be sent automatically. After your physician reviews your transmission, you will receive a postcard with your next transmission date.  Your physician wants you to follow-up in: 6 months with Dr. Stanford Breed. You will receive a reminder letter in the mail two months in advance. If you don't receive a letter, please call our office to schedule the follow-up appointment.  Your physician wants you to follow-up in: 1 year with Dr. Rayann Heman. You will receive a reminder letter in the mail two months in advance. If you don't receive a letter, please call our office to schedule the follow-up appointment.  If you need a refill on your cardiac medications before your next appointment, please call your pharmacy.  Thank you for choosing [facility]!!

## 2015-04-10 ENCOUNTER — Ambulatory Visit: Payer: Medicare Other | Attending: Sports Medicine

## 2015-04-10 DIAGNOSIS — M4850XA Collapsed vertebra, not elsewhere classified, site unspecified, initial encounter for fracture: Secondary | ICD-10-CM | POA: Insufficient documentation

## 2015-04-10 DIAGNOSIS — R531 Weakness: Secondary | ICD-10-CM | POA: Diagnosis not present

## 2015-04-10 DIAGNOSIS — M545 Low back pain, unspecified: Secondary | ICD-10-CM

## 2015-04-10 DIAGNOSIS — M810 Age-related osteoporosis without current pathological fracture: Secondary | ICD-10-CM

## 2015-04-10 DIAGNOSIS — R293 Abnormal posture: Secondary | ICD-10-CM | POA: Diagnosis not present

## 2015-04-10 NOTE — Progress Notes (Signed)
Electrophysiology Office Note   Date:  04/10/2015   ID:  [name], DOB 1941/02/24, MRN [id number]  PCP:  Velna Hatchet, MD  Cardiologist:  Dr Stanford Breed Primary Electrophysiologist: Thompson Grayer, MD    Chief Complaint  Patient presents with  . Atrial Fibrillation     History of Present Illness: [name] is a 75 y.o. female who presents today for electrophysiology evaluation.   Doing well at this time.  She is maintaining sinus rhythm  SOB is stable.   Today, she denies symptoms of palpitations, chest pain,  orthopnea, PND, lower extremity edema, claudication, dizziness, presyncope, syncope, bleeding, or neurologic sequela. The patient is tolerating medications without difficulties and is otherwise without complaint today.    Past Medical History  Diagnosis Date  . Anemia   . Atrial fibrillation [facility])     A.fib/flutter: s/p MAZE 2008, DCCV 2011, on coumadin; previously on flecainide, but dc'd due to worsening EF. Tikosyn discontinued 06/2012 after cardiac arrest.  . Diverticulitis   . CVA (cerebral vascular accident) ([place])     2008 - felt due to oscillating calcium on aortic valve  . Carcinoma in situ of vulva   . Fibroid   . Ovarian cyst, left 2008-2009  . Osteoporosis   . Diabetes mellitus   . Hypertension   . GERD (gastroesophageal reflux disease)   . Chronic systolic CHF (congestive heart failure) (HCC)     EF 35%  . Ischemic cardiomyopathy     EF 35%  . Coronary artery disease     Occluded LM 2/2 previous aortic root surgery; s/p 2v CABG 2010 LIMA to LAD, SVG to OM; Cath 03/11/12 patent SVG to OM, atretic LIMA to LAD, patent RCA, occluded native LM, EF 35%   . Arthritis     OSTEO  . Fracture of lumbar spine ([place])   . Ventricular fibrillation ([place])     VF arrest/VT/torsades 06/2012 with prolonged hosp with VDRF, cardiogen shock, asp PNA, encephalopathy, anemia, shock liver, hypernatremia - s/p ICD implantation 06/08/12.  . Superficial venous thrombosis of upper  extremity     06/2012 - LUE  . Severe aortic stenosis     s/p homograft aortic root replacement 2008  . Mitral regurgitation     s/p mitral valve repair 2008  . Thoracic aortic aneurysm [facility])     s/p resection and grafting 2008  . Stroke ([place])   . Kidney stones   . Dysrhythmia   . AICD (automatic cardioverter/defibrillator) present     2014   Past Surgical History  Procedure Laterality Date  . Foot surgery Left     removed neuroma  . Aortic root replacement  2008    homograft  . Mitral valve replacement  2008    due to severe MR  . Resection and grafting of ascending thoracic aortic    . Aneurysm and left sided maze  2008  . Coronary artery bypass graft  4/10    LIMA to LAD, SVG to OM  . Wide excision of vulva      CA INSITU  . Cardiac defibrillator placement  06/08/2012    [facility] [place] DR  implanted by Dr Rayann Heman following a cardiac arrest  . Right heart catheterization N/A 04/03/2012    Procedure: RIGHT HEART CATH;  Surgeon: [name], MD;  Location: [facility] CATH LAB;  Service: Cardiovascular;  Laterality: N/A;  . Implantable cardioverter defibrillator implant N/A 06/08/2012    Procedure: IMPLANTABLE CARDIOVERTER DEFIBRILLATOR IMPLANT;  Surgeon: Thompson Grayer, MD;  Location: [facility] CATH LAB;  Service: Cardiovascular;  Laterality: N/A;  . Cataract extraction    . Eye surgery    . Kyphoplasty N/A 12/11/2014    Procedure: KYPHOPLASTY T-12;  Surgeon: Karie Chimera, MD;  Location: [place] NEURO ORS;  Service: Neurosurgery;  Laterality: N/A;  KYPHOPLASTY T-12     Current Outpatient Prescriptions  Medication Sig Dispense Refill  . carvedilol (COREG) 12.5 MG tablet TAKE 1 TABLET (12.5 MG TOTAL) BY MOUTH 2 (TWO) TIMES DAILY WITH A MEAL. 180 tablet 3  . Cyanocobalamin (VITAMIN B-12) 2500 MCG SUBL Place 1 tablet under the tongue every Monday, Wednesday, and Friday.     . ergocalciferol (VITAMIN D2) 50000 UNITS capsule Take 50,000 Units by mouth once a week. Monday    .  furosemide (LASIX) 20 MG tablet TAKE 1 TABLET BY MOUTH EVERY DAY 90 tablet 3  . HYDROcodone-acetaminophen (NORCO/VICODIN) 5-325 MG per tablet Take 1-2 tablets by mouth every 4 (four) hours as needed for severe pain. 18 tablet 0  . linagliptin (TRADJENTA) 5 MG TABS tablet Take 1 tablet (5 mg total) by mouth daily. 30 tablet   . losartan (COZAAR) 50 MG tablet TAKE 1 TABLET (50 MG TOTAL) BY MOUTH DAILY. 90 tablet 2  . NON FORMULARY ([employer]) Take 500 mg of elemental calcium by mouth at bedtime    . omeprazole (PRILOSEC OTC) 20 MG tablet Take 20 mg by mouth daily before breakfast.     . PACERONE 200 MG tablet TAKE 1 TABLET (200 MG TOTAL) BY MOUTH DAILY. 90 tablet 2  . Psyllium (METAMUCIL PO) Take 1 teaspoon by mouth daily with water    . warfarin (COUMADIN) 3 MG tablet Take as directed by [facility]madin Clinic 30 tablet 3  . Teriparatide, Recombinant, (FORTEO) 600 MCG/2.4ML SOLN Inject 20 mcg into the skin daily.     No current facility-administered medications for this visit.    Allergies:   Metformin and related; Tikosyn; and Caffeine   Social History:  The patient  reports that she quit smoking about 32 years ago. Her smoking use included Cigarettes. She has never used smokeless tobacco. She reports that she does not drink alcohol or use illicit drugs.   Family History:  The patient's family history includes Breast cancer in her maternal aunt; Colon cancer in her paternal grandmother; Diabetes in her maternal uncle and paternal uncle; Emphysema in her father; Hypertension in her mother.    ROS:  Please see the history of present illness.   All other systems are reviewed and negative.    PHYSICAL EXAM: VS:  BP 120/62 mmHg  Pulse 72  Ht 5\' 3"  (1.6 m)  Wt 156 lb (70.761 kg)  BMI 27.64 kg/m2 , BMI Body mass index is 27.64 kg/(m^2). GEN: Well nourished, well developed, in no acute distress HEENT: normal Neck: no JVD, carotid bruits, or masses Cardiac: RRR; no murmurs, rubs, or gallops,no edema   Respiratory:  clear to auscultation bilaterally, normal work of breathing GI: soft, nontender, nondistended, + BS MS: no deformity or atrophy Skin: warm and dry, device pocket is well healed Neuro:  Strength and sensation are intact Psych: euthymic mood, full affect  Device interrogation is reviewed today in detail.  See PaceArt for details.   Recent Labs: 03/20/2015: ALT 12; BUN 21; Creat 1.53*; Hemoglobin 12.0; Platelets 322; Potassium 4.4; Sodium 139; TSH 4.993*    Lipid Panel     Component Value Date/Time   CHOL 169 04/04/2014 1019  TRIG 141.0 04/04/2014 1019   HDL 59.50 04/04/2014 1019   CHOLHDL 3 04/04/2014 1019   VLDL 28.2 04/04/2014 1019   LDLCALC 81 04/04/2014 1019   LDLDIRECT 132.3 10/17/2008 0827     Wt Readings from Last 3 Encounters:  04/09/15 156 lb (70.761 kg)  03/20/15 160 lb 3.2 oz (72.666 kg)  12/11/14 169 lb 8 oz (76.885 kg)      ASSESSMENT AND PLAN:  1. afib Well controlled with amiodarone Continue coumadin long term  2. S/p VF arrest Normal ICD function See Pace Art report No changes today  3. Valvular heart disease/ ischemic CM euvolemic is following Sharman Cheek to follow with monthly ICMs  Merlin Return to see me in 1 year Follow-up with Dr Stanford Breed as scheduled   Current medicines are reviewed at length with the patient today.   The patient does not have concerns regarding her medicines.  The following changes were made today:  none  Signed, Thompson Grayer, MD    [place] [address] [phone] (office) [phone] (fax)

## 2015-04-10 NOTE — Patient Instructions (Signed)
   ABDUCTION: Standing (Active)   Stand, feet flat. Lift right leg out to side. Use _0__ lbs. Complete __10_ repetitions. Perform __2_ sessions per day.     EXTENSION: Standing (Active)  Stand, both feet flat. Draw right leg behind body as far as possible. Use 0___ lbs. Complete 10 repetitions. Perform __2_ sessions per day.  Copyright  VHI. All rights reserved.   [place] [address] [place], [place] [address] Phone # [phone] Fax [phone]

## 2015-04-10 NOTE — Therapy (Signed)
[facility] [address] [place], [place], [address] Phone: [phone]   Fax:  [phone]  Physical Therapy Treatment  Patient Details  Name: [name] MRN: [id number] Date of Birth: 1941-01-30 Referring Provider: [name], MD  Encounter Date: 04/10/2015      PT End of Session - 04/10/15 1140    Visit Number 4   Number of Visits 10   Date for PT Re-Evaluation 05/27/15   PT Start Time 1102   PT Stop Time 1143   PT Time Calculation (min) 41 min   Equipment Utilized During Treatment Back brace   Activity Tolerance Patient tolerated treatment well   Behavior During Therapy [facility] for tasks assessed/performed      Past Medical History  Diagnosis Date  . Anemia   . Atrial fibrillation [facility])     A.fib/flutter: s/p MAZE 2008, DCCV 2011, on coumadin; previously on flecainide, but dc'd due to worsening EF. Tikosyn discontinued 06/2012 after cardiac arrest.  . Diverticulitis   . CVA (cerebral vascular accident) ([place])     2008 - felt due to oscillating calcium on aortic valve  . Carcinoma in situ of vulva   . Fibroid   . Ovarian cyst, left 2008-2009  . Osteoporosis   . Diabetes mellitus   . Hypertension   . GERD (gastroesophageal reflux disease)   . Chronic systolic CHF (congestive heart failure) (HCC)     EF 35%  . Ischemic cardiomyopathy     EF 35%  . Coronary artery disease     Occluded LM 2/2 previous aortic root surgery; s/p 2v CABG 2010 LIMA to LAD, SVG to OM; Cath 03/11/12 patent SVG to OM, atretic LIMA to LAD, patent RCA, occluded native LM, EF 35%   . Arthritis     OSTEO  . Fracture of lumbar spine ([place])   . Ventricular fibrillation ([place])     VF arrest/VT/torsades 06/2012 with prolonged hosp with VDRF, cardiogen shock, asp PNA, encephalopathy, anemia, shock liver, hypernatremia - s/p ICD implantation 06/08/12.  . Superficial venous thrombosis of upper extremity     06/2012 - LUE  . Severe aortic  stenosis     s/p homograft aortic root replacement 2008  . Mitral regurgitation     s/p mitral valve repair 2008  . Thoracic aortic aneurysm [facility])     s/p resection and grafting 2008  . Stroke ([place])   . Kidney stones   . Dysrhythmia   . AICD (automatic cardioverter/defibrillator) present     2014    Past Surgical History  Procedure Laterality Date  . Foot surgery Left     removed neuroma  . Aortic root replacement  2008    homograft  . Mitral valve replacement  2008    due to severe MR  . Resection and grafting of ascending thoracic aortic    . Aneurysm and left sided maze  2008  . Coronary artery bypass graft  4/10    LIMA to LAD, SVG to OM  . Wide excision of vulva      CA INSITU  . Cardiac defibrillator placement  06/08/2012    [facility] [place] [address]  implanted by Dr Rayann Heman following a cardiac arrest  . Right heart catheterization N/A 04/03/2012    Procedure: RIGHT HEART CATH;  Surgeon: [name], MD;  Location: [facility] CATH LAB;  Service: Cardiovascular;  Laterality: N/A;  . Implantable cardioverter defibrillator implant N/A 06/08/2012    Procedure: IMPLANTABLE CARDIOVERTER  DEFIBRILLATOR IMPLANT;  Surgeon: Thompson Grayer, MD;  Location: [facility] CATH LAB;  Service: Cardiovascular;  Laterality: N/A;  . Cataract extraction    . Eye surgery    . Kyphoplasty N/A 12/11/2014    Procedure: KYPHOPLASTY T-12;  Surgeon: Karie Chimera, MD;  Location: [place] NEURO ORS;  Service: Neurosurgery;  Laterality: N/A;  KYPHOPLASTY T-12    There were no vitals filed for this visit.  Visit Diagnosis:  Osteoporosis  Compression fracture of spine, non-traumatic, initial encounter ([place])  Bilateral low back pain without sciatica  Posture abnormality  Weakness      Subjective Assessment - 04/10/15 1126    Subjective Using walking now most of the time.     Currently in Pain? Yes   Pain Score 1    Pain Location Back   Pain Orientation Right;Left;Lower   Pain Descriptors / Indicators  Aching;Throbbing   Pain Type Chronic pain                         [facility] Adult PT Treatment/Exercise - 04/10/15 0001    Knee/Hip Exercises: Aerobic   Nustep Level 1 x 8 minutes  seat 8, arms 9   Knee/Hip Exercises: Standing   Hip Abduction Stengthening;2 sets;10 reps   Hip Extension Stengthening;Both;2 sets;10 reps   Knee/Hip Exercises: Seated   Long Arc Quad Both;2 sets;10 reps;Strengthening;Weights   Long Arc Quad Weight 2 lbs.   Other Seated Knee/Hip Exercises heel/toe raises 2x10   Marching Strengthening;Both;2 sets;10 reps;Weights   Marching Weights 2 lbs.   Shoulder Exercises: Seated   Row 20 reps;Theraband   Theraband Level (Shoulder Row) Level 1 (Yellow)   Flexion Strengthening;Both;20 reps;Weights   Flexion Weight (lbs) 1   Shoulder Exercises: ROM/Strengthening   UBE (Upper Arm Bike) Level 1 x 6 minutes (3/3)                PT Education - 04/10/15 1131    Education provided Yes   Education Details standing hip abduction and extension   Person(s) Educated Patient   Methods Explanation;Handout;Demonstration   Comprehension Verbalized understanding;Returned demonstration          PT Short Term Goals - 04/07/15 1444    PT SHORT TERM GOAL #1   Title be independent in initial HEP   Status Achieved   PT SHORT TERM GOAL #2   Title stand for 10 minutes to prepare an easy meal without need to sit   Time 4   Period Weeks   Status On-going  still limited due to pain and endurance deficits   PT SHORT TERM GOAL #3   Title ambulate into and out of the clinic with or without device at least 50% of the time   Status Achieved   PT SHORT TERM GOAL #4   Title report a 25% reduction in LBP with standing for ADLs and self-care   Time 4   Period Weeks   Status On-going           PT Long Term Goals - 04/01/15 1000    PT LONG TERM GOAL #1   Title be independent in advanced HEP   Time 8   Period Weeks   Status New   PT LONG TERM GOAL #2    Title reduce FOTO to < or = to 51% limitation   Time 8   Period Weeks   Status New   PT LONG TERM GOAL #3   Title stand at home for  cooking and ADLs for 15 minutes without need to rest   Time 8   Period Weeks   Status New   PT LONG TERM GOAL #4   Title increase LE strength to ambulate in the community for > or = to 5-10 minutes without significant fatigue   Time 8   Period Weeks   Status New   PT LONG TERM GOAL #5   Title report a 50% reduction in LBP with standing for ADLs and self-care   Time 8   Period Weeks   Status New               Plan - 04/10/15 1127    Clinical Impression Statement Pt is using walker for all distances today. Pt tolerated increased weights on legs and with arm exercises in the clinic today.  Pt with significant thoracic kyphosis, rounded shoulders and weak abdominals.  Pt requires frequent cues for posture and alignment with exercise in the clnic. Pt with continued with standing long periods due to pain and endurance/strength deficits.  Pt will continue to benefit from skilled PT for strength, endurance, gait and posture re-education.     Pt will benefit from skilled therapeutic intervention in order to improve on the following deficits Pain;Postural dysfunction;Decreased strength;Decreased mobility;Impaired flexibility;Improper body mechanics;Decreased activity tolerance;Decreased endurance;Difficulty walking   Rehab Potential Good   PT Frequency 2x / week   PT Duration 8 weeks   PT Treatment/Interventions ADLs/Self Care Home Management;Cryotherapy;Moist Heat;Ultrasound;Gait training;Stair training;Functional mobility training;Therapeutic activities;Therapeutic exercise;Manual techniques;Patient/family education;Neuromuscular re-education;Passive range of motion   PT Next Visit Plan UE/LE strength and endurance, standing tolerance, postural strength, pain management as needed.      Consulted and Agree with Plan of Care Patient        Problem  List Patient Active Problem List   Diagnosis Date Noted  . ICD (implantable cardioverter-defibrillator) in place 11/28/2012  . Kidney disease, chronic, stage III (GFR 30-59 ml/min) 10/19/2012  . 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 Back pain, thoracic 02/21/2012  . Ischemic cardiomyopathy 01/26/2012  . Carcinoma in situ of vulva   . Fibroid   . Ovarian cyst, left   . Osteoporosis, idiopathic   . 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 GERD (gastroesophageal reflux disease)   . Encounter for long-term (current) use of anticoagulants 06/08/2010  . Mitral valve disease 04/16/2010  . VITAMIN D DEFICIENCY 11/05/2008  . VITAMIN B12 DEFICIENCY 10/22/2008  . Essential hypertension, benign 07/30/2008  . Celiac disease 08/09/2007  . HYPERLIPIDEMIA 07/21/2007  . Atrial fibrillation ([place]) 07/21/2007  . CHOLELITHIASIS 07/21/2007  . UNSPECIFIED IRON DEFICIENCY ANEMIA 06/06/2007    TAKACS,KELLY, PT 04/10/2015, 11:42 AM  [facility] [address] [place], [place], [address] Phone: [phone]   Fax:  [phone]  Name: [name] MRN: [id number] Date of Birth: 20-Apr-1940

## 2015-04-11 ENCOUNTER — Other Ambulatory Visit: Payer: Self-pay | Admitting: *Deleted

## 2015-04-11 LAB — CUP PACEART INCLINIC DEVICE CHECK
Battery Remaining Longevity: 75.6
Brady Statistic RA Percent Paced: 37 %
HIGH POWER IMPEDANCE MEASURED VALUE: 58.5 Ohm
Implantable Lead Implant Date: 20140403
Implantable Lead Location: 753859
Lead Channel Impedance Value: 450 Ohm
Lead Channel Impedance Value: 650 Ohm
Lead Channel Pacing Threshold Amplitude: 0.75 V
Lead Channel Pacing Threshold Amplitude: 0.75 V
Lead Channel Pacing Threshold Pulse Width: 0.5 ms
Lead Channel Sensing Intrinsic Amplitude: 12 mV
Lead Channel Setting Pacing Amplitude: 2 V
Lead Channel Setting Pacing Amplitude: 2.5 V
Lead Channel Setting Pacing Pulse Width: 0.5 ms
Lead Channel Setting Sensing Sensitivity: 0.5 mV
MDC IDC LEAD IMPLANT DT: 20140403
MDC IDC LEAD LOCATION: 753860
MDC IDC MSMT LEADCHNL RA PACING THRESHOLD AMPLITUDE: 0.75 V
MDC IDC MSMT LEADCHNL RA PACING THRESHOLD PULSEWIDTH: 0.5 ms
MDC IDC MSMT LEADCHNL RA SENSING INTR AMPL: 2.1 mV
MDC IDC MSMT LEADCHNL RV PACING THRESHOLD AMPLITUDE: 0.75 V
MDC IDC MSMT LEADCHNL RV PACING THRESHOLD PULSEWIDTH: 0.5 ms
MDC IDC MSMT LEADCHNL RV PACING THRESHOLD PULSEWIDTH: 0.5 ms
MDC IDC SESS DTM: 20170201212049
MDC IDC STAT BRADY RV PERCENT PACED: 0 %
Pulse Gen Serial Number: 7080516

## 2015-04-11 MED ORDER — FUROSEMIDE 20 MG PO TABS: 20.0000 mg | ORAL_TABLET | Freq: Every day | ORAL | Status: DC

## 2015-04-14 ENCOUNTER — Ambulatory Visit: Payer: Medicare Other

## 2015-04-14 ENCOUNTER — Ambulatory Visit (INDEPENDENT_AMBULATORY_CARE_PROVIDER_SITE_OTHER): Payer: Medicare Other

## 2015-04-14 DIAGNOSIS — M545 Low back pain, unspecified: Secondary | ICD-10-CM

## 2015-04-14 DIAGNOSIS — M4850XA Collapsed vertebra, not elsewhere classified, site unspecified, initial encounter for fracture: Secondary | ICD-10-CM | POA: Diagnosis not present

## 2015-04-14 DIAGNOSIS — R293 Abnormal posture: Secondary | ICD-10-CM

## 2015-04-14 DIAGNOSIS — I359 Nonrheumatic aortic valve disorder, unspecified: Secondary | ICD-10-CM

## 2015-04-14 DIAGNOSIS — I4891 Unspecified atrial fibrillation: Secondary | ICD-10-CM | POA: Diagnosis not present

## 2015-04-14 DIAGNOSIS — I059 Rheumatic mitral valve disease, unspecified: Secondary | ICD-10-CM | POA: Diagnosis not present

## 2015-04-14 DIAGNOSIS — M810 Age-related osteoporosis without current pathological fracture: Secondary | ICD-10-CM

## 2015-04-14 DIAGNOSIS — R531 Weakness: Secondary | ICD-10-CM | POA: Diagnosis not present

## 2015-04-14 DIAGNOSIS — Z7901 Long term (current) use of anticoagulants: Secondary | ICD-10-CM | POA: Diagnosis not present

## 2015-04-14 LAB — POCT INR: INR: 2.3

## 2015-04-14 NOTE — Therapy (Signed)
[facility] [address] [place], [place], [address] Phone: [phone]   Fax:  [phone]  Physical Therapy Treatment  Patient Details  Name: [name] MRN: [id number] Date of Birth: 09/11/1940 Referring Provider: [name], MD  Encounter Date: 04/14/2015      PT End of Session - 04/14/15 1140    Visit Number 5   Date for PT Re-Evaluation 05/27/15   PT Start Time 1103   PT Stop Time 1146   PT Time Calculation (min) 43 min   Activity Tolerance Patient tolerated treatment well   Behavior During Therapy [facility] for tasks assessed/performed      Past Medical History  Diagnosis Date  . Anemia   . Atrial fibrillation [facility])     A.fib/flutter: s/p MAZE 2008, DCCV 2011, on coumadin; previously on flecainide, but dc'd due to worsening EF. Tikosyn discontinued 06/2012 after cardiac arrest.  . Diverticulitis   . CVA (cerebral vascular accident) ([place])     2008 - felt due to oscillating calcium on aortic valve  . Carcinoma in situ of vulva   . Fibroid   . Ovarian cyst, left 2008-2009  . Osteoporosis   . Diabetes mellitus   . Hypertension   . GERD (gastroesophageal reflux disease)   . Chronic systolic CHF (congestive heart failure) (HCC)     EF 35%  . Ischemic cardiomyopathy     EF 35%  . Coronary artery disease     Occluded LM 2/2 previous aortic root surgery; s/p 2v CABG 2010 LIMA to LAD, SVG to OM; Cath 03/11/12 patent SVG to OM, atretic LIMA to LAD, patent RCA, occluded native LM, EF 35%   . Arthritis     OSTEO  . Fracture of lumbar spine ([place])   . Ventricular fibrillation ([place])     VF arrest/VT/torsades 06/2012 with prolonged hosp with VDRF, cardiogen shock, asp PNA, encephalopathy, anemia, shock liver, hypernatremia - s/p ICD implantation 06/08/12.  . Superficial venous thrombosis of upper extremity     06/2012 - LUE  . Severe aortic stenosis     s/p homograft aortic root replacement 2008  . Mitral  regurgitation     s/p mitral valve repair 2008  . Thoracic aortic aneurysm [facility])     s/p resection and grafting 2008  . Stroke ([place])   . Kidney stones   . Dysrhythmia   . AICD (automatic cardioverter/defibrillator) present     2014    Past Surgical History  Procedure Laterality Date  . Foot surgery Left     removed neuroma  . Aortic root replacement  2008    homograft  . Mitral valve replacement  2008    due to severe MR  . Resection and grafting of ascending thoracic aortic    . Aneurysm and left sided maze  2008  . Coronary artery bypass graft  4/10    LIMA to LAD, SVG to OM  . Wide excision of vulva      CA INSITU  . Cardiac defibrillator placement  06/08/2012    [address] [place] [address]  implanted by Dr Rayann Heman following a cardiac arrest  . Right heart catheterization N/A 04/03/2012    Procedure: RIGHT HEART CATH;  Surgeon: [name], MD;  Location: [facility] CATH LAB;  Service: Cardiovascular;  Laterality: N/A;  . Implantable cardioverter defibrillator implant N/A 06/08/2012    Procedure: IMPLANTABLE CARDIOVERTER DEFIBRILLATOR IMPLANT;  Surgeon: Thompson Grayer, MD;  Location: [facility] CATH LAB;  Service:  Cardiovascular;  Laterality: N/A;  . Cataract extraction    . Eye surgery    . Kyphoplasty N/A 12/11/2014    Procedure: KYPHOPLASTY T-12;  Surgeon: Karie Chimera, MD;  Location: [place] NEURO ORS;  Service: Neurosurgery;  Laterality: N/A;  KYPHOPLASTY T-12    There were no vitals filed for this visit.  Visit Diagnosis:  Osteoporosis  Bilateral low back pain without sciatica  Compression fracture of spine, non-traumatic, initial encounter (HCC)  Posture abnormality  Weakness      Subjective Assessment - 04/14/15 1126    Subjective Using walker only in the community.     Pertinent History Osteoporosis, Pacemaker/defibrillator, Kyphoplasty (12/2014)   Currently in Pain? Yes   Pain Score 2    Pain Location Back   Pain Orientation Right;Left;Lower   Pain Descriptors /  Indicators Aching;Throbbing   Pain Type Chronic pain   Pain Onset More than a month ago   Pain Frequency Constant   Aggravating Factors  standing and walking, later in the day   Pain Relieving Factors heat, sitting                         [facility] Adult PT Treatment/Exercise - 04/14/15 0001    Knee/Hip Exercises: Aerobic   Nustep Level 1 x 8 minutes  seat 8, arms 9   Knee/Hip Exercises: Standing   Heel Raises Both;20 reps   Hip Abduction Stengthening;2 sets;10 reps   Abduction Limitations 2# added   Hip Extension Stengthening;Both;2 sets;10 reps   Extension Limitations 2# added   Knee/Hip Exercises: Seated   Long Arc Quad Both;2 sets;10 reps;Strengthening;Weights   Long Arc Quad Weight 2 lbs.   Marching Strengthening;Both;2 sets;10 reps;Weights   Marching Weights 2 lbs.   Shoulder Exercises: Seated   Flexion Strengthening;Both;20 reps;Weights   Flexion Weight (lbs) 1   Shoulder Exercises: ROM/Strengthening   UBE (Upper Arm Bike) Level 1 x 6 minutes (3/3)                  PT Short Term Goals - 04/14/15 1116    PT SHORT TERM GOAL #1   Title be independent in initial HEP   Status Achieved   PT SHORT TERM GOAL #2   Title stand for 10 minutes to prepare an easy meal without need to sit   Time 4   Period Weeks   Status Achieved   PT SHORT TERM GOAL #3   Title ambulate into and out of the clinic with or without device at least 50% of the time   Status Achieved   PT SHORT TERM GOAL #4   Title report a 25% reduction in LBP with standing for ADLs and self-care   Time 4   Period Weeks   Status Achieved  worst with standing at home, 1-2/10 in the clinic           PT Long Term Goals - 04/01/15 1000    PT LONG TERM GOAL #1   Title be independent in advanced HEP   Time 8   Period Weeks   Status New   PT LONG TERM GOAL #2   Title reduce FOTO to < or = to 51% limitation   Time 8   Period Weeks   Status New   PT LONG TERM GOAL #3   Title stand  at home for cooking and ADLs for 15 minutes without need to rest   Time 8   Period Weeks  Status New   PT LONG TERM GOAL #4   Title increase LE strength to ambulate in the community for > or = to 5-10 minutes without significant fatigue   Time 8   Period Weeks   Status New   PT LONG TERM GOAL #5   Title report a 50% reduction in LBP with standing for ADLs and self-care   Time 8   Period Weeks   Status New               Plan - 04/14/15 1123    Clinical Impression Statement Pt is using walker for community distances only.  Pt tolerated the addition of weights with standing exercises today and requires fewer verbal cues for posture.  Pt is able to stand at home for ~10 minutes and reports LBP and fatigue although this has reduced since the start of care.  Pt with signfiicant thoracic kyphosis, rounded shoulders and weak abdominals.  Pt will continue to benefit from skilled PT for strength, endurance, gait, and posture education.   Pt will benefit from skilled therapeutic intervention in order to improve on the following deficits Pain;Postural dysfunction;Decreased strength;Decreased mobility;Impaired flexibility;Improper body mechanics;Decreased activity tolerance;Decreased endurance;Difficulty walking   Rehab Potential Good   PT Frequency 2x / week   PT Duration 8 weeks   PT Treatment/Interventions ADLs/Self Care Home Management;Cryotherapy;Moist Heat;Ultrasound;Gait training;Stair training;Functional mobility training;Therapeutic activities;Therapeutic exercise;Manual techniques;Patient/family education;Neuromuscular re-education;Passive range of motion   PT Next Visit Plan UE/LE strength and endurance, standing tolerance, postural strength, pain management as needed.   Try step-ups next session   Consulted and Agree with Plan of Care Patient        Problem List Patient Active Problem List   Diagnosis Date Noted  . ICD (implantable cardioverter-defibrillator) in place  11/28/2012  . Kidney disease, chronic, stage III (GFR 30-59 ml/min) 10/19/2012  . 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 Back pain, thoracic 02/21/2012  . Ischemic cardiomyopathy 01/26/2012  . Carcinoma in situ of vulva   . Fibroid   . Ovarian cyst, left   . Osteoporosis, idiopathic   . 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 GERD (gastroesophageal reflux disease)   . Encounter for long-term (current) use of anticoagulants 06/08/2010  . Mitral valve disease 04/16/2010  . VITAMIN D DEFICIENCY 11/05/2008  . VITAMIN B12 DEFICIENCY 10/22/2008  . Essential hypertension, benign 07/30/2008  . Celiac disease 08/09/2007  . HYPERLIPIDEMIA 07/21/2007  . Atrial fibrillation ([place]) 07/21/2007  . CHOLELITHIASIS 07/21/2007  . UNSPECIFIED IRON DEFICIENCY ANEMIA 06/06/2007    TAKACS,KELLY, PT 04/14/2015, 11:40 AM  [facility] [address] [place], [place], [address] Phone: [phone]   Fax:  [phone]  Name: ASIMINA CROCK MRN: [id number] Date of Birth: [birth year]

## 2015-04-16 ENCOUNTER — Ambulatory Visit: Payer: Medicare Other

## 2015-04-16 DIAGNOSIS — R293 Abnormal posture: Secondary | ICD-10-CM | POA: Diagnosis not present

## 2015-04-16 DIAGNOSIS — M4850XA Collapsed vertebra, not elsewhere classified, site unspecified, initial encounter for fracture: Secondary | ICD-10-CM | POA: Diagnosis not present

## 2015-04-16 DIAGNOSIS — M545 Low back pain, unspecified: Secondary | ICD-10-CM

## 2015-04-16 DIAGNOSIS — R531 Weakness: Secondary | ICD-10-CM

## 2015-04-16 DIAGNOSIS — M810 Age-related osteoporosis without current pathological fracture: Secondary | ICD-10-CM | POA: Diagnosis not present

## 2015-04-16 NOTE — Therapy (Signed)
[facility] [address] [place], [place], [address] Phone: [phone]   Fax:  [phone]  Physical Therapy Treatment  Patient Details  Name: [name] MRN: [id number] Date of Birth: Mar 09, 1940 Referring Provider: [name], MD  Encounter Date: 04/16/2015      PT End of Session - 04/16/15 1133    Visit Number 6   Number of Visits 10   Date for PT Re-Evaluation 05/27/15   PT Start Time [id number]   PT Stop Time 1139   PT Time Calculation (min) 48 min   Equipment Utilized During Treatment Back brace   Activity Tolerance Patient tolerated treatment well   Behavior During Therapy [facility] for tasks assessed/performed      Past Medical History  Diagnosis Date  . Anemia   . Atrial fibrillation [facility])     A.fib/flutter: s/p MAZE 2008, DCCV 2011, on coumadin; previously on flecainide, but dc'd due to worsening EF. Tikosyn discontinued 06/2012 after cardiac arrest.  . Diverticulitis   . CVA (cerebral vascular accident) ([place])     2008 - felt due to oscillating calcium on aortic valve  . Carcinoma in situ of vulva   . Fibroid   . Ovarian cyst, left 2008-2009  . Osteoporosis   . Diabetes mellitus   . Hypertension   . GERD (gastroesophageal reflux disease)   . Chronic systolic CHF (congestive heart failure) (HCC)     EF 35%  . Ischemic cardiomyopathy     EF 35%  . Coronary artery disease     Occluded LM 2/2 previous aortic root surgery; s/p 2v CABG 2010 LIMA to LAD, SVG to OM; Cath 03/11/12 patent SVG to OM, atretic LIMA to LAD, patent RCA, occluded native LM, EF 35%   . Arthritis     OSTEO  . Fracture of lumbar spine ([place])   . Ventricular fibrillation ([place])     VF arrest/VT/torsades 06/2012 with prolonged hosp with VDRF, cardiogen shock, asp PNA, encephalopathy, anemia, shock liver, hypernatremia - s/p ICD implantation 06/08/12.  . Superficial venous thrombosis of upper extremity     06/2012 - LUE  . Severe aortic  stenosis     s/p homograft aortic root replacement 2008  . Mitral regurgitation     s/p mitral valve repair 2008  . Thoracic aortic aneurysm [facility])     s/p resection and grafting 2008  . Stroke ([place])   . Kidney stones   . Dysrhythmia   . AICD (automatic cardioverter/defibrillator) present     2014    Past Surgical History  Procedure Laterality Date  . Foot surgery Left     removed neuroma  . Aortic root replacement  2008    homograft  . Mitral valve replacement  2008    due to severe MR  . Resection and grafting of ascending thoracic aortic    . Aneurysm and left sided maze  2008  . Coronary artery bypass graft  4/10    LIMA to LAD, SVG to OM  . Wide excision of vulva      CA INSITU  . Cardiac defibrillator placement  06/08/2012    [place] DR  implanted by Dr Rayann Heman following a cardiac arrest  . Right heart catheterization N/A 04/03/2012    Procedure: RIGHT HEART CATH;  Surgeon: [name], MD;  Location: [facility] CATH LAB;  Service: Cardiovascular;  Laterality: N/A;  . Implantable cardioverter defibrillator implant N/A 06/08/2012    Procedure: IMPLANTABLE CARDIOVERTER  DEFIBRILLATOR IMPLANT;  Surgeon: Thompson Grayer, MD;  Location: [facility] CATH LAB;  Service: Cardiovascular;  Laterality: N/A;  . Cataract extraction    . Eye surgery    . Kyphoplasty N/A 12/11/2014    Procedure: KYPHOPLASTY T-12;  Surgeon: Karie Chimera, MD;  Location: [place] NEURO ORS;  Service: Neurosurgery;  Laterality: N/A;  KYPHOPLASTY T-12    There were no vitals filed for this visit.  Visit Diagnosis:  Osteoporosis  Bilateral low back pain without sciatica  Compression fracture of spine, non-traumatic, initial encounter ([place])  Posture abnormality  Weakness      Subjective Assessment - 04/16/15 1051    Subjective Used walker to go get her blood work on Monday.  walked in independently while her husband parked the car.   Pertinent History Osteoporosis, Pacemaker/defibrillator, Kyphoplasty  (12/2014)   Currently in Pain? No/denies                         [facility] Adult PT Treatment/Exercise - 04/16/15 0001    Knee/Hip Exercises: Aerobic   Nustep Level 3 x 8 minutes  seat 8, arms 9   Knee/Hip Exercises: Standing   Heel Raises Both;20 reps   Hip Abduction Stengthening;2 sets;10 reps   Abduction Limitations 2# added   Hip Extension Stengthening;Both;2 sets;10 reps   Extension Limitations 2# added   Forward Step Up Both;2 sets;15 reps;Hand Hold: 2;Step Height: 6"   Knee/Hip Exercises: Seated   Long Arc Quad Both;2 sets;10 reps;Strengthening;Weights   Long Arc Quad Weight 2 lbs.   Marching Strengthening;Both;2 sets;10 reps;Weights   Marching Weights 2 lbs.   Shoulder Exercises: Seated   Flexion Strengthening;Both;20 reps;Weights   Flexion Weight (lbs) 1   Shoulder Exercises: ROM/Strengthening   UBE (Upper Arm Bike) Level 1 x 6 minutes (3/3)                  PT Short Term Goals - 04/14/15 1116    PT SHORT TERM GOAL #1   Title be independent in initial HEP   Status Achieved   PT SHORT TERM GOAL #2   Title stand for 10 minutes to prepare an easy meal without need to sit   Time 4   Period Weeks   Status Achieved   PT SHORT TERM GOAL #3   Title ambulate into and out of the clinic with or without device at least 50% of the time   Status Achieved   PT SHORT TERM GOAL #4   Title report a 25% reduction in LBP with standing for ADLs and self-care   Time 4   Period Weeks   Status Achieved  worst with standing at home, 1-2/10 in the clinic           PT Long Term Goals - 04/01/15 1000    PT LONG TERM GOAL #1   Title be independent in advanced HEP   Time 8   Period Weeks   Status New   PT LONG TERM GOAL #2   Title reduce FOTO to < or = to 51% limitation   Time 8   Period Weeks   Status New   PT LONG TERM GOAL #3   Title stand at home for cooking and ADLs for 15 minutes without need to rest   Time 8   Period Weeks   Status New   PT  LONG TERM GOAL #4   Title increase LE strength to ambulate in the community for > or =  to 5-10 minutes without significant fatigue   Time 8   Period Weeks   Status New   PT LONG TERM GOAL #5   Title report a 50% reduction in LBP with standing for ADLs and self-care   Time 8   Period Weeks   Status New               Plan - 04/16/15 1100    Clinical Impression Statement Pt is using walker for safety in the community and no device at home. Pt with improved postural awareness with fewer verbal cues needed.  Pt is able to stand at home for ~10 minutes and reports only mild LBP with this activity. Pt tolerated step-ups and advancement of strength exercises without difficulty.  Pt will benefit from skilled PT for strength, posture and endurance training.     Pt will benefit from skilled therapeutic intervention in order to improve on the following deficits Pain;Postural dysfunction;Decreased strength;Decreased mobility;Impaired flexibility;Improper body mechanics;Decreased activity tolerance;Decreased endurance;Difficulty walking   Rehab Potential Good   PT Frequency 2x / week   PT Duration 8 weeks   PT Treatment/Interventions ADLs/Self Care Home Management;Cryotherapy;Moist Heat;Ultrasound;Gait training;Stair training;Functional mobility training;Therapeutic activities;Therapeutic exercise;Manual techniques;Patient/family education;Neuromuscular re-education;Passive range of motion   PT Next Visit Plan UE/LE strength and endurance, standing tolerance, postural strength, pain management as needed.      Consulted and Agree with Plan of Care Patient        Problem List Patient Active Problem List   Diagnosis Date Noted  . ICD (implantable cardioverter-defibrillator) in place 11/28/2012  . Kidney disease, chronic, stage III (GFR 30-59 ml/min) 10/19/2012  . 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 Back pain, thoracic 02/21/2012  . Ischemic  cardiomyopathy 01/26/2012  . Carcinoma in situ of vulva   . Fibroid   . Ovarian cyst, left   . Osteoporosis, idiopathic   . 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 GERD (gastroesophageal reflux disease)   . Encounter for long-term (current) use of anticoagulants 06/08/2010  . Mitral valve disease 04/16/2010  . VITAMIN D DEFICIENCY 11/05/2008  . VITAMIN B12 DEFICIENCY 10/22/2008  . Essential hypertension, benign 07/30/2008  . Celiac disease 08/09/2007  . HYPERLIPIDEMIA 07/21/2007  . Atrial fibrillation ([place]) 07/21/2007  . CHOLELITHIASIS 07/21/2007  . UNSPECIFIED IRON DEFICIENCY ANEMIA 06/06/2007    Valeria Krisko, PT 04/16/2015, 11:33 AM  [facility] [address] [place], [place], [address] Phone: [phone]   Fax:  [phone]  Name: HENRYETTA GUASTELLA MRN: [id number] Date of Birth: 29-May-1940

## 2015-04-21 ENCOUNTER — Ambulatory Visit: Payer: Medicare Other

## 2015-04-21 DIAGNOSIS — M545 Low back pain, unspecified: Secondary | ICD-10-CM

## 2015-04-21 DIAGNOSIS — M4850XA Collapsed vertebra, not elsewhere classified, site unspecified, initial encounter for fracture: Secondary | ICD-10-CM

## 2015-04-21 DIAGNOSIS — R293 Abnormal posture: Secondary | ICD-10-CM

## 2015-04-21 DIAGNOSIS — R531 Weakness: Secondary | ICD-10-CM

## 2015-04-21 DIAGNOSIS — M810 Age-related osteoporosis without current pathological fracture: Secondary | ICD-10-CM

## 2015-04-21 NOTE — Therapy (Signed)
[facility] [address], [place], [place], [address] Phone: [phone]   Fax:  [phone]  Physical Therapy Treatment  Patient Details  Name: [name] MRN: [id number] Date of Birth: 1940-08-30 Referring Provider: [name], MD  Encounter Date: 04/21/2015      PT End of Session - 04/21/15 1144    Visit Number 7   Number of Visits 10   Date for PT Re-Evaluation 05/27/15   PT Start Time 1110   PT Stop Time 1151   PT Time Calculation (min) 41 min   Activity Tolerance Patient tolerated treatment well   Behavior During Therapy [facility] for tasks assessed/performed      Past Medical History  Diagnosis Date  . Anemia   . Atrial fibrillation [facility])     A.fib/flutter: s/p MAZE 2008, DCCV 2011, on coumadin; previously on flecainide, but dc'd due to worsening EF. Tikosyn discontinued 06/2012 after cardiac arrest.  . Diverticulitis   . CVA (cerebral vascular accident) ([place])     2008 - felt due to oscillating calcium on aortic valve  . Carcinoma in situ of vulva   . Fibroid   . Ovarian cyst, left 2008-2009  . Osteoporosis   . Diabetes mellitus   . Hypertension   . GERD (gastroesophageal reflux disease)   . Chronic systolic CHF (congestive heart failure) (HCC)     EF 35%  . Ischemic cardiomyopathy     EF 35%  . Coronary artery disease     Occluded LM 2/2 previous aortic root surgery; s/p 2v CABG 2010 LIMA to LAD, SVG to OM; Cath 03/11/12 patent SVG to OM, atretic LIMA to LAD, patent RCA, occluded native LM, EF 35%   . Arthritis     OSTEO  . Fracture of lumbar spine ([place])   . Ventricular fibrillation ([place])     VF arrest/VT/torsades 06/2012 with prolonged hosp with VDRF, cardiogen shock, asp PNA, encephalopathy, anemia, shock liver, hypernatremia - s/p ICD implantation 06/08/12.  . Superficial venous thrombosis of upper extremity     06/2012 - LUE  . Severe aortic stenosis     s/p homograft aortic root replacement  2008  . Mitral regurgitation     s/p mitral valve repair 2008  . Thoracic aortic aneurysm [facility])     s/p resection and grafting 2008  . Stroke ([place])   . Kidney stones   . Dysrhythmia   . AICD (automatic cardioverter/defibrillator) present     2014    Past Surgical History  Procedure Laterality Date  . Foot surgery Left     removed neuroma  . Aortic root replacement  2008    homograft  . Mitral valve replacement  2008    due to severe MR  . Resection and grafting of ascending thoracic aortic    . Aneurysm and left sided maze  2008  . Coronary artery bypass graft  4/10    LIMA to LAD, SVG to OM  . Wide excision of vulva      CA INSITU  . Cardiac defibrillator placement  06/08/2012    [facility] [place] DR  implanted by Dr Rayann Heman following a cardiac arrest  . Right heart catheterization N/A 04/03/2012    Procedure: RIGHT HEART CATH;  Surgeon: [name], MD;  Location: [facility] CATH LAB;  Service: Cardiovascular;  Laterality: N/A;  . Implantable cardioverter defibrillator implant N/A 06/08/2012    Procedure: IMPLANTABLE CARDIOVERTER DEFIBRILLATOR IMPLANT;  Surgeon: Thompson Grayer, MD;  Location: [place] CATH LAB;  Service: Cardiovascular;  Laterality: N/A;  . Cataract extraction    . Eye surgery    . Kyphoplasty N/A 12/11/2014    Procedure: KYPHOPLASTY T-12;  Surgeon: Karie Chimera, MD;  Location: [place] NEURO ORS;  Service: Neurosurgery;  Laterality: N/A;  KYPHOPLASTY T-12    There were no vitals filed for this visit.  Visit Diagnosis:  Osteoporosis  Bilateral low back pain without sciatica  Compression fracture of spine, non-traumatic, initial encounter ([place])  Posture abnormality  Weakness      Subjective Assessment - 04/21/15 1115    Subjective Pt used walker to get blood work last week.  Pt is using walker only in the community.     Currently in Pain? Yes   Pain Score 2   7/10 with cooking/housework   Pain Location Back   Pain Orientation Right;Left;Lower   Pain  Type Chronic pain   Pain Onset More than a month ago   Pain Frequency Constant   Aggravating Factors  standing and walking, later in the day   Pain Relieving Factors heat, sitting                         [facility] Adult PT Treatment/Exercise - 04/21/15 0001    Knee/Hip Exercises: Aerobic   Nustep Level 3 x 8 minutes  seat 8, arms 9   Knee/Hip Exercises: Standing   Heel Raises Both;20 reps   Hip Abduction Stengthening;2 sets;10 reps   Abduction Limitations 2.5# added   Hip Extension Stengthening;Both;2 sets;10 reps   Extension Limitations 2.5#    Forward Step Up Both;2 sets;15 reps;Hand Hold: 2;Step Height: 6"   Knee/Hip Exercises: Seated   Long Arc Quad Both;2 sets;10 reps;Strengthening;Weights   Long Arc Quad Weight --  2.5#   Shoulder Exercises: ROM/Strengthening   UBE (Upper Arm Bike) Level 1 x 6 minutes (3/3)                  PT Short Term Goals - 04/14/15 1116    PT SHORT TERM GOAL #1   Title be independent in initial HEP   Status Achieved   PT SHORT TERM GOAL #2   Title stand for 10 minutes to prepare an easy meal without need to sit   Time 4   Period Weeks   Status Achieved   PT SHORT TERM GOAL #3   Title ambulate into and out of the clinic with or without device at least 50% of the time   Status Achieved   PT SHORT TERM GOAL #4   Title report a 25% reduction in LBP with standing for ADLs and self-care   Time 4   Period Weeks   Status Achieved  worst with standing at home, 1-2/10 in the clinic           PT Long Term Goals - 04/21/15 1114    PT LONG TERM GOAL #1   Title be independent in advanced HEP   Time 8   Period Weeks   Status On-going  moderate compliance   PT LONG TERM GOAL #3   Title stand at home for cooking and ADLs for 15 minutes without need to rest   Time 8   Period Weeks   Status On-going  inconsistent pain with standing   PT LONG TERM GOAL #4   Title increase LE strength to ambulate in the community for > or  = to 5-10 minutes without significant fatigue  Time 8   Period Weeks   Status On-going  hasnt tested this   PT LONG TERM GOAL #5   Title report a 50% reduction in LBP with standing for ADLs and self-care   Time 8   Period Weeks   Status On-going               Plan - 04/21/15 1117    Clinical Impression Statement Pt continues to use walker in the community for safety and no assistive device at home.  Pt reports up to 7/10 LBP with standing to cook a meal > 10 minutes.  Pt is able to stand up to 20-30 minutes but has increased pain with this activity.  Pt tolerated step up activity and increased ankle weights with LE strength exercises.  Pt will continue to benefit from skilled PT for strength, posture and endurance training.     Pt will benefit from skilled therapeutic intervention in order to improve on the following deficits Pain;Postural dysfunction;Decreased strength;Decreased mobility;Impaired flexibility;Improper body mechanics;Decreased activity tolerance;Decreased endurance;Difficulty walking   Rehab Potential Good   PT Frequency 2x / week   PT Duration 8 weeks   PT Treatment/Interventions ADLs/Self Care Home Management;Cryotherapy;Moist Heat;Ultrasound;Gait training;Stair training;Functional mobility training;Therapeutic activities;Therapeutic exercise;Manual techniques;Patient/family education;Neuromuscular re-education;Passive range of motion   PT Next Visit Plan UE/LE strength and endurance, standing tolerance, postural strength, pain management as needed.      Consulted and Agree with Plan of Care Patient        Problem List Patient Active Problem List   Diagnosis Date Noted  . ICD (implantable cardioverter-defibrillator) in place 11/28/2012  . Kidney disease, chronic, stage III (GFR 30-59 ml/min) 10/19/2012  . 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 Back pain, thoracic 02/21/2012  . Ischemic cardiomyopathy  01/26/2012  . Carcinoma in situ of vulva   . Fibroid   . Ovarian cyst, left   . Osteoporosis, idiopathic   . 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 GERD (gastroesophageal reflux disease)   . Encounter for long-term (current) use of anticoagulants 06/08/2010  . Mitral valve disease 04/16/2010  . VITAMIN D DEFICIENCY 11/05/2008  . VITAMIN B12 DEFICIENCY 10/22/2008  . Essential hypertension, benign 07/30/2008  . Celiac disease 08/09/2007  . HYPERLIPIDEMIA 07/21/2007  . Atrial fibrillation ([place]) 07/21/2007  . CHOLELITHIASIS 07/21/2007  . UNSPECIFIED IRON DEFICIENCY ANEMIA 06/06/2007    TAKACS,KELLY, PT 04/21/2015, 11:45 AM  [facility] [address] [place], [place], [address] Phone: [phone]   Fax:  [phone]  Name: [name] MRN: [id number] Date of Birth: 09/28/1940

## 2015-04-24 ENCOUNTER — Ambulatory Visit: Payer: Medicare Other

## 2015-04-24 DIAGNOSIS — R293 Abnormal posture: Secondary | ICD-10-CM | POA: Diagnosis not present

## 2015-04-24 DIAGNOSIS — M545 Low back pain, unspecified: Secondary | ICD-10-CM

## 2015-04-24 DIAGNOSIS — M810 Age-related osteoporosis without current pathological fracture: Secondary | ICD-10-CM | POA: Diagnosis not present

## 2015-04-24 DIAGNOSIS — R531 Weakness: Secondary | ICD-10-CM | POA: Diagnosis not present

## 2015-04-24 DIAGNOSIS — M4850XA Collapsed vertebra, not elsewhere classified, site unspecified, initial encounter for fracture: Secondary | ICD-10-CM

## 2015-04-24 NOTE — Therapy (Signed)
[facility] [address], [place], [place], [address] Phone: [phone]   Fax:  [phone]  Physical Therapy Treatment  Patient Details  Name: [name] MRN: [id number] Date of Birth: February 09, 1941 Referring Provider: [name], MD  Encounter Date: 04/24/2015      PT End of Session - 04/24/15 1140    Visit Number 8   Number of Visits 10   Date for PT Re-Evaluation 05/27/15   Authorization Type KX at visit 15   PT Start Time 1056   PT Stop Time 1144   PT Time Calculation (min) 48 min   Equipment Utilized During Treatment Back brace   Activity Tolerance Patient tolerated treatment well   Behavior During Therapy [facility] for tasks assessed/performed      Past Medical History  Diagnosis Date  . Anemia   . Atrial fibrillation [facility])     A.fib/flutter: s/p MAZE 2008, DCCV 2011, on coumadin; previously on flecainide, but dc'd due to worsening EF. Tikosyn discontinued 06/2012 after cardiac arrest.  . Diverticulitis   . CVA (cerebral vascular accident) ([place])     2008 - felt due to oscillating calcium on aortic valve  . Carcinoma in situ of vulva   . Fibroid   . Ovarian cyst, left 2008-2009  . Osteoporosis   . Diabetes mellitus   . Hypertension   . GERD (gastroesophageal reflux disease)   . Chronic systolic CHF (congestive heart failure) (HCC)     EF 35%  . Ischemic cardiomyopathy     EF 35%  . Coronary artery disease     Occluded LM 2/2 previous aortic root surgery; s/p 2v CABG 2010 LIMA to LAD, SVG to OM; Cath 03/11/12 patent SVG to OM, atretic LIMA to LAD, patent RCA, occluded native LM, EF 35%   . Arthritis     OSTEO  . Fracture of lumbar spine ([place])   . Ventricular fibrillation ([place])     VF arrest/VT/torsades 06/2012 with prolonged hosp with VDRF, cardiogen shock, asp PNA, encephalopathy, anemia, shock liver, hypernatremia - s/p ICD implantation 06/08/12.  . Superficial venous thrombosis of upper extremity      06/2012 - LUE  . Severe aortic stenosis     s/p homograft aortic root replacement 2008  . Mitral regurgitation     s/p mitral valve repair 2008  . Thoracic aortic aneurysm [facility]     s/p resection and grafting 2008  . Stroke ([place])   . Kidney stones   . Dysrhythmia   . AICD (automatic cardioverter/defibrillator) present     2014    Past Surgical History  Procedure Laterality Date  . Foot surgery Left     removed neuroma  . Aortic root replacement  2008    homograft  . Mitral valve replacement  2008    due to severe MR  . Resection and grafting of ascending thoracic aortic    . Aneurysm and left sided maze  2008  . Coronary artery bypass graft  4/10    LIMA to LAD, SVG to OM  . Wide excision of vulva      CA INSITU  . Cardiac defibrillator placement  06/08/2012    [address] [place] [address]  implanted by Dr Rayann Heman following a cardiac arrest  . Right heart catheterization N/A 04/03/2012    Procedure: RIGHT HEART CATH;  Surgeon: [name], MD;  Location: [facility] CATH LAB;  Service: Cardiovascular;  Laterality: N/A;  . Implantable cardioverter defibrillator implant  N/A 06/08/2012    Procedure: IMPLANTABLE CARDIOVERTER DEFIBRILLATOR IMPLANT;  Surgeon: Thompson Grayer, MD;  Location: [facility] CATH LAB;  Service: Cardiovascular;  Laterality: N/A;  . Cataract extraction    . Eye surgery    . Kyphoplasty N/A 12/11/2014    Procedure: KYPHOPLASTY T-12;  Surgeon: Karie Chimera, MD;  Location: [place] NEURO ORS;  Service: Neurosurgery;  Laterality: N/A;  KYPHOPLASTY T-12    There were no vitals filed for this visit.  Visit Diagnosis:  Osteoporosis  Bilateral low back pain without sciatica  Compression fracture of spine, non-traumatic, initial encounter (HCC)  Posture abnormality  Weakness                       [facility] Adult PT Treatment/Exercise - 04/24/15 0001    Knee/Hip Exercises: Aerobic   Nustep Level 3 x 8 minutes  seat 8, arms 9   Knee/Hip Exercises: Standing    Heel Raises Both;20 reps   Hip Abduction Stengthening;2 sets;10 reps   Abduction Limitations 2.5# added   Hip Extension Stengthening;Both;2 sets;10 reps   Extension Limitations 2.5#    Forward Step Up Both;2 sets;15 reps;Hand Hold: 2;Step Height: 6"   Knee/Hip Exercises: Seated   Long Arc Quad Both;2 sets;10 reps;Strengthening;Weights   Long Arc Quad Weight --  2.5#   Shoulder Exercises: Seated   Flexion Strengthening;Both;20 reps;Weights   Flexion Weight (lbs) 1   Shoulder Exercises: ROM/Strengthening   UBE (Upper Arm Bike) Level 1 x 6 minutes (3/3)                  PT Short Term Goals - 04/14/15 1116    PT SHORT TERM GOAL #1   Title be independent in initial HEP   Status Achieved   PT SHORT TERM GOAL #2   Title stand for 10 minutes to prepare an easy meal without need to sit   Time 4   Period Weeks   Status Achieved   PT SHORT TERM GOAL #3   Title ambulate into and out of the clinic with or without device at least 50% of the time   Status Achieved   PT SHORT TERM GOAL #4   Title report a 25% reduction in LBP with standing for ADLs and self-care   Time 4   Period Weeks   Status Achieved  worst with standing at home, 1-2/10 in the clinic           PT Long Term Goals - 04/21/15 1114    PT LONG TERM GOAL #1   Title be independent in advanced HEP   Time 8   Period Weeks   Status On-going  moderate compliance   PT LONG TERM GOAL #3   Title stand at home for cooking and ADLs for 15 minutes without need to rest   Time 8   Period Weeks   Status On-going  inconsistent pain with standing   PT LONG TERM GOAL #4   Title increase LE strength to ambulate in the community for > or = to 5-10 minutes without significant fatigue   Time 8   Period Weeks   Status On-going  hasnt tested this   PT LONG TERM GOAL #5   Title report a 50% reduction in LBP with standing for ADLs and self-care   Time 8   Period Weeks   Status On-going               Plan -  04/24/15 1111  Clinical Impression Statement Pt with mild LBP during treatment session in standing . Pt requires UE support with standing exercise for stability and to reduce LBP.  Pt is able to stand for 20-30 minutes but has increased pain with this activity.  Pt tolertated step-up activity and increased ankle weights in the clinic this week.  Pt will continue to benefit from skilled PT for strength, posture and endurance training.   Pt will benefit from skilled therapeutic intervention in order to improve on the following deficits Pain;Postural dysfunction;Decreased strength;Decreased mobility;Impaired flexibility;Improper body mechanics;Decreased activity tolerance;Decreased endurance;Difficulty walking   Rehab Potential Good   PT Frequency 2x / week   PT Duration 8 weeks   PT Treatment/Interventions ADLs/Self Care Home Management;Cryotherapy;Moist Heat;Ultrasound;Gait training;Stair training;Functional mobility training;Therapeutic activities;Therapeutic exercise;Manual techniques;Patient/family education;Neuromuscular re-education;Passive range of motion   PT Next Visit Plan UE/LE strength and endurance, standing tolerance, postural strength, pain management as needed.      Consulted and Agree with Plan of Care Patient        Problem List Patient Active Problem List   Diagnosis Date Noted  . ICD (implantable cardioverter-defibrillator) in place 11/28/2012  . Kidney disease, chronic, stage III (GFR 30-59 ml/min) 10/19/2012  . 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 Back pain, thoracic 02/21/2012  . Ischemic cardiomyopathy 01/26/2012  . Carcinoma in situ of vulva   . Fibroid   . Ovarian cyst, left   . Osteoporosis, idiopathic   . 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 GERD (gastroesophageal reflux disease)   . Encounter for long-term (current) use of anticoagulants 06/08/2010   . Mitral valve disease 04/16/2010  . VITAMIN D DEFICIENCY 11/05/2008  . VITAMIN B12 DEFICIENCY 10/22/2008  . Essential hypertension, benign 07/30/2008  . Celiac disease 08/09/2007  . HYPERLIPIDEMIA 07/21/2007  . Atrial fibrillation ([place]) 07/21/2007  . CHOLELITHIASIS 07/21/2007  . UNSPECIFIED IRON DEFICIENCY ANEMIA 06/06/2007    TAKACS,KELLY, PT 04/24/2015, 11:41 AM  [facility] [address] [place], [place], [address] Phone: [phone]   Fax:  [phone]  Name: WYLODEAN LOZEAU MRN: [id number] Date of Birth: 1940/05/21

## 2015-04-28 ENCOUNTER — Ambulatory Visit: Payer: Medicare Other

## 2015-04-28 DIAGNOSIS — M545 Low back pain, unspecified: Secondary | ICD-10-CM

## 2015-04-28 DIAGNOSIS — M4850XA Collapsed vertebra, not elsewhere classified, site unspecified, initial encounter for fracture: Secondary | ICD-10-CM | POA: Diagnosis not present

## 2015-04-28 DIAGNOSIS — R531 Weakness: Secondary | ICD-10-CM

## 2015-04-28 DIAGNOSIS — R293 Abnormal posture: Secondary | ICD-10-CM

## 2015-04-28 DIAGNOSIS — M810 Age-related osteoporosis without current pathological fracture: Secondary | ICD-10-CM

## 2015-04-28 NOTE — Therapy (Signed)
[facility] [address], [place], [place], [address] Phone: [phone]   Fax:  [phone]  Physical Therapy Treatment  Patient Details  Name: [name] MRN: [id number] Date of Birth: [birth year] Referring Provider: [name], MD  Encounter Date: 04/28/2015      PT End of Session - 04/28/15 1303    Visit Number 9   Number of Visits 10   Date for PT Re-Evaluation 05/27/15   Authorization Type KX at visit 15   PT Start Time 1230   PT Stop Time 1313   PT Time Calculation (min) 43 min   Activity Tolerance Patient tolerated treatment well   Behavior During Therapy [facility] for tasks assessed/performed      Past Medical History  Diagnosis Date  . Anemia   . Atrial fibrillation [facility])     A.fib/flutter: s/p MAZE 2008, DCCV 2011, on coumadin; previously on flecainide, but dc'd due to worsening EF. Tikosyn discontinued 06/2012 after cardiac arrest.  . Diverticulitis   . CVA (cerebral vascular accident) ([place])     2008 - felt due to oscillating calcium on aortic valve  . Carcinoma in situ of vulva   . Fibroid   . Ovarian cyst, left 2008-2009  . Osteoporosis   . Diabetes mellitus   . Hypertension   . GERD (gastroesophageal reflux disease)   . Chronic systolic CHF (congestive heart failure) (HCC)     EF 35%  . Ischemic cardiomyopathy     EF 35%  . Coronary artery disease     Occluded LM 2/2 previous aortic root surgery; s/p 2v CABG 2010 LIMA to LAD, SVG to OM; Cath 03/11/12 patent SVG to OM, atretic LIMA to LAD, patent RCA, occluded native LM, EF 35%   . Arthritis     OSTEO  . Fracture of lumbar spine ([place])   . Ventricular fibrillation ([place])     VF arrest/VT/torsades 06/2012 with prolonged hosp with VDRF, cardiogen shock, asp PNA, encephalopathy, anemia, shock liver, hypernatremia - s/p ICD implantation 06/08/12.  . Superficial venous thrombosis of upper extremity     06/2012 - LUE  . Severe aortic stenosis     s/p  homograft aortic root replacement 2008  . Mitral regurgitation     s/p mitral valve repair 2008  . Thoracic aortic aneurysm [facility])     s/p resection and grafting 2008  . Stroke ([place])   . Kidney stones   . Dysrhythmia   . AICD (automatic cardioverter/defibrillator) present     2014    Past Surgical History  Procedure Laterality Date  . Foot surgery Left     removed neuroma  . Aortic root replacement  2008    homograft  . Mitral valve replacement  2008    due to severe MR  . Resection and grafting of ascending thoracic aortic    . Aneurysm and left sided maze  2008  . Coronary artery bypass graft  4/10    LIMA to LAD, SVG to OM  . Wide excision of vulva      CA INSITU  . Cardiac defibrillator placement  06/08/2012    [facility] [place] DR  implanted by Dr Rayann Heman following a cardiac arrest  . Right heart catheterization N/A 04/03/2012    Procedure: RIGHT HEART CATH;  Surgeon: [name], MD;  Location: [facility] CATH LAB;  Service: Cardiovascular;  Laterality: N/A;  . Implantable cardioverter defibrillator implant N/A 06/08/2012    Procedure: IMPLANTABLE CARDIOVERTER  DEFIBRILLATOR IMPLANT;  Surgeon: Thompson Grayer, MD;  Location: [facility] CATH LAB;  Service: Cardiovascular;  Laterality: N/A;  . Cataract extraction    . Eye surgery    . Kyphoplasty N/A 12/11/2014    Procedure: KYPHOPLASTY T-12;  Surgeon: Karie Chimera, MD;  Location: [place] NEURO ORS;  Service: Neurosurgery;  Laterality: N/A;  KYPHOPLASTY T-12    There were no vitals filed for this visit.  Visit Diagnosis:  Osteoporosis  Bilateral low back pain without sciatica  Compression fracture of spine, non-traumatic, initial encounter (HCC)  Posture abnormality  Weakness      Subjective Assessment - 04/28/15 1238    Subjective Doing well today.  Did exercise over the weekend.   Currently in Pain? Yes   Pain Score 1    Pain Location Back   Pain Orientation Right;Left;Lower   Pain Descriptors / Indicators  Aching;Throbbing   Pain Type Chronic pain   Pain Onset More than a month ago   Pain Frequency Constant   Aggravating Factors  standing and walking, later in the day   Pain Relieving Factors heat, sitting                         [facility] Adult PT Treatment/Exercise - 04/28/15 0001    Knee/Hip Exercises: Aerobic   Nustep Level 3 x 8 minutes  seat 8, arms 9   Knee/Hip Exercises: Standing   Heel Raises Both;20 reps   Hip Abduction Stengthening;2 sets;10 reps   Abduction Limitations 2.5# added   Hip Extension Stengthening;Both;2 sets;10 reps   Extension Limitations 2.5#    Forward Step Up Both;2 sets;15 reps;Hand Hold: 2;Step Height: 6"   Knee/Hip Exercises: Seated   Long Arc Quad Both;2 sets;10 reps;Strengthening;Weights   Long Arc Quad Weight --  2.5#   Shoulder Exercises: Seated   Flexion Strengthening;Both;Weights  3x10   Flexion Weight (lbs) 1   Shoulder Exercises: ROM/Strengthening   UBE (Upper Arm Bike) Level 1 x 6 minutes (3/3)                  PT Short Term Goals - 04/14/15 1116    PT SHORT TERM GOAL #1   Title be independent in initial HEP   Status Achieved   PT SHORT TERM GOAL #2   Title stand for 10 minutes to prepare an easy meal without need to sit   Time 4   Period Weeks   Status Achieved   PT SHORT TERM GOAL #3   Title ambulate into and out of the clinic with or without device at least 50% of the time   Status Achieved   PT SHORT TERM GOAL #4   Title report a 25% reduction in LBP with standing for ADLs and self-care   Time 4   Period Weeks   Status Achieved  worst with standing at home, 1-2/10 in the clinic           PT Long Term Goals - 04/28/15 1239    PT LONG TERM GOAL #1   Title be independent in advanced HEP   Time 8   Period Weeks   Status On-going   PT LONG TERM GOAL #2   Title reduce FOTO to < or = to 51% limitation   Time 8   Period Weeks   Status On-going   PT LONG TERM GOAL #3   Title stand at home for  cooking and ADLs for 15 minutes without need to rest  Time 8   Period Weeks   Status Achieved   PT LONG TERM GOAL #4   Title increase LE strength to ambulate in the community for > or = to 5-10 minutes without significant fatigue   Time 8   Period Weeks   Status On-going   PT LONG TERM GOAL #5   Title report a 50% reduction in LBP with standing for ADLs and self-care   Time 8   Period Weeks   Status On-going               Plan - 04/28/15 1241    Clinical Impression Statement Pt is able to stand 20 minutes at home to prepare a meal and reports that this is "pushing it".  Pt is independent with current HEP for UE/LE strength. Pt tolerated increased reps with arm weights well today.   Pt continues to use walker for UE support and balance for longer distances.  Pt will continue benefit from skilled PT for safe progression of strength, endurance and gait.     Pt will benefit from skilled therapeutic intervention in order to improve on the following deficits Pain;Postural dysfunction;Decreased strength;Decreased mobility;Impaired flexibility;Improper body mechanics;Decreased activity tolerance;Decreased endurance;Difficulty walking   Rehab Potential Good   PT Frequency 2x / week   PT Duration 8 weeks   PT Treatment/Interventions ADLs/Self Care Home Management;Cryotherapy;Moist Heat;Ultrasound;Gait training;Stair training;Functional mobility training;Therapeutic activities;Therapeutic exercise;Manual techniques;Patient/family education;Neuromuscular re-education;Passive range of motion   PT Next Visit Plan UE/LE strength and endurance, standing tolerance, postural strength, pain management as needed.   G-codes next session   Consulted and Agree with Plan of Care Patient        Problem List Patient Active Problem List   Diagnosis Date Noted  . ICD (implantable cardioverter-defibrillator) in place 11/28/2012  . Kidney disease, chronic, stage III (GFR 30-59 ml/min) 10/19/2012  .  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 Back pain, thoracic 02/21/2012  . Ischemic cardiomyopathy 01/26/2012  . Carcinoma in situ of vulva   . Fibroid   . Ovarian cyst, left   . Osteoporosis, idiopathic   . 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 GERD (gastroesophageal reflux disease)   . Encounter for long-term (current) use of anticoagulants 06/08/2010  . Mitral valve disease 04/16/2010  . VITAMIN D DEFICIENCY 11/05/2008  . VITAMIN B12 DEFICIENCY 10/22/2008  . Essential hypertension, benign 07/30/2008  . Celiac disease 08/09/2007  . HYPERLIPIDEMIA 07/21/2007  . Atrial fibrillation ([place]) 07/21/2007  . CHOLELITHIASIS 07/21/2007  . UNSPECIFIED IRON DEFICIENCY ANEMIA 06/06/2007    Ronisha Herringshaw, PT 04/28/2015, 1:05 PM  [facility] [address], [place], [place], [address] Phone: [phone]   Fax:  [phone]  Name: ATLAS DIGANGI MRN: [id number] Date of Birth: 17-Aug-1940

## 2015-04-29 DIAGNOSIS — E119 Type 2 diabetes mellitus without complications: Secondary | ICD-10-CM | POA: Diagnosis not present

## 2015-04-29 DIAGNOSIS — M810 Age-related osteoporosis without current pathological fracture: Secondary | ICD-10-CM | POA: Diagnosis not present

## 2015-04-29 DIAGNOSIS — E559 Vitamin D deficiency, unspecified: Secondary | ICD-10-CM | POA: Diagnosis not present

## 2015-05-01 ENCOUNTER — Ambulatory Visit: Payer: Medicare Other

## 2015-05-01 DIAGNOSIS — M4850XA Collapsed vertebra, not elsewhere classified, site unspecified, initial encounter for fracture: Secondary | ICD-10-CM

## 2015-05-01 DIAGNOSIS — M545 Low back pain, unspecified: Secondary | ICD-10-CM

## 2015-05-01 DIAGNOSIS — R293 Abnormal posture: Secondary | ICD-10-CM | POA: Diagnosis not present

## 2015-05-01 DIAGNOSIS — R531 Weakness: Secondary | ICD-10-CM

## 2015-05-01 DIAGNOSIS — M810 Age-related osteoporosis without current pathological fracture: Secondary | ICD-10-CM

## 2015-05-01 NOTE — Therapy (Signed)
[facility] [address] [place], [place], [address] Phone: [phone]   Fax:  [phone]  Physical Therapy Treatment  Patient Details  Name: [name] MRN: [id number] Date of Birth: 04-09-1940 Referring Provider: [name], MD  Encounter Date: 05/01/2015      PT End of Session - 05/01/15 1220    Visit Number 10   Number of Visits 20   Date for PT Re-Evaluation 05/27/15   Authorization Type KX at visit 15   PT Start Time 1137   PT Stop Time 1226   PT Time Calculation (min) 49 min   Activity Tolerance Patient tolerated treatment well   Behavior During Therapy [facility] for tasks assessed/performed      Past Medical History  Diagnosis Date  . Anemia   . Atrial fibrillation [facility])     A.fib/flutter: s/p MAZE 2008, DCCV 2011, on coumadin; previously on flecainide, but dc'd due to worsening EF. Tikosyn discontinued 06/2012 after cardiac arrest.  . Diverticulitis   . CVA (cerebral vascular accident) ([place])     2008 - felt due to oscillating calcium on aortic valve  . Carcinoma in situ of vulva   . Fibroid   . Ovarian cyst, left 2008-2009  . Osteoporosis   . Diabetes mellitus   . Hypertension   . GERD (gastroesophageal reflux disease)   . Chronic systolic CHF (congestive heart failure) (HCC)     EF 35%  . Ischemic cardiomyopathy     EF 35%  . Coronary artery disease     Occluded LM 2/2 previous aortic root surgery; s/p 2v CABG 2010 LIMA to LAD, SVG to OM; Cath 03/11/12 patent SVG to OM, atretic LIMA to LAD, patent RCA, occluded native LM, EF 35%   . Arthritis     OSTEO  . Fracture of lumbar spine ([place])   . Ventricular fibrillation ([place])     VF arrest/VT/torsades 06/2012 with prolonged hosp with VDRF, cardiogen shock, asp PNA, encephalopathy, anemia, shock liver, hypernatremia - s/p ICD implantation 06/08/12.  . Superficial venous thrombosis of upper extremity     06/2012 - LUE  . Severe aortic stenosis    s/p homograft aortic root replacement 2008  . Mitral regurgitation     s/p mitral valve repair 2008  . Thoracic aortic aneurysm [facility])     s/p resection and grafting 2008  . Stroke ([place])   . Kidney stones   . Dysrhythmia   . AICD (automatic cardioverter/defibrillator) present     2014    Past Surgical History  Procedure Laterality Date  . Foot surgery Left     removed neuroma  . Aortic root replacement  2008    homograft  . Mitral valve replacement  2008    due to severe MR  . Resection and grafting of ascending thoracic aortic    . Aneurysm and left sided maze  2008  . Coronary artery bypass graft  4/10    LIMA to LAD, SVG to OM  . Wide excision of vulva      CA INSITU  . Cardiac defibrillator placement  06/08/2012    [facility] [place] DR  implanted by Dr Rayann Heman following a cardiac arrest  . Right heart catheterization N/A 04/03/2012    Procedure: RIGHT HEART CATH;  Surgeon: [name], MD;  Location: [facility] CATH LAB;  Service: Cardiovascular;  Laterality: N/A;  . Implantable cardioverter defibrillator implant N/A 06/08/2012    Procedure: IMPLANTABLE CARDIOVERTER DEFIBRILLATOR IMPLANT;  Surgeon: Thompson Grayer, MD;  Location: [facility] CATH LAB;  Service: Cardiovascular;  Laterality: N/A;  . Cataract extraction    . Eye surgery    . Kyphoplasty N/A 12/11/2014    Procedure: KYPHOPLASTY T-12;  Surgeon: Karie Chimera, MD;  Location: [place] NEURO ORS;  Service: Neurosurgery;  Laterality: N/A;  KYPHOPLASTY T-12    There were no vitals filed for this visit.  Visit Diagnosis:  Osteoporosis  Bilateral low back pain without sciatica  Compression fracture of spine, non-traumatic, initial encounter (HCC)  Posture abnormality  Weakness                       [facility] Adult PT Treatment/Exercise - 05/01/15 0001    Ambulation/Gait   Ambulation/Gait Yes   Ambulation/Gait Assistance 4: Min guard   Assistive device Straight cane   Gait Pattern Step-through pattern    Gait Comments Gait training with cane inside the clinic and ourside the clinic on sidewalk.  Verbal cues for posture.  Total time 5 minutes with one seated rest break.   Knee/Hip Exercises: Aerobic   Nustep Level 3 x 8 minutes  seat 8, arms 9   Knee/Hip Exercises: Standing   Heel Raises Both;20 reps   Hip Abduction Stengthening;2 sets;10 reps   Abduction Limitations 2.5# added   Hip Extension Stengthening;Both;2 sets;10 reps   Extension Limitations 2.5#    Forward Step Up Both;2 sets;15 reps;Hand Hold: 2;Step Height: 6"   Rebounder weight shifting 3 ways x 1 minute each   Knee/Hip Exercises: Seated   Long Arc Quad Both;2 sets;10 reps;Strengthening;Weights   Long Arc Quad Weight --  2.5#   Marching Strengthening;Both;2 sets;10 reps;Weights   Film/video editor --  2.5#   Shoulder Exercises: ROM/Strengthening   UBE (Upper Arm Bike) Level 1 x 6 minutes (3/3)                  PT Short Term Goals - 04/14/15 1116    PT SHORT TERM GOAL #1   Title be independent in initial HEP   Status Achieved   PT SHORT TERM GOAL #2   Title stand for 10 minutes to prepare an easy meal without need to sit   Time 4   Period Weeks   Status Achieved   PT SHORT TERM GOAL #3   Title ambulate into and out of the clinic with or without device at least 50% of the time   Status Achieved   PT SHORT TERM GOAL #4   Title report a 25% reduction in LBP with standing for ADLs and self-care   Time 4   Period Weeks   Status Achieved  worst with standing at home, 1-2/10 in the clinic           PT Long Term Goals - 04/28/15 1239    PT LONG TERM GOAL #1   Title be independent in advanced HEP   Time 8   Period Weeks   Status On-going   PT LONG TERM GOAL #2   Title reduce FOTO to < or = to 51% limitation   Time 8   Period Weeks   Status On-going   PT LONG TERM GOAL #3   Title stand at home for cooking and ADLs for 15 minutes without need to rest   Time 8   Period Weeks   Status Achieved    PT LONG TERM GOAL #4   Title increase LE strength to ambulate in the community for >  or = to 5-10 minutes without significant fatigue   Time 8   Period Weeks   Status On-going   PT LONG TERM GOAL #5   Title report a 50% reduction in LBP with standing for ADLs and self-care   Time 8   Period Weeks   Status On-going               Plan - 05-12-2015 1139    Clinical Impression Statement Pt is able to stand 20 minutes at home to prepare a meal and has fatigue and pain at the end of this time.  Pt is independent with current HEP for UE/LE strength. Pt tolerated incresaed reps with arm weights well today. Pt walked with cane in clinic today with CGA by PT for safety.   FOTO score is 67% limitation today although pt reports improvement in pain and function since the start of care.  Pt will continue to benefit from skilled PT for strength, endurance, gait and pain managment as needed.     Pt will benefit from skilled therapeutic intervention in order to improve on the following deficits Pain;Postural dysfunction;Decreased strength;Decreased mobility;Impaired flexibility;Improper body mechanics;Decreased activity tolerance;Decreased endurance;Difficulty walking   Rehab Potential Good   PT Frequency 2x / week   PT Duration 8 weeks   PT Treatment/Interventions ADLs/Self Care Home Management;Cryotherapy;Moist Heat;Ultrasound;Gait training;Stair training;Functional mobility training;Therapeutic activities;Therapeutic exercise;Manual techniques;Patient/family education;Neuromuscular re-education;Passive range of motion   PT Next Visit Plan UE/LE strength and endurance, standing tolerance, postural strength, pain management as needed.     Consulted and Agree with Plan of Care Patient          G-Codes - 05-12-2015 1147    Functional Assessment Tool Used FOTO: 67% limitation   Functional Limitation Other PT primary   Other PT Primary Current Status [id number]) At least 60 percent but less than 80 percent  impaired, limited or restricted   Other PT Primary Goal Status [id number]) At least 40 percent but less than 60 percent impaired, limited or restricted      Problem List Patient Active Problem List   Diagnosis Date Noted  . ICD (implantable cardioverter-defibrillator) in place 11/28/2012  . Kidney disease, chronic, stage III (GFR 30-59 ml/min) 10/19/2012  . 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 Back pain, thoracic 02/21/2012  . Ischemic cardiomyopathy 01/26/2012  . Carcinoma in situ of vulva   . Fibroid   . Ovarian cyst, left   . Osteoporosis, idiopathic   . 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 GERD (gastroesophageal reflux disease)   . Encounter for long-term (current) use of anticoagulants 06/08/2010  . Mitral valve disease 04/16/2010  . VITAMIN D DEFICIENCY 11/05/2008  . VITAMIN B12 DEFICIENCY 10/22/2008  . Essential hypertension, benign 07/30/2008  . Celiac disease 08/09/2007  . HYPERLIPIDEMIA 07/21/2007  . Atrial fibrillation ([place]) 07/21/2007  . CHOLELITHIASIS 07/21/2007  . UNSPECIFIED IRON DEFICIENCY ANEMIA 06/06/2007    Kody Brandl, PT 05/12/2015, 12:21 PM  [facility] [address] [place], [place], [address] Phone: [phone]   Fax:  [phone]  Name: SHAUNE VIZCARRA MRN: [id number] Date of Birth: 05/28/1940

## 2015-05-06 ENCOUNTER — Ambulatory Visit: Payer: Medicare Other

## 2015-05-06 ENCOUNTER — Ambulatory Visit: Payer: Medicare Other | Admitting: Physical Therapy

## 2015-05-06 ENCOUNTER — Encounter: Payer: Self-pay | Admitting: Physical Therapy

## 2015-05-06 DIAGNOSIS — M4850XA Collapsed vertebra, not elsewhere classified, site unspecified, initial encounter for fracture: Secondary | ICD-10-CM | POA: Diagnosis not present

## 2015-05-06 DIAGNOSIS — M545 Low back pain, unspecified: Secondary | ICD-10-CM

## 2015-05-06 DIAGNOSIS — M810 Age-related osteoporosis without current pathological fracture: Secondary | ICD-10-CM

## 2015-05-06 DIAGNOSIS — R531 Weakness: Secondary | ICD-10-CM | POA: Diagnosis not present

## 2015-05-06 DIAGNOSIS — R293 Abnormal posture: Secondary | ICD-10-CM | POA: Diagnosis not present

## 2015-05-06 NOTE — Therapy (Signed)
[facility] [address] [place], [place], [address] Phone: [phone]   Fax:  [phone]  Physical Therapy Treatment  Patient Details  Name: [name] MRN: [id number] Date of Birth: [birth year] Referring Provider: [name], MD  Encounter Date: 05/06/2015      PT End of Session - 05/06/15 1120    Visit Number 11   Number of Visits 20   Date for PT Re-Evaluation 05/27/15   Authorization Type KX at visit 15   PT Start Time 1058   PT Stop Time 1143   PT Time Calculation (min) 45 min   Equipment Utilized During Treatment Back brace   Activity Tolerance Patient tolerated treatment well   Behavior During Therapy [facility] for tasks assessed/performed      Past Medical History  Diagnosis Date  . Anemia   . Atrial fibrillation [facility])     A.fib/flutter: s/p MAZE 2008, DCCV 2011, on coumadin; previously on flecainide, but dc'd due to worsening EF. Tikosyn discontinued 06/2012 after cardiac arrest.  . Diverticulitis   . CVA (cerebral vascular accident) ([place])     2008 - felt due to oscillating calcium on aortic valve  . Carcinoma in situ of vulva   . Fibroid   . Ovarian cyst, left 2008-2009  . Osteoporosis   . Diabetes mellitus   . Hypertension   . GERD (gastroesophageal reflux disease)   . Chronic systolic CHF (congestive heart failure) (HCC)     EF 35%  . Ischemic cardiomyopathy     EF 35%  . Coronary artery disease     Occluded LM 2/2 previous aortic root surgery; s/p 2v CABG 2010 LIMA to LAD, SVG to OM; Cath 03/11/12 patent SVG to OM, atretic LIMA to LAD, patent RCA, occluded native LM, EF 35%   . Arthritis     OSTEO  . Fracture of lumbar spine ([place])   . Ventricular fibrillation ([place])     VF arrest/VT/torsades 06/2012 with prolonged hosp with VDRF, cardiogen shock, asp PNA, encephalopathy, anemia, shock liver, hypernatremia - s/p ICD implantation 06/08/12.  . Superficial venous thrombosis of upper extremity      06/2012 - LUE  . Severe aortic stenosis     s/p homograft aortic root replacement 2008  . Mitral regurgitation     s/p mitral valve repair 2008  . Thoracic aortic aneurysm [facility])     s/p resection and grafting 2008  . Stroke ([place])   . Kidney stones   . Dysrhythmia   . AICD (automatic cardioverter/defibrillator) present     2014    Past Surgical History  Procedure Laterality Date  . Foot surgery Left     removed neuroma  . Aortic root replacement  2008    homograft  . Mitral valve replacement  2008    due to severe MR  . Resection and grafting of ascending thoracic aortic    . Aneurysm and left sided maze  2008  . Coronary artery bypass graft  4/10    LIMA to LAD, SVG to OM  . Wide excision of vulva      CA INSITU  . Cardiac defibrillator placement  06/08/2012    [facility]. [place] DR  implanted by Dr Rayann Heman following a cardiac arrest  . Right heart catheterization N/A 04/03/2012    Procedure: RIGHT HEART CATH;  Surgeon: [name], MD;  Location: [facility] CATH LAB;  Service: Cardiovascular;  Laterality: N/A;  . Implantable cardioverter defibrillator implant  N/A 06/08/2012    Procedure: IMPLANTABLE CARDIOVERTER DEFIBRILLATOR IMPLANT;  Surgeon: Thompson Grayer, MD;  Location: [facility] CATH LAB;  Service: Cardiovascular;  Laterality: N/A;  . Cataract extraction    . Eye surgery    . Kyphoplasty N/A 12/11/2014    Procedure: KYPHOPLASTY T-12;  Surgeon: Karie Chimera, MD;  Location: [place] NEURO ORS;  Service: Neurosurgery;  Laterality: N/A;  KYPHOPLASTY T-12    There were no vitals filed for this visit.  Visit Diagnosis:  Osteoporosis  Bilateral low back pain without sciatica  Compression fracture of spine, non-traumatic, initial encounter ([place])  Posture abnormality  Weakness      Subjective Assessment - 05/06/15 1117    Subjective Pt arrived with her RW and brought in her cane, no pain with sitting    Pertinent History Osteoporosis, Pacemaker/defibrillator, Kyphoplasty  (12/2014)   Limitations Standing;Walking   How long can you stand comfortably? 10 minutes max   How long can you walk comfortably? only around the house up to 5 minutes   Diagnostic tests x-ray, CT scan   Patient Stated Goals reduce pain, stand and walk more, wean from wheelchair use   Currently in Pain? Yes   Pain Score 1    Pain Location Back   Pain Orientation Right;Left;Lower   Pain Descriptors / Indicators Aching;Throbbing   Pain Type Chronic pain   Pain Onset More than a month ago   Pain Frequency Constant   Aggravating Factors  standing and walking, later in the day   Pain Relieving Factors heat, sitting   Effect of Pain on Daily Activities not able to stand and walk in the community   Multiple Pain Sites No                         [facility] Adult PT Treatment/Exercise - 05/06/15 0001    Ambulation/Gait   Ambulation/Gait Yes   Ambulation/Gait Assistance 5: Supervision  7 min    Assistive device Straight cane   Gait Pattern Step-through pattern   Gait Comments Gait training with cane inside the clinic and ourside the clinic on sidewalk, increased distance today. Verbal cues for posture.  Total time 68min 40sec. 2.5 weights added   Knee/Hip Exercises: Aerobic   Nustep Level 3 x 8 minutes, seat 8, arms 9   Knee/Hip Exercises: Standing   Heel Raises Both;20 reps   Hip Abduction Stengthening;2 sets;10 reps   Abduction Limitations 2.5# added   Hip Extension Stengthening;Both;2 sets;10 reps   Extension Limitations 2.5#    Forward Step Up Both;2 sets;15 reps;Hand Hold: 2;Step Height: 6"   Rebounder weight shifting 3 ways x 1 minute each   Knee/Hip Exercises: Seated   Long Arc Quad Both;2 sets;10 reps;Strengthening;Weights   Long Arc Quad Weight --  2.5   Long Arc Quad Limitations 2.5#   Marching Strengthening;Both;2 sets;10 reps;Weights   Marching Weights --  2.5   Shoulder Exercises: ROM/Strengthening   UBE (Upper Arm Bike) Level 1 x 6 minutes (3/3)                   PT Short Term Goals - 04/14/15 1116    PT SHORT TERM GOAL #1   Title be independent in initial HEP   Status Achieved   PT SHORT TERM GOAL #2   Title stand for 10 minutes to prepare an easy meal without need to sit   Time 4   Period Weeks   Status Achieved   PT SHORT  TERM GOAL #3   Title ambulate into and out of the clinic with or without device at least 50% of the time   Status Achieved   PT SHORT TERM GOAL #4   Title report a 25% reduction in LBP with standing for ADLs and self-care   Time 4   Period Weeks   Status Achieved  worst with standing at home, 1-2/10 in the clinic           PT Long Term Goals - 05/06/15 1136    PT [place] #1   Title be independent in advanced HEP   Time 8   Period Weeks   Status On-going   PT LONG TERM GOAL #2   Title reduce FOTO to < or = to 51% limitation   Time 8   Period Weeks   Status On-going   PT LONG TERM GOAL #3   Title stand at home for cooking and ADLs for 15 minutes without need to rest   Time 8   Period Weeks   Status Achieved   PT LONG TERM GOAL #4   Title increase LE strength to ambulate in the community for > or = to 5-10 minutes without significant fatigue   Time 8   Period Weeks   Status On-going   PT LONG TERM GOAL #5   Title report a 50% reduction in LBP with standing for ADLs and self-care   Time 8   Period Weeks   Status On-going               Plan - 05/06/15 1133    Clinical Impression Statement Pt is ableto walk for 69min with Rolling walker at grocery store, but after very exhausted. Pt was able to increase the walking time wiht SPC with S in PT clinic. Pt will continue to benefit from skilled PT for strength, endurance, gait and pain management as needed   Pt will benefit from skilled therapeutic intervention in order to improve on the following deficits Pain;Postural dysfunction;Decreased strength;Decreased mobility;Impaired flexibility;Improper body  mechanics;Decreased activity tolerance;Decreased endurance;Difficulty walking   Rehab Potential Good   PT Frequency 2x / week   PT Duration 8 weeks   PT Treatment/Interventions ADLs/Self Care Home Management;Cryotherapy;Moist Heat;Ultrasound;Gait training;Stair training;Functional mobility training;Therapeutic activities;Therapeutic exercise;Manual techniques;Patient/family education;Neuromuscular re-education;Passive range of motion   PT Next Visit Plan UE/LE strength and endurance, standing tolerance, postural strength, pain management as needed.     Consulted and Agree with Plan of Care Patient        Problem List Patient Active Problem List   Diagnosis Date Noted  . ICD (implantable cardioverter-defibrillator) in place 11/28/2012  . Kidney disease, chronic, stage III (GFR 30-59 ml/min) 10/19/2012  . 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 Back pain, thoracic 02/21/2012  . Ischemic cardiomyopathy 01/26/2012  . Carcinoma in situ of vulva   . Fibroid   . Ovarian cyst, left   . Osteoporosis, idiopathic   . 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 GERD (gastroesophageal reflux disease)   . Encounter for long-term (current) use of anticoagulants 06/08/2010  . Mitral valve disease 04/16/2010  . VITAMIN D DEFICIENCY 11/05/2008  . VITAMIN B12 DEFICIENCY 10/22/2008  . Essential hypertension, benign 07/30/2008  . Celiac disease 08/09/2007  . HYPERLIPIDEMIA 07/21/2007  . Atrial fibrillation ([place]) 07/21/2007  . CHOLELITHIASIS 07/21/2007  . UNSPECIFIED IRON DEFICIENCY ANEMIA 06/06/2007    NAUMANN-HOUEGNIFIO,Arjuna Doeden PTA 05/06/2015, 11:44 AM  [facility] [address], [place], [place], [address] Phone: [phone]   Fax:  [phone]  Name: [name] MRN: [id number] Date of Birth: [birth year]

## 2015-05-07 DIAGNOSIS — E1121 Type 2 diabetes mellitus with diabetic nephropathy: Secondary | ICD-10-CM | POA: Diagnosis not present

## 2015-05-07 DIAGNOSIS — Z1389 Encounter for screening for other disorder: Secondary | ICD-10-CM | POA: Diagnosis not present

## 2015-05-07 DIAGNOSIS — I130 Hypertensive heart and chronic kidney disease with heart failure and stage 1 through stage 4 chronic kidney disease, or unspecified chronic kidney disease: Secondary | ICD-10-CM | POA: Diagnosis not present

## 2015-05-07 DIAGNOSIS — M810 Age-related osteoporosis without current pathological fracture: Secondary | ICD-10-CM | POA: Diagnosis not present

## 2015-05-07 DIAGNOSIS — I272 Other secondary pulmonary hypertension: Secondary | ICD-10-CM | POA: Diagnosis not present

## 2015-05-07 DIAGNOSIS — M4850XS Collapsed vertebra, not elsewhere classified, site unspecified, sequela of fracture: Secondary | ICD-10-CM | POA: Diagnosis not present

## 2015-05-07 DIAGNOSIS — D692 Other nonthrombocytopenic purpura: Secondary | ICD-10-CM | POA: Diagnosis not present

## 2015-05-07 DIAGNOSIS — E11638 Type 2 diabetes mellitus with other oral complications: Secondary | ICD-10-CM | POA: Diagnosis not present

## 2015-05-07 DIAGNOSIS — Z6826 Body mass index (BMI) 26.0-26.9, adult: Secondary | ICD-10-CM | POA: Diagnosis not present

## 2015-05-07 DIAGNOSIS — I10 Essential (primary) hypertension: Secondary | ICD-10-CM | POA: Diagnosis not present

## 2015-05-07 DIAGNOSIS — N183 Chronic kidney disease, stage 3 (moderate): Secondary | ICD-10-CM | POA: Diagnosis not present

## 2015-05-07 DIAGNOSIS — R946 Abnormal results of thyroid function studies: Secondary | ICD-10-CM | POA: Diagnosis not present

## 2015-05-08 ENCOUNTER — Encounter: Payer: Self-pay | Admitting: Physical Therapy

## 2015-05-08 ENCOUNTER — Ambulatory Visit: Payer: Medicare Other | Attending: Sports Medicine | Admitting: Physical Therapy

## 2015-05-08 DIAGNOSIS — R293 Abnormal posture: Secondary | ICD-10-CM

## 2015-05-08 DIAGNOSIS — Z1212 Encounter for screening for malignant neoplasm of rectum: Secondary | ICD-10-CM | POA: Diagnosis not present

## 2015-05-08 DIAGNOSIS — R531 Weakness: Secondary | ICD-10-CM | POA: Diagnosis not present

## 2015-05-08 DIAGNOSIS — M545 Low back pain, unspecified: Secondary | ICD-10-CM

## 2015-05-08 DIAGNOSIS — M810 Age-related osteoporosis without current pathological fracture: Secondary | ICD-10-CM | POA: Diagnosis not present

## 2015-05-08 DIAGNOSIS — M4850XA Collapsed vertebra, not elsewhere classified, site unspecified, initial encounter for fracture: Secondary | ICD-10-CM | POA: Diagnosis not present

## 2015-05-08 NOTE — Therapy (Signed)
[facility] [address], [place], [place], [address] Phone: [phone]   Fax:  [phone]  Physical Therapy Treatment  Patient Details  Name: [name] MRN: [id number] Date of Birth: 09-Feb-1941 Referring Provider: [name], MD  Encounter Date: 05/08/2015      PT End of Session - 05/08/15 1101    Visit Number 12   Number of Visits 20   Date for PT Re-Evaluation 05/27/15   Authorization Type KX at visit 15   PT Start Time 1054   PT Stop Time 1140   PT Time Calculation (min) 46 min   Equipment Utilized During Treatment Back brace   Activity Tolerance Patient tolerated treatment well   Behavior During Therapy [facility] for tasks assessed/performed      Past Medical History  Diagnosis Date  . Anemia   . Atrial fibrillation [facility])     A.fib/flutter: s/p MAZE 2008, DCCV 2011, on coumadin; previously on flecainide, but dc'd due to worsening EF. Tikosyn discontinued 06/2012 after cardiac arrest.  . Diverticulitis   . CVA (cerebral vascular accident) ([place])     2008 - felt due to oscillating calcium on aortic valve  . Carcinoma in situ of vulva   . Fibroid   . Ovarian cyst, left 2008-2009  . Osteoporosis   . Diabetes mellitus   . Hypertension   . GERD (gastroesophageal reflux disease)   . Chronic systolic CHF (congestive heart failure) (HCC)     EF 35%  . Ischemic cardiomyopathy     EF 35%  . Coronary artery disease     Occluded LM 2/2 previous aortic root surgery; s/p 2v CABG 2010 LIMA to LAD, SVG to OM; Cath 03/11/12 patent SVG to OM, atretic LIMA to LAD, patent RCA, occluded native LM, EF 35%   . Arthritis     OSTEO  . Fracture of lumbar spine ([place])   . Ventricular fibrillation ([place])     VF arrest/VT/torsades 06/2012 with prolonged hosp with VDRF, cardiogen shock, asp PNA, encephalopathy, anemia, shock liver, hypernatremia - s/p ICD implantation 06/08/12.  . Superficial venous thrombosis of upper extremity      06/2012 - LUE  . Severe aortic stenosis     s/p homograft aortic root replacement 2008  . Mitral regurgitation     s/p mitral valve repair 2008  . Thoracic aortic aneurysm [facility])     s/p resection and grafting 2008  . Stroke ([place])   . Kidney stones   . Dysrhythmia   . AICD (automatic cardioverter/defibrillator) present     2014    Past Surgical History  Procedure Laterality Date  . Foot surgery Left     removed neuroma  . Aortic root replacement  2008    homograft  . Mitral valve replacement  2008    due to severe MR  . Resection and grafting of ascending thoracic aortic    . Aneurysm and left sided maze  2008  . Coronary artery bypass graft  4/10    LIMA to LAD, SVG to OM  . Wide excision of vulva      CA INSITU  . Cardiac defibrillator placement  06/08/2012    [address] [place] DR  implanted by Dr Rayann Heman following a cardiac arrest  . Right heart catheterization N/A 04/03/2012    Procedure: RIGHT HEART CATH;  Surgeon: [name], MD;  Location[facility] C[facility] LAB;  Service: Cardiovascular;  Laterality: N/A;  . Implantable cardioverter defibrillator implant  N/A 06/08/2012    Procedure: IMPLANTABLE CARDIOVERTER DEFIBRILLATOR IMPLANT;  Surgeon: Thompson Grayer, MD;  Location: [facility] CATH LAB;  Service: Cardiovascular;  Laterality: N/A;  . Cataract extraction    . Eye surgery    . Kyphoplasty N/A 12/11/2014    Procedure: KYPHOPLASTY T-12;  Surgeon: Karie Chimera, MD;  Location: [place] NEURO ORS;  Service: Neurosurgery;  Laterality: N/A;  KYPHOPLASTY T-12    There were no vitals filed for this visit.  Visit Diagnosis:  Osteoporosis  Bilateral low back pain without sciatica  Compression fracture of spine, non-traumatic, initial encounter ([place])  Posture abnormality  Weakness      Subjective Assessment - 05/08/15 1057    Subjective Pt arrived with her SPC, she reports using it now for 30% of the time   Pertinent History Osteoporosis, Pacemaker/defibrillator, Kyphoplasty  (12/2014)   Limitations Standing;Walking   How long can you stand comfortably? 10 minutes max   How long can you walk comfortably? only around the house up to 5 minutes   Diagnostic tests x-ray, CT scan   Patient Stated Goals reduce pain, stand and walk more, wean from wheelchair use   Currently in Pain? Yes   Pain Score 1    Pain Location Back   Pain Orientation Right;Left;Lower   Pain Descriptors / Indicators Aching;Throbbing   Pain Type Chronic pain   Pain Onset More than a month ago   Pain Frequency Constant   Aggravating Factors  standing and walking, later in the day   Pain Relieving Factors heat, sitting   Effect of Pain on Daily Activities not able to stand and walk in the community   Multiple Pain Sites No                         [facility] Adult PT Treatment/Exercise - 05/08/15 0001    Bed Mobility   Bed Mobility --  Educated/practiced with Husband Fritz Pickerel donn/off softbrace    Ambulation/Gait   Ambulation/Gait Yes   Ambulation/Gait Assistance 5: Supervision   Assistive device Straight cane   Gait Pattern Step-through pattern   Gait Comments Gait training with cane inside the clinic and outside the clinic on sidewalk, increased distance today. Verbal cues for posture.  Total time 33min 40sec. 2.5 weights added   Knee/Hip Exercises: Aerobic   Nustep Level 3 x 8 minutes, seat 8, arms 9   Knee/Hip Exercises: Standing   Heel Raises Both;20 reps   Hip Abduction Stengthening;2 sets;10 reps   Abduction Limitations 2.5# added   Hip Extension Stengthening;Both;2 sets;10 reps   Extension Limitations 2.5#    Forward Step Up Both;2 sets;15 reps;Hand Hold: 2;Step Height: 6"   Rebounder weight shifting 3 ways x 1 minute each   Knee/Hip Exercises: Seated   Long Arc Quad Both;2 sets;10 reps;Strengthening;Weights   Long Arc Quad Limitations 2.5#   Marching Strengthening;Both;2 sets;10 reps;Weights   Marching Weights --  2.5   Shoulder Exercises: ROM/Strengthening    UBE (Upper Arm Bike) --                  PT Short Term Goals - 04/14/15 1116    PT SHORT TERM GOAL #1   Title be independent in initial HEP   Status Achieved   PT SHORT TERM GOAL #2   Title stand for 10 minutes to prepare an easy meal without need to sit   Time 4   Period Weeks   Status Achieved   PT SHORT TERM  GOAL #3   Title ambulate into and out of the clinic with or without device at least 50% of the time   Status Achieved   PT SHORT TERM GOAL #4   Title report a 25% reduction in LBP with standing for ADLs and self-care   Time 4   Period Weeks   Status Achieved  worst with standing at home, 1-2/10 in the clinic           PT Long Term Goals - 05/06/15 1136    PT [place] #1   Title be independent in advanced HEP   Time 8   Period Weeks   Status On-going   PT LONG TERM GOAL #2   Title reduce FOTO to < or = to 51% limitation   Time 8   Period Weeks   Status On-going   PT LONG TERM GOAL #3   Title stand at home for cooking and ADLs for 15 minutes without need to rest   Time 8   Period Weeks   Status Achieved   PT LONG TERM GOAL #4   Title increase LE strength to ambulate in the community for > or = to 5-10 minutes without significant fatigue   Time 8   Period Weeks   Status On-going   PT LONG TERM GOAL #5   Title report a 50% reduction in LBP with standing for ADLs and self-care   Time 8   Period Weeks   Status On-going               Plan - 05/08/15 1102    Clinical Impression Statement Pt is now using her SPC more often. Pt will continue to benefit from skilled PT for strength, endurance, gait and pain mangament  as needed   Pt will benefit from skilled therapeutic intervention in order to improve on the following deficits Pain;Postural dysfunction;Decreased strength;Decreased mobility;Impaired flexibility;Improper body mechanics;Decreased activity tolerance;Decreased endurance;Difficulty walking   Rehab Potential Good   PT  Frequency 2x / week   PT Duration 8 weeks   PT Treatment/Interventions ADLs/Self Care Home Management;Cryotherapy;Moist Heat;Ultrasound;Gait training;Stair training;Functional mobility training;Therapeutic activities;Therapeutic exercise;Manual techniques;Patient/family education;Neuromuscular re-education;Passive range of motion   PT Next Visit Plan UE/LE strength and endurance, standing tolerance, postural strength, pain management as needed.     Consulted and Agree with Plan of Care Patient        Problem List Patient Active Problem List   Diagnosis Date Noted  . ICD (implantable cardioverter-defibrillator) in place 11/28/2012  . Kidney disease, chronic, stage III (GFR 30-59 ml/min) 10/19/2012  . 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 Back pain, thoracic 02/21/2012  . Ischemic cardiomyopathy 01/26/2012  . Carcinoma in situ of vulva   . Fibroid   . Ovarian cyst, left   . Osteoporosis, idiopathic   . 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 GERD (gastroesophageal reflux disease)   . Encounter for long-term (current) use of anticoagulants 06/08/2010  . Mitral valve disease 04/16/2010  . VITAMIN D DEFICIENCY 11/05/2008  . VITAMIN B12 DEFICIENCY 10/22/2008  . Essential hypertension, benign 07/30/2008  . Celiac disease 08/09/2007  . HYPERLIPIDEMIA 07/21/2007  . Atrial fibrillation ([place]) 07/21/2007  . CHOLELITHIASIS 07/21/2007  . UNSPECIFIED IRON DEFICIENCY ANEMIA 06/06/2007    NAUMANN-HOUEGNIFIO,Jayme Mednick PTA 05/08/2015, 11:50 AM  [facility] [address] [place], [place], [address] Phone: [phone]   Fax:  [phone]  Name: [name] MRN: [id number] Date of Birth: Mar 25, 1940

## 2015-05-09 DIAGNOSIS — S22080D Wedge compression fracture of T11-T12 vertebra, subsequent encounter for fracture with routine healing: Secondary | ICD-10-CM | POA: Diagnosis not present

## 2015-05-09 DIAGNOSIS — M810 Age-related osteoporosis without current pathological fracture: Secondary | ICD-10-CM | POA: Diagnosis not present

## 2015-05-12 ENCOUNTER — Ambulatory Visit (INDEPENDENT_AMBULATORY_CARE_PROVIDER_SITE_OTHER): Payer: Medicare Other | Admitting: *Deleted

## 2015-05-12 ENCOUNTER — Ambulatory Visit (INDEPENDENT_AMBULATORY_CARE_PROVIDER_SITE_OTHER): Payer: Medicare Other

## 2015-05-12 DIAGNOSIS — Z7901 Long term (current) use of anticoagulants: Secondary | ICD-10-CM

## 2015-05-12 DIAGNOSIS — I4891 Unspecified atrial fibrillation: Secondary | ICD-10-CM

## 2015-05-12 DIAGNOSIS — I059 Rheumatic mitral valve disease, unspecified: Secondary | ICD-10-CM

## 2015-05-12 DIAGNOSIS — I255 Ischemic cardiomyopathy: Secondary | ICD-10-CM | POA: Diagnosis not present

## 2015-05-12 DIAGNOSIS — I359 Nonrheumatic aortic valve disorder, unspecified: Secondary | ICD-10-CM | POA: Diagnosis not present

## 2015-05-12 DIAGNOSIS — Z95810 Presence of automatic (implantable) cardiac defibrillator: Secondary | ICD-10-CM

## 2015-05-12 DIAGNOSIS — K5909 Other constipation: Secondary | ICD-10-CM | POA: Diagnosis not present

## 2015-05-12 DIAGNOSIS — L89311 Pressure ulcer of right buttock, stage 1: Secondary | ICD-10-CM | POA: Diagnosis not present

## 2015-05-12 LAB — POCT INR: INR: 2.1

## 2015-05-12 NOTE — Progress Notes (Signed)
EPIC Encounter for ICM Monitoring  Patient Name: [name] is a 75 y.o. female Date: 05/12/2015 Primary Care Physican: Velna Hatchet, MD Primary Cardiologist: Stanford Breed Electrophysiologist: Allred Dry Weight: Not weighing at home       In the past month, have you:  1. Gained more than 2 pounds in a day or more than 5 pounds in a week? unsure  2. Had changes in your medications (with verification of current medications)? no  3. Had more shortness of breath than is usual for you? no  4. Limited your activity because of shortness of breath? no  5. Not been able to sleep because of shortness of breath? no  6. Had increased swelling in your feet or ankles? no  7. Had symptoms of dehydration (dizziness, dry mouth, increased thirst, decreased urine output) no  8. Had changes in sodium restriction? no  9. Been compliant with medication? Yes   ICM trend: 3 month view for 05/12/2015   ICM trend: 1 year view for 05/12/2015   Follow-up plan: ICM clinic phone appointment on 06/16/2015.  [place] thoracic impedance trending along reference suggesting stable fluid levels.  She reported she is feeling better and denied any fluid symptoms.   Her back pain has decreased some but still using walker and cannot stand for long periods of time.  She stated she is drinking more fluids and advised to limit to 64 oz daily.   Encouraged her to call for any fluid symptoms.  No changes today.    Copy of note sent to patient's primary care physician, primary cardiologist, and device following physician.  Rosalene Billings, RN, CCM 05/12/2015 5:08 PM

## 2015-05-13 ENCOUNTER — Encounter: Payer: Self-pay | Admitting: Physical Therapy

## 2015-05-13 ENCOUNTER — Ambulatory Visit: Payer: Medicare Other | Admitting: Physical Therapy

## 2015-05-13 DIAGNOSIS — R531 Weakness: Secondary | ICD-10-CM | POA: Diagnosis not present

## 2015-05-13 DIAGNOSIS — M4850XA Collapsed vertebra, not elsewhere classified, site unspecified, initial encounter for fracture: Secondary | ICD-10-CM | POA: Diagnosis not present

## 2015-05-13 DIAGNOSIS — R293 Abnormal posture: Secondary | ICD-10-CM | POA: Diagnosis not present

## 2015-05-13 DIAGNOSIS — M545 Low back pain, unspecified: Secondary | ICD-10-CM

## 2015-05-13 DIAGNOSIS — M810 Age-related osteoporosis without current pathological fracture: Secondary | ICD-10-CM | POA: Diagnosis not present

## 2015-05-13 NOTE — Therapy (Signed)
[facility] [address] [place], [place], [address] Phone: [phone]   Fax:  [phone]  Physical Therapy Treatment  Patient Details  Name: [name] MRN: [id number] Date of Birth: 1940-12-28 Referring Provider: [name], MD  Encounter Date: 05/13/2015      PT End of Session - 05/13/15 1128    Visit Number 13   Number of Visits 20   Date for PT Re-Evaluation 05/27/15   Authorization Type KX at visit 15   PT Start Time 1055   PT Stop Time 1140   PT Time Calculation (min) 45 min   Equipment Utilized During Treatment Back brace   Activity Tolerance Patient tolerated treatment well      Past Medical History  Diagnosis Date  . Anemia   . Atrial fibrillation [facility])     A.fib/flutter: s/p MAZE 2008, DCCV 2011, on coumadin; previously on flecainide, but dc'd due to worsening EF. Tikosyn discontinued 06/2012 after cardiac arrest.  . Diverticulitis   . CVA (cerebral vascular accident) ([place])     2008 - felt due to oscillating calcium on aortic valve  . Carcinoma in situ of vulva   . Fibroid   . Ovarian cyst, left 2008-2009  . Osteoporosis   . Diabetes mellitus   . Hypertension   . GERD (gastroesophageal reflux disease)   . Chronic systolic CHF (congestive heart failure) (HCC)     EF 35%  . Ischemic cardiomyopathy     EF 35%  . Coronary artery disease     Occluded LM 2/2 previous aortic root surgery; s/p 2v CABG 2010 LIMA to LAD, SVG to OM; Cath 03/11/12 patent SVG to OM, atretic LIMA to LAD, patent RCA, occluded native LM, EF 35%   . Arthritis     OSTEO  . Fracture of lumbar spine ([place])   . Ventricular fibrillation ([place])     VF arrest/VT/torsades 06/2012 with prolonged hosp with VDRF, cardiogen shock, asp PNA, encephalopathy, anemia, shock liver, hypernatremia - s/p ICD implantation 06/08/12.  . Superficial venous thrombosis of upper extremity     06/2012 - LUE  . Severe aortic stenosis     s/p homograft  aortic root replacement 2008  . Mitral regurgitation     s/p mitral valve repair 2008  . Thoracic aortic aneurysm [facility])     s/p resection and grafting 2008  . Stroke ([place])   . Kidney stones   . Dysrhythmia   . AICD (automatic cardioverter/defibrillator) present     2014    Past Surgical History  Procedure Laterality Date  . Foot surgery Left     removed neuroma  . Aortic root replacement  2008    homograft  . Mitral valve replacement  2008    due to severe MR  . Resection and grafting of ascending thoracic aortic    . Aneurysm and left sided maze  2008  . Coronary artery bypass graft  4/10    LIMA to LAD, SVG to OM  . Wide excision of vulva      CA INSITU  . Cardiac defibrillator placement  06/08/2012    St. [place] DR  implanted by Dr Rayann Heman following a cardiac arrest  . Right heart catheterization N/A 04/03/2012    Procedure: RIGHT HEART CATH;  Surgeon: [name], MD;  Location: [facility] CATH LAB;  Service: Cardiovascular;  Laterality: N/A;  . Implantable cardioverter defibrillator implant N/A 06/08/2012    Procedure: IMPLANTABLE CARDIOVERTER DEFIBRILLATOR  IMPLANT;  Surgeon: Thompson Grayer, MD;  Location: [facility] CATH LAB;  Service: Cardiovascular;  Laterality: N/A;  . Cataract extraction    . Eye surgery    . Kyphoplasty N/A 12/11/2014    Procedure: KYPHOPLASTY T-12;  Surgeon: Karie Chimera, MD;  Location: [place] NEURO ORS;  Service: Neurosurgery;  Laterality: N/A;  KYPHOPLASTY T-12    There were no vitals filed for this visit.  Visit Diagnosis:  Osteoporosis  Bilateral low back pain without sciatica  Compression fracture of spine, non-traumatic, initial encounter ([place])  Posture abnormality  Weakness      Subjective Assessment - 05/13/15 1125    Subjective Pt arrived with her SPC, she reports was at [place] and [employer] where she tried to use shopping charts but difficult for patient since she is not tall and RW helps her better   Pertinent History  Osteoporosis, Pacemaker/defibrillator, Kyphoplasty (12/2014)   Limitations Standing;Walking   How long can you stand comfortably? 10 minutes max   How long can you walk comfortably? only around the house up to 5 minutes   Diagnostic tests x-ray, CT scan   Patient Stated Goals reduce pain, stand and walk more, wean from wheelchair use   Currently in Pain? Yes   Pain Score 1    Pain Location Back   Pain Orientation Right;Left;Lower   Pain Descriptors / Indicators Aching;Throbbing   Pain Type Chronic pain   Pain Onset More than a month ago   Pain Frequency Constant   Aggravating Factors  standing and walking, later in the day   Pain Relieving Factors heat, sitting   Effect of Pain on Daily Activities not able to stand and walk in the commounity   Multiple Pain Sites No                         [facility] Adult PT Treatment/Exercise - 05/13/15 0001    Ambulation/Gait   Ambulation/Gait Yes   Ambulation/Gait Assistance 5: Supervision   Assistive device Straight cane   Gait Pattern Step-through pattern   Gait Comments Gait training with cane inside the clinic and outside the clinic on sidewalk, increased distance today. Verbal cues for posture.  Total time 49min. 2.5 weights added   Knee/Hip Exercises: Aerobic   Nustep Level 3 x 8 minutes, seat 8, arms 9   Knee/Hip Exercises: Standing   Heel Raises Both;20 reps   Hip Abduction Stengthening;2 sets;10 reps   Abduction Limitations 2.5# added   Hip Extension Stengthening;Both;2 sets;10 reps   Extension Limitations 2.5#    Forward Step Up Both;2 sets;15 reps;Hand Hold: 2;Step Height: 6"   Knee/Hip Exercises: Seated   Long Arc Quad Both;2 sets;10 reps;Strengthening;Weights   Long Arc Quad Limitations 2.5#   Marching Strengthening;Both;2 sets;10 reps;Weights   Marching Weights 2 lbs.  2.5#                  PT Short Term Goals - 04/14/15 1116    PT SHORT TERM GOAL #1   Title be independent in initial HEP   Status  Achieved   PT SHORT TERM GOAL #2   Title stand for 10 minutes to prepare an easy meal without need to sit   Time 4   Period Weeks   Status Achieved   PT SHORT TERM GOAL #3   Title ambulate into and out of the clinic with or without device at least 50% of the time   Status Achieved   PT SHORT  TERM GOAL #4   Title report a 25% reduction in LBP with standing for ADLs and self-care   Time 4   Period Weeks   Status Achieved  worst with standing at home, 1-2/10 in the clinic           PT Long Term Goals - 05/13/15 1129    PT LONG TERM GOAL #1   Title be independent in advanced HEP   Time 8   Period Weeks   Status On-going   PT LONG TERM GOAL #2   Title reduce FOTO to < or = to 51% limitation   Time 8   Period Weeks   Status On-going   PT LONG TERM GOAL #3   Title stand at home for cooking and ADLs for 15 minutes without need to rest   Time 8   Period Weeks   Status Achieved   PT LONG TERM GOAL #4   Title increase LE strength to ambulate in the community for > or = to 5-10 minutes without significant fatigue   Time 8   Period Weeks   Status On-going   PT LONG TERM GOAL #5   Title report a 50% reduction in LBP with standing for ADLs and self-care   Time 8   Period Weeks   Status On-going               Plan - 05/13/15 1128    Clinical Impression Statement Pt will continue to benefit from skilled PT for advancement in strength, endurance, gait and pain mangament.   Rehab Potential Good   PT Frequency 2x / week   PT Duration 8 weeks   PT Treatment/Interventions ADLs/Self Care Home Management;Cryotherapy;Moist Heat;Ultrasound;Gait training;Stair training;Functional mobility training;Therapeutic activities;Therapeutic exercise;Manual techniques;Patient/family education;Neuromuscular re-education;Passive range of motion   PT Next Visit Plan UE/LE strength and endurance, standing tolerance, postural strength, pain management as needed.     Consulted and Agree with  Plan of Care Patient        Problem List Patient Active Problem List   Diagnosis Date Noted  . ICD (implantable cardioverter-defibrillator) in place 11/28/2012  . Kidney disease, chronic, stage III (GFR 30-59 ml/min) 10/19/2012  . 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 Back pain, thoracic 02/21/2012  . Ischemic cardiomyopathy 01/26/2012  . Carcinoma in situ of vulva   . Fibroid   . Ovarian cyst, left   . Osteoporosis, idiopathic   . 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 GERD (gastroesophageal reflux disease)   . Encounter for long-term (current) use of anticoagulants 06/08/2010  . Mitral valve disease 04/16/2010  . VITAMIN D DEFICIENCY 11/05/2008  . VITAMIN B12 DEFICIENCY 10/22/2008  . Essential hypertension, benign 07/30/2008  . Celiac disease 08/09/2007  . HYPERLIPIDEMIA 07/21/2007  . Atrial fibrillation ([place]) 07/21/2007  . CHOLELITHIASIS 07/21/2007  . UNSPECIFIED IRON DEFICIENCY ANEMIA 06/06/2007    NAUMANN-HOUEGNIFIO,Carime Dinkel PTA 05/13/2015, 11:42 AM  [facility] [address], [place], [place], [address] Phone: [phone]   Fax:  [phone]  Name: [name] MRN: [id number] Date of Birth: 15-Aug-1940

## 2015-05-15 ENCOUNTER — Encounter: Payer: Self-pay | Admitting: Physical Therapy

## 2015-05-15 ENCOUNTER — Ambulatory Visit: Payer: Medicare Other | Admitting: Physical Therapy

## 2015-05-15 DIAGNOSIS — R293 Abnormal posture: Secondary | ICD-10-CM

## 2015-05-15 DIAGNOSIS — M545 Low back pain, unspecified: Secondary | ICD-10-CM

## 2015-05-15 DIAGNOSIS — R531 Weakness: Secondary | ICD-10-CM

## 2015-05-15 DIAGNOSIS — M810 Age-related osteoporosis without current pathological fracture: Secondary | ICD-10-CM

## 2015-05-15 DIAGNOSIS — M4850XA Collapsed vertebra, not elsewhere classified, site unspecified, initial encounter for fracture: Secondary | ICD-10-CM | POA: Diagnosis not present

## 2015-05-15 NOTE — Therapy (Signed)
[facility] [address], [place], [place], [address] Phone: [phone]   Fax:  [phone]  Physical Therapy Treatment  Patient Details  Name: [name] MRN: [id number] Date of Birth: November 16, 1940 Referring Provider: [name], MD  Encounter Date: 05/15/2015      PT End of Session - 05/15/15 1458    Visit Number 14   Number of Visits 20   Date for PT Re-Evaluation 05/27/15   Authorization Type KX at visit 15   PT Start Time 1538   PT Stop Time 1525   PT Time Calculation (min) 1427 min   Equipment Utilized During Treatment Back brace   Activity Tolerance Patient tolerated treatment well   Behavior During Therapy [facility] for tasks assessed/performed      Past Medical History  Diagnosis Date  . Anemia   . Atrial fibrillation [facility])     A.fib/flutter: s/p MAZE 2008, DCCV 2011, on coumadin; previously on flecainide, but dc'd due to worsening EF. Tikosyn discontinued 06/2012 after cardiac arrest.  . Diverticulitis   . CVA (cerebral vascular accident) ([place])     2008 - felt due to oscillating calcium on aortic valve  . Carcinoma in situ of vulva   . Fibroid   . Ovarian cyst, left 2008-2009  . Osteoporosis   . Diabetes mellitus   . Hypertension   . GERD (gastroesophageal reflux disease)   . Chronic systolic CHF (congestive heart failure) (HCC)     EF 35%  . Ischemic cardiomyopathy     EF 35%  . Coronary artery disease     Occluded LM 2/2 previous aortic root surgery; s/p 2v CABG 2010 LIMA to LAD, SVG to OM; Cath 03/11/12 patent SVG to OM, atretic LIMA to LAD, patent RCA, occluded native LM, EF 35%   . Arthritis     OSTEO  . Fracture of lumbar spine ([place])   . Ventricular fibrillation ([place])     VF arrest/VT/torsades 06/2012 with prolonged hosp with VDRF, cardiogen shock, asp PNA, encephalopathy, anemia, shock liver, hypernatremia - s/p ICD implantation 06/08/12.  . Superficial venous thrombosis of upper extremity      06/2012 - LUE  . Severe aortic stenosis     s/p homograft aortic root replacement 2008  . Mitral regurgitation     s/p mitral valve repair 2008  . Thoracic aortic aneurysm [facility])     s/p resection and grafting 2008  . Stroke ([place])   . Kidney stones   . Dysrhythmia   . AICD (automatic cardioverter/defibrillator) present     2014    Past Surgical History  Procedure Laterality Date  . Foot surgery Left     removed neuroma  . Aortic root replacement  2008    homograft  . Mitral valve replacement  2008    due to severe MR  . Resection and grafting of ascending thoracic aortic    . Aneurysm and left sided maze  2008  . Coronary artery bypass graft  4/10    LIMA to LAD, SVG to OM  . Wide excision of vulva      CA INSITU  . Cardiac defibrillator placement  06/08/2012    [facility] DR  implanted by Dr Rayann Heman following a cardiac arrest  . Right heart catheterization N/A 04/03/2012    Procedure: RIGHT HEART CATH;  Surgeon: [name], MD;  Location: [facility] CATH LAB;  Service: Cardiovascular;  Laterality: N/A;  . Implantable cardioverter defibrillator implant  N/A 06/08/2012    Procedure: IMPLANTABLE CARDIOVERTER DEFIBRILLATOR IMPLANT;  Surgeon: Thompson Grayer, MD;  Location: [facility] CATH LAB;  Service: Cardiovascular;  Laterality: N/A;  . Cataract extraction    . Eye surgery    . Kyphoplasty N/A 12/11/2014    Procedure: KYPHOPLASTY T-12;  Surgeon: Karie Chimera, MD;  Location: [place] [facility]URO ORS;  Service: Neurosurgery;  Laterality: N/A;  KYPHOPLASTY T-12    There were no vitals filed for this visit.  Visit Diagnosis:  Osteoporosis  Bilateral low back pain without sciatica  Compression fracture of spine, non-traumatic, initial encounter ([place])  Posture abnormality  Weakness      Subjective Assessment - 05/15/15 1455    Subjective Pt arrived with her SPC, she reports has been for 1 hour at [place] this am using her walker, pt with good energie this pm.   Pertinent  History Osteoporosis, Pacemaker/defibrillator, Kyphoplasty (12/2014)   Limitations Standing;Walking   How long can you stand comfortably? 10 minutes max   How long can you walk comfortably? only around the house up to 5 minutes   Diagnostic tests x-ray, CT scan   Patient Stated Goals reduce pain, stand and walk more, wean from wheelchair use   Currently in Pain? Yes   Pain Score 1    Pain Location Back   Pain Orientation Right;Left;Lower   Pain Descriptors / Indicators Aching;Throbbing   Pain Type Chronic pain   Pain Onset More than a month ago   Pain Frequency Constant   Aggravating Factors  standing and walking, later in the day   Pain Relieving Factors heat, sitting   Multiple Pain Sites No                         [facility] Adult PT Treatment/Exercise - 05/15/15 0001    Ambulation/Gait   Ambulation/Gait Yes   Ambulation/Gait Assistance 5: Supervision   Assistive device Straight cane   Gait Pattern Step-through pattern   Gait Comments Gait training with cane inside the clinic and outside the clinic to end of sidewalk. Verbal cues for posture.  Total time 26min33sec with 2.5 weights added   Knee/Hip Exercises: Aerobic   Nustep Level 3 x 8 minutes, seat 8, arms 9   Knee/Hip Exercises: Standing   Heel Raises Both;20 reps   Hip Abduction Stengthening;2 sets;10 reps   Abduction Limitations 2.5# added   Hip Extension Stengthening;Both;2 sets;10 reps   Extension Limitations 2.5#    Forward Step Up Both;2 sets;15 reps;Hand Hold: 2;Step Height: 6"   Knee/Hip Exercises: Seated   Long Arc Quad Both;2 sets;10 reps;Strengthening;Weights   Long Arc Quad Limitations 2.5#   Marching Strengthening;Both;2 sets;10 reps;Weights   Marching Weights --  2.5#                  PT Short Term Goals - 04/14/15 1116    PT SHORT TERM GOAL #1   Title be independent in initial HEP   Status Achieved   PT SHORT TERM GOAL #2   Title stand for 10 minutes to prepare an easy meal  without need to sit   Time 4   Period Weeks   Status Achieved   PT SHORT TERM GOAL #3   Title ambulate into and out of the clinic with or without device at least 50% of the time   Status Achieved   PT SHORT TERM GOAL #4   Title report a 25% reduction in LBP with standing for ADLs and self-care  Time 4   Period Weeks   Status Achieved  worst with standing at home, 1-2/10 in the clinic           PT Long Term Goals - 05/13/15 1129    PT LONG TERM GOAL #1   Title be independent in advanced HEP   Time 8   Period Weeks   Status On-going   PT LONG TERM GOAL #2   Title reduce FOTO to < or = to 51% limitation   Time 8   Period Weeks   Status On-going   PT LONG TERM GOAL #3   Title stand at home for cooking and ADLs for 15 minutes without need to rest   Time 8   Period Weeks   Status Achieved   PT LONG TERM GOAL #4   Title increase LE strength to ambulate in the community for > or = to 5-10 minutes without significant fatigue   Time 8   Period Weeks   Status On-going   PT LONG TERM GOAL #5   Title report a 50% reduction in LBP with standing for ADLs and self-care   Time 8   Period Weeks   Status On-going               Plan - 05/15/15 1500    Clinical Impression Statement PTA observed improved confidence with ambulation with SPC. Pt will continue to benfit from skilled PT to improve with strength, endurance, gait and pain managment.     Pt will benefit from skilled therapeutic intervention in order to improve on the following deficits Pain;Postural dysfunction;Decreased strength;Decreased mobility;Impaired flexibility;Improper body mechanics;Decreased activity tolerance;Decreased endurance;Difficulty walking   Rehab Potential Good   PT Frequency 2x / week   PT Duration 8 weeks   PT Next Visit Plan UE/LE strength and endurance, standing tolerance, postural strength, pain management as needed.     Consulted and Agree with Plan of Care Patient        Problem  List Patient Active Problem List   Diagnosis Date Noted  . ICD (implantable cardioverter-defibrillator) in place 11/28/2012  . Kidney disease, chronic, stage III (GFR 30-59 ml/min) 10/19/2012  . 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 Back pain, thoracic 02/21/2012  . Ischemic cardiomyopathy 01/26/2012  . Carcinoma in situ of vulva   . Fibroid   . Ovarian cyst, left   . Osteoporosis, idiopathic   . 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 GERD (gastroesophageal reflux disease)   . Encounter for long-term (current) use of anticoagulants 06/08/2010  . Mitral valve disease 04/16/2010  . VITAMIN D DEFICIENCY 11/05/2008  . VITAMIN B12 DEFICIENCY 10/22/2008  . Essential hypertension, benign 07/30/2008  . Celiac disease 08/09/2007  . HYPERLIPIDEMIA 07/21/2007  . Atrial fibrillation ([place]) 07/21/2007  . CHOLELITHIASIS 07/21/2007  . UNSPECIFIED IRON DEFICIENCY ANEMIA 06/06/2007    NAUMANN-HOUEGNIFIO,Maritza Hosterman PTA 05/15/2015, 3:25 PM  [facility] [address], [place], [place], [address] Phone: [phone]   Fax:  [phone]  Name: GEETHA HORNICK MRN: [id number] Date of Birth: [birth year]

## 2015-05-15 NOTE — Progress Notes (Signed)
Remote ICD transmission.   

## 2015-05-17 LAB — CUP PACEART REMOTE DEVICE CHECK
Battery Remaining Longevity: 70 mo
Battery Voltage: 2.99 V
Brady Statistic AP VP Percent: 1 %
Brady Statistic AS VP Percent: 1 %
Brady Statistic AS VS Percent: 56 %
Date Time Interrogation Session: 20170306070015
HighPow Impedance: 57 Ohm
HighPow Impedance: 57 Ohm
Implantable Lead Implant Date: 20140403
Implantable Lead Implant Date: 20140403
Implantable Lead Location: 753859
Implantable Lead Location: 753860
Lead Channel Pacing Threshold Amplitude: 0.75 V
Lead Channel Pacing Threshold Pulse Width: 0.5 ms
Lead Channel Sensing Intrinsic Amplitude: 12 mV
Lead Channel Setting Pacing Amplitude: 2 V
Lead Channel Setting Sensing Sensitivity: 0.5 mV
MDC IDC MSMT BATTERY REMAINING PERCENTAGE: 70 %
MDC IDC MSMT LEADCHNL RA IMPEDANCE VALUE: 430 Ohm
MDC IDC MSMT LEADCHNL RA SENSING INTR AMPL: 1.6 mV
MDC IDC MSMT LEADCHNL RV IMPEDANCE VALUE: 640 Ohm
MDC IDC MSMT LEADCHNL RV PACING THRESHOLD AMPLITUDE: 0.75 V
MDC IDC MSMT LEADCHNL RV PACING THRESHOLD PULSEWIDTH: 0.5 ms
MDC IDC SET LEADCHNL RV PACING AMPLITUDE: 2.5 V
MDC IDC SET LEADCHNL RV PACING PULSEWIDTH: 0.5 ms
MDC IDC STAT BRADY AP VS PERCENT: 44 %
MDC IDC STAT BRADY RA PERCENT PACED: 42 %
MDC IDC STAT BRADY RV PERCENT PACED: 1 %
Pulse Gen Serial Number: 7080516

## 2015-05-17 NOTE — Progress Notes (Signed)
Normal remote reviewed. Followed in [facility] clinic   Next merlin 08/11/15

## 2015-05-20 ENCOUNTER — Ambulatory Visit: Payer: Medicare Other | Admitting: Physical Therapy

## 2015-05-20 ENCOUNTER — Encounter: Payer: Self-pay | Admitting: Physical Therapy

## 2015-05-20 DIAGNOSIS — M810 Age-related osteoporosis without current pathological fracture: Secondary | ICD-10-CM | POA: Diagnosis not present

## 2015-05-20 DIAGNOSIS — M4850XA Collapsed vertebra, not elsewhere classified, site unspecified, initial encounter for fracture: Secondary | ICD-10-CM

## 2015-05-20 DIAGNOSIS — M545 Low back pain, unspecified: Secondary | ICD-10-CM

## 2015-05-20 DIAGNOSIS — R293 Abnormal posture: Secondary | ICD-10-CM | POA: Diagnosis not present

## 2015-05-20 DIAGNOSIS — R531 Weakness: Secondary | ICD-10-CM

## 2015-05-20 NOTE — Therapy (Signed)
[facility] [address], [place], [place], [address] Phone: [phone]   Fax:  [phone]  Physical Therapy Treatment  Patient Details  Name: [name] MRN: [id number] Date of Birth: Jul 29, 1940 Referring Provider: [name], MD  Encounter Date: 05/20/2015      PT End of Session - 05/20/15 1138    Visit Number 15   Number of Visits 20   Date for PT Re-Evaluation 05/27/15   Authorization Type KX at visit 15   PT Start Time 1100   PT Stop Time 1145   PT Time Calculation (min) 45 min   Equipment Utilized During Treatment Back brace   Activity Tolerance Patient tolerated treatment well   Behavior During Therapy [facility] for tasks assessed/performed      Past Medical History  Diagnosis Date  . Anemia   . Atrial fibrillation [facility])     A.fib/flutter: s/p MAZE 2008, DCCV 2011, on coumadin; previously on flecainide, but dc'd due to worsening EF. Tikosyn discontinued 06/2012 after cardiac arrest.  . Diverticulitis   . CVA (cerebral vascular accident) ([place])     2008 - felt due to oscillating calcium on aortic valve  . Carcinoma in situ of vulva   . Fibroid   . Ovarian cyst, left 2008-2009  . Osteoporosis   . Diabetes mellitus   . Hypertension   . GERD (gastroesophageal reflux disease)   . Chronic systolic CHF (congestive heart failure) (HCC)     EF 35%  . Ischemic cardiomyopathy     EF 35%  . Coronary artery disease     Occluded LM 2/2 previous aortic root surgery; s/p 2v CABG 2010 LIMA to LAD, SVG to OM; Cath 03/11/12 patent SVG to OM, atretic LIMA to LAD, patent RCA, occluded native LM, EF 35%   . Arthritis     OSTEO  . Fracture of lumbar spine ([place])   . Ventricular fibrillation ([place])     VF arrest/VT/torsades 06/2012 with prolonged hosp with VDRF, cardiogen shock, asp PNA, encephalopathy, anemia, shock liver, hypernatremia - s/p ICD implantation 06/08/12.  . Superficial venous thrombosis of upper extremity      06/2012 - LUE  . Severe aortic stenosis     s/p homograft aortic root replacement 2008  . Mitral regurgitation     s/p mitral valve repair 2008  . Thoracic aortic aneurysm [facility])     s/p resection and grafting 2008  . Stroke ([place])   . Kidney stones   . Dysrhythmia   . AICD (automatic cardioverter/defibrillator) present     2014    Past Surgical History  Procedure Laterality Date  . Foot surgery Left     removed neuroma  . Aortic root replacement  2008    homograft  . Mitral valve replacement  2008    due to severe MR  . Resection and grafting of ascending thoracic aortic    . Aneurysm and left sided maze  2008  . Coronary artery bypass graft  4/10    LIMA to LAD, SVG to OM  . Wide excision of vulva      CA INSITU  . Cardiac defibrillator placement  06/08/2012    [facility] [place] DR  implanted by Dr Rayann Heman following a cardiac arrest  . Right heart catheterization N/A 04/03/2012    Procedure: RIGHT HEART CATH;  Surgeon: [name], MD;  Location: [facility] CATH LAB;  Service: Cardiovascular;  Laterality: N/A;  . Implantable cardioverter defibrillator implant  N/A 06/08/2012    Procedure: IMPLANTABLE CARDIOVERTER DEFIBRILLATOR IMPLANT;  Surgeon: Thompson Grayer, MD;  Location: [facility] CATH LAB;  Service: Cardiovascular;  Laterality: N/A;  . Cataract extraction    . Eye surgery    . Kyphoplasty N/A 12/11/2014    Procedure: KYPHOPLASTY T-12;  Surgeon: Karie Chimera, MD;  Location: [place] NEURO ORS;  Service: Neurosurgery;  Laterality: N/A;  KYPHOPLASTY T-12    There were no vitals filed for this visit.  Visit Diagnosis:  Osteoporosis  Bilateral low back pain without sciatica  Compression fracture of spine, non-traumatic, initial encounter (HCC)  Posture abnormality  Weakness      Subjective Assessment - 05/20/15 1121    Subjective Pt reports her sitting tolerance improved from 10 min to 30 to 40 min now, she was able to go grocery shopping last Thursday for one hour  usiung her walker. Pt has no complain of pain except she is standing to long.    Pertinent History Osteoporosis, Pacemaker/defibrillator, Kyphoplasty (12/2014)   Limitations Standing;Walking   How long can you stand comfortably? 10 minutes max   How long can you walk comfortably? only around the house up to 5 minutes   Diagnostic tests x-ray, CT scan   Currently in Pain? No/denies                         [facility] Adult PT Treatment/Exercise - 05/20/15 0001    Knee/Hip Exercises: Aerobic   Nustep Level 3 x 10 minutes, seat 8, arms 9   Knee/Hip Exercises: Standing   Heel Raises Both;20 reps   Hip Abduction Stengthening;2 sets;10 reps   Abduction Limitations 2.5# added   Hip Extension Stengthening;Both;2 sets;10 reps   Extension Limitations 2.5#    Forward Step Up Both;2 sets;15 reps;Hand Hold: 2;Step Height: 6"   Knee/Hip Exercises: Seated   Long Arc Quad Both;2 sets;10 reps;Strengthening;Weights   Long Arc Quad Limitations 2.5#   Marching Strengthening;Both;2 sets;10 reps;Weights   Film/video editor --  2.5#   Shoulder Exercises: Seated   Horizontal ABduction Strengthening;20 reps;Theraband  2 x 10    Other Seated Exercises D2 with yellow t-band in sitting 2 x 10                PT Education - 05/20/15 1132    Education provided Yes   Education Details sitting bil horizontal abduction and D2 with yellow t-band   Person(s) Educated Patient   Methods Explanation;Demonstration;Handout   Comprehension Verbalized understanding;Returned demonstration          PT Short Term Goals - 04/14/15 1116    PT SHORT TERM GOAL #1   Title be independent in initial HEP   Status Achieved   PT SHORT TERM GOAL #2   Title stand for 10 minutes to prepare an easy meal without need to sit   Time 4   Period Weeks   Status Achieved   PT SHORT TERM GOAL #3   Title ambulate into and out of the clinic with or without device at least 50% of the time   Status Achieved   PT  SHORT TERM GOAL #4   Title report a 25% reduction in LBP with standing for ADLs and self-care   Time 4   Period Weeks   Status Achieved  worst with standing at home, 1-2/10 in the clinic           PT Long Term Goals - 05/20/15 1146    PT LONG TERM  GOAL #1   Title be independent in advanced HEP   Time 8   Period Weeks   Status On-going   PT LONG TERM GOAL #2   Title reduce FOTO to < or = to 51% limitation   Time 8   Period Weeks   Status On-going   PT LONG TERM GOAL #3   Title stand at home for cooking and ADLs for 15 minutes without need to rest   Time 8   Period Weeks   Status Achieved   PT LONG TERM GOAL #4   Title increase LE strength to ambulate in the community for > or = to 5-10 minutes without significant fatigue   Time 8   Period Weeks   Status Achieved   PT LONG TERM GOAL #5   Title report a 50% reduction in LBP with standing for ADLs and self-care   Time 8   Period Weeks   Status Achieved               Plan - 05/20/15 1142    Clinical Impression Statement Pt with improved confidence with ambulation and incr distance with SPC. Pt will be D/C next visit.    Pt will benefit from skilled therapeutic intervention in order to improve on the following deficits Pain;Postural dysfunction;Decreased strength;Decreased mobility;Impaired flexibility;Improper body mechanics;Decreased activity tolerance;Decreased endurance;Difficulty walking   Rehab Potential Good   PT Frequency 2x / week   PT Duration 8 weeks   PT Treatment/Interventions ADLs/Self Care Home Management;Cryotherapy;Moist Heat;Ultrasound;Gait training;Stair training;Functional mobility training;Therapeutic activities;Therapeutic exercise;Manual techniques;Patient/family education;Neuromuscular re-education;Passive range of motion   PT Next Visit Plan Foto D/C to HEP   Consulted and Agree with Plan of Care Patient        Problem List Patient Active Problem List   Diagnosis Date Noted  . ICD  (implantable cardioverter-defibrillator) in place 11/28/2012  . Kidney disease, chronic, stage III (GFR 30-59 ml/min) 10/19/2012  . 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 Back pain, thoracic 02/21/2012  . Ischemic cardiomyopathy 01/26/2012  . Carcinoma in situ of vulva   . Fibroid   . Ovarian cyst, left   . Osteoporosis, idiopathic   . 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 GERD (gastroesophageal reflux disease)   . Encounter for long-term (current) use of anticoagulants 06/08/2010  . Mitral valve disease 04/16/2010  . VITAMIN D DEFICIENCY 11/05/2008  . VITAMIN B12 DEFICIENCY 10/22/2008  . Essential hypertension, benign 07/30/2008  . Celiac disease 08/09/2007  . HYPERLIPIDEMIA 07/21/2007  . Atrial fibrillation ([place]) 07/21/2007  . CHOLELITHIASIS 07/21/2007  . UNSPECIFIED IRON DEFICIENCY ANEMIA 06/06/2007    NAUMANN-HOUEGNIFIO,Mattheus Rauls PTA 05/20/2015, 12:09 PM  [facility] [address], [place], [place], [address] Phone: [phone]   Fax:  [phone]  Name: [name] MRN: [id number] Date of Birth: 1941-01-01

## 2015-05-20 NOTE — Patient Instructions (Signed)
PNF Strengthening: Resisted   Standing with resistive band around each hand, bring right arm up and away, thumb back. Do both sides. Repeat 10____ times per set. Do _1-3___ sets per session. Do ___1_ sessions per day.  http://orth.exer.us/918   Copyright  VHI. All rights reserved.  Strengthening: Chest Pull - Resisted   With resistive band looped around each hand, and arms straight out in front, stretch band across chest. Repeat __10__ times per set. Do 1-3____ sets per session. Do _1___ sessions per day.  http://orth.exer.us/926   Copyright  VHI. All rights reserved.   ALSO PULL BAND APART AND THEN RAISE OVERHEAD FROM WAIST. SAME REPS.

## 2015-05-21 ENCOUNTER — Encounter: Payer: Self-pay | Admitting: Cardiology

## 2015-05-22 ENCOUNTER — Ambulatory Visit: Payer: Medicare Other

## 2015-05-22 DIAGNOSIS — R293 Abnormal posture: Secondary | ICD-10-CM

## 2015-05-22 DIAGNOSIS — M4850XA Collapsed vertebra, not elsewhere classified, site unspecified, initial encounter for fracture: Secondary | ICD-10-CM | POA: Diagnosis not present

## 2015-05-22 DIAGNOSIS — R531 Weakness: Secondary | ICD-10-CM

## 2015-05-22 DIAGNOSIS — M810 Age-related osteoporosis without current pathological fracture: Secondary | ICD-10-CM | POA: Diagnosis not present

## 2015-05-22 DIAGNOSIS — M545 Low back pain, unspecified: Secondary | ICD-10-CM

## 2015-05-22 NOTE — Therapy (Signed)
[facility] [address] [place], [place], [address] Phone: [phone]   Fax:  [phone]  Physical Therapy Treatment  Patient Details  Name: [name] MRN: [id number] Date of Birth: March 09, 1940 Referring Provider: [name], MD  Encounter Date: 05/22/2015      PT End of Session - 05/22/15 1139    Visit Number 16   PT Start Time 1059   PT Stop Time 1139   PT Time Calculation (min) 40 min   Equipment Utilized During Treatment Back brace   Activity Tolerance Patient tolerated treatment well   Behavior During Therapy [facility] for tasks assessed/performed      Past Medical History  Diagnosis Date  . Anemia   . Atrial fibrillation [facility])     A.fib/flutter: s/p MAZE 2008, DCCV 2011, on coumadin; previously on flecainide, but dc'd due to worsening EF. Tikosyn discontinued 06/2012 after cardiac arrest.  . Diverticulitis   . CVA (cerebral vascular accident) ([place])     2008 - felt due to oscillating calcium on aortic valve  . Carcinoma in situ of vulva   . Fibroid   . Ovarian cyst, left 2008-2009  . Osteoporosis   . Diabetes mellitus   . Hypertension   . GERD (gastroesophageal reflux disease)   . Chronic systolic CHF (congestive heart failure) (HCC)     EF 35%  . Ischemic cardiomyopathy     EF 35%  . Coronary artery disease     Occluded LM 2/2 previous aortic root surgery; s/p 2v CABG 2010 LIMA to LAD, SVG to OM; Cath 03/11/12 patent SVG to OM, atretic LIMA to LAD, patent RCA, occluded native LM, EF 35%   . Arthritis     OSTEO  . Fracture of lumbar spine ([place])   . Ventricular fibrillation ([place])     VF arrest/VT/torsades 06/2012 with prolonged hosp with VDRF, cardiogen shock, asp PNA, encephalopathy, anemia, shock liver, hypernatremia - s/p ICD implantation 06/08/12.  . Superficial venous thrombosis of upper extremity     06/2012 - LUE  . Severe aortic stenosis     s/p homograft aortic root replacement 2008  .  Mitral regurgitation     s/p mitral valve repair 2008  . Thoracic aortic aneurysm [facility])     s/p resection and grafting 2008  . Stroke ([place])   . Kidney stones   . Dysrhythmia   . AICD (automatic cardioverter/defibrillator) present     2014    Past Surgical History  Procedure Laterality Date  . Foot surgery Left     removed neuroma  . Aortic root replacement  2008    homograft  . Mitral valve replacement  2008    due to severe MR  . Resection and grafting of ascending thoracic aortic    . Aneurysm and left sided maze  2008  . Coronary artery bypass graft  4/10    LIMA to LAD, SVG to OM  . Wide excision of vulva      CA INSITU  . Cardiac defibrillator placement  06/08/2012    [facility] [place] DR  implanted by Dr Rayann Heman following a cardiac arrest  . Right heart catheterization N/A 04/03/2012    Procedure: RIGHT HEART CATH;  Surgeon: [name], MD;  Location: [facility] CATH LAB;  Service: Cardiovascular;  Laterality: N/A;  . Implantable cardioverter defibrillator implant N/A 06/08/2012    Procedure: IMPLANTABLE CARDIOVERTER DEFIBRILLATOR IMPLANT;  Surgeon: Thompson Grayer, MD;  Location: [facility] CATH LAB;  Service: Cardiovascular;  Laterality: N/A;  . Cataract extraction    . Eye surgery    . Kyphoplasty N/A 12/11/2014    Procedure: KYPHOPLASTY T-12;  Surgeon: Karie Chimera, MD;  Location: [facility] NEURO ORS;  Service: Neurosurgery;  Laterality: N/A;  KYPHOPLASTY T-12    There were no vitals filed for this visit.  Visit Diagnosis:  Osteoporosis  Bilateral low back pain without sciatica  Posture abnormality  Weakness      Subjective Assessment - 05/22/15 1105    Subjective Pt is ready for D/C   Currently in Pain? No/denies            [facility] PT Assessment - 05/22/15 0001    Assessment   Medical Diagnosis T12 compression fracture, subaccute T11 & L1 compression fracture, osteoporosis   Observation/Other Assessments   Focus on Therapeutic Outcomes (FOTO)  51% limitation                      [facility] Adult PT Treatment/Exercise - 05/22/15 0001    Knee/Hip Exercises: Aerobic   Nustep Level 3 x 10 minutes, seat 8, arms 9   Knee/Hip Exercises: Standing   Heel Raises Both;20 reps   Hip Abduction Stengthening;2 sets;10 reps   Abduction Limitations 2.5# added   Hip Extension Stengthening;Both;2 sets;10 reps   Extension Limitations 2.5#    Forward Step Up Both;2 sets;15 reps;Hand Hold: 2;Step Height: 6"   Rebounder weight shifting 3 ways x 1 minute each   Knee/Hip Exercises: Seated   Long Arc Quad Both;2 sets;10 reps;Strengthening;Weights   Long Arc Quad Limitations 2.5#   Marching Strengthening;Both;2 sets;10 reps;Weights   Film/video editor --  2.5#   Shoulder Exercises: Seated   Horizontal ABduction Strengthening;20 reps;Theraband  2 x 10    Other Seated Exercises D2 with yellow t-band in sitting 2 x 10                PT Education - 05/22/15 1130    Education provided Yes   Education Details discussed adjustable ankle weights for home use   Person(s) Educated Patient   Methods Explanation   Comprehension Verbalized understanding          PT Short Term Goals - 04/14/15 1116    PT SHORT TERM GOAL #1   Title be independent in initial HEP   Status Achieved   PT [place] #2   Title stand for 10 minutes to prepare an easy meal without need to sit   Time 4   Period Weeks   Status Achieved   PT SHORT TERM GOAL #3   Title ambulate into and out of the clinic with or without device at least 50% of the time   Status Achieved   PT SHORT TERM GOAL #4   Title report a 25% reduction in LBP with standing for ADLs and self-care   Time 4   Period Weeks   Status Achieved  worst with standing at home, 1-2/10 in the clinic           PT Long Term Goals - 05/22/15 1053    PT [place] #1   Title be independent in advanced HEP   Status Achieved   PT LONG TERM GOAL #2   Title reduce FOTO to < or = to 51% limitation    Status Achieved  51% today   PT LONG TERM GOAL #3   Title stand at home for cooking and ADLs for 15 minutes without  need to rest   Status Achieved   PT LONG TERM GOAL #4   Title increase LE strength to ambulate in the community for > or = to 5-10 minutes without significant fatigue   Status Achieved   PT LONG TERM GOAL #5   Title report a 50% reduction in LBP with standing for ADLs and self-care   Status Achieved               Plan - 04-Jun-2015 1105    Clinical Impression Statement Pt is ready for D/C to HEP. Pt is able to standing and walk for community function without significant limitations.  Pt denies any lumbar pain at this time.  FOTO is 51% limitation.  Pt is able to stand for 25 minutes with housework and walk for 1 hour in the community with use of a cart.  Pt will D/C and follow-up with MD as needed.     PT Next Visit Plan D/C PT to HEP   Consulted and Agree with Plan of Care Patient          G-Codes - June 04, 2015 1104    Functional Assessment Tool Used FOTO: 51% limitation   Functional Limitation Other PT primary   Other PT Primary Goal Status (F7903) At least 40 percent but less than 60 percent impaired, limited or restricted   Other PT Primary Discharge Status (Y3338) At least 40 percent but less than 60 percent impaired, limited or restricted      Problem List Patient Active Problem List   Diagnosis Date Noted  . ICD (implantable cardioverter-defibrillator) in place 11/28/2012  . Kidney disease, chronic, stage III (GFR 30-59 ml/min) 10/19/2012  . 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 Back pain, thoracic 02/21/2012  . Ischemic cardiomyopathy 01/26/2012  . Carcinoma in situ of vulva   . Fibroid   . Ovarian cyst, left   . Osteoporosis, idiopathic   . 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 GERD (gastroesophageal reflux disease)   . Encounter for  long-term (current) use of anticoagulants 06/08/2010  . Mitral valve disease 04/16/2010  . VITAMIN D DEFICIENCY 11/05/2008  . VITAMIN B12 DEFICIENCY 10/22/2008  . Essential hypertension, benign 07/30/2008  . Celiac disease 08/09/2007  . HYPERLIPIDEMIA 07/21/2007  . Atrial fibrillation ([place]) 07/21/2007  . CHOLELITHIASIS 07/21/2007  . UNSPECIFIED IRON DEFICIENCY ANEMIA 06/06/2007   PHYSICAL THERAPY DISCHARGE SUMMARY  Visits from Start of Care: 16  Current functional level related to goals / functional outcomes: See above for current status.   Remaining deficits: Pt with postural abnormality due to osteoporosis.  Pt with limited endurance and has HEP in place for continued gains.     Education / Equipment: Osteoporosis information, HEP, body mechanics Plan: Patient agrees to discharge.  Patient goals were met. Patient is being discharged due to meeting the stated rehab goals.  ?????    Sigurd Sos, PT 04-Jun-2015 11:43 AM  [facility] [address] [place], [place], [address] Phone: [phone]   Fax:  [phone]  Name: CECELIA GRACIANO MRN: [id number] Date of Birth: 30-Apr-1940

## 2015-05-26 DIAGNOSIS — Z1231 Encounter for screening mammogram for malignant neoplasm of breast: Secondary | ICD-10-CM | POA: Diagnosis not present

## 2015-05-26 DIAGNOSIS — Z803 Family history of malignant neoplasm of breast: Secondary | ICD-10-CM | POA: Diagnosis not present

## 2015-06-04 ENCOUNTER — Encounter: Payer: Self-pay | Admitting: Cardiology

## 2015-06-09 ENCOUNTER — Ambulatory Visit (INDEPENDENT_AMBULATORY_CARE_PROVIDER_SITE_OTHER): Payer: Medicare Other | Admitting: *Deleted

## 2015-06-09 DIAGNOSIS — I4891 Unspecified atrial fibrillation: Secondary | ICD-10-CM

## 2015-06-09 DIAGNOSIS — I059 Rheumatic mitral valve disease, unspecified: Secondary | ICD-10-CM

## 2015-06-09 DIAGNOSIS — I359 Nonrheumatic aortic valve disorder, unspecified: Secondary | ICD-10-CM

## 2015-06-09 DIAGNOSIS — Z7901 Long term (current) use of anticoagulants: Secondary | ICD-10-CM

## 2015-06-09 LAB — POCT INR: INR: 1.5

## 2015-06-16 ENCOUNTER — Ambulatory Visit (INDEPENDENT_AMBULATORY_CARE_PROVIDER_SITE_OTHER): Payer: Medicare Other

## 2015-06-16 ENCOUNTER — Ambulatory Visit (INDEPENDENT_AMBULATORY_CARE_PROVIDER_SITE_OTHER): Payer: Medicare Other | Admitting: *Deleted

## 2015-06-16 DIAGNOSIS — I255 Ischemic cardiomyopathy: Secondary | ICD-10-CM | POA: Diagnosis not present

## 2015-06-16 DIAGNOSIS — I4891 Unspecified atrial fibrillation: Secondary | ICD-10-CM

## 2015-06-16 DIAGNOSIS — Z95810 Presence of automatic (implantable) cardiac defibrillator: Secondary | ICD-10-CM | POA: Diagnosis not present

## 2015-06-16 DIAGNOSIS — I359 Nonrheumatic aortic valve disorder, unspecified: Secondary | ICD-10-CM | POA: Diagnosis not present

## 2015-06-16 DIAGNOSIS — I059 Rheumatic mitral valve disease, unspecified: Secondary | ICD-10-CM | POA: Diagnosis not present

## 2015-06-16 DIAGNOSIS — Z7901 Long term (current) use of anticoagulants: Secondary | ICD-10-CM

## 2015-06-16 LAB — POCT INR: INR: 1.8

## 2015-06-16 NOTE — Progress Notes (Signed)
EPIC Encounter for ICM Monitoring  Patient Name: [name] is a 75 y.o. female Date: 06/16/2015 Primary Care Physican: Velna Hatchet, MD Primary Cardiologist: Stanford Breed Electrophysiologist: Allred Dry Weight: Does not weigh   In the past month, have you:  1. Gained more than 2 pounds in a day or more than 5 pounds in a week? no  2. Had changes in your medications (with verification of current medications)? no  3. Had more shortness of breath than is usual for you? no  4. Limited your activity because of shortness of breath? no  5. Not been able to sleep because of shortness of breath? no  6. Had increased swelling in your feet or ankles? Yes, slight swelling in ankles but now resolved.   7. Had symptoms of dehydration (dizziness, dry mouth, increased thirst, decreased urine output) no  8. Had changes in sodium restriction? no  9. Been compliant with medication? Yes   ICM trend: 3 month view for 06/16/2015   ICM trend: 1 year view for 06/16/2015   Follow-up plan: ICM clinic phone appointment on 07/17/2015.  Thoracic impedance below reference line from 05/18/2015 to 06/02/2015 and 06/08/2015 to 06/15/2015 suggesting fluid accumulation and returned to reference line on 06/16/2015.  Patient had symptoms of slight ankle swelling which has resolved.  Education given to limit sodium intake to < 2000 mg and fluid intake to 64 oz daily.  Encouraged to call for any fluid symptoms.  No changes today.     Rosalene Billings, RN, CCM 06/16/2015 11:25 AM

## 2015-06-16 NOTE — Progress Notes (Signed)
Remote ICD transmission.   

## 2015-06-30 ENCOUNTER — Ambulatory Visit (INDEPENDENT_AMBULATORY_CARE_PROVIDER_SITE_OTHER): Payer: Medicare Other | Admitting: *Deleted

## 2015-06-30 DIAGNOSIS — Z7901 Long term (current) use of anticoagulants: Secondary | ICD-10-CM

## 2015-06-30 DIAGNOSIS — I4891 Unspecified atrial fibrillation: Secondary | ICD-10-CM | POA: Diagnosis not present

## 2015-06-30 DIAGNOSIS — I359 Nonrheumatic aortic valve disorder, unspecified: Secondary | ICD-10-CM | POA: Diagnosis not present

## 2015-06-30 DIAGNOSIS — I059 Rheumatic mitral valve disease, unspecified: Secondary | ICD-10-CM | POA: Diagnosis not present

## 2015-06-30 LAB — POCT INR: INR: 2

## 2015-07-01 ENCOUNTER — Other Ambulatory Visit: Payer: Self-pay | Admitting: Internal Medicine

## 2015-07-09 ENCOUNTER — Other Ambulatory Visit: Payer: Self-pay | Admitting: *Deleted

## 2015-07-09 MED ORDER — FUROSEMIDE 20 MG PO TABS: 20.0000 mg | ORAL_TABLET | Freq: Every day | ORAL | Status: DC

## 2015-07-09 NOTE — Telephone Encounter (Signed)
Rx refill

## 2015-07-11 ENCOUNTER — Telehealth: Payer: Self-pay

## 2015-07-11 NOTE — Telephone Encounter (Signed)
Returned patients call.  She stated she will be out of town, 07/17/2015, on the day the ICM remote is scheduled.  ICM remote transmission rescheduled for 07/23/2015.  She stated that would be fine.

## 2015-07-14 ENCOUNTER — Ambulatory Visit (INDEPENDENT_AMBULATORY_CARE_PROVIDER_SITE_OTHER): Payer: Medicare Other | Admitting: *Deleted

## 2015-07-14 DIAGNOSIS — Z7901 Long term (current) use of anticoagulants: Secondary | ICD-10-CM

## 2015-07-14 DIAGNOSIS — I4891 Unspecified atrial fibrillation: Secondary | ICD-10-CM

## 2015-07-14 DIAGNOSIS — I059 Rheumatic mitral valve disease, unspecified: Secondary | ICD-10-CM

## 2015-07-14 DIAGNOSIS — I359 Nonrheumatic aortic valve disorder, unspecified: Secondary | ICD-10-CM

## 2015-07-14 LAB — POCT INR: INR: 2.5

## 2015-07-22 ENCOUNTER — Encounter: Payer: Self-pay | Admitting: Cardiology

## 2015-07-22 LAB — CUP PACEART REMOTE DEVICE CHECK
Battery Voltage: 2.99 V
Brady Statistic AP VP Percent: 1 %
Brady Statistic AP VS Percent: 41 %
Brady Statistic AS VP Percent: 1 %
Brady Statistic AS VS Percent: 59 %
Brady Statistic RV Percent Paced: 1 %
Date Time Interrogation Session: 20170410060018
HIGH POWER IMPEDANCE MEASURED VALUE: 55 Ohm
HighPow Impedance: 55 Ohm
Implantable Lead Implant Date: 20140403
Implantable Lead Location: 753859
Lead Channel Sensing Intrinsic Amplitude: 12 mV
Lead Channel Setting Pacing Amplitude: 2.5 V
Lead Channel Setting Pacing Pulse Width: 0.5 ms
Lead Channel Setting Sensing Sensitivity: 0.5 mV
MDC IDC LEAD IMPLANT DT: 20140403
MDC IDC LEAD LOCATION: 753860
MDC IDC MSMT BATTERY REMAINING LONGEVITY: 67 mo
MDC IDC MSMT BATTERY REMAINING PERCENTAGE: 69 %
MDC IDC MSMT LEADCHNL RA IMPEDANCE VALUE: 430 Ohm
MDC IDC MSMT LEADCHNL RA SENSING INTR AMPL: 1.2 mV
MDC IDC MSMT LEADCHNL RV IMPEDANCE VALUE: 640 Ohm
MDC IDC SET LEADCHNL RA PACING AMPLITUDE: 2 V
MDC IDC STAT BRADY RA PERCENT PACED: 39 %
Pulse Gen Serial Number: 7080516

## 2015-07-23 ENCOUNTER — Ambulatory Visit (INDEPENDENT_AMBULATORY_CARE_PROVIDER_SITE_OTHER): Payer: Medicare Other

## 2015-07-23 DIAGNOSIS — Z95810 Presence of automatic (implantable) cardiac defibrillator: Secondary | ICD-10-CM

## 2015-07-23 DIAGNOSIS — I255 Ischemic cardiomyopathy: Secondary | ICD-10-CM

## 2015-07-24 DIAGNOSIS — L821 Other seborrheic keratosis: Secondary | ICD-10-CM | POA: Diagnosis not present

## 2015-07-24 DIAGNOSIS — D225 Melanocytic nevi of trunk: Secondary | ICD-10-CM | POA: Diagnosis not present

## 2015-07-24 DIAGNOSIS — L304 Erythema intertrigo: Secondary | ICD-10-CM | POA: Diagnosis not present

## 2015-07-24 DIAGNOSIS — Z85828 Personal history of other malignant neoplasm of skin: Secondary | ICD-10-CM | POA: Diagnosis not present

## 2015-07-24 DIAGNOSIS — D485 Neoplasm of uncertain behavior of skin: Secondary | ICD-10-CM | POA: Diagnosis not present

## 2015-07-24 NOTE — Progress Notes (Signed)
EPIC Encounter for ICM Monitoring  Patient Name: SAMIRRA MOLINAR is a 75 y.o. female Date: 07/24/2015 Primary Care Physican: Velna Hatchet, MD Primary Cardiologist: Stanford Breed Electrophysiologist: Allred Dry Weight: unknown   In the past month, have you:  1. Gained more than 2 pounds in a day or more than 5 pounds in a week? N/A  2. Had changes in your medications (with verification of current medications)? N/A  3. Had more shortness of breath than is usual for you? N/A  4. Limited your activity because of shortness of breath? N/A  5. Not been able to sleep because of shortness of breath? N/A  6. Had increased swelling in your feet or ankles? N/A  7. Had symptoms of dehydration (dizziness, dry mouth, increased thirst, decreased urine output) N/A  8. Had changes in sodium restriction? N/A  9. Been compliant with medication? N/A   ICM trend: 3 month view for 07/23/2015   ICM trend: 1 year view for 07/23/2015   Follow-up plan: ICM clinic phone appointment on 08/26/2015.  Attempted call to patient and unable to reach.  Transmission reviewed.  Thoracic impedance below reference line from 07/21/2015 to 07/22/2015 suggesting fluid accumulation. Returned to baseline 07/23/2015 and was at baseline prior to 07/21/2015.     Rosalene Billings, RN, CCM 07/24/2015 2:06 PM

## 2015-07-24 NOTE — Progress Notes (Signed)
Received call back from patient.  She reported she is doing well and denied any fluid symptoms.  Reviewed transmission and fluid levels appear stable.   Discussed the sodium presentation at [facility] support group meeting.  She stated she only adds salt to fresh corn on cob, tomatoes, rice, eggs.  She stated she uses low sodium soups during cooking.  Advised 1 teaspoon salt = 2300 mg and the recommended daily intake is 2000 mg which would be less than a tsp.  Encouraged her to call for any symptoms.  No changes in meds.  Next transmission 08/26/2015.

## 2015-07-30 DIAGNOSIS — E559 Vitamin D deficiency, unspecified: Secondary | ICD-10-CM | POA: Diagnosis not present

## 2015-07-30 DIAGNOSIS — M810 Age-related osteoporosis without current pathological fracture: Secondary | ICD-10-CM | POA: Diagnosis not present

## 2015-07-30 DIAGNOSIS — M545 Low back pain: Secondary | ICD-10-CM | POA: Diagnosis not present

## 2015-07-30 DIAGNOSIS — M25551 Pain in right hip: Secondary | ICD-10-CM | POA: Diagnosis not present

## 2015-08-05 ENCOUNTER — Ambulatory Visit (INDEPENDENT_AMBULATORY_CARE_PROVIDER_SITE_OTHER): Payer: Medicare Other

## 2015-08-05 ENCOUNTER — Encounter: Payer: Self-pay | Admitting: Cardiology

## 2015-08-05 DIAGNOSIS — I4891 Unspecified atrial fibrillation: Secondary | ICD-10-CM

## 2015-08-05 DIAGNOSIS — Z7901 Long term (current) use of anticoagulants: Secondary | ICD-10-CM | POA: Diagnosis not present

## 2015-08-05 DIAGNOSIS — I059 Rheumatic mitral valve disease, unspecified: Secondary | ICD-10-CM

## 2015-08-05 DIAGNOSIS — I359 Nonrheumatic aortic valve disorder, unspecified: Secondary | ICD-10-CM | POA: Diagnosis not present

## 2015-08-05 LAB — POCT INR: INR: 2.1

## 2015-08-06 DIAGNOSIS — I25728 Atherosclerosis of autologous artery coronary artery bypass graft(s) with other forms of angina pectoris: Secondary | ICD-10-CM | POA: Diagnosis not present

## 2015-08-06 DIAGNOSIS — M810 Age-related osteoporosis without current pathological fracture: Secondary | ICD-10-CM | POA: Diagnosis not present

## 2015-08-06 DIAGNOSIS — I10 Essential (primary) hypertension: Secondary | ICD-10-CM | POA: Diagnosis not present

## 2015-08-06 DIAGNOSIS — E559 Vitamin D deficiency, unspecified: Secondary | ICD-10-CM | POA: Diagnosis not present

## 2015-08-06 DIAGNOSIS — E119 Type 2 diabetes mellitus without complications: Secondary | ICD-10-CM | POA: Diagnosis not present

## 2015-08-06 DIAGNOSIS — I5022 Chronic systolic (congestive) heart failure: Secondary | ICD-10-CM | POA: Diagnosis not present

## 2015-08-06 DIAGNOSIS — R5383 Other fatigue: Secondary | ICD-10-CM | POA: Diagnosis not present

## 2015-08-06 DIAGNOSIS — E11638 Type 2 diabetes mellitus with other oral complications: Secondary | ICD-10-CM | POA: Diagnosis not present

## 2015-08-06 DIAGNOSIS — Z6826 Body mass index (BMI) 26.0-26.9, adult: Secondary | ICD-10-CM | POA: Diagnosis not present

## 2015-08-25 ENCOUNTER — Other Ambulatory Visit: Payer: Self-pay | Admitting: Cardiology

## 2015-08-26 ENCOUNTER — Ambulatory Visit (INDEPENDENT_AMBULATORY_CARE_PROVIDER_SITE_OTHER): Payer: Medicare Other

## 2015-08-26 DIAGNOSIS — Z95810 Presence of automatic (implantable) cardiac defibrillator: Secondary | ICD-10-CM | POA: Diagnosis not present

## 2015-08-26 DIAGNOSIS — I255 Ischemic cardiomyopathy: Secondary | ICD-10-CM

## 2015-08-26 NOTE — Progress Notes (Signed)
EPIC Encounter for ICM Monitoring  Patient Name: [name] is a 75 y.o. female Date: 08/26/2015 Primary Care Physican: Velna Hatchet, MD Primary Cardiologist: Stanford Breed Electrophysiologist: Allred Dry Weight: unknown        In the past month, have you:  1. Gained more than 2 pounds in a day or more than 5 pounds in a week? unknown  2. Had changes in your medications (with verification of current medications)? No  3. Had more shortness of breath than is usual for you? No   4. Limited your activity because of shortness of breath? No   5. Not been able to sleep because of shortness of breath? No   6. Had increased swelling in your feet, ankles, legs or stomach area? No   7. Had symptoms of dehydration (dizziness, dry mouth, increased thirst, decreased urine output) No   8. Had changes in sodium restriction? No   9. Been compliant with medication? Yes   ICM trend: 3 month view for 08/26/2015    ICM trend: 1 year view for 08/26/2015   Follow-up plan: ICM clinic phone appointment 09/26/2015.   FLUID LEVELS: [place] thoracic impedance decreased a few days in June but returned to baseline suggesting stable fluid levels.     SYMPTOMS: None, denied any fluid symptoms such as weight gain of 3 pounds overnight or 5 pounds within a week, SOB and/or lower extremity swelling.   She stated she is feeling good today.  Encouraged to call for any fluid symptoms.   EDUCATION: Limit sodium intake to < 2000 mg and fluid intake to 64 oz daily.     RECOMMENDATIONS: No changes today.    Rosalene Billings, RN, CCM 08/26/2015 3:38 PM

## 2015-09-01 ENCOUNTER — Ambulatory Visit (INDEPENDENT_AMBULATORY_CARE_PROVIDER_SITE_OTHER): Payer: Medicare Other | Admitting: *Deleted

## 2015-09-01 DIAGNOSIS — I4891 Unspecified atrial fibrillation: Secondary | ICD-10-CM | POA: Diagnosis not present

## 2015-09-01 DIAGNOSIS — I059 Rheumatic mitral valve disease, unspecified: Secondary | ICD-10-CM | POA: Diagnosis not present

## 2015-09-01 DIAGNOSIS — Z7901 Long term (current) use of anticoagulants: Secondary | ICD-10-CM | POA: Diagnosis not present

## 2015-09-01 DIAGNOSIS — I359 Nonrheumatic aortic valve disorder, unspecified: Secondary | ICD-10-CM

## 2015-09-01 LAB — POCT INR: INR: 2.8

## 2015-09-22 ENCOUNTER — Telehealth: Payer: Self-pay

## 2015-09-22 NOTE — Telephone Encounter (Signed)
Received call from patient.  She requested to reschedule ICM remote because she will be out of town on 09/26/2015.  Remote transmission rescheduled 09/29/2015.

## 2015-09-23 DIAGNOSIS — N200 Calculus of kidney: Secondary | ICD-10-CM | POA: Diagnosis not present

## 2015-09-23 DIAGNOSIS — R3121 Asymptomatic microscopic hematuria: Secondary | ICD-10-CM | POA: Diagnosis not present

## 2015-09-28 ENCOUNTER — Other Ambulatory Visit: Payer: Self-pay | Admitting: Internal Medicine

## 2015-09-29 ENCOUNTER — Ambulatory Visit (INDEPENDENT_AMBULATORY_CARE_PROVIDER_SITE_OTHER): Payer: Medicare Other

## 2015-09-29 ENCOUNTER — Ambulatory Visit (INDEPENDENT_AMBULATORY_CARE_PROVIDER_SITE_OTHER): Payer: Medicare Other | Admitting: *Deleted

## 2015-09-29 DIAGNOSIS — I4891 Unspecified atrial fibrillation: Secondary | ICD-10-CM | POA: Diagnosis not present

## 2015-09-29 DIAGNOSIS — Z95810 Presence of automatic (implantable) cardiac defibrillator: Secondary | ICD-10-CM

## 2015-09-29 DIAGNOSIS — I255 Ischemic cardiomyopathy: Secondary | ICD-10-CM | POA: Diagnosis not present

## 2015-09-29 DIAGNOSIS — I059 Rheumatic mitral valve disease, unspecified: Secondary | ICD-10-CM | POA: Diagnosis not present

## 2015-09-29 DIAGNOSIS — Z7901 Long term (current) use of anticoagulants: Secondary | ICD-10-CM

## 2015-09-29 DIAGNOSIS — I359 Nonrheumatic aortic valve disorder, unspecified: Secondary | ICD-10-CM | POA: Diagnosis not present

## 2015-09-29 LAB — POCT INR: INR: 3.1

## 2015-09-29 NOTE — Progress Notes (Signed)
Remote ICD transmission.   

## 2015-09-30 NOTE — Telephone Encounter (Signed)
REFILL 

## 2015-09-30 NOTE — Progress Notes (Signed)
EPIC Encounter for ICM Monitoring  Patient Name: [name] is a 75 y.o. female Date: 09/30/2015 Primary Care Physican: Velna Hatchet, MD Primary Cardiologist: Stanford Breed Electrophysiologist: Allred Dry Weight: unknown      Heart Failure questions reviewed, pt asymptomatic   Thoracic impedance normal.   Recommendations: No changes.  Low sodium diet education provided   ICM trend: 09/29/2015    Follow-up plan: ICM clinic phone appointment on 10/31/2015.  Copy of ICM check sent to device physician.   Rosalene Billings, RN 09/30/2015 1:17 PM

## 2015-10-01 ENCOUNTER — Encounter: Payer: Self-pay | Admitting: Cardiology

## 2015-10-01 ENCOUNTER — Other Ambulatory Visit: Payer: Self-pay | Admitting: *Deleted

## 2015-10-01 ENCOUNTER — Telehealth: Payer: Self-pay | Admitting: Cardiology

## 2015-10-01 LAB — CUP PACEART REMOTE DEVICE CHECK
Battery Remaining Longevity: 66 mo
Brady Statistic AP VP Percent: 1 %
Brady Statistic AP VS Percent: 42 %
Brady Statistic AS VP Percent: 1 %
Date Time Interrogation Session: 20170724060014
HIGH POWER IMPEDANCE MEASURED VALUE: 53 Ohm
HighPow Impedance: 53 Ohm
Implantable Lead Implant Date: 20140403
Implantable Lead Location: 753859
Lead Channel Impedance Value: 410 Ohm
Lead Channel Pacing Threshold Amplitude: 0.75 V
Lead Channel Pacing Threshold Pulse Width: 0.5 ms
Lead Channel Sensing Intrinsic Amplitude: 1.1 mV
Lead Channel Setting Sensing Sensitivity: 0.5 mV
MDC IDC LEAD IMPLANT DT: 20140403
MDC IDC LEAD LOCATION: 753860
MDC IDC MSMT BATTERY REMAINING PERCENTAGE: 67 %
MDC IDC MSMT BATTERY VOLTAGE: 2.99 V
MDC IDC MSMT LEADCHNL RV IMPEDANCE VALUE: 610 Ohm
MDC IDC MSMT LEADCHNL RV PACING THRESHOLD AMPLITUDE: 0.75 V
MDC IDC MSMT LEADCHNL RV PACING THRESHOLD PULSEWIDTH: 0.5 ms
MDC IDC MSMT LEADCHNL RV SENSING INTR AMPL: 12 mV
MDC IDC PG SERIAL: 7080516
MDC IDC SET LEADCHNL RA PACING AMPLITUDE: 2 V
MDC IDC SET LEADCHNL RV PACING AMPLITUDE: 2.5 V
MDC IDC SET LEADCHNL RV PACING PULSEWIDTH: 0.5 ms
MDC IDC STAT BRADY AS VS PERCENT: 58 %
MDC IDC STAT BRADY RA PERCENT PACED: 40 %
MDC IDC STAT BRADY RV PERCENT PACED: 1 %

## 2015-10-01 MED ORDER — FUROSEMIDE 20 MG PO TABS: 20.0000 mg | ORAL_TABLET | Freq: Every day | ORAL | 3 refills | Status: DC

## 2015-10-01 NOTE — Telephone Encounter (Signed)
Patient saw L. Ingold, NP 03/2015 - recommended 4 month follow up  Patient saw B. Stanford Breed, MD 03/2014  Message routed to scheduling pool to address

## 2015-10-01 NOTE — Telephone Encounter (Signed)
Pt would like to make her appt to see Dr Stanford Breed 6 months before she sees  Allred please.

## 2015-10-02 ENCOUNTER — Encounter: Payer: Self-pay | Admitting: Cardiology

## 2015-10-02 NOTE — Telephone Encounter (Signed)
New message ° ° ° ° ° ° °Pt returning nurse call  °

## 2015-10-02 NOTE — Telephone Encounter (Signed)
This encounter was created in error - please disregard.

## 2015-10-15 ENCOUNTER — Encounter: Payer: Self-pay | Admitting: Cardiology

## 2015-10-15 NOTE — Progress Notes (Signed)
Letter  

## 2015-10-20 ENCOUNTER — Other Ambulatory Visit: Payer: Self-pay | Admitting: Cardiology

## 2015-10-23 DIAGNOSIS — R350 Frequency of micturition: Secondary | ICD-10-CM | POA: Diagnosis not present

## 2015-10-27 ENCOUNTER — Ambulatory Visit (INDEPENDENT_AMBULATORY_CARE_PROVIDER_SITE_OTHER): Payer: Medicare Other | Admitting: *Deleted

## 2015-10-27 DIAGNOSIS — I059 Rheumatic mitral valve disease, unspecified: Secondary | ICD-10-CM

## 2015-10-27 DIAGNOSIS — Z7901 Long term (current) use of anticoagulants: Secondary | ICD-10-CM

## 2015-10-27 DIAGNOSIS — I4891 Unspecified atrial fibrillation: Secondary | ICD-10-CM | POA: Diagnosis not present

## 2015-10-27 DIAGNOSIS — I359 Nonrheumatic aortic valve disorder, unspecified: Secondary | ICD-10-CM | POA: Diagnosis not present

## 2015-10-27 LAB — POCT INR: INR: 2

## 2015-10-31 ENCOUNTER — Ambulatory Visit (INDEPENDENT_AMBULATORY_CARE_PROVIDER_SITE_OTHER): Payer: Medicare Other

## 2015-10-31 DIAGNOSIS — I255 Ischemic cardiomyopathy: Secondary | ICD-10-CM

## 2015-10-31 DIAGNOSIS — Z95810 Presence of automatic (implantable) cardiac defibrillator: Secondary | ICD-10-CM

## 2015-10-31 NOTE — Progress Notes (Signed)
EPIC Encounter for ICM Monitoring  Patient Name: [name] is a 75 y.o. female Date: 10/31/2015 Primary Care Physican: Velna Hatchet, MD Primary Cardiologist: Stanford Breed Electrophysiologist: Allred Dry Weight: Does not weigh      Heart Failure questions reviewed, pt asymptomatic   Thoracic impedance normal.  Recommendations: No changes.  Low sodium diet education provided.    Follow-up plan: ICM clinic phone appointment on 12/01/2015.  Office visit scheduled with Dr Stanford Breed on 12/02/2015.  Copy of ICM check sent to device physician.   ICM trend: 10/31/2015       Rosalene Billings, RN 10/31/2015 11:39 AM

## 2015-11-11 ENCOUNTER — Telehealth: Payer: Self-pay | Admitting: Internal Medicine

## 2015-11-11 ENCOUNTER — Telehealth: Payer: Self-pay

## 2015-11-11 NOTE — Telephone Encounter (Signed)
Returned call to patient as requested in voice mail message.  She reported for the last couple of months she has had constant feeling of urgency to urinate and thought was related to a UTI although she never experienced any pain.  All tests from urologist came back normal and no infection.    She thought it could be related to the Lasix so she stopped taking Lasix on Sunday, 11/09/2015, and the feeling of constant urge to urinate has resolved.   She is questioning if Lasix can have a side effect of urgency and how important is for her to take the medication everyday.   She is supposed to go out of town and would like to know can she stop the Lasix.    Advised would send to Dr Stanford Breed for recommendation if she needs to restart Lasix 20 mg daily.  Appt with Dr Stanford Breed 12/02/2015.

## 2015-11-11 NOTE — Telephone Encounter (Signed)
Note not needed per patient

## 2015-11-11 NOTE — Telephone Encounter (Signed)
Would not DC lasix. Would arrange fuov with me [name]

## 2015-11-11 NOTE — Telephone Encounter (Signed)
Call to back to patient and advised Dr Stanford Breed recommended she continue to take prescribed Lasix daily and to follow up with him at the earliest appointment.   She stated she will try the PCP office to check if she can get an appointment with him for further recommendations and if needed will call to get an earlier appointment than 12/02/2015 with Dr Stanford Breed.  Advised PA or NP are also available for appointments.

## 2015-11-18 ENCOUNTER — Encounter: Payer: Self-pay | Admitting: Cardiology

## 2015-11-24 ENCOUNTER — Ambulatory Visit (INDEPENDENT_AMBULATORY_CARE_PROVIDER_SITE_OTHER): Payer: Medicare Other | Admitting: Pharmacist

## 2015-11-24 DIAGNOSIS — Z7901 Long term (current) use of anticoagulants: Secondary | ICD-10-CM | POA: Diagnosis not present

## 2015-11-24 DIAGNOSIS — I359 Nonrheumatic aortic valve disorder, unspecified: Secondary | ICD-10-CM

## 2015-11-24 DIAGNOSIS — I059 Rheumatic mitral valve disease, unspecified: Secondary | ICD-10-CM

## 2015-11-24 DIAGNOSIS — I4891 Unspecified atrial fibrillation: Secondary | ICD-10-CM

## 2015-11-24 LAB — POCT INR: INR: 2.6

## 2015-11-26 ENCOUNTER — Encounter: Payer: Self-pay | Admitting: Cardiology

## 2015-11-26 ENCOUNTER — Ambulatory Visit (INDEPENDENT_AMBULATORY_CARE_PROVIDER_SITE_OTHER): Payer: Medicare Other | Admitting: Cardiology

## 2015-11-26 VITALS — BP 110/72 | HR 69 | Ht 63.0 in | Wt 151.0 lb

## 2015-11-26 DIAGNOSIS — I359 Nonrheumatic aortic valve disorder, unspecified: Secondary | ICD-10-CM | POA: Diagnosis not present

## 2015-11-26 DIAGNOSIS — I10 Essential (primary) hypertension: Secondary | ICD-10-CM

## 2015-11-26 DIAGNOSIS — I2589 Other forms of chronic ischemic heart disease: Secondary | ICD-10-CM

## 2015-11-26 DIAGNOSIS — I4891 Unspecified atrial fibrillation: Secondary | ICD-10-CM | POA: Diagnosis not present

## 2015-11-26 DIAGNOSIS — I255 Ischemic cardiomyopathy: Secondary | ICD-10-CM

## 2015-11-26 DIAGNOSIS — E785 Hyperlipidemia, unspecified: Secondary | ICD-10-CM | POA: Diagnosis not present

## 2015-11-26 DIAGNOSIS — Z95810 Presence of automatic (implantable) cardiac defibrillator: Secondary | ICD-10-CM

## 2015-11-26 NOTE — Patient Instructions (Signed)
Medication Instructions:   NO CHANGE  Labwork:  Your physician recommends that you return for lab work WITH NEXT WARFARIN CHECK AT [place]  Testing/Procedures:  A chest x-ray takes a picture of the organs and structures inside the chest, including the heart, lungs, and blood vessels. This test can show several things, including, whether the heart is enlarges; whether fluid is building up in the lungs; and whether pacemaker / defibrillator leads are still in place. AT [facility]  Follow-Up:  Your physician wants you to follow-up in: [place] will receive a reminder letter in the mail two months in advance. If you don't receive a letter, please call our office to schedule the follow-up appointment.   If you need a refill on your cardiac medications before your next appointment, please call your pharmacy.

## 2015-11-26 NOTE — Progress Notes (Signed)
HPI: FU PAF, AVR, CM, H/O cardiac arrest s/p ICD.  History of severe AS, mitral regurgitation, aneurysm of the ascending thoracic aorta (status post aortic valve replacement, mitral valve repair, resection and grafting of the ascending thoracic aortic aneurysm on Jul 07, 2006), history of paroxysmal atrial fibrillation (status post maze procedure on Jul 07, 2006). Followup catheterization performed secondary to elevated troponin and recurrent atrial fibrillation revealed an ejection fraction of 25-30% and occlusion of the reimplanted left main. The patient subsequently underwent redo coronary artery bypass and graft with a LIMA to the LAD and a saphenous vein graft to the OM. She also had an epicardial lead placed if she required biventricular pacing in the future. Cardiac catheterization repeated in January of 2014 because of reduced LV function. The left main was occluded. The LAD, circumflex and right coronary artery were patent. The LIMA to the LAD was patent but atretic. Saphenous vein graft to the obtuse marginal was patent. This graft filled the obtuse marginal and ramus intermedius and the entire LAD filled retrograde from this graft. Ejection fraction was 35%. Patient referred to Dr. Rayann Heman for consideration of ICD; also placed on tikosyn for atrial fibrillation. Patient then admitted in March of 2014 following a ventricular fibrillation arrest. She required a prolonged resuscitation. Her QT was prolonged and her tikosyn DCed. Her hospital course was complicated by a DVT in her left arm. She ultimately had a [employer] Jude ICD implanted. Amiodarone initiated. Echocardiogram 1/17 showed normal LV function; s/p AVR (mean gradient 7 mmHg), trace AI, s/p MV repair, mild MS, trace MR, mild LAE, moderate TR. Since I last saw her, she has mild dyspnea on exertion but no orthopnea, PND, pedal edema, chest pain, palpitations or syncope.  Current Outpatient Prescriptions  Medication Sig Dispense Refill  .  carvedilol (COREG) 12.5 MG tablet TAKE 1 TABLET (12.5 MG TOTAL) BY MOUTH 2 (TWO) TIMES DAILY WITH A MEAL. 180 tablet 3  . Cyanocobalamin (VITAMIN B-12) 2500 MCG SUBL Place 1 tablet under the tongue every Monday, Wednesday, and Friday.     . ergocalciferol (VITAMIN D2) 50000 UNITS capsule Take 50,000 Units by mouth once a week. Monday    . furosemide (LASIX) 20 MG tablet Take 1 tablet (20 mg total) by mouth daily. 90 tablet 3  . linagliptin (TRADJENTA) 5 MG TABS tablet Take 1 tablet (5 mg total) by mouth daily. 30 tablet   . losartan (COZAAR) 50 MG tablet TAKE 1 TABLET (50 MG TOTAL) BY MOUTH DAILY. 90 tablet 1  . NON FORMULARY ([employer]) Take 500 mg of elemental calcium by mouth at bedtime    . omeprazole (PRILOSEC OTC) 20 MG tablet Take 20 mg by mouth daily before breakfast.     . PACERONE 200 MG tablet Take 1 tablet (200 mg total) by mouth daily. KEEP OV. 90 tablet 0  . Psyllium (METAMUCIL PO) Take 1 teaspoon by mouth daily with water    . Teriparatide, Recombinant, (FORTEO) 600 MCG/2.4ML SOLN Inject 20 mcg into the skin daily.    Marland Kitchen warfarin (COUMADIN) 3 MG tablet TAKE AS DIRECTED BY COUMADIN CLINIC 30 tablet 2   No current facility-administered medications for this visit.      Past Medical History:  Diagnosis Date  . AICD (automatic cardioverter/defibrillator) present    2014  . Anemia   . Arthritis    OSTEO  . Atrial fibrillation [facility])    A.fib/flutter: s/p MAZE 2008, DCCV 2011, on coumadin; previously on flecainide, but  dc'd due to worsening EF. Tikosyn discontinued 06/2012 after cardiac arrest.  . Carcinoma in situ of vulva   . Chronic systolic CHF (congestive heart failure) (HCC)    EF 35%  . Coronary artery disease    Occluded LM 2/2 previous aortic root surgery; s/p 2v CABG 2010 LIMA to LAD, SVG to OM; Cath 03/11/12 patent SVG to OM, atretic LIMA to LAD, patent RCA, occluded native LM, EF 35%   . CVA (cerebral vascular accident) ([place])    2008 - felt due to oscillating calcium on  aortic valve  . Diabetes mellitus   . Diverticulitis   . Dysrhythmia   . Fibroid   . Fracture of lumbar spine ([place])   . GERD (gastroesophageal reflux disease)   . Hypertension   . Ischemic cardiomyopathy    EF 35%  . Kidney stones   . Mitral regurgitation    s/p mitral valve repair 2008  . Osteoporosis   . Ovarian cyst, left 2008-2009  . Severe aortic stenosis    s/p homograft aortic root replacement 2008  . Stroke ([place])   . Superficial venous thrombosis of upper extremity    06/2012 - LUE  . Thoracic aortic aneurysm [facility])    s/p resection and grafting 2008  . Ventricular fibrillation ([place])    VF arrest/VT/torsades 06/2012 with prolonged hosp with VDRF, cardiogen shock, asp PNA, encephalopathy, anemia, shock liver, hypernatremia - s/p ICD implantation 06/08/12.    Past Surgical History:  Procedure Laterality Date  . aneurysm and left sided maze  2008  . AORTIC ROOT REPLACEMENT  2008   homograft  . CARDIAC DEFIBRILLATOR PLACEMENT  06/08/2012   St. Jude Medical Fortify [address]ura DR  implanted by Dr Rayann Heman following a cardiac arrest  . CATARACT EXTRACTION    . CORONARY ARTERY BYPASS GRAFT  4/10   LIMA to LAD, SVG to OM  . EYE SURGERY    . FOOT SURGERY Left    removed neuroma  . IMPLANTABLE CARDIOVERTER DEFIBRILLATOR IMPLANT N/A 06/08/2012   Procedure: IMPLANTABLE CARDIOVERTER DEFIBRILLATOR IMPLANT;  Surgeon: Thompson Grayer, MD;  Location: [facility] CATH LAB;  Service: Cardiovascular;  Laterality: N/A;  . KYPHOPLASTY N/A 12/11/2014   Procedure: KYPHOPLASTY T-12;  Surgeon: Karie Chimera, MD;  Location: [facility] NEURO ORS;  Service: Neurosurgery;  Laterality: N/A;  KYPHOPLASTY T-12  . MITRAL VALVE REPLACEMENT  2008   due to severe MR  . Resection and grafting of ascending thoracic aortic    . RIGHT HEART CATHETERIZATION N/A 04/03/2012   Procedure: RIGHT HEART CATH;  Surgeon: [name], MD;  Location: [facility] CATH LAB;  Service: Cardiovascular;  Laterality: N/A;  . WIDE EXCISION OF VULVA     CA INSITU      Social History   Social History  . Marital status: Married    Spouse name: N/A  . Number of children: 0  . Years of education: N/A   Occupational History  . Retired Radiation protection practitioner    Social History Main Topics  . Smoking status: Former Smoker    Types: Cigarettes    Quit date: 03/09/1983  . Smokeless tobacco: Never Used  . Alcohol use No  . Drug use: No  . Sexual activity: Not Currently    Birth control/ protection: Post-menopausal   Other Topics Concern  . Not on file   Social History Narrative   Her mother died at age 8 from a stroke.  Her father died at age 58, had chronic obstructive pulmonary disease and colon disease.  Regular exercise: was in cardiac rehab   Caffeine use: none    Family History  Problem Relation Age of Onset  . Hypertension Mother   . Breast cancer Maternal Aunt     age 2's  . Colon cancer Paternal Grandmother   . Diabetes Maternal Uncle   . Diabetes Paternal Uncle   . Emphysema Father     ROS: back pain but no fevers or chills, productive cough, hemoptysis, dysphasia, odynophagia, melena, hematochezia, dysuria, hematuria, rash, seizure activity, orthopnea, PND, pedal edema, claudication. Remaining systems are negative.  Physical Exam: Well-developed well-nourished in no acute distress.  Skin is warm and dry.  HEENT is normal.  Neck is supple.  Chest is clear to auscultation with normal expansion.  Cardiovascular exam is regular rate and rhythm.  1/6 systolic ejection murmur Abdominal exam nontender or distended. No masses palpated. Extremities show no edema. neuro grossly intact  ECG-atrial paced rhythm, left axis deviation, left ventricular hypertrophy, cannot rule out prior septal infarct.  A/P  1 status post aortic valve replacement-continue SBE prophylaxis.  2 paroxysmal atrial fibrillation-patient remains in atrial paced rhythm. Continue amiodarone. Check TSH, liver functions and chest x-ray. Continue Coumadin.  3  hypertension-blood pressure controlled. Continue present medications.  4 hyperlipidemia-continue diet.  5 prior ICD-management per electrophysiology.  6 ischemic cardiomyopathy-continue ARB and beta blocker. LV function improved on most recent echo.  7 mitral valve replacement-continue SBE prophylaxis.  [name], MD

## 2015-12-01 ENCOUNTER — Other Ambulatory Visit: Payer: Self-pay | Admitting: *Deleted

## 2015-12-01 ENCOUNTER — Ambulatory Visit (HOSPITAL_COMMUNITY)
Admission: RE | Admit: 2015-12-01 | Discharge: 2015-12-01 | Disposition: A | Discharge status: Home / Self Care | Payer: Medicare Other | Source: Ambulatory Visit | Attending: Cardiology | Admitting: Cardiology

## 2015-12-01 ENCOUNTER — Ambulatory Visit (INDEPENDENT_AMBULATORY_CARE_PROVIDER_SITE_OTHER): Payer: Medicare Other

## 2015-12-01 DIAGNOSIS — J9811 Atelectasis: Secondary | ICD-10-CM | POA: Diagnosis not present

## 2015-12-01 DIAGNOSIS — I4891 Unspecified atrial fibrillation: Secondary | ICD-10-CM | POA: Insufficient documentation

## 2015-12-01 DIAGNOSIS — Z95810 Presence of automatic (implantable) cardiac defibrillator: Secondary | ICD-10-CM | POA: Diagnosis not present

## 2015-12-01 DIAGNOSIS — R9389 Abnormal findings on diagnostic imaging of other specified body structures: Secondary | ICD-10-CM

## 2015-12-01 DIAGNOSIS — I255 Ischemic cardiomyopathy: Secondary | ICD-10-CM

## 2015-12-02 ENCOUNTER — Ambulatory Visit: Payer: Medicare Other | Admitting: Cardiology

## 2015-12-02 NOTE — Progress Notes (Signed)
EPIC Encounter for ICM Monitoring  Patient Name: [name] is a 75 y.o. female Date: 12/02/2015 Primary Care Physican: Velna Hatchet, MD Primary Cardiologist: Stanford Breed Electrophysiologist: Allred Dry Weight: 151 lb      Heart Failure questions reviewed, pt asymptomatic.  She stated she feels fine at this time.  Patient stated she was told the chest x-ray from 11/27/2015 showed left lung fluid.  Thoracic impedance abnormal suggesting fluid accumulation 11/23/2015.   She did not get labs drawn at the time of 11/26/2015 office visit with Dr Stanford Breed and plans on getting it drawn on 12/22/2015 when she has coumadin check.    LABS: 11/26/2015 - Ordered but patient waiting until 10/16 to be drawn with coumadin check 03/20/2015 Creatinine 1.53, BUN 21, Potassium 4.4, Sodium 139 12/11/2014 Creatinine 1.60, BUN 34, Potassium 3.8, Sodium 139 12/09/2014 Creatinine 1.72, BUN 32, Potassium 3.8, Sodium 138 11/11/2014 Creatinine 1.50, BUN 21, Potassium 4.2, Sodium 140  Recommendations:   Copy of ICM check sent to Dr Stanford Breed and Dr Rayann Heman for review and recommendations.   Follow-up plan: ICM clinic phone appointment on 12/11/2015.     ICM trend: 12/01/2015       Rosalene Billings, RN 12/02/2015 10:09 AM

## 2015-12-03 MED ORDER — FUROSEMIDE 20 MG PO TABS: 20.0000 mg | ORAL_TABLET | Freq: Every day | ORAL | 3 refills | Status: DC

## 2015-12-03 NOTE — Progress Notes (Signed)
Attempted call to patient to advise of Dr Jackalyn Lombard recommendations.

## 2015-12-03 NOTE — Progress Notes (Signed)
Reviewed with Dr Rayann Heman in the office and he recommended to increase Furosemide 20 mg to 2 tablets daily as permanent change.  BMET to be done within the next 5 days.

## 2015-12-03 NOTE — Progress Notes (Signed)
Patient returned call.  Advised Dr Rayann Heman recommended to change Furosemide dosage to 40 mg daily and ordered BMET for next week.  Patient already has BMET along with CBC, TSH, Hepatic Function Panel, ordered by Dr Stanford Breed at office visit on 11/26/2015 and will have it drawn on Monday, 12/08/2015.

## 2015-12-08 ENCOUNTER — Other Ambulatory Visit: Payer: Medicare Other | Admitting: *Deleted

## 2015-12-08 DIAGNOSIS — I4891 Unspecified atrial fibrillation: Secondary | ICD-10-CM

## 2015-12-08 DIAGNOSIS — I5022 Chronic systolic (congestive) heart failure: Secondary | ICD-10-CM | POA: Diagnosis not present

## 2015-12-08 DIAGNOSIS — E119 Type 2 diabetes mellitus without complications: Secondary | ICD-10-CM | POA: Diagnosis not present

## 2015-12-08 DIAGNOSIS — K219 Gastro-esophageal reflux disease without esophagitis: Secondary | ICD-10-CM | POA: Diagnosis not present

## 2015-12-08 DIAGNOSIS — Z6825 Body mass index (BMI) 25.0-25.9, adult: Secondary | ICD-10-CM | POA: Diagnosis not present

## 2015-12-08 DIAGNOSIS — I10 Essential (primary) hypertension: Secondary | ICD-10-CM | POA: Diagnosis not present

## 2015-12-08 LAB — CBC
HCT: 38.8 % (ref 35.0–45.0)
Hemoglobin: 12.7 g/dL (ref 11.7–15.5)
MCH: 31.1 pg (ref 27.0–33.0)
MCHC: 32.7 g/dL (ref 32.0–36.0)
MCV: 95.1 fL (ref 80.0–100.0)
MPV: 10 fL (ref 7.5–12.5)
PLATELETS: 327 10*3/uL (ref 140–400)
RBC: 4.08 MIL/uL (ref 3.80–5.10)
RDW: 13.4 % (ref 11.0–15.0)
WBC: 8.4 10*3/uL (ref 3.8–10.8)

## 2015-12-09 ENCOUNTER — Telehealth: Payer: Self-pay | Admitting: *Deleted

## 2015-12-09 DIAGNOSIS — R7989 Other specified abnormal findings of blood chemistry: Secondary | ICD-10-CM

## 2015-12-09 DIAGNOSIS — N289 Disorder of kidney and ureter, unspecified: Secondary | ICD-10-CM

## 2015-12-09 DIAGNOSIS — R945 Abnormal results of liver function studies: Secondary | ICD-10-CM

## 2015-12-09 LAB — BASIC METABOLIC PANEL
BUN: 29 mg/dL — ABNORMAL HIGH (ref 7–25)
CHLORIDE: 96 mmol/L — AB (ref 98–110)
CO2: 30 mmol/L (ref 20–31)
Calcium: 9.2 mg/dL (ref 8.6–10.4)
Creat: 1.81 mg/dL — ABNORMAL HIGH (ref 0.60–0.93)
Glucose, Bld: 131 mg/dL — ABNORMAL HIGH (ref 65–99)
POTASSIUM: 4 mmol/L (ref 3.5–5.3)
Sodium: 140 mmol/L (ref 135–146)

## 2015-12-09 LAB — HEPATIC FUNCTION PANEL
ALK PHOS: 88 U/L (ref 33–130)
ALT: 42 U/L — ABNORMAL HIGH (ref 6–29)
AST: 49 U/L — AB (ref 10–35)
Albumin: 4 g/dL (ref 3.6–5.1)
BILIRUBIN DIRECT: 0.1 mg/dL (ref <=0.2)
BILIRUBIN TOTAL: 0.6 mg/dL (ref 0.2–1.2)
Indirect Bilirubin: 0.5 mg/dL (ref 0.2–1.2)
Total Protein: 6.7 g/dL (ref 6.1–8.1)

## 2015-12-09 LAB — TSH: TSH: 5.64 mIU/L — ABNORMAL HIGH

## 2015-12-09 NOTE — Telephone Encounter (Signed)
Spoke with pt, aware of lab results. She will come to the [address] office on 02-02-16 for repeat lab work. Lab orders placed.

## 2015-12-09 NOTE — Telephone Encounter (Signed)
-----   Message from Lelon Perla, MD sent at 12/09/2015  5:08 AM EDT ----- Repeat bmet, lfts and tsh 8 weeks [name]

## 2015-12-11 ENCOUNTER — Ambulatory Visit (INDEPENDENT_AMBULATORY_CARE_PROVIDER_SITE_OTHER): Payer: Medicare Other

## 2015-12-11 DIAGNOSIS — Z95810 Presence of automatic (implantable) cardiac defibrillator: Secondary | ICD-10-CM

## 2015-12-11 DIAGNOSIS — I255 Ischemic cardiomyopathy: Secondary | ICD-10-CM

## 2015-12-11 MED ORDER — FUROSEMIDE 20 MG PO TABS: ORAL_TABLET | ORAL | 3 refills | Status: DC

## 2015-12-11 NOTE — Progress Notes (Signed)
EPIC Encounter for ICM Monitoring  Patient Name: [name] is a 75 y.o. female Date: 12/11/2015 Primary Care Physican: Velna Hatchet, MD Primary Cardiologist: Stanford Breed Electrophysiologist: Allred Dry Weight: unknown      Heart Failure questions reviewed, pt asymptomatic   Thoracic impedance returned to normal following Furosemide dosage increase to 40 mg daily  LABS: 12/09/2015 Creatinine 1.81, BUN 29, Potassium 4.0, Sodium 140 03/20/2015 Creatinine 1.53, BUN 21, Potassium 4.4, Sodium 139 12/11/2014 Creatinine 1.60, BUN 34, Potassium 3.8, Sodium 139 12/09/2014 Creatinine 1.72, BUN 32, Potassium 3.8, Sodium 138 11/11/2014 Creatinine 1.50, BUN 21, Potassium 4.2, Sodium 140   Recommendations: No changes.  Low sodium diet education provided.    Follow-up plan: ICM clinic phone appointment on 01/01/2016.  Copy of ICM check sent to primary cardiologist and device physician.   ICM trend: 12/11/2015       Rosalene Billings, RN 12/11/2015 10:58 AM

## 2015-12-22 ENCOUNTER — Ambulatory Visit (INDEPENDENT_AMBULATORY_CARE_PROVIDER_SITE_OTHER): Payer: Medicare Other | Admitting: *Deleted

## 2015-12-22 DIAGNOSIS — I359 Nonrheumatic aortic valve disorder, unspecified: Secondary | ICD-10-CM | POA: Diagnosis not present

## 2015-12-22 DIAGNOSIS — Z7901 Long term (current) use of anticoagulants: Secondary | ICD-10-CM

## 2015-12-22 DIAGNOSIS — I059 Rheumatic mitral valve disease, unspecified: Secondary | ICD-10-CM | POA: Diagnosis not present

## 2015-12-22 DIAGNOSIS — I4891 Unspecified atrial fibrillation: Secondary | ICD-10-CM

## 2015-12-22 LAB — POCT INR: INR: 2.3

## 2015-12-31 ENCOUNTER — Other Ambulatory Visit: Payer: Self-pay | Admitting: Internal Medicine

## 2015-12-31 NOTE — Telephone Encounter (Signed)
Dr. Crenshaw pt. °

## 2016-01-01 ENCOUNTER — Ambulatory Visit (INDEPENDENT_AMBULATORY_CARE_PROVIDER_SITE_OTHER): Payer: Medicare Other | Admitting: *Deleted

## 2016-01-01 DIAGNOSIS — I255 Ischemic cardiomyopathy: Secondary | ICD-10-CM | POA: Diagnosis not present

## 2016-01-01 DIAGNOSIS — Z95810 Presence of automatic (implantable) cardiac defibrillator: Secondary | ICD-10-CM

## 2016-01-01 NOTE — Progress Notes (Signed)
Remote ICD transmission.   

## 2016-01-02 NOTE — Progress Notes (Signed)
EPIC Encounter for ICM Monitoring  Patient Name: [name] is a 75 y.o. female Date: 01/02/2016 Primary Care Physican: Velna Hatchet, MD Primary Cardiologist:Crenshaw Electrophysiologist: Allred Dry Weight:    unknown      Heart Failure questions reviewed, pt asymptomatic   Thoracic impedance returned to normal on 12/03/2015 (viewed on direct trend).   She reported she was out of town last week which may have cause the fluid accumulation.   Recommendations:  No changes.  Advised to limit salt intake to 2000 mg daily.  Encouraged to call for fluid symptoms.    Follow-up plan: ICM clinic phone appointment on 02/02/2016.  Copy of ICM check sent to device physician.   ICM trend: 01/01/2016       Rosalene Billings, RN 01/02/2016 2:10 PM

## 2016-01-06 DIAGNOSIS — Z23 Encounter for immunization: Secondary | ICD-10-CM | POA: Diagnosis not present

## 2016-01-07 ENCOUNTER — Encounter: Payer: Self-pay | Admitting: Cardiology

## 2016-01-07 DIAGNOSIS — M810 Age-related osteoporosis without current pathological fracture: Secondary | ICD-10-CM | POA: Diagnosis not present

## 2016-01-07 DIAGNOSIS — E559 Vitamin D deficiency, unspecified: Secondary | ICD-10-CM | POA: Diagnosis not present

## 2016-01-13 DIAGNOSIS — H2513 Age-related nuclear cataract, bilateral: Secondary | ICD-10-CM | POA: Diagnosis not present

## 2016-01-22 ENCOUNTER — Encounter: Payer: Self-pay | Admitting: Cardiology

## 2016-01-29 LAB — CUP PACEART REMOTE DEVICE CHECK
Battery Remaining Longevity: 62 mo
Battery Voltage: 2.99 V
Brady Statistic AS VP Percent: 1 %
Brady Statistic AS VS Percent: 57 %
Date Time Interrogation Session: 20171026084417
HIGH POWER IMPEDANCE MEASURED VALUE: 52 Ohm
HIGH POWER IMPEDANCE MEASURED VALUE: 52 Ohm
Implantable Lead Location: 753859
Implantable Pulse Generator Implant Date: 20140403
Lead Channel Pacing Threshold Amplitude: 0.75 V
Lead Channel Pacing Threshold Pulse Width: 0.5 ms
Lead Channel Sensing Intrinsic Amplitude: 12 mV
Lead Channel Setting Pacing Amplitude: 2 V
Lead Channel Setting Sensing Sensitivity: 0.5 mV
MDC IDC LEAD IMPLANT DT: 20140403
MDC IDC LEAD IMPLANT DT: 20140403
MDC IDC LEAD LOCATION: 753860
MDC IDC MSMT BATTERY REMAINING PERCENTAGE: 64 %
MDC IDC MSMT LEADCHNL RA IMPEDANCE VALUE: 400 Ohm
MDC IDC MSMT LEADCHNL RA SENSING INTR AMPL: 2.6 mV
MDC IDC MSMT LEADCHNL RV IMPEDANCE VALUE: 590 Ohm
MDC IDC MSMT LEADCHNL RV PACING THRESHOLD AMPLITUDE: 0.75 V
MDC IDC MSMT LEADCHNL RV PACING THRESHOLD PULSEWIDTH: 0.5 ms
MDC IDC SET LEADCHNL RV PACING AMPLITUDE: 2.5 V
MDC IDC SET LEADCHNL RV PACING PULSEWIDTH: 0.5 ms
MDC IDC STAT BRADY AP VP PERCENT: 1 %
MDC IDC STAT BRADY AP VS PERCENT: 43 %
MDC IDC STAT BRADY RA PERCENT PACED: 42 %
MDC IDC STAT BRADY RV PERCENT PACED: 1 %
Pulse Gen Serial Number: 7080516

## 2016-02-02 ENCOUNTER — Ambulatory Visit (HOSPITAL_COMMUNITY)
Admission: RE | Admit: 2016-02-02 | Discharge: 2016-02-02 | Disposition: A | Discharge status: Home / Self Care | Payer: Medicare Other | Source: Ambulatory Visit | Attending: Cardiology | Admitting: Cardiology

## 2016-02-02 ENCOUNTER — Other Ambulatory Visit (INDEPENDENT_AMBULATORY_CARE_PROVIDER_SITE_OTHER): Payer: Medicare Other | Admitting: *Deleted

## 2016-02-02 ENCOUNTER — Ambulatory Visit (INDEPENDENT_AMBULATORY_CARE_PROVIDER_SITE_OTHER): Payer: Medicare Other

## 2016-02-02 ENCOUNTER — Ambulatory Visit (INDEPENDENT_AMBULATORY_CARE_PROVIDER_SITE_OTHER): Payer: Medicare Other | Admitting: Pharmacist

## 2016-02-02 DIAGNOSIS — N289 Disorder of kidney and ureter, unspecified: Secondary | ICD-10-CM

## 2016-02-02 DIAGNOSIS — R938 Abnormal findings on diagnostic imaging of other specified body structures: Secondary | ICD-10-CM | POA: Insufficient documentation

## 2016-02-02 DIAGNOSIS — I359 Nonrheumatic aortic valve disorder, unspecified: Secondary | ICD-10-CM

## 2016-02-02 DIAGNOSIS — Z7901 Long term (current) use of anticoagulants: Secondary | ICD-10-CM | POA: Diagnosis not present

## 2016-02-02 DIAGNOSIS — E559 Vitamin D deficiency, unspecified: Secondary | ICD-10-CM | POA: Diagnosis not present

## 2016-02-02 DIAGNOSIS — I255 Ischemic cardiomyopathy: Secondary | ICD-10-CM | POA: Diagnosis not present

## 2016-02-02 DIAGNOSIS — Z95810 Presence of automatic (implantable) cardiac defibrillator: Secondary | ICD-10-CM

## 2016-02-02 DIAGNOSIS — R7989 Other specified abnormal findings of blood chemistry: Secondary | ICD-10-CM

## 2016-02-02 DIAGNOSIS — I4891 Unspecified atrial fibrillation: Secondary | ICD-10-CM

## 2016-02-02 DIAGNOSIS — I059 Rheumatic mitral valve disease, unspecified: Secondary | ICD-10-CM

## 2016-02-02 DIAGNOSIS — R946 Abnormal results of thyroid function studies: Secondary | ICD-10-CM | POA: Diagnosis not present

## 2016-02-02 DIAGNOSIS — R918 Other nonspecific abnormal finding of lung field: Secondary | ICD-10-CM | POA: Diagnosis not present

## 2016-02-02 DIAGNOSIS — R945 Abnormal results of liver function studies: Secondary | ICD-10-CM

## 2016-02-02 DIAGNOSIS — R9389 Abnormal findings on diagnostic imaging of other specified body structures: Secondary | ICD-10-CM

## 2016-02-02 LAB — BASIC METABOLIC PANEL
BUN: 32 mg/dL — AB (ref 7–25)
CO2: 31 mmol/L (ref 20–31)
Calcium: 9.3 mg/dL (ref 8.6–10.4)
Chloride: 99 mmol/L (ref 98–110)
Creat: 1.9 mg/dL — ABNORMAL HIGH (ref 0.60–0.93)
GLUCOSE: 104 mg/dL — AB (ref 65–99)
POTASSIUM: 4.6 mmol/L (ref 3.5–5.3)
SODIUM: 141 mmol/L (ref 135–146)

## 2016-02-02 LAB — POCT INR: INR: 2.6

## 2016-02-02 NOTE — Addendum Note (Signed)
Addended by: Eulis Foster on: 02/02/2016 02:18 PM   Modules accepted: Orders

## 2016-02-02 NOTE — Progress Notes (Addendum)
EPIC Encounter for ICM Monitoring  Patient Name: [name] is a 75 y.o. female Date: 02/02/2016 Primary Care Physican: Velna Hatchet, MD Primary Cardiologist:Crenshaw Electrophysiologist: Allred Dry Weight:151 lbs             Heart Failure questions reviewed, pt asymptomatic.  She had a follow up chest x-ray today due to chest x-ray on 11/27/2015 showed left lung fluid.  Thoracic impedance normal since 11/23.  Impedance was below baseline 11/18 to 11/23.  LABS: 12/09/2015 Creatinine 1.81, BUN 29, Potassium 4.0, Sodium 140 03/20/2015 Creatinine 1.53, BUN 21, Potassium 4.4, Sodium 139 12/11/2014 Creatinine 1.60, BUN 34, Potassium 3.8, Sodium 139 12/09/2014 Creatinine 1.72, BUN 32, Potassium 3.8, Sodium 138 11/11/2014 Creatinine 1.50, BUN 21, Potassium 4.2, Sodium 140  Recommendations: No changes.  Reinforced low salt food choices and limiting fluid intake to < 2 liters per day. Encouraged to call for fluid symptoms.    Follow-up plan: ICM clinic phone appointment on 03/04/2016.  Copy of ICM check sent to primary cardiologist and device physician.   ICM trend: 02/02/2016             Rosalene Billings, RN 02/02/2016 11:27 AM

## 2016-02-02 NOTE — Progress Notes (Signed)
Vit d 

## 2016-02-02 NOTE — Addendum Note (Signed)
Addended by: Eulis Foster on: 02/02/2016 02:19 PM   Modules accepted: Orders

## 2016-02-03 ENCOUNTER — Telehealth: Payer: Self-pay

## 2016-02-03 ENCOUNTER — Encounter: Payer: Self-pay | Admitting: *Deleted

## 2016-02-03 DIAGNOSIS — Z961 Presence of intraocular lens: Secondary | ICD-10-CM | POA: Diagnosis not present

## 2016-02-03 DIAGNOSIS — H02839 Dermatochalasis of unspecified eye, unspecified eyelid: Secondary | ICD-10-CM | POA: Diagnosis not present

## 2016-02-03 DIAGNOSIS — I255 Ischemic cardiomyopathy: Secondary | ICD-10-CM

## 2016-02-03 DIAGNOSIS — E119 Type 2 diabetes mellitus without complications: Secondary | ICD-10-CM | POA: Diagnosis not present

## 2016-02-03 DIAGNOSIS — H26491 Other secondary cataract, right eye: Secondary | ICD-10-CM | POA: Diagnosis not present

## 2016-02-03 LAB — VITAMIN D 25 HYDROXY (VIT D DEFICIENCY, FRACTURES): Vit D, 25-Hydroxy: 53 ng/mL (ref 30–100)

## 2016-02-03 LAB — TSH: TSH: 5.44 mIU/L — ABNORMAL HIGH

## 2016-02-03 NOTE — Telephone Encounter (Signed)
Patient called needing refill of Furosemide.  She stated spoke with Dr Jacalyn Lefevre nurse and the labs result from 02/02/2016  follow up chest x-ray have been reviewed by Dr Stanford Breed.  Chest x-ray was normal.   Creatinine increased from 1.81 a month ago to 1.90 on 02/02/2016.  Patient is asking if she should continue on Furosemide 20 mg 2 tablets (40 mg total) daily that was prescribed in September.  She prefers to take Furosemide 20 mg daily if possible.     Advised would send to Dr Rayann Heman and Dr Stanford Breed for recommendation.   Patient stated she needs a refill of the medication.

## 2016-02-03 NOTE — Telephone Encounter (Signed)
This encounter was created in error - please disregard.

## 2016-02-04 MED ORDER — FUROSEMIDE 20 MG PO TABS: ORAL_TABLET | ORAL | 3 refills | Status: DC

## 2016-02-04 NOTE — Telephone Encounter (Signed)
Spoke with pt, Aware of dr crenshaw's recommendations.  °

## 2016-02-04 NOTE — Telephone Encounter (Signed)
Ok to take lasix 20 mg daily and additional 20 mg daily as needed [employer]

## 2016-02-05 ENCOUNTER — Telehealth: Payer: Self-pay | Admitting: Cardiology

## 2016-02-05 DIAGNOSIS — I255 Ischemic cardiomyopathy: Secondary | ICD-10-CM

## 2016-02-05 MED ORDER — FUROSEMIDE 20 MG PO TABS: ORAL_TABLET | ORAL | 3 refills | Status: DC

## 2016-02-05 NOTE — Telephone Encounter (Signed)
New message   Pt verbalized that she was told by rn that her medication was going to be sent to pharmacy and she stated that she has not received anything  02/03/16 notes

## 2016-02-05 NOTE — Telephone Encounter (Signed)
rx refill sent.  Pt notified, verified pharmacy

## 2016-02-10 DIAGNOSIS — H26491 Other secondary cataract, right eye: Secondary | ICD-10-CM | POA: Diagnosis not present

## 2016-02-16 ENCOUNTER — Other Ambulatory Visit: Payer: Self-pay | Admitting: Cardiology

## 2016-02-24 ENCOUNTER — Other Ambulatory Visit: Payer: Self-pay | Admitting: Internal Medicine

## 2016-02-24 ENCOUNTER — Other Ambulatory Visit: Payer: Self-pay | Admitting: Cardiology

## 2016-02-24 DIAGNOSIS — H26491 Other secondary cataract, right eye: Secondary | ICD-10-CM | POA: Diagnosis not present

## 2016-02-24 DIAGNOSIS — H26492 Other secondary cataract, left eye: Secondary | ICD-10-CM | POA: Diagnosis not present

## 2016-02-25 NOTE — Telephone Encounter (Signed)
Rx(s) sent to pharmacy electronically.  

## 2016-03-04 ENCOUNTER — Ambulatory Visit (INDEPENDENT_AMBULATORY_CARE_PROVIDER_SITE_OTHER): Payer: Medicare Other

## 2016-03-04 DIAGNOSIS — Z95810 Presence of automatic (implantable) cardiac defibrillator: Secondary | ICD-10-CM | POA: Diagnosis not present

## 2016-03-04 DIAGNOSIS — I255 Ischemic cardiomyopathy: Secondary | ICD-10-CM

## 2016-03-04 NOTE — Progress Notes (Signed)
EPIC Encounter for ICM Monitoring  Patient Name: [name] is a 75 y.o. female Date: 03/04/2016 Primary Care Physican: Velna Hatchet, MD Primary Cardiologist:Crenshaw Electrophysiologist: Allred Dry Weight:149 lbs        Heart Failure questions reviewed, pt asymptomatic   Thoracic impedance normal   LABS: 12/09/2015 Creatinine 1.81, BUN 29, Potassium 4.0, Sodium 140 03/20/2015 Creatinine 1.53, BUN 21, Potassium 4.4, Sodium 139 12/11/2014 Creatinine 1.60, BUN 34, Potassium 3.8, Sodium 139 12/09/2014 Creatinine 1.72, BUN 32, Potassium 3.8, Sodium 138 11/11/2014 Creatinine 1.50, BUN 21, Potassium 4.2, Sodium 140  Recommendations: No changes.  Reinforced to limit low salt food choices to 2000 mg day and limiting fluid intake to < 2 liters per day. Encouraged to call for fluid symptoms.  Provided ICM direct number.  Follow-up plan: ICM clinic phone appointment on 04/06/2016.  Copy of ICM check sent to device physician.   3 month ICM trend : 03/04/2016   1 Year ICM trend:      Rosalene Billings, RN 03/04/2016 12:53 PM

## 2016-03-12 ENCOUNTER — Telehealth: Payer: Self-pay | Admitting: Internal Medicine

## 2016-03-12 NOTE — Telephone Encounter (Signed)
Mrs. Osoria is calling because she has an appt on Monday and have been sick and have some questions . Please Call .  Thanks

## 2016-03-12 NOTE — Telephone Encounter (Signed)
Returned a call to pt & she stated she needs to change her appt because she has a cold 7 coughing a lot.  She states she was given Doxycycline 100mg  twice a day for 7 days; she has been on it 3 days at this time; pt will complete on Wednesday morning-03/17/16. Also, she has been taking Mucinex as needed and gargling with salt water.  Advised pt that the Doxycycline does interfere with Coumadin and can cause the INR to increase and bleeding & she verbalized understanding.  Advised pt to take Coumadin 1/2 tablet today, 1/2 tablet Saturday, and 1/2 tablet on Sunday (Fri & Sat is a normal 1/2 tablet); discussed with Pharmacist-Kelly. Also, pt states she is unsure if she will be feeling better on Monday for the appt, she states she will call to let us know.

## 2016-03-15 ENCOUNTER — Ambulatory Visit (INDEPENDENT_AMBULATORY_CARE_PROVIDER_SITE_OTHER): Payer: Medicare Other | Admitting: *Deleted

## 2016-03-15 DIAGNOSIS — I4819 Other persistent atrial fibrillation: Secondary | ICD-10-CM

## 2016-03-15 DIAGNOSIS — I481 Persistent atrial fibrillation: Secondary | ICD-10-CM | POA: Diagnosis not present

## 2016-03-15 DIAGNOSIS — I059 Rheumatic mitral valve disease, unspecified: Secondary | ICD-10-CM

## 2016-03-15 LAB — POCT INR: INR: 2.3

## 2016-03-16 ENCOUNTER — Encounter: Payer: Self-pay | Admitting: Internal Medicine

## 2016-04-06 ENCOUNTER — Ambulatory Visit (INDEPENDENT_AMBULATORY_CARE_PROVIDER_SITE_OTHER): Payer: Medicare Other

## 2016-04-06 DIAGNOSIS — Z95810 Presence of automatic (implantable) cardiac defibrillator: Secondary | ICD-10-CM | POA: Diagnosis not present

## 2016-04-06 DIAGNOSIS — I255 Ischemic cardiomyopathy: Secondary | ICD-10-CM | POA: Diagnosis not present

## 2016-04-06 NOTE — Progress Notes (Signed)
EPIC Encounter for ICM Monitoring  Patient Name: [name] is a 76 y.o. female Date: 04/06/2016 Primary Care Physican: Velna Hatchet, MD Primary Cardiologist:Crenshaw Electrophysiologist: Allred Dry Weight:145 lbs                               Heart Failure questions reviewed, pt asymptomatic.  She had a cold for a couple of weeks at the end of December.    Thoracic impedance abnormal since 03/31/2016  LABS: 12/09/2015 Creatinine 1.81, BUN 29, Potassium 4.0, Sodium 140 03/20/2015 Creatinine 1.53, BUN 21, Potassium 4.4, Sodium 139 12/11/2014 Creatinine 1.60, BUN 34, Potassium 3.8, Sodium 139 12/09/2014 Creatinine 1.72, BUN 32, Potassium 3.8, Sodium 138 11/11/2014 Creatinine 1.50, BUN 21, Potassium 4.2, Sodium 140  Recommendations:  Patient preferred check next week if she is able to get rid of the fluid without taking extra Furosemide.  Will recheck fluid levels on 2/8.  Encouraged her to take an extra Furosemide as prescribed if she becomes symptomatic for fluid  Follow-up plan: ICM clinic phone appointment on 04/15/2016.  Office appointment with Dr Rayann Heman 05/05/2016  Copy of ICM check sent to primary cardiologist and device physician.   3 month ICM trend: 04/06/2016   1 Year ICM trend:      Rosalene Billings, RN 04/06/2016 4:38 PM

## 2016-04-15 ENCOUNTER — Ambulatory Visit (INDEPENDENT_AMBULATORY_CARE_PROVIDER_SITE_OTHER): Payer: Medicare Other

## 2016-04-15 DIAGNOSIS — I255 Ischemic cardiomyopathy: Secondary | ICD-10-CM

## 2016-04-15 DIAGNOSIS — Z95810 Presence of automatic (implantable) cardiac defibrillator: Secondary | ICD-10-CM

## 2016-04-15 NOTE — Progress Notes (Signed)
EPIC Encounter for ICM Monitoring  Patient Name: [name] is a 76 y.o. female Date: 04/15/2016 Primary Care Physican: Velna Hatchet, MD Primary Cardiologist:Crenshaw Electrophysiologist: Allred Dry Weight:145lbs  Heart Failure questions reviewed, pt asymptomatic.  She had a cold for a couple of weeks at the end of December.    Thoracic impedance abnormalsince 03/31/2016  LABS: 12/09/2015 Creatinine 1.81, BUN 29, Potassium 4.0, Sodium 140 03/20/2015 Creatinine 1.53, BUN 21, Potassium 4.4, Sodium 139 12/11/2014 Creatinine 1.60, BUN 34, Potassium 3.8, Sodium 139 12/09/2014 Creatinine 1.72, BUN 32, Potassium 3.8, Sodium 138 11/11/2014 Creatinine 1.50, BUN 21, Potassium 4.2, Sodium 140.  Recommendations: No changes. Reminded to limit dietary salt intake to 2000 mg/day and fluid intake to < 2 liters/day. Encouraged to call for fluid symptoms.  Follow-up plan: ICM clinic phone appointment on 05/17/2016.  Copy of ICM check sent to primary cardiologist and device physician.   3 month ICM trend: 04/15/2016   1 Year ICM trend:     Rosalene Billings, RN 04/15/2016 5:07 PM

## 2016-04-19 ENCOUNTER — Encounter: Payer: Medicare Other | Admitting: Internal Medicine

## 2016-04-19 ENCOUNTER — Ambulatory Visit (INDEPENDENT_AMBULATORY_CARE_PROVIDER_SITE_OTHER): Payer: Medicare Other | Admitting: *Deleted

## 2016-04-19 DIAGNOSIS — I059 Rheumatic mitral valve disease, unspecified: Secondary | ICD-10-CM

## 2016-04-19 DIAGNOSIS — I255 Ischemic cardiomyopathy: Secondary | ICD-10-CM

## 2016-04-19 DIAGNOSIS — Z7901 Long term (current) use of anticoagulants: Secondary | ICD-10-CM

## 2016-04-19 DIAGNOSIS — I359 Nonrheumatic aortic valve disorder, unspecified: Secondary | ICD-10-CM | POA: Diagnosis not present

## 2016-04-19 DIAGNOSIS — I4891 Unspecified atrial fibrillation: Secondary | ICD-10-CM | POA: Diagnosis not present

## 2016-04-19 LAB — POCT INR: INR: 3.9

## 2016-04-26 ENCOUNTER — Encounter: Payer: Self-pay | Admitting: Internal Medicine

## 2016-05-05 ENCOUNTER — Ambulatory Visit (INDEPENDENT_AMBULATORY_CARE_PROVIDER_SITE_OTHER): Payer: Medicare Other | Admitting: Internal Medicine

## 2016-05-05 ENCOUNTER — Ambulatory Visit (INDEPENDENT_AMBULATORY_CARE_PROVIDER_SITE_OTHER): Payer: Medicare Other | Admitting: *Deleted

## 2016-05-05 VITALS — BP 126/70 | HR 63 | Ht 63.0 in | Wt 150.0 lb

## 2016-05-05 DIAGNOSIS — I4891 Unspecified atrial fibrillation: Secondary | ICD-10-CM | POA: Diagnosis not present

## 2016-05-05 DIAGNOSIS — Z7901 Long term (current) use of anticoagulants: Secondary | ICD-10-CM | POA: Diagnosis not present

## 2016-05-05 DIAGNOSIS — Z95810 Presence of automatic (implantable) cardiac defibrillator: Secondary | ICD-10-CM | POA: Diagnosis not present

## 2016-05-05 DIAGNOSIS — I38 Endocarditis, valve unspecified: Secondary | ICD-10-CM | POA: Diagnosis not present

## 2016-05-05 DIAGNOSIS — I359 Nonrheumatic aortic valve disorder, unspecified: Secondary | ICD-10-CM

## 2016-05-05 DIAGNOSIS — I059 Rheumatic mitral valve disease, unspecified: Secondary | ICD-10-CM

## 2016-05-05 DIAGNOSIS — I4819 Other persistent atrial fibrillation: Secondary | ICD-10-CM

## 2016-05-05 DIAGNOSIS — I255 Ischemic cardiomyopathy: Secondary | ICD-10-CM

## 2016-05-05 DIAGNOSIS — I481 Persistent atrial fibrillation: Secondary | ICD-10-CM

## 2016-05-05 DIAGNOSIS — I2589 Other forms of chronic ischemic heart disease: Secondary | ICD-10-CM | POA: Diagnosis not present

## 2016-05-05 LAB — CUP PACEART INCLINIC DEVICE CHECK
Brady Statistic RA Percent Paced: 46 %
Brady Statistic RV Percent Paced: 0.01 %
Date Time Interrogation Session: 20180228150445
HIGH POWER IMPEDANCE MEASURED VALUE: 56.25 Ohm
Implantable Lead Implant Date: 20140403
Implantable Lead Implant Date: 20140403
Implantable Lead Location: 753859
Implantable Lead Location: 753860
Lead Channel Impedance Value: 412.5 Ohm
Lead Channel Pacing Threshold Amplitude: 0.75 V
Lead Channel Pacing Threshold Amplitude: 0.75 V
Lead Channel Pacing Threshold Pulse Width: 0.5 ms
Lead Channel Pacing Threshold Pulse Width: 0.5 ms
Lead Channel Pacing Threshold Pulse Width: 0.5 ms
Lead Channel Sensing Intrinsic Amplitude: 12 mV
Lead Channel Sensing Intrinsic Amplitude: 4.2 mV
Lead Channel Setting Pacing Amplitude: 2 V
Lead Channel Setting Sensing Sensitivity: 0.5 mV
MDC IDC MSMT LEADCHNL RA PACING THRESHOLD AMPLITUDE: 0.75 V
MDC IDC MSMT LEADCHNL RV IMPEDANCE VALUE: 612.5 Ohm
MDC IDC MSMT LEADCHNL RV PACING THRESHOLD AMPLITUDE: 0.75 V
MDC IDC MSMT LEADCHNL RV PACING THRESHOLD PULSEWIDTH: 0.5 ms
MDC IDC PG IMPLANT DT: 20140403
MDC IDC PG SERIAL: 7080516
MDC IDC SET LEADCHNL RV PACING AMPLITUDE: 2.5 V
MDC IDC SET LEADCHNL RV PACING PULSEWIDTH: 0.5 ms

## 2016-05-05 LAB — POCT INR: INR: 3.5

## 2016-05-05 MED ORDER — PACERONE 200 MG PO TABS: 100.0000 mg | ORAL_TABLET | Freq: Every day | ORAL | 3 refills | Status: DC

## 2016-05-05 NOTE — Progress Notes (Signed)
Electrophysiology Office Note   Date:  05/05/2016   ID:  [name], DOB September 19, 1940, MRN [id number]  PCP:  Velna Hatchet, MD  Cardiologist:  Dr Stanford Breed Primary Electrophysiologist: Thompson Grayer, MD    Chief Complaint  Patient presents with  . Atrial Fibrillation     History of Present Illness: [name] is a 76 y.o. female who presents today for electrophysiology evaluation.   Doing well at this time.  She is maintaining sinus rhythm  SOB is stable. Today, she denies symptoms of palpitations, chest pain,  orthopnea, PND, lower extremity edema, claudication, dizziness, presyncope, syncope, bleeding, or neurologic sequela. The patient is tolerating medications without difficulties and is otherwise without complaint today.    Past Medical History:  Diagnosis Date  . AICD (automatic cardioverter/defibrillator) present    2014  . Anemia   . Arthritis    OSTEO  . Atrial fibrillation [facility])    A.fib/flutter: s/p MAZE 2008, DCCV 2011, on coumadin; previously on flecainide, but dc'd due to worsening EF. Tikosyn discontinued 06/2012 after cardiac arrest.  . Carcinoma in situ of vulva   . Chronic systolic CHF (congestive heart failure) (HCC)    EF 35%  . Coronary artery disease    Occluded LM 2/2 previous aortic root surgery; s/p 2v CABG 2010 LIMA to LAD, SVG to OM; Cath 03/11/12 patent SVG to OM, atretic LIMA to LAD, patent RCA, occluded native LM, EF 35%   . CVA (cerebral vascular accident) ([place])    2008 - felt due to oscillating calcium on aortic valve  . Diabetes mellitus   . Diverticulitis   . Dysrhythmia   . Fibroid   . Fracture of lumbar spine ([place])   . GERD (gastroesophageal reflux disease)   . Hypertension   . Ischemic cardiomyopathy    EF 35%  . Kidney stones   . Mitral regurgitation    s/p mitral valve repair 2008  . Osteoporosis   . Ovarian cyst, left 2008-2009  . Severe aortic stenosis    s/p homograft aortic root replacement 2008  . Stroke ([place])   .  Superficial venous thrombosis of upper extremity    06/2012 - LUE  . Thoracic aortic aneurysm [facility])    s/p resection and grafting 2008  . Ventricular fibrillation ([place])    VF arrest/VT/torsades 06/2012 with prolonged hosp with VDRF, cardiogen shock, asp PNA, encephalopathy, anemia, shock liver, hypernatremia - s/p ICD implantation 06/08/12.   Past Surgical History:  Procedure Laterality Date  . aneurysm and left sided maze  2008  . AORTIC ROOT REPLACEMENT  2008   homograft  . CARDIAC DEFIBRILLATOR PLACEMENT  06/08/2012   [employer] Fortify [address] DR  implanted by Dr Rayann Heman following a cardiac arrest  . CATARACT EXTRACTION    . CORONARY ARTERY BYPASS GRAFT  4/10   LIMA to LAD, SVG to OM  . EYE SURGERY    . FOOT SURGERY Left    removed neuroma  . IMPLANTABLE CARDIOVERTER DEFIBRILLATOR IMPLANT N/A 06/08/2012   Procedure: IMPLANTABLE CARDIOVERTER DEFIBRILLATOR IMPLANT;  Surgeon: Thompson Grayer, MD;  Location: [facility] CATH LAB;  Service: Cardiovascular;  Laterality: N/A;  . KYPHOPLASTY N/A 12/11/2014   Procedure: KYPHOPLASTY T-12;  Surgeon: Karie Chimera, MD;  Location: [facility] NEURO ORS;  Service: Neurosurgery;  Laterality: N/A;  KYPHOPLASTY T-12  . MITRAL VALVE REPLACEMENT  2008   due to severe MR  . Resection and grafting of ascending thoracic aortic    . RIGHT HEART CATHETERIZATION N/A 04/03/2012  Procedure: RIGHT HEART CATH;  Surgeon: [name], MD;  Location: [facility] CATH LAB;  Service: Cardiovascular;  Laterality: N/A;  . WIDE EXCISION OF VULVA     CA INSITU    Current Outpatient Prescriptions  Medication Sig Dispense Refill  . carvedilol (COREG) 12.5 MG tablet TAKE 1 TABLET (12.5 MG TOTAL) BY MOUTH 2 (TWO) TIMES DAILY WITH A MEAL. 180 tablet 0  . Cyanocobalamin (VITAMIN B-12) 2500 MCG SUBL Place 1 tablet under the tongue every Monday, Wednesday, and Friday.     . ergocalciferol (VITAMIN D2) 50000 UNITS capsule Take 50,000 Units by mouth once a week. Monday    . furosemide (LASIX) 20 MG  tablet Take 20 mg by mouth daily. Can take an extra 1 tablet daily as needed for swelling    . losartan (COZAAR) 50 MG tablet TAKE 1 TABLET (50 MG TOTAL) BY MOUTH DAILY. 90 tablet 2  . NON FORMULARY ([employer]) Take 500 mg of elemental calcium by mouth at bedtime    . PACERONE 200 MG tablet TAKE ONE TABLET BY MOUTH DAILY KEEP OFFICE VISIT 90 tablet 3  . Psyllium (METAMUCIL PO) Take 1 teaspoon by mouth daily with water    . Teriparatide, Recombinant, (FORTEO) 600 MCG/2.4ML SOLN Inject 20 mcg into the skin daily.    Marland Kitchen warfarin (COUMADIN) 3 MG tablet Take 1/2 to 1 tablet by mouth daily as directed by [facility] 30 tablet 3   No current facility-administered medications for this visit.     Allergies:   Metformin and related; Tikosyn [dofetilide]; and Caffeine   Social History:  The patient  reports that she quit smoking about 33 years ago. Her smoking use included Cigarettes. She has never used smokeless tobacco. She reports that she does not drink alcohol or use drugs.   Family History:  The patient's family history includes Breast cancer in her maternal aunt; Colon cancer in her paternal grandmother; Diabetes in her maternal uncle and paternal uncle; Emphysema in her father; Hypertension in her mother.    ROS:  Please see the history of present illness.   All other systems are reviewed and negative.    PHYSICAL EXAM: VS:  BP 126/70   Pulse 63   Ht 5\' 3"  (1.6 m)   Wt 150 lb (68 kg)   SpO2 96%   BMI 26.57 kg/m  , BMI Body mass index is 26.57 kg/m. GEN: Well nourished, well developed, in no acute distress  HEENT: normal  Neck: no JVD, carotid bruits, or masses Cardiac: RRR; 1/6 diastolic murmuc LUSB,no edema  Respiratory:  clear to auscultation bilaterally, normal work of breathing GI: soft, nontender, nondistended, + BS MS: no deformity or atrophy  Skin: warm and dry, device pocket is well healed Neuro:  Strength and sensation are intact Psych: euthymic mood, full  affect  Device interrogation is personally reviewed today in detail.  See PaceArt for details.  ekg today reveals sinus rhythm 63 bpm, PR 186 msec, Qtc 456 msec  Recent Labs: 12/08/2015: ALT 42; Hemoglobin 12.7; Platelets 327 02/02/2016: BUN 32; Creat 1.90; Potassium 4.6; Sodium 141; TSH 5.44    Lipid Panel     Component Value Date/Time   CHOL 169 04/04/2014 1019   TRIG 141.0 04/04/2014 1019   HDL 59.50 04/04/2014 1019   CHOLHDL 3 04/04/2014 1019   VLDL 28.2 04/04/2014 1019   LDLCALC 81 04/04/2014 1019   LDLDIRECT 132.3 10/17/2008 0827   Wt Readings from Last 3 Encounters:  05/05/16 150 lb (68  kg)  11/26/15 151 lb (68.5 kg)  04/09/15 156 lb (70.8 kg)    ASSESSMENT AND PLAN:  1. afib Well controlled with amiodarone Continue coumadin long term Will reduce amiodarone to 100mg  daily today Needs lfts/ tfts twice per year.  She prefers to have Dr Ardeth Perfect follow these.   2. S/p VF arrest Normal ICD function See Pace Art report No changes today  3. Valvular heart disease/ ischemic CM euvolemic is following Sharman Cheek to follow with monthly ICMs  Merlin Return to see me in 1 year Follow-up with Dr Stanford Breed as scheduled  Signed, Thompson Grayer, MD    [address] [place] [address] [phone] (office) [phone] (fax)

## 2016-05-05 NOTE — Patient Instructions (Signed)
Medication Instructions:  Your physician has recommended you make the following change in your medication:  1) Decrease Amiodarone to 100 mg daily    Labwork: None ordered   Testing/Procedures: None ordered   Follow-Up: Your physician wants you to follow-up in: 12 months with Dr Vallery Ridge will receive a reminder letter in the mail two months in advance. If you don't receive a letter, please call our office to schedule the follow-up appointment.  Remote monitoring is used to monitor your  ICD from home. This monitoring reduces the number of office visits required to check your device to one time per year. It allows [place] to keep an eye on the functioning of your device to ensure it is working properly. You are scheduled for a device check from home on 08/04/16. You may send your transmission at any time that day. If you have a wireless device, the transmission will be sent automatically. After your physician reviews your transmission, you will receive a postcard with your next transmission date.     Any Other Special Instructions Will Be Listed Below (If Applicable).     If you need a refill on your cardiac medications before your next appointment, please call your pharmacy.

## 2016-05-07 DIAGNOSIS — N390 Urinary tract infection, site not specified: Secondary | ICD-10-CM | POA: Diagnosis not present

## 2016-05-07 DIAGNOSIS — E119 Type 2 diabetes mellitus without complications: Secondary | ICD-10-CM | POA: Diagnosis not present

## 2016-05-07 DIAGNOSIS — M810 Age-related osteoporosis without current pathological fracture: Secondary | ICD-10-CM | POA: Diagnosis not present

## 2016-05-07 DIAGNOSIS — R946 Abnormal results of thyroid function studies: Secondary | ICD-10-CM | POA: Diagnosis not present

## 2016-05-07 DIAGNOSIS — R8299 Other abnormal findings in urine: Secondary | ICD-10-CM | POA: Diagnosis not present

## 2016-05-07 DIAGNOSIS — I10 Essential (primary) hypertension: Secondary | ICD-10-CM | POA: Diagnosis not present

## 2016-05-07 DIAGNOSIS — E11638 Type 2 diabetes mellitus with other oral complications: Secondary | ICD-10-CM | POA: Diagnosis not present

## 2016-05-14 DIAGNOSIS — N183 Chronic kidney disease, stage 3 (moderate): Secondary | ICD-10-CM | POA: Diagnosis not present

## 2016-05-14 DIAGNOSIS — I279 Pulmonary heart disease, unspecified: Secondary | ICD-10-CM | POA: Diagnosis not present

## 2016-05-14 DIAGNOSIS — Z0000 Encounter for general adult medical examination without abnormal findings: Secondary | ICD-10-CM | POA: Diagnosis not present

## 2016-05-14 DIAGNOSIS — E1121 Type 2 diabetes mellitus with diabetic nephropathy: Secondary | ICD-10-CM | POA: Diagnosis not present

## 2016-05-14 DIAGNOSIS — I5022 Chronic systolic (congestive) heart failure: Secondary | ICD-10-CM | POA: Diagnosis not present

## 2016-05-14 DIAGNOSIS — I130 Hypertensive heart and chronic kidney disease with heart failure and stage 1 through stage 4 chronic kidney disease, or unspecified chronic kidney disease: Secondary | ICD-10-CM | POA: Diagnosis not present

## 2016-05-14 DIAGNOSIS — Z1389 Encounter for screening for other disorder: Secondary | ICD-10-CM | POA: Diagnosis not present

## 2016-05-14 DIAGNOSIS — M4850XS Collapsed vertebra, not elsewhere classified, site unspecified, sequela of fracture: Secondary | ICD-10-CM | POA: Diagnosis not present

## 2016-05-14 DIAGNOSIS — D692 Other nonthrombocytopenic purpura: Secondary | ICD-10-CM | POA: Diagnosis not present

## 2016-05-14 DIAGNOSIS — Z6827 Body mass index (BMI) 27.0-27.9, adult: Secondary | ICD-10-CM | POA: Diagnosis not present

## 2016-05-14 DIAGNOSIS — I25728 Atherosclerosis of autologous artery coronary artery bypass graft(s) with other forms of angina pectoris: Secondary | ICD-10-CM | POA: Diagnosis not present

## 2016-05-14 DIAGNOSIS — I10 Essential (primary) hypertension: Secondary | ICD-10-CM | POA: Diagnosis not present

## 2016-05-14 DIAGNOSIS — E11638 Type 2 diabetes mellitus with other oral complications: Secondary | ICD-10-CM | POA: Diagnosis not present

## 2016-05-17 ENCOUNTER — Ambulatory Visit (INDEPENDENT_AMBULATORY_CARE_PROVIDER_SITE_OTHER): Payer: Medicare Other | Admitting: *Deleted

## 2016-05-17 ENCOUNTER — Ambulatory Visit (INDEPENDENT_AMBULATORY_CARE_PROVIDER_SITE_OTHER): Payer: Medicare Other

## 2016-05-17 DIAGNOSIS — Z7901 Long term (current) use of anticoagulants: Secondary | ICD-10-CM

## 2016-05-17 DIAGNOSIS — I359 Nonrheumatic aortic valve disorder, unspecified: Secondary | ICD-10-CM | POA: Diagnosis not present

## 2016-05-17 DIAGNOSIS — I059 Rheumatic mitral valve disease, unspecified: Secondary | ICD-10-CM

## 2016-05-17 DIAGNOSIS — I255 Ischemic cardiomyopathy: Secondary | ICD-10-CM

## 2016-05-17 DIAGNOSIS — I4891 Unspecified atrial fibrillation: Secondary | ICD-10-CM

## 2016-05-17 DIAGNOSIS — Z95810 Presence of automatic (implantable) cardiac defibrillator: Secondary | ICD-10-CM

## 2016-05-17 LAB — POCT INR: INR: 2.9

## 2016-05-18 NOTE — Progress Notes (Signed)
EPIC Encounter for ICM Monitoring  Patient Name: [name] is a 76 y.o. female Date: 05/18/2016 Primary Care Physican: Velna Hatchet, MD Primary Cardiologist:Crenshaw Electrophysiologist: Allred Dry Weight:145lbs      Heart Failure questions reviewed, pt asymptomatic.   Thoracic impedance normal.  Prescribed and confirmed dosage: Furosemide 20 mg by mouth daily. Can take an extra 1 tablet daily as needed for swelling  LABS: 05/07/2016 Creatinine 1.4,   BUN 33, Potassium 5.2, Sodium 138, EGFR 36.8 to 44.5 12/09/2015 Creatinine 1.81, BUN 29, Potassium 4.0, Sodium 140 03/20/2015 Creatinine 1.53, BUN 21, Potassium 4.4, Sodium 139 12/11/2014 Creatinine 1.60, BUN 34, Potassium 3.8, Sodium 139 12/09/2014 Creatinine 1.72, BUN 32, Potassium 3.8, Sodium 138 11/11/2014 Creatinine 1.50, BUN 21, Potassium 4.2, Sodium 140.  Recommendations: No changes. Reminded to limit dietary salt intake to 2000 mg/day and fluid intake to < 2 liters/day. Encouraged to call for fluid symptoms.  Follow-up plan: ICM clinic phone appointment on 06/21/2016.  Copy of ICM check sent to device physician.   3 month ICM trend: 05/17/2016   1 Year ICM trend:      Rosalene Billings, RN 05/18/2016 2:25 PM

## 2016-05-20 DIAGNOSIS — Z1212 Encounter for screening for malignant neoplasm of rectum: Secondary | ICD-10-CM | POA: Diagnosis not present

## 2016-05-31 ENCOUNTER — Ambulatory Visit (INDEPENDENT_AMBULATORY_CARE_PROVIDER_SITE_OTHER): Payer: Medicare Other | Admitting: *Deleted

## 2016-05-31 DIAGNOSIS — I359 Nonrheumatic aortic valve disorder, unspecified: Secondary | ICD-10-CM | POA: Diagnosis not present

## 2016-05-31 DIAGNOSIS — I059 Rheumatic mitral valve disease, unspecified: Secondary | ICD-10-CM

## 2016-05-31 DIAGNOSIS — Z7901 Long term (current) use of anticoagulants: Secondary | ICD-10-CM

## 2016-05-31 DIAGNOSIS — I255 Ischemic cardiomyopathy: Secondary | ICD-10-CM

## 2016-05-31 DIAGNOSIS — I4891 Unspecified atrial fibrillation: Secondary | ICD-10-CM

## 2016-05-31 LAB — POCT INR: INR: 2.3

## 2016-06-01 ENCOUNTER — Other Ambulatory Visit: Payer: Self-pay | Admitting: Internal Medicine

## 2016-06-21 ENCOUNTER — Telehealth: Payer: Self-pay

## 2016-06-21 ENCOUNTER — Ambulatory Visit (INDEPENDENT_AMBULATORY_CARE_PROVIDER_SITE_OTHER): Payer: Medicare Other | Admitting: *Deleted

## 2016-06-21 ENCOUNTER — Ambulatory Visit (INDEPENDENT_AMBULATORY_CARE_PROVIDER_SITE_OTHER): Payer: Medicare Other

## 2016-06-21 DIAGNOSIS — I4891 Unspecified atrial fibrillation: Secondary | ICD-10-CM | POA: Diagnosis not present

## 2016-06-21 DIAGNOSIS — Z7901 Long term (current) use of anticoagulants: Secondary | ICD-10-CM | POA: Diagnosis not present

## 2016-06-21 DIAGNOSIS — I359 Nonrheumatic aortic valve disorder, unspecified: Secondary | ICD-10-CM

## 2016-06-21 DIAGNOSIS — I255 Ischemic cardiomyopathy: Secondary | ICD-10-CM

## 2016-06-21 DIAGNOSIS — I059 Rheumatic mitral valve disease, unspecified: Secondary | ICD-10-CM | POA: Diagnosis not present

## 2016-06-21 DIAGNOSIS — M810 Age-related osteoporosis without current pathological fracture: Secondary | ICD-10-CM | POA: Diagnosis not present

## 2016-06-21 DIAGNOSIS — Z95810 Presence of automatic (implantable) cardiac defibrillator: Secondary | ICD-10-CM

## 2016-06-21 LAB — POCT INR: INR: 2.9

## 2016-06-21 NOTE — Telephone Encounter (Signed)
Remote ICM transmission received.  Attempted patient call and left detailed message regarding transmission and next ICM scheduled for 08/04/2016.  Advised to return call for any fluid symptoms or questions.

## 2016-06-21 NOTE — Progress Notes (Signed)
EPIC Encounter for ICM Monitoring  Patient Name: [name] is a 76 y.o. female Date: 06/21/2016 Primary Care Physican: Velna Hatchet, MD Primary Cardiologist:Crenshaw Electrophysiologist: Allred Dry Weight:unknown        Attempted call to patient and unable to reach.  Left detailed message regarding transmission.  Transmission reviewed.    Thoracic impedance normal but was abnormal suggesting fluid accumulation from 06/12/2016 to 06/15/2016.  Prescribed dosage: Furosemide 20 mg by mouth daily. Can take an extra 1 tablet daily as needed for swelling  LABS: 05/07/2016 Creatinine 1.4,   BUN 33, Potassium 5.2, Sodium 138, EGFR 36.8 to 44.5 12/09/2015 Creatinine 1.81, BUN 29, Potassium 4.0, Sodium 140 03/20/2015 Creatinine 1.53, BUN 21, Potassium 4.4, Sodium 139 12/11/2014 Creatinine 1.60, BUN 34, Potassium 3.8, Sodium 139 12/09/2014 Creatinine 1.72, BUN 32, Potassium 3.8, Sodium 138 11/11/2014 Creatinine 1.50, BUN 21, Potassium 4.2, Sodium 140.  Recommendations: Left voice mail with ICM number and encouraged to call for fluid symptoms.  Follow-up plan: ICM clinic phone appointment on 08/04/2016.    Copy of ICM check sent to primary cardiologist and device physician.   3 month ICM trend: 06/21/2016   1 Year ICM trend:      Rosalene Billings, RN 06/21/2016 10:03 AM

## 2016-07-19 ENCOUNTER — Ambulatory Visit (INDEPENDENT_AMBULATORY_CARE_PROVIDER_SITE_OTHER): Payer: Medicare Other

## 2016-07-19 DIAGNOSIS — I4891 Unspecified atrial fibrillation: Secondary | ICD-10-CM

## 2016-07-19 DIAGNOSIS — I255 Ischemic cardiomyopathy: Secondary | ICD-10-CM

## 2016-07-19 DIAGNOSIS — I359 Nonrheumatic aortic valve disorder, unspecified: Secondary | ICD-10-CM | POA: Diagnosis not present

## 2016-07-19 DIAGNOSIS — Z7901 Long term (current) use of anticoagulants: Secondary | ICD-10-CM | POA: Diagnosis not present

## 2016-07-19 DIAGNOSIS — I059 Rheumatic mitral valve disease, unspecified: Secondary | ICD-10-CM | POA: Diagnosis not present

## 2016-07-19 DIAGNOSIS — Z1231 Encounter for screening mammogram for malignant neoplasm of breast: Secondary | ICD-10-CM | POA: Diagnosis not present

## 2016-07-19 LAB — POCT INR: INR: 2.1

## 2016-08-04 ENCOUNTER — Ambulatory Visit (INDEPENDENT_AMBULATORY_CARE_PROVIDER_SITE_OTHER): Payer: Medicare Other | Admitting: *Deleted

## 2016-08-04 DIAGNOSIS — I255 Ischemic cardiomyopathy: Secondary | ICD-10-CM

## 2016-08-04 DIAGNOSIS — Z95810 Presence of automatic (implantable) cardiac defibrillator: Secondary | ICD-10-CM

## 2016-08-05 NOTE — Progress Notes (Signed)
EPIC Encounter for ICM Monitoring  Patient Name: [name] is a 76 y.o. female Date: 08/05/2016 Primary Care Physican: Velna Hatchet, MD Primary Cardiologist:Crenshaw Electrophysiologist: Allred Dry Weight:unknown      Heart Failure questions reviewed, pt asymptomatic    Thoracic impedance normal but was abnormal suggesting fluid accumulation from 07/24/2016 - 07/31/2016.  Prescribed dosage: Furosemide 20 mg by mouth daily. Can take an extra 1 tablet daily as needed for swelling  LABS: 05/07/2016 Creatinine 1.4, BUN 33, Potassium 5.2, Sodium 138, EGFR 36.8 to 44.5 12/09/2015 Creatinine 1.81, BUN 29, Potassium 4.0, Sodium 140 03/20/2015 Creatinine 1.53, BUN 21, Potassium 4.4, Sodium 139 12/11/2014 Creatinine 1.60, BUN 34, Potassium 3.8, Sodium 139 12/09/2014 Creatinine 1.72, BUN 32, Potassium 3.8, Sodium 138 11/11/2014 Creatinine 1.50, BUN 21, Potassium 4.2, Sodium 140.  Recommendations: No changes. Discussed to limit salt intake to 2000 mg/day and fluid intake to < 2 liters/day.  Encouraged to call for fluid symptoms or use local ER for any urgent symptoms.  Follow-up plan: ICM clinic phone appointment on 09/07/2016.    Copy of ICM check sent to device physician.   3 month ICM trend: 08/04/2016   1 Year ICM trend:      Rosalene Billings, RN 08/05/2016 2:20 PM

## 2016-08-06 NOTE — Progress Notes (Signed)
Remote ICD transmission.   

## 2016-08-09 DIAGNOSIS — E119 Type 2 diabetes mellitus without complications: Secondary | ICD-10-CM | POA: Diagnosis not present

## 2016-08-09 DIAGNOSIS — M810 Age-related osteoporosis without current pathological fracture: Secondary | ICD-10-CM | POA: Diagnosis not present

## 2016-08-09 DIAGNOSIS — D692 Other nonthrombocytopenic purpura: Secondary | ICD-10-CM | POA: Diagnosis not present

## 2016-08-09 DIAGNOSIS — R740 Nonspecific elevation of levels of transaminase and lactic acid dehydrogenase [LDH]: Secondary | ICD-10-CM | POA: Diagnosis not present

## 2016-08-09 DIAGNOSIS — I480 Paroxysmal atrial fibrillation: Secondary | ICD-10-CM | POA: Diagnosis not present

## 2016-08-09 DIAGNOSIS — Z7901 Long term (current) use of anticoagulants: Secondary | ICD-10-CM | POA: Diagnosis not present

## 2016-08-09 DIAGNOSIS — I10 Essential (primary) hypertension: Secondary | ICD-10-CM | POA: Diagnosis not present

## 2016-08-09 DIAGNOSIS — Z6827 Body mass index (BMI) 27.0-27.9, adult: Secondary | ICD-10-CM | POA: Diagnosis not present

## 2016-08-09 DIAGNOSIS — E11638 Type 2 diabetes mellitus with other oral complications: Secondary | ICD-10-CM | POA: Diagnosis not present

## 2016-08-09 DIAGNOSIS — K219 Gastro-esophageal reflux disease without esophagitis: Secondary | ICD-10-CM | POA: Diagnosis not present

## 2016-08-12 ENCOUNTER — Other Ambulatory Visit: Payer: Self-pay | Admitting: Cardiology

## 2016-08-13 ENCOUNTER — Encounter: Payer: Self-pay | Admitting: Cardiology

## 2016-08-16 ENCOUNTER — Ambulatory Visit (INDEPENDENT_AMBULATORY_CARE_PROVIDER_SITE_OTHER): Payer: Medicare Other | Admitting: *Deleted

## 2016-08-16 DIAGNOSIS — I359 Nonrheumatic aortic valve disorder, unspecified: Secondary | ICD-10-CM

## 2016-08-16 DIAGNOSIS — I059 Rheumatic mitral valve disease, unspecified: Secondary | ICD-10-CM | POA: Diagnosis not present

## 2016-08-16 DIAGNOSIS — Z7901 Long term (current) use of anticoagulants: Secondary | ICD-10-CM | POA: Diagnosis not present

## 2016-08-16 DIAGNOSIS — I4891 Unspecified atrial fibrillation: Secondary | ICD-10-CM | POA: Diagnosis not present

## 2016-08-16 DIAGNOSIS — I255 Ischemic cardiomyopathy: Secondary | ICD-10-CM

## 2016-08-16 LAB — POCT INR: INR: 2

## 2016-08-17 LAB — CUP PACEART REMOTE DEVICE CHECK
Battery Voltage: 2.98 V
Brady Statistic AP VP Percent: 1 %
Brady Statistic AP VS Percent: 73 %
Brady Statistic AS VP Percent: 1 %
Brady Statistic AS VS Percent: 27 %
Brady Statistic RV Percent Paced: 1 %
HighPow Impedance: 55 Ohm
HighPow Impedance: 55 Ohm
Implantable Lead Implant Date: 20140403
Implantable Lead Location: 753859
Implantable Pulse Generator Implant Date: 20140403
Lead Channel Impedance Value: 430 Ohm
Lead Channel Pacing Threshold Amplitude: 0.75 V
Lead Channel Pacing Threshold Pulse Width: 0.5 ms
Lead Channel Setting Pacing Amplitude: 2.5 V
MDC IDC LEAD IMPLANT DT: 20140403
MDC IDC LEAD LOCATION: 753860
MDC IDC MSMT BATTERY REMAINING LONGEVITY: 56 mo
MDC IDC MSMT BATTERY REMAINING PERCENTAGE: 58 %
MDC IDC MSMT LEADCHNL RA PACING THRESHOLD PULSEWIDTH: 0.5 ms
MDC IDC MSMT LEADCHNL RA SENSING INTR AMPL: 4.8 mV
MDC IDC MSMT LEADCHNL RV IMPEDANCE VALUE: 650 Ohm
MDC IDC MSMT LEADCHNL RV PACING THRESHOLD AMPLITUDE: 0.75 V
MDC IDC MSMT LEADCHNL RV SENSING INTR AMPL: 12 mV
MDC IDC SESS DTM: 20180530080635
MDC IDC SET LEADCHNL RA PACING AMPLITUDE: 2 V
MDC IDC SET LEADCHNL RV PACING PULSEWIDTH: 0.5 ms
MDC IDC SET LEADCHNL RV SENSING SENSITIVITY: 0.5 mV
MDC IDC STAT BRADY RA PERCENT PACED: 72 %
Pulse Gen Serial Number: 7080516

## 2016-08-27 ENCOUNTER — Encounter: Payer: Self-pay | Admitting: Cardiology

## 2016-08-30 ENCOUNTER — Ambulatory Visit (INDEPENDENT_AMBULATORY_CARE_PROVIDER_SITE_OTHER): Payer: Medicare Other | Admitting: Pharmacist

## 2016-08-30 DIAGNOSIS — I4891 Unspecified atrial fibrillation: Secondary | ICD-10-CM

## 2016-08-30 DIAGNOSIS — Z7901 Long term (current) use of anticoagulants: Secondary | ICD-10-CM | POA: Diagnosis not present

## 2016-08-30 DIAGNOSIS — I359 Nonrheumatic aortic valve disorder, unspecified: Secondary | ICD-10-CM

## 2016-08-30 DIAGNOSIS — I255 Ischemic cardiomyopathy: Secondary | ICD-10-CM

## 2016-08-30 DIAGNOSIS — I059 Rheumatic mitral valve disease, unspecified: Secondary | ICD-10-CM | POA: Diagnosis not present

## 2016-08-30 LAB — POCT INR: INR: 2.3

## 2016-08-31 ENCOUNTER — Telehealth: Payer: Self-pay

## 2016-08-31 NOTE — Telephone Encounter (Signed)
Returned call to patient per her request by voice mail message.  She stated they are having problems with land  line and Merlin Monitor.  The land line says line in use numerous times night and day even though they use a phone splitter for monitor and land line. They tried a new splitter and it did not resolve the problem.  Advised her to call [employer] to help with trouble shooting and explained there is an adapter available so it will use a tower signal versus the land line.  She will let me know if there is any further trouble.

## 2016-09-03 DIAGNOSIS — N200 Calculus of kidney: Secondary | ICD-10-CM | POA: Diagnosis not present

## 2016-09-03 DIAGNOSIS — R3121 Asymptomatic microscopic hematuria: Secondary | ICD-10-CM | POA: Diagnosis not present

## 2016-09-03 DIAGNOSIS — R350 Frequency of micturition: Secondary | ICD-10-CM | POA: Diagnosis not present

## 2016-09-07 ENCOUNTER — Telehealth: Payer: Self-pay | Admitting: Cardiology

## 2016-09-07 NOTE — Telephone Encounter (Signed)
Spoke with pt and reminded pt of remote transmission that is due today. Pt verbalized understanding.   

## 2016-09-10 NOTE — Progress Notes (Signed)
No ICM remote transmission received for 09/07/2016 and next ICM transmission scheduled for 09/20/2016.

## 2016-09-20 ENCOUNTER — Ambulatory Visit (INDEPENDENT_AMBULATORY_CARE_PROVIDER_SITE_OTHER): Payer: Medicare Other

## 2016-09-20 ENCOUNTER — Ambulatory Visit (INDEPENDENT_AMBULATORY_CARE_PROVIDER_SITE_OTHER): Payer: Medicare Other | Admitting: *Deleted

## 2016-09-20 DIAGNOSIS — I255 Ischemic cardiomyopathy: Secondary | ICD-10-CM | POA: Diagnosis not present

## 2016-09-20 DIAGNOSIS — I359 Nonrheumatic aortic valve disorder, unspecified: Secondary | ICD-10-CM

## 2016-09-20 DIAGNOSIS — Z95810 Presence of automatic (implantable) cardiac defibrillator: Secondary | ICD-10-CM

## 2016-09-20 DIAGNOSIS — I4891 Unspecified atrial fibrillation: Secondary | ICD-10-CM | POA: Diagnosis not present

## 2016-09-20 DIAGNOSIS — I059 Rheumatic mitral valve disease, unspecified: Secondary | ICD-10-CM

## 2016-09-20 DIAGNOSIS — Z7901 Long term (current) use of anticoagulants: Secondary | ICD-10-CM

## 2016-09-20 LAB — POCT INR: INR: 2.2

## 2016-09-20 NOTE — Progress Notes (Signed)
EPIC Encounter for ICM Monitoring  Patient Name: [name] is a 76 y.o. female Date: 09/20/2016 Primary Care Physican: Velna Hatchet, MD Primary Cardiologist:Crenshaw Electrophysiologist: Allred Dry Weight:unknown                                              Heart Failure questions reviewed, pt asymptomatic    Thoracic impedance normal but was abnormal suggesting fluid accumulation from 09/05/2016 to 09/15/2016  Prescribed dosage: Furosemide 20 mg by mouth daily. Can take an extra 1 tablet daily as needed for swelling  LABS: 05/07/2016 Creatinine 1.4, BUN 33, Potassium 5.2, Sodium 138, EGFR 36.8 to 44.5 12/09/2015 Creatinine 1.81, BUN 29, Potassium 4.0, Sodium 140  03/20/2015 Creatinine 1.53, BUN 21, Potassium 4.4, Sodium 139 12/11/2014 Creatinine 1.60, BUN 34, Potassium 3.8, Sodium 139 12/09/2014 Creatinine 1.72, BUN 32, Potassium 3.8, Sodium 138 11/11/2014 Creatinine 1.50, BUN 21, Potassium 4.2, Sodium 140.  Recommendations: No changes.  Patient reported salting summer foods such as tomatoes and corn.  She also normally adds a little salt when cooking foods. Reminded her to limit salt intake to 2000 mg/day.   Encouraged to call for fluid symptoms.  Follow-up plan: ICM clinic phone appointment on 10/21/2016.    Copy of ICM check sent to cardiologist and device physician.   3 month ICM trend: 09/20/2016   1 Year ICM trend:      Rosalene Billings, RN 09/20/2016 2:49 PM

## 2016-10-08 ENCOUNTER — Encounter: Payer: Self-pay | Admitting: Cardiology

## 2016-10-08 NOTE — Telephone Encounter (Signed)
This encounter was created in error - please disregard.

## 2016-10-08 NOTE — Telephone Encounter (Signed)
Tried to call pt she states that she only wants to speak with [name]

## 2016-10-08 NOTE — Telephone Encounter (Signed)
New Message  Pt call requesting to speak with RN. Pt states she had some questions before scheduling an appt. Please call back to discuss

## 2016-10-18 ENCOUNTER — Ambulatory Visit (INDEPENDENT_AMBULATORY_CARE_PROVIDER_SITE_OTHER): Payer: Medicare Other | Admitting: Pharmacist

## 2016-10-18 DIAGNOSIS — Z7901 Long term (current) use of anticoagulants: Secondary | ICD-10-CM

## 2016-10-18 DIAGNOSIS — I4891 Unspecified atrial fibrillation: Secondary | ICD-10-CM | POA: Diagnosis not present

## 2016-10-18 DIAGNOSIS — I359 Nonrheumatic aortic valve disorder, unspecified: Secondary | ICD-10-CM | POA: Diagnosis not present

## 2016-10-18 DIAGNOSIS — I059 Rheumatic mitral valve disease, unspecified: Secondary | ICD-10-CM | POA: Diagnosis not present

## 2016-10-18 DIAGNOSIS — I255 Ischemic cardiomyopathy: Secondary | ICD-10-CM

## 2016-10-18 LAB — POCT INR: INR: 2.3

## 2016-10-19 ENCOUNTER — Telehealth: Payer: Self-pay

## 2016-10-19 NOTE — Telephone Encounter (Signed)
Returned patient call as requested by voice mail message.  Patient reported the monitor keeps showing line in use and cannot understand why.  Advised to call [employer] and ask the questions regarding the monitor.  She is using land line monitor.  She does have a cell phone signal in house and explained there is an adapter that Merlin can send so it uses cell phone towers and would not need to use a land line.  She will have her husband call.  She had the number.

## 2016-10-21 ENCOUNTER — Ambulatory Visit (INDEPENDENT_AMBULATORY_CARE_PROVIDER_SITE_OTHER): Payer: Medicare Other

## 2016-10-21 DIAGNOSIS — I255 Ischemic cardiomyopathy: Secondary | ICD-10-CM | POA: Diagnosis not present

## 2016-10-21 DIAGNOSIS — Z95810 Presence of automatic (implantable) cardiac defibrillator: Secondary | ICD-10-CM | POA: Diagnosis not present

## 2016-10-21 NOTE — Progress Notes (Signed)
EPIC Encounter for ICM Monitoring  Patient Name: [name] is a 76 y.o. female Date: 10/21/2016 Primary Care Physican: Velna Hatchet, MD Primary Cardiologist:Crenshaw Electrophysiologist: Allred Dry Weight:unknown       Heart Failure questions reviewed, pt asymptomatic.   Thoracic impedance abnormal suggesting fluid accumulation since 10/15/2016 but trending just below baseline.  Prescribed dosage: Furosemide 20 mg by mouth daily. Can take an extra 1 tablet daily as needed for swelling  LABS: 05/07/2016 Creatinine 1.4, BUN 33, Potassium 5.2, Sodium 138, EGFR 36.8 to 44.5 12/09/2015 Creatinine 1.81, BUN 29, Potassium 4.0, Sodium 140  03/20/2015 Creatinine 1.53, BUN 21, Potassium 4.4, Sodium 139 12/11/2014 Creatinine 1.60, BUN 34, Potassium 3.8, Sodium 139 12/09/2014 Creatinine 1.72, BUN 32, Potassium 3.8, Sodium 138 11/11/2014 Creatinine 1.50, BUN 21, Potassium 4.2, Sodium 140.  Recommendations:   She stated she is feeling fine and will take extra Furosemide as needed.    Follow-up plan: ICM clinic phone appointment on 11/04/2016 to recheck fluid levels.  She said she will be out of town next week.   Copy of ICM check sent to Dr Stanford Breed and Dr Rayann Heman   3 month ICM trend: 10/21/2016   1 Year ICM trend:      Rosalene Billings, RN 10/21/2016 9:10 AM

## 2016-11-02 ENCOUNTER — Encounter: Payer: Self-pay | Admitting: Cardiology

## 2016-11-04 ENCOUNTER — Telehealth: Payer: Self-pay

## 2016-11-04 ENCOUNTER — Ambulatory Visit (INDEPENDENT_AMBULATORY_CARE_PROVIDER_SITE_OTHER): Payer: Self-pay

## 2016-11-04 DIAGNOSIS — I255 Ischemic cardiomyopathy: Secondary | ICD-10-CM

## 2016-11-04 DIAGNOSIS — Z95810 Presence of automatic (implantable) cardiac defibrillator: Secondary | ICD-10-CM

## 2016-11-04 NOTE — Telephone Encounter (Signed)
Spoke to patient regarding HF Research Studies. Appt given for Sept 5th 11am. Garage code given. Very pleasant.

## 2016-11-04 NOTE — Progress Notes (Signed)
EPIC Encounter for ICM Monitoring  Patient Name: [name] is a 76 y.o. female Date: 11/04/2016 Primary Care Physican: Velna Hatchet, MD Primary Cardiologist:Crenshaw Electrophysiologist: Allred Dry Weight:unknown       Heart Failure questions reviewed, pt asymptomatic.   Thoracic impedance almost back at baseline since last ICM transmission 8/16.  Prescribed dosage: Furosemide 20 mg by mouth daily. Can take an extra 1 tablet daily as needed for swelling  LABS: 05/07/2016 Creatinine 1.4, BUN 33, Potassium 5.2, Sodium 138, EGFR 36.8 to 44.5 12/09/2015 Creatinine 1.81, BUN 29, Potassium 4.0, Sodium 140  03/20/2015 Creatinine 1.53, BUN 21, Potassium 4.4, Sodium 139 12/11/2014 Creatinine 1.60, BUN 34, Potassium 3.8, Sodium 139 12/09/2014 Creatinine 1.72, BUN 32, Potassium 3.8, Sodium 138 11/11/2014 Creatinine 1.50, BUN 21, Potassium 4.2, Sodium 140.  Recommendations: No changes.  Encouraged to call for fluid symptoms.  Follow-up plan: ICM clinic phone appointment on 11/22/2016.  Office appointment scheduled 11/30/2016 with Dr. Stanford Breed.  Copy of ICM check sent to Dr. Rayann Heman.   3 month ICM trend: 11/04/2016   1 Year ICM trend:      Rosalene Billings, RN 11/04/2016 1:08 PM

## 2016-11-04 NOTE — Telephone Encounter (Signed)
Returned call to patient as requested by voice mail.  She said she was trying to call Desmond Dike regarding a research program.  She has tried to call her back numerous times but unable to get through because line is busy.  Advised Deneise Lever will call her back regarding the study.  Requested she send remote transmission today to recheck fluid levels.

## 2016-11-09 ENCOUNTER — Other Ambulatory Visit: Payer: Self-pay | Admitting: Cardiology

## 2016-11-09 NOTE — Telephone Encounter (Signed)
Rx(s) sent to pharmacy electronically. MD OV 11/30/16

## 2016-11-10 ENCOUNTER — Other Ambulatory Visit: Payer: Self-pay | Admitting: *Deleted

## 2016-11-10 ENCOUNTER — Telehealth: Payer: Self-pay | Admitting: *Deleted

## 2016-11-10 ENCOUNTER — Encounter: Payer: Self-pay | Admitting: *Deleted

## 2016-11-10 ENCOUNTER — Telehealth: Payer: Self-pay

## 2016-11-10 ENCOUNTER — Ambulatory Visit (HOSPITAL_COMMUNITY)
Admission: RE | Admit: 2016-11-10 | Discharge: 2016-11-10 | Disposition: A | Discharge status: Home / Self Care | Payer: Medicare Other | Source: Ambulatory Visit | Attending: Internal Medicine | Admitting: Internal Medicine

## 2016-11-10 DIAGNOSIS — I509 Heart failure, unspecified: Secondary | ICD-10-CM

## 2016-11-10 DIAGNOSIS — Z006 Encounter for examination for normal comparison and control in clinical research program: Secondary | ICD-10-CM

## 2016-11-10 DIAGNOSIS — I517 Cardiomegaly: Secondary | ICD-10-CM | POA: Insufficient documentation

## 2016-11-10 DIAGNOSIS — I340 Nonrheumatic mitral (valve) insufficiency: Secondary | ICD-10-CM | POA: Diagnosis not present

## 2016-11-10 DIAGNOSIS — Z953 Presence of xenogenic heart valve: Secondary | ICD-10-CM | POA: Insufficient documentation

## 2016-11-10 LAB — ECHOCARDIOGRAM COMPLETE
AO mean calculated velocity dopler: 122 cm/s
AOVTI: 37.3 cm
AV Area VTI index: 0.81 cm2/m2
AV Area mean vel: 1.25 cm2
AV Mean grad: 7 mmHg
AV Peak grad: 13 mmHg
AV VEL mean LVOT/AV: 0.55
AV area mean vel ind: 0.73 cm2/m2
AV pk vel: 179 cm/s
AVA: 1.38 cm2
AVAREAVTI: 1.27 cm2
Ao pk vel: 0.56 m/s
Area-P 1/2: 2.24 cm2
CHL CUP AV PEAK INDEX: 0.74
CHL CUP AV VALUE AREA INDEX: 0.81
CHL CUP AV VEL: 1.38
CHL CUP DOP CALC LVOT VTI: 22.6 cm
EERAT: 14.12
EWDT: 352 ms
FS: 31 % (ref 28–44)
IV/PV OW: 0.9
LA diam end sys: 47 mm
LA vol A4C: 52.3 ml
LADIAMINDEX: 2.75 cm/m2
LASIZE: 47 mm
LAVOL: 55.7 mL
LAVOLIN: 32.6 mL/m2
LV TDI E'MEDIAL: 6.74
LVEEAVG: 14.12
LVEEMED: 14.12
LVELAT: 11.4 cm/s
LVOT area: 2.27 cm2
LVOT diameter: 17 mm
LVOT peak grad rest: 4 mmHg
LVOT peak vel: 100 cm/s
LVOTSV: 51 mL
LVOTVTI: 0.61 cm
Lateral S' vel: 7.51 cm/s
MV Dec: 352
MV Peak grad: 10 mmHg
MV pk E vel: 161 m/s
MVPKAVEL: 67.5 m/s
MVSPHT: 105 ms
PW: 9.24 mm — AB (ref 0.6–1.1)
RV TAPSE: 13.8 mm
TDI e' lateral: 11.4

## 2016-11-10 NOTE — Progress Notes (Signed)
Patient present with her husband to screen for Beat HF study. Referral by Dr. Rayann Heman. Study discussed at length, study video viewed, consent read, questions answered before any study procedures were preformed. Vital signs, 6 minute hall walk and screening echocardiogram performed. Patient is right handed. Patient will await phone call with echo results as study required EF 35% or less. Patient and husband very pleasant.

## 2016-11-10 NOTE — Telephone Encounter (Signed)
Call to patient to give Echo results, EF 55%.  Subject understands that this is to high to participate in Heart Failure study.

## 2016-11-10 NOTE — Progress Notes (Signed)
  Echocardiogram 2D Echocardiogram has been performed.  [name] 11/10/2016, 3:03 PM

## 2016-11-18 ENCOUNTER — Encounter: Payer: Self-pay | Admitting: Cardiology

## 2016-11-18 NOTE — Telephone Encounter (Signed)
She says she have an appointment with Dr Stanford Breed at the end of this month.She says it is something she wants to go over with you please.

## 2016-11-18 NOTE — Telephone Encounter (Signed)
This encounter was created in error - please disregard.

## 2016-11-18 NOTE — Progress Notes (Signed)
HPI: FU PAF, AVR, CM, H/O cardiac arrest s/p ICD.  History of severe AS, mitral regurgitation, aneurysm of the ascending thoracic aorta (status post aortic valve replacement, mitral valve repair, resection and grafting of the ascending thoracic aortic aneurysm on Jul 07, 2006), history of paroxysmal atrial fibrillation (status post maze procedure on Jul 07, 2006). Followup catheterization performed secondary to elevated troponin and recurrent atrial fibrillation revealed an ejection fraction of 25-30% and occlusion of the reimplanted left main. The patient subsequently underwent redo coronary artery bypass and graft with a LIMA to the LAD and a saphenous vein graft to the OM. She also had an epicardial lead placed if she required biventricular pacing in the future. Cardiac catheterization repeated in January of 2014 because of reduced LV function. The left main was occluded. The LAD, circumflex and right coronary artery were patent. The LIMA to the LAD was patent but atretic. Saphenous vein graft to the obtuse marginal was patent. This graft filled the obtuse marginal and ramus intermedius and the entire LAD filled retrograde from this graft. Ejection fraction was 35%. Patient referred to Dr. Rayann Heman for consideration of ICD; also placed on tikosyn for atrial fibrillation. Patient then admitted in March of 2014 following a ventricular fibrillation arrest. She required a prolonged resuscitation. Her QT was prolonged and her tikosyn DCed. Her hospital course was complicated by a DVT in her left arm. She ultimately had a [employer]. Jude ICD implanted. Amiodarone initiated. Echocardiogram September 2018 showed normal LV function, prior aortic valve replacement with mean gradient 7 mmHg and trace aortic insufficiency, prior mitral valve repair with mild mitral regurgitation and mild left atrial enlargement. Since I last saw her, the patient denies any dyspnea on exertion, orthopnea, PND, pedal edema, palpitations,  syncope or chest pain.   Current Outpatient Prescriptions  Medication Sig Dispense Refill  . carvedilol (COREG) 12.5 MG tablet TAKE 1 TABLET (12.5 MG TOTAL) BY MOUTH 2 (TWO) TIMES DAILY WITH A MEAL. 180 tablet 2  . Cyanocobalamin (VITAMIN B-12) 2500 MCG SUBL Place 1 tablet under the tongue every Monday, Wednesday, and Friday.     . ergocalciferol (VITAMIN D2) 50000 UNITS capsule Take 50,000 Units by mouth once a week. Monday    . furosemide (LASIX) 20 MG tablet Take 20 mg by mouth daily. Can take an extra 1 tablet daily as needed for swelling    . losartan (COZAAR) 50 MG tablet TAKE 1 TABLET (50 MG TOTAL) BY MOUTH DAILY. 90 tablet 0  . NON FORMULARY ([employer] Brand) Take 500 mg of elemental calcium by mouth at bedtime    . PACERONE 200 MG tablet Take 0.5 tablets (100 mg total) by mouth daily. 90 tablet 3  . Psyllium (METAMUCIL PO) Take 1 teaspoon by mouth daily with water    . Teriparatide, Recombinant, (FORTEO) 600 MCG/2.4ML SOLN Inject 20 mcg into the skin daily.    Marland Kitchen warfarin (COUMADIN) 3 MG tablet TAKE 1/2 TO 1 TABLET BY MOUTH DAILY AS DIRECTED BY COUMADIN CLINIC 30 tablet 3   No current facility-administered medications for this visit.      Past Medical History:  Diagnosis Date  . AICD (automatic cardioverter/defibrillator) present    2014  . Anemia   . Arthritis    OSTEO  . Atrial fibrillation [facility])    A.fib/flutter: s/p MAZE 2008, DCCV 2011, on coumadin; previously on flecainide, but dc'd due to worsening EF. Tikosyn discontinued 06/2012 after cardiac arrest.  . Carcinoma in situ of vulva   .  Chronic systolic CHF (congestive heart failure) (HCC)    EF 35%  . Coronary artery disease    Occluded LM 2/2 previous aortic root surgery; s/p 2v CABG 2010 LIMA to LAD, SVG to OM; Cath 03/11/12 patent SVG to OM, atretic LIMA to LAD, patent RCA, occluded native LM, EF 35%   . CVA (cerebral vascular accident) ([place])    2008 - felt due to oscillating calcium on aortic valve  . Diabetes mellitus     . Diverticulitis   . Dysrhythmia   . Fibroid   . Fracture of lumbar spine ([place])   . GERD (gastroesophageal reflux disease)   . Hypertension   . Ischemic cardiomyopathy    EF 35%  . Kidney stones   . Mitral regurgitation    s/p mitral valve repair 2008  . Osteoporosis   . Ovarian cyst, left 2008-2009  . Severe aortic stenosis    s/p homograft aortic root replacement 2008  . Stroke ([place])   . Superficial venous thrombosis of upper extremity    06/2012 - LUE  . Thoracic aortic aneurysm [facility])    s/p resection and grafting 2008  . Ventricular fibrillation ([place])    VF arrest/VT/torsades 06/2012 with prolonged hosp with VDRF, cardiogen shock, asp PNA, encephalopathy, anemia, shock liver, hypernatremia - s/p ICD implantation 06/08/12.    Past Surgical History:  Procedure Laterality Date  . aneurysm and left sided maze  2008  . AORTIC ROOT REPLACEMENT  2008   homograft  . CARDIAC DEFIBRILLATOR PLACEMENT  06/08/2012   St. Jude Medical Fortify Assura DR  implanted by Dr Rayann Heman following a cardiac arrest  . CATARACT EXTRACTION    . CORONARY ARTERY BYPASS GRAFT  4/10   LIMA to LAD, SVG to OM  . EYE SURGERY    . FOOT SURGERY Left    removed neuroma  . IMPLANTABLE CARDIOVERTER DEFIBRILLATOR IMPLANT N/A 06/08/2012   Procedure: IMPLANTABLE CARDIOVERTER DEFIBRILLATOR IMPLANT;  Surgeon: Thompson Grayer, MD;  Location: [facility] CATH LAB;  Service: Cardiovascular;  Laterality: N/A;  . KYPHOPLASTY N/A 12/11/2014   Procedure: KYPHOPLASTY T-12;  Surgeon: Karie Chimera, MD;  Location: [place] NEURO ORS;  Service: Neurosurgery;  Laterality: N/A;  KYPHOPLASTY T-12  . MITRAL VALVE REPLACEMENT  2008   due to severe MR  . Resection and grafting of ascending thoracic aortic    . RIGHT HEART CATHETERIZATION N/A 04/03/2012   Procedure: RIGHT HEART CATH;  Surgeon: [name], MD;  Location: [facility] CATH LAB;  Service: Cardiovascular;  Laterality: N/A;  . WIDE EXCISION OF VULVA     CA INSITU    Social History   Social  History  . Marital status: Married    Spouse name: N/A  . Number of children: 0  . Years of education: N/A   Occupational History  . Retired Radiation protection practitioner    Social History Main Topics  . Smoking status: Former Smoker    Types: Cigarettes    Quit date: 03/09/1983  . Smokeless tobacco: Never Used  . Alcohol use No  . Drug use: No  . Sexual activity: Not Currently    Birth control/ protection: Post-menopausal   Other Topics Concern  . Not on file   Social History Narrative   Her mother died at age 64 from a stroke.  Her father died at age 9, had chronic obstructive pulmonary disease and colon disease.   Regular exercise: was in cardiac rehab   Caffeine use: none    Family History  Problem Relation Age of  Onset  . Hypertension Mother   . Breast cancer Maternal Aunt        age 57's  . Colon cancer Paternal Grandmother   . Diabetes Maternal Uncle   . Diabetes Paternal Uncle   . Emphysema Father     ROS: back pain but no fevers or chills, productive cough, hemoptysis, dysphasia, odynophagia, melena, hematochezia, dysuria, hematuria, rash, seizure activity, orthopnea, PND, pedal edema, claudication. Remaining systems are negative.  Physical Exam: Well-developed well-nourished in no acute distress.  Skin is warm and dry.  HEENT is normal.  Neck is supple.  Chest is clear to auscultation with normal expansion.  Cardiovascular exam is regular rate and rhythm.  Abdominal exam nontender or distended. No masses palpated. Extremities show no edema. neuro grossly intact   A/P  1 Status post aortic valve replacement and mitral valve replacement-continue SBE prophylaxis.  2 paroxysmal atrial fibrillation-patient remains in sinus rhythm on examination. Continue amiodarone. Check TSH, liver functions and chest x-ray. Continue Coumadin.  3 hypertension-blood pressure is elevated. However typically control. I have asked them to follow this and we will advance medications as  needed.  4 hyperlipidemia-continue diet. Patient not interested in statins.  5 prior ICD-followed by electrophysiology.  6 ischemic cardiomyopathy-continue ARB and beta blocker. LV function has improved on most recent echocardiogram.  [name], MD

## 2016-11-22 ENCOUNTER — Ambulatory Visit (INDEPENDENT_AMBULATORY_CARE_PROVIDER_SITE_OTHER): Payer: Medicare Other | Admitting: *Deleted

## 2016-11-22 DIAGNOSIS — I255 Ischemic cardiomyopathy: Secondary | ICD-10-CM | POA: Diagnosis not present

## 2016-11-22 DIAGNOSIS — Z95810 Presence of automatic (implantable) cardiac defibrillator: Secondary | ICD-10-CM | POA: Diagnosis not present

## 2016-11-22 NOTE — Progress Notes (Signed)
Remote ICD transmission.   

## 2016-11-23 NOTE — Progress Notes (Signed)
EPIC Encounter for ICM Monitoring  Patient Name: [name] is a 76 y.o. female Date: 11/23/2016 Primary Care Physican: Velna Hatchet, MD Primary Cardiologist:Crenshaw Electrophysiologist: Allred Dry Weight:unknown           Heart Failure questions reviewed, pt asymptomatic.   Thoracic impedance normal.  Prescribed dosage: Furosemide 20 mg by mouth daily. Can take an extra 1 tablet daily as needed for swelling  LABS: 05/07/2016 Creatinine 1.4, BUN 33, Potassium 5.2, Sodium 138, EGFR 36.8 to 44.5 12/09/2015 Creatinine 1.81, BUN 29, Potassium 4.0, Sodium 140  03/20/2015 Creatinine 1.53, BUN 21, Potassium 4.4, Sodium 139 12/11/2014 Creatinine 1.60, BUN 34, Potassium 3.8, Sodium 139 12/09/2014 Creatinine 1.72, BUN 32, Potassium 3.8, Sodium 138 11/11/2014 Creatinine 1.50, BUN 21, Potassium 4.2, Sodium 140.  Recommendations:  No changes.   Encouraged to call for fluid symptoms.  Follow-up plan: ICM clinic phone appointment on 12/28/2016.   Copy of ICM check sent to Dr. Rayann Heman.   3 month ICM trend: 11/22/2016   1 Year ICM trend:      Rosalene Billings, RN 11/23/2016 3:11 PM

## 2016-11-24 ENCOUNTER — Encounter: Payer: Self-pay | Admitting: Cardiology

## 2016-11-24 LAB — CUP PACEART REMOTE DEVICE CHECK
Implantable Lead Implant Date: 20140403
Implantable Lead Location: 753860
MDC IDC LEAD IMPLANT DT: 20140403
MDC IDC LEAD LOCATION: 753859
MDC IDC PG IMPLANT DT: 20140403
MDC IDC PG SERIAL: 7080516
MDC IDC SESS DTM: 20180919122015

## 2016-11-29 ENCOUNTER — Ambulatory Visit (INDEPENDENT_AMBULATORY_CARE_PROVIDER_SITE_OTHER): Payer: Medicare Other | Admitting: Pharmacist

## 2016-11-29 DIAGNOSIS — Z7901 Long term (current) use of anticoagulants: Secondary | ICD-10-CM | POA: Diagnosis not present

## 2016-11-29 DIAGNOSIS — I359 Nonrheumatic aortic valve disorder, unspecified: Secondary | ICD-10-CM

## 2016-11-29 DIAGNOSIS — I481 Persistent atrial fibrillation: Secondary | ICD-10-CM

## 2016-11-29 DIAGNOSIS — I4891 Unspecified atrial fibrillation: Secondary | ICD-10-CM

## 2016-11-29 DIAGNOSIS — I059 Rheumatic mitral valve disease, unspecified: Secondary | ICD-10-CM

## 2016-11-29 DIAGNOSIS — I4819 Other persistent atrial fibrillation: Secondary | ICD-10-CM

## 2016-11-29 LAB — POCT INR: INR: 2.2

## 2016-11-30 ENCOUNTER — Ambulatory Visit (INDEPENDENT_AMBULATORY_CARE_PROVIDER_SITE_OTHER): Payer: Medicare Other | Admitting: Cardiology

## 2016-11-30 ENCOUNTER — Encounter: Payer: Self-pay | Admitting: Cardiology

## 2016-11-30 VITALS — BP 152/62 | HR 62 | Ht 61.0 in | Wt 147.0 lb

## 2016-11-30 DIAGNOSIS — I255 Ischemic cardiomyopathy: Secondary | ICD-10-CM

## 2016-11-30 DIAGNOSIS — Z95810 Presence of automatic (implantable) cardiac defibrillator: Secondary | ICD-10-CM | POA: Diagnosis not present

## 2016-11-30 DIAGNOSIS — I480 Paroxysmal atrial fibrillation: Secondary | ICD-10-CM

## 2016-11-30 DIAGNOSIS — I359 Nonrheumatic aortic valve disorder, unspecified: Secondary | ICD-10-CM | POA: Diagnosis not present

## 2016-11-30 DIAGNOSIS — I059 Rheumatic mitral valve disease, unspecified: Secondary | ICD-10-CM

## 2016-11-30 LAB — BASIC METABOLIC PANEL
BUN / CREAT RATIO: 19 (ref 12–28)
BUN: 28 mg/dL — AB (ref 8–27)
CO2: 29 mmol/L (ref 20–29)
CREATININE: 1.49 mg/dL — AB (ref 0.57–1.00)
Calcium: 9.4 mg/dL (ref 8.7–10.3)
Chloride: 99 mmol/L (ref 96–106)
GFR calc Af Amer: 39 mL/min/{1.73_m2} — ABNORMAL LOW (ref >59)
GFR, EST NON AFRICAN AMERICAN: 34 mL/min/{1.73_m2} — AB (ref >59)
GLUCOSE: 110 mg/dL — AB (ref 65–99)
Potassium: 5.2 mmol/L (ref 3.5–5.2)
Sodium: 142 mmol/L (ref 134–144)

## 2016-11-30 LAB — CBC
HEMOGLOBIN: 11.9 g/dL (ref 11.1–15.9)
Hematocrit: 36.8 % (ref 34.0–46.6)
MCH: 30.6 pg (ref 26.6–33.0)
MCHC: 32.3 g/dL (ref 31.5–35.7)
MCV: 95 fL (ref 79–97)
PLATELETS: 299 10*3/uL (ref 150–379)
RBC: 3.89 x10E6/uL (ref 3.77–5.28)
RDW: 13.5 % (ref 12.3–15.4)
WBC: 7.6 10*3/uL (ref 3.4–10.8)

## 2016-11-30 LAB — HEPATIC FUNCTION PANEL
ALK PHOS: 61 IU/L (ref 39–117)
ALT: 15 IU/L (ref 0–32)
AST: 27 IU/L (ref 0–40)
Albumin: 4.1 g/dL (ref 3.5–4.8)
BILIRUBIN TOTAL: 0.5 mg/dL (ref 0.0–1.2)
BILIRUBIN, DIRECT: 0.18 mg/dL (ref 0.00–0.40)
TOTAL PROTEIN: 6.7 g/dL (ref 6.0–8.5)

## 2016-11-30 LAB — TSH: TSH: 4.21 u[IU]/mL (ref 0.450–4.500)

## 2016-11-30 NOTE — Patient Instructions (Signed)
Medication Instructions:   NO CHANGE  Labwork:  Your physician recommends that you HAVE LAB WORK TODAY  Testing/Procedures:  A chest x-ray takes a picture of the organs and structures inside the chest, including the heart, lungs, and blood vessels. This test can show several things, including, whether the heart is enlarges; whether fluid is building up in the lungs; and whether pacemaker / defibrillator leads are still in place. AT [facility]  Follow-Up:  Your physician wants you to follow-up in: 6 MONTHS WITH DR CRENSHAW You will receive a reminder letter in the mail two months in advance. If you don't receive a letter, please call our office to schedule the follow-up appointment.   If you need a refill on your cardiac medications before your next appointment, please call your pharmacy.    

## 2016-12-06 ENCOUNTER — Ambulatory Visit (HOSPITAL_COMMUNITY)
Admission: RE | Admit: 2016-12-06 | Discharge: 2016-12-06 | Disposition: A | Discharge status: Home / Self Care | Payer: Medicare Other | Source: Ambulatory Visit | Attending: Cardiology | Admitting: Cardiology

## 2016-12-06 ENCOUNTER — Telehealth: Payer: Self-pay | Admitting: Cardiology

## 2016-12-06 DIAGNOSIS — Z952 Presence of prosthetic heart valve: Secondary | ICD-10-CM | POA: Insufficient documentation

## 2016-12-06 DIAGNOSIS — I480 Paroxysmal atrial fibrillation: Secondary | ICD-10-CM | POA: Insufficient documentation

## 2016-12-06 DIAGNOSIS — I700 Atherosclerosis of aorta: Secondary | ICD-10-CM | POA: Insufficient documentation

## 2016-12-06 DIAGNOSIS — R9389 Abnormal findings on diagnostic imaging of other specified body structures: Secondary | ICD-10-CM

## 2016-12-06 DIAGNOSIS — R0602 Shortness of breath: Secondary | ICD-10-CM | POA: Diagnosis not present

## 2016-12-06 NOTE — Addendum Note (Signed)
Addended by: Cristopher Estimable on: 12/06/2016 02:13 PM   Modules accepted: Orders

## 2016-12-06 NOTE — Telephone Encounter (Signed)
cxr has been reviewed see result note.

## 2016-12-06 NOTE — Telephone Encounter (Signed)
pt aware of results, referral placed.

## 2016-12-06 NOTE — Telephone Encounter (Signed)
Tech Carloyn Manner called to make sure xray report "crossed over" for Dr Stanford Breed to review, printed and given to Dr Greg Cutter

## 2016-12-21 ENCOUNTER — Encounter: Payer: Self-pay | Admitting: Cardiology

## 2016-12-21 NOTE — Telephone Encounter (Signed)
This encounter was created in error - please disregard.

## 2016-12-21 NOTE — Telephone Encounter (Signed)
Mrs.Riffel is wanting to speak with you about something that you talked about a couple weeks ago( Did not want to disclose the reason)

## 2016-12-28 ENCOUNTER — Ambulatory Visit (INDEPENDENT_AMBULATORY_CARE_PROVIDER_SITE_OTHER): Payer: Medicare Other

## 2016-12-28 ENCOUNTER — Telehealth: Payer: Self-pay

## 2016-12-28 DIAGNOSIS — I255 Ischemic cardiomyopathy: Secondary | ICD-10-CM | POA: Diagnosis not present

## 2016-12-28 DIAGNOSIS — Z95810 Presence of automatic (implantable) cardiac defibrillator: Secondary | ICD-10-CM

## 2016-12-28 NOTE — Progress Notes (Signed)
EPIC Encounter for ICM Monitoring  Patient Name: [name] is a 76 y.o. female Date: 12/28/2016 Primary Care Physican: Velna Hatchet, MD Primary Cardiologist:Crenshaw Electrophysiologist: Allred Dry Weight:unknown      Attempted call to patient and unable to reach.  Left detailed message regarding transmission.  Transmission reviewed.    Thoracic impedance normal.  Prescribed dosage: Furosemide 20 mg by mouth daily. Can take an extra 1 tablet daily as needed for swelling  LABS: 11/30/2016 Creatinine 1.49, BUN 28, Potassium 5.2, Sodium 142, EGFR 34-39 05/07/2016 Creatinine 1.4, BUN 33, Potassium 5.2, Sodium 138, EGFR 36.8 to 44.5 12/09/2015 Creatinine 1.81, BUN 29, Potassium 4.0, Sodium 140  03/20/2015 Creatinine 1.53, BUN 21, Potassium 4.4, Sodium 139 12/11/2014 Creatinine 1.60, BUN 34, Potassium 3.8, Sodium 139 12/09/2014 Creatinine 1.72, BUN 32, Potassium 3.8, Sodium 138 11/11/2014 Creatinine 1.50, BUN 21, Potassium 4.2, Sodium 140.  Recommendations: Left voice mail with ICM number and encouraged to call if experiencing any fluid symptoms.  Follow-up plan: ICM clinic phone appointment on 01/31/2017.    Copy of ICM check sent to Dr. Rayann Heman.   3 month ICM trend: 12/28/2016   1 Year ICM trend:      Rosalene Billings, RN 12/28/2016 10:53 AM

## 2016-12-28 NOTE — Telephone Encounter (Signed)
Remote ICM transmission received.  Attempted call to patient and left detailed message per DPR regarding transmission and next ICM scheduled for 01/31/2017.  Advised to return call for any fluid symptoms or questions.

## 2017-01-03 ENCOUNTER — Ambulatory Visit (INDEPENDENT_AMBULATORY_CARE_PROVIDER_SITE_OTHER): Payer: Medicare Other | Admitting: Pulmonary Disease

## 2017-01-03 ENCOUNTER — Encounter: Payer: Self-pay | Admitting: Pulmonary Disease

## 2017-01-03 DIAGNOSIS — J849 Interstitial pulmonary disease, unspecified: Secondary | ICD-10-CM

## 2017-01-03 NOTE — Progress Notes (Signed)
[name]    [id number]    1940-06-30  Primary Care Physician:Velna Hatchet, MD  Referring Physician: Velna Hatchet, [place] [address], [place] [address]  Chief complaint: Consult for evaluation of abnormal chest x-ray  HPI: 76 year old with history of paroxysmal atrial fibrillation, mitral regurgitation status post repair.  She has been referred for evaluation of dyspnea on exertion and abnormal chest x-ray She has complaints of dyspnea on exertion cough with white mucus.  Denies dyspnea at rest.  Outpatient Encounter Prescriptions as of 01/03/2017  Medication Sig  . carvedilol (COREG) 12.5 MG tablet TAKE 1 TABLET (12.5 MG TOTAL) BY MOUTH 2 (TWO) TIMES DAILY WITH A MEAL.  Marland Kitchen Cyanocobalamin (VITAMIN B-12) 2500 MCG SUBL Place 1 tablet under the tongue every Monday, Wednesday, and Friday.   . ergocalciferol (VITAMIN D2) 50000 UNITS capsule Take 50,000 Units by mouth once a week. Monday  . furosemide (LASIX) 20 MG tablet Take 20 mg by mouth daily. Can take an extra 1 tablet daily as needed for swelling  . losartan (COZAAR) 50 MG tablet TAKE 1 TABLET (50 MG TOTAL) BY MOUTH DAILY.  . NON FORMULARY ([employer]) Take 500 mg of elemental calcium by mouth at bedtime  . PACERONE 200 MG tablet Take 0.5 tablets (100 mg total) by mouth daily.  . Psyllium (METAMUCIL PO) Take 1 teaspoon by mouth daily with water  . Teriparatide, Recombinant, (FORTEO) 600 MCG/2.4ML SOLN Inject 20 mcg into the skin daily.  Marland Kitchen warfarin (COUMADIN) 3 MG tablet TAKE 1/2 TO 1 TABLET BY MOUTH DAILY AS DIRECTED BY COUMADIN CLINIC   No facility-administered encounter medications on file as of 01/03/2017.     Allergies as of 01/03/2017 - Review Complete 01/03/2017  Allergen Reaction Noted  . Metformin and related  10/19/2012  . Tikosyn [dofetilide]  10/14/2014  . Caffeine Other (See Comments) 11/20/2014    Past Medical History:  Diagnosis Date  . AICD (automatic cardioverter/defibrillator) present     2014  . Anemia   . Arthritis    OSTEO  . Atrial fibrillation [facility])    A.fib/flutter: s/p MAZE 2008, DCCV 2011, on coumadin; previously on flecainide, but dc'd due to worsening EF. Tikosyn discontinued 06/2012 after cardiac arrest.  . Carcinoma in situ of vulva   . Chronic systolic CHF (congestive heart failure) (HCC)    EF 35%  . Coronary artery disease    Occluded LM 2/2 previous aortic root surgery; s/p 2v CABG 2010 LIMA to LAD, SVG to OM; Cath 03/11/12 patent SVG to OM, atretic LIMA to LAD, patent RCA, occluded native LM, EF 35%   . CVA (cerebral vascular accident) ([place])    2008 - felt due to oscillating calcium on aortic valve  . Diabetes mellitus   . Diverticulitis   . Dysrhythmia   . Fibroid   . Fracture of lumbar spine ([place])   . GERD (gastroesophageal reflux disease)   . Hypertension   . Ischemic cardiomyopathy    EF 35%  . Kidney stones   . Mitral regurgitation    s/p mitral valve repair 2008  . Osteoporosis   . Ovarian cyst, left 2008-2009  . Severe aortic stenosis    s/p homograft aortic root replacement 2008  . Stroke ([place])   . Superficial venous thrombosis of upper extremity    06/2012 - LUE  . Thoracic aortic aneurysm [facility])    s/p resection and grafting 2008  . Ventricular fibrillation ([place])  VF arrest/VT/torsades 06/2012 with prolonged hosp with VDRF, cardiogen shock, asp PNA, encephalopathy, anemia, shock liver, hypernatremia - s/p ICD implantation 06/08/12.    Past Surgical History:  Procedure Laterality Date  . aneurysm and left sided maze  2008  . AORTIC ROOT REPLACEMENT  2008   homograft  . CARDIAC DEFIBRILLATOR PLACEMENT  06/08/2012   St. Jude Medical Fortify Assura DR  implanted by Dr Rayann Heman following a cardiac arrest  . CATARACT EXTRACTION    . CORONARY ARTERY BYPASS GRAFT  4/10   LIMA to LAD, SVG to OM  . EYE SURGERY    . FOOT SURGERY Left    removed neuroma  . IMPLANTABLE CARDIOVERTER DEFIBRILLATOR IMPLANT N/A 06/08/2012   Procedure: IMPLANTABLE  CARDIOVERTER DEFIBRILLATOR IMPLANT;  Surgeon: Thompson Grayer, MD;  Location: [facility] CATH LAB;  Service: Cardiovascular;  Laterality: N/A;  . KYPHOPLASTY N/A 12/11/2014   Procedure: KYPHOPLASTY T-12;  Surgeon: Karie Chimera, MD;  Location: [place] NEURO ORS;  Service: Neurosurgery;  Laterality: N/A;  KYPHOPLASTY T-12  . MITRAL VALVE REPLACEMENT  2008   due to severe MR  . Resection and grafting of ascending thoracic aortic    . RIGHT HEART CATHETERIZATION N/A 04/03/2012   Procedure: RIGHT HEART CATH;  Surgeon: [name], MD;  Location: [facility] CATH LAB;  Service: Cardiovascular;  Laterality: N/A;  . WIDE EXCISION OF VULVA     CA INSITU    Family History  Problem Relation Age of Onset  . Hypertension Mother   . Breast cancer Maternal Aunt        age 49's  . Colon cancer Paternal Grandmother   . Diabetes Maternal Uncle   . Diabetes Paternal Uncle   . Emphysema Father     Social History   Social History  . Marital status: Married    Spouse name: N/A  . Number of children: 0  . Years of education: N/A   Occupational History  . Retired Radiation protection practitioner    Social History Main Topics  . Smoking status: Former Smoker    Packs/day: 0.25    Years: 20.00    Types: Cigarettes    Quit date: 03/09/1983  . Smokeless tobacco: Never Used  . Alcohol use No  . Drug use: No  . Sexual activity: Not Currently    Birth control/ protection: Post-menopausal   Other Topics Concern  . Not on file   Social History Narrative   Her mother died at age 90 from a stroke.  Her father died at age 54, had chronic obstructive pulmonary disease and colon disease.   Regular exercise: was in cardiac rehab   Caffeine use: none    Review of systems: Review of Systems  Constitutional: Negative for fever and chills.  HENT: Negative.   Eyes: Negative for blurred vision.  Respiratory: as per HPI  Cardiovascular: Negative for chest pain and palpitations.  Gastrointestinal: Negative for vomiting, diarrhea, blood per  rectum. Genitourinary: Negative for dysuria, urgency, frequency and hematuria.  Musculoskeletal: Negative for myalgias, back pain and joint pain.  Skin: Negative for itching and rash.  Neurological: Negative for dizziness, tremors, focal weakness, seizures and loss of consciousness.  Endo/Heme/Allergies: Negative for environmental allergies.  Psychiatric/Behavioral: Negative for depression, suicidal ideas and hallucinations.  All other systems reviewed and are negative.  Physical Exam: Blood pressure 130/74, pulse 60, height 5\' 1"  (1.549 m), weight 67 kg (147 lb 12.8 oz), SpO2 97 %. Gen:      No acute distress HEENT:  EOMI, sclera anicteric Neck:  No masses; no thyromegaly Lungs:    Clear to auscultation bilaterally; normal respiratory effort CV:         Regular rate and rhythm; no murmurs Abd:      + bowel sounds; soft, non-tender; no palpable masses, no distension Ext:    No edema; adequate peripheral perfusion Skin:      Warm and dry; no rash Neuro: alert and oriented x 3 Psych: normal mood and affect  Data Reviewed: Chest x-ray 12/06/16-prominent interstitium, mild cardiac enlargement with prominence of pulmonary arteries. I have reviewed the images personally.  Echo 11/10/16- Normal LV size with EF 55%. Normal RV size with mildly decreased   systolic function. S/p aortic valve replacement with   bioprosthetic aortic valve and aortic root repair. No significant   stenosis, trivial regurgitation. S/p mitral valve repair with no   significant stenosis and mild regurgitation.  Assessment:  Consult for evaluation of ILD Dyspnea Needs evaluation for COPD given the smoking history Presentation is not very suggestive of lung fibrosis or ILD. However give the fact that she is on amiodarone we will evaluate with high res CT and PFTs.  No need for inhalers at present. Will review the tests and determine further steps for management.  Plan/Recommendations: - High res CT,  PFTs  Marshell Garfinkel MD [facility] Pager [phone] 01/03/2017, 4:21 PM  CC: Velna Hatchet, MD

## 2017-01-03 NOTE — Patient Instructions (Signed)
We will schedule you for a high-resolution CT and pulmonary function test for better evaluation of your interstitial lung disease Follow-up in 1-2 months for review.

## 2017-01-09 DIAGNOSIS — Z23 Encounter for immunization: Secondary | ICD-10-CM | POA: Diagnosis not present

## 2017-01-10 ENCOUNTER — Ambulatory Visit (INDEPENDENT_AMBULATORY_CARE_PROVIDER_SITE_OTHER): Payer: Medicare Other | Admitting: *Deleted

## 2017-01-10 ENCOUNTER — Ambulatory Visit (INDEPENDENT_AMBULATORY_CARE_PROVIDER_SITE_OTHER)
Admission: RE | Admit: 2017-01-10 | Discharge: 2017-01-10 | Disposition: A | Discharge status: Home / Self Care | Payer: Medicare Other | Source: Ambulatory Visit | Attending: Pulmonary Disease | Admitting: Pulmonary Disease

## 2017-01-10 DIAGNOSIS — Z7901 Long term (current) use of anticoagulants: Secondary | ICD-10-CM

## 2017-01-10 DIAGNOSIS — R0609 Other forms of dyspnea: Secondary | ICD-10-CM | POA: Diagnosis not present

## 2017-01-10 DIAGNOSIS — I059 Rheumatic mitral valve disease, unspecified: Secondary | ICD-10-CM

## 2017-01-10 DIAGNOSIS — I359 Nonrheumatic aortic valve disorder, unspecified: Secondary | ICD-10-CM | POA: Diagnosis not present

## 2017-01-10 DIAGNOSIS — I4891 Unspecified atrial fibrillation: Secondary | ICD-10-CM

## 2017-01-10 DIAGNOSIS — J849 Interstitial pulmonary disease, unspecified: Secondary | ICD-10-CM | POA: Diagnosis not present

## 2017-01-10 LAB — PROTIME-INR
INR: 2.5 — ABNORMAL HIGH (ref 0.8–1.2)
PROTHROMBIN TIME: 27.1 s — AB (ref 9.1–12.0)

## 2017-01-11 ENCOUNTER — Other Ambulatory Visit: Payer: Self-pay | Admitting: Cardiology

## 2017-01-17 DIAGNOSIS — E559 Vitamin D deficiency, unspecified: Secondary | ICD-10-CM | POA: Diagnosis not present

## 2017-01-17 DIAGNOSIS — M810 Age-related osteoporosis without current pathological fracture: Secondary | ICD-10-CM | POA: Diagnosis not present

## 2017-01-17 DIAGNOSIS — R5383 Other fatigue: Secondary | ICD-10-CM | POA: Diagnosis not present

## 2017-01-19 ENCOUNTER — Encounter: Payer: Self-pay | Admitting: Cardiology

## 2017-01-19 NOTE — Telephone Encounter (Signed)
Returned call to patient she stated she wanted to speak to [place].Advised I will send message to her.

## 2017-01-19 NOTE — Telephone Encounter (Signed)
This encounter was created in error - please disregard.

## 2017-01-19 NOTE — Telephone Encounter (Signed)
New message    Patient calling with additional questions regarding CT chest results. Please call

## 2017-01-20 DIAGNOSIS — E559 Vitamin D deficiency, unspecified: Secondary | ICD-10-CM | POA: Diagnosis not present

## 2017-01-20 DIAGNOSIS — M810 Age-related osteoporosis without current pathological fracture: Secondary | ICD-10-CM | POA: Diagnosis not present

## 2017-01-31 ENCOUNTER — Ambulatory Visit (INDEPENDENT_AMBULATORY_CARE_PROVIDER_SITE_OTHER): Payer: Medicare Other

## 2017-01-31 DIAGNOSIS — Z95810 Presence of automatic (implantable) cardiac defibrillator: Secondary | ICD-10-CM

## 2017-01-31 DIAGNOSIS — I255 Ischemic cardiomyopathy: Secondary | ICD-10-CM

## 2017-01-31 NOTE — Progress Notes (Signed)
EPIC Encounter for ICM Monitoring  Patient Name: [name] is a 75 y.o. female Date: 01/31/2017 Primary Care Physican: Velna Hatchet, MD Primary Cardiologist:Crenshaw Electrophysiologist: Allred Dry Weight: unknown      Heart Failure questions reviewed, pt asymptomatic.   Thoracic impedance normal.  Prescribed dosage: Furosemide 20 mg by mouth daily. Can take an extra 1 tablet daily as needed for swelling  LABS: 11/30/2016 Creatinine 1.49, BUN 28, Potassium 5.2, Sodium 142, EGFR 34-39 05/07/2016 Creatinine 1.4, BUN 33, Potassium 5.2, Sodium 138, EGFR 36.8 to 44.5 12/09/2015 Creatinine 1.81, BUN 29, Potassium 4.0, Sodium 140  03/20/2015 Creatinine 1.53, BUN 21, Potassium 4.4, Sodium 139 12/11/2014 Creatinine 1.60, BUN 34, Potassium 3.8, Sodium 139 12/09/2014 Creatinine 1.72, BUN 32, Potassium 3.8, Sodium 138 11/11/2014 Creatinine 1.50, BUN 21, Potassium 4.2, Sodium 140.  Recommendations:  No changes.   Encouraged to call for fluid symptoms.  Follow-up plan: ICM clinic phone appointment on 03/03/2017.    Copy of ICM check sent to Dr. Rayann Heman.   3 month ICM trend: 01/31/2017    1 Year ICM trend:       Rosalene Billings, RN 01/31/2017 10:50 AM

## 2017-02-10 ENCOUNTER — Other Ambulatory Visit: Payer: Self-pay | Admitting: Internal Medicine

## 2017-02-10 ENCOUNTER — Other Ambulatory Visit: Payer: Self-pay | Admitting: Cardiology

## 2017-02-10 DIAGNOSIS — I255 Ischemic cardiomyopathy: Secondary | ICD-10-CM

## 2017-02-11 NOTE — Telephone Encounter (Signed)
Rx(s) sent to pharmacy electronically.  

## 2017-02-15 ENCOUNTER — Other Ambulatory Visit: Payer: Self-pay | Admitting: Internal Medicine

## 2017-02-15 DIAGNOSIS — I255 Ischemic cardiomyopathy: Secondary | ICD-10-CM

## 2017-02-21 ENCOUNTER — Ambulatory Visit (INDEPENDENT_AMBULATORY_CARE_PROVIDER_SITE_OTHER): Payer: Medicare Other

## 2017-02-21 DIAGNOSIS — I4891 Unspecified atrial fibrillation: Secondary | ICD-10-CM

## 2017-02-21 DIAGNOSIS — I359 Nonrheumatic aortic valve disorder, unspecified: Secondary | ICD-10-CM

## 2017-02-21 DIAGNOSIS — I059 Rheumatic mitral valve disease, unspecified: Secondary | ICD-10-CM | POA: Diagnosis not present

## 2017-02-21 DIAGNOSIS — Z7901 Long term (current) use of anticoagulants: Secondary | ICD-10-CM | POA: Diagnosis not present

## 2017-02-21 LAB — POCT INR: INR: 2.5

## 2017-02-21 NOTE — Patient Instructions (Signed)
Continue on same dosage 1/2 tablet daily except 1 tablet Sundays, Tuesdays, and Thursdays.  Recheck in 6 weeks. Call if any new medications [phone].

## 2017-02-23 ENCOUNTER — Other Ambulatory Visit: Payer: Self-pay

## 2017-02-23 MED ORDER — CARVEDILOL 12.5 MG PO TABS: 12.5000 mg | ORAL_TABLET | Freq: Two times a day (BID) | ORAL | 2 refills | Status: DC

## 2017-02-23 NOTE — Telephone Encounter (Signed)
Rx(s) sent to pharmacy electronically.  

## 2017-03-03 ENCOUNTER — Ambulatory Visit (INDEPENDENT_AMBULATORY_CARE_PROVIDER_SITE_OTHER): Payer: Medicare Other | Admitting: *Deleted

## 2017-03-03 DIAGNOSIS — I255 Ischemic cardiomyopathy: Secondary | ICD-10-CM | POA: Diagnosis not present

## 2017-03-03 DIAGNOSIS — Z95810 Presence of automatic (implantable) cardiac defibrillator: Secondary | ICD-10-CM

## 2017-03-03 NOTE — Progress Notes (Signed)
Remote ICD transmission.   

## 2017-03-03 NOTE — Progress Notes (Signed)
EPIC Encounter for ICM Monitoring  Patient Name: [name] is a 76 y.o. female Date: 03/03/2017 Primary Care Physican: Velna Hatchet, MD Primary Cardiologist:Crenshaw Electrophysiologist: Allred Dry Weight: 146 lbs          Heart Failure questions reviewed, pt asymptomatic.   Thoracic impedance normal.  Prescribed dosage: Furosemide 20 mg by mouth daily. Can take an extra 1 tablet daily as needed for swelling  LABS: 11/30/2016 Creatinine 1.49, BUN 28, Potassium 5.2, Sodium 142, EGFR 34-39 05/07/2016 Creatinine 1.4, BUN 33, Potassium 5.2, Sodium 138, EGFR 36.8 to 44.5 12/09/2015 Creatinine 1.81, BUN 29, Potassium 4.0, Sodium 140  03/20/2015 Creatinine 1.53, BUN 21, Potassium 4.4, Sodium 139 12/11/2014 Creatinine 1.60, BUN 34, Potassium 3.8, Sodium 139 12/09/2014 Creatinine 1.72, BUN 32, Potassium 3.8, Sodium 138 11/11/2014 Creatinine 1.50, BUN 21, Potassium 4.2, Sodium 140.  Recommendations: No changes.   Encouraged to call for fluid symptoms.  Follow-up plan: ICM clinic phone appointment on 04/04/2017.    Copy of ICM check sent to Dr. Rayann Heman.   3 month ICM trend: 03/03/2017    1 Year ICM trend:      Rosalene Billings, RN 03/03/2017 3:03 PM

## 2017-03-04 ENCOUNTER — Encounter: Payer: Self-pay | Admitting: Cardiology

## 2017-03-08 ENCOUNTER — Other Ambulatory Visit: Payer: Self-pay | Admitting: Cardiology

## 2017-03-08 DIAGNOSIS — I255 Ischemic cardiomyopathy: Secondary | ICD-10-CM

## 2017-03-09 NOTE — Telephone Encounter (Signed)
Rx has been sent to the pharmacy electronically. ° °

## 2017-03-15 LAB — CUP PACEART REMOTE DEVICE CHECK
Battery Remaining Longevity: 49 mo
Battery Voltage: 2.98 V
Brady Statistic AP VS Percent: 69 %
Brady Statistic AS VP Percent: 1 %
Brady Statistic RA Percent Paced: 68 %
Date Time Interrogation Session: 20181227090019
HIGH POWER IMPEDANCE MEASURED VALUE: 55 Ohm
HighPow Impedance: 55 Ohm
Implantable Lead Implant Date: 20140403
Implantable Pulse Generator Implant Date: 20140403
Lead Channel Impedance Value: 400 Ohm
Lead Channel Pacing Threshold Amplitude: 0.75 V
Lead Channel Pacing Threshold Pulse Width: 0.5 ms
Lead Channel Sensing Intrinsic Amplitude: 12 mV
Lead Channel Setting Pacing Amplitude: 2 V
Lead Channel Setting Pacing Pulse Width: 0.5 ms
Lead Channel Setting Sensing Sensitivity: 0.5 mV
MDC IDC LEAD IMPLANT DT: 20140403
MDC IDC LEAD LOCATION: 753859
MDC IDC LEAD LOCATION: 753860
MDC IDC MSMT BATTERY REMAINING PERCENTAGE: 52 %
MDC IDC MSMT LEADCHNL RA PACING THRESHOLD AMPLITUDE: 0.75 V
MDC IDC MSMT LEADCHNL RA SENSING INTR AMPL: 2.2 mV
MDC IDC MSMT LEADCHNL RV IMPEDANCE VALUE: 600 Ohm
MDC IDC MSMT LEADCHNL RV PACING THRESHOLD PULSEWIDTH: 0.5 ms
MDC IDC SET LEADCHNL RV PACING AMPLITUDE: 2.5 V
MDC IDC STAT BRADY AP VP PERCENT: 1 %
MDC IDC STAT BRADY AS VS PERCENT: 30 %
MDC IDC STAT BRADY RV PERCENT PACED: 1 %
Pulse Gen Serial Number: 7080516

## 2017-03-21 ENCOUNTER — Ambulatory Visit (INDEPENDENT_AMBULATORY_CARE_PROVIDER_SITE_OTHER): Payer: Medicare Other | Admitting: Pulmonary Disease

## 2017-03-21 ENCOUNTER — Encounter: Payer: Self-pay | Admitting: Pulmonary Disease

## 2017-03-21 VITALS — BP 110/72 | HR 64 | Ht 61.0 in | Wt 151.0 lb

## 2017-03-21 DIAGNOSIS — J849 Interstitial pulmonary disease, unspecified: Secondary | ICD-10-CM

## 2017-03-21 DIAGNOSIS — M810 Age-related osteoporosis without current pathological fracture: Secondary | ICD-10-CM | POA: Diagnosis not present

## 2017-03-21 DIAGNOSIS — E559 Vitamin D deficiency, unspecified: Secondary | ICD-10-CM | POA: Diagnosis not present

## 2017-03-21 DIAGNOSIS — I255 Ischemic cardiomyopathy: Secondary | ICD-10-CM | POA: Diagnosis not present

## 2017-03-21 DIAGNOSIS — R5383 Other fatigue: Secondary | ICD-10-CM | POA: Diagnosis not present

## 2017-03-21 LAB — PULMONARY FUNCTION TEST
DL/VA % pred: 95 %
DL/VA: 4.03 ml/min/mmHg/L
DLCO COR: 11.26 ml/min/mmHg
DLCO UNC % PRED: 58 %
DLCO cor % pred: 59 %
DLCO unc: 11.01 ml/min/mmHg
FEF 25-75 PRE: 0.59 L/s
FEF 25-75 Post: 0.84 L/sec
FEF2575-%Change-Post: 42 %
FEF2575-%PRED-PRE: 42 %
FEF2575-%Pred-Post: 60 %
FEV1-%Change-Post: 6 %
FEV1-%PRED-PRE: 51 %
FEV1-%Pred-Post: 54 %
FEV1-POST: 0.93 L
FEV1-PRE: 0.87 L
FEV1FVC-%CHANGE-POST: 6 %
FEV1FVC-%Pred-Pre: 98 %
FEV6-%CHANGE-POST: 0 %
FEV6-%PRED-POST: 54 %
FEV6-%PRED-PRE: 54 %
FEV6-Post: 1.19 L
FEV6-Pre: 1.18 L
FEV6FVC-%Change-Post: 0 %
FEV6FVC-%PRED-POST: 106 %
FEV6FVC-%Pred-Pre: 105 %
FVC-%Change-Post: 0 %
FVC-%Pred-Post: 51 %
FVC-%Pred-Pre: 51 %
FVC-Post: 1.19 L
FVC-Pre: 1.19 L
POST FEV6/FVC RATIO: 100 %
PRE FEV1/FVC RATIO: 74 %
Post FEV1/FVC ratio: 78 %
Pre FEV6/FVC Ratio: 99 %
RV % pred: 114 %
RV: 2.42 L
TLC % PRED: 79 %
TLC: 3.53 L

## 2017-03-21 NOTE — Patient Instructions (Signed)
I do not see any evidence of interstitial lung disease or amiodarone toxicity on your CT scan of PFTs Please follow-up with [place] as needed.  Please call us if there is any change in his symptoms.

## 2017-03-21 NOTE — Progress Notes (Signed)
PFT completed today 03/21/17

## 2017-03-21 NOTE — Progress Notes (Addendum)
[name]    [id number]    1940/07/24  Primary Care Physician:Velna Hatchet, MD  Referring Physician: Velna Hatchet, [address], [place] [address]  Chief complaint: Follow up for chest x-ray, ? ILD  HPI: 77 year old with history of paroxysmal atrial fibrillation, mitral regurgitation status post repair.  She has been referred for evaluation of dyspnea on exertion and abnormal chest x-ray She has complaints of dyspnea on exertion cough with white mucus.  Denies dyspnea at rest.  Interim history: She feels well with no change in symptoms.  Has mild dyspnea on exertion.  Denies any cough, sputum production, fevers, chills.  Outpatient Encounter Medications as of 03/21/2017  Medication Sig  . carvedilol (COREG) 12.5 MG tablet Take 1 tablet (12.5 mg total) by mouth 2 (two) times daily with a meal.  . Cyanocobalamin (VITAMIN B-12) 2500 MCG SUBL Place 1 tablet under the tongue every Monday, Wednesday, and Friday.   . ergocalciferol (VITAMIN D2) 50000 UNITS capsule Take 50,000 Units by mouth once a week. Monday  . furosemide (LASIX) 20 MG tablet Take 20 mg by mouth daily. Can take an extra 1 tablet daily as needed for swelling  . furosemide (LASIX) 20 MG tablet TAKE ONE TABLET ONCE DAILY AND TAKE 1 EXTRA AS NEEDED  . losartan (COZAAR) 50 MG tablet TAKE 1 TABLET (50 MG TOTAL) BY MOUTH DAILY.  . NON FORMULARY ([employer]) Take 500 mg of elemental calcium by mouth at bedtime  . PACERONE 200 MG tablet Take 0.5 tablets (100 mg total) by mouth daily.  . Psyllium (METAMUCIL PO) Take 1 teaspoon by mouth daily with water  . warfarin (COUMADIN) 3 MG tablet TAKE 1/2 TO 1 TABLET BY MOUTH DAILY AS DIRECTED BY COUMADIN CLINIC   No facility-administered encounter medications on file as of 03/21/2017.     Allergies as of 03/21/2017 - Review Complete 03/21/2017  Allergen Reaction Noted  . Metformin and related  10/19/2012  . Tikosyn [dofetilide]  10/14/2014  . Caffeine Other  (See Comments) 11/20/2014    Past Medical History:  Diagnosis Date  . AICD (automatic cardioverter/defibrillator) present    2014  . Anemia   . Arthritis    OSTEO  . Atrial fibrillation [facility])    A.fib/flutter: s/p MAZE 2008, DCCV 2011, on coumadin; previously on flecainide, but dc'd due to worsening EF. Tikosyn discontinued 06/2012 after cardiac arrest.  . Carcinoma in situ of vulva   . Chronic systolic CHF (congestive heart failure) (HCC)    EF 35%  . Coronary artery disease    Occluded LM 2/2 previous aortic root surgery; s/p 2v CABG 2010 LIMA to LAD, SVG to OM; Cath 03/11/12 patent SVG to OM, atretic LIMA to LAD, patent RCA, occluded native LM, EF 35%   . CVA (cerebral vascular accident) ([place])    2008 - felt due to oscillating calcium on aortic valve  . Diabetes mellitus   . Diverticulitis   . Dysrhythmia   . Fibroid   . Fracture of lumbar spine ([place])   . GERD (gastroesophageal reflux disease)   . Hypertension   . Ischemic cardiomyopathy    EF 35%  . Kidney stones   . Mitral regurgitation    s/p mitral valve repair 2008  . Osteoporosis   . Ovarian cyst, left 2008-2009  . Severe aortic stenosis    s/p homograft aortic root replacement 2008  . Stroke ([place])   . Superficial venous thrombosis of upper extremity  06/2012 - LUE  . Thoracic aortic aneurysm [facility])    s/p resection and grafting 2008  . Ventricular fibrillation ([place])    VF arrest/VT/torsades 06/2012 with prolonged hosp with VDRF, cardiogen shock, asp PNA, encephalopathy, anemia, shock liver, hypernatremia - s/p ICD implantation 06/08/12.    Past Surgical History:  Procedure Laterality Date  . aneurysm and left sided maze  2008  . AORTIC ROOT REPLACEMENT  2008   homograft  . CARDIAC DEFIBRILLATOR PLACEMENT  06/08/2012   [employer] Jude Medical Fortify Assura DR  implanted by Dr Rayann Heman following a cardiac arrest  . CATARACT EXTRACTION    . CORONARY ARTERY BYPASS GRAFT  4/10   LIMA to LAD, SVG to OM  . EYE SURGERY    .  FOOT SURGERY Left    removed neuroma  . IMPLANTABLE CARDIOVERTER DEFIBRILLATOR IMPLANT N/A 06/08/2012   Procedure: IMPLANTABLE CARDIOVERTER DEFIBRILLATOR IMPLANT;  Surgeon: Thompson Grayer, MD;  Location: [facility] CATH LAB;  Service: Cardiovascular;  Laterality: N/A;  . KYPHOPLASTY N/A 12/11/2014   Procedure: KYPHOPLASTY T-12;  Surgeon: Karie Chimera, MD;  Location: [place] NEURO ORS;  Service: Neurosurgery;  Laterality: N/A;  KYPHOPLASTY T-12  . MITRAL VALVE REPLACEMENT  2008   due to severe MR  . Resection and grafting of ascending thoracic aortic    . RIGHT HEART CATHETERIZATION N/A 04/03/2012   Procedure: RIGHT HEART CATH;  Surgeon: [name], MD;  Location: [facility] LAB;  Service: Cardiovascular;  Laterality: N/A;  . WIDE EXCISION OF VULVA     CA INSITU    Family History  Problem Relation Age of Onset  . Hypertension Mother   . Breast cancer Maternal Aunt        age 79's  . Colon cancer Paternal Grandmother   . Diabetes Maternal Uncle   . Diabetes Paternal Uncle   . Emphysema Father     Social History   Socioeconomic History  . Marital status: Married    Spouse name: Not on file  . Number of children: 0  . Years of education: Not on file  . Highest education level: Not on file  Social Needs  . Financial resource strain: Not on file  . Food insecurity - worry: Not on file  . Food insecurity - inability: Not on file  . Transportation needs - medical: Not on file  . Transportation needs - non-medical: Not on file  Occupational History  . Occupation: Retired Radiation protection practitioner  Tobacco Use  . Smoking status: Former Smoker    Packs/day: 0.25    Years: 20.00    Pack years: 5.00    Types: Cigarettes    Last attempt to quit: 03/09/1983    Years since quitting: 34.0  . Smokeless tobacco: Never Used  Substance and Sexual Activity  . Alcohol use: No  . Drug use: No  . Sexual activity: Not Currently    Birth control/protection: Post-menopausal  Other Topics Concern  . Not on file  Social  History Narrative   Her mother died at age 33 from a stroke.  Her father died at age 66, had chronic obstructive pulmonary disease and colon disease.   Regular exercise: was in cardiac rehab   Caffeine use: none    Review of systems: Review of Systems  Constitutional: Negative for fever and chills.  HENT: Negative.   Eyes: Negative for blurred vision.  Respiratory: as per HPI  Cardiovascular: Negative for chest pain and palpitations.  Gastrointestinal: Negative for vomiting, diarrhea, blood per rectum. Genitourinary: Negative for  dysuria, urgency, frequency and hematuria.  Musculoskeletal: Negative for myalgias, back pain and joint pain.  Skin: Negative for itching and rash.  Neurological: Negative for dizziness, tremors, focal weakness, seizures and loss of consciousness.  Endo/Heme/Allergies: Negative for environmental allergies.  Psychiatric/Behavioral: Negative for depression, suicidal ideas and hallucinations.  All other systems reviewed and are negative.  Physical Exam: Blood pressure 110/72, pulse 64, height 5\' 1"  (1.549 m), weight 151 lb (68.5 kg), SpO2 99 %. Gen:      No acute distress HEENT:  EOMI, sclera anicteric Neck:     No masses; no thyromegaly Lungs:    Clear to auscultation bilaterally; normal respiratory effort CV:         Regular rate and rhythm; no murmurs Abd:      + bowel sounds; soft, non-tender; no palpable masses, no distension Ext:    No edema; adequate peripheral perfusion Skin:      Warm and dry; no rash Neuro: alert and oriented x 3 Psych: normal mood and affect  Data Reviewed: Chest x-ray 12/06/16-prominent interstitium, mild cardiac enlargement with prominence of pulmonary arteries.  CT high-resolution 01/10/17- evidence of interstitial lung disease.  Cardiomegaly, dilated pulmonary artery suggestive of pulmonary hypertension, thoracic vertebral fractures, cystic prominence over left kidney. Pulmonary nodules stable since 2010. I have reviewed the  images personally.  Echo 11/10/16- Normal LV size with EF 55%. Normal RV size with mildly decreased   systolic function. S/p aortic valve replacement with   bioprosthetic aortic valve and aortic root repair. No significant   stenosis, trivial regurgitation. S/p mitral valve repair with no   significant stenosis and mild regurgitation.  PFTs 03/21/17 FVC 1.19 [51%], FEV1 0.93 [4%], F/F 78, TLC 79%, DLCO/VA 95% Mild restriction, moderate diffusion defect which corrects for alveolar volume.   Assessment:  Consult for evaluation of ILD PFTs reviewed which do not show any obstruction.  There is some restriction and diffusion defect which corrects for alveolar volume CT shows no evidence of lung fibrosis or ILD.  Suspect the PFT changes are secondary to extrinsic restriction from thoracic compression fractures.  There is no evidence of amiodarone toxicity. She is not very symptomatic and does not need any inhalers at present.  Can follow-up with pulmonary clinic as needed.  Plan/Recommendations: - Follow-up as needed.  Marshell Garfinkel MD [facility] Pager [phone] 03/21/2017, 3:27 PM  CC: Velna Hatchet, MD

## 2017-03-23 DIAGNOSIS — E559 Vitamin D deficiency, unspecified: Secondary | ICD-10-CM | POA: Diagnosis not present

## 2017-03-23 DIAGNOSIS — M810 Age-related osteoporosis without current pathological fracture: Secondary | ICD-10-CM | POA: Diagnosis not present

## 2017-04-04 ENCOUNTER — Ambulatory Visit (INDEPENDENT_AMBULATORY_CARE_PROVIDER_SITE_OTHER): Payer: Medicare Other

## 2017-04-04 ENCOUNTER — Ambulatory Visit (INDEPENDENT_AMBULATORY_CARE_PROVIDER_SITE_OTHER): Payer: Medicare Other | Admitting: *Deleted

## 2017-04-04 DIAGNOSIS — Z7901 Long term (current) use of anticoagulants: Secondary | ICD-10-CM

## 2017-04-04 DIAGNOSIS — I4891 Unspecified atrial fibrillation: Secondary | ICD-10-CM

## 2017-04-04 DIAGNOSIS — I359 Nonrheumatic aortic valve disorder, unspecified: Secondary | ICD-10-CM

## 2017-04-04 DIAGNOSIS — Z5181 Encounter for therapeutic drug level monitoring: Secondary | ICD-10-CM

## 2017-04-04 DIAGNOSIS — Z95810 Presence of automatic (implantable) cardiac defibrillator: Secondary | ICD-10-CM | POA: Diagnosis not present

## 2017-04-04 DIAGNOSIS — I255 Ischemic cardiomyopathy: Secondary | ICD-10-CM | POA: Diagnosis not present

## 2017-04-04 DIAGNOSIS — I059 Rheumatic mitral valve disease, unspecified: Secondary | ICD-10-CM | POA: Diagnosis not present

## 2017-04-04 LAB — POCT INR: INR: 2.2

## 2017-04-04 NOTE — Progress Notes (Signed)
EPIC Encounter for ICM Monitoring  Patient Name: [name] is a 77 y.o. female Date: 04/04/2017 Primary Care Physican: Velna Hatchet, MD Primary Cardiologist:Crenshaw Electrophysiologist: Allred Dry Weight: 146 lbs       Heart Failure questions reviewed, pt asymptomatic.   Thoracic impedance normal but was abnormal suggesting fluid accumulation from 03/26/2017 to 04/03/2017.  Prescribed dosage: Furosemide 20 mg by mouth daily. Can take an extra 1 tablet daily as needed for swelling  LABS: 11/30/2016 Creatinine 1.49, BUN 28, Potassium 5.2, Sodium 142, EGFR 34-39 05/07/2016 Creatinine 1.4, BUN 33, Potassium 5.2, Sodium 138, EGFR 36.8 to 44.5 12/09/2015 Creatinine 1.81, BUN 29, Potassium 4.0, Sodium 140  03/20/2015 Creatinine 1.53, BUN 21, Potassium 4.4, Sodium 139 12/11/2014 Creatinine 1.60, BUN 34, Potassium 3.8, Sodium 139 12/09/2014 Creatinine 1.72, BUN 32, Potassium 3.8, Sodium 138 11/11/2014 Creatinine 1.50, BUN 21, Potassium 4.2, Sodium 140.  Recommendations: No changes.  Encouraged to call for fluid symptoms.  Follow-up plan: ICM clinic phone appointment on 05/05/2017.  Office appointment scheduled 05/09/2017 with Dr. Rayann Heman.  Copy of ICM check sent to Dr. Rayann Heman.   3 month ICM trend: 04/04/2017    1 Year ICM trend:       Rosalene Billings, RN 04/04/2017 12:56 PM

## 2017-04-04 NOTE — Patient Instructions (Signed)
Description   Continue on same dosage 1/2 tablet daily except 1 tablet Sundays, Tuesdays, and Thursdays.  Recheck in 6 weeks. Call if any new medications [phone].      

## 2017-04-18 IMAGING — CT CT L SPINE W/ CM
3 series · 12 of 33 positions shown, 14 images · IV contrast (omnipaque)
Comparison: Abdomen and pelvis CT dated 03/13/2012.

CLINICAL DATA: Back pain for several days. Diabetes. Taking
Coumadin. Unable to get an MRI due to an AICD.

EXAM:
CT LUMBAR SPINE WITH CONTRAST
TECHNIQUE: Multidetector CT imaging of the lumbar spine was performed with
intravenous contrast administration. Multiplanar CT image
reconstructions were also generated.
CONTRAST:  80mL OMNIPAQUE IOHEXOL 300 MG/ML  SOLN

[Series 2: l-spine · axial · 0.33mm/px · z∈[+1217,+1377]mm · 4 of 116 slices shown, 5 images]
[im 18/116  soft-tissue]
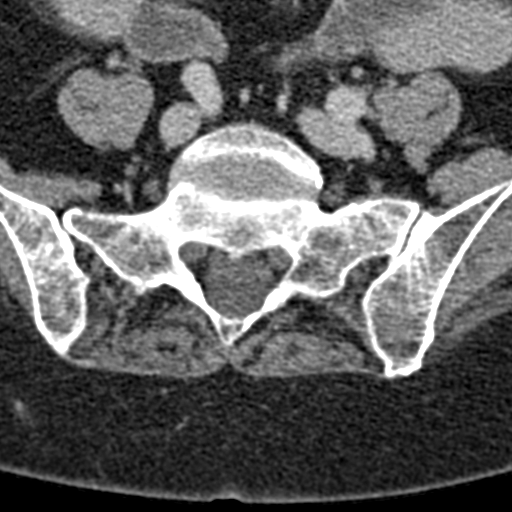
[im 18/116  bone]
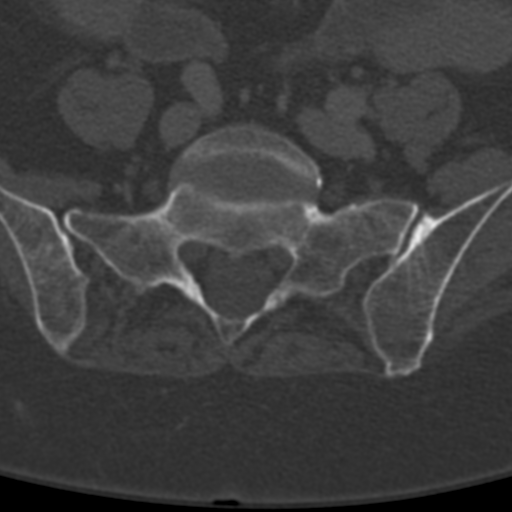
[im 45/116  bone]
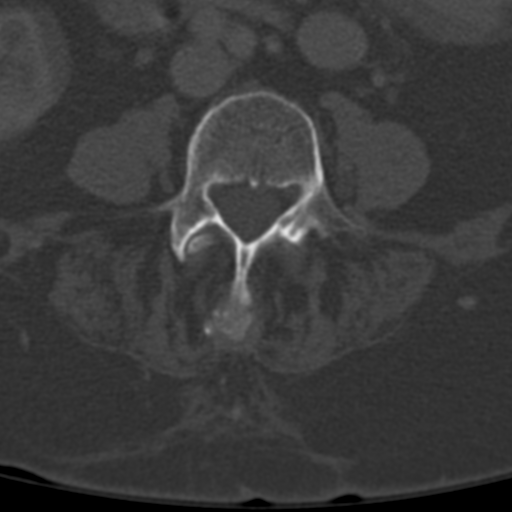
[im 71/116  bone]
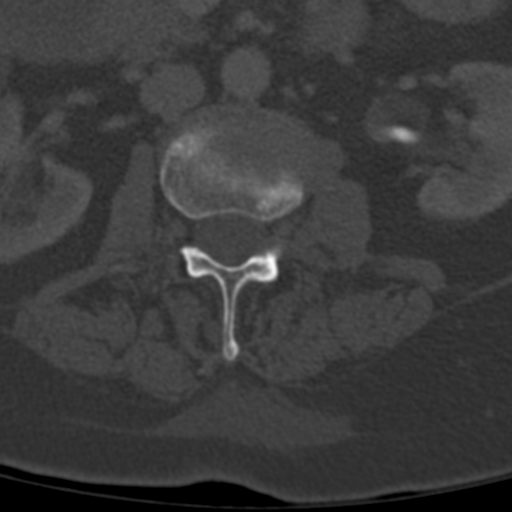
[im 98/116  bone]
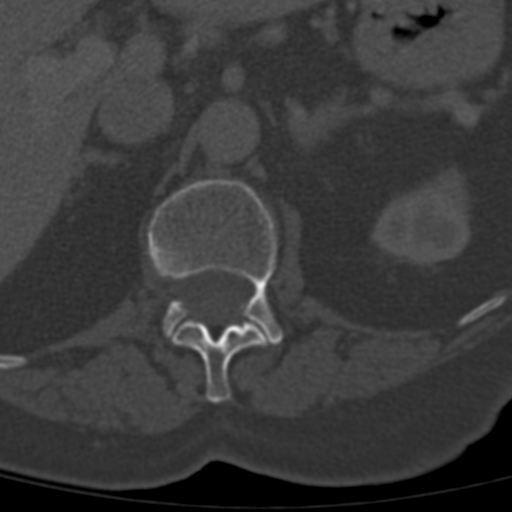

[Series 7: sagittal st · sagittal · 0.37mm/px · 5 of 66 slices shown, 6 images]
[im 22/66  bone]
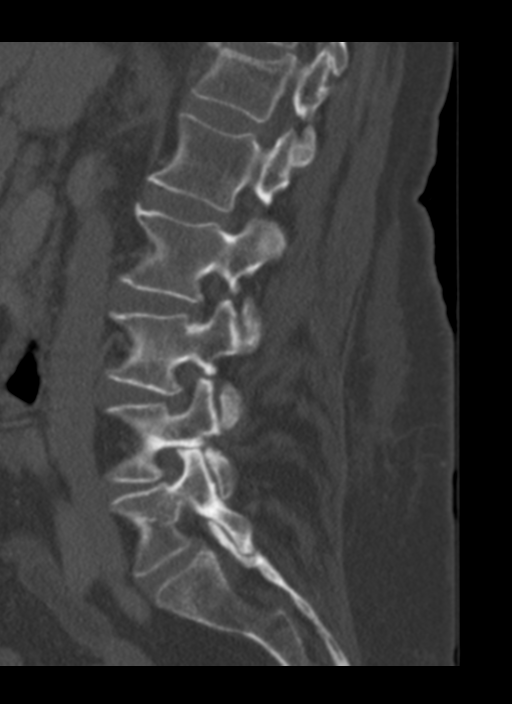
[im 28/66  bone]
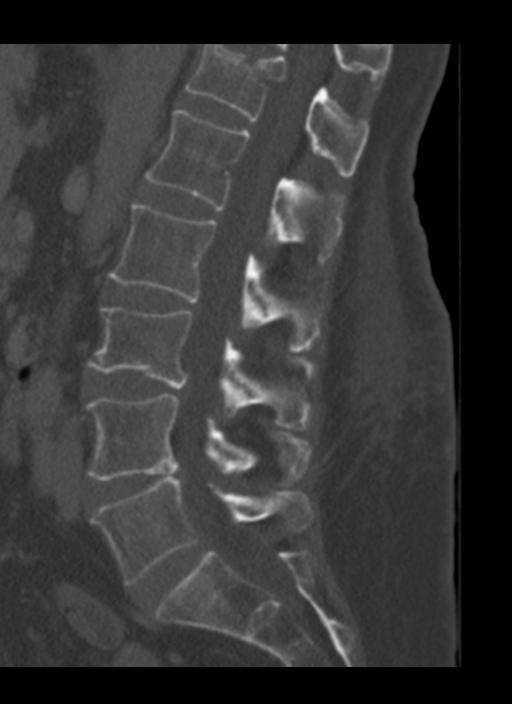
[im 33/66  soft-tissue]
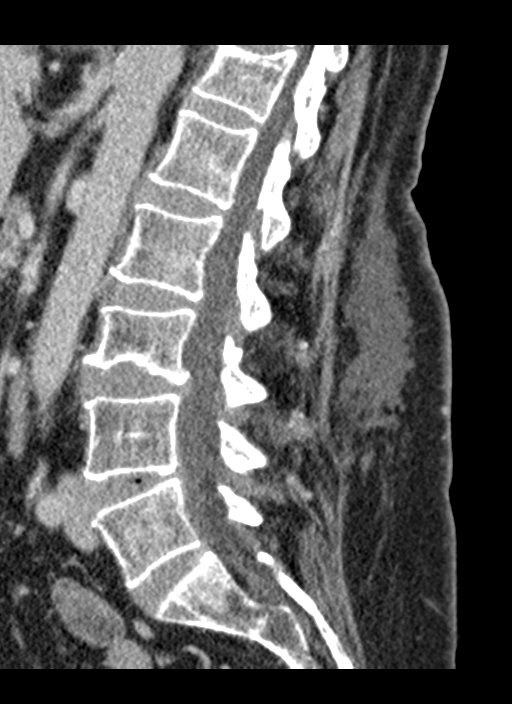
[im 33/66  bone]
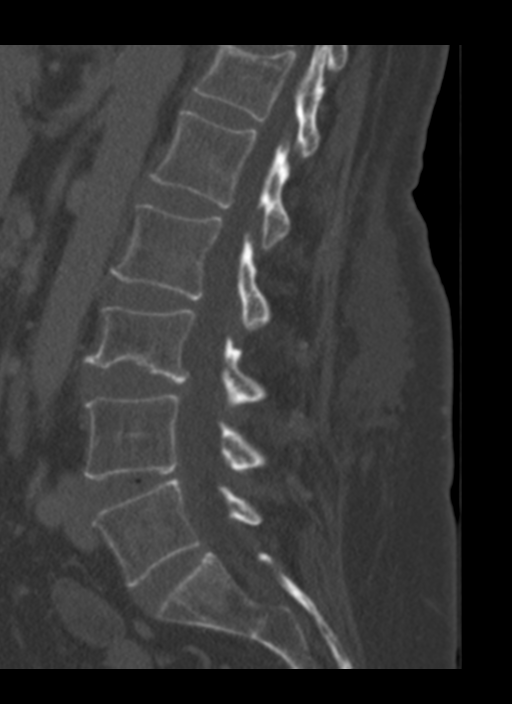
[im 38/66  bone]
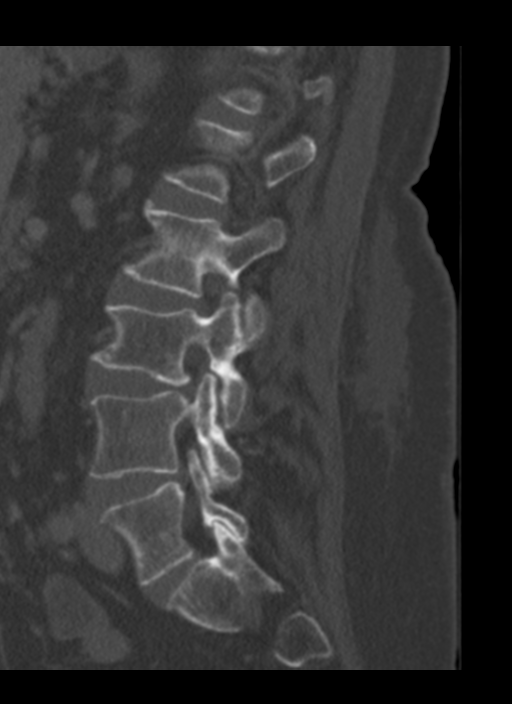
[im 44/66  bone]
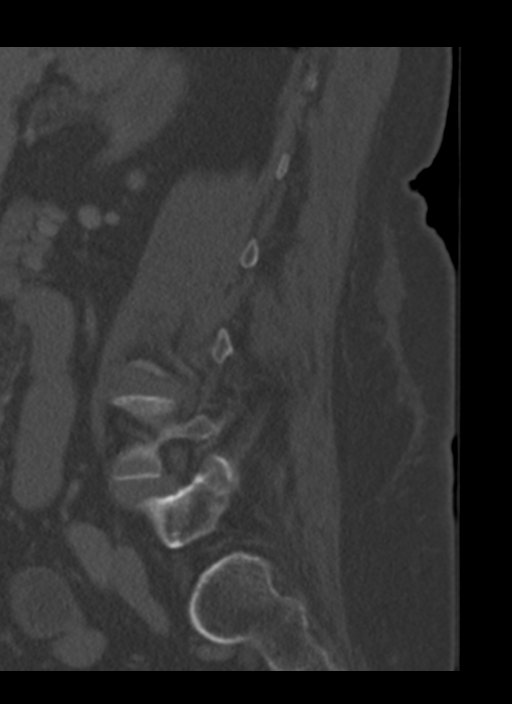

[Series 10: coronal st · coronal · 0.38mm/px · 3 of 76 slices shown]
[im 16/76  bone]
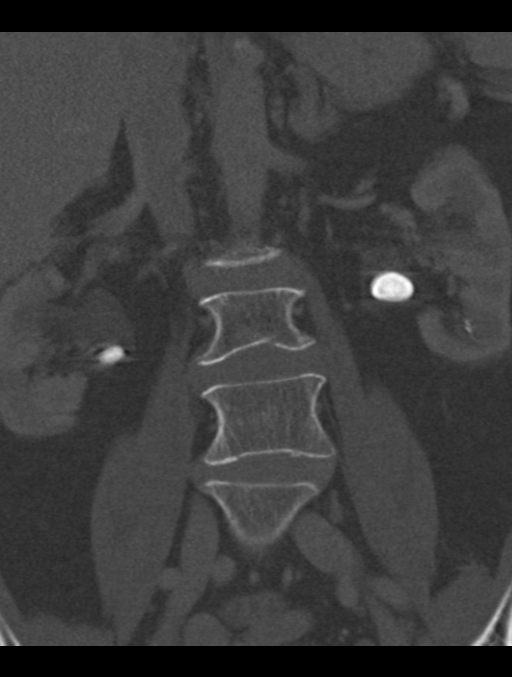
[im 31/76  bone]
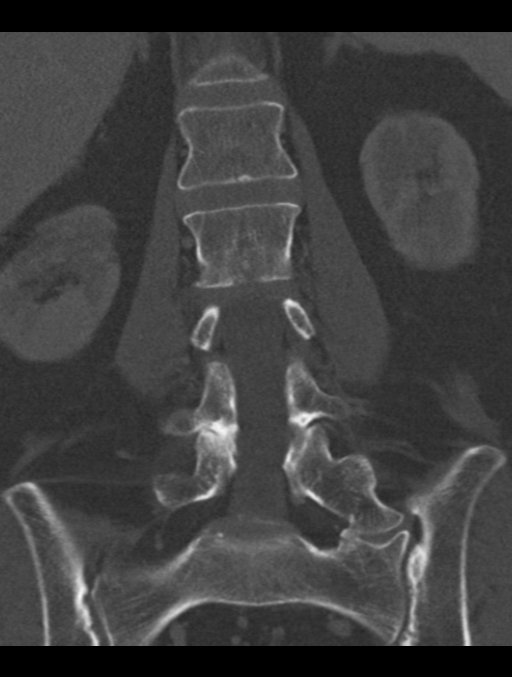
[im 46/76  bone]
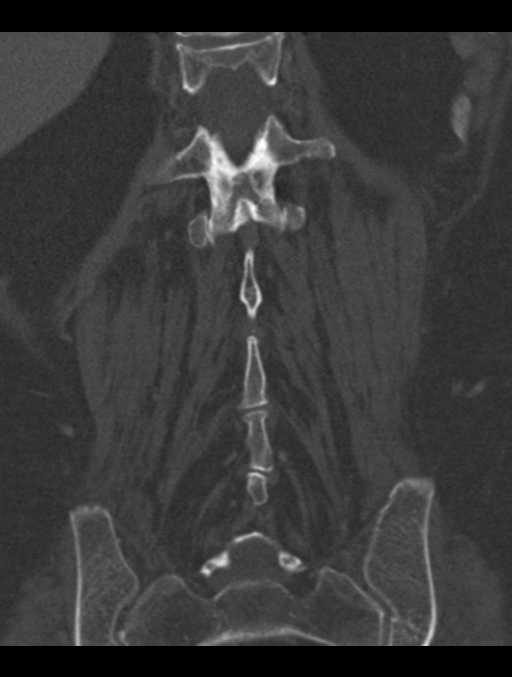

[12 of 33 positions shown; findings below may reference images not displayed]

FINDINGS: Five non-rib-bearing lumbar vertebrae.  Mild levoconvex scoliosis.

Interval approximately 15% T12 superior endplate compression
deformity and sclerosis. No acute fracture lines. 5 mm of bony
retropulsion. Mild associated canal narrowing without foraminal
stenosis.

T12-L1:  Unremarkable.

L1-2:  Minimal anterior spur formation.

L2-3: Mild diffuse disc bulging and mild bilateral facet
hypertrophy, causing minimal canal stenosis, mild foraminal stenosis
on the left and minimal foraminal stenosis on the right.

L3-4: Stable 20% L3 inferior endplate compression deformity with
resolution of previously demonstrated sclerosis. Mild bony
retropulsion. Mild diffuse disc bulging. Mild ligamentum flavum
hypertrophy on the left. These changes are producing mild canal
stenosis and mild bilateral foraminal stenosis.

L4-5: Moderate diffuse disc bulging and mild to moderate bilateral
facet hypertrophy. These changes are producing moderate canal
stenosis and mild to moderate bilateral foraminal stenosis.

L5-S1: Minimal diffuse disc bulging and mild facet hypertrophy on
the right without significant canal or foraminal stenosis.

No focal disc herniations are demonstrated. There is no evidence of
nerve root compression any level. There are no findings suspicious
for discitis. No epidural hemorrhage is seen.

Bilateral renal pelvis calculi are demonstrated. The calculus on the
left measures 1.5 cm on coronal image number 12. A calculus on the
right measures 1.0 cm on image number 12 and a second calculus on
the right measures 0.3 cm on image 13. Interval mild left
hydronephrosis.
IMPRESSION: 1. No evidence of discitis at any level.
2. No epidural hemorrhage seen at any level.
3. Degenerative and old posttraumatic changes, as described above.
4. No disc herniations are neural compression at any level.
5. Bilateral renal staghorn calculi with associated interval mild
left hydronephrosis.

## 2017-04-25 ENCOUNTER — Other Ambulatory Visit: Payer: Self-pay

## 2017-04-25 DIAGNOSIS — I255 Ischemic cardiomyopathy: Secondary | ICD-10-CM

## 2017-04-25 MED ORDER — FUROSEMIDE 20 MG PO TABS: ORAL_TABLET | ORAL | 3 refills | Status: DC

## 2017-05-04 ENCOUNTER — Telehealth: Payer: Self-pay | Admitting: Internal Medicine

## 2017-05-04 ENCOUNTER — Other Ambulatory Visit: Payer: Self-pay | Admitting: Internal Medicine

## 2017-05-04 DIAGNOSIS — I255 Ischemic cardiomyopathy: Secondary | ICD-10-CM

## 2017-05-04 NOTE — Telephone Encounter (Signed)
Spoke with Raven at [facility] and advised her that our CVRR clinic and Dr. Rayann Heman are aware that patient is on amiodarone and warfarin and that she is being monitored regularly. She thanked me for the call.

## 2017-05-04 NOTE — Telephone Encounter (Signed)
New Message   Pt c/o medication issue:  1. Name of Medication: Amiodararone  2. How are you currently taking this medication (dosage and times per day)? Not taking yet  3. Are you having a reaction (difficulty breathing--STAT)? N/A  4. What is your medication issue? Per pharmacist Dr. Rayann Heman called this prescription in & its a med interaction with Warfarin. Requesting call back

## 2017-05-05 ENCOUNTER — Ambulatory Visit (INDEPENDENT_AMBULATORY_CARE_PROVIDER_SITE_OTHER): Payer: Medicare Other

## 2017-05-05 ENCOUNTER — Telehealth: Payer: Self-pay

## 2017-05-05 DIAGNOSIS — I255 Ischemic cardiomyopathy: Secondary | ICD-10-CM | POA: Diagnosis not present

## 2017-05-05 DIAGNOSIS — Z95810 Presence of automatic (implantable) cardiac defibrillator: Secondary | ICD-10-CM | POA: Diagnosis not present

## 2017-05-05 NOTE — Progress Notes (Signed)
EPIC Encounter for ICM Monitoring  Patient Name: [name] is a 77 y.o. female Date: 05/05/2017 Primary Care Physican: Velna Hatchet, MD Primary Cardiologist:Crenshaw Electrophysiologist: Allred Dry Weight:Previous weight 146 lbs      Attempted call to patient and unable to reach.  Left detailed message regarding transmission.  Transmission reviewed.    Thoracic impedance normal.  Prescribed dosage: Furosemide 20 mg by mouth daily. Can take an extra 1 tablet daily as needed for swelling  LABS: 11/30/2016 Creatinine 1.49, BUN 28, Potassium 5.2, Sodium 142, EGFR 34-39 05/07/2016 Creatinine 1.4, BUN 33, Potassium 5.2, Sodium 138, EGFR 36.8 to 44.5 12/09/2015 Creatinine 1.81, BUN 29, Potassium 4.0, Sodium 140  03/20/2015 Creatinine 1.53, BUN 21, Potassium 4.4, Sodium 139 12/11/2014 Creatinine 1.60, BUN 34, Potassium 3.8, Sodium 139 12/09/2014 Creatinine 1.72, BUN 32, Potassium 3.8, Sodium 138 11/11/2014 Creatinine 1.50, BUN 21, Potassium 4.2, Sodium 140.  Recommendations: Left voice mail with ICM number and encouraged to call if experiencing any fluid symptoms.  Follow-up plan: ICM clinic phone appointment on 06/09/2017.  Office appointment scheduled 05/09/2017 with Dr. Rayann Heman.  Copy of ICM check sent to Dr. Rayann Heman.   3 month ICM trend: 05/05/2017    1 Year ICM trend:       Rosalene Billings, RN 05/05/2017 8:44 AM

## 2017-05-05 NOTE — Telephone Encounter (Signed)
Remote ICM transmission received.  Attempted call to patient and left detailed message per DPR regarding transmission and next ICM scheduled for 06/09/2017.  Advised to return call for any fluid symptoms or questions.    

## 2017-05-09 ENCOUNTER — Encounter: Payer: Self-pay | Admitting: Internal Medicine

## 2017-05-09 ENCOUNTER — Ambulatory Visit (INDEPENDENT_AMBULATORY_CARE_PROVIDER_SITE_OTHER): Payer: Medicare Other | Admitting: Internal Medicine

## 2017-05-09 VITALS — BP 126/62 | HR 63 | Ht 61.0 in | Wt 154.0 lb

## 2017-05-09 DIAGNOSIS — I25728 Atherosclerosis of autologous artery coronary artery bypass graft(s) with other forms of angina pectoris: Secondary | ICD-10-CM | POA: Diagnosis not present

## 2017-05-09 DIAGNOSIS — Z95810 Presence of automatic (implantable) cardiac defibrillator: Secondary | ICD-10-CM | POA: Diagnosis not present

## 2017-05-09 DIAGNOSIS — I38 Endocarditis, valve unspecified: Secondary | ICD-10-CM | POA: Diagnosis not present

## 2017-05-09 DIAGNOSIS — I4819 Other persistent atrial fibrillation: Secondary | ICD-10-CM

## 2017-05-09 DIAGNOSIS — E559 Vitamin D deficiency, unspecified: Secondary | ICD-10-CM | POA: Diagnosis not present

## 2017-05-09 DIAGNOSIS — R82998 Other abnormal findings in urine: Secondary | ICD-10-CM | POA: Diagnosis not present

## 2017-05-09 DIAGNOSIS — I481 Persistent atrial fibrillation: Secondary | ICD-10-CM

## 2017-05-09 DIAGNOSIS — I255 Ischemic cardiomyopathy: Secondary | ICD-10-CM | POA: Diagnosis not present

## 2017-05-09 DIAGNOSIS — N183 Chronic kidney disease, stage 3 (moderate): Secondary | ICD-10-CM | POA: Diagnosis not present

## 2017-05-09 DIAGNOSIS — E11638 Type 2 diabetes mellitus with other oral complications: Secondary | ICD-10-CM | POA: Diagnosis not present

## 2017-05-09 LAB — CUP PACEART INCLINIC DEVICE CHECK
Battery Remaining Longevity: 50 mo
Brady Statistic RA Percent Paced: 67 %
Brady Statistic RV Percent Paced: 0.01 %
Date Time Interrogation Session: 20190304164550
HighPow Impedance: 57.375
Implantable Lead Location: 753860
Lead Channel Impedance Value: 587.5 Ohm
Lead Channel Pacing Threshold Amplitude: 1 V
Lead Channel Pacing Threshold Pulse Width: 0.5 ms
Lead Channel Pacing Threshold Pulse Width: 0.5 ms
Lead Channel Pacing Threshold Pulse Width: 0.5 ms
Lead Channel Setting Pacing Amplitude: 2 V
Lead Channel Setting Sensing Sensitivity: 0.5 mV
MDC IDC LEAD IMPLANT DT: 20140403
MDC IDC LEAD IMPLANT DT: 20140403
MDC IDC LEAD LOCATION: 753859
MDC IDC MSMT LEADCHNL RA IMPEDANCE VALUE: 412.5 Ohm
MDC IDC MSMT LEADCHNL RA PACING THRESHOLD AMPLITUDE: 1 V
MDC IDC MSMT LEADCHNL RA SENSING INTR AMPL: 2.3 mV
MDC IDC MSMT LEADCHNL RV PACING THRESHOLD AMPLITUDE: 1 V
MDC IDC MSMT LEADCHNL RV PACING THRESHOLD AMPLITUDE: 1 V
MDC IDC MSMT LEADCHNL RV PACING THRESHOLD PULSEWIDTH: 0.5 ms
MDC IDC MSMT LEADCHNL RV SENSING INTR AMPL: 12 mV
MDC IDC PG IMPLANT DT: 20140403
MDC IDC PG SERIAL: 7080516
MDC IDC SET LEADCHNL RV PACING AMPLITUDE: 2.5 V
MDC IDC SET LEADCHNL RV PACING PULSEWIDTH: 0.5 ms

## 2017-05-09 NOTE — Patient Instructions (Addendum)
Medication Instructions:  Your physician recommends that you continue on your current medications as directed. Please refer to the Current Medication list given to you today.  Labwork: None ordered.  Testing/Procedures: None ordered.  Follow-Up: Your physician wants you to follow-up in: one year with Chanetta Marshall, NP.   You will receive a reminder letter in the mail two months in advance. If you don't receive a letter, please call our office to schedule the follow-up appointment.  Remote monitoring is used to monitor your ICD from home. This monitoring reduces the number of office visits required to check your device to one time per year. It allows [place] to keep an eye on the functioning of your device to ensure it is working properly. You are scheduled for a device check from home on 06/09/2017. You may send your transmission at any time that day. If you have a wireless device, the transmission will be sent automatically. After your physician reviews your transmission, you will receive a postcard with your next transmission date.  Any Other Special Instructions Will Be Listed Below (If Applicable).  If you need a refill on your cardiac medications before your next appointment, please call your pharmacy.

## 2017-05-09 NOTE — Progress Notes (Signed)
PCP: Velna Hatchet, MD Primary Cardiologist:  Dr Stanford Breed Primary EP: Dr Suanne Marker [name] is a 77 y.o. female who presents today for routine electrophysiology followup.  Since last being seen in our clinic, the patient reports doing very well.  Today, she denies symptoms of palpitations, chest pain, shortness of breath,  lower extremity edema, dizziness, presyncope, syncope, or ICD shocks.  The patient is otherwise without complaint today.   Past Medical History:  Diagnosis Date  . AICD (automatic cardioverter/defibrillator) present    2014  . Anemia   . Arthritis    OSTEO  . Atrial fibrillation [facility])    A.fib/flutter: s/p MAZE 2008, DCCV 2011, on coumadin; previously on flecainide, but dc'd due to worsening EF. Tikosyn discontinued 06/2012 after cardiac arrest.  . Carcinoma in situ of vulva   . Chronic systolic CHF (congestive heart failure) (HCC)    EF 35%  . Coronary artery disease    Occluded LM 2/2 previous aortic root surgery; s/p 2v CABG 2010 LIMA to LAD, SVG to OM; Cath 03/11/12 patent SVG to OM, atretic LIMA to LAD, patent RCA, occluded native LM, EF 35%   . CVA (cerebral vascular accident) ([place])    2008 - felt due to oscillating calcium on aortic valve  . Diabetes mellitus   . Diverticulitis   . Dysrhythmia   . Fibroid   . Fracture of lumbar spine ([place])   . GERD (gastroesophageal reflux disease)   . Hypertension   . Ischemic cardiomyopathy    EF 35%  . Kidney stones   . Mitral regurgitation    s/p mitral valve repair 2008  . Osteoporosis   . Ovarian cyst, left 2008-2009  . Severe aortic stenosis    s/p homograft aortic root replacement 2008  . Stroke ([place])   . Superficial venous thrombosis of upper extremity    06/2012 - LUE  . Thoracic aortic aneurysm [facility])    s/p resection and grafting 2008  . Ventricular fibrillation ([place])    VF arrest/VT/torsades 06/2012 with prolonged hosp with VDRF, cardiogen shock, asp PNA, encephalopathy, anemia, shock liver,  hypernatremia - s/p ICD implantation 06/08/12.   Past Surgical History:  Procedure Laterality Date  . aneurysm and left sided maze  2008  . AORTIC ROOT REPLACEMENT  2008   homograft  . CARDIAC DEFIBRILLATOR PLACEMENT  06/08/2012   [employer]. Jude Medical Fortify Assura DR  implanted by Dr Rayann Heman following a cardiac arrest  . CATARACT EXTRACTION    . CORONARY ARTERY BYPASS GRAFT  4/10   LIMA to LAD, SVG to OM  . EYE SURGERY    . FOOT SURGERY Left    removed neuroma  . IMPLANTABLE CARDIOVERTER DEFIBRILLATOR IMPLANT N/A 06/08/2012   Procedure: IMPLANTABLE CARDIOVERTER DEFIBRILLATOR IMPLANT;  Surgeon: Thompson Grayer, MD;  Location: [facility] CATH LAB;  Service: Cardiovascular;  Laterality: N/A;  . KYPHOPLASTY N/A 12/11/2014   Procedure: KYPHOPLASTY T-12;  Surgeon: Karie Chimera, MD;  Location: [place] NEURO ORS;  Service: Neurosurgery;  Laterality: N/A;  KYPHOPLASTY T-12  . MITRAL VALVE REPLACEMENT  2008   due to severe MR  . Resection and grafting of ascending thoracic aortic    . RIGHT HEART CATHETERIZATION N/A 04/03/2012   Procedure: RIGHT HEART CATH;  Surgeon: [name], MD;  Location: [facility] CATH LAB;  Service: Cardiovascular;  Laterality: N/A;  . WIDE EXCISION OF VULVA     CA INSITU    ROS- all systems are reviewed and negative except as per HPI above  Current Outpatient Medications  Medication Sig Dispense Refill  . carvedilol (COREG) 12.5 MG tablet Take 1 tablet (12.5 mg total) by mouth 2 (two) times daily with a meal. 180 tablet 2  . Cyanocobalamin (VITAMIN B-12) 2500 MCG SUBL Place 1 tablet under the tongue every Monday, Wednesday, and Friday.     . ergocalciferol (VITAMIN D2) 50000 UNITS capsule Take 50,000 Units by mouth once a week. Monday    . furosemide (LASIX) 20 MG tablet TAKE ONE TABLET ONCE DAILY AND TAKE 1 EXTRA AS NEEDED 100 tablet 3  . losartan (COZAAR) 50 MG tablet TAKE 1 TABLET (50 MG TOTAL) BY MOUTH DAILY. 90 tablet 3  . NON FORMULARY ([employer]) Take 500 mg of elemental calcium by  mouth at bedtime    . PACERONE 200 MG tablet TAKE 1/2 TABLET BY MOUTH DAILY. 45 tablet 1  . Psyllium (METAMUCIL PO) Take 1 teaspoon by mouth daily with water    . warfarin (COUMADIN) 3 MG tablet TAKE 1/2 TO 1 TABLET BY MOUTH DAILY AS DIRECTED BY COUMADIN CLINIC 30 tablet 3   No current facility-administered medications for this visit.     Physical Exam: Vitals:   05/09/17 1505  BP: 126/62  Pulse: 63  Weight: 154 lb (69.9 kg)  Height: 5\' 1"  (1.549 m)    GEN- The patient is well appearing, alert and oriented x 3 today.   Head- normocephalic, atraumatic Eyes-  Sclera clear, conjunctiva pink Ears- hearing intact Oropharynx- clear Lungs- Clear to ausculation bilaterally, normal work of breathing Chest- ICD pocket is well healed Heart- Regular rate and rhythm, no murmurs, rubs or gallops, PMI not laterally displaced GI- soft, NT, ND, + BS Extremities- no clubbing, cyanosis, or edema  ICD interrogation- reviewed in detail today,  See PACEART report  ekg tracing ordered today is personally reviewed and shows atrial paced rhythm, IVCD, Qtc 431 msec  Assessment and Plan:  1.  Chronic systolic dysfunction/ ischemic CM euvolemic today Stable on an appropriate medical regimen Normal ICD function See Pace Art report No changes today  2. Persistent afib Maintaining sinus rhythm with amiodarone 100mg  daily Dr Ardeth Perfect following LFTs, TFTs Labs 11/30/16 reviewed Chest CT 01/10/17 reveals no ILD Could consider reducing amiodarone to 100mg  QOD if she remains free of afib on return  3. S/p VF arrest Normal ICD function See Pace Art report No changes today   [place] Return to see EP NP in 1 year Follow-up with Dr Stanford Breed as scheduled  Thompson Grayer MD, [facility] 05/09/2017 3:09 PM

## 2017-05-16 ENCOUNTER — Ambulatory Visit (INDEPENDENT_AMBULATORY_CARE_PROVIDER_SITE_OTHER): Payer: Medicare Other | Admitting: *Deleted

## 2017-05-16 DIAGNOSIS — Z5181 Encounter for therapeutic drug level monitoring: Secondary | ICD-10-CM

## 2017-05-16 DIAGNOSIS — I059 Rheumatic mitral valve disease, unspecified: Secondary | ICD-10-CM

## 2017-05-16 DIAGNOSIS — N183 Chronic kidney disease, stage 3 (moderate): Secondary | ICD-10-CM | POA: Diagnosis not present

## 2017-05-16 DIAGNOSIS — Z7901 Long term (current) use of anticoagulants: Secondary | ICD-10-CM | POA: Diagnosis not present

## 2017-05-16 DIAGNOSIS — I130 Hypertensive heart and chronic kidney disease with heart failure and stage 1 through stage 4 chronic kidney disease, or unspecified chronic kidney disease: Secondary | ICD-10-CM | POA: Diagnosis not present

## 2017-05-16 DIAGNOSIS — I4891 Unspecified atrial fibrillation: Secondary | ICD-10-CM

## 2017-05-16 DIAGNOSIS — I5022 Chronic systolic (congestive) heart failure: Secondary | ICD-10-CM | POA: Diagnosis not present

## 2017-05-16 DIAGNOSIS — I10 Essential (primary) hypertension: Secondary | ICD-10-CM | POA: Diagnosis not present

## 2017-05-16 DIAGNOSIS — E559 Vitamin D deficiency, unspecified: Secondary | ICD-10-CM | POA: Diagnosis not present

## 2017-05-16 DIAGNOSIS — E1121 Type 2 diabetes mellitus with diabetic nephropathy: Secondary | ICD-10-CM | POA: Diagnosis not present

## 2017-05-16 DIAGNOSIS — Z6828 Body mass index (BMI) 28.0-28.9, adult: Secondary | ICD-10-CM | POA: Diagnosis not present

## 2017-05-16 DIAGNOSIS — M810 Age-related osteoporosis without current pathological fracture: Secondary | ICD-10-CM | POA: Diagnosis not present

## 2017-05-16 DIAGNOSIS — Z0000 Encounter for general adult medical examination without abnormal findings: Secondary | ICD-10-CM | POA: Diagnosis not present

## 2017-05-16 DIAGNOSIS — I359 Nonrheumatic aortic valve disorder, unspecified: Secondary | ICD-10-CM | POA: Diagnosis not present

## 2017-05-16 DIAGNOSIS — I480 Paroxysmal atrial fibrillation: Secondary | ICD-10-CM | POA: Diagnosis not present

## 2017-05-16 DIAGNOSIS — Z1389 Encounter for screening for other disorder: Secondary | ICD-10-CM | POA: Diagnosis not present

## 2017-05-16 LAB — POCT INR: INR: 2.2

## 2017-05-16 NOTE — Patient Instructions (Signed)
Description   Continue on same dosage 1/2 tablet daily except 1 tablet Sundays, Tuesdays, and Thursdays.  Recheck in 6 weeks. Call if any new medications [phone].      

## 2017-05-17 DIAGNOSIS — Z1212 Encounter for screening for malignant neoplasm of rectum: Secondary | ICD-10-CM | POA: Diagnosis not present

## 2017-05-30 DIAGNOSIS — R195 Other fecal abnormalities: Secondary | ICD-10-CM | POA: Diagnosis not present

## 2017-06-09 ENCOUNTER — Telehealth: Payer: Self-pay | Admitting: Cardiology

## 2017-06-09 ENCOUNTER — Ambulatory Visit (INDEPENDENT_AMBULATORY_CARE_PROVIDER_SITE_OTHER): Payer: Medicare Other | Admitting: *Deleted

## 2017-06-09 DIAGNOSIS — I255 Ischemic cardiomyopathy: Secondary | ICD-10-CM

## 2017-06-09 DIAGNOSIS — I509 Heart failure, unspecified: Secondary | ICD-10-CM | POA: Diagnosis not present

## 2017-06-09 DIAGNOSIS — Z95810 Presence of automatic (implantable) cardiac defibrillator: Secondary | ICD-10-CM

## 2017-06-09 NOTE — Telephone Encounter (Signed)
Spoke with pt and reminded pt of remote transmission that is due today. Pt verbalized understanding.   

## 2017-06-09 NOTE — Progress Notes (Signed)
Remote ICD transmission.   

## 2017-06-10 NOTE — Progress Notes (Signed)
EPIC Encounter for ICM Monitoring  Patient Name: [name] is a 77 y.o. female Date: 06/10/2017 Primary Care Physican: Velna Hatchet, MD Primary Cardiologist:Crenshaw Electrophysiologist: Allred Dry Weight: 150 lbs  Heart Failure questions reviewed, pt asymptomatic.   Thoracic impedance normal but was abnormal suggesting fluid accumulation from 05/21/2017 through 06/01/2017.  Prescribed dosage: Furosemide 20 mg by mouth daily. Can take an extra 1 tablet daily as needed for swelling  LABS: 11/30/2016 Creatinine 1.49, BUN 28, Potassium 5.2, Sodium 142, EGFR 34-39 05/07/2016 Creatinine 1.4, BUN 33, Potassium 5.2, Sodium 138, EGFR 36.8 to 44.5 12/09/2015 Creatinine 1.81, BUN 29, Potassium 4.0, Sodium 140  03/20/2015 Creatinine 1.53, BUN 21, Potassium 4.4, Sodium 139 12/11/2014 Creatinine 1.60, BUN 34, Potassium 3.8, Sodium 139 12/09/2014 Creatinine 1.72, BUN 32, Potassium 3.8, Sodium 138 11/11/2014 Creatinine 1.50, BUN 21, Potassium 4.2, Sodium 140.  Recommendations: No changes.   Encouraged to call for fluid symptoms.  Follow-up plan: ICM clinic phone appointment on 07/11/2017.    Copy of ICM check sent to Dr. Rayann Heman.   3 month ICM trend: 06/09/2017    1 Year ICM trend:       Rosalene Billings, RN 06/10/2017 12:11 PM

## 2017-06-15 ENCOUNTER — Encounter: Payer: Self-pay | Admitting: Cardiology

## 2017-06-27 ENCOUNTER — Ambulatory Visit (INDEPENDENT_AMBULATORY_CARE_PROVIDER_SITE_OTHER): Payer: Medicare Other | Admitting: Pharmacist

## 2017-06-27 DIAGNOSIS — Z7901 Long term (current) use of anticoagulants: Secondary | ICD-10-CM | POA: Diagnosis not present

## 2017-06-27 DIAGNOSIS — I4891 Unspecified atrial fibrillation: Secondary | ICD-10-CM

## 2017-06-27 DIAGNOSIS — I059 Rheumatic mitral valve disease, unspecified: Secondary | ICD-10-CM

## 2017-06-27 DIAGNOSIS — I359 Nonrheumatic aortic valve disorder, unspecified: Secondary | ICD-10-CM

## 2017-06-27 LAB — POCT INR: INR: 2.3

## 2017-06-27 NOTE — Patient Instructions (Signed)
Description   Continue on same dosage 1/2 tablet daily except 1 tablet Sundays, Tuesdays, and Thursdays.  Recheck in 6 weeks. Call if any new medications [phone].      

## 2017-06-30 LAB — CUP PACEART REMOTE DEVICE CHECK
Battery Remaining Percentage: 50 %
Battery Voltage: 2.96 V
Brady Statistic AS VS Percent: 33 %
Brady Statistic RA Percent Paced: 65 %
Brady Statistic RV Percent Paced: 1 %
HIGH POWER IMPEDANCE MEASURED VALUE: 57 Ohm
HighPow Impedance: 57 Ohm
Implantable Lead Implant Date: 20140403
Implantable Lead Location: 753859
Implantable Pulse Generator Implant Date: 20140403
Lead Channel Impedance Value: 650 Ohm
Lead Channel Pacing Threshold Amplitude: 1 V
Lead Channel Pacing Threshold Pulse Width: 0.5 ms
Lead Channel Sensing Intrinsic Amplitude: 12 mV
Lead Channel Sensing Intrinsic Amplitude: 2 mV
Lead Channel Setting Pacing Amplitude: 2 V
Lead Channel Setting Pacing Amplitude: 2.5 V
Lead Channel Setting Pacing Pulse Width: 0.5 ms
MDC IDC LEAD IMPLANT DT: 20140403
MDC IDC LEAD LOCATION: 753860
MDC IDC MSMT BATTERY REMAINING LONGEVITY: 48 mo
MDC IDC MSMT LEADCHNL RA IMPEDANCE VALUE: 430 Ohm
MDC IDC MSMT LEADCHNL RA PACING THRESHOLD AMPLITUDE: 1 V
MDC IDC MSMT LEADCHNL RA PACING THRESHOLD PULSEWIDTH: 0.5 ms
MDC IDC SESS DTM: 20190404185026
MDC IDC SET LEADCHNL RV SENSING SENSITIVITY: 0.5 mV
MDC IDC STAT BRADY AP VP PERCENT: 1 %
MDC IDC STAT BRADY AP VS PERCENT: 67 %
MDC IDC STAT BRADY AS VP PERCENT: 1 %
Pulse Gen Serial Number: 7080516

## 2017-07-07 ENCOUNTER — Other Ambulatory Visit: Payer: Self-pay | Admitting: Cardiology

## 2017-07-07 NOTE — Telephone Encounter (Signed)
Scr 05/09/2017 per [facility] = 1.4

## 2017-07-11 ENCOUNTER — Ambulatory Visit (INDEPENDENT_AMBULATORY_CARE_PROVIDER_SITE_OTHER): Payer: Medicare Other

## 2017-07-11 DIAGNOSIS — Z95810 Presence of automatic (implantable) cardiac defibrillator: Secondary | ICD-10-CM | POA: Diagnosis not present

## 2017-07-11 DIAGNOSIS — I509 Heart failure, unspecified: Secondary | ICD-10-CM | POA: Diagnosis not present

## 2017-07-11 NOTE — Progress Notes (Signed)
EPIC Encounter for ICM Monitoring  Patient Name: [name] is a 77 y.o. female Date: 07/11/2017 Primary Care Physican: Velna Hatchet, MD Primary Cardiologist:Crenshaw Electrophysiologist: Allred Dry Weight: 145 lbs        Heart Failure questions reviewed, pt asymptomatic.   Thoracic impedance normal but was abnormal suggesting fluid accumulation from 07/05/2017 - 07/09/2017.  Prescribed dosage: Furosemide 20 mg by mouth daily. Can take an extra 1 tablet daily as needed for swelling  LABS: 11/30/2016 Creatinine 1.49, BUN 28, Potassium 5.2, Sodium 142, EGFR 34-39 05/07/2016 Creatinine 1.4, BUN 33, Potassium 5.2, Sodium 138, EGFR 36.8 to 44.5 12/09/2015 Creatinine 1.81, BUN 29, Potassium 4.0, Sodium 140  03/20/2015 Creatinine 1.53, BUN 21, Potassium 4.4, Sodium 139 12/11/2014 Creatinine 1.60, BUN 34, Potassium 3.8, Sodium 139 12/09/2014 Creatinine 1.72, BUN 32, Potassium 3.8, Sodium 138 11/11/2014 Creatinine 1.50, BUN 21, Potassium 4.2, Sodium 140.  Recommendations: No changes.  Encouraged to call for fluid symptoms.  Follow-up plan: ICM clinic phone appointment on 08/11/2017.    Copy of ICM check sent to Dr. Rayann Heman.   3 month ICM trend: 07/11/2017    1 Year ICM trend:       Rosalene Billings, RN 07/11/2017 8:29 AM

## 2017-08-08 ENCOUNTER — Ambulatory Visit (INDEPENDENT_AMBULATORY_CARE_PROVIDER_SITE_OTHER): Payer: Medicare Other | Admitting: *Deleted

## 2017-08-08 DIAGNOSIS — Z7901 Long term (current) use of anticoagulants: Secondary | ICD-10-CM

## 2017-08-08 DIAGNOSIS — I059 Rheumatic mitral valve disease, unspecified: Secondary | ICD-10-CM

## 2017-08-08 DIAGNOSIS — I4891 Unspecified atrial fibrillation: Secondary | ICD-10-CM

## 2017-08-08 DIAGNOSIS — Z5181 Encounter for therapeutic drug level monitoring: Secondary | ICD-10-CM

## 2017-08-08 DIAGNOSIS — I359 Nonrheumatic aortic valve disorder, unspecified: Secondary | ICD-10-CM

## 2017-08-08 LAB — POCT INR: INR: 2.5 (ref 2.0–3.0)

## 2017-08-08 NOTE — Patient Instructions (Signed)
Description   Continue on same dosage 1/2 tablet daily except 1 tablet Sundays, Tuesdays, and Thursdays.  Recheck in 6 weeks. Call if any new medications [phone].      

## 2017-08-11 ENCOUNTER — Ambulatory Visit (INDEPENDENT_AMBULATORY_CARE_PROVIDER_SITE_OTHER): Payer: Medicare Other

## 2017-08-11 DIAGNOSIS — Z95810 Presence of automatic (implantable) cardiac defibrillator: Secondary | ICD-10-CM

## 2017-08-11 DIAGNOSIS — I509 Heart failure, unspecified: Secondary | ICD-10-CM

## 2017-08-11 NOTE — Progress Notes (Signed)
EPIC Encounter for ICM Monitoring  Patient Name: [name] is a 77 y.o. female Date: 08/11/2017 Primary Care Physican: Velna Hatchet, MD Primary Cardiologist:Crenshaw Electrophysiologist: Allred Dry Weight: 150lbs      Heart Failure questions reviewed, pt asymptomatic.  She stated she is feeling fine.    Thoracic impedance normal but was abnormal suggesting fluid accumulation from 07/24/2017 - 08/03/2017.  Prescribed dosage: Furosemide 20 mg by mouth daily. Can take an extra 1 tablet daily as needed for swelling  LABS: 11/30/2016 Creatinine 1.49, BUN 28, Potassium 5.2, Sodium 142, EGFR 34-39 05/07/2016 Creatinine 1.4, BUN 33, Potassium 5.2, Sodium 138, EGFR 36.8 to 44.5 12/09/2015 Creatinine 1.81, BUN 29, Potassium 4.0, Sodium 140  03/20/2015 Creatinine 1.53, BUN 21, Potassium 4.4, Sodium 139 12/11/2014 Creatinine 1.60, BUN 34, Potassium 3.8, Sodium 139 12/09/2014 Creatinine 1.72, BUN 32, Potassium 3.8, Sodium 138 11/11/2014 Creatinine 1.50, BUN 21, Potassium 4.2, Sodium 140.  Recommendations: No changes.   Encouraged to call for fluid symptoms.  Follow-up plan: ICM clinic phone appointment on 09/12/2017.    Copy of ICM check sent to Dr. Rayann Heman.   3 month ICM trend: 08/11/2017    1 Year ICM trend:       Rosalene Billings, RN 08/11/2017 12:36 PM

## 2017-08-30 NOTE — Telephone Encounter (Signed)
This note was entered by mistake. °

## 2017-09-12 ENCOUNTER — Ambulatory Visit (INDEPENDENT_AMBULATORY_CARE_PROVIDER_SITE_OTHER): Payer: Medicare Other

## 2017-09-12 ENCOUNTER — Ambulatory Visit (INDEPENDENT_AMBULATORY_CARE_PROVIDER_SITE_OTHER): Payer: Medicare Other | Admitting: *Deleted

## 2017-09-12 DIAGNOSIS — Z95810 Presence of automatic (implantable) cardiac defibrillator: Secondary | ICD-10-CM

## 2017-09-12 DIAGNOSIS — I255 Ischemic cardiomyopathy: Secondary | ICD-10-CM

## 2017-09-12 DIAGNOSIS — I509 Heart failure, unspecified: Secondary | ICD-10-CM

## 2017-09-12 DIAGNOSIS — I4891 Unspecified atrial fibrillation: Secondary | ICD-10-CM

## 2017-09-12 NOTE — Progress Notes (Signed)
Remote ICD transmission.   

## 2017-09-12 NOTE — Progress Notes (Signed)
EPIC Encounter for ICM Monitoring  Patient Name: [name] is a 77 y.o. female Date: 09/12/2017 Primary Care Physican: Velna Hatchet, MD Primary Cardiologist:Crenshaw Electrophysiologist: Allred Dry Weight: 152lbs       Heart Failure questions reviewed, pt asymptomatic.   Thoracic impedance normal but was abnormal suggesting fluid accumulation from 09/02/2017 - 09/09/2017.  Prescribed dosage: Furosemide 20 mg by mouth daily. Can take an extra 1 tablet daily as needed for swelling.  LABS: 11/30/2016 Creatinine 1.49, BUN 28, Potassium 5.2, Sodium 142, EGFR 34-39 05/07/2016 Creatinine 1.4, BUN 33, Potassium 5.2, Sodium 138, EGFR 36.8 to 44.5  Recommendations: No changes.  Encouraged to call for fluid symptoms.  Follow-up plan: ICM clinic phone appointment on 10/17/2017.    Appointments: Office appointment scheduled 11/28/2017 with Dr. Stanford Breed.  Copy of ICM check sent to Dr. Rayann Heman.   3 month ICM trend: 09/12/2017    1 Year ICM trend:        Rosalene Billings, RN 09/12/2017 1:31 PM

## 2017-09-19 ENCOUNTER — Ambulatory Visit (INDEPENDENT_AMBULATORY_CARE_PROVIDER_SITE_OTHER): Payer: Medicare Other | Admitting: Pharmacist

## 2017-09-19 DIAGNOSIS — I4891 Unspecified atrial fibrillation: Secondary | ICD-10-CM

## 2017-09-19 DIAGNOSIS — E559 Vitamin D deficiency, unspecified: Secondary | ICD-10-CM | POA: Diagnosis not present

## 2017-09-19 DIAGNOSIS — I059 Rheumatic mitral valve disease, unspecified: Secondary | ICD-10-CM

## 2017-09-19 DIAGNOSIS — Z7901 Long term (current) use of anticoagulants: Secondary | ICD-10-CM | POA: Diagnosis not present

## 2017-09-19 DIAGNOSIS — M810 Age-related osteoporosis without current pathological fracture: Secondary | ICD-10-CM | POA: Diagnosis not present

## 2017-09-19 DIAGNOSIS — I359 Nonrheumatic aortic valve disorder, unspecified: Secondary | ICD-10-CM

## 2017-09-19 DIAGNOSIS — R5383 Other fatigue: Secondary | ICD-10-CM | POA: Diagnosis not present

## 2017-09-19 DIAGNOSIS — N200 Calculus of kidney: Secondary | ICD-10-CM | POA: Diagnosis not present

## 2017-09-19 LAB — POCT INR: INR: 3 (ref 2.0–3.0)

## 2017-09-19 NOTE — Patient Instructions (Signed)
Description   Continue on same dosage 1/2 tablet daily except 1 tablet Sundays, Tuesdays, and Thursdays.  Recheck in 6 weeks. Call if any new medications [phone].

## 2017-09-20 LAB — CUP PACEART REMOTE DEVICE CHECK
Battery Remaining Longevity: 46 mo
Brady Statistic AP VS Percent: 65 %
Brady Statistic AS VP Percent: 1 %
Brady Statistic RA Percent Paced: 63 %
HIGH POWER IMPEDANCE MEASURED VALUE: 57 Ohm
HighPow Impedance: 57 Ohm
Implantable Lead Implant Date: 20140403
Implantable Lead Location: 753860
Lead Channel Impedance Value: 410 Ohm
Lead Channel Pacing Threshold Amplitude: 1 V
Lead Channel Pacing Threshold Amplitude: 1 V
Lead Channel Pacing Threshold Pulse Width: 0.5 ms
Lead Channel Sensing Intrinsic Amplitude: 2.9 mV
Lead Channel Setting Pacing Pulse Width: 0.5 ms
Lead Channel Setting Sensing Sensitivity: 0.5 mV
MDC IDC LEAD IMPLANT DT: 20140403
MDC IDC LEAD LOCATION: 753859
MDC IDC MSMT BATTERY REMAINING PERCENTAGE: 47 %
MDC IDC MSMT BATTERY VOLTAGE: 2.96 V
MDC IDC MSMT LEADCHNL RV IMPEDANCE VALUE: 610 Ohm
MDC IDC MSMT LEADCHNL RV PACING THRESHOLD PULSEWIDTH: 0.5 ms
MDC IDC MSMT LEADCHNL RV SENSING INTR AMPL: 12 mV
MDC IDC PG IMPLANT DT: 20140403
MDC IDC PG SERIAL: 7080516
MDC IDC SESS DTM: 20190708080017
MDC IDC SET LEADCHNL RA PACING AMPLITUDE: 2 V
MDC IDC SET LEADCHNL RV PACING AMPLITUDE: 2.5 V
MDC IDC STAT BRADY AP VP PERCENT: 1 %
MDC IDC STAT BRADY AS VS PERCENT: 35 %
MDC IDC STAT BRADY RV PERCENT PACED: 1 %

## 2017-09-21 DIAGNOSIS — M810 Age-related osteoporosis without current pathological fracture: Secondary | ICD-10-CM | POA: Diagnosis not present

## 2017-09-21 DIAGNOSIS — E559 Vitamin D deficiency, unspecified: Secondary | ICD-10-CM | POA: Diagnosis not present

## 2017-09-23 DIAGNOSIS — E119 Type 2 diabetes mellitus without complications: Secondary | ICD-10-CM | POA: Diagnosis not present

## 2017-09-27 DIAGNOSIS — D692 Other nonthrombocytopenic purpura: Secondary | ICD-10-CM | POA: Diagnosis not present

## 2017-09-27 DIAGNOSIS — M810 Age-related osteoporosis without current pathological fracture: Secondary | ICD-10-CM | POA: Diagnosis not present

## 2017-09-27 DIAGNOSIS — I10 Essential (primary) hypertension: Secondary | ICD-10-CM | POA: Diagnosis not present

## 2017-09-27 DIAGNOSIS — E559 Vitamin D deficiency, unspecified: Secondary | ICD-10-CM | POA: Diagnosis not present

## 2017-09-27 DIAGNOSIS — Z6829 Body mass index (BMI) 29.0-29.9, adult: Secondary | ICD-10-CM | POA: Diagnosis not present

## 2017-09-27 DIAGNOSIS — E11638 Type 2 diabetes mellitus with other oral complications: Secondary | ICD-10-CM | POA: Diagnosis not present

## 2017-09-27 DIAGNOSIS — I480 Paroxysmal atrial fibrillation: Secondary | ICD-10-CM | POA: Diagnosis not present

## 2017-09-29 DIAGNOSIS — R310 Gross hematuria: Secondary | ICD-10-CM | POA: Diagnosis not present

## 2017-09-29 DIAGNOSIS — R350 Frequency of micturition: Secondary | ICD-10-CM | POA: Diagnosis not present

## 2017-10-17 ENCOUNTER — Ambulatory Visit (INDEPENDENT_AMBULATORY_CARE_PROVIDER_SITE_OTHER): Payer: Medicare Other

## 2017-10-17 DIAGNOSIS — Z95810 Presence of automatic (implantable) cardiac defibrillator: Secondary | ICD-10-CM

## 2017-10-17 DIAGNOSIS — I509 Heart failure, unspecified: Secondary | ICD-10-CM

## 2017-10-17 NOTE — Progress Notes (Signed)
EPIC Encounter for ICM Monitoring  Patient Name: [name] is a 77 y.o. female Date: 10/17/2017 Primary Care Physican: Velna Hatchet, MD Primary Cardiologist:Crenshaw Electrophysiologist: Allred Dry Weight:152lbs      Heart Failure questions reviewed, pt asymptomatic.   Thoracic impedance abnormal suggesting fluid accumulation from 10/12/2017 and almost at baseline today, 10/17/2017.  Prescribed dosage: Furosemide 20 mg by mouth daily. Can take an extra 1 tablet daily as needed for swelling.  LABS: 11/30/2016 Creatinine 1.49, BUN 28, Potassium 5.2, Sodium 142, EGFR 34-39 05/07/2016 Creatinine 1.4, BUN 33, Potassium 5.2, Sodium 138, EGFR 36.8 to 44.5  Recommendations: No changes.  Encouraged to call for fluid symptoms.  Follow-up plan: ICM clinic phone appointment on 11/17/2017.    Copy of ICM check sent to Dr. Rayann Heman.   3 month ICM trend: 10/17/2017    1 Year ICM trend:       Rosalene Billings, RN 10/17/2017 2:24 PM

## 2017-10-31 ENCOUNTER — Ambulatory Visit (INDEPENDENT_AMBULATORY_CARE_PROVIDER_SITE_OTHER): Payer: Medicare Other

## 2017-10-31 DIAGNOSIS — I359 Nonrheumatic aortic valve disorder, unspecified: Secondary | ICD-10-CM | POA: Diagnosis not present

## 2017-10-31 DIAGNOSIS — I059 Rheumatic mitral valve disease, unspecified: Secondary | ICD-10-CM | POA: Diagnosis not present

## 2017-10-31 DIAGNOSIS — I4891 Unspecified atrial fibrillation: Secondary | ICD-10-CM

## 2017-10-31 DIAGNOSIS — Z7901 Long term (current) use of anticoagulants: Secondary | ICD-10-CM

## 2017-10-31 LAB — POCT INR: INR: 2.4 (ref 2.0–3.0)

## 2017-10-31 NOTE — Patient Instructions (Signed)
Description   Continue on same dosage 1/2 tablet daily except 1 tablet Sundays, Tuesdays, and Thursdays.  Recheck in 6 weeks. Call if any new medications [phone].

## 2017-11-04 ENCOUNTER — Other Ambulatory Visit: Payer: Self-pay | Admitting: Internal Medicine

## 2017-11-04 DIAGNOSIS — I255 Ischemic cardiomyopathy: Secondary | ICD-10-CM

## 2017-11-11 ENCOUNTER — Telehealth: Payer: Self-pay

## 2017-11-11 NOTE — Telephone Encounter (Signed)
Returned call to patient as requested voice mail message.  She requested to reschedule remote transmission since she will be out of town on 11/17/2017.  Rescheduled on 11/22/2017

## 2017-11-14 ENCOUNTER — Encounter: Payer: Self-pay | Admitting: Cardiology

## 2017-11-21 NOTE — Progress Notes (Signed)
HPI: FU PAF, AVR, CM, H/O cardiac arrest s/p ICD.  History of severe AS, mitral regurgitation, aneurysm of the ascending thoracic aorta (status post aortic valve replacement with homograft, mitral valve repair, resection and grafting of the ascending thoracic aortic aneurysm on Jul 07, 2006), history of paroxysmal atrial fibrillation (status post maze procedure on Jul 07, 2006). Followup catheterization performed secondary to elevated troponin and recurrent atrial fibrillation revealed an ejection fraction of 25-30% and occlusion of the reimplanted left main. The patient subsequently underwent redo coronary artery bypass and graft with a LIMA to the LAD and a saphenous vein graft to the OM. She also had an epicardial lead placed if she required biventricular pacing in the future. Cardiac catheterization repeated in January of 2014 because of reduced LV function. The left main was occluded. The LAD, circumflex and right coronary artery were patent. The LIMA to the LAD was patent but atretic. Saphenous vein graft to the obtuse marginal was patent. This graft filled the obtuse marginal and ramus intermedius and the entire LAD filled retrograde from this graft. Ejection fraction was 35%. Patient referred to Dr. Rayann Heman for consideration of ICD; also placed on tikosyn for atrial fibrillation. Patient then admitted in March of 2014 following a ventricular fibrillation arrest. She required a prolonged resuscitation. Her QT was prolonged and her tikosyn DCed. Her hospital course was complicated by a DVT in her left arm. She ultimately had a [employer]. Jude ICD implanted. Amiodarone initiated. Echocardiogram September 2018 showed normal LV function, prior aortic valve replacement with mean gradient 7 mmHg and trace aortic insufficiency, prior mitral valve repair with mild mitral regurgitation and mild left atrial enlargement.   Patient seen by pulmonary previously and they did not feel patient had amiodarone lung  toxicity.  Since I last saw her,   Current Outpatient Medications  Medication Sig Dispense Refill  . carvedilol (COREG) 12.5 MG tablet TAKE 1 TABLET (12.5 MG TOTAL) BY MOUTH 2 (TWO) TIMES DAILY WITH A MEAL. 180 tablet 2  . Cyanocobalamin (VITAMIN B-12) 2500 MCG SUBL Place 1 tablet under the tongue every Monday, Wednesday, and Friday.     . ergocalciferol (VITAMIN D2) 50000 UNITS capsule Take 50,000 Units by mouth once a week. Monday    . furosemide (LASIX) 20 MG tablet TAKE ONE TABLET ONCE DAILY AND TAKE 1 EXTRA AS NEEDED 100 tablet 3  . losartan (COZAAR) 50 MG tablet TAKE 1 TABLET (50 MG TOTAL) BY MOUTH DAILY. 90 tablet 3  . NON FORMULARY ([employer]) Take 500 mg of elemental calcium by mouth at bedtime    . PACERONE 200 MG tablet TAKE 1/2 TABLET BY MOUTH DAILY. 45 tablet 2  . Psyllium (METAMUCIL PO) Take 1 teaspoon by mouth daily with water    . warfarin (COUMADIN) 3 MG tablet TAKE 1/2 TO 1 TABLET BY MOUTH DAILY AS DIRECTED BY COUMADIN CLINIC 30 tablet 3   No current facility-administered medications for this visit.      Past Medical History:  Diagnosis Date  . AICD (automatic cardioverter/defibrillator) present    2014  . Anemia   . Arthritis    OSTEO  . Atrial fibrillation [facility])    A.fib/flutter: s/p MAZE 2008, DCCV 2011, on coumadin; previously on flecainide, but dc'd due to worsening EF. Tikosyn discontinued 06/2012 after cardiac arrest.  . Carcinoma in situ of vulva   . Chronic systolic CHF (congestive heart failure) (HCC)    EF 35%  . Coronary artery disease  Occluded LM 2/2 previous aortic root surgery; s/p 2v CABG 2010 LIMA to LAD, SVG to OM; Cath 03/11/12 patent SVG to OM, atretic LIMA to LAD, patent RCA, occluded native LM, EF 35%   . CVA (cerebral vascular accident) ([place])    2008 - felt due to oscillating calcium on aortic valve  . Diabetes mellitus   . Diverticulitis   . Dysrhythmia   . Fibroid   . Fracture of lumbar spine ([place])   . GERD (gastroesophageal reflux  disease)   . Hypertension   . Ischemic cardiomyopathy    EF 35%  . Kidney stones   . Mitral regurgitation    s/p mitral valve repair 2008  . Osteoporosis   . Ovarian cyst, left 2008-2009  . Severe aortic stenosis    s/p homograft aortic root replacement 2008  . Stroke ([place])   . Superficial venous thrombosis of upper extremity    06/2012 - LUE  . Thoracic aortic aneurysm [facility])    s/p resection and grafting 2008  . Ventricular fibrillation ([place])    VF arrest/VT/torsades 06/2012 with prolonged hosp with VDRF, cardiogen shock, asp PNA, encephalopathy, anemia, shock liver, hypernatremia - s/p ICD implantation 06/08/12.    Past Surgical History:  Procedure Laterality Date  . aneurysm and left sided maze  2008  . AORTIC ROOT REPLACEMENT  2008   homograft  . CARDIAC DEFIBRILLATOR PLACEMENT  06/08/2012   [employer]. Jude Medical Fortify [address] DR  implanted by Dr Rayann Heman following a cardiac arrest  . CATARACT EXTRACTION    . CORONARY ARTERY BYPASS GRAFT  4/10   LIMA to LAD, SVG to OM  . EYE SURGERY    . FOOT SURGERY Left    removed neuroma  . IMPLANTABLE CARDIOVERTER DEFIBRILLATOR IMPLANT N/A 06/08/2012   Procedure: IMPLANTABLE CARDIOVERTER DEFIBRILLATOR IMPLANT;  Surgeon: Thompson Grayer, MD;  Location: [facility] CATH LAB;  Service: Cardiovascular;  Laterality: N/A;  . KYPHOPLASTY N/A 12/11/2014   Procedure: KYPHOPLASTY T-12;  Surgeon: Karie Chimera, MD;  Location: [facility] NEURO ORS;  Service: Neurosurgery;  Laterality: N/A;  KYPHOPLASTY T-12  . MITRAL VALVE REPLACEMENT  2008   due to severe MR  . Resection and grafting of ascending thoracic aortic    . RIGHT HEART CATHETERIZATION N/A 04/03/2012   Procedure: RIGHT HEART CATH;  Surgeon: [name], MD;  Location: [facility] CATH LAB;  Service: Cardiovascular;  Laterality: N/A;  . WIDE EXCISION OF VULVA     CA INSITU    Social History   Socioeconomic History  . Marital status: Married    Spouse name: Not on file  . Number of children: 0  . Years of education:  Not on file  . Highest education level: Not on file  Occupational History  . Occupation: Retired Medical laboratory scientific officer  . Financial resource strain: Not on file  . Food insecurity:    Worry: Not on file    Inability: Not on file  . Transportation needs:    Medical: Not on file    Non-medical: Not on file  Tobacco Use  . Smoking status: Former Smoker    Packs/day: 0.25    Years: 20.00    Pack years: 5.00    Types: Cigarettes    Last attempt to quit: 03/09/1983    Years since quitting: 34.7  . Smokeless tobacco: Never Used  Substance and Sexual Activity  . Alcohol use: No  . Drug use: No  . Sexual activity: Not Currently    Birth control/protection: Post-menopausal  Lifestyle  . Physical activity:    Days per week: Not on file    Minutes per session: Not on file  . Stress: Not on file  Relationships  . Social connections:    Talks on phone: Not on file    Gets together: Not on file    Attends religious service: Not on file    Active member of club or organization: Not on file    Attends meetings of clubs or organizations: Not on file    Relationship status: Not on file  . Intimate partner violence:    Fear of current or ex partner: Not on file    Emotionally abused: Not on file    Physically abused: Not on file    Forced sexual activity: Not on file  Other Topics Concern  . Not on file  Social History Narrative   Her mother died at age 77 from a stroke.  Her father died at age 93, had chronic obstructive pulmonary disease and colon disease.   Regular exercise: was in cardiac rehab   Caffeine use: none    Family History  Problem Relation Age of Onset  . Hypertension Mother   . Breast cancer Maternal Aunt        age 109's  . Colon cancer Paternal Grandmother   . Diabetes Maternal Uncle   . Diabetes Paternal Uncle   . Emphysema Father     ROS: no fevers or chills, productive cough, hemoptysis, dysphasia, odynophagia, melena, hematochezia, dysuria, hematuria,  rash, seizure activity, orthopnea, PND, pedal edema, claudication. Remaining systems are negative.  Physical Exam: Well-developed well-nourished in no acute distress.  Skin is warm and dry.  HEENT is normal.  Neck is supple.  Chest is clear to auscultation with normal expansion.  Cardiovascular exam is regular rate and rhythm.  Abdominal exam nontender or distended. No masses palpated. Extremities show no edema. neuro grossly intact  ECG- personally reviewed  A/P  1 paroxysmal atrial fibrillation-patient remains in sinus rhythm today.  We will continue with amiodarone.  Check TSH, liver functions and chest x-ray.  Continue coumadin. Check hemoglobin and renal function.  2 prior aortic valve replacement and mitral valve repair-continue SBE prophylaxis.  3 ischemic cardiomyopathy-improved on most recent echocardiogram.  Continue present dose of ARB and beta-blocker.  4 hypertension-blood pressure is controlled today.  Continue present medications.  5 hyperlipidemia-we will continue with diet.  She is not interested in statins.  6 prior ICD-followed by electrophysiology.  [name], MD

## 2017-11-22 ENCOUNTER — Ambulatory Visit (INDEPENDENT_AMBULATORY_CARE_PROVIDER_SITE_OTHER): Payer: Medicare Other

## 2017-11-22 DIAGNOSIS — I509 Heart failure, unspecified: Secondary | ICD-10-CM | POA: Diagnosis not present

## 2017-11-22 DIAGNOSIS — Z95810 Presence of automatic (implantable) cardiac defibrillator: Secondary | ICD-10-CM | POA: Diagnosis not present

## 2017-11-22 NOTE — Progress Notes (Signed)
EPIC Encounter for ICM Monitoring  Patient Name: [name] is a 77 y.o. female Date: 11/22/2017 Primary Care Physican: Velna Hatchet, MD Primary Cardiologist:Crenshaw Electrophysiologist: Allred Dry Weight:152lbs         Heart Failure questions reviewed, pt asymptomatic.   Thoracic impedance normal but was abnormal suggesting fluid accumulation from 11/04/2017 - 11/16/2017.  Prescribed: Furosemide 20 mg by mouth daily. Can take an extra 1 tablet daily as needed for swelling.  LABS: 11/30/2016 Creatinine 1.49, BUN 28, Potassium 5.2, Sodium 142, EGFR 34-39 05/07/2016 Creatinine 1.4, BUN 33, Potassium 5.2, Sodium 138, EGFR 36.8 to 44.5  Recommendations: No changes.  Encouraged to call for fluid symptoms.  Follow-up plan: ICM clinic phone appointment on 12/23/2017.   Office appointment scheduled 11/28/2017 with Dr. Stanford Breed.    Copy of ICM check sent to Dr. Rayann Heman.   3 month ICM trend: 11/22/2017    1 Year ICM trend:       Rosalene Billings, RN 11/22/2017 9:44 AM

## 2017-11-25 ENCOUNTER — Other Ambulatory Visit: Payer: Self-pay | Admitting: Cardiology

## 2017-11-28 ENCOUNTER — Ambulatory Visit (INDEPENDENT_AMBULATORY_CARE_PROVIDER_SITE_OTHER): Payer: Medicare Other | Admitting: Cardiology

## 2017-11-28 ENCOUNTER — Ambulatory Visit (HOSPITAL_COMMUNITY)
Admission: RE | Admit: 2017-11-28 | Discharge: 2017-11-28 | Disposition: A | Discharge status: Home / Self Care | Payer: Medicare Other | Source: Ambulatory Visit | Attending: Cardiology | Admitting: Cardiology

## 2017-11-28 ENCOUNTER — Encounter: Payer: Self-pay | Admitting: Cardiology

## 2017-11-28 VITALS — BP 114/72 | HR 63 | Ht 61.0 in | Wt 153.0 lb

## 2017-11-28 DIAGNOSIS — I255 Ischemic cardiomyopathy: Secondary | ICD-10-CM

## 2017-11-28 DIAGNOSIS — I059 Rheumatic mitral valve disease, unspecified: Secondary | ICD-10-CM | POA: Diagnosis not present

## 2017-11-28 DIAGNOSIS — I481 Persistent atrial fibrillation: Secondary | ICD-10-CM | POA: Diagnosis not present

## 2017-11-28 DIAGNOSIS — I10 Essential (primary) hypertension: Secondary | ICD-10-CM

## 2017-11-28 DIAGNOSIS — I4819 Other persistent atrial fibrillation: Secondary | ICD-10-CM

## 2017-11-28 DIAGNOSIS — Z952 Presence of prosthetic heart valve: Secondary | ICD-10-CM | POA: Diagnosis not present

## 2017-11-28 DIAGNOSIS — R0602 Shortness of breath: Secondary | ICD-10-CM | POA: Diagnosis not present

## 2017-11-28 DIAGNOSIS — Z951 Presence of aortocoronary bypass graft: Secondary | ICD-10-CM | POA: Diagnosis not present

## 2017-11-28 DIAGNOSIS — E7800 Pure hypercholesterolemia, unspecified: Secondary | ICD-10-CM

## 2017-11-28 MED ORDER — CARVEDILOL 12.5 MG PO TABS: 12.5000 mg | ORAL_TABLET | Freq: Two times a day (BID) | ORAL | 3 refills | Status: DC

## 2017-11-28 NOTE — Patient Instructions (Signed)
Medication Instructions:   NO CHANGE-REFILL SENT TO THE PHARMACY ELECTRONICALLY   Labwork:  Your physician recommends that you return for lab work 12/12/17  Testing/Procedures:  A chest x-ray takes a picture of the organs and structures inside the chest, including the heart, lungs, and blood vessels. This test can show several things, including, whether the heart is enlarges; whether fluid is building up in the lungs; and whether pacemaker / defibrillator leads are still in place. AT [facility]  Follow-Up:  Your physician wants you to follow-up in: [place] will receive a reminder letter in the mail two months in advance. If you don't receive a letter, please call our office to schedule the follow-up appointment.   If you need a refill on your cardiac medications before your next appointment, please call your pharmacy.

## 2017-12-12 ENCOUNTER — Ambulatory Visit (INDEPENDENT_AMBULATORY_CARE_PROVIDER_SITE_OTHER): Payer: Medicare Other | Admitting: *Deleted

## 2017-12-12 ENCOUNTER — Other Ambulatory Visit: Payer: Medicare Other | Admitting: *Deleted

## 2017-12-12 DIAGNOSIS — I059 Rheumatic mitral valve disease, unspecified: Secondary | ICD-10-CM | POA: Diagnosis not present

## 2017-12-12 DIAGNOSIS — I255 Ischemic cardiomyopathy: Secondary | ICD-10-CM

## 2017-12-12 DIAGNOSIS — Z7901 Long term (current) use of anticoagulants: Secondary | ICD-10-CM

## 2017-12-12 DIAGNOSIS — I4891 Unspecified atrial fibrillation: Secondary | ICD-10-CM

## 2017-12-12 DIAGNOSIS — I359 Nonrheumatic aortic valve disorder, unspecified: Secondary | ICD-10-CM | POA: Diagnosis not present

## 2017-12-12 DIAGNOSIS — I4819 Other persistent atrial fibrillation: Secondary | ICD-10-CM

## 2017-12-12 LAB — POCT INR: INR: 2 (ref 2.0–3.0)

## 2017-12-12 NOTE — Progress Notes (Signed)
Remote ICD transmission.   

## 2017-12-12 NOTE — Patient Instructions (Signed)
Description   Today take 1 tablet then continue on same dosage 1/2 tablet daily except 1 tablet Sundays, Tuesdays, and Thursdays.  Recheck in 6 weeks. Call if any new medications [phone].

## 2017-12-13 ENCOUNTER — Other Ambulatory Visit: Payer: Self-pay | Admitting: Internal Medicine

## 2017-12-13 LAB — COMPREHENSIVE METABOLIC PANEL
A/G RATIO: 1.9 (ref 1.2–2.2)
ALT: 6 IU/L (ref 0–32)
AST: 13 IU/L (ref 0–40)
Albumin: 4.3 g/dL (ref 3.5–4.8)
Alkaline Phosphatase: 61 IU/L (ref 39–117)
BUN/Creatinine Ratio: 16 (ref 12–28)
BUN: 26 mg/dL (ref 8–27)
Bilirubin Total: 0.4 mg/dL (ref 0.0–1.2)
CALCIUM: 9.2 mg/dL (ref 8.7–10.3)
CHLORIDE: 100 mmol/L (ref 96–106)
CO2: 21 mmol/L (ref 20–29)
Creatinine, Ser: 1.61 mg/dL — ABNORMAL HIGH (ref 0.57–1.00)
GFR calc Af Amer: 36 mL/min/{1.73_m2} — ABNORMAL LOW (ref >59)
GFR, EST NON AFRICAN AMERICAN: 31 mL/min/{1.73_m2} — AB (ref >59)
GLOBULIN, TOTAL: 2.3 g/dL (ref 1.5–4.5)
Glucose: 151 mg/dL — ABNORMAL HIGH (ref 65–99)
POTASSIUM: 5.1 mmol/L (ref 3.5–5.2)
SODIUM: 142 mmol/L (ref 134–144)
Total Protein: 6.6 g/dL (ref 6.0–8.5)

## 2017-12-13 LAB — CBC
HEMATOCRIT: 38.2 % (ref 34.0–46.6)
Hemoglobin: 12.8 g/dL (ref 11.1–15.9)
MCH: 30.3 pg (ref 26.6–33.0)
MCHC: 33.5 g/dL (ref 31.5–35.7)
MCV: 90 fL (ref 79–97)
PLATELETS: 302 10*3/uL (ref 150–450)
RBC: 4.23 x10E6/uL (ref 3.77–5.28)
RDW: 12.8 % (ref 12.3–15.4)
WBC: 8.6 10*3/uL (ref 3.4–10.8)

## 2017-12-13 LAB — TSH: TSH: 3.93 u[IU]/mL (ref 0.450–4.500)

## 2017-12-15 ENCOUNTER — Telehealth: Payer: Self-pay

## 2017-12-15 NOTE — Telephone Encounter (Signed)
Returned patient call. She requested to change remote transmission date due to she will be out of town 12/23/2017.  Rescheduled for 12/27/2017.

## 2017-12-27 ENCOUNTER — Ambulatory Visit (INDEPENDENT_AMBULATORY_CARE_PROVIDER_SITE_OTHER): Payer: Medicare Other

## 2017-12-27 DIAGNOSIS — I509 Heart failure, unspecified: Secondary | ICD-10-CM

## 2017-12-27 DIAGNOSIS — Z95810 Presence of automatic (implantable) cardiac defibrillator: Secondary | ICD-10-CM

## 2017-12-27 NOTE — Progress Notes (Signed)
EPIC Encounter for ICM Monitoring  Patient Name: TUESDAY TERLECKI is a 77 y.o. female Date: 12/27/2017 Primary Care Physican: Velna Hatchet, MD Primary Cardiologist:Crenshaw Electrophysiologist: Allred Dry Weight:152lbs      Heart Failure questions reviewed, pt asymptomatic.   Thoracic impedance abnormal suggesting fluid accumulation starting 12/19/2017.   Prescribed: Furosemide 20 mg by mouth daily. Can take an extra 1 tablet daily as needed for swelling.  LABS: 11/30/2016 Creatinine 1.49, BUN 28, Potassium 5.2, Sodium 142, EGFR 34-39 05/07/2016 Creatinine 1.4, BUN 33, Potassium 5.2, Sodium 138, EGFR 36.8 to 44.5  Recommendations: Advised to take extra Furosemide x 3 days and then return to Furosemide 20 mg daily.  Follow-up plan: ICM clinic phone appointment on 01/09/2018 to recheck fluid levels.     Copy of ICM check sent to Dr. Rayann Heman and Dr Stanford Breed.   3 month ICM trend: 12/27/2017    1 Year ICM trend:       Rosalene Billings, RN 12/27/2017 10:42 AM

## 2018-01-06 DIAGNOSIS — Z23 Encounter for immunization: Secondary | ICD-10-CM | POA: Diagnosis not present

## 2018-01-09 ENCOUNTER — Ambulatory Visit (INDEPENDENT_AMBULATORY_CARE_PROVIDER_SITE_OTHER): Payer: Medicare Other

## 2018-01-09 DIAGNOSIS — I509 Heart failure, unspecified: Secondary | ICD-10-CM

## 2018-01-09 DIAGNOSIS — Z95810 Presence of automatic (implantable) cardiac defibrillator: Secondary | ICD-10-CM

## 2018-01-09 NOTE — Progress Notes (Signed)
EPIC Encounter for ICM Monitoring  Patient Name: [name] is a 77 y.o. female Date: 01/09/2018 Primary Care Physican: Velna Hatchet, MD Primary Cardiologist:Crenshaw Electrophysiologist: Allred Dry Weight:152lbs           Heart Failure questions reviewed, pt asymptomatic.   Thoracic impedance normal.   Prescribed: Furosemide 20 mg by mouth daily. Can take an extra 1 tablet daily as needed for swelling.  LABS: 11/30/2016 Creatinine 1.49, BUN 28, Potassium 5.2, Sodium 142, EGFR 34-39 05/07/2016 Creatinine 1.4, BUN 33, Potassium 5.2, Sodium 138, EGFR 36.8 to 44.5  Recommendations: No changes.  Reinforced limiting salt intake.  Encouraged to call for fluid symptoms.  Follow-up plan: ICM clinic phone appointment on 02/09/2018.    Copy of ICM check sent to Dr. Rayann Heman.   3 month ICM trend: 01/09/2018    1 Year ICM trend:       Rosalene Billings, RN 01/09/2018 2:28 PM

## 2018-01-16 ENCOUNTER — Ambulatory Visit (INDEPENDENT_AMBULATORY_CARE_PROVIDER_SITE_OTHER): Payer: Medicare Other | Admitting: *Deleted

## 2018-01-16 DIAGNOSIS — I4891 Unspecified atrial fibrillation: Secondary | ICD-10-CM | POA: Diagnosis not present

## 2018-01-16 DIAGNOSIS — I359 Nonrheumatic aortic valve disorder, unspecified: Secondary | ICD-10-CM | POA: Diagnosis not present

## 2018-01-16 DIAGNOSIS — Z7901 Long term (current) use of anticoagulants: Secondary | ICD-10-CM | POA: Diagnosis not present

## 2018-01-16 DIAGNOSIS — I059 Rheumatic mitral valve disease, unspecified: Secondary | ICD-10-CM

## 2018-01-16 LAB — POCT INR: INR: 3.7 — AB (ref 2.0–3.0)

## 2018-01-16 NOTE — Patient Instructions (Signed)
Description   Do not today's dose then continue on same dosage 1/2 tablet daily except 1 tablet Sundays, Tuesdays, and Thursdays.  Recheck in 4 weeks. Call if any new medications [phone].

## 2018-01-23 DIAGNOSIS — Z6829 Body mass index (BMI) 29.0-29.9, adult: Secondary | ICD-10-CM | POA: Diagnosis not present

## 2018-01-23 DIAGNOSIS — I10 Essential (primary) hypertension: Secondary | ICD-10-CM | POA: Diagnosis not present

## 2018-01-23 DIAGNOSIS — E1121 Type 2 diabetes mellitus with diabetic nephropathy: Secondary | ICD-10-CM | POA: Diagnosis not present

## 2018-01-23 DIAGNOSIS — E1169 Type 2 diabetes mellitus with other specified complication: Secondary | ICD-10-CM | POA: Diagnosis not present

## 2018-01-23 DIAGNOSIS — I5022 Chronic systolic (congestive) heart failure: Secondary | ICD-10-CM | POA: Diagnosis not present

## 2018-01-23 DIAGNOSIS — I130 Hypertensive heart and chronic kidney disease with heart failure and stage 1 through stage 4 chronic kidney disease, or unspecified chronic kidney disease: Secondary | ICD-10-CM | POA: Diagnosis not present

## 2018-01-23 DIAGNOSIS — N183 Chronic kidney disease, stage 3 (moderate): Secondary | ICD-10-CM | POA: Diagnosis not present

## 2018-01-27 LAB — CUP PACEART REMOTE DEVICE CHECK
Battery Remaining Longevity: 43 mo
Battery Voltage: 2.95 V
Brady Statistic AP VP Percent: 1 %
Brady Statistic AS VP Percent: 1 %
Brady Statistic AS VS Percent: 36 %
Brady Statistic RA Percent Paced: 63 %
Date Time Interrogation Session: 20191007080017
HighPow Impedance: 60 Ohm
HighPow Impedance: 60 Ohm
Implantable Lead Implant Date: 20140403
Implantable Lead Location: 753859
Implantable Lead Location: 753860
Lead Channel Impedance Value: 430 Ohm
Lead Channel Pacing Threshold Amplitude: 1 V
Lead Channel Pacing Threshold Pulse Width: 0.5 ms
Lead Channel Sensing Intrinsic Amplitude: 12 mV
Lead Channel Setting Pacing Amplitude: 2 V
Lead Channel Setting Pacing Pulse Width: 0.5 ms
Lead Channel Setting Sensing Sensitivity: 0.5 mV
MDC IDC LEAD IMPLANT DT: 20140403
MDC IDC MSMT BATTERY REMAINING PERCENTAGE: 44 %
MDC IDC MSMT LEADCHNL RA PACING THRESHOLD AMPLITUDE: 1 V
MDC IDC MSMT LEADCHNL RA SENSING INTR AMPL: 3.1 mV
MDC IDC MSMT LEADCHNL RV IMPEDANCE VALUE: 680 Ohm
MDC IDC MSMT LEADCHNL RV PACING THRESHOLD PULSEWIDTH: 0.5 ms
MDC IDC PG IMPLANT DT: 20140403
MDC IDC SET LEADCHNL RV PACING AMPLITUDE: 2.5 V
MDC IDC STAT BRADY AP VS PERCENT: 64 %
MDC IDC STAT BRADY RV PERCENT PACED: 1 %
Pulse Gen Serial Number: 7080516

## 2018-02-05 ENCOUNTER — Other Ambulatory Visit: Payer: Self-pay | Admitting: Cardiology

## 2018-02-07 ENCOUNTER — Telehealth: Payer: Self-pay

## 2018-02-07 DIAGNOSIS — E11638 Type 2 diabetes mellitus with other oral complications: Secondary | ICD-10-CM | POA: Diagnosis not present

## 2018-02-07 NOTE — Telephone Encounter (Signed)
Returned patient call.  She will be out of town on 02/09/2018 the date of next ICM remote transmission.  Advised will reschedule for 02/23/2018 and to call if she has any problems before that time.

## 2018-02-13 ENCOUNTER — Ambulatory Visit (INDEPENDENT_AMBULATORY_CARE_PROVIDER_SITE_OTHER): Payer: Medicare Other | Admitting: *Deleted

## 2018-02-13 ENCOUNTER — Ambulatory Visit (INDEPENDENT_AMBULATORY_CARE_PROVIDER_SITE_OTHER): Payer: Medicare Other

## 2018-02-13 DIAGNOSIS — I359 Nonrheumatic aortic valve disorder, unspecified: Secondary | ICD-10-CM | POA: Diagnosis not present

## 2018-02-13 DIAGNOSIS — Z7901 Long term (current) use of anticoagulants: Secondary | ICD-10-CM

## 2018-02-13 DIAGNOSIS — I509 Heart failure, unspecified: Secondary | ICD-10-CM | POA: Diagnosis not present

## 2018-02-13 DIAGNOSIS — I059 Rheumatic mitral valve disease, unspecified: Secondary | ICD-10-CM

## 2018-02-13 DIAGNOSIS — Z95810 Presence of automatic (implantable) cardiac defibrillator: Secondary | ICD-10-CM | POA: Diagnosis not present

## 2018-02-13 DIAGNOSIS — I4891 Unspecified atrial fibrillation: Secondary | ICD-10-CM | POA: Diagnosis not present

## 2018-02-13 LAB — POCT INR: INR: 3.9 — AB (ref 2.0–3.0)

## 2018-02-13 NOTE — Progress Notes (Signed)
EPIC Encounter for ICM Monitoring  Patient Name: [name] is a 77 y.o. female Date: 02/13/2018 Primary Care Physican: Velna Hatchet, MD Primary Cardiologist:Crenshaw Electrophysiologist: Allred Last Weight:152lbs  Today's Weight: 151 lbs        Heart Failure questions reviewed, pt asymptomatic.   Thoracic impedance normal.   Prescribed: Furosemide 20 mg by mouth daily. Can take an extra 1 tablet daily as needed for swelling.  LABS: 12/12/2017 Creatinine 1.61, BUN 26, Potassium 5.1, Sodium 142, eGFR 31-36  Recommendations: No changes.    Encouraged to call for fluid symptoms.  Follow-up plan: ICM clinic phone appointment on 03/24/2018.    Copy of ICM check sent to Dr. Rayann Heman.   3 month ICM trend: 02/11/2018    1 Year ICM trend:       Rosalene Billings, RN 02/13/2018 11:03 AM

## 2018-02-13 NOTE — Patient Instructions (Signed)
Description   Skip today's dose then change your dose to 1/2 tablet daily except 1 tablet Sundays and Thursdays.  Recheck in 2 weeks. Starting drink your vitamin K drink once a week.  Call if any new medications [phone].

## 2018-02-27 ENCOUNTER — Ambulatory Visit (INDEPENDENT_AMBULATORY_CARE_PROVIDER_SITE_OTHER): Payer: Medicare Other | Admitting: Pharmacist

## 2018-02-27 DIAGNOSIS — I4891 Unspecified atrial fibrillation: Secondary | ICD-10-CM | POA: Diagnosis not present

## 2018-02-27 DIAGNOSIS — Z7901 Long term (current) use of anticoagulants: Secondary | ICD-10-CM

## 2018-02-27 DIAGNOSIS — I059 Rheumatic mitral valve disease, unspecified: Secondary | ICD-10-CM

## 2018-02-27 DIAGNOSIS — I359 Nonrheumatic aortic valve disorder, unspecified: Secondary | ICD-10-CM | POA: Diagnosis not present

## 2018-02-27 LAB — POCT INR: INR: 3.6 — AB (ref 2.0–3.0)

## 2018-02-27 NOTE — Patient Instructions (Signed)
Description   Skip today's dose then change your dose to 1/2 tablet daily except 1 tablet Sundays and Thursdays.  INCREASE protein shake to 2 per week. Recheck in 2 weeks.  Call if any new medications [phone].

## 2018-03-13 ENCOUNTER — Ambulatory Visit (INDEPENDENT_AMBULATORY_CARE_PROVIDER_SITE_OTHER): Payer: Medicare Other | Admitting: *Deleted

## 2018-03-13 DIAGNOSIS — I059 Rheumatic mitral valve disease, unspecified: Secondary | ICD-10-CM | POA: Diagnosis not present

## 2018-03-13 DIAGNOSIS — I359 Nonrheumatic aortic valve disorder, unspecified: Secondary | ICD-10-CM

## 2018-03-13 DIAGNOSIS — I4891 Unspecified atrial fibrillation: Secondary | ICD-10-CM | POA: Diagnosis not present

## 2018-03-13 DIAGNOSIS — Z7901 Long term (current) use of anticoagulants: Secondary | ICD-10-CM | POA: Diagnosis not present

## 2018-03-13 LAB — POCT INR: INR: 2.8 (ref 2.0–3.0)

## 2018-03-13 NOTE — Patient Instructions (Signed)
Description   Continue taking 1/2 tablet daily except 1 tablet Sundays and Thursdays.  INCREASE protein shake to 2 per week. Recheck in 2 weeks.  Call if any new medications [phone].

## 2018-03-17 DIAGNOSIS — I129 Hypertensive chronic kidney disease with stage 1 through stage 4 chronic kidney disease, or unspecified chronic kidney disease: Secondary | ICD-10-CM | POA: Diagnosis not present

## 2018-03-17 DIAGNOSIS — N183 Chronic kidney disease, stage 3 (moderate): Secondary | ICD-10-CM | POA: Diagnosis not present

## 2018-03-17 DIAGNOSIS — D631 Anemia in chronic kidney disease: Secondary | ICD-10-CM | POA: Diagnosis not present

## 2018-03-17 DIAGNOSIS — N189 Chronic kidney disease, unspecified: Secondary | ICD-10-CM | POA: Diagnosis not present

## 2018-03-17 DIAGNOSIS — N184 Chronic kidney disease, stage 4 (severe): Secondary | ICD-10-CM | POA: Diagnosis not present

## 2018-03-17 DIAGNOSIS — N200 Calculus of kidney: Secondary | ICD-10-CM | POA: Diagnosis not present

## 2018-03-21 DIAGNOSIS — R310 Gross hematuria: Secondary | ICD-10-CM | POA: Diagnosis not present

## 2018-03-21 DIAGNOSIS — N184 Chronic kidney disease, stage 4 (severe): Secondary | ICD-10-CM | POA: Diagnosis not present

## 2018-03-21 DIAGNOSIS — N200 Calculus of kidney: Secondary | ICD-10-CM | POA: Diagnosis not present

## 2018-03-24 ENCOUNTER — Ambulatory Visit (INDEPENDENT_AMBULATORY_CARE_PROVIDER_SITE_OTHER): Payer: Medicare Other

## 2018-03-24 DIAGNOSIS — R5383 Other fatigue: Secondary | ICD-10-CM | POA: Diagnosis not present

## 2018-03-24 DIAGNOSIS — I509 Heart failure, unspecified: Secondary | ICD-10-CM

## 2018-03-24 DIAGNOSIS — I255 Ischemic cardiomyopathy: Secondary | ICD-10-CM | POA: Diagnosis not present

## 2018-03-24 DIAGNOSIS — E559 Vitamin D deficiency, unspecified: Secondary | ICD-10-CM | POA: Diagnosis not present

## 2018-03-24 DIAGNOSIS — M810 Age-related osteoporosis without current pathological fracture: Secondary | ICD-10-CM | POA: Diagnosis not present

## 2018-03-25 LAB — CUP PACEART REMOTE DEVICE CHECK
Brady Statistic AP VP Percent: 1 %
Brady Statistic AP VS Percent: 65 %
Brady Statistic AS VS Percent: 35 %
Brady Statistic RA Percent Paced: 63 %
Brady Statistic RV Percent Paced: 1 %
HIGH POWER IMPEDANCE MEASURED VALUE: 64 Ohm
HighPow Impedance: 64 Ohm
Implantable Lead Implant Date: 20140403
Implantable Lead Location: 753859
Implantable Pulse Generator Implant Date: 20140403
Lead Channel Impedance Value: 430 Ohm
Lead Channel Pacing Threshold Amplitude: 1 V
Lead Channel Pacing Threshold Amplitude: 1 V
Lead Channel Pacing Threshold Pulse Width: 0.5 ms
Lead Channel Sensing Intrinsic Amplitude: 2.2 mV
Lead Channel Setting Pacing Amplitude: 2.5 V
Lead Channel Setting Pacing Pulse Width: 0.5 ms
Lead Channel Setting Sensing Sensitivity: 0.5 mV
MDC IDC LEAD IMPLANT DT: 20140403
MDC IDC LEAD LOCATION: 753860
MDC IDC MSMT BATTERY REMAINING LONGEVITY: 40 mo
MDC IDC MSMT BATTERY REMAINING PERCENTAGE: 41 %
MDC IDC MSMT BATTERY VOLTAGE: 2.95 V
MDC IDC MSMT LEADCHNL RA PACING THRESHOLD PULSEWIDTH: 0.5 ms
MDC IDC MSMT LEADCHNL RV IMPEDANCE VALUE: 680 Ohm
MDC IDC MSMT LEADCHNL RV SENSING INTR AMPL: 12 mV
MDC IDC PG SERIAL: 7080516
MDC IDC SESS DTM: 20200117070017
MDC IDC SET LEADCHNL RA PACING AMPLITUDE: 2 V
MDC IDC STAT BRADY AS VP PERCENT: 1 %

## 2018-03-27 ENCOUNTER — Ambulatory Visit (INDEPENDENT_AMBULATORY_CARE_PROVIDER_SITE_OTHER): Payer: Medicare Other

## 2018-03-27 DIAGNOSIS — I509 Heart failure, unspecified: Secondary | ICD-10-CM | POA: Diagnosis not present

## 2018-03-27 DIAGNOSIS — Z95810 Presence of automatic (implantable) cardiac defibrillator: Secondary | ICD-10-CM | POA: Diagnosis not present

## 2018-03-27 NOTE — Progress Notes (Signed)
EPIC Encounter for ICM Monitoring  Patient Name: [name] is a 78 y.o. female Date: 03/27/2018 Primary Care Physican: Velna Hatchet, MD Primary Cardiologist:Crenshaw Electrophysiologist: Allred Last Weight:152lbs   Today's Weight: 151 lbs                                                    Heart Failure questions reviewed, pt asymptomatic.   Thoracic impedance normal.   Prescribed: Furosemide 20 mg by mouth daily. Can take an extra 1 tablet daily as needed for swelling.  LABS: 12/12/2017 Creatinine 1.61, BUN 26, Potassium 5.1, Sodium 142, eGFR 31-36  Recommendations: No changes.    Encouraged to call for fluid symptoms.  Follow-up plan: ICM clinic phone appointment on 05/01/2018.    Copy of ICM check sent to Dr. Rayann Heman.   3 month ICM trend: 03/27/2018    1 Year ICM trend:       Rosalene Billings, RN 03/27/2018 4:36 PM

## 2018-03-27 NOTE — Progress Notes (Signed)
Remote ICD transmission.   

## 2018-03-28 ENCOUNTER — Telehealth: Payer: Self-pay | Admitting: *Deleted

## 2018-03-28 ENCOUNTER — Ambulatory Visit (INDEPENDENT_AMBULATORY_CARE_PROVIDER_SITE_OTHER): Payer: Medicare Other

## 2018-03-28 DIAGNOSIS — Z7901 Long term (current) use of anticoagulants: Secondary | ICD-10-CM

## 2018-03-28 DIAGNOSIS — I359 Nonrheumatic aortic valve disorder, unspecified: Secondary | ICD-10-CM

## 2018-03-28 DIAGNOSIS — I059 Rheumatic mitral valve disease, unspecified: Secondary | ICD-10-CM | POA: Diagnosis not present

## 2018-03-28 DIAGNOSIS — I4891 Unspecified atrial fibrillation: Secondary | ICD-10-CM | POA: Diagnosis not present

## 2018-03-28 DIAGNOSIS — E559 Vitamin D deficiency, unspecified: Secondary | ICD-10-CM | POA: Diagnosis not present

## 2018-03-28 DIAGNOSIS — M810 Age-related osteoporosis without current pathological fracture: Secondary | ICD-10-CM | POA: Diagnosis not present

## 2018-03-28 LAB — POCT INR: INR: 2.7 (ref 2.0–3.0)

## 2018-03-28 NOTE — Patient Instructions (Signed)
Continue taking 1/2 tablet daily except 1 tablet Sundays and Thursdays.  Continue protein shake 2 per week. Recheck in 4 weeks.  Call if any new medications [phone].

## 2018-03-28 NOTE — Telephone Encounter (Signed)
Spoke with pt husband, Aware of dr Jacalyn Lefevre recommendations.

## 2018-03-28 NOTE — Telephone Encounter (Signed)
Okay for increase Cozaar [name]

## 2018-03-28 NOTE — Telephone Encounter (Signed)
Patients huband called the patient was seen by [facility] and now has stage IV kidney disease. The MD at [facility] wants to increase the losartan to 100 mg once daily. They want to make sure okay with dr Stanford Breed

## 2018-03-31 DIAGNOSIS — K5730 Diverticulosis of large intestine without perforation or abscess without bleeding: Secondary | ICD-10-CM | POA: Diagnosis not present

## 2018-03-31 DIAGNOSIS — N200 Calculus of kidney: Secondary | ICD-10-CM | POA: Diagnosis not present

## 2018-04-20 DIAGNOSIS — I129 Hypertensive chronic kidney disease with stage 1 through stage 4 chronic kidney disease, or unspecified chronic kidney disease: Secondary | ICD-10-CM | POA: Diagnosis not present

## 2018-04-24 ENCOUNTER — Ambulatory Visit (INDEPENDENT_AMBULATORY_CARE_PROVIDER_SITE_OTHER): Payer: Medicare Other

## 2018-04-24 DIAGNOSIS — I4891 Unspecified atrial fibrillation: Secondary | ICD-10-CM

## 2018-04-24 DIAGNOSIS — Z7901 Long term (current) use of anticoagulants: Secondary | ICD-10-CM

## 2018-04-24 DIAGNOSIS — I359 Nonrheumatic aortic valve disorder, unspecified: Secondary | ICD-10-CM | POA: Diagnosis not present

## 2018-04-24 DIAGNOSIS — I059 Rheumatic mitral valve disease, unspecified: Secondary | ICD-10-CM | POA: Diagnosis not present

## 2018-04-24 LAB — POCT INR: INR: 2.5 (ref 2.0–3.0)

## 2018-04-24 NOTE — Patient Instructions (Signed)
Description   Continue on same dosage 1/2 tablet daily except 1 tablet Sundays and Thursdays.  Continue protein shake weekly. Recheck in 4 weeks.  Call if any new medications [phone].

## 2018-05-01 ENCOUNTER — Ambulatory Visit (INDEPENDENT_AMBULATORY_CARE_PROVIDER_SITE_OTHER): Payer: Medicare Other

## 2018-05-01 DIAGNOSIS — Z95810 Presence of automatic (implantable) cardiac defibrillator: Secondary | ICD-10-CM

## 2018-05-01 DIAGNOSIS — I509 Heart failure, unspecified: Secondary | ICD-10-CM | POA: Diagnosis not present

## 2018-05-01 NOTE — Progress Notes (Signed)
EPIC Encounter for ICM Monitoring  Patient Name: [name] is a 78 y.o. female Date: 05/01/2018 Primary Care Physican: Velna Hatchet, MD Primary Cardiologist:Crenshaw Electrophysiologist: Allred LastWeight:152lbs Today's Weight:152lbs    Heart Failure questions reviewed, pt asymptomatic.  Thoracic impedance normal but was abnormal suggesting fluid accumulation 04/21/2018 until 04/28/2018.   Prescribed:Furosemide 20 mg by mouth daily. Can take an extra 1 tablet daily as needed.  LABS: 12/12/2017 Creatinine 1.61, BUN 26, Potassium 5.1, Sodium 142, eGFR 31-36  Recommendations:No changes. Encouraged to call for fluid symptoms.  Follow-up plan: ICM clinic phone appointment on3/30/2020.   Copy of ICM check sent to Dr.Allred.   3 month ICM trend: 05/01/2018    1 Year ICM trend:       Rosalene Billings, RN 05/01/2018 3:07 PM

## 2018-05-02 ENCOUNTER — Other Ambulatory Visit: Payer: Self-pay | Admitting: Cardiology

## 2018-05-22 ENCOUNTER — Ambulatory Visit (INDEPENDENT_AMBULATORY_CARE_PROVIDER_SITE_OTHER): Payer: Medicare Other | Admitting: Pharmacist

## 2018-05-22 ENCOUNTER — Other Ambulatory Visit: Payer: Self-pay

## 2018-05-22 DIAGNOSIS — I359 Nonrheumatic aortic valve disorder, unspecified: Secondary | ICD-10-CM

## 2018-05-22 DIAGNOSIS — I059 Rheumatic mitral valve disease, unspecified: Secondary | ICD-10-CM | POA: Diagnosis not present

## 2018-05-22 DIAGNOSIS — I4891 Unspecified atrial fibrillation: Secondary | ICD-10-CM | POA: Diagnosis not present

## 2018-05-22 DIAGNOSIS — Z7901 Long term (current) use of anticoagulants: Secondary | ICD-10-CM | POA: Diagnosis not present

## 2018-05-22 LAB — POCT INR: INR: 2.5 (ref 2.0–3.0)

## 2018-05-22 NOTE — Patient Instructions (Signed)
Description   Continue on same dosage 1/2 tablet daily except 1 tablet Sundays and Thursdays.  Continue protein shake weekly. Recheck in 6 weeks.  Call if any new medications [phone].

## 2018-06-05 ENCOUNTER — Ambulatory Visit (INDEPENDENT_AMBULATORY_CARE_PROVIDER_SITE_OTHER): Payer: Medicare Other

## 2018-06-05 ENCOUNTER — Other Ambulatory Visit: Payer: Self-pay

## 2018-06-05 DIAGNOSIS — I509 Heart failure, unspecified: Secondary | ICD-10-CM

## 2018-06-05 DIAGNOSIS — Z95810 Presence of automatic (implantable) cardiac defibrillator: Secondary | ICD-10-CM | POA: Diagnosis not present

## 2018-06-05 DIAGNOSIS — K5792 Diverticulitis of intestine, part unspecified, without perforation or abscess without bleeding: Secondary | ICD-10-CM | POA: Diagnosis not present

## 2018-06-05 NOTE — Progress Notes (Signed)
EPIC Encounter for ICM Monitoring  Patient Name: BRAELYNNE GARINGER is a 78 y.o. female Date: 06/05/2018 Primary Care Physican: Velna Hatchet, MD Primary Cardiologist:Crenshaw Electrophysiologist: Allred LastWeight:152lbs 06/05/2018 Weight:148lbs    Heart Failure questions reviewed, pt asymptomatic.  Thoracic impedance normal.   Prescribed:Furosemide 20 mg by mouth daily. Can take an extra 1 tablet daily as needed.  LABS: 12/12/2017 Creatinine 1.61, BUN 26, Potassium 5.1, Sodium 142, eGFR 31-36  Recommendations:No changes. Encouraged to call for fluid symptoms.  Follow-up plan: ICM clinic phone appointment on5/06/2018.   Copy of ICM check sent to Dr.Allred.   3 month ICM trend: 06/05/2018    1 Year ICM trend:       Rosalene Billings, RN 06/05/2018 4:26 PM

## 2018-06-09 DIAGNOSIS — N183 Chronic kidney disease, stage 3 (moderate): Secondary | ICD-10-CM | POA: Diagnosis not present

## 2018-06-09 DIAGNOSIS — I480 Paroxysmal atrial fibrillation: Secondary | ICD-10-CM | POA: Diagnosis not present

## 2018-06-09 DIAGNOSIS — Z7901 Long term (current) use of anticoagulants: Secondary | ICD-10-CM | POA: Diagnosis not present

## 2018-06-12 DIAGNOSIS — Z7901 Long term (current) use of anticoagulants: Secondary | ICD-10-CM | POA: Diagnosis not present

## 2018-06-12 DIAGNOSIS — I480 Paroxysmal atrial fibrillation: Secondary | ICD-10-CM | POA: Diagnosis not present

## 2018-06-12 DIAGNOSIS — N183 Chronic kidney disease, stage 3 (moderate): Secondary | ICD-10-CM | POA: Diagnosis not present

## 2018-06-14 ENCOUNTER — Telehealth (INDEPENDENT_AMBULATORY_CARE_PROVIDER_SITE_OTHER): Payer: Medicare Other | Admitting: Internal Medicine

## 2018-06-14 ENCOUNTER — Telehealth: Payer: Self-pay

## 2018-06-14 VITALS — BP 99/57 | HR 69 | Wt 148.0 lb

## 2018-06-14 DIAGNOSIS — I38 Endocarditis, valve unspecified: Secondary | ICD-10-CM | POA: Diagnosis not present

## 2018-06-14 DIAGNOSIS — I4819 Other persistent atrial fibrillation: Secondary | ICD-10-CM | POA: Diagnosis not present

## 2018-06-14 DIAGNOSIS — I255 Ischemic cardiomyopathy: Secondary | ICD-10-CM | POA: Diagnosis not present

## 2018-06-14 NOTE — Telephone Encounter (Signed)
Spoke with pt regarding appt on 06/14/18. Pt stated she will check vitals prior to appt and is unable to upload EKG. Pt questions and concerns were address.

## 2018-06-14 NOTE — Progress Notes (Signed)
Electrophysiology TeleHealth Note   Due to national recommendations of social distancing due to [place] 19, an audio telehealth visit is felt to be most appropriate for this patient at this time.  See MyChart message from today for the patient's consent to telehealth for [facility]. She does not have a smart phone.  She has only a computer, without audio/visual capability.  I therefore performed a phone visit.   Date:  06/14/2018   ID:  [name], DOB 1940-11-27, MRN [id number]  Location: patient's home  Provider location: [address], [place]  Evaluation Performed: Follow-up visit  PCP:  Velna Hatchet, MD  Cardiologist:  Dr Stanford Breed Electrophysiologist:  Dr Rayann Heman  Chief Complaint:  afib  History of Present Illness:    [name] is a 78 y.o. female who presents via audio conferencing for a telehealth visit today.  Since last being seen in our clinic, the patient reports doing well.  She has chronic and stable SOB with moderate activity.  Today, she denies symptoms of palpitations, chest pain,  lower extremity edema, dizziness, presyncope, or syncope.  The patient is otherwise without complaint today.  The patient denies symptoms of fevers, chills, cough, or new SOB worrisome for COVID 19.  Past Medical History:  Diagnosis Date  . AICD (automatic cardioverter/defibrillator) present    2014  . Anemia   . Arthritis    OSTEO  . Atrial fibrillation [facility])    A.fib/flutter: s/p MAZE 2008, DCCV 2011, on coumadin; previously on flecainide, but dc'd due to worsening EF. Tikosyn discontinued 06/2012 after cardiac arrest.  . Carcinoma in situ of vulva   . Chronic systolic CHF (congestive heart failure) (HCC)    EF 35%  . Coronary artery disease    Occluded LM 2/2 previous aortic root surgery; s/p 2v CABG 2010 LIMA to LAD, SVG to OM; Cath 03/11/12 patent SVG to OM, atretic LIMA to LAD, patent RCA, occluded native LM, EF 35%   . CVA (cerebral vascular accident) ([place])     2008 - felt due to oscillating calcium on aortic valve  . Diabetes mellitus   . Diverticulitis   . Dysrhythmia   . Fibroid   . Fracture of lumbar spine ([place])   . GERD (gastroesophageal reflux disease)   . Hypertension   . Ischemic cardiomyopathy    EF 35%  . Kidney stones   . Mitral regurgitation    s/p mitral valve repair 2008  . Osteoporosis   . Ovarian cyst, left 2008-2009  . Severe aortic stenosis    s/p homograft aortic root replacement 2008  . Stroke ([place])   . Superficial venous thrombosis of upper extremity    06/2012 - LUE  . Thoracic aortic aneurysm [facility])    s/p resection and grafting 2008  . Ventricular fibrillation ([place])    VF arrest/VT/torsades 06/2012 with prolonged hosp with VDRF, cardiogen shock, asp PNA, encephalopathy, anemia, shock liver, hypernatremia - s/p ICD implantation 06/08/12.    Past Surgical History:  Procedure Laterality Date  . aneurysm and left sided maze  2008  . AORTIC ROOT REPLACEMENT  2008   homograft  . CARDIAC DEFIBRILLATOR PLACEMENT  06/08/2012   [employer] Fortify [address] DR  implanted by Dr Rayann Heman following a cardiac arrest  . CATARACT EXTRACTION    . CORONARY ARTERY BYPASS GRAFT  4/10   LIMA to LAD, SVG to OM  . EYE SURGERY    . FOOT SURGERY Left  removed neuroma  . IMPLANTABLE CARDIOVERTER DEFIBRILLATOR IMPLANT N/A 06/08/2012   Procedure: IMPLANTABLE CARDIOVERTER DEFIBRILLATOR IMPLANT;  Surgeon: Thompson Grayer, MD;  Location: [facility] CATH LAB;  Service: Cardiovascular;  Laterality: N/A;  . KYPHOPLASTY N/A 12/11/2014   Procedure: KYPHOPLASTY T-12;  Surgeon: Karie Chimera, MD;  Location: [place] NEURO ORS;  Service: Neurosurgery;  Laterality: N/A;  KYPHOPLASTY T-12  . MITRAL VALVE REPLACEMENT  2008   due to severe MR  . Resection and grafting of ascending thoracic aortic    . RIGHT HEART CATHETERIZATION N/A 04/03/2012   Procedure: RIGHT HEART CATH;  Surgeon: [name], MD;  Location: [facility] CATH LAB;  Service: Cardiovascular;  Laterality:  N/A;  . WIDE EXCISION OF VULVA     CA INSITU    Current Outpatient Medications  Medication Sig Dispense Refill  . carvedilol (COREG) 12.5 MG tablet Take 1 tablet (12.5 mg total) by mouth 2 (two) times daily with a meal. 180 tablet 3  . Cyanocobalamin (VITAMIN B-12) 2500 MCG SUBL Place 1 tablet under the tongue every Monday, Wednesday, and Friday.     . ergocalciferol (VITAMIN D2) 50000 UNITS capsule Take 50,000 Units by mouth once a week. Monday    . furosemide (LASIX) 20 MG tablet TAKE ONE TABLET ONCE DAILY AND TAKE 1 EXTRA AS NEEDED 100 tablet 3  . losartan (COZAAR) 100 MG tablet Take 100 mg by mouth daily.    Marland Kitchen losartan (COZAAR) 50 MG tablet TAKE 1 TABLET (50 MG TOTAL) BY MOUTH DAILY. 90 tablet 0  . NON FORMULARY ([employer] [facility]) Take 500 mg of elemental calcium by mouth at bedtime    . PACERONE 200 MG tablet TAKE 1/2 TABLET BY MOUTH DAILY. 45 tablet 2  . Psyllium (METAMUCIL PO) Take 1 teaspoon by mouth daily with water    . warfarin (COUMADIN) 3 MG tablet TAKE 1/2 TO 1 TABLET BY MOUTH DAILY AS DIRECTED BY COUMADIN CLINIC 90 tablet 1   No current facility-administered medications for this visit.     Allergies:   Metformin and related; Tikosyn [dofetilide]; and Caffeine   Social History:  The patient  reports that she quit smoking about 35 years ago. Her smoking use included cigarettes. She has a 5.00 pack-year smoking history. She has never used smokeless tobacco. She reports that she does not drink alcohol or use drugs.   Family History:  The patient's  family history includes Breast cancer in her maternal aunt; Colon cancer in her paternal grandmother; Diabetes in her maternal uncle and paternal uncle; Emphysema in her father; Hypertension in her mother.   ROS:  Please see the history of present illness.   All other systems are personally reviewed and negative.    Exam:    Vital Signs:  BP (!) 99/57   Pulse 69   Wt 148 lb (67.1 kg)   BMI 27.96 kg/m   Well sounding    Labs/Other Tests and Data Reviewed:    Recent Labs: 12/12/2017: ALT 6; BUN 26; Creatinine, Ser 1.61; Hemoglobin 12.8; Platelets 302; Potassium 5.1; Sodium 142; TSH 3.930   Wt Readings from Last 3 Encounters:  06/14/18 148 lb (67.1 kg)  11/28/17 153 lb (69.4 kg)  05/09/17 154 lb (69.9 kg)     Other studies personally reviewed: Additional studies/ records that were reviewed today include: my prior notes, Dr Jacalyn Lefevre notes   Last device remote is reviewed from [place] PDF dated 03/24/2018 which reveals normal device function, no arrhythmias    ASSESSMENT & PLAN:  1.  Chronic systolic dysfunction/ ischemic CM CHF symptoms Recent remotes are reviewed and normal  2. Persistent afib No afib by remote interrogation Continue amiodarone 100mg  daily PCP following LFTs/TFTS  3. S/p VF arrest No therapy required from her ICD Normal ICD function  4. COVID 19 screen The patient denies symptoms of COVID 19 at this time.  The importance of social distancing was discussed today.  Follow-up: 12 months with EP NP Next remote: 06/2018  Current medicines are reviewed at length with the patient today.   The patient does not have concerns regarding her medicines.  The following changes were made today:  none  Labs/ tests ordered today include:  No orders of the defined types were placed in this encounter.   Patient Risk:  after full review of this patients clinical status, I feel that they are at moderate risk at this time.  Today, I have spent 12 minutes with the patient with telehealth technology discussing CHF .    Army Fossa, MD  06/14/2018 3:08 PM     [facility] [address] [place] [address] [phone] (office) [phone] (fax)

## 2018-06-19 ENCOUNTER — Telehealth: Payer: Self-pay | Admitting: Pharmacist

## 2018-06-19 DIAGNOSIS — Z7901 Long term (current) use of anticoagulants: Secondary | ICD-10-CM | POA: Diagnosis not present

## 2018-06-19 NOTE — Telephone Encounter (Signed)
Patient husband called. Patient had a bout of diverticulitis a few weeks ago. Was put on amoxicillin. Last dose a week ago. Patient INR 1 week ago was 6. Her PCP told her to hold 2 days and then start taking 1/2 tab daily (previous dose was 1/2 tab daily excpet 1 tab on Sunday and Thursday). INR today was 4.6. Patient would like [place] to start managing again.  Husband advised to have patient skip today and tomorrows dose and then resume 1/2 tablet daily. INR check in 1 week.

## 2018-06-22 ENCOUNTER — Telehealth: Payer: Self-pay

## 2018-06-22 NOTE — Telephone Encounter (Signed)

## 2018-06-23 ENCOUNTER — Telehealth: Payer: Self-pay

## 2018-06-23 ENCOUNTER — Ambulatory Visit (INDEPENDENT_AMBULATORY_CARE_PROVIDER_SITE_OTHER): Payer: Medicare Other | Admitting: *Deleted

## 2018-06-23 ENCOUNTER — Other Ambulatory Visit: Payer: Self-pay

## 2018-06-23 ENCOUNTER — Encounter: Payer: Medicare Other | Admitting: Internal Medicine

## 2018-06-23 DIAGNOSIS — I4819 Other persistent atrial fibrillation: Secondary | ICD-10-CM

## 2018-06-23 DIAGNOSIS — I255 Ischemic cardiomyopathy: Secondary | ICD-10-CM | POA: Diagnosis not present

## 2018-06-23 LAB — CUP PACEART REMOTE DEVICE CHECK
Battery Remaining Longevity: 38 mo
Battery Remaining Percentage: 39 %
Battery Voltage: 2.95 V
Brady Statistic AP VP Percent: 1 %
Brady Statistic AP VS Percent: 64 %
Brady Statistic AS VP Percent: 1 %
Brady Statistic AS VS Percent: 36 %
Brady Statistic RA Percent Paced: 63 %
Brady Statistic RV Percent Paced: 1 %
Date Time Interrogation Session: 20200417060016
HighPow Impedance: 64 Ohm
HighPow Impedance: 64 Ohm
Implantable Lead Implant Date: 20140403
Implantable Lead Implant Date: 20140403
Implantable Lead Location: 753859
Implantable Lead Location: 753860
Implantable Pulse Generator Implant Date: 20140403
Lead Channel Impedance Value: 400 Ohm
Lead Channel Impedance Value: 700 Ohm
Lead Channel Pacing Threshold Amplitude: 1 V
Lead Channel Pacing Threshold Amplitude: 1 V
Lead Channel Pacing Threshold Pulse Width: 0.5 ms
Lead Channel Pacing Threshold Pulse Width: 0.5 ms
Lead Channel Sensing Intrinsic Amplitude: 12 mV
Lead Channel Sensing Intrinsic Amplitude: 3.1 mV
Lead Channel Setting Pacing Amplitude: 2 V
Lead Channel Setting Pacing Amplitude: 2.5 V
Lead Channel Setting Pacing Pulse Width: 0.5 ms
Lead Channel Setting Sensing Sensitivity: 0.5 mV
Pulse Gen Serial Number: 7080516

## 2018-06-23 NOTE — Telephone Encounter (Signed)

## 2018-06-26 ENCOUNTER — Other Ambulatory Visit: Payer: Self-pay

## 2018-06-26 ENCOUNTER — Ambulatory Visit (INDEPENDENT_AMBULATORY_CARE_PROVIDER_SITE_OTHER): Payer: Medicare Other | Admitting: Pharmacist Clinician (PhC)/ Clinical Pharmacy Specialist

## 2018-06-26 DIAGNOSIS — N183 Chronic kidney disease, stage 3 (moderate): Secondary | ICD-10-CM | POA: Diagnosis not present

## 2018-06-26 DIAGNOSIS — I129 Hypertensive chronic kidney disease with stage 1 through stage 4 chronic kidney disease, or unspecified chronic kidney disease: Secondary | ICD-10-CM | POA: Diagnosis not present

## 2018-06-26 DIAGNOSIS — D631 Anemia in chronic kidney disease: Secondary | ICD-10-CM | POA: Diagnosis not present

## 2018-06-26 DIAGNOSIS — I359 Nonrheumatic aortic valve disorder, unspecified: Secondary | ICD-10-CM

## 2018-06-26 DIAGNOSIS — I059 Rheumatic mitral valve disease, unspecified: Secondary | ICD-10-CM | POA: Diagnosis not present

## 2018-06-26 DIAGNOSIS — Z7901 Long term (current) use of anticoagulants: Secondary | ICD-10-CM | POA: Diagnosis not present

## 2018-06-26 DIAGNOSIS — I4891 Unspecified atrial fibrillation: Secondary | ICD-10-CM | POA: Diagnosis not present

## 2018-06-26 DIAGNOSIS — N2581 Secondary hyperparathyroidism of renal origin: Secondary | ICD-10-CM | POA: Diagnosis not present

## 2018-06-26 DIAGNOSIS — N200 Calculus of kidney: Secondary | ICD-10-CM | POA: Diagnosis not present

## 2018-06-26 LAB — POCT INR: INR: 4.2 — AB (ref 2.0–3.0)

## 2018-06-28 NOTE — Progress Notes (Signed)
Remote ICD transmission.   

## 2018-07-04 ENCOUNTER — Telehealth: Payer: Self-pay

## 2018-07-04 NOTE — Telephone Encounter (Signed)

## 2018-07-05 ENCOUNTER — Other Ambulatory Visit: Payer: Self-pay

## 2018-07-05 ENCOUNTER — Ambulatory Visit (INDEPENDENT_AMBULATORY_CARE_PROVIDER_SITE_OTHER): Payer: Medicare Other

## 2018-07-05 DIAGNOSIS — Z7901 Long term (current) use of anticoagulants: Secondary | ICD-10-CM

## 2018-07-05 DIAGNOSIS — I059 Rheumatic mitral valve disease, unspecified: Secondary | ICD-10-CM

## 2018-07-05 DIAGNOSIS — I359 Nonrheumatic aortic valve disorder, unspecified: Secondary | ICD-10-CM

## 2018-07-05 DIAGNOSIS — I38 Endocarditis, valve unspecified: Secondary | ICD-10-CM

## 2018-07-05 DIAGNOSIS — I4891 Unspecified atrial fibrillation: Secondary | ICD-10-CM

## 2018-07-05 LAB — POCT INR: INR: 4.2 — AB (ref 2.0–3.0)

## 2018-07-05 MED ORDER — WARFARIN SODIUM 1 MG PO TABS: ORAL_TABLET | ORAL | 1 refills | Status: DC

## 2018-07-07 ENCOUNTER — Other Ambulatory Visit: Payer: Self-pay | Admitting: Cardiology

## 2018-07-07 DIAGNOSIS — I255 Ischemic cardiomyopathy: Secondary | ICD-10-CM

## 2018-07-07 NOTE — Telephone Encounter (Signed)
Furosemide refilled. 

## 2018-07-10 ENCOUNTER — Ambulatory Visit (INDEPENDENT_AMBULATORY_CARE_PROVIDER_SITE_OTHER): Payer: Medicare Other

## 2018-07-10 ENCOUNTER — Other Ambulatory Visit: Payer: Self-pay

## 2018-07-10 DIAGNOSIS — Z95810 Presence of automatic (implantable) cardiac defibrillator: Secondary | ICD-10-CM

## 2018-07-10 DIAGNOSIS — I509 Heart failure, unspecified: Secondary | ICD-10-CM

## 2018-07-10 NOTE — Progress Notes (Signed)
EPIC Encounter for ICM Monitoring  Patient Name: [name] is a 78 y.o. female Date: 07/10/2018 Primary Care Physican: Velna Hatchet, MD Primary Cardiologist:Crenshaw Electrophysiologist: Allred 07/10/2018 Weight:145lbs    Heart Failure questions reviewed, pt asymptomatic.  CorVue Thoracic impedance abnormal suggesting fluid accumulation starting 4/26 and almost at baseline today, 07/10/2018.   Prescribed:Furosemide 20 mg by mouth daily. Can take an extra 1 tablet daily as needed.  LABS: 12/12/2017 Creatinine 1.61, BUN 26, Potassium 5.1, Sodium 142, GFR 31-36  Recommendations:  Advised if she has any fluid symptoms to take extra Furosemide as prescribed.   Follow-up plan: ICM clinic phone appointment on5/02/2019 to recheck fluid levels.   Copy of ICM check sent to Dr.Allred and Dr Stanford Breed.    3 month ICM trend: 07/10/2018    1 Year ICM trend:       Rosalene Billings, RN 07/10/2018 12:27 PM

## 2018-07-12 ENCOUNTER — Telehealth: Payer: Self-pay

## 2018-07-12 NOTE — Telephone Encounter (Signed)

## 2018-07-14 ENCOUNTER — Ambulatory Visit (INDEPENDENT_AMBULATORY_CARE_PROVIDER_SITE_OTHER): Payer: Medicare Other | Admitting: *Deleted

## 2018-07-14 ENCOUNTER — Other Ambulatory Visit: Payer: Self-pay

## 2018-07-14 DIAGNOSIS — I4891 Unspecified atrial fibrillation: Secondary | ICD-10-CM

## 2018-07-14 DIAGNOSIS — Z7901 Long term (current) use of anticoagulants: Secondary | ICD-10-CM | POA: Diagnosis not present

## 2018-07-14 DIAGNOSIS — I38 Endocarditis, valve unspecified: Secondary | ICD-10-CM

## 2018-07-14 DIAGNOSIS — I359 Nonrheumatic aortic valve disorder, unspecified: Secondary | ICD-10-CM | POA: Diagnosis not present

## 2018-07-14 DIAGNOSIS — I059 Rheumatic mitral valve disease, unspecified: Secondary | ICD-10-CM

## 2018-07-14 LAB — POCT INR: INR: 2.2 (ref 2.0–3.0)

## 2018-07-18 ENCOUNTER — Other Ambulatory Visit: Payer: Self-pay

## 2018-07-18 ENCOUNTER — Ambulatory Visit (INDEPENDENT_AMBULATORY_CARE_PROVIDER_SITE_OTHER): Payer: Medicare Other

## 2018-07-18 DIAGNOSIS — I509 Heart failure, unspecified: Secondary | ICD-10-CM

## 2018-07-18 DIAGNOSIS — Z95810 Presence of automatic (implantable) cardiac defibrillator: Secondary | ICD-10-CM

## 2018-07-21 NOTE — Progress Notes (Signed)
EPIC Encounter for ICM Monitoring  Patient Name: [name] is a 78 y.o. female Date: 07/21/2018 Primary Care Physican: Velna Hatchet, MD Primary Cardiologist:Crenshaw Electrophysiologist: Allred 5/4/2020Weight:145lbs    Heart Failure questions reviewed, pt asymptomatic.  CorVue Thoracic impedance returned normal.  Prescribed:Furosemide 20 mg by mouth daily. Can take an extra 1 tablet daily as needed.  LABS: 12/12/2017 Creatinine 1.61, BUN 26, Potassium 5.1, Sodium 142, GFR 31-36  Recommendations:  No changes and encouraged to call if she experiences any fluid symptoms.   Follow-up plan: ICM clinic phone appointment on6/10/2018.   Copy of ICM check sent to Dr.Allred.    3 month ICM trend: 07/18/2018    1 Year ICM trend:       Rosalene Billings, RN 07/21/2018 1:01 PM

## 2018-07-27 ENCOUNTER — Telehealth: Payer: Self-pay

## 2018-07-27 NOTE — Telephone Encounter (Signed)
lmom for prescreen  

## 2018-07-28 ENCOUNTER — Other Ambulatory Visit: Payer: Self-pay

## 2018-07-28 ENCOUNTER — Ambulatory Visit (INDEPENDENT_AMBULATORY_CARE_PROVIDER_SITE_OTHER): Payer: Medicare Other

## 2018-07-28 DIAGNOSIS — I38 Endocarditis, valve unspecified: Secondary | ICD-10-CM | POA: Diagnosis not present

## 2018-07-28 DIAGNOSIS — I359 Nonrheumatic aortic valve disorder, unspecified: Secondary | ICD-10-CM | POA: Diagnosis not present

## 2018-07-28 DIAGNOSIS — I4891 Unspecified atrial fibrillation: Secondary | ICD-10-CM

## 2018-07-28 DIAGNOSIS — Z7901 Long term (current) use of anticoagulants: Secondary | ICD-10-CM | POA: Diagnosis not present

## 2018-07-28 DIAGNOSIS — I059 Rheumatic mitral valve disease, unspecified: Secondary | ICD-10-CM | POA: Diagnosis not present

## 2018-07-28 LAB — POCT INR: INR: 5.2 — AB (ref 2.0–3.0)

## 2018-07-28 NOTE — Patient Instructions (Signed)
Description   Called spoke with pt's husband, advised to have pt skip today and tomorrow's dosage of Coumadin, then start taking 1mg  daily except 1.5mg  on Mondays and Fridays. Changed pt's Warfarin tablet from 3mg  to 1mg  tablets. Pt will resume drinking protein shake (Glucerna) weekly. Recheck in 1 week.  Call if any new medications [phone].

## 2018-08-03 ENCOUNTER — Telehealth: Payer: Self-pay

## 2018-08-03 NOTE — Telephone Encounter (Signed)
1. COVID-19 Pre-Screening Questions:  . In the past 7 to 10 days have you had a cough,  shortness of breath, headache, congestion, fever (100 or greater) body aches, chills, sore throat, or sudden loss of taste or sense of smell?no . Have you been around anyone with known Covid 19.no . Have you been around anyone who is awaiting Covid 19 test results in the past 7 to 10 days?no . Have you been around anyone who has been exposed to Covid 19, or has mentioned symptoms of Covid 19 within the past 7 to 10 days?no  2. Pt advised of visitor restrictions (no visitors allowed except if needed to conduct the visit). Also advised to arrive at appointment time and wear a mask.  Yes

## 2018-08-04 ENCOUNTER — Ambulatory Visit (INDEPENDENT_AMBULATORY_CARE_PROVIDER_SITE_OTHER): Payer: Medicare Other

## 2018-08-04 ENCOUNTER — Other Ambulatory Visit: Payer: Self-pay

## 2018-08-04 DIAGNOSIS — I38 Endocarditis, valve unspecified: Secondary | ICD-10-CM | POA: Diagnosis not present

## 2018-08-04 DIAGNOSIS — I4891 Unspecified atrial fibrillation: Secondary | ICD-10-CM | POA: Diagnosis not present

## 2018-08-04 DIAGNOSIS — I359 Nonrheumatic aortic valve disorder, unspecified: Secondary | ICD-10-CM | POA: Diagnosis not present

## 2018-08-04 DIAGNOSIS — Z7901 Long term (current) use of anticoagulants: Secondary | ICD-10-CM | POA: Diagnosis not present

## 2018-08-04 DIAGNOSIS — I059 Rheumatic mitral valve disease, unspecified: Secondary | ICD-10-CM | POA: Diagnosis not present

## 2018-08-04 LAB — POCT INR: INR: 3.2 — AB (ref 2.0–3.0)

## 2018-08-04 NOTE — Patient Instructions (Signed)
Description   Called spoke with pt's husband, advised to have pt skip today's dosage of Coumadin, then resume same dosage 1mg  daily except 1.5mg  on Mondays and Fridays. Changed pt's Warfarin tablet from 3mg  to 1mg  tablets. Pt will continue drinking protein shake (Glucerna) weekly. Recheck in 2 weeks.  Call if any new medications [phone].

## 2018-08-07 DIAGNOSIS — I5022 Chronic systolic (congestive) heart failure: Secondary | ICD-10-CM | POA: Diagnosis not present

## 2018-08-07 DIAGNOSIS — I130 Hypertensive heart and chronic kidney disease with heart failure and stage 1 through stage 4 chronic kidney disease, or unspecified chronic kidney disease: Secondary | ICD-10-CM | POA: Diagnosis not present

## 2018-08-07 DIAGNOSIS — R946 Abnormal results of thyroid function studies: Secondary | ICD-10-CM | POA: Diagnosis not present

## 2018-08-07 DIAGNOSIS — E11638 Type 2 diabetes mellitus with other oral complications: Secondary | ICD-10-CM | POA: Diagnosis not present

## 2018-08-07 DIAGNOSIS — E559 Vitamin D deficiency, unspecified: Secondary | ICD-10-CM | POA: Diagnosis not present

## 2018-08-07 DIAGNOSIS — N184 Chronic kidney disease, stage 4 (severe): Secondary | ICD-10-CM | POA: Diagnosis not present

## 2018-08-08 DIAGNOSIS — R82998 Other abnormal findings in urine: Secondary | ICD-10-CM | POA: Diagnosis not present

## 2018-08-08 DIAGNOSIS — I10 Essential (primary) hypertension: Secondary | ICD-10-CM | POA: Diagnosis not present

## 2018-08-10 ENCOUNTER — Telehealth: Payer: Self-pay

## 2018-08-10 ENCOUNTER — Other Ambulatory Visit: Payer: Self-pay | Admitting: Internal Medicine

## 2018-08-10 DIAGNOSIS — I255 Ischemic cardiomyopathy: Secondary | ICD-10-CM

## 2018-08-10 NOTE — Telephone Encounter (Signed)

## 2018-08-14 ENCOUNTER — Ambulatory Visit (INDEPENDENT_AMBULATORY_CARE_PROVIDER_SITE_OTHER): Payer: Medicare Other

## 2018-08-14 DIAGNOSIS — E1121 Type 2 diabetes mellitus with diabetic nephropathy: Secondary | ICD-10-CM | POA: Diagnosis not present

## 2018-08-14 DIAGNOSIS — I130 Hypertensive heart and chronic kidney disease with heart failure and stage 1 through stage 4 chronic kidney disease, or unspecified chronic kidney disease: Secondary | ICD-10-CM | POA: Diagnosis not present

## 2018-08-14 DIAGNOSIS — Z1339 Encounter for screening examination for other mental health and behavioral disorders: Secondary | ICD-10-CM | POA: Diagnosis not present

## 2018-08-14 DIAGNOSIS — I480 Paroxysmal atrial fibrillation: Secondary | ICD-10-CM | POA: Diagnosis not present

## 2018-08-14 DIAGNOSIS — E1169 Type 2 diabetes mellitus with other specified complication: Secondary | ICD-10-CM | POA: Diagnosis not present

## 2018-08-14 DIAGNOSIS — I255 Ischemic cardiomyopathy: Secondary | ICD-10-CM

## 2018-08-14 DIAGNOSIS — R809 Proteinuria, unspecified: Secondary | ICD-10-CM | POA: Diagnosis not present

## 2018-08-14 DIAGNOSIS — Z0000 Encounter for general adult medical examination without abnormal findings: Secondary | ICD-10-CM | POA: Diagnosis not present

## 2018-08-14 DIAGNOSIS — I712 Thoracic aortic aneurysm, without rupture: Secondary | ICD-10-CM | POA: Diagnosis not present

## 2018-08-14 DIAGNOSIS — Z95810 Presence of automatic (implantable) cardiac defibrillator: Secondary | ICD-10-CM

## 2018-08-14 DIAGNOSIS — Z1331 Encounter for screening for depression: Secondary | ICD-10-CM | POA: Diagnosis not present

## 2018-08-14 DIAGNOSIS — M810 Age-related osteoporosis without current pathological fracture: Secondary | ICD-10-CM | POA: Diagnosis not present

## 2018-08-14 DIAGNOSIS — N184 Chronic kidney disease, stage 4 (severe): Secondary | ICD-10-CM | POA: Diagnosis not present

## 2018-08-14 DIAGNOSIS — I5022 Chronic systolic (congestive) heart failure: Secondary | ICD-10-CM | POA: Diagnosis not present

## 2018-08-14 DIAGNOSIS — Z7901 Long term (current) use of anticoagulants: Secondary | ICD-10-CM | POA: Diagnosis not present

## 2018-08-14 DIAGNOSIS — I2729 Other secondary pulmonary hypertension: Secondary | ICD-10-CM | POA: Diagnosis not present

## 2018-08-15 ENCOUNTER — Telehealth: Payer: Self-pay

## 2018-08-15 NOTE — Telephone Encounter (Signed)
Left message for patient to remind of missed remote transmission.  

## 2018-08-18 ENCOUNTER — Ambulatory Visit (INDEPENDENT_AMBULATORY_CARE_PROVIDER_SITE_OTHER): Payer: Medicare Other | Admitting: *Deleted

## 2018-08-18 ENCOUNTER — Other Ambulatory Visit: Payer: Self-pay

## 2018-08-18 DIAGNOSIS — I38 Endocarditis, valve unspecified: Secondary | ICD-10-CM | POA: Diagnosis not present

## 2018-08-18 DIAGNOSIS — Z7901 Long term (current) use of anticoagulants: Secondary | ICD-10-CM

## 2018-08-18 DIAGNOSIS — I4891 Unspecified atrial fibrillation: Secondary | ICD-10-CM | POA: Diagnosis not present

## 2018-08-18 DIAGNOSIS — I059 Rheumatic mitral valve disease, unspecified: Secondary | ICD-10-CM

## 2018-08-18 LAB — POCT INR: INR: 2.7 (ref 2.0–3.0)

## 2018-08-18 NOTE — Progress Notes (Signed)
EPIC Encounter for ICM Monitoring  Patient Name: [name] is a 78 y.o. female Date: 08/18/2018 Primary Care Physican: Velna Hatchet, MD Primary Cardiologist:Crenshaw Electrophysiologist: Allred 5/4/2020Weight:145lbs    Spoke with husband. Heart Failure questions reviewed, pt asymptomatic but has occasional swelling in feet.  CorVueThoracic impedancenormal but suggesting possible fluid accumulation starting 5/26 and ending 6/6.  Prescribed:Furosemide 20 mg by mouth daily. Can take an extra 1 tablet daily as needed.  LABS: 12/12/2017 Creatinine 1.61, BUN 26, Potassium 5.1, Sodium 142, GFR 31-36  Recommendations:No changes and encouraged to call if she experiences any fluid symptoms.  Follow-up plan: ICM clinic phone appointment on7/16/2020.   Copy of ICM check sent to Dr.Allred.  3 month ICM trend: 08/17/2018    1 Year ICM trend:       Rosalene Billings, RN 08/18/2018 9:40 AM

## 2018-08-18 NOTE — Patient Instructions (Signed)
Description   Continue taking 1mg  daily except 1.5mg  on Mondays and Fridays. Changed pt's Warfarin tablet from 3mg  to 1mg  tablets. Pt will continue drinking protein shake (Glucerna) weekly. Recheck in 3 weeks.  Call if any new medications [phone].

## 2018-08-22 DIAGNOSIS — M810 Age-related osteoporosis without current pathological fracture: Secondary | ICD-10-CM | POA: Diagnosis not present

## 2018-08-22 DIAGNOSIS — Z8262 Family history of osteoporosis: Secondary | ICD-10-CM | POA: Diagnosis not present

## 2018-08-22 DIAGNOSIS — M40204 Unspecified kyphosis, thoracic region: Secondary | ICD-10-CM | POA: Diagnosis not present

## 2018-08-28 ENCOUNTER — Telehealth: Payer: Self-pay

## 2018-08-28 NOTE — Telephone Encounter (Signed)

## 2018-09-04 ENCOUNTER — Ambulatory Visit (INDEPENDENT_AMBULATORY_CARE_PROVIDER_SITE_OTHER): Payer: Medicare Other | Admitting: *Deleted

## 2018-09-04 ENCOUNTER — Other Ambulatory Visit: Payer: Self-pay

## 2018-09-04 DIAGNOSIS — I4891 Unspecified atrial fibrillation: Secondary | ICD-10-CM

## 2018-09-04 DIAGNOSIS — I059 Rheumatic mitral valve disease, unspecified: Secondary | ICD-10-CM | POA: Diagnosis not present

## 2018-09-04 DIAGNOSIS — I38 Endocarditis, valve unspecified: Secondary | ICD-10-CM

## 2018-09-04 DIAGNOSIS — Z7901 Long term (current) use of anticoagulants: Secondary | ICD-10-CM

## 2018-09-04 LAB — POCT INR: INR: 2.8 (ref 2.0–3.0)

## 2018-09-04 NOTE — Patient Instructions (Signed)
Description   Continue taking 1mg  daily except 1.5mg  on Mondays and Fridays. Changed pt's Warfarin tablet from 3mg  to 1mg  tablets. Pt will continue drinking protein shake (Glucerna) weekly. Recheck in 4 weeks.  Call if any new medications [phone].

## 2018-09-21 ENCOUNTER — Ambulatory Visit (INDEPENDENT_AMBULATORY_CARE_PROVIDER_SITE_OTHER): Payer: Medicare Other

## 2018-09-21 DIAGNOSIS — I509 Heart failure, unspecified: Secondary | ICD-10-CM

## 2018-09-21 DIAGNOSIS — Z95810 Presence of automatic (implantable) cardiac defibrillator: Secondary | ICD-10-CM

## 2018-09-22 ENCOUNTER — Ambulatory Visit (INDEPENDENT_AMBULATORY_CARE_PROVIDER_SITE_OTHER): Payer: Medicare Other | Admitting: *Deleted

## 2018-09-22 DIAGNOSIS — I255 Ischemic cardiomyopathy: Secondary | ICD-10-CM | POA: Diagnosis not present

## 2018-09-22 LAB — CUP PACEART REMOTE DEVICE CHECK
Date Time Interrogation Session: 20200717095945
Implantable Lead Implant Date: 20140403
Implantable Lead Implant Date: 20140403
Implantable Lead Location: 753859
Implantable Lead Location: 753860
Implantable Pulse Generator Implant Date: 20140403
Pulse Gen Serial Number: 7080516

## 2018-09-22 NOTE — Progress Notes (Signed)
EPIC Encounter for ICM Monitoring  Patient Name: [name] is a 78 y.o. female Date: 09/22/2018 Primary Care Physican: Velna Hatchet, MD Primary Cardiologist:Crenshaw Electrophysiologist: Allred 5/4/2020Weight:145lbs    Spoke with husband. He said she is feeling fine and was not at home.  Discussed limiting salt intake to 2000 mg which will help decrease episodes of fluid retention.  CorVueThoracic impedancenormal but suggesting possible fluid accumulation starting 6/20 and ending 7/7.  Prescribed:Furosemide 20 mg by mouth daily. Can take an extra 1 tablet daily as needed.  LABS: 12/12/2017 Creatinine 1.61, BUN 26, Potassium 5.1, Sodium 142, GFR 31-36  Recommendations:No changes and encouraged to call if she experiences any fluid symptoms.  Follow-up plan: ICM clinic phone appointment on8/17/2020.   Copy of ICM check sent to Dr.Allred.   3 month ICM trend: 09/21/2018    1 Year ICM trend:       Rosalene Billings, RN 09/22/2018 2:33 PM

## 2018-09-25 DIAGNOSIS — R5383 Other fatigue: Secondary | ICD-10-CM | POA: Diagnosis not present

## 2018-09-25 DIAGNOSIS — M810 Age-related osteoporosis without current pathological fracture: Secondary | ICD-10-CM | POA: Diagnosis not present

## 2018-09-25 DIAGNOSIS — E559 Vitamin D deficiency, unspecified: Secondary | ICD-10-CM | POA: Diagnosis not present

## 2018-09-27 ENCOUNTER — Encounter: Payer: Self-pay | Admitting: Cardiology

## 2018-09-27 NOTE — Progress Notes (Signed)
Remote ICD transmission.   

## 2018-09-28 ENCOUNTER — Telehealth: Payer: Self-pay | Admitting: *Deleted

## 2018-09-28 NOTE — Telephone Encounter (Signed)

## 2018-10-02 ENCOUNTER — Other Ambulatory Visit: Payer: Self-pay

## 2018-10-02 ENCOUNTER — Ambulatory Visit (INDEPENDENT_AMBULATORY_CARE_PROVIDER_SITE_OTHER): Payer: Medicare Other | Admitting: *Deleted

## 2018-10-02 DIAGNOSIS — I38 Endocarditis, valve unspecified: Secondary | ICD-10-CM | POA: Diagnosis not present

## 2018-10-02 DIAGNOSIS — Z7901 Long term (current) use of anticoagulants: Secondary | ICD-10-CM

## 2018-10-02 DIAGNOSIS — I4891 Unspecified atrial fibrillation: Secondary | ICD-10-CM

## 2018-10-02 DIAGNOSIS — I059 Rheumatic mitral valve disease, unspecified: Secondary | ICD-10-CM | POA: Diagnosis not present

## 2018-10-02 LAB — POCT INR: INR: 1.7 — AB (ref 2.0–3.0)

## 2018-10-02 NOTE — Patient Instructions (Signed)
Description   Today take 2 mgs (2 tablets) then continue taking 1mg  daily except 1.5mg  on Mondays and Fridays. Changed pt's Warfarin tablet from 3mg  to 1mg  tablets. Pt will continue drinking protein shake (Glucerna) weekly. Recheck in 3 weeks.  Call if any new medications [phone].

## 2018-10-03 ENCOUNTER — Ambulatory Visit (INDEPENDENT_AMBULATORY_CARE_PROVIDER_SITE_OTHER): Payer: Medicare Other | Admitting: Podiatry

## 2018-10-03 ENCOUNTER — Encounter: Payer: Self-pay | Admitting: Podiatry

## 2018-10-03 DIAGNOSIS — D689 Coagulation defect, unspecified: Secondary | ICD-10-CM

## 2018-10-03 DIAGNOSIS — B351 Tinea unguium: Secondary | ICD-10-CM | POA: Diagnosis not present

## 2018-10-03 DIAGNOSIS — E119 Type 2 diabetes mellitus without complications: Secondary | ICD-10-CM

## 2018-10-03 DIAGNOSIS — I255 Ischemic cardiomyopathy: Secondary | ICD-10-CM

## 2018-10-03 DIAGNOSIS — M79674 Pain in right toe(s): Secondary | ICD-10-CM | POA: Diagnosis not present

## 2018-10-03 DIAGNOSIS — M79675 Pain in left toe(s): Secondary | ICD-10-CM | POA: Diagnosis not present

## 2018-10-03 DIAGNOSIS — M810 Age-related osteoporosis without current pathological fracture: Secondary | ICD-10-CM | POA: Diagnosis not present

## 2018-10-03 DIAGNOSIS — M40209 Unspecified kyphosis, site unspecified: Secondary | ICD-10-CM | POA: Diagnosis not present

## 2018-10-03 NOTE — Progress Notes (Signed)
This patient presents to the office with chief complaint of long thick nails and diabetic feet. She presents to the office with her husband.   This patient  says there  is  no pain and discomfort in her  feet.  This patient says there are long thick painful nails.  These nails are painful walking and wearing shoes.  Patient has no history of infection or drainage from both feet.  Patient is unable to  self treat his own nails . This patient presents  to the office today for treatment of the  long nails and a foot evaluation due to history of  Diabetes.  Patient is taking coumadin.  General Appearance  Alert, conversant and in no acute stress.  Vascular  Dorsalis pedis and posterior tibial  pulses are palpable  bilaterally.  Capillary return is within normal limits  bilaterally. Temperature is within normal limits  bilaterally.  Neurologic  Senn-Weinstein monofilament wire test within normal limits  bilaterally. Muscle power within normal limits bilaterally.  Nails Thick disfigured discolored nails with subungual debris  from hallux to fifth toes bilaterally. No evidence of bacterial infection or drainage bilaterally.  Orthopedic  No limitations of motion of motion feet .  No crepitus or effusions noted.  No bony pathology or digital deformities noted.  Midfoot arthritis right foot.  Skin  normotropic skin with no porokeratosis noted bilaterally.  No signs of infections or ulcers noted.     Onychomycosis  Diabetes with no foot complications  IE  Debride nails x 10.  A diabetic foot exam was performed and there is no evidence of any vascular or neurologic pathology.   RTC 3 months.   Gardiner Barefoot DPM

## 2018-10-12 DIAGNOSIS — R8279 Other abnormal findings on microbiological examination of urine: Secondary | ICD-10-CM | POA: Diagnosis not present

## 2018-10-12 DIAGNOSIS — N200 Calculus of kidney: Secondary | ICD-10-CM | POA: Diagnosis not present

## 2018-10-12 DIAGNOSIS — N184 Chronic kidney disease, stage 4 (severe): Secondary | ICD-10-CM | POA: Diagnosis not present

## 2018-10-13 DIAGNOSIS — R54 Age-related physical debility: Secondary | ICD-10-CM | POA: Diagnosis not present

## 2018-10-13 DIAGNOSIS — M6281 Muscle weakness (generalized): Secondary | ICD-10-CM | POA: Diagnosis not present

## 2018-10-13 DIAGNOSIS — M546 Pain in thoracic spine: Secondary | ICD-10-CM | POA: Diagnosis not present

## 2018-10-17 DIAGNOSIS — M546 Pain in thoracic spine: Secondary | ICD-10-CM | POA: Diagnosis not present

## 2018-10-17 DIAGNOSIS — R54 Age-related physical debility: Secondary | ICD-10-CM | POA: Diagnosis not present

## 2018-10-17 DIAGNOSIS — M6281 Muscle weakness (generalized): Secondary | ICD-10-CM | POA: Diagnosis not present

## 2018-10-19 DIAGNOSIS — R54 Age-related physical debility: Secondary | ICD-10-CM | POA: Diagnosis not present

## 2018-10-19 DIAGNOSIS — M6281 Muscle weakness (generalized): Secondary | ICD-10-CM | POA: Diagnosis not present

## 2018-10-19 DIAGNOSIS — M546 Pain in thoracic spine: Secondary | ICD-10-CM | POA: Diagnosis not present

## 2018-10-23 ENCOUNTER — Other Ambulatory Visit: Payer: Self-pay

## 2018-10-23 ENCOUNTER — Ambulatory Visit (INDEPENDENT_AMBULATORY_CARE_PROVIDER_SITE_OTHER): Payer: Medicare Other | Admitting: *Deleted

## 2018-10-23 ENCOUNTER — Ambulatory Visit (INDEPENDENT_AMBULATORY_CARE_PROVIDER_SITE_OTHER): Payer: Medicare Other

## 2018-10-23 DIAGNOSIS — N189 Chronic kidney disease, unspecified: Secondary | ICD-10-CM | POA: Diagnosis not present

## 2018-10-23 DIAGNOSIS — I38 Endocarditis, valve unspecified: Secondary | ICD-10-CM | POA: Diagnosis not present

## 2018-10-23 DIAGNOSIS — Z95810 Presence of automatic (implantable) cardiac defibrillator: Secondary | ICD-10-CM

## 2018-10-23 DIAGNOSIS — I059 Rheumatic mitral valve disease, unspecified: Secondary | ICD-10-CM

## 2018-10-23 DIAGNOSIS — Z7901 Long term (current) use of anticoagulants: Secondary | ICD-10-CM

## 2018-10-23 DIAGNOSIS — I509 Heart failure, unspecified: Secondary | ICD-10-CM | POA: Diagnosis not present

## 2018-10-23 DIAGNOSIS — I4891 Unspecified atrial fibrillation: Secondary | ICD-10-CM

## 2018-10-23 DIAGNOSIS — N183 Chronic kidney disease, stage 3 (moderate): Secondary | ICD-10-CM | POA: Diagnosis not present

## 2018-10-23 LAB — POCT INR: INR: 1.7 — AB (ref 2.0–3.0)

## 2018-10-23 NOTE — Patient Instructions (Addendum)
Description   Today take 2 mgs (2 tablets) then start taking 1 tablet daily, except for 1.5 tablets on Monday, Wednesday and Friday. Pt will continue drinking protein shake (Glucerna) weekly. Recheck in 2-3 weeks.  Call if any new medications [phone].

## 2018-10-23 NOTE — Progress Notes (Signed)
EPIC Encounter for ICM Monitoring  Patient Name: [name] is a 78 y.o. female Date: 10/23/2018 Primary Care Physican: Velna Hatchet, MD Primary Cardiologist:Crenshaw Electrophysiologist: Allred 5/4/2020Weight:145lbs    Spoke with husband.Pt is feeling fine but was not at home.    CorVueThoracic impedancesuggesting possible fluid accumulation starting 10/05/2018.  Impedance pattern of possible fluid accumulation with increased in frequency and longer episodes.   Prescribed:Furosemide 20 mg by mouth daily. Can take an extra 1 tablet daily as needed.  LABS: 12/12/2017 Creatinine 1.61, BUN 26, Potassium 5.1, Sodium 142, GFR 31-36  Recommendations:Advised to take 1 extra Furosemide tablet as prescribed x 3 days.  Follow-up plan: ICM clinic phone appointment on8/25/2020 to recheck fluid levels.   Copy of ICM check sent to Dr.Allred and Dr Stanford Breed.   3 month ICM trend: 10/23/2018    1 Year ICM trend:       Rosalene Billings, RN 10/23/2018 11:17 AM

## 2018-10-26 DIAGNOSIS — M546 Pain in thoracic spine: Secondary | ICD-10-CM | POA: Diagnosis not present

## 2018-10-26 DIAGNOSIS — R54 Age-related physical debility: Secondary | ICD-10-CM | POA: Diagnosis not present

## 2018-10-26 DIAGNOSIS — M6281 Muscle weakness (generalized): Secondary | ICD-10-CM | POA: Diagnosis not present

## 2018-10-30 DIAGNOSIS — N2581 Secondary hyperparathyroidism of renal origin: Secondary | ICD-10-CM | POA: Diagnosis not present

## 2018-10-30 DIAGNOSIS — D631 Anemia in chronic kidney disease: Secondary | ICD-10-CM | POA: Diagnosis not present

## 2018-10-30 DIAGNOSIS — N183 Chronic kidney disease, stage 3 (moderate): Secondary | ICD-10-CM | POA: Diagnosis not present

## 2018-10-30 DIAGNOSIS — I129 Hypertensive chronic kidney disease with stage 1 through stage 4 chronic kidney disease, or unspecified chronic kidney disease: Secondary | ICD-10-CM | POA: Diagnosis not present

## 2018-10-30 DIAGNOSIS — N200 Calculus of kidney: Secondary | ICD-10-CM | POA: Diagnosis not present

## 2018-10-30 DIAGNOSIS — I5020 Unspecified systolic (congestive) heart failure: Secondary | ICD-10-CM | POA: Diagnosis not present

## 2018-10-31 ENCOUNTER — Ambulatory Visit (INDEPENDENT_AMBULATORY_CARE_PROVIDER_SITE_OTHER): Payer: Medicare Other

## 2018-10-31 DIAGNOSIS — M6281 Muscle weakness (generalized): Secondary | ICD-10-CM | POA: Diagnosis not present

## 2018-10-31 DIAGNOSIS — R54 Age-related physical debility: Secondary | ICD-10-CM | POA: Diagnosis not present

## 2018-10-31 DIAGNOSIS — Z95810 Presence of automatic (implantable) cardiac defibrillator: Secondary | ICD-10-CM

## 2018-10-31 DIAGNOSIS — M546 Pain in thoracic spine: Secondary | ICD-10-CM | POA: Diagnosis not present

## 2018-10-31 DIAGNOSIS — I509 Heart failure, unspecified: Secondary | ICD-10-CM

## 2018-11-03 ENCOUNTER — Telehealth: Payer: Self-pay

## 2018-11-03 NOTE — Telephone Encounter (Signed)
Remote ICM transmission received.  Attempted call to patient regarding ICM remote transmission and left detailed message, per DPR, to return call.    

## 2018-11-03 NOTE — Progress Notes (Signed)
EPIC Encounter for ICM Monitoring  Patient Name: [name] is a 78 y.o. female Date: 11/03/2018 Primary Care Physican: Velna Hatchet, MD Primary Cardiologist:Crenshaw Electrophysiologist: Allred 11/03/2018 Weight: 126 lbs   Patient returned call and transmission reviewed.  Advised there is a pattern showing of more frequent and long episodes of possible fluid accumulation.  She thinks she follows a low salt diet but after discussing she does not look at food labels and unaware of salt intake.  She has take out food twice a week and eats a lot of cheese and crackers at home and explained those things are high in salt.  Encouraged her to look at food labels and limit salt intake to 2000 mg a day and avoid take out food if possible.   CorVueThoracic impedance remains below baseline suggesting possible fluid accumulation starting7/30/2020 but appears to be trending towards baseline after taking 2 days of extra Lasix last week.   Prescribed:Furosemide 20 mg by mouth daily. Can take an extra 1 tablet daily as needed.  LABS: 12/12/2017 Creatinine 1.61, BUN 26, Potassium 5.1, Sodium 142, GFR 31-36  Recommendations: She will take 1 extra Furosemide tomorrow.  She had extra Furosemide last week x 2 days.   Follow-up plan: ICM clinic phone appointment on9/04/2018 (manual send) to recheck fluid levels.   Copy of ICM check sent to Dr.Allred and Dr Stanford Breed.  3 month ICM trend: 10/31/2018    1 Year ICM trend:       Rosalene Billings, RN 11/03/2018 9:08 AM

## 2018-11-06 ENCOUNTER — Other Ambulatory Visit: Payer: Self-pay | Admitting: Cardiology

## 2018-11-07 DIAGNOSIS — M6281 Muscle weakness (generalized): Secondary | ICD-10-CM | POA: Diagnosis not present

## 2018-11-07 DIAGNOSIS — R54 Age-related physical debility: Secondary | ICD-10-CM | POA: Diagnosis not present

## 2018-11-07 DIAGNOSIS — M546 Pain in thoracic spine: Secondary | ICD-10-CM | POA: Diagnosis not present

## 2018-11-08 ENCOUNTER — Other Ambulatory Visit: Payer: Self-pay

## 2018-11-08 ENCOUNTER — Ambulatory Visit (INDEPENDENT_AMBULATORY_CARE_PROVIDER_SITE_OTHER): Payer: Medicare Other

## 2018-11-08 DIAGNOSIS — Z7901 Long term (current) use of anticoagulants: Secondary | ICD-10-CM | POA: Diagnosis not present

## 2018-11-08 DIAGNOSIS — I059 Rheumatic mitral valve disease, unspecified: Secondary | ICD-10-CM | POA: Diagnosis not present

## 2018-11-08 DIAGNOSIS — I38 Endocarditis, valve unspecified: Secondary | ICD-10-CM

## 2018-11-08 DIAGNOSIS — I4891 Unspecified atrial fibrillation: Secondary | ICD-10-CM

## 2018-11-08 LAB — POCT INR: INR: 1.7 — AB (ref 2.0–3.0)

## 2018-11-08 NOTE — Patient Instructions (Signed)
Description   Take 2 tablets today, then start taking 1.5 tablets daily except 1 tablet on Sundays and Thursdays. Pt will continue drinking protein shake (Glucerna) weekly. Recheck in 2 weeks.  Call if any new medications [phone].

## 2018-11-09 DIAGNOSIS — R54 Age-related physical debility: Secondary | ICD-10-CM | POA: Diagnosis not present

## 2018-11-09 DIAGNOSIS — M6281 Muscle weakness (generalized): Secondary | ICD-10-CM | POA: Diagnosis not present

## 2018-11-09 DIAGNOSIS — M546 Pain in thoracic spine: Secondary | ICD-10-CM | POA: Diagnosis not present

## 2018-11-14 NOTE — Progress Notes (Signed)
No ICM remote transmission received for 10/31/2018 and next ICM transmission scheduled for 12/25/2018.

## 2018-11-15 DIAGNOSIS — R54 Age-related physical debility: Secondary | ICD-10-CM | POA: Diagnosis not present

## 2018-11-15 DIAGNOSIS — M546 Pain in thoracic spine: Secondary | ICD-10-CM | POA: Diagnosis not present

## 2018-11-15 DIAGNOSIS — M6281 Muscle weakness (generalized): Secondary | ICD-10-CM | POA: Diagnosis not present

## 2018-11-20 ENCOUNTER — Other Ambulatory Visit: Payer: Self-pay | Admitting: Cardiology

## 2018-11-20 DIAGNOSIS — I4819 Other persistent atrial fibrillation: Secondary | ICD-10-CM

## 2018-11-22 ENCOUNTER — Other Ambulatory Visit: Payer: Self-pay

## 2018-11-22 ENCOUNTER — Ambulatory Visit (INDEPENDENT_AMBULATORY_CARE_PROVIDER_SITE_OTHER): Payer: Medicare Other | Admitting: *Deleted

## 2018-11-22 DIAGNOSIS — I38 Endocarditis, valve unspecified: Secondary | ICD-10-CM | POA: Diagnosis not present

## 2018-11-22 DIAGNOSIS — I4891 Unspecified atrial fibrillation: Secondary | ICD-10-CM | POA: Diagnosis not present

## 2018-11-22 DIAGNOSIS — I059 Rheumatic mitral valve disease, unspecified: Secondary | ICD-10-CM | POA: Diagnosis not present

## 2018-11-22 DIAGNOSIS — Z7901 Long term (current) use of anticoagulants: Secondary | ICD-10-CM

## 2018-11-22 LAB — POCT INR: INR: 2.4 (ref 2.0–3.0)

## 2018-11-22 NOTE — Patient Instructions (Signed)
Description   Continue taking 1.5 mg daily except for 1 mg on Sundays and Thursdays. Pt will continue drinking protein shake (Glucerna) weekly. Recheck in 3 weeks.  Call if any new medications [phone].

## 2018-12-05 DIAGNOSIS — R197 Diarrhea, unspecified: Secondary | ICD-10-CM | POA: Diagnosis not present

## 2018-12-11 DIAGNOSIS — R197 Diarrhea, unspecified: Secondary | ICD-10-CM | POA: Diagnosis not present

## 2018-12-13 ENCOUNTER — Ambulatory Visit (INDEPENDENT_AMBULATORY_CARE_PROVIDER_SITE_OTHER): Payer: Medicare Other | Admitting: *Deleted

## 2018-12-13 ENCOUNTER — Other Ambulatory Visit: Payer: Self-pay

## 2018-12-13 DIAGNOSIS — I38 Endocarditis, valve unspecified: Secondary | ICD-10-CM

## 2018-12-13 DIAGNOSIS — I4891 Unspecified atrial fibrillation: Secondary | ICD-10-CM

## 2018-12-13 DIAGNOSIS — Z7901 Long term (current) use of anticoagulants: Secondary | ICD-10-CM

## 2018-12-13 DIAGNOSIS — I059 Rheumatic mitral valve disease, unspecified: Secondary | ICD-10-CM | POA: Diagnosis not present

## 2018-12-13 LAB — POCT INR: INR: 5 — AB (ref 2.0–3.0)

## 2018-12-13 NOTE — Patient Instructions (Addendum)
Description   Do not take any Warfarin today and No Warfarin tomorrow and on Friday take 1/2 tablet of your 1mg  tablet then continue taking 1.5 mg daily except for 1 mg on Sundays and Thursdays. Pt will continue drinking protein shake (Glucerna) weekly. Recheck in 1 week.  Call if any new medications [phone].

## 2018-12-20 ENCOUNTER — Other Ambulatory Visit: Payer: Self-pay

## 2018-12-20 ENCOUNTER — Ambulatory Visit (INDEPENDENT_AMBULATORY_CARE_PROVIDER_SITE_OTHER): Payer: Medicare Other | Admitting: *Deleted

## 2018-12-20 DIAGNOSIS — Z7901 Long term (current) use of anticoagulants: Secondary | ICD-10-CM | POA: Diagnosis not present

## 2018-12-20 DIAGNOSIS — I38 Endocarditis, valve unspecified: Secondary | ICD-10-CM | POA: Diagnosis not present

## 2018-12-20 DIAGNOSIS — I4891 Unspecified atrial fibrillation: Secondary | ICD-10-CM

## 2018-12-20 DIAGNOSIS — I059 Rheumatic mitral valve disease, unspecified: Secondary | ICD-10-CM | POA: Diagnosis not present

## 2018-12-20 LAB — POCT INR: INR: 2.3 (ref 2.0–3.0)

## 2018-12-20 NOTE — Patient Instructions (Signed)
Description   Continue taking 1.5 mg daily except for 1 mg on Sundays and Thursdays. Pt will start drinking Ensure three times a week. Recheck in 1 week.  Call if any new medications [phone].

## 2018-12-21 ENCOUNTER — Emergency Department (HOSPITAL_COMMUNITY): Payer: Medicare Other

## 2018-12-21 ENCOUNTER — Inpatient Hospital Stay (HOSPITAL_COMMUNITY)
Admission: EM | Admit: 2018-12-21 | Discharge: 2018-12-27 | DRG: 872 | Disposition: A | Discharge status: Skilled Nursing Facility | Payer: Medicare Other | Attending: Internal Medicine | Admitting: Internal Medicine

## 2018-12-21 ENCOUNTER — Other Ambulatory Visit: Payer: Self-pay

## 2018-12-21 ENCOUNTER — Encounter (HOSPITAL_COMMUNITY): Payer: Self-pay | Admitting: Emergency Medicine

## 2018-12-21 DIAGNOSIS — I4892 Unspecified atrial flutter: Secondary | ICD-10-CM | POA: Diagnosis present

## 2018-12-21 DIAGNOSIS — E860 Dehydration: Secondary | ICD-10-CM | POA: Diagnosis present

## 2018-12-21 DIAGNOSIS — E46 Unspecified protein-calorie malnutrition: Secondary | ICD-10-CM | POA: Diagnosis present

## 2018-12-21 DIAGNOSIS — R2681 Unsteadiness on feet: Secondary | ICD-10-CM | POA: Diagnosis not present

## 2018-12-21 DIAGNOSIS — R293 Abnormal posture: Secondary | ICD-10-CM | POA: Diagnosis not present

## 2018-12-21 DIAGNOSIS — K219 Gastro-esophageal reflux disease without esophagitis: Secondary | ICD-10-CM | POA: Diagnosis present

## 2018-12-21 DIAGNOSIS — I959 Hypotension, unspecified: Secondary | ICD-10-CM | POA: Diagnosis present

## 2018-12-21 DIAGNOSIS — K5792 Diverticulitis of intestine, part unspecified, without perforation or abscess without bleeding: Secondary | ICD-10-CM | POA: Diagnosis not present

## 2018-12-21 DIAGNOSIS — Z7401 Bed confinement status: Secondary | ICD-10-CM | POA: Diagnosis not present

## 2018-12-21 DIAGNOSIS — D631 Anemia in chronic kidney disease: Secondary | ICD-10-CM | POA: Diagnosis present

## 2018-12-21 DIAGNOSIS — N1330 Unspecified hydronephrosis: Secondary | ICD-10-CM

## 2018-12-21 DIAGNOSIS — F329 Major depressive disorder, single episode, unspecified: Secondary | ICD-10-CM | POA: Diagnosis present

## 2018-12-21 DIAGNOSIS — I10 Essential (primary) hypertension: Secondary | ICD-10-CM | POA: Diagnosis present

## 2018-12-21 DIAGNOSIS — Z87891 Personal history of nicotine dependence: Secondary | ICD-10-CM

## 2018-12-21 DIAGNOSIS — Z95810 Presence of automatic (implantable) cardiac defibrillator: Secondary | ICD-10-CM

## 2018-12-21 DIAGNOSIS — Z79899 Other long term (current) drug therapy: Secondary | ICD-10-CM

## 2018-12-21 DIAGNOSIS — R531 Weakness: Secondary | ICD-10-CM | POA: Diagnosis not present

## 2018-12-21 DIAGNOSIS — N200 Calculus of kidney: Secondary | ICD-10-CM | POA: Diagnosis not present

## 2018-12-21 DIAGNOSIS — Z23 Encounter for immunization: Secondary | ICD-10-CM | POA: Diagnosis not present

## 2018-12-21 DIAGNOSIS — E1122 Type 2 diabetes mellitus with diabetic chronic kidney disease: Secondary | ICD-10-CM | POA: Diagnosis present

## 2018-12-21 DIAGNOSIS — R2689 Other abnormalities of gait and mobility: Secondary | ICD-10-CM | POA: Diagnosis not present

## 2018-12-21 DIAGNOSIS — M6281 Muscle weakness (generalized): Secondary | ICD-10-CM | POA: Diagnosis not present

## 2018-12-21 DIAGNOSIS — N183 Chronic kidney disease, stage 3 unspecified: Secondary | ICD-10-CM | POA: Diagnosis present

## 2018-12-21 DIAGNOSIS — Z8674 Personal history of sudden cardiac arrest: Secondary | ICD-10-CM | POA: Diagnosis not present

## 2018-12-21 DIAGNOSIS — I4891 Unspecified atrial fibrillation: Secondary | ICD-10-CM | POA: Diagnosis not present

## 2018-12-21 DIAGNOSIS — I5022 Chronic systolic (congestive) heart failure: Secondary | ICD-10-CM | POA: Diagnosis present

## 2018-12-21 DIAGNOSIS — Z951 Presence of aortocoronary bypass graft: Secondary | ICD-10-CM

## 2018-12-21 DIAGNOSIS — N1832 Chronic kidney disease, stage 3b: Secondary | ICD-10-CM | POA: Diagnosis not present

## 2018-12-21 DIAGNOSIS — Z681 Body mass index (BMI) 19 or less, adult: Secondary | ICD-10-CM

## 2018-12-21 DIAGNOSIS — I2510 Atherosclerotic heart disease of native coronary artery without angina pectoris: Secondary | ICD-10-CM | POA: Diagnosis present

## 2018-12-21 DIAGNOSIS — Z87442 Personal history of urinary calculi: Secondary | ICD-10-CM

## 2018-12-21 DIAGNOSIS — R0902 Hypoxemia: Secondary | ICD-10-CM | POA: Diagnosis not present

## 2018-12-21 DIAGNOSIS — M810 Age-related osteoporosis without current pathological fracture: Secondary | ICD-10-CM | POA: Diagnosis present

## 2018-12-21 DIAGNOSIS — Z20828 Contact with and (suspected) exposure to other viral communicable diseases: Secondary | ICD-10-CM | POA: Diagnosis present

## 2018-12-21 DIAGNOSIS — R410 Disorientation, unspecified: Secondary | ICD-10-CM | POA: Diagnosis not present

## 2018-12-21 DIAGNOSIS — I480 Paroxysmal atrial fibrillation: Secondary | ICD-10-CM | POA: Diagnosis present

## 2018-12-21 DIAGNOSIS — R1312 Dysphagia, oropharyngeal phase: Secondary | ICD-10-CM | POA: Diagnosis not present

## 2018-12-21 DIAGNOSIS — N132 Hydronephrosis with renal and ureteral calculous obstruction: Secondary | ICD-10-CM | POA: Diagnosis present

## 2018-12-21 DIAGNOSIS — Z7901 Long term (current) use of anticoagulants: Secondary | ICD-10-CM

## 2018-12-21 DIAGNOSIS — Z8249 Family history of ischemic heart disease and other diseases of the circulatory system: Secondary | ICD-10-CM

## 2018-12-21 DIAGNOSIS — K5732 Diverticulitis of large intestine without perforation or abscess without bleeding: Secondary | ICD-10-CM | POA: Diagnosis present

## 2018-12-21 DIAGNOSIS — Z833 Family history of diabetes mellitus: Secondary | ICD-10-CM

## 2018-12-21 DIAGNOSIS — Z825 Family history of asthma and other chronic lower respiratory diseases: Secondary | ICD-10-CM

## 2018-12-21 DIAGNOSIS — Z803 Family history of malignant neoplasm of breast: Secondary | ICD-10-CM

## 2018-12-21 DIAGNOSIS — R278 Other lack of coordination: Secondary | ICD-10-CM | POA: Diagnosis not present

## 2018-12-21 DIAGNOSIS — I255 Ischemic cardiomyopathy: Secondary | ICD-10-CM | POA: Diagnosis present

## 2018-12-21 DIAGNOSIS — I130 Hypertensive heart and chronic kidney disease with heart failure and stage 1 through stage 4 chronic kidney disease, or unspecified chronic kidney disease: Secondary | ICD-10-CM | POA: Diagnosis present

## 2018-12-21 DIAGNOSIS — A419 Sepsis, unspecified organism: Secondary | ICD-10-CM | POA: Diagnosis not present

## 2018-12-21 DIAGNOSIS — Z8673 Personal history of transient ischemic attack (TIA), and cerebral infarction without residual deficits: Secondary | ICD-10-CM

## 2018-12-21 DIAGNOSIS — Z86002 Personal history of in-situ neoplasm of other and unspecified genital organs: Secondary | ICD-10-CM

## 2018-12-21 DIAGNOSIS — E1129 Type 2 diabetes mellitus with other diabetic kidney complication: Secondary | ICD-10-CM | POA: Diagnosis not present

## 2018-12-21 DIAGNOSIS — R41841 Cognitive communication deficit: Secondary | ICD-10-CM | POA: Diagnosis not present

## 2018-12-21 DIAGNOSIS — Z888 Allergy status to other drugs, medicaments and biological substances status: Secondary | ICD-10-CM

## 2018-12-21 DIAGNOSIS — Z800 Family history of malignant neoplasm of digestive organs: Secondary | ICD-10-CM

## 2018-12-21 DIAGNOSIS — M2550 Pain in unspecified joint: Secondary | ICD-10-CM | POA: Diagnosis not present

## 2018-12-21 DIAGNOSIS — R5383 Other fatigue: Secondary | ICD-10-CM | POA: Diagnosis present

## 2018-12-21 DIAGNOSIS — Z952 Presence of prosthetic heart valve: Secondary | ICD-10-CM

## 2018-12-21 LAB — CBC WITH DIFFERENTIAL/PLATELET
Abs Immature Granulocytes: 0.05 10*3/uL (ref 0.00–0.07)
Basophils Absolute: 0 10*3/uL (ref 0.0–0.1)
Basophils Relative: 0 %
Eosinophils Absolute: 0.1 10*3/uL (ref 0.0–0.5)
Eosinophils Relative: 1 %
HCT: 37.9 % (ref 36.0–46.0)
Hemoglobin: 11.8 g/dL — ABNORMAL LOW (ref 12.0–15.0)
Immature Granulocytes: 0 %
Lymphocytes Relative: 3 %
Lymphs Abs: 0.3 10*3/uL — ABNORMAL LOW (ref 0.7–4.0)
MCH: 31.9 pg (ref 26.0–34.0)
MCHC: 31.1 g/dL (ref 30.0–36.0)
MCV: 102.4 fL — ABNORMAL HIGH (ref 80.0–100.0)
Monocytes Absolute: 0.6 10*3/uL (ref 0.1–1.0)
Monocytes Relative: 5 %
Neutro Abs: 11 10*3/uL — ABNORMAL HIGH (ref 1.7–7.7)
Neutrophils Relative %: 91 %
Platelets: 323 10*3/uL (ref 150–400)
RBC: 3.7 MIL/uL — ABNORMAL LOW (ref 3.87–5.11)
RDW: 14.8 % (ref 11.5–15.5)
WBC: 12 10*3/uL — ABNORMAL HIGH (ref 4.0–10.5)
nRBC: 0 % (ref 0.0–0.2)

## 2018-12-21 LAB — COMPREHENSIVE METABOLIC PANEL
ALT: 7 U/L (ref 0–44)
AST: 15 U/L (ref 15–41)
Albumin: 3.1 g/dL — ABNORMAL LOW (ref 3.5–5.0)
Alkaline Phosphatase: 29 U/L — ABNORMAL LOW (ref 38–126)
Anion gap: 10 (ref 5–15)
BUN: 30 mg/dL — ABNORMAL HIGH (ref 8–23)
CO2: 27 mmol/L (ref 22–32)
Calcium: 9.1 mg/dL (ref 8.9–10.3)
Chloride: 104 mmol/L (ref 98–111)
Creatinine, Ser: 1.52 mg/dL — ABNORMAL HIGH (ref 0.44–1.00)
GFR calc Af Amer: 38 mL/min — ABNORMAL LOW (ref >60)
GFR calc non Af Amer: 33 mL/min — ABNORMAL LOW (ref >60)
Glucose, Bld: 181 mg/dL — ABNORMAL HIGH (ref 70–99)
Potassium: 4.6 mmol/L (ref 3.5–5.1)
Sodium: 141 mmol/L (ref 135–145)
Total Bilirubin: 1.1 mg/dL (ref 0.3–1.2)
Total Protein: 6 g/dL — ABNORMAL LOW (ref 6.5–8.1)

## 2018-12-21 LAB — HEMOGLOBIN A1C
Hgb A1c MFr Bld: 5.9 % — ABNORMAL HIGH (ref 4.8–5.6)
Mean Plasma Glucose: 122.63 mg/dL

## 2018-12-21 LAB — PROTIME-INR
INR: 2.4 — ABNORMAL HIGH (ref 0.8–1.2)
Prothrombin Time: 25.5 seconds — ABNORMAL HIGH (ref 11.4–15.2)

## 2018-12-21 LAB — TROPONIN I (HIGH SENSITIVITY)
Troponin I (High Sensitivity): 7 ng/L (ref <18)
Troponin I (High Sensitivity): 9 ng/L (ref <18)

## 2018-12-21 LAB — GLUCOSE, CAPILLARY: Glucose-Capillary: 101 mg/dL — ABNORMAL HIGH (ref 70–99)

## 2018-12-21 LAB — SARS CORONAVIRUS 2 BY RT PCR (HOSPITAL ORDER, PERFORMED IN ~~LOC~~ HOSPITAL LAB): SARS Coronavirus 2: NEGATIVE

## 2018-12-21 MED ORDER — WARFARIN SODIUM 1 MG PO TABS
1.0000 mg | ORAL_TABLET | Freq: Once | ORAL | Status: AC
  Administered 2018-12-21: 1 mg via ORAL
  Filled 2018-12-21: qty 1

## 2018-12-21 MED ORDER — MIRTAZAPINE 15 MG PO TABS: 15.0000 mg | ORAL_TABLET | Freq: Every day | ORAL | Status: DC

## 2018-12-21 MED ORDER — AMIODARONE HCL 100 MG PO TABS
100.0000 mg | ORAL_TABLET | Freq: Every day | ORAL | Status: DC
  Administered 2018-12-21 – 2018-12-27 (×7): 100 mg via ORAL
  Filled 2018-12-21 (×7): qty 1

## 2018-12-21 MED ORDER — MORPHINE SULFATE (PF) 2 MG/ML IV SOLN: 1.0000 mg | INTRAVENOUS | Status: DC | PRN

## 2018-12-21 MED ORDER — ONDANSETRON HCL 4 MG PO TABS: 4.0000 mg | ORAL_TABLET | Freq: Four times a day (QID) | ORAL | Status: DC | PRN

## 2018-12-21 MED ORDER — ACETAMINOPHEN 650 MG RE SUPP: 650.0000 mg | Freq: Four times a day (QID) | RECTAL | Status: DC | PRN

## 2018-12-21 MED ORDER — PIPERACILLIN-TAZOBACTAM 3.375 G IVPB 30 MIN: 3.3750 g | Freq: Four times a day (QID) | INTRAVENOUS | Status: DC

## 2018-12-21 MED ORDER — SODIUM CHLORIDE 0.9 % IV BOLUS
500.0000 mL | Freq: Once | INTRAVENOUS | Status: AC
  Administered 2018-12-21: 500 mL via INTRAVENOUS

## 2018-12-21 MED ORDER — ACETAMINOPHEN 325 MG PO TABS
650.0000 mg | ORAL_TABLET | Freq: Four times a day (QID) | ORAL | Status: DC | PRN
  Filled 2018-12-21: qty 2

## 2018-12-21 MED ORDER — INSULIN ASPART 100 UNIT/ML ~~LOC~~ SOLN
0.0000 [IU] | Freq: Three times a day (TID) | SUBCUTANEOUS | Status: DC
  Administered 2018-12-23 – 2018-12-25 (×5): 1 [IU] via SUBCUTANEOUS
  Administered 2018-12-26: 2 [IU] via SUBCUTANEOUS
  Administered 2018-12-26: 3 [IU] via SUBCUTANEOUS
  Administered 2018-12-26: 1 [IU] via SUBCUTANEOUS
  Administered 2018-12-27: 2 [IU] via SUBCUTANEOUS
  Administered 2018-12-27: 3 [IU] via SUBCUTANEOUS

## 2018-12-21 MED ORDER — SODIUM CHLORIDE 0.9 % IV BOLUS
500.0000 mL | Freq: Once | INTRAVENOUS | Status: AC
  Administered 2018-12-21: 11:00:00 500 mL via INTRAVENOUS

## 2018-12-21 MED ORDER — PIPERACILLIN-TAZOBACTAM 3.375 G IVPB
3.3750 g | Freq: Three times a day (TID) | INTRAVENOUS | Status: DC
  Administered 2018-12-21 – 2018-12-26 (×14): 3.375 g via INTRAVENOUS
  Filled 2018-12-21 (×13): qty 50

## 2018-12-21 MED ORDER — LACTATED RINGERS IV SOLN
INTRAVENOUS | Status: DC
  Administered 2018-12-21 – 2018-12-22 (×2): via INTRAVENOUS

## 2018-12-21 MED ORDER — VITAMIN B-12 1000 MCG PO TABS
2500.0000 ug | ORAL_TABLET | ORAL | Status: DC
  Administered 2018-12-22 – 2018-12-27 (×3): 2500 ug via ORAL
  Filled 2018-12-21 (×4): qty 3

## 2018-12-21 MED ORDER — HEPARIN SODIUM (PORCINE) 5000 UNIT/ML IJ SOLN
5000.0000 [IU] | Freq: Two times a day (BID) | INTRAMUSCULAR | Status: DC
  Administered 2018-12-21 – 2018-12-22 (×2): 5000 [IU] via SUBCUTANEOUS
  Filled 2018-12-21 (×2): qty 1

## 2018-12-21 MED ORDER — WARFARIN - PHARMACIST DOSING INPATIENT: Freq: Every day | Status: DC

## 2018-12-21 MED ORDER — CARVEDILOL 12.5 MG PO TABS
12.5000 mg | ORAL_TABLET | Freq: Two times a day (BID) | ORAL | Status: DC
  Administered 2018-12-22 – 2018-12-27 (×11): 12.5 mg via ORAL
  Filled 2018-12-21 (×11): qty 1

## 2018-12-21 MED ORDER — ONDANSETRON HCL 4 MG/2ML IJ SOLN: 4.0000 mg | Freq: Four times a day (QID) | INTRAMUSCULAR | Status: DC | PRN

## 2018-12-21 MED ORDER — VITAMIN B-12 2500 MCG SL SUBL: 1.0000 | SUBLINGUAL_TABLET | SUBLINGUAL | Status: DC

## 2018-12-21 MED ORDER — PIPERACILLIN-TAZOBACTAM 3.375 G IVPB
3.3750 g | Freq: Once | INTRAVENOUS | Status: AC
  Administered 2018-12-21: 3.375 g via INTRAVENOUS
  Filled 2018-12-21: qty 50

## 2018-12-21 NOTE — Progress Notes (Signed)
[place] for warfarin Indication: Afib, mechanical aortic valve  Allergies  Allergen Reactions  . Metformin And Related     Renal issues  . Tikosyn [Dofetilide]     Passed out  . Caffeine Other (See Comments)    Affects heart    Patient Measurements:    Vital Signs: Temp: 97.9 F (36.6 C) (10/15 0948) Temp Source: Oral (10/15 0948) BP: 113/71 (10/15 1501) Pulse Rate: 117 (10/15 1501)  Labs: Recent Labs    12/20/18 1332 12/21/18 1102 12/21/18 1349  HGB  --  11.8*  --   HCT  --  37.9  --   PLT  --  323  --   LABPROT  --  25.5*  --   INR 2.3 2.4*  --   CREATININE  --  1.52*  --   TROPONINIHS  --  7 9    CrCl cannot be calculated (Unknown ideal weight.).   Medical History: Past Medical History:  Diagnosis Date  . AICD (automatic cardioverter/defibrillator) present    2014  . Anemia   . Arthritis    OSTEO  . Atrial fibrillation [facility])    A.fib/flutter: s/p MAZE 2008, DCCV 2011, on coumadin; previously on flecainide, but dc'd due to worsening EF. Tikosyn discontinued 06/2012 after cardiac arrest.  . Carcinoma in situ of vulva   . Chronic systolic CHF (congestive heart failure) (HCC)    EF 35%  . Coronary artery disease    Occluded LM 2/2 previous aortic root surgery; s/p 2v CABG 2010 LIMA to LAD, SVG to OM; Cath 03/11/12 patent SVG to OM, atretic LIMA to LAD, patent RCA, occluded native LM, EF 35%   . CVA (cerebral vascular accident) ([place])    2008 - felt due to oscillating calcium on aortic valve  . Diabetes mellitus   . Diverticulitis   . Dysrhythmia   . Fibroid   . Fracture of lumbar spine ([place])   . GERD (gastroesophageal reflux disease)   . Hypertension   . Ischemic cardiomyopathy    EF 35%  . Kidney stones   . Mitral regurgitation    s/p mitral valve repair 2008  . Osteoporosis   . Ovarian cyst, left 2008-2009  . Severe aortic stenosis    s/p homograft aortic root replacement 2008  . Stroke ([place])   .  Superficial venous thrombosis of upper extremity    06/2012 - LUE  . Thoracic aortic aneurysm [facility])    s/p resection and grafting 2008  . Ventricular fibrillation ([place])    VF arrest/VT/torsades 06/2012 with prolonged hosp with VDRF, cardiogen shock, asp PNA, encephalopathy, anemia, shock liver, hypernatremia - s/p ICD implantation 06/08/12.    Medications:  (Not in a hospital admission)  Scheduled:    Assessment: 57 yoF with PMH Afib and aortic valve replacement on warfarin PTA, HFrEF (35%) s/p AICD, DM2, HTN, diverticulosis, anemia, presents with diarrhea and abdominal pain. Recently treated for diverticulitis with 2 courses of abx; current CT shows persistent diverticulitis of giant colonic diverticulum. Admitted for IV abx; pharmacy consulted to resume warfarin while admitted.   Baseline INR therapeutic  Prior anticoagulation: warfarin 1.5 mg daily except 1 mg Thurs & Sun, LD 10/14  Significant events:  Today, 12/21/2018:  CBC: Hgb slightly low but consistent with baseline (12-13g)  INR therapeutic; no missed doses of warfarin - I expect acute illness and abx will result in increased warfarin sensitivity this admission  Major drug interactions: amiodarone PTA (chronic med); broad-spectrum abx  No bleeding issues per nursing  Diet not ordered yet; may be made NPO if needing invasive procedure for diverticulitis  Goal of Therapy: INR 2-3  Plan:  Warfarin 1 mg PO tonight at 18:00  Daily INR  CBC at least q72 hr while on warfarin  Monitor for signs of bleeding or thrombosis   Reuel Boom, PharmD, BCPS [phone] 12/21/2018, 4:17 PM

## 2018-12-21 NOTE — ED Notes (Signed)
Pure wick has been placed. Suction set to 45mmHg.  

## 2018-12-21 NOTE — H&P (Addendum)
History and Physical    [name] YYT:[id number] DOB: 04-Aug-1940 DOA: 12/21/2018  PCP: Velna Hatchet, MD   Patient coming from: Home   Chief Complaint: Abdominal pain.   HPI: [name] is a 78 y.o. female with medical history significant of ischemic cardiomyopathy with ejection fraction 35% status post AICD 2014, atrial fibrillation status post maze 2008, type 2 diabetes mellitus, hypertension, diverticulosis, arthritis and anemia.  Patient has been not feeling well for the past 3 weeks, consistent with diarrhea and abdominal pain.  She was diagnosed as an outpatient with diverticulitis, and she had 2 rounds of antibiotics, one course of 7 days and the other for about 14 days with no improvement of her symptoms.  At home she continued to have severe diarrhea, multiple times per day, watery, associated with poor oral intake, generalized weakness. No improving or worsening factors.  Today it reached to the point where she was not unable to stand, and required significant assistance for mobility.  Her husband brought her to the hospital for further evaluation. Patient has been self quarantine and has not been in contact with other people beside her hospital   ED Course: Patient was found ill looking appearing, very deconditioned and dehydrated, her creatinine was 1.52 and her white cell count 12.0.  Abdominal CT showed diverticulitis of a giant colonic diverticulum   Review of Systems:  1. General: No fevers, no chills, positive weight loss, and generalized weakness.  2. ENT: No runny nose or sore throat, no hearing disturbances 3. Pulmonary: No dyspnea, cough, wheezing, or hemoptysis 4. Cardiovascular: No angina, claudication, lower extremity edema, pnd or orthopnea 5. Gastrointestinal: No nausea or vomiting, positive for watery diarrhea.  6. Hematology: No easy bruisability or frequent infections 7. Urology: No dysuria, hematuria or increased urinary frequency 8. Dermatology: No  rashes. 9. Neurology: No seizures or paresthesias 10. Musculoskeletal: No joint pain or deformities  Past Medical History:  Diagnosis Date   AICD (automatic cardioverter/defibrillator) present    2014   Anemia    Arthritis    OSTEO   Atrial fibrillation ([place])    A.fib/flutter: s/p MAZE 2008, DCCV 2011, on coumadin; previously on flecainide, but dc'd due to worsening EF. Tikosyn discontinued 06/2012 after cardiac arrest.   Carcinoma in situ of vulva    Chronic systolic CHF (congestive heart failure) (HCC)    EF 35%   Coronary artery disease    Occluded LM 2/2 previous aortic root surgery; s/p 2v CABG 2010 LIMA to LAD, SVG to OM; Cath 03/11/12 patent SVG to OM, atretic LIMA to LAD, patent RCA, occluded native LM, EF 35%    CVA (cerebral vascular accident) ([place])    2008 - felt due to oscillating calcium on aortic valve   Diabetes mellitus    Diverticulitis    Dysrhythmia    Fibroid    Fracture of lumbar spine (HCC)    GERD (gastroesophageal reflux disease)    Hypertension    Ischemic cardiomyopathy    EF 35%   Kidney stones    Mitral regurgitation    s/p mitral valve repair 2008   Osteoporosis    Ovarian cyst, left 2008-2009   Severe aortic stenosis    s/p homograft aortic root replacement 2008   Stroke [facility])    Superficial venous thrombosis of upper extremity    06/2012 - LUE   Thoracic aortic aneurysm [facility])    s/p resection and grafting 2008   Ventricular fibrillation (HCC)    VF arrest/VT/torsades  06/2012 with prolonged hosp with VDRF, cardiogen shock, asp PNA, encephalopathy, anemia, shock liver, hypernatremia - s/p ICD implantation 06/08/12.    Past Surgical History:  Procedure Laterality Date   aneurysm and left sided maze  2008   AORTIC ROOT REPLACEMENT  2008   homograft   CARDIAC DEFIBRILLATOR PLACEMENT  06/08/2012   [employer] [address] DR  implanted by Dr Rayann Heman following a cardiac arrest   CATARACT EXTRACTION     CORONARY  ARTERY BYPASS GRAFT  4/10   LIMA to LAD, SVG to OM   EYE SURGERY     FOOT SURGERY Left    removed neuroma   IMPLANTABLE CARDIOVERTER DEFIBRILLATOR IMPLANT N/A 06/08/2012   Procedure: IMPLANTABLE CARDIOVERTER DEFIBRILLATOR IMPLANT;  Surgeon: Thompson Grayer, MD;  Location: [facility] CATH LAB;  Service: Cardiovascular;  Laterality: N/A;   KYPHOPLASTY N/A 12/11/2014   Procedure: KYPHOPLASTY T-12;  Surgeon: Karie Chimera, MD;  Location: [facility] NEURO ORS;  Service: Neurosurgery;  Laterality: N/A;  KYPHOPLASTY T-12   MITRAL VALVE REPLACEMENT  2008   due to severe MR   Resection and grafting of ascending thoracic aortic     RIGHT HEART CATHETERIZATION N/A 04/03/2012   Procedure: RIGHT HEART CATH;  Surgeon: [name], MD;  Location: [facility] CATH LAB;  Service: Cardiovascular;  Laterality: N/A;   WIDE EXCISION OF VULVA     CA INSITU     reports that she quit smoking about 35 years ago. Her smoking use included cigarettes. She has a 5.00 pack-year smoking history. She has never used smokeless tobacco. She reports that she does not drink alcohol or use drugs.  Allergies  Allergen Reactions   Metformin And Related     Renal issues   Tikosyn [Dofetilide]     Passed out   Caffeine Other (See Comments)    Affects heart    Family History  Problem Relation Age of Onset   Hypertension Mother    Breast cancer Maternal Aunt        age 41's   Colon cancer Paternal Grandmother    Diabetes Maternal Uncle    Diabetes Paternal Uncle    Emphysema Father      Prior to Admission medications   Medication Sig Start Date End Date Taking? Authorizing Provider  carvedilol (COREG) 12.5 MG tablet TAKE 1 TABLET BY MOUTH TWICE A DAY WITH A MEAL 11/20/18   Lelon Perla, MD  Cyanocobalamin (VITAMIN B-12) 2500 MCG SUBL Place 1 tablet under the tongue every Monday, Wednesday, and Friday.     [provider]  ergocalciferol (VITAMIN D2) 50000 UNITS capsule Take 50,000 Units by mouth once a week.  Monday    [provider]  furosemide (LASIX) 20 MG tablet TAKE ONE TABLET ONCE DAILY AND TAKE 1 EXTRA AS NEEDED 07/07/18   Lelon Perla, MD  losartan (COZAAR) 100 MG tablet Take 100 mg by mouth daily.    [provider]  losartan (COZAAR) 25 MG tablet Take 25 mg by mouth 2 (two) times daily. 11/16/18   [provider]  losartan (COZAAR) 50 MG tablet TAKE 1 TABLET (50 MG TOTAL) BY MOUTH DAILY. 05/03/18   Lelon Perla, MD  metroNIDAZOLE (FLAGYL) 500 MG tablet Take 500 mg by mouth 3 (three) times daily. 10 day supply 12/05/18   [provider]  mirtazapine (REMERON) 15 MG tablet Take 15 mg by mouth daily. 09/11/18   [provider]  NON FORMULARY ([facility]) Take 500 mg of elemental calcium  by mouth at bedtime    [provider]  PACERONE 200 MG tablet TAKE 1/2 TABLET BY MOUTH DAILY. 08/10/18   Allred, Jeneen Rinks, MD  Psyllium (METAMUCIL PO) Take 1 teaspoon by mouth daily with water    [provider]  warfarin (COUMADIN) 1 MG tablet Take as directed by Coumadin Clinic 07/05/18   Lelon Perla, MD  warfarin (COUMADIN) 3 MG tablet TAKE 1/2 TO 1 TABLET BY MOUTH AS DIRECTED BY COUMADIN CLINIC 11/06/18   Lelon Perla, MD    Physical Exam: Vitals:   12/21/18 1100 12/21/18 1130 12/21/18 1200 12/21/18 1300  BP: 121/84 123/67 131/65 132/73  Pulse: 67 67 63 66  Resp: (!) 22 (!) 21 (!) 24 13  Temp:      TempSrc:      SpO2: 98% 100% 98% 95%    Vitals:   12/21/18 1100 12/21/18 1130 12/21/18 1200 12/21/18 1300  BP: 121/84 123/67 131/65 132/73  Pulse: 67 67 63 66  Resp: (!) 22 (!) 21 (!) 24 13  Temp:      TempSrc:      SpO2: 98% 100% 98% 95%   General: Not in pain or dyspnea, deconditioned and ill looking appearing  Neurology: Awake and alert, non focal Head and Neck. Head normocephalic. Neck supple with no adenopathy or thyromegaly.   E ENT: positive pallor, no icterus, oral mucosa moist Cardiovascular: No JVD. S1-S2 present,  rhythmic, no gallops, rubs, or murmurs. No lower extremity edema. Pulmonary: vesicular breath sounds bilaterally, adequate air movement, no wheezing, rhonchi or rales. Gastrointestinal. Abdomen distended, tender to palpation, tympanic to percussion, with no organomegaly.  Skin. No rashes Musculoskeletal: no joint deformities    Labs on Admission: I have personally reviewed following labs and imaging studies  CBC: Recent Labs  Lab 12/21/18 1102  WBC 12.0*  NEUTROABS 11.0*  HGB 11.8*  HCT 37.9  MCV 102.4*  PLT 656   Basic Metabolic Panel: Recent Labs  Lab 12/21/18 1102  NA 141  K 4.6  CL 104  CO2 27  GLUCOSE 181*  BUN 30*  CREATININE 1.52*  CALCIUM 9.1   GFR: CrCl cannot be calculated (Unknown ideal weight.). Liver Function Tests: Recent Labs  Lab 12/21/18 1102  AST 15  ALT 7  ALKPHOS 29*  BILITOT 1.1  PROT 6.0*  ALBUMIN 3.1*   No results for input(s): LIPASE, AMYLASE in the last 168 hours. No results for input(s): AMMONIA in the last 168 hours. Coagulation Profile: Recent Labs  Lab 12/20/18 1332 12/21/18 1102  INR 2.3 2.4*   Cardiac Enzymes: No results for input(s): CKTOTAL, CKMB, CKMBINDEX, TROPONINI in the last 168 hours. BNP (last 3 results) No results for input(s): PROBNP in the last 8760 hours. HbA1C: No results for input(s): HGBA1C in the last 72 hours. CBG: No results for input(s): GLUCAP in the last 168 hours. Lipid Profile: No results for input(s): CHOL, HDL, LDLCALC, TRIG, CHOLHDL, LDLDIRECT in the last 72 hours. Thyroid Function Tests: No results for input(s): TSH, T4TOTAL, FREET4, T3FREE, THYROIDAB in the last 72 hours. Anemia Panel: No results for input(s): VITAMINB12, FOLATE, FERRITIN, TIBC, IRON, RETICCTPCT in the last 72 hours. Urine analysis:    Component Value Date/Time   COLORURINE AMBER (A) 11/11/2014 1200   APPEARANCEUR TURBID (A) 11/11/2014 1200   LABSPEC 1.013 11/11/2014 1200   PHURINE 6.0 11/11/2014 1200   GLUCOSEU  NEGATIVE 11/11/2014 1200   GLUCOSEU NEGATIVE 03/14/2012 1142   HGBUR LARGE (A) 11/11/2014 1200   HGBUR  negative 11/26/2008 [place] 11/11/2014 1200   BILIRUBINUR negative 07/21/2012 0909   KETONESUR NEGATIVE 11/11/2014 1200   PROTEINUR 30 (A) 11/11/2014 1200   UROBILINOGEN 0.2 11/11/2014 1200   NITRITE NEGATIVE 11/11/2014 1200   LEUKOCYTESUR MODERATE (A) 11/11/2014 1200    Radiological Exams on Admission: Dg Chest Port 1 View  Result Date: 12/21/2018 CLINICAL DATA:  Weakness EXAM: PORTABLE CHEST 1 VIEW COMPARISON:  11/28/2017 FINDINGS: Left AICD remains in place, unchanged. Prior median sternotomy and valve replacement. Cardiomegaly. Prominence of the right hilum could be vascular although adenopathy cannot be excluded. No confluent opacities, effusions or edema. No acute bony abnormality. IMPRESSION: Cardiomegaly. Prominence of the right hilum felt to most likely be related to vascular congestion although adenopathy cannot be completely excluded. This could be further evaluated with chest CT with IV contrast if felt clinically indicated. Electronically Signed   By: Rolm Baptise M.D.   On: 12/21/2018 10:20   Ct Renal Stone Study  Result Date: 12/21/2018 CLINICAL DATA:  Weakness, flank pain EXAM: CT ABDOMEN AND PELVIS WITHOUT CONTRAST TECHNIQUE: Multidetector CT imaging of the abdomen and pelvis was performed following the standard protocol without IV contrast. COMPARISON:  03/31/2018 FINDINGS: Lower chest: Cardiomegaly. Lung bases clear. Hepatobiliary: Liver is within normal limits. Numerous layering stones within the gallbladder lumen. No biliary dilatation. Pancreas: Unremarkable. No pancreatic ductal dilatation or surrounding inflammatory changes. Spleen: No acute abnormality. Adrenals/Urinary Tract: Thickened appearance of the left adrenal gland is unchanged. Right adrenal gland is normal. Multiple large renal calculi bilaterally. Largest on the right measures 1.6 x 0.9  cm. Calculus within the left renal pelvis measures 1.8 x 1.0 cm. Mild-to-moderate left-sided hydronephrosis. Bilateral ureters are nondilated. 5 mm stone at the left ureterovesical junction. Urinary bladder is grossly unremarkable. 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 There is a large rounded air-filled structure adjacent to the sigmoid colon measuring 7.8 x 7.3 cm with mildly thickened wall, new from prior suggestive of a giant colonic diverticulum (series 3, image 50). There is moderate free fluid adjacent to this diverticulum. No well-defined fluid collection to suggest abscess. No evidence of perforation. Small hiatal hernia. Stomach and small bowel otherwise unremarkable. No dilated small bowel. Vascular/Lymphatic: No significant vascular findings are present. No enlarged abdominal or pelvic lymph nodes. Reproductive: Anteverted uterus with multiple coarsely calcified fibroids. No adnexal masses. Other: No abdominal wall hernia. Musculoskeletal: Multiple prior vertebral body compression deformities including T11, T12, L1, L3, and L4. No new or worsening vertebral body compression fracture compared to 03/31/2018. No acute osseous findings. IMPRESSION: 1. 5 mm left UVJ stone with mild-to-moderate left-sided hydroureteronephrosis. Multiple additional bilateral nonobstructing renal calculi. 2. New 7.8 x 7.3 cm rounded air-filled structure immediately adjacent to the sigmoid colon with mildly thickened wall and adjacent free fluid. 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 No abscess or evidence of perforation. 3. Uncomplicated cholelithiasis. 4. Fibroid uterus. 5. Multiple prior vertebral body compression deformities. No new or worsening vertebral body compression fracture compared to prior. These results were called by telephone at the time of interpretation on 12/21/2018 at 2:14 pm to provider Veryl Speak , who verbally acknowledged these  results. Electronically Signed   By: Davina Poke M.D.   On: 12/21/2018 14:16    EKG: NA  Assessment/Plan Principal Problem:   Sepsis ([employer]) Active Problems:   Essential hypertension, benign   Atrial fibrillation (HCC)   GERD (gastroesophageal reflux disease)   Kidney disease, chronic, stage III (  GFR 30-59 ml/min)   Diverticulitis   78 year old female with cardiovascular disease and type 2 diabetes mellitus who presents with 3-week history of diarrhea, abdominal pain, generalized weakness and decreased p.o. intake.  She failed outpatient therapy with oral antibiotics.  On her initial physical examination blood pressure 113/71, pulse rate 117, respiratory rate 21, oxygen saturation 95%, she had dry mucous membranes, her lungs are clear to auscultation bilaterally, heart S1-S2 present tachycardic, abdomen distended, tympanic, tender to deep palpation, no lower extremity edema. Sodium 141, potassium 4.6, chloride 104, bicarb 27, glucose 81, BUN 30, creatinine 1.52, AST 15, ALT 7, white count 12.0, hemoglobin 11.8, hematocrit 37.9, platelets 323.  SARS COVID-19 is pending.  CT of the abdomen with 5 mm left UVJ stone with mild to moderate left-sided hydronephrosis, multiple additional bilateral nonobstructing renal calculi.  New 7.8 x 7.3 cm rounded air-filled structure immediately adjacent to the sigmoid colon with mildly thickened wall and adjacent free fluid consistent with acute diverticulitis of giant diverticulum.  Her chest x-ray was negative for infiltrates.  Patient will be admitted to the hospital with the working diagnosis of sepsis due to acute diverticulitis, present on admission.  1.  Sepsis due to acute diverticulitis.  7.8 x 7.3 cm rounded giant diverticulum, that seems to be inflamed, patient failed outpatient therapy with oral antibiotics.  Patient will be placed on intravenous fluids with lactated Ringer at 75 mL/h, antibiotic therapy with IV Zosyn, analgesics with as needed  morphine and antiemetics with as needed Zofran.    2.  Systolic heart failure, ejection fraction 35% status post AICD.  No signs of acute decompensation, continue volume resuscitation with balanced electrolyte solutions intravenously.  Continue blood pressure monitoring.  Hold on diuretics (furosemide).  Continue carvedilol 12.5 g twice daily.  3.  Hypertension.  Continue intravenous fluids, hold losartan to prevent hypotension  4.  Atrial fibrillation/paroxysmal.  Will continue carvedilol and amiodarone for rate control, warfarin dosing per pharmacy protocol.  5.  Chronic kidney disease stage 3b.  Old records personally reviewed, her baseline creatinine is 1.5, will continue intravenous balanced electrolyte solutions, follow-up on kidney function in the morning, avoid hypotension and nephrotoxic agents.  6. Depression. Continue with mirtazapine.   7. T2DM. Will continue glucose cover and monitoring with insulin sliding scale. Patient with poor oral intake and admission glucose is 81 mg/dl.   DVT prophylaxis:  Warfarin  Code Status: full  Family Communication: I spoke with patient's husband at the bedside and all questions were addressed.   Disposition Plan: medical ward.   Consults called: none   Admission status:  Inpatient.     Berneda Piccininni Gerome Apley MD [facility]   12/21/2018, 2:44 PM

## 2018-12-21 NOTE — ED Provider Notes (Signed)
[facility] DEPT Provider Note   CSN: [id number] Arrival date & time: 12/21/18  0932     History   Chief Complaint Chief Complaint  Patient presents with   Fatigue   Dysuria    HPI [name] is a 78 y.o. female.     Patient is a 78 year old female with extensive past medical history including congestive heart failure, atrial fibrillation, AICD placement, ischemic cardiomyopathy, hypertension, prior CVA, and coronary artery disease with prior CABG.  She is brought by EMS for evaluation of weakness.  From what the patient describes, she has had progressive weakness over the past several days to the point she is now having difficulty ambulating.  The patient denies to me she is experiencing any specific symptoms.  She denies any chest pain or difficulty breathing.  She denies any fevers or chills.  According to the triage note, there is the possibility of blood in the stool or urine, however the patient denies this.  The history is provided by the patient.    Past Medical History:  Diagnosis Date   AICD (automatic cardioverter/defibrillator) present    2014   Anemia    Arthritis    OSTEO   Atrial fibrillation [facility])    A.fib/flutter: s/p MAZE 2008, DCCV 2011, on coumadin; previously on flecainide, but dc'd due to worsening EF. Tikosyn discontinued 06/2012 after cardiac arrest.   Carcinoma in situ of vulva    Chronic systolic CHF (congestive heart failure) (HCC)    EF 35%   Coronary artery disease    Occluded LM 2/2 previous aortic root surgery; s/p 2v CABG 2010 LIMA to LAD, SVG to OM; Cath 03/11/12 patent SVG to OM, atretic LIMA to LAD, patent RCA, occluded native LM, EF 35%    CVA (cerebral vascular accident) ([place])    2008 - felt due to oscillating calcium on aortic valve   Diabetes mellitus    Diverticulitis    Dysrhythmia    Fibroid    Fracture of lumbar spine (HCC)    GERD (gastroesophageal reflux disease)    Hypertension     Ischemic cardiomyopathy    EF 35%   Kidney stones    Mitral regurgitation    s/p mitral valve repair 2008   Osteoporosis    Ovarian cyst, left 2008-2009   Severe aortic stenosis    s/p homograft aortic root replacement 2008   Stroke [facility])    Superficial venous thrombosis of upper extremity    06/2012 - LUE   Thoracic aortic aneurysm [facility])    s/p resection and grafting 2008   Ventricular fibrillation ([place])    VF arrest/VT/torsades 06/2012 with prolonged hosp with VDRF, cardiogen shock, asp PNA, encephalopathy, anemia, shock liver, hypernatremia - s/p ICD implantation 06/08/12.    Patient Active Problem List   Diagnosis Date Noted   Pain due to onychomycosis of toenails of both feet 10/03/2018   Coagulation disorder ([place]) 10/03/2018   Diabetes mellitus without complication ([place]) 67/20/9470   ICD (implantable cardioverter-defibrillator) in place 11/28/2012   Kidney disease, chronic, stage III (GFR 30-59 ml/min) 10/19/2012   Valvular heart disease-bicuspid aortic valve s/p Mechanical replacement//MV repair    Back pain, thoracic 02/21/2012   Ischemic cardiomyopathy 01/26/2012   Carcinoma in situ of vulva    Fibroid    Ovarian cyst, left    Osteoporosis, idiopathic    Type II or unspecified type diabetes mellitus with renal manifestations, uncontrolled(250.42)    GERD (gastroesophageal reflux disease)  Encounter for long-term (current) use of anticoagulants 06/08/2010   Mitral valve disease 04/16/2010   VITAMIN D DEFICIENCY 11/05/2008   VITAMIN B12 DEFICIENCY 10/22/2008   Essential hypertension, benign 07/30/2008   Celiac disease 08/09/2007   HYPERLIPIDEMIA 07/21/2007   Atrial fibrillation ([place]) 07/21/2007   CHOLELITHIASIS 07/21/2007   UNSPECIFIED IRON DEFICIENCY ANEMIA 06/06/2007    Past Surgical History:  Procedure Laterality Date   aneurysm and left sided maze  2008   AORTIC ROOT REPLACEMENT  2008   homograft   CARDIAC  DEFIBRILLATOR PLACEMENT  06/08/2012   [employer] [address]  implanted by Dr Rayann Heman following a cardiac arrest   CATARACT EXTRACTION     CORONARY ARTERY BYPASS GRAFT  4/10   LIMA to LAD, SVG to OM   EYE SURGERY     FOOT SURGERY Left    removed neuroma   IMPLANTABLE CARDIOVERTER DEFIBRILLATOR IMPLANT N/A 06/08/2012   Procedure: IMPLANTABLE CARDIOVERTER DEFIBRILLATOR IMPLANT;  Surgeon: Thompson Grayer, MD;  Location: [facility] CATH LAB;  Service: Cardiovascular;  Laterality: N/A;   KYPHOPLASTY N/A 12/11/2014   Procedure: KYPHOPLASTY T-12;  Surgeon: Karie Chimera, MD;  Location: [facility] NEURO ORS;  Service: Neurosurgery;  Laterality: N/A;  KYPHOPLASTY T-12   MITRAL VALVE REPLACEMENT  2008   due to severe MR   Resection and grafting of ascending thoracic aortic     RIGHT HEART CATHETERIZATION N/A 04/03/2012   Procedure: RIGHT HEART CATH;  Surgeon: [name], MD;  Location: [facility] CATH LAB;  Service: Cardiovascular;  Laterality: N/A;   WIDE EXCISION OF VULVA     CA INSITU     OB History    Gravida  0   Para      Term      Preterm      AB      Living        SAB      TAB      Ectopic      Multiple      Live Births               Home Medications    Prior to Admission medications   Medication Sig Start Date End Date Taking? Authorizing Provider  carvedilol (COREG) 12.5 MG tablet TAKE 1 TABLET BY MOUTH TWICE A DAY WITH A MEAL 11/20/18   Lelon Perla, MD  Cyanocobalamin (VITAMIN B-12) 2500 MCG SUBL Place 1 tablet under the tongue every Monday, Wednesday, and Friday.     [provider]  ergocalciferol (VITAMIN D2) 50000 UNITS capsule Take 50,000 Units by mouth once a week. Monday    [provider]  furosemide (LASIX) 20 MG tablet TAKE ONE TABLET ONCE DAILY AND TAKE 1 EXTRA AS NEEDED 07/07/18   Lelon Perla, MD  losartan (COZAAR) 100 MG tablet Take 100 mg by mouth daily.    [provider]  losartan (COZAAR) 50 MG tablet TAKE 1  TABLET (50 MG TOTAL) BY MOUTH DAILY. 05/03/18   Lelon Perla, MD  NON FORMULARY ([employer] [facility]) Take 500 mg of elemental calcium by mouth at bedtime    [provider]  PACERONE 200 MG tablet TAKE 1/2 TABLET BY MOUTH DAILY. 08/10/18   Allred, Jeneen Rinks, MD  Psyllium (METAMUCIL PO) Take 1 teaspoon by mouth daily with water    [provider]  warfarin (COUMADIN) 1 MG tablet Take as directed by Coumadin Clinic 07/05/18   Lelon Perla, MD  warfarin (COUMADIN) 3 MG tablet TAKE 1/2  TO 1 TABLET BY MOUTH AS DIRECTED BY COUMADIN CLINIC 11/06/18   Lelon Perla, MD    Family History Family History  Problem Relation Age of Onset   Hypertension Mother    Breast cancer Maternal Aunt        age 96's   Colon cancer Paternal Grandmother    Diabetes Maternal Uncle    Diabetes Paternal Uncle    Emphysema Father     Social History Social History   Tobacco Use   Smoking status: Former Smoker    Packs/day: 0.25    Years: 20.00    Pack years: 5.00    Types: Cigarettes    Quit date: 03/09/1983    Years since quitting: 35.8   Smokeless tobacco: Never Used  Substance Use Topics   Alcohol use: No   Drug use: No     Allergies   Metformin and related, Tikosyn [dofetilide], and Caffeine   Review of Systems Review of Systems  All other systems reviewed and are negative.    Physical Exam Updated Vital Signs BP (!) 94/56    Pulse 62    Temp 97.9 F (36.6 C) (Oral)    Resp 19    SpO2 98%   Physical Exam Vitals signs and nursing note reviewed.  Constitutional:      General: She is not in acute distress.    Appearance: She is well-developed. She is not diaphoretic.     Comments: Patient is an elderly female in no acute distress.  She is awake, alert, and oriented.  HENT:     Head: Normocephalic and atraumatic.  Neck:     Musculoskeletal: Normal range of motion and neck supple.  Cardiovascular:     Rate and Rhythm: Normal rate and regular rhythm.     Heart  sounds: No murmur. No friction rub. No gallop.   Pulmonary:     Effort: Pulmonary effort is normal. No respiratory distress.     Breath sounds: Normal breath sounds. No wheezing.  Abdominal:     General: Bowel sounds are normal. There is no distension.     Palpations: Abdomen is soft.     Tenderness: There is no abdominal tenderness.  Musculoskeletal: Normal range of motion.  Skin:    General: Skin is warm and dry.  Neurological:     Mental Status: She is alert and oriented to person, place, and time.     Cranial Nerves: No cranial nerve deficit.     Motor: No weakness.     Coordination: Coordination normal.     Gait: Gait normal.     Deep Tendon Reflexes: Reflexes normal.      ED Treatments / Results  Labs (all labs ordered are listed, but only abnormal results are displayed) Labs Reviewed  COMPREHENSIVE METABOLIC PANEL  CBC WITH DIFFERENTIAL/PLATELET  URINALYSIS, ROUTINE W REFLEX MICROSCOPIC  PROTIME-INR  TROPONIN I (HIGH SENSITIVITY)    EKG None  Radiology No results found.  Procedures Procedures (including critical care time)  Medications Ordered in ED Medications  sodium chloride 0.9 % bolus 500 mL (has no administration in time range)     Initial Impression / Assessment and Plan / ED Course  I have reviewed the triage vital signs and the nursing notes.  Pertinent labs & imaging results that were available during my care of the patient were reviewed by me and considered in my medical decision making (see chart for details).  Patient with weakness, bloody stool/bloody urine.  Her work-up shows  diverticulitis of a giant diverticulum as well as a 5 mm stone at the left UVJ.  Patient does have a slight white count, but is afebrile.  She will be admitted for IV antibiotics and further evaluation.  Final Clinical Impressions(s) / ED Diagnoses   Final diagnoses:  None    ED Discharge Orders    None       Veryl Speak, MD 12/21/18 1535

## 2018-12-21 NOTE — Progress Notes (Signed)
Pt arrived to unit from ED on stretcher. Slid to floor bed w/ 2 assist. VSS. Peri care performed. Pt alert. Denies any pain/discomfort. Oriented to callbell and environment. REsting comfortably.

## 2018-12-21 NOTE — Progress Notes (Signed)
PHARMACY NOTE -  [facility] has been assisting with dosing of Zosyn for diverticulitis.  Dosage remains stable at 3.375 g IV q8 hr by extended infusion and need for further dosage adjustment appears unlikely at present given SCr at baseline  Pharmacy will sign off, following peripherally for culture results or dose adjustments. Please reconsult if a change in clinical status warrants re-evaluation of dosage.  Reuel Boom, PharmD, BCPS [phone] 12/21/2018, 4:47 PM

## 2018-12-21 NOTE — ED Notes (Signed)
Attempted to call report; RN unable to take report at this time.

## 2018-12-21 NOTE — ED Notes (Signed)
ED TO INPATIENT HANDOFF REPORT  ED Nurse Name and Phone #: Anderson Malta [phone]/ [place] Name/Age/Gender [name] 78 y.o. female Room/Bed: WA13/WA13  Code Status   Code Status: Prior  Home/SNF/Other Home Patient oriented to: self, place, time and situation Is this baseline? Yes   Triage Complete: Triage complete  Chief Complaint weakness  Triage Note Per [facility] pt from home for weakness for couple weeks, "thin stools" and unsure if blood from stools or urination per spouse, and pains with urination. Normally able to walk around, but hasn't been able to the past couple days so husband had her in a wheelchair.  Vitals: 108/56, 70 HR and regular, 20R, 97% on RA, CBG 228, Temp 98.2.    Allergies Allergies  Allergen Reactions  . Metformin And Related     Renal issues  . Tikosyn [Dofetilide]     Passed out  . Caffeine Other (See Comments)    Affects heart    Level of Care/Admitting Diagnosis ED Disposition    ED Disposition Condition Comment   Admit  Hospital Area: [place] [[id number]]  Level of Care: Med-Surg [16]  Covid Evaluation: Person Under Investigation (PUI)  Diagnosis: Diverticulitis [882800]  Admitting Physician: Tawni Millers [[id number]]  Attending Physician: Tawni Millers [[id number]]  Estimated length of stay: 3 - 4 days  Certification:: I certify this patient will need inpatient services for at least 2 midnights  PT Class (Do Not Modify): Inpatient [101]  PT Acc Code (Do Not Modify): Private [1]       B Medical/Surgery History Past Medical History:  Diagnosis Date  . AICD (automatic cardioverter/defibrillator) present    2014  . Anemia   . Arthritis    OSTEO  . Atrial fibrillation [facility])    A.fib/flutter: s/p MAZE 2008, DCCV 2011, on coumadin; previously on flecainide, but dc'd due to worsening EF. Tikosyn discontinued 06/2012 after cardiac arrest.  . Carcinoma in situ of vulva   . Chronic systolic CHF  (congestive heart failure) (HCC)    EF 35%  . Coronary artery disease    Occluded LM 2/2 previous aortic root surgery; s/p 2v CABG 2010 LIMA to LAD, SVG to OM; Cath 03/11/12 patent SVG to OM, atretic LIMA to LAD, patent RCA, occluded native LM, EF 35%   . CVA (cerebral vascular accident) ([place])    2008 - felt due to oscillating calcium on aortic valve  . Diabetes mellitus   . Diverticulitis   . Dysrhythmia   . Fibroid   . Fracture of lumbar spine ([place])   . GERD (gastroesophageal reflux disease)   . Hypertension   . Ischemic cardiomyopathy    EF 35%  . Kidney stones   . Mitral regurgitation    s/p mitral valve repair 2008  . Osteoporosis   . Ovarian cyst, left 2008-2009  . Severe aortic stenosis    s/p homograft aortic root replacement 2008  . Stroke ([place])   . Superficial venous thrombosis of upper extremity    06/2012 - LUE  . Thoracic aortic aneurysm [facility])    s/p resection and grafting 2008  . Ventricular fibrillation ([place])    VF arrest/VT/torsades 06/2012 with prolonged hosp with VDRF, cardiogen shock, asp PNA, encephalopathy, anemia, shock liver, hypernatremia - s/p ICD implantation 06/08/12.   Past Surgical History:  Procedure Laterality Date  . aneurysm and left sided maze  2008  . AORTIC ROOT REPLACEMENT  2008   homograft  . CARDIAC DEFIBRILLATOR PLACEMENT  06/08/2012  [place] DR  implanted by Dr Rayann Heman following a cardiac arrest  . CATARACT EXTRACTION    . CORONARY ARTERY BYPASS GRAFT  4/10   LIMA to LAD, SVG to OM  . EYE SURGERY    . FOOT SURGERY Left    removed neuroma  . IMPLANTABLE CARDIOVERTER DEFIBRILLATOR IMPLANT N/A 06/08/2012   Procedure: IMPLANTABLE CARDIOVERTER DEFIBRILLATOR IMPLANT;  Surgeon: Thompson Grayer, MD;  Location[facility];  Service: Cardiovascular;  Laterality: N/A;  . KYPHOPLASTY N/A 12/11/2014   Procedure: KYPHOPLASTY T-12;  Surgeon: Karie Chimera, MD;  Location: [place] NEURO ORS;  Service: Neurosurgery;  Laterality: N/A;   KYPHOPLASTY T-12  . MITRAL VALVE REPLACEMENT  2008   due to severe MR  . Resection and grafting of ascending thoracic aortic    . RIGHT HEART CATHETERIZATION N/A 04/03/2012   Procedure: RIGHT HEART CATH;  Surgeon: [name], MD;  Location: [facility] CATH LAB;  Service: Cardiovascular;  Laterality: N/A;  . WIDE EXCISION OF VULVA     CA INSITU     A IV Location/Drains/Wounds Patient Lines/Drains/Airways Status   Active Line/Drains/Airways    Name:   Placement date:   Placement time:   Site:   Days:   Peripheral IV 11/11/14 Right Forearm   11/11/14    1311    Forearm   1501   Peripheral IV 12/21/18 Right Forearm   12/21/18    1034    Forearm   less than 1   Incision (Closed) 12/11/14 Back   12/11/14    0939     1471          Intake/Output Last 24 hours  Intake/Output Summary (Last 24 hours) at 12/21/2018 1742 Last data filed at 12/21/2018 1528 Gross per 24 hour  Intake 1000 ml  Output -  Net 1000 ml    Labs/Imaging Results for orders placed or performed during the hospital encounter of 12/21/18 (from the past 48 hour(s))  Comprehensive metabolic panel     Status: Abnormal   Collection Time: 12/21/18 11:02 AM  Result Value Ref Range   Sodium 141 135 - 145 mmol/L   Potassium 4.6 3.5 - 5.1 mmol/L   Chloride 104 98 - 111 mmol/L   CO2 27 22 - 32 mmol/L   Glucose, Bld 181 (H) 70 - 99 mg/dL   BUN 30 (H) 8 - 23 mg/dL   Creatinine, Ser 1.52 (H) 0.44 - 1.00 mg/dL   Calcium 9.1 8.9 - 10.3 mg/dL   Total Protein 6.0 (L) 6.5 - 8.1 g/dL   Albumin 3.1 (L) 3.5 - 5.0 g/dL   AST 15 15 - 41 U/L   ALT 7 0 - 44 U/L   Alkaline Phosphatase 29 (L) 38 - 126 U/L   Total Bilirubin 1.1 0.3 - 1.2 mg/dL   GFR calc non Af Amer 33 (L) >60 mL/min   GFR calc Af Amer 38 (L) >60 mL/min   Anion gap 10 5 - 15    Comment: Performed at [facility], [place] [address], [place],  [address]  CBC with Differential     Status: Abnormal   Collection Time: 12/21/18 11:02 AM  Result Value  Ref Range   WBC 12.0 (H) 4.0 - 10.5 K/uL   RBC 3.70 (L) 3.87 - 5.11 MIL/uL   Hemoglobin 11.8 (L) 12.0 - 15.0 g/dL   HCT 37.9 36.0 - 46.0 %   MCV 102.4 (H) 80.0 - 100.0 fL   MCH 31.9 26.0 -  34.0 pg   MCHC 31.1 30.0 - 36.0 g/dL   RDW 14.8 11.5 - 15.5 %   Platelets 323 150 - 400 K/uL   nRBC 0.0 0.0 - 0.2 %   Neutrophils Relative % 91 %   Neutro Abs 11.0 (H) 1.7 - 7.7 K/uL   Lymphocytes Relative 3 %   Lymphs Abs 0.3 (L) 0.7 - 4.0 K/uL   Monocytes Relative 5 %   Monocytes Absolute 0.6 0.1 - 1.0 K/uL   Eosinophils Relative 1 %   Eosinophils Absolute 0.1 0.0 - 0.5 K/uL   Basophils Relative 0 %   Basophils Absolute 0.0 0.0 - 0.1 K/uL   Immature Granulocytes 0 %   Abs Immature Granulocytes 0.05 0.00 - 0.07 K/uL    Comment: Performed at [facility], [place] [address], [place], [place] [address]  Troponin I (High Sensitivity)     Status: None   Collection Time: 12/21/18 11:02 AM  Result Value Ref Range   Troponin I (High Sensitivity) 7 <18 ng/L    Comment: (NOTE) Elevated high sensitivity troponin I (hsTnI) values and significant  changes across serial measurements may suggest ACS but many other  chronic and acute conditions are known to elevate hsTnI results.  Refer to the "Links" section for chest pain algorithms and additional  guidance. Performed at [facility], [place] [address] [place], [place] [address]   Protime-INR     Status: Abnormal   Collection Time: 12/21/18 11:02 AM  Result Value Ref Range   Prothrombin Time 25.5 (H) 11.4 - 15.2 seconds   INR 2.4 (H) 0.8 - 1.2    Comment: (NOTE) INR goal varies based on device and disease states. Performed at [facility], [place] [address] [place], [place] [address]   Troponin I (High Sensitivity)     Status: None   Collection Time: 12/21/18  1:49 PM  Result Value Ref Range   Troponin I (High Sensitivity) 9 <18 ng/L    Comment: (NOTE) Elevated high sensitivity troponin I  (hsTnI) values and significant  changes across serial measurements may suggest ACS but many other  chronic and acute conditions are known to elevate hsTnI results.  Refer to the "Links" section for chest pain algorithms and additional  guidance. Performed at [facility], [place] [address] [place], [place] [address]   SARS Coronavirus 2 by RT PCR (hospital order, performed in [facility] hospital lab) Nasopharyngeal Nasopharyngeal Swab     Status: None   Collection Time: 12/21/18  2:56 PM   Specimen: Nasopharyngeal Swab  Result Value Ref Range   SARS Coronavirus 2 NEGATIVE NEGATIVE    Comment: (NOTE) If result is NEGATIVE SARS-CoV-2 target nucleic acids are NOT DETECTED. The SARS-CoV-2 RNA is generally detectable in upper and lower  respiratory specimens during the acute phase of infection. The lowest  concentration of SARS-CoV-2 viral copies this assay can detect is 250  copies / mL. A negative result does not preclude SARS-CoV-2 infection  and should not be used as the sole basis for treatment or other  patient management decisions.  A negative result may occur with  improper specimen collection / handling, submission of specimen other  than nasopharyngeal swab, presence of viral mutation(s) within the  areas targeted by this assay, and inadequate number of viral copies  (<250 copies / mL). A negative result must be combined with clinical  observations, patient history, and epidemiological information. If result is POSITIVE SARS-CoV-2 target nucleic acids  are DETECTED. The SARS-CoV-2 RNA is generally detectable in upper and lower  respiratory specimens dur ing the acute phase of infection.  Positive  results are indicative of active infection with SARS-CoV-2.  Clinical  correlation with patient history and other diagnostic information is  necessary to determine patient infection status.  Positive results do  not rule out bacterial infection or co-infection with  other viruses. If result is PRESUMPTIVE POSTIVE SARS-CoV-2 nucleic acids MAY BE PRESENT.   A presumptive positive result was obtained on the submitted specimen  and confirmed on repeat testing.  While 2019 novel coronavirus  (SARS-CoV-2) nucleic acids may be present in the submitted sample  additional confirmatory testing may be necessary for epidemiological  and / or clinical management purposes  to differentiate between  SARS-CoV-2 and other Sarbecovirus currently known to infect humans.  If clinically indicated additional testing with an alternate test  methodology [phone]) is advised. The SARS-CoV-2 RNA is generally  detectable in upper and lower respiratory sp ecimens during the acute  phase of infection. The expected result is Negative. Fact Sheet for Patients:  StrictlyIdeas.no Fact Sheet for Healthcare Providers: BankingDealers.co.za This test is not yet approved or cleared by the [place] FDA and has been authorized for detection and/or diagnosis of SARS-CoV-2 by FDA under an Emergency Use Authorization (EUA).  This EUA will remain in effect (meaning this test can be used) for the duration of the COVID-19 declaration under Section 564(b)(1) of the Act, 21 U.S.C. section 360bbb-3(b)(1), unless the authorization is terminated or revoked sooner. Performed at [facility], [place] [address], [place], [place] [address]    Dg Chest Port 1 View  Result Date: 12/21/2018 CLINICAL DATA:  Weakness EXAM: PORTABLE CHEST 1 VIEW COMPARISON:  11/28/2017 FINDINGS: Left AICD remains in place, unchanged. Prior median sternotomy and valve replacement. Cardiomegaly. Prominence of the right hilum could be vascular although adenopathy cannot be excluded. No confluent opacities, effusions or edema. No acute bony abnormality. IMPRESSION: Cardiomegaly. Prominence of the right hilum felt to most likely be related to vascular congestion  although adenopathy cannot be completely excluded. This could be further evaluated with chest CT with IV contrast if felt clinically indicated. Electronically Signed   By: Rolm Baptise M.D.   On: 12/21/2018 10:20   Ct Renal Stone Study  Result Date: 12/21/2018 CLINICAL DATA:  Weakness, flank pain EXAM: CT ABDOMEN AND PELVIS WITHOUT CONTRAST TECHNIQUE: Multidetector CT imaging of the abdomen and pelvis was performed following the standard protocol without IV contrast. COMPARISON:  03/31/2018 FINDINGS: Lower chest: Cardiomegaly. Lung bases clear. Hepatobiliary: Liver is within normal limits. Numerous layering stones within the gallbladder lumen. No biliary dilatation. Pancreas: Unremarkable. No pancreatic ductal dilatation or surrounding inflammatory changes. Spleen: No acute abnormality. Adrenals/Urinary Tract: Thickened appearance of the left adrenal gland is unchanged. Right adrenal gland is normal. Multiple large renal calculi bilaterally. Largest on the right measures 1.6 x 0.9 cm. Calculus within the left renal pelvis measures 1.8 x 1.0 cm. Mild-to-moderate left-sided hydronephrosis. Bilateral ureters are nondilated. 5 mm stone at the left ureterovesical junction. Urinary bladder is grossly unremarkable. 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 There is a large rounded air-filled structure adjacent to the sigmoid colon measuring 7.8 x 7.3 cm with mildly thickened wall, new from prior suggestive of a giant colonic diverticulum (series 3, image 50). There is moderate free fluid adjacent to this diverticulum. No well-defined fluid collection to suggest abscess. No evidence of perforation. Small hiatal hernia. Stomach  and small bowel otherwise unremarkable. No dilated small bowel. Vascular/Lymphatic: No significant vascular findings are present. No enlarged abdominal or pelvic lymph nodes. Reproductive: Anteverted uterus with multiple coarsely calcified fibroids. No adnexal  masses. Other: No abdominal wall hernia. Musculoskeletal: Multiple prior vertebral body compression deformities including T11, T12, L1, L3, and L4. No new or worsening vertebral body compression fracture compared to 03/31/2018. No acute osseous findings. IMPRESSION: 1. 5 mm left UVJ stone with mild-to-moderate left-sided hydroureteronephrosis. Multiple additional bilateral nonobstructing renal calculi. 2. New 7.8 x 7.3 cm rounded air-filled structure immediately adjacent to the sigmoid colon with mildly thickened wall and adjacent free fluid. 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 No abscess or evidence of perforation. 3. Uncomplicated cholelithiasis. 4. Fibroid uterus. 5. Multiple prior vertebral body compression deformities. No new or worsening vertebral body compression fracture compared to prior. These results were called by telephone at the time of interpretation on 12/21/2018 at 2:14 pm to provider Veryl Speak , who verbally acknowledged these results. Electronically Signed   By: Davina Poke M.D.   On: 12/21/2018 14:16    Pending Labs Unresulted Labs (From admission, onward)    Start     Ordered   12/22/18 0500  Protime-INR  Daily,   R     12/21/18 1642   12/21/18 1003  Urinalysis, Routine w reflex microscopic  ONCE - STAT,   STAT     12/21/18 1003   Signed and Held  CBC  (enoxaparin (LOVENOX)    CrCl >/= 30 ml/min)  Once,   R    Comments: Baseline for enoxaparin therapy IF NOT ALREADY DRAWN.  Notify MD if PLT < 100 K.    Signed and Held   Signed and Held  Creatinine, serum  (enoxaparin (LOVENOX)    CrCl >/= 30 ml/min)  Once,   R    Comments: Baseline for enoxaparin therapy IF NOT ALREADY DRAWN.    Signed and Held   Signed and Held  Creatinine, serum  (enoxaparin (LOVENOX)    CrCl >/= 30 ml/min)  Weekly,   R    Comments: while on enoxaparin therapy    Signed and Held   Signed and Held  Basic metabolic panel  Tomorrow morning,   R     Signed and  Held   Signed and Held  CBC  Tomorrow morning,   R     Signed and Held   Signed and Held  Hemoglobin A1c  Once,   R    Comments: To assess prior glycemic control    Signed and Held          Vitals/Pain Today's Vitals   12/21/18 1200 12/21/18 1300 12/21/18 1501 12/21/18 1719  BP: 131/65 132/73 113/71 (!) 128/55  Pulse: 63 66 (!) 117 67  Resp: (!) 24 13 (!) 21 19  Temp:      TempSrc:      SpO2: 98% 95% 98% 98%  PainSc:        Isolation Precautions Airborne and Contact precautions  Medications Medications  piperacillin-tazobactam (ZOSYN) IVPB 3.375 g (3.375 g Intravenous New Bag/Given 12/21/18 1537)  warfarin (COUMADIN) tablet 1 mg (has no administration in time range)  Warfarin - Pharmacist Dosing Inpatient ( Does not apply Canceled Entry 12/21/18 1800)  piperacillin-tazobactam (ZOSYN) IVPB 3.375 g (has no administration in time range)  sodium chloride 0.9 % bolus 500 mL (0 mLs Intravenous Stopped 12/21/18 1528)  sodium chloride 0.9 % bolus 500 mL (0 mLs  Intravenous Stopped 12/21/18 1313)    Mobility walks with person assist High fall risk   Focused Assessments    R Recommendations: See Admitting Provider Note  Report given to:   Additional Notes:

## 2018-12-21 NOTE — ED Triage Notes (Signed)
Per [facility] pt from home for weakness for couple weeks, "thin stools" and unsure if blood from stools or urination per spouse, and pains with urination. Normally able to walk around, but hasn't been able to the past couple days so husband had her in a wheelchair.  Vitals: 108/56, 70 HR and regular, 20R, 97% on RA, CBG 228, Temp 98.2.

## 2018-12-22 ENCOUNTER — Encounter: Payer: Medicare Other | Admitting: *Deleted

## 2018-12-22 DIAGNOSIS — I10 Essential (primary) hypertension: Secondary | ICD-10-CM

## 2018-12-22 LAB — CBC
HCT: 36.2 % (ref 36.0–46.0)
Hemoglobin: 11.5 g/dL — ABNORMAL LOW (ref 12.0–15.0)
MCH: 32.4 pg (ref 26.0–34.0)
MCHC: 31.8 g/dL (ref 30.0–36.0)
MCV: 102 fL — ABNORMAL HIGH (ref 80.0–100.0)
Platelets: 324 10*3/uL (ref 150–400)
RBC: 3.55 MIL/uL — ABNORMAL LOW (ref 3.87–5.11)
RDW: 14.7 % (ref 11.5–15.5)
WBC: 11.3 10*3/uL — ABNORMAL HIGH (ref 4.0–10.5)
nRBC: 0 % (ref 0.0–0.2)

## 2018-12-22 LAB — BASIC METABOLIC PANEL
Anion gap: 13 (ref 5–15)
BUN: 26 mg/dL — ABNORMAL HIGH (ref 8–23)
CO2: 23 mmol/L (ref 22–32)
Calcium: 8.4 mg/dL — ABNORMAL LOW (ref 8.9–10.3)
Chloride: 105 mmol/L (ref 98–111)
Creatinine, Ser: 1.25 mg/dL — ABNORMAL HIGH (ref 0.44–1.00)
GFR calc Af Amer: 48 mL/min — ABNORMAL LOW (ref >60)
GFR calc non Af Amer: 41 mL/min — ABNORMAL LOW (ref >60)
Glucose, Bld: 116 mg/dL — ABNORMAL HIGH (ref 70–99)
Potassium: 4.6 mmol/L (ref 3.5–5.1)
Sodium: 141 mmol/L (ref 135–145)

## 2018-12-22 LAB — URINALYSIS, ROUTINE W REFLEX MICROSCOPIC
Bilirubin Urine: NEGATIVE
Glucose, UA: NEGATIVE mg/dL
Ketones, ur: NEGATIVE mg/dL
Leukocytes,Ua: NEGATIVE
Nitrite: NEGATIVE
Protein, ur: NEGATIVE mg/dL
RBC / HPF: >50 RBC/hpf — ABNORMAL HIGH (ref 0–5)
Specific Gravity, Urine: 1.011 (ref 1.005–1.030)
pH: 5 (ref 5.0–8.0)

## 2018-12-22 LAB — GLUCOSE, CAPILLARY
Glucose-Capillary: 110 mg/dL — ABNORMAL HIGH (ref 70–99)
Glucose-Capillary: 118 mg/dL — ABNORMAL HIGH (ref 70–99)
Glucose-Capillary: 105 mg/dL — ABNORMAL HIGH (ref 70–99)
Glucose-Capillary: 102 mg/dL — ABNORMAL HIGH (ref 70–99)

## 2018-12-22 LAB — PROTIME-INR
INR: 2.6 — ABNORMAL HIGH (ref 0.8–1.2)
Prothrombin Time: 27.4 seconds — ABNORMAL HIGH (ref 11.4–15.2)

## 2018-12-22 MED ORDER — WARFARIN SODIUM 1 MG PO TABS
1.0000 mg | ORAL_TABLET | Freq: Once | ORAL | Status: AC
  Administered 2018-12-22: 1 mg via ORAL
  Filled 2018-12-22: qty 1

## 2018-12-22 NOTE — Plan of Care (Signed)

## 2018-12-22 NOTE — [place] Notes (Signed)
Pt urine sent to lab as ordered, urine concentrated dark yellow with brownish red sediment. SRP, RN

## 2018-12-22 NOTE — [place] Notes (Addendum)
[name] for warfarin Indication: Afib, mechanical aortic valve  Allergies  Allergen Reactions  . Metformin And Related     Renal issues  . Tikosyn [Dofetilide]     Passed out  . Caffeine Other (See Comments)    Affects heart    Patient Measurements:    Vital Signs: Temp: 98 F (36.7 C) (10/16 0431) Temp Source: Oral (10/16 0431) BP: 131/70 (10/16 0431) Pulse Rate: 78 (10/16 0431)  Labs: Recent Labs    12/20/18 1332 12/21/18 1102 12/21/18 1349 12/22/18 0348  HGB  --  11.8*  --  11.5*  HCT  --  37.9  --  36.2  PLT  --  323  --  324  LABPROT  --  25.5*  --  27.4*  INR 2.3 2.4*  --  2.6*  CREATININE  --  1.52*  --  1.25*  TROPONINIHS  --  7 9  --     CrCl cannot be calculated (Unknown ideal weight.).  Assessment: 53 yoF with PMH Afib and aortic valve replacement on warfarin PTA, HFrEF (35%) s/p AICD, DM2, HTN, diverticulosis, anemia, presents with diarrhea and abdominal pain. Recently treated for diverticulitis with 2 courses of abx; current CT shows persistent diverticulitis of giant colonic diverticulum. Admitted for IV abx; pharmacy consulted to resume warfarin while admitted.   Baseline INR therapeutic  Prior anticoagulation: warfarin 1.5 mg daily except 1 mg Thurs & Sun, LD 10/14  Significant events:  Today, 12/22/2018:  CBC: Hgb slightly low and slowly falling, Plts WNL and steady  INR therapeutic at 2.6, trending up  Major drug interactions: amiodarone PTA (chronic med); broad-spectrum abx  No bleeding issues per nursing  CLD  Goal of Therapy: INR 2-3  Plan:  Warfarin 1 mg PO tonight at 18:00  Daily INR  CBC at least q72 hr while on warfarin  Monitor for signs of bleeding or thrombosis   Natale Lay, PharmD Candidate 12/22/2018, 11:07 AM

## 2018-12-22 NOTE — Progress Notes (Signed)
PROGRESS NOTE  [name] CNO:[id number] DOB: [birth year] DOA: 12/21/2018 PCP: Velna Hatchet, MD  HPI/Recap of past 24 hours: HPI from Dr Cathlean Sauer [name] is a 78 y.o. female with medical history significant of ischemic cardiomyopathy with ejection fraction 35% status post AICD 2014, atrial fibrillation status post maze 2008, type 2 diabetes mellitus, hypertension, diverticulosis, arthritis and anemia. Patient has been not feeling well for the past 3 weeks, consistent with diarrhea and abdominal pain.  She was diagnosed as an outpatient with diverticulitis, and she had 2 rounds of antibiotics, one course for 7 days and the other for about 14 days with no improvement of her symptoms.  At home she continued to have severe diarrhea, multiple times per day, watery, associated with poor oral intake, generalized weakness. No improving or worsening factors. Patient has been self quarantine and has not been in contact with other people beside her husband. In the ED, patient was noted to be tachycardic, mildly hypotensive, labs showed creatinine was 1.52 and her white cell count 12.0.  Abdominal CT showed diverticulitis of a giant colonic diverticulum.  Patient admitted for further management.    Today, patient reported generalized abdominal pain, still with diarrhea, denies any nausea/vomiting, chest pain, fever/chills.  Patient seemed somewhat confused this morning.  Unable to tell me where she is or what year we are in.. Was oriented to herself and date of birth.   Assessment/Plan: Principal Problem:   Sepsis ([place]) Active Problems:   Essential hypertension, benign   Atrial fibrillation (HCC)   GERD (gastroesophageal reflux disease)   Kidney disease, chronic, stage III (GFR 30-59 ml/min)   Diverticulitis  Sepsis likely 2/2 acute diverticulitis, ??Recurrent Currently afebrile, with leukocytosis CT renal stone showed 5 mm left UVJ stone with mild to moderate left-sided hydroureteronephrosis,  new 7.8 x 7.3 cm rounded air-filled structure adjacent to the sigmoid colon, concerning for acute diverticulitis of a giant colonic diverticulum Failed 2 rounds of outpatient oral antibiotic therapy May consult general surgery due to recurrent diverticulitis Continue IV Zosyn Pain management, antiemetics Monitor closely  Left-sided hydroureteronephrosis Noted on CT renal stone, mild to moderate Creatinine better than baseline Pending UA/UC collection May involve urology  CKD stage IIIb Creatinine at baseline Daily BMP  Chronic systolic HF s/p AICD Currently appears euvolemic Continue Coreg 12.5 twice daily Continue to hold diuretics for now  Hypertension Stable Continue to hold home losartan  Paroxysmal A. Fib Currently rate controlled Continue Coreg, amiodarone Continue warfarin for AC  Diabetes mellitus type 2 SSI, Accu-Cheks, hypoglycemic protocol         Malnutrition Type:      Malnutrition Characteristics:      Nutrition Interventions:       Estimated body mass index is 27.96 kg/m as calculated from the following:   Height as of 11/28/17: 5\' 1"  (1.549 m).   Weight as of 06/14/18: 67.1 kg.     Code Status: Full  Family Communication: None at bedside  Disposition Plan: To be determined   Consultants:  None  Procedures:  None  Antimicrobials:  Zosyn  DVT prophylaxis: Warfarin   Objective: Vitals:   12/21/18 1854 12/21/18 2042 12/22/18 0431 12/22/18 1415  BP: 138/73 (!) 118/97 131/70 133/74  Pulse: 79 68 78 84  Resp: 16 18 18 16   Temp: 98.5 F (36.9 C) 98.2 F (36.8 C) 98 F (36.7 C) 98.9 F (37.2 C)  TempSrc: Oral Oral Oral Oral  SpO2: 98% 98% 95% 96%    Intake/Output  Summary (Last 24 hours) at 12/22/2018 1533 Last data filed at 12/22/2018 0600 Gross per 24 hour  Intake 434.5 ml  Output 150 ml  Net 284.5 ml   There were no vitals filed for this visit.  Exam:  General: NAD, chronically ill-appearing, somewhat  confused  Cardiovascular: S1, S2 present  Respiratory: CTAB  Abdomen: Soft, generalized tenderness, mildly distended, bowel sounds present  Musculoskeletal: No bilateral pedal edema noted  Skin: Normal  Psychiatry: Normal mood   Data Reviewed: CBC: Recent Labs  Lab 12/21/18 1102 12/22/18 0348  WBC 12.0* 11.3*  NEUTROABS 11.0*  --   HGB 11.8* 11.5*  HCT 37.9 36.2  MCV 102.4* 102.0*  PLT 323 778   Basic Metabolic Panel: Recent Labs  Lab 12/21/18 1102 12/22/18 0348  NA 141 141  K 4.6 4.6  CL 104 105  CO2 27 23  GLUCOSE 181* 116*  BUN 30* 26*  CREATININE 1.52* 1.25*  CALCIUM 9.1 8.4*   GFR: CrCl cannot be calculated (Unknown ideal weight.). Liver Function Tests: Recent Labs  Lab 12/21/18 1102  AST 15  ALT 7  ALKPHOS 29*  BILITOT 1.1  PROT 6.0*  ALBUMIN 3.1*   No results for input(s): LIPASE, AMYLASE in the last 168 hours. No results for input(s): AMMONIA in the last 168 hours. Coagulation Profile: Recent Labs  Lab 12/20/18 1332 12/21/18 1102 12/22/18 0348  INR 2.3 2.4* 2.6*   Cardiac Enzymes: No results for input(s): CKTOTAL, CKMB, CKMBINDEX, TROPONINI in the last 168 hours. BNP (last 3 results) No results for input(s): PROBNP in the last 8760 hours. HbA1C: Recent Labs    12/21/18 1137  HGBA1C 5.9*   CBG: Recent Labs  Lab 12/21/18 2039 12/22/18 0733 12/22/18 1201  GLUCAP 101* 110* 118*   Lipid Profile: No results for input(s): CHOL, HDL, LDLCALC, TRIG, CHOLHDL, LDLDIRECT in the last 72 hours. Thyroid Function Tests: No results for input(s): TSH, T4TOTAL, FREET4, T3FREE, THYROIDAB in the last 72 hours. Anemia Panel: No results for input(s): VITAMINB12, FOLATE, FERRITIN, TIBC, IRON, RETICCTPCT in the last 72 hours. Urine analysis:    Component Value Date/Time   COLORURINE AMBER (A) 11/11/2014 1200   APPEARANCEUR TURBID (A) 11/11/2014 1200   LABSPEC 1.013 11/11/2014 1200   PHURINE 6.0 11/11/2014 1200   GLUCOSEU NEGATIVE  11/11/2014 1200   GLUCOSEU NEGATIVE 03/14/2012 1142   HGBUR LARGE (A) 11/11/2014 1200   HGBUR negative 11/26/2008 0934   BILIRUBINUR NEGATIVE 11/11/2014 1200   BILIRUBINUR negative 07/21/2012 0909   KETONESUR NEGATIVE 11/11/2014 1200   PROTEINUR 30 (A) 11/11/2014 1200   UROBILINOGEN 0.2 11/11/2014 1200   NITRITE NEGATIVE 11/11/2014 1200   LEUKOCYTESUR MODERATE (A) 11/11/2014 1200   Sepsis Labs: @LABRCNTIP (procalcitonin:4,lacticidven:4)  ) Recent Results (from the past 240 hour(s))  SARS Coronavirus 2 by RT PCR (hospital order, performed in [place] hospital lab) Nasopharyngeal Nasopharyngeal Swab     Status: None   Collection Time: 12/21/18  2:56 PM   Specimen: Nasopharyngeal Swab  Result Value Ref Range Status   SARS Coronavirus 2 NEGATIVE NEGATIVE Final    Comment: (NOTE) If result is NEGATIVE SARS-CoV-2 target nucleic acids are NOT DETECTED. The SARS-CoV-2 RNA is generally detectable in upper and lower  respiratory specimens during the acute phase of infection. The lowest  concentration of SARS-CoV-2 viral copies this assay can detect is 250  copies / mL. A negative result does not preclude SARS-CoV-2 infection  and should not be used as the sole basis for treatment or other  patient management decisions.  A negative result may occur with  improper specimen collection / handling, submission of specimen other  than nasopharyngeal swab, presence of viral mutation(s) within the  areas targeted by this assay, and inadequate number of viral copies  (<250 copies / mL). A negative result must be combined with clinical  observations, patient history, and epidemiological information. If result is POSITIVE SARS-CoV-2 target nucleic acids are DETECTED. The SARS-CoV-2 RNA is generally detectable in upper and lower  respiratory specimens dur ing the acute phase of infection.  Positive  results are indicative of active infection with SARS-CoV-2.  Clinical  correlation with patient  history and other diagnostic information is  necessary to determine patient infection status.  Positive results do  not rule out bacterial infection or co-infection with other viruses. If result is PRESUMPTIVE POSTIVE SARS-CoV-2 nucleic acids MAY BE PRESENT.   A presumptive positive result was obtained on the submitted specimen  and confirmed on repeat testing.  While 2019 novel coronavirus  (SARS-CoV-2) nucleic acids may be present in the submitted sample  additional confirmatory testing may be necessary for epidemiological  and / or clinical management purposes  to differentiate between  SARS-CoV-2 and other Sarbecovirus currently known to infect humans.  If clinically indicated additional testing with an alternate test  methodology [phone]) is advised. The SARS-CoV-2 RNA is generally  detectable in upper and lower respiratory sp ecimens during the acute  phase of infection. The expected result is Negative. Fact Sheet for Patients:  StrictlyIdeas.no Fact Sheet for Healthcare Providers: BankingDealers.co.za This test is not yet approved or cleared by the [place] FDA and has been authorized for detection and/or diagnosis of SARS-CoV-2 by FDA under an Emergency Use Authorization (EUA).  This EUA will remain in effect (meaning this test can be used) for the duration of the COVID-19 declaration under Section 564(b)(1) of the Act, 21 U.S.C. section 360bbb-3(b)(1), unless the authorization is terminated or revoked sooner. Performed at [facility], [place] [address] [place], [place] [address]       Studies: No results found.  Scheduled Meds: . amiodarone  100 mg Oral Daily  . carvedilol  12.5 mg Oral BID WC  . insulin aspart  0-9 Units Subcutaneous TID WC  . vitamin B-12  2,500 mcg Oral Q M,W,F  . warfarin  1 mg Oral ONCE-1800  . Warfarin - Pharmacist Dosing Inpatient   Does not apply q1800    Continuous  Infusions: . lactated ringers 75 mL/hr at 12/22/18 1457  . piperacillin-tazobactam (ZOSYN)  IV 3.375 g (12/22/18 1457)     LOS: 1 day     Alma Friendly, MD [facility]  If 7PM-7AM, please contact night-coverage [email or web address] 12/22/2018, 3:33 PM

## 2018-12-23 ENCOUNTER — Inpatient Hospital Stay (HOSPITAL_COMMUNITY): Payer: Medicare Other

## 2018-12-23 LAB — BASIC METABOLIC PANEL
Anion gap: 12 (ref 5–15)
BUN: 23 mg/dL (ref 8–23)
CO2: 24 mmol/L (ref 22–32)
Calcium: 8.1 mg/dL — ABNORMAL LOW (ref 8.9–10.3)
Chloride: 106 mmol/L (ref 98–111)
Creatinine, Ser: 1.28 mg/dL — ABNORMAL HIGH (ref 0.44–1.00)
GFR calc Af Amer: 47 mL/min — ABNORMAL LOW (ref >60)
GFR calc non Af Amer: 40 mL/min — ABNORMAL LOW (ref >60)
Glucose, Bld: 99 mg/dL (ref 70–99)
Potassium: 3.9 mmol/L (ref 3.5–5.1)
Sodium: 142 mmol/L (ref 135–145)

## 2018-12-23 LAB — CBC WITH DIFFERENTIAL/PLATELET
Abs Immature Granulocytes: 0.02 10*3/uL (ref 0.00–0.07)
Basophils Absolute: 0 10*3/uL (ref 0.0–0.1)
Basophils Relative: 0 %
Eosinophils Absolute: 0.1 10*3/uL (ref 0.0–0.5)
Eosinophils Relative: 1 %
HCT: 34.2 % — ABNORMAL LOW (ref 36.0–46.0)
Hemoglobin: 10.5 g/dL — ABNORMAL LOW (ref 12.0–15.0)
Immature Granulocytes: 0 %
Lymphocytes Relative: 4 %
Lymphs Abs: 0.4 10*3/uL — ABNORMAL LOW (ref 0.7–4.0)
MCH: 31.4 pg (ref 26.0–34.0)
MCHC: 30.7 g/dL (ref 30.0–36.0)
MCV: 102.4 fL — ABNORMAL HIGH (ref 80.0–100.0)
Monocytes Absolute: 0.6 10*3/uL (ref 0.1–1.0)
Monocytes Relative: 7 %
Neutro Abs: 7.4 10*3/uL (ref 1.7–7.7)
Neutrophils Relative %: 88 %
Platelets: 276 10*3/uL (ref 150–400)
RBC: 3.34 MIL/uL — ABNORMAL LOW (ref 3.87–5.11)
RDW: 14.6 % (ref 11.5–15.5)
WBC: 8.5 10*3/uL (ref 4.0–10.5)
nRBC: 0 % (ref 0.0–0.2)

## 2018-12-23 LAB — PROTIME-INR
INR: 3.6 — ABNORMAL HIGH (ref 0.8–1.2)
Prothrombin Time: 35.3 seconds — ABNORMAL HIGH (ref 11.4–15.2)

## 2018-12-23 LAB — GLUCOSE, CAPILLARY
Glucose-Capillary: 129 mg/dL — ABNORMAL HIGH (ref 70–99)
Glucose-Capillary: 123 mg/dL — ABNORMAL HIGH (ref 70–99)
Glucose-Capillary: 117 mg/dL — ABNORMAL HIGH (ref 70–99)
Glucose-Capillary: 95 mg/dL (ref 70–99)

## 2018-12-23 MED ORDER — SODIUM CHLORIDE 0.9 % IV BOLUS
1000.0000 mL | Freq: Once | INTRAVENOUS | Status: AC
  Administered 2018-12-23: 1000 mL via INTRAVENOUS

## 2018-12-23 MED ORDER — SODIUM CHLORIDE 0.9 % IV SOLN
INTRAVENOUS | Status: DC
  Administered 2018-12-23 – 2018-12-25 (×5): via INTRAVENOUS

## 2018-12-23 NOTE — Progress Notes (Addendum)
[place] for warfarin Indication: Afib, mechanical aortic valve  Allergies  Allergen Reactions  . Metformin And Related     Renal issues  . Tikosyn [Dofetilide]     Passed out  . Caffeine Other (See Comments)    Affects heart    Patient Measurements: Weight: 118 lb 2.7 oz (53.6 kg)  Vital Signs: Temp: 97.7 F (36.5 C) (10/17 0548) Temp Source: Oral (10/17 0548) BP: 123/66 (10/17 0548) Pulse Rate: 73 (10/17 0548)  Labs: Recent Labs    12/21/18 1102 12/21/18 1349 12/22/18 0348 12/23/18 0350  HGB 11.8*  --  11.5* 10.5*  HCT 37.9  --  36.2 34.2*  PLT 323  --  324 276  LABPROT 25.5*  --  27.4* 35.3*  INR 2.4*  --  2.6* 3.6*  CREATININE 1.52*  --  1.25* 1.28*  TROPONINIHS 7 9  --   --     CrCl cannot be calculated (Unknown ideal weight.).  Assessment: 37 yoF with PMH Afib and aortic valve replacement on warfarin PTA, HFrEF (35%) s/p AICD, DM2, HTN, diverticulosis, anemia, presents with diarrhea and abdominal pain. Recently treated for diverticulitis with 2 courses of abx; current CT shows persistent diverticulitis of giant colonic diverticulum. Admitted for IV abx; pharmacy consulted to resume warfarin while admitted.   Baseline INR therapeutic  Prior anticoagulation: warfarin 1.5 mg daily except 1 mg Thurs & Sun, LD 10/14  Significant events:  Today, 12/23/2018:  CBC: Hgb slightly low and slowly falling, Plts WNL and steady  INR rose markedly overnight; now supratherapeutic  Major drug interactions: amiodarone PTA (chronic med); broad-spectrum abx  No bleeding issues per nursing  CLD; intake not reported  Goal of Therapy: INR 2-3  Plan:  Hold warfarin  Daily INR  CBC at least q72 hr while on warfarin  Monitor for signs of bleeding or thrombosis   Reuel Boom, PharmD, BCPS [phone] 12/23/2018, 11:50 AM

## 2018-12-23 NOTE — Progress Notes (Signed)
PROGRESS NOTE  [name] XQJ:[id number] DOB: 12/12/1940 DOA: 12/21/2018 PCP: Velna Hatchet, MD  HPI/Recap of past 24 hours: HPI from Dr Cathlean Sauer [name] is a 78 y.o. female with medical history significant of ischemic cardiomyopathy with ejection fraction 35% status post AICD 2014, atrial fibrillation status post maze 2008, type 2 diabetes mellitus, hypertension, diverticulosis, arthritis and anemia. Patient has been not feeling well for the past 3 weeks, consistent with diarrhea and abdominal pain.  She was diagnosed as an outpatient with diverticulitis, and she had 2 rounds of antibiotics, one course for 7 days and the other for about 14 days with no improvement of her symptoms.  At home she continued to have severe diarrhea, multiple times per day, watery, associated with poor oral intake, generalized weakness. No improving or worsening factors. Patient has been self quarantine and has not been in contact with other people beside her husband. In the ED, patient was noted to be tachycardic, mildly hypotensive, labs showed creatinine was 1.52 and her white cell count 12.0.  Abdominal CT showed diverticulitis of a giant colonic diverticulum.  Patient admitted for further management.    Today, patient noted to have poor oral intake, stating she does not feel like eating.  Noted to also have poor urine output, IV fluids were restarted.  Patient's diet advanced to full liquid for better options.  Patient denies any worsening abdominal pain, nausea/vomiting, diarrhea, fever/chills, chest pain, shortness of breath, any obvious signs of bleeding except chronic hematuria noted.   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  No perforation or abscess noted May consult general surgery, if abdominal pain worsens, or vital signs are unstable Continue IV Zosyn Due to poor oral intake, restarted IV fluids, advance diet to full liquid for better options, dietitian consulted Pain management, antiemetics Monitor closely  Left-sided hydroureteronephrosis Noted on CT renal stone, mild to moderate, noted to have numerous nonobstructing renal stones No significant flank pain noted Creatinine better than baseline UA showed large blood (supratherapeutic INR), UC pending Renal ultrasound pending to re-evaluate hydronephrosis  Supratherapeutic INR Pharmacy to adjust warfarin dosing  CKD stage IIIb Creatinine at baseline Daily BMP  Chronic systolic HF s/p AICD Currently appears euvolemic Continue Coreg 12.5 twice daily Continue to hold diuretics for now  Hypertension Stable Continue to hold home losartan  Paroxysmal A. Fib Currently rate controlled Continue Coreg, amiodarone Warfarin for AC per pharmacy  Diabetes mellitus type 2 SSI, Accu-Cheks, hypoglycemic protocol         Malnutrition Type:      Malnutrition Characteristics:      Nutrition Interventions:       Estimated body mass index is 19.07 kg/m as calculated from the following:   Height as of this encounter: 5\' 6"  (1.676 m).   Weight as of this encounter: 53.6 kg.     Code Status: Full  Family Communication: None at bedside  Disposition Plan: To be determined   Consultants:  None  Procedures:  None  Antimicrobials:  Zosyn  DVT prophylaxis: Warfarin   Objective: Vitals:   12/23/18 0548 12/23/18 0833 12/23/18 1350  12/23/18 1410  BP: 123/66  124/68   Pulse: 73  70   Resp: 17  18   Temp: 97.7 F (36.5 C)  97.6 F (36.4 C)   TempSrc: Oral  Oral   SpO2: 99%  100%   Weight:  53.6 kg    Height:    5\' 6"  (1.676 m)    Intake/Output Summary (Last 24 hours) at 12/23/2018 1504 Last data filed at 12/23/2018 0700 Gross per 24 hour  Intake 954.06 ml  Output 380 ml  Net 574.06 ml   Filed Weights   12/23/18 0833  Weight: 53.6 kg    Exam:  General: NAD, chronically ill-appearing  Cardiovascular: S1, S2 present  Respiratory: CTAB  Abdomen: Soft, nontender, mildly distended, bowel sounds present  Musculoskeletal: No bilateral pedal edema noted  Skin: Normal  Psychiatry: Normal mood   Data Reviewed: CBC: Recent Labs  Lab 12/21/18 1102 12/22/18 0348 12/23/18 0350  WBC 12.0* 11.3* 8.5  NEUTROABS 11.0*  --  7.4  HGB 11.8* 11.5* 10.5*  HCT 37.9 36.2 34.2*  MCV 102.4* 102.0* 102.4*  PLT 323 324 626   Basic Metabolic Panel: Recent Labs  Lab 12/21/18 1102 12/22/18 0348 12/23/18 0350  NA 141 141 142  K 4.6 4.6 3.9  CL 104 105 106  CO2 27 23 24   GLUCOSE 181* 116* 99  BUN 30* 26* 23  CREATININE 1.52* 1.25* 1.28*  CALCIUM 9.1 8.4* 8.1*   GFR: Estimated Creatinine Clearance: 31.1 mL/min (A) (by C-G formula based on SCr of 1.28 mg/dL (H)). Liver Function Tests: Recent Labs  Lab 12/21/18 1102  AST 15  ALT 7  ALKPHOS 29*  BILITOT 1.1  PROT 6.0*  ALBUMIN 3.1*   No results for input(s): LIPASE, AMYLASE in the last 168 hours. No results for input(s): AMMONIA in the last 168 hours. Coagulation Profile: Recent Labs  Lab 12/20/18 1332 12/21/18 1102 12/22/18 0348 12/23/18 0350  INR 2.3 2.4* 2.6* 3.6*   Cardiac Enzymes: No results for input(s): CKTOTAL, CKMB, CKMBINDEX, TROPONINI in the last 168 hours. BNP (last 3 results) No results for input(s): PROBNP in the last 8760 hours. HbA1C: Recent Labs    12/21/18 1137  HGBA1C 5.9*   CBG: Recent Labs  Lab 12/22/18  1201 12/22/18 1632 12/22/18 2041 12/23/18 0739 12/23/18 1203  GLUCAP 118* 105* 102* 129* 123*   Lipid Profile: No results for input(s): CHOL, HDL, LDLCALC, TRIG, CHOLHDL, LDLDIRECT in the last 72 hours. Thyroid Function Tests: No results for input(s): TSH, T4TOTAL, FREET4, T3FREE, THYROIDAB in the last 72 hours. Anemia Panel: No results for input(s): VITAMINB12, FOLATE, FERRITIN, TIBC, IRON, RETICCTPCT in the last 72 hours. Urine analysis:    Component Value Date/Time   COLORURINE YELLOW 12/22/2018 1649   APPEARANCEUR HAZY (A) 12/22/2018 1649   LABSPEC 1.011 12/22/2018 1649   PHURINE 5.0 12/22/2018 1649   GLUCOSEU NEGATIVE 12/22/2018 1649   GLUCOSEU NEGATIVE 03/14/2012 1142   HGBUR LARGE (A) 12/22/2018 1649   HGBUR negative 11/26/2008 0934   BILIRUBINUR NEGATIVE 12/22/2018 1649   BILIRUBINUR negative 07/21/2012 0909   KETONESUR NEGATIVE 12/22/2018 1649   PROTEINUR NEGATIVE 12/22/2018 1649   UROBILINOGEN 0.2 11/11/2014 1200   NITRITE NEGATIVE 12/22/2018 1649   LEUKOCYTESUR NEGATIVE 12/22/2018 1649   Sepsis Labs: @LABRCNTIP (procalcitonin:4,lacticidven:4)  ) Recent Results (from the past 240 hour(s))  SARS Coronavirus 2 by RT PCR (hospital order, performed in [place] hospital  lab) Nasopharyngeal Nasopharyngeal Swab     Status: None   Collection Time: 12/21/18  2:56 PM   Specimen: Nasopharyngeal Swab  Result Value Ref Range Status   SARS Coronavirus 2 NEGATIVE NEGATIVE Final    Comment: (NOTE) If result is NEGATIVE SARS-CoV-2 target nucleic acids are NOT DETECTED. The SARS-CoV-2 RNA is generally detectable in upper and lower  respiratory specimens during the acute phase of infection. The lowest  concentration of SARS-CoV-2 viral copies this assay can detect is 250  copies / mL. A negative result does not preclude SARS-CoV-2 infection  and should not be used as the sole basis for treatment or other  patient management decisions.  A negative result may occur with   improper specimen collection / handling, submission of specimen other  than nasopharyngeal swab, presence of viral mutation(s) within the  areas targeted by this assay, and inadequate number of viral copies  (<250 copies / mL). A negative result must be combined with clinical  observations, patient history, and epidemiological information. If result is POSITIVE SARS-CoV-2 target nucleic acids are DETECTED. The SARS-CoV-2 RNA is generally detectable in upper and lower  respiratory specimens dur ing the acute phase of infection.  Positive  results are indicative of active infection with SARS-CoV-2.  Clinical  correlation with patient history and other diagnostic information is  necessary to determine patient infection status.  Positive results do  not rule out bacterial infection or co-infection with other viruses. If result is PRESUMPTIVE POSTIVE SARS-CoV-2 nucleic acids MAY BE PRESENT.   A presumptive positive result was obtained on the submitted specimen  and confirmed on repeat testing.  While 2019 novel coronavirus  (SARS-CoV-2) nucleic acids may be present in the submitted sample  additional confirmatory testing may be necessary for epidemiological  and / or clinical management purposes  to differentiate between  SARS-CoV-2 and other Sarbecovirus currently known to infect humans.  If clinically indicated additional testing with an alternate test  methodology [phone]) is advised. The SARS-CoV-2 RNA is generally  detectable in upper and lower respiratory sp ecimens during the acute  phase of infection. The expected result is Negative. Fact Sheet for Patients:  StrictlyIdeas.no Fact Sheet for Healthcare Providers: BankingDealers.co.za This test is not yet approved or cleared by the [place] FDA and has been authorized for detection and/or diagnosis of SARS-CoV-2 by FDA under an Emergency Use Authorization (EUA).  This EUA will  remain in effect (meaning this test can be used) for the duration of the COVID-19 declaration under Section 564(b)(1) of the Act, 21 U.S.C. section 360bbb-3(b)(1), unless the authorization is terminated or revoked sooner. Performed at [facility], [place] [address] [place], [place] [address]       Studies: No results found.  Scheduled Meds: . amiodarone  100 mg Oral Daily  . carvedilol  12.5 mg Oral BID WC  . insulin aspart  0-9 Units Subcutaneous TID WC  . vitamin B-12  2,500 mcg Oral Q M,W,F  . Warfarin - Pharmacist Dosing Inpatient   Does not apply q1800    Continuous Infusions: . sodium chloride 75 mL/hr at 12/23/18 1239  . piperacillin-tazobactam (ZOSYN)  IV 3.375 g (12/23/18 1349)     LOS: 2 days     Alma Friendly, MD [facility]  If 7PM-7AM, please contact night-coverage [email or web address] 12/23/2018, 3:04 PM

## 2018-12-23 NOTE — Progress Notes (Signed)
In and out catheter drained 100cc's of brown urine with sediment.  This was the only unine output for entire shift.  MD made aware, 1000cc bolus ordered.

## 2018-12-24 LAB — CBC WITH DIFFERENTIAL/PLATELET
Abs Immature Granulocytes: 0.05 10*3/uL (ref 0.00–0.07)
Basophils Absolute: 0 10*3/uL (ref 0.0–0.1)
Basophils Relative: 0 %
Eosinophils Absolute: 0.1 10*3/uL (ref 0.0–0.5)
Eosinophils Relative: 2 %
HCT: 35.3 % — ABNORMAL LOW (ref 36.0–46.0)
Hemoglobin: 10.8 g/dL — ABNORMAL LOW (ref 12.0–15.0)
Immature Granulocytes: 1 %
Lymphocytes Relative: 6 %
Lymphs Abs: 0.5 10*3/uL — ABNORMAL LOW (ref 0.7–4.0)
MCH: 31.9 pg (ref 26.0–34.0)
MCHC: 30.6 g/dL (ref 30.0–36.0)
MCV: 104.1 fL — ABNORMAL HIGH (ref 80.0–100.0)
Monocytes Absolute: 0.7 10*3/uL (ref 0.1–1.0)
Monocytes Relative: 8 %
Neutro Abs: 6.9 10*3/uL (ref 1.7–7.7)
Neutrophils Relative %: 83 %
Platelets: 260 10*3/uL (ref 150–400)
RBC: 3.39 MIL/uL — ABNORMAL LOW (ref 3.87–5.11)
RDW: 15 % (ref 11.5–15.5)
WBC: 8.2 10*3/uL (ref 4.0–10.5)
nRBC: 0 % (ref 0.0–0.2)

## 2018-12-24 LAB — BASIC METABOLIC PANEL
Anion gap: 10 (ref 5–15)
BUN: 22 mg/dL (ref 8–23)
CO2: 21 mmol/L — ABNORMAL LOW (ref 22–32)
Calcium: 7.6 mg/dL — ABNORMAL LOW (ref 8.9–10.3)
Chloride: 112 mmol/L — ABNORMAL HIGH (ref 98–111)
Creatinine, Ser: 1.24 mg/dL — ABNORMAL HIGH (ref 0.44–1.00)
GFR calc Af Amer: 49 mL/min — ABNORMAL LOW (ref >60)
GFR calc non Af Amer: 42 mL/min — ABNORMAL LOW (ref >60)
Glucose, Bld: 96 mg/dL (ref 70–99)
Potassium: 3.9 mmol/L (ref 3.5–5.1)
Sodium: 143 mmol/L (ref 135–145)

## 2018-12-24 LAB — URINE CULTURE: Culture: NO GROWTH

## 2018-12-24 LAB — PROTIME-INR
INR: 3.6 — ABNORMAL HIGH (ref 0.8–1.2)
Prothrombin Time: 35.7 seconds — ABNORMAL HIGH (ref 11.4–15.2)

## 2018-12-24 LAB — GLUCOSE, CAPILLARY
Glucose-Capillary: 98 mg/dL (ref 70–99)
Glucose-Capillary: 147 mg/dL — ABNORMAL HIGH (ref 70–99)
Glucose-Capillary: 108 mg/dL — ABNORMAL HIGH (ref 70–99)
Glucose-Capillary: 167 mg/dL — ABNORMAL HIGH (ref 70–99)

## 2018-12-24 MED ORDER — SODIUM CHLORIDE 0.9 % IV BOLUS
1000.0000 mL | Freq: Once | INTRAVENOUS | Status: AC
  Administered 2018-12-24: 1000 mL via INTRAVENOUS

## 2018-12-24 NOTE — Progress Notes (Signed)
[place] for warfarin Indication: Afib, mechanical aortic valve  Allergies  Allergen Reactions  . Metformin And Related     Renal issues  . Tikosyn [Dofetilide]     Passed out  . Caffeine Other (See Comments)    Affects heart    Patient Measurements: Height: 5\' 6"  (167.6 cm) Weight: 118 lb 2.7 oz (53.6 kg) IBW/kg (Calculated) : 59.3  Vital Signs: Temp: 97.9 F (36.6 C) (10/18 0433) Temp Source: Oral (10/18 0433) BP: 115/67 (10/18 0433) Pulse Rate: 67 (10/18 0433)  Labs: Recent Labs    12/21/18 1349  12/22/18 0348 12/23/18 0350 12/24/18 0402  HGB  --    < > 11.5* 10.5* 10.8*  HCT  --   --  36.2 34.2* 35.3*  PLT  --   --  324 276 260  LABPROT  --   --  27.4* 35.3* 35.7*  INR  --   --  2.6* 3.6* 3.6*  CREATININE  --   --  1.25* 1.28* 1.24*  TROPONINIHS 9  --   --   --   --    < > = values in this interval not displayed.    Estimated Creatinine Clearance: 32.1 mL/min (A) (by C-G formula based on SCr of 1.24 mg/dL (H)).  Assessment: 49 yoF with PMH Afib and aortic valve replacement on warfarin PTA, HFrEF (35%) s/p AICD, DM2, HTN, diverticulosis, anemia, presents with diarrhea and abdominal pain. Recently treated for diverticulitis with 2 courses of abx; current CT shows persistent diverticulitis of giant colonic diverticulum. Admitted for IV abx; pharmacy consulted to resume warfarin while admitted.   Baseline INR therapeutic  Prior anticoagulation: warfarin 1.5 mg daily except 1 mg Thurs & Sun, LD 10/14  Significant events:  Today, 12/24/2018:  CBC: Hgb slightly low and slowly falling, Plts WNL and steady  INR rose markedly yesterday; still supratherapeutic but stable today  Major drug interactions: amiodarone PTA (chronic med); broad-spectrum abx  No bleeding issues per nursing  Advancing diet to promote PO intake  Goal of Therapy: INR 2-3  Plan:  Continue to hold warfarin  Daily INR  CBC at least q72 hr  while on warfarin  Monitor for signs of bleeding or thrombosis   Reuel Boom, PharmD, BCPS [phone] 12/24/2018, 1:09 PM

## 2018-12-24 NOTE — Progress Notes (Signed)
PROGRESS NOTE    Karen LEZOTTE  XTG:[id number] DOB: 1940-06-06 DOA: 12/21/2018 PCP: Velna Hatchet, MD   Brief Narrative:  Patient is a 78 year old female with history of ischemic cardiomyopathy with ejection fraction of 35%, status post AICD, A. fib status post maze procedure in thousand 8, diabetes mellitus, hypertension, diverticulosis, arthritis who presented with abdomen pain, diarrhea.  She was diagnosed with diverticulitis as an outpatient and was on 2 rounds of antibiotics was did not help.  She presented to the emergency department  because she continued to have severe diarrhea,  poor oral intake, generalized weakness.  Abdominal CT done in the emergency department showed diverticulitis of a giant diverticulum.  She was admitted for further management. Started on IV antibiotic. Currently she is clinically improving  Assessment & Plan:   Principal Problem:   Sepsis ([place]) Active Problems:   Essential hypertension, benign   Atrial fibrillation (HCC)   GERD (gastroesophageal reflux disease)   Kidney disease, chronic, stage III (GFR 30-59 ml/min)   Diverticulitis   Acute diverticulitis: Clinically improving, afebrile, hemodynamically stable.  Leukocytosis resolved.  CT imaging showed 7.8 x 7.3 cm rounded air-filled structure adjacent to the sigmoid colon, concerning for acute diverticulitis of a giant colonic diverticulum.  No perforation or abscess noted.  Since she is clinically improving, general surgery not consulted.  Continue IV antibiotics for now.  Currently she is on full liquid diet.  Abdominal pain, nausea and vomiting have improved.  Currently on gentle IV fluids.  Continue pain management, antiemetics.  History of cholelithiasis/left-sided hydroureteronephrosis: ultrasound showed mild hydronephrosis of the left kidney, 15 mm left kidney stone, 5.5 centimeter right renal cyst.  She follows with Dr. Lovena Neighbours.  We recommend follow-up with urology as an outpatient.  CKD stage  III: Currently kidney function is at baseline.  Supratherapeutic INR:  Takes warfarin for anticoagulation for A. fib.  Currently INR is subtherapeutic.  Continue to monitor INR.  Paroxysmal A. fib: Currently normal sinus rhythm.  Rate is controlled.  Continue Coreg, amiodarone.  On warfarin for anticoagulation at home.  Chronic systolic heart failure: Status post AICD.  Currently following.  Continue gentle IV fluids for diverticulitis.  On Coreg.  Diuretics on hold  Hypertension: Currently blood pressure stable.  Continue to hold home medicines for blood pressure.  Diabetes type 2: Continue sliding scale insulin.  Debility/deconditioning: She has been seen by physical therapy and recommended skilled nursing facility on discharge.  Social worker consulted.           DVT prophylaxis:Supratherapeutic INR Code Status: Full Family Communication: Husband present at the bedside Disposition Plan: Skilled nursing facility after further clinical improvement   Consultants: None  Procedures: None  Antimicrobials:  Anti-infectives (From admission, onward)   Start     Dose/Rate Route Frequency Ordered Stop   12/21/18 2200  piperacillin-tazobactam (ZOSYN) IVPB 3.375 g     3.375 g 12.5 mL/hr over 240 Minutes Intravenous Every 8 hours 12/21/18 1642     12/21/18 2000  piperacillin-tazobactam (ZOSYN) IVPB 3.375 g  Status:  Discontinued     3.375 g 100 mL/hr over 30 Minutes Intravenous Every 6 hours 12/21/18 1953 12/21/18 1955   12/21/18 1445  piperacillin-tazobactam (ZOSYN) IVPB 3.375 g     3.375 g 12.5 mL/hr over 240 Minutes Intravenous  Once 12/21/18 1431 12/21/18 1937      Subjective:  Patient seen and examined the bedside this morning.  Currently looks comfortable but hemodynamically stable ,tolerating diet.  Denies any abdomen pain, nausea  or vomiting.  Objective: Vitals:   12/23/18 1350 12/23/18 1410 12/23/18 2048 12/24/18 0433  BP: 124/68  111/64 115/67  Pulse: 70  70 67   Resp: 18  18 18   Temp: 97.6 F (36.4 C)  97.8 F (36.6 C) 97.9 F (36.6 C)  TempSrc: Oral  Oral Oral  SpO2: 100%  95% 95%  Weight:      Height:  5\' 6"  (1.676 m)      Intake/Output Summary (Last 24 hours) at 12/24/2018 1208 Last data filed at 12/24/2018 1009 Gross per 24 hour  Intake 2973.26 ml  Output 100 ml  Net 2873.26 ml   Filed Weights   12/23/18 0833  Weight: 53.6 kg    Examination:  General exam: Appears calm and comfortable ,Not in distress, pleasant elderly female  HEENT:PERRL,Oral mucosa moist, Ear/Nose normal on gross exam Respiratory system: Bilateral equal air entry, normal vesicular breath sounds, no wheezes or crackles  Cardiovascular system: S1 & S2 heard, RRR. No JVD, murmurs, rubs, gallops or clicks. No pedal edema. Gastrointestinal system: Abdomen is nondistended, soft and very mild generalized tenderness. No organomegaly or masses felt. Normal bowel sounds heard. Central nervous system: Alert and oriented. No focal neurological deficits. Extremities: No edema, no clubbing ,no cyanosis, distal peripheral pulses palpable. Skin: No rashes, lesions or ulcers,no icterus ,no pallor   Data Reviewed: I have personally reviewed following labs and imaging studies  CBC: Recent Labs  Lab 12/21/18 1102 12/22/18 0348 12/23/18 0350 12/24/18 0402  WBC 12.0* 11.3* 8.5 8.2  NEUTROABS 11.0*  --  7.4 6.9  HGB 11.8* 11.5* 10.5* 10.8*  HCT 37.9 36.2 34.2* 35.3*  MCV 102.4* 102.0* 102.4* 104.1*  PLT 323 324 276 263   Basic Metabolic Panel: Recent Labs  Lab 12/21/18 1102 12/22/18 0348 12/23/18 0350 12/24/18 0402  NA 141 141 142 143  K 4.6 4.6 3.9 3.9  CL 104 105 106 112*  CO2 27 23 24  21*  GLUCOSE 181* 116* 99 96  BUN 30* 26* 23 22  CREATININE 1.52* 1.25* 1.28* 1.24*  CALCIUM 9.1 8.4* 8.1* 7.6*   GFR: Estimated Creatinine Clearance: 32.1 mL/min (A) (by C-G formula based on SCr of 1.24 mg/dL (H)). Liver Function Tests: Recent Labs  Lab 12/21/18 1102   AST 15  ALT 7  ALKPHOS 29*  BILITOT 1.1  PROT 6.0*  ALBUMIN 3.1*   No results for input(s): LIPASE, AMYLASE in the last 168 hours. No results for input(s): AMMONIA in the last 168 hours. Coagulation Profile: Recent Labs  Lab 12/20/18 1332 12/21/18 1102 12/22/18 0348 12/23/18 0350 12/24/18 0402  INR 2.3 2.4* 2.6* 3.6* 3.6*   Cardiac Enzymes: No results for input(s): CKTOTAL, CKMB, CKMBINDEX, TROPONINI in the last 168 hours. BNP (last 3 results) No results for input(s): PROBNP in the last 8760 hours. HbA1C: No results for input(s): HGBA1C in the last 72 hours. CBG: Recent Labs  Lab 12/23/18 1203 12/23/18 1637 12/23/18 2046 12/24/18 0832 12/24/18 1139  GLUCAP 123* 117* 95 98 147*   Lipid Profile: No results for input(s): CHOL, HDL, LDLCALC, TRIG, CHOLHDL, LDLDIRECT in the last 72 hours. Thyroid Function Tests: No results for input(s): TSH, T4TOTAL, FREET4, T3FREE, THYROIDAB in the last 72 hours. Anemia Panel: No results for input(s): VITAMINB12, FOLATE, FERRITIN, TIBC, IRON, RETICCTPCT in the last 72 hours. Sepsis Labs: No results for input(s): PROCALCITON, LATICACIDVEN in the last 168 hours.  Recent Results (from the past 240 hour(s))  SARS Coronavirus 2 by RT PCR (hospital order, performed  in [facility] lab) Nasopharyngeal Nasopharyngeal Swab     Status: None   Collection Time: 12/21/18  2:56 PM   Specimen: Nasopharyngeal Swab  Result Value Ref Range Status   SARS Coronavirus 2 NEGATIVE NEGATIVE Final    Comment: (NOTE) If result is NEGATIVE SARS-CoV-2 target nucleic acids are NOT DETECTED. The SARS-CoV-2 RNA is generally detectable in upper and lower  respiratory specimens during the acute phase of infection. The lowest  concentration of SARS-CoV-2 viral copies this assay can detect is 250  copies / mL. A negative result does not preclude SARS-CoV-2 infection  and should not be used as the sole basis for treatment or other  patient management  decisions.  A negative result may occur with  improper specimen collection / handling, submission of specimen other  than nasopharyngeal swab, presence of viral mutation(s) within the  areas targeted by this assay, and inadequate number of viral copies  (<250 copies / mL). A negative result must be combined with clinical  observations, patient history, and epidemiological information. If result is POSITIVE SARS-CoV-2 target nucleic acids are DETECTED. The SARS-CoV-2 RNA is generally detectable in upper and lower  respiratory specimens dur ing the acute phase of infection.  Positive  results are indicative of active infection with SARS-CoV-2.  Clinical  correlation with patient history and other diagnostic information is  necessary to determine patient infection status.  Positive results do  not rule out bacterial infection or co-infection with other viruses. If result is PRESUMPTIVE POSTIVE SARS-CoV-2 nucleic acids MAY BE PRESENT.   A presumptive positive result was obtained on the submitted specimen  and confirmed on repeat testing.  While 2019 novel coronavirus  (SARS-CoV-2) nucleic acids may be present in the submitted sample  additional confirmatory testing may be necessary for epidemiological  and / or clinical management purposes  to differentiate between  SARS-CoV-2 and other Sarbecovirus currently known to infect humans.  If clinically indicated additional testing with an alternate test  methodology [phone]) is advised. The SARS-CoV-2 RNA is generally  detectable in upper and lower respiratory sp ecimens during the acute  phase of infection. The expected result is Negative. Fact Sheet for Patients:  StrictlyIdeas.no Fact Sheet for Healthcare Providers: BankingDealers.co.za This test is not yet approved or cleared by the [place] FDA and has been authorized for detection and/or diagnosis of SARS-CoV-2 by FDA under an  Emergency Use Authorization (EUA).  This EUA will remain in effect (meaning this test can be used) for the duration of the COVID-19 declaration under Section 564(b)(1) of the Act, 21 U.S.C. section 360bbb-3(b)(1), unless the authorization is terminated or revoked sooner. Performed at [facility], [place] [address] [place], [place] [address]   Culture, Urine     Status: None   Collection Time: 12/22/18  4:49 PM   Specimen: Urine, Catheterized  Result Value Ref Range Status   Specimen Description   Final    URINE, CATHETERIZED Performed at [place] [address], [place], [place] [address]    Special Requests   Final    NONE Performed at [facility], [place] [address] [place], [place] [address]    Culture   Final    NO GROWTH Performed at [facility], [place] [address] [place], [place] [address]    Report Status 12/24/2018 FINAL  Final         Radiology Studies: US Renal  Result Date: 12/23/2018 CLINICAL DATA:  Evaluate left-sided  hydronephrosis. EXAM: RENAL / URINARY TRACT ULTRASOUND COMPLETE COMPARISON:  CT scan December 21, 2018 FINDINGS: Right Kidney: Renal measurements: 9.7 x 3.9 x 4.1 cm = volume: 81.9 mL. A 5.5 cm cyst was measured on the right. No solid masses identified. A 7 mm stone is noted. No hydronephrosis. Left Kidney: Renal measurements: 9.9 x 3.6 x 3.6 cm = volume: 65.5 mL. Mild hydronephrosis. Left-sided stones are seen. The largest stone measures 15 mm. Bladder: Appears normal for degree of bladder distention. Other: Calcified fibroids are seen in the uterus. IMPRESSION: 1. Mild hydronephrosis remains on the left. Stones are seen in the left kidney. The largest stone measures 15 mm. 2. No hydronephrosis on the right. There is a 7 mm stone on the right. 3. A 5.5 cm cyst was measured on the right. The etiology of this finding is unclear as there is no right renal cyst on the CT scan from December 21, 2018. 4. Fibroid uterus. Electronically Signed   By: Dorise Bullion III M.D   On: 12/23/2018 17:30        Scheduled Meds: . amiodarone  100 mg Oral Daily  . carvedilol  12.5 mg Oral BID WC  . insulin aspart  0-9 Units Subcutaneous TID WC  . vitamin B-12  2,500 mcg Oral Q M,W,F  . Warfarin - Pharmacist Dosing Inpatient   Does not apply q1800   Continuous Infusions: . sodium chloride 100 mL/hr at 12/24/18 0640  . piperacillin-tazobactam (ZOSYN)  IV 3.375 g (12/24/18 0657)     LOS: 3 days    Time spent:35 mins. More than 50% of that time was spent in counseling and/or coordination of care.      Shelly Coss, MD [facility] Pager [phone]  If 7PM-7AM, please contact night-coverage [email or web address] Password [email or web address] 12/24/2018, 12:08 PM

## 2018-12-24 NOTE — Evaluation (Signed)
Physical Therapy Evaluation Patient Details Name: [name] MRN: [id number] DOB: [birth year] Today's Date: 12/24/2018   History of Present Illness  Pt admitted through ED with c/p weaknes and dx with sepsis 2* acute Diverticulitis and supratherapeutic INR.  Pt with hx of CVA, DM, CHF, AICD placement, CABG and ischemic cardiomyopathy  Clinical Impression  Pt admitted as above and presenting with functional mobility limitations 2* generalized weakness, poor endurance and balance deficits. Pt hopes to progress to dc home with assist of spouse who is present for part of session and appears strong and capable.    Follow Up Recommendations Home health PT    Equipment Recommendations  None recommended by PT    Recommendations for Other Services       Precautions / Restrictions Precautions Precautions: Fall Restrictions Weight Bearing Restrictions: No      Mobility  Bed Mobility Overal bed mobility: Needs Assistance Bed Mobility: Supine to Sit     Supine to sit: Mod assist;+2 for safety/equipment        Transfers Overall transfer level: Needs assistance Equipment used: Rolling walker (2 wheeled) Transfers: Sit to/from [facility] Sit to Stand: Mod assist;+2 safety/equipment Stand pivot transfers: Mod assist;+2 safety/equipment          Ambulation/Gait Ambulation/Gait assistance: Min assist;Mod assist;+2 safety/equipment Gait Distance (Feet): 11 Feet Assistive device: Rolling walker (2 wheeled) Gait Pattern/deviations: Step-to pattern;Decreased step length - right;Decreased step length - left;Shuffle;Trunk flexed Gait velocity: decr   General Gait Details: cues for posture and position from RW.  Distance ltd by fatigue  Stairs            Wheelchair Mobility    Modified Rankin (Stroke Patients Only)       Balance Overall balance assessment: Needs assistance Sitting-balance support: No upper extremity supported Sitting balance-Leahy  Scale: Good     Standing balance support: Bilateral upper extremity supported Standing balance-Leahy Scale: Poor                               Pertinent Vitals/Pain Pain Assessment: No/denies pain    Home Living Family/patient expects to be discharged to:: Private residence Living Arrangements: Spouse/significant other Available Help at Discharge: Family;Available 24 hours/day Type of Home: House Home Access: Stairs to enter Entrance Stairs-Rails: None Entrance Stairs-Number of Steps: 2 Home Layout: One level Home Equipment: Walker - 2 wheels;Toilet riser;Cane - single point      Prior Function Level of Independence: Independent with assistive device(s)               Hand Dominance   Dominant Hand: Right    Extremity/Trunk Assessment   Upper Extremity Assessment Upper Extremity Assessment: Generalized weakness    Lower Extremity Assessment Lower Extremity Assessment: Generalized weakness    Cervical / Trunk Assessment Cervical / Trunk Assessment: Kyphotic  Communication   Communication: No difficulties  Cognition Arousal/Alertness: Awake/alert Behavior During Therapy: WFL for tasks assessed/performed Overall Cognitive Status: History of cognitive impairments - at baseline                                        General Comments      Exercises     Assessment/Plan    PT Assessment Patient needs continued PT services  PT Problem List Decreased strength;Decreased activity tolerance;Decreased balance;Decreased mobility;Decreased knowledge of use of DME  PT Treatment Interventions DME instruction;Gait training;Stair training;Functional mobility training;Therapeutic activities;Therapeutic exercise;Patient/family education    PT Goals (Current goals can be found in the Care Plan section)  Acute Rehab PT Goals Patient Stated Goal: home with husband PT Goal Formulation: With patient Time For Goal Achievement:  01/07/19 Potential to Achieve Goals: Fair    Frequency Min 3X/week   Barriers to discharge        Co-evaluation PT/OT/SLP Co-Evaluation/Treatment: Yes Reason for Co-Treatment: For patient/therapist safety;To address functional/ADL transfers PT goals addressed during session: Mobility/safety with mobility OT goals addressed during session: ADL's and self-care       AM-PAC PT "6 Clicks" Mobility  Outcome Measure Help needed turning from your back to your side while in a flat bed without using bedrails?: A Lot Help needed moving from lying on your back to sitting on the side of a flat bed without using bedrails?: A Lot Help needed moving to and from a bed to a chair (including a wheelchair)?: A Lot Help needed standing up from a chair using your arms (e.g., wheelchair or bedside chair)?: A Lot Help needed to walk in hospital room?: A Lot Help needed climbing 3-5 steps with a railing? : A Lot 6 Click Score: 12    End of Session Equipment Utilized During Treatment: Gait belt Activity Tolerance: Patient tolerated treatment well;Patient limited by fatigue Patient left: in chair;with call bell/phone within reach;with chair alarm set Nurse Communication: Mobility status PT Visit Diagnosis: Muscle weakness (generalized) (M62.81);Difficulty in walking, not elsewhere classified (R26.2)    Time: 1201-1227 PT Time Calculation (min) (ACUTE ONLY): 26 min   Charges:   PT Evaluation $PT Eval Low Complexity: 1 Low          Debe Coder PT Acute Rehabilitation Services Pager [phone] Office [phone]   Riven Mabile 12/24/2018, 5:14 PM

## 2018-12-24 NOTE — Evaluation (Signed)
Occupational Therapy Evaluation Patient Details Name: [name] MRN: [id number] DOB: December 19, 1940 Today's Date: 12/24/2018    History of Present Illness Patient is a 78 year old female with history of ischemic cardiomyopathy with ejection fraction of 35%, status post AICD, A. fib status post maze procedure in thousand 8, diabetes mellitus, hypertension, diverticulosis, arthritis who presented with abdomen pain, diarrhea.  She was diagnosed with diverticulitis as an outpatient and was on 2 rounds of antibiotics was did not help.  She presented to the emergency department  because she continued to have severe diarrhea,  poor oral intake, generalized weakness.  Abdominal CT done in the emergency department showed diverticulitis of a giant diverticulum.   Clinical Impression   Pt admitted with weakness. Pt currently with functional limitations due to the deficits listed below (see OT Problem List).  Pt will benefit from skilled OT to increase their safety and independence with ADL and functional mobility for ADL to facilitate discharge to venue listed below.   Husband shared he had been A ing pt with ADL activity at home as needed.  Pt weaker right now than normal but he will be able to A her as needed     Follow Up Recommendations  Home health OT;Supervision/Assistance - 24 hour    Equipment Recommendations  None recommended by OT    Recommendations for Other Services       Precautions / Restrictions Precautions Precautions: Fall      Mobility Bed Mobility Overal bed mobility: Needs Assistance Bed Mobility: Supine to Sit     Supine to sit: Mod assist;+2 for safety/equipment        Transfers Overall transfer level: Needs assistance Equipment used: Rolling walker (2 wheeled) Transfers: Sit to/from [facility] Sit to Stand: Mod assist;+2 safety/equipment Stand pivot transfers: Mod assist;+2 safety/equipment            Balance Overall balance assessment:  Needs assistance Sitting-balance support: No upper extremity supported Sitting balance-Leahy Scale: Good     Standing balance support: Bilateral upper extremity supported Standing balance-Leahy Scale: Poor                             ADL either performed or assessed with clinical judgement   ADL Overall ADL's : Needs assistance/impaired Eating/Feeding: Set up;Sitting   Grooming: Set up;Sitting   Upper Body Bathing: Minimal assistance;Sitting   Lower Body Bathing: Moderate assistance;Sit to/from stand;Cueing for compensatory techniques;Cueing for safety   Upper Body Dressing : Minimal assistance;Sitting   Lower Body Dressing: Sit to/from stand;Cueing for compensatory techniques;Cueing for safety;Maximal assistance   Toilet Transfer: Moderate assistance;+2 for safety/equipment;Stand-pivot;Cueing for safety;Cueing for sequencing;RW;BSC   Toileting- Clothing Manipulation and Hygiene: Maximal assistance;+2 for safety/equipment;Cueing for safety;Cueing for compensatory techniques;Sit to/from stand         General ADL Comments: Husband will A pt at home with ADL activity     Vision Patient Visual Report: No change from baseline              Pertinent Vitals/Pain Pain Assessment: No/denies pain     Hand Dominance     Extremity/Trunk Assessment Upper Extremity Assessment Upper Extremity Assessment: Generalized weakness           Communication Communication Communication: No difficulties   Cognition Arousal/Alertness: Awake/alert Behavior During Therapy: WFL for tasks assessed/performed Overall Cognitive Status: History of cognitive impairments - at baseline  Home Living Family/patient expects to be discharged to:: Private residence Living Arrangements: Spouse/significant other Available Help at Discharge: Family;Available 24 hours/day Type of Home: House Home Access: Stairs to  enter [employer] of Steps: 2   Home Layout: One level     Bathroom Shower/Tub: Occupational psychologist: Standard     Home Equipment: Environmental consultant - 2 wheels;Toilet riser;Cane - single point          Prior Functioning/Environment Level of Independence: Independent with assistive device(s)                 OT Problem List: Decreased strength;Decreased activity tolerance;Impaired balance (sitting and/or standing)      OT Treatment/Interventions: Self-care/ADL training;Patient/family education;DME and/or AE instruction    OT Goals(Current goals can be found in the care plan section) Acute Rehab OT Goals Patient Stated Goal: home with husband OT Goal Formulation: With patient Time For Goal Achievement: Jan 25, 2019 Potential to Achieve Goals: Good ADL Goals Pt Will Perform Grooming: with supervision;standing Pt Will Perform Lower Body Dressing: with min guard assist;sit to/from stand Pt Will Transfer to Toilet: with supervision;regular height toilet Pt Will Perform Toileting - Clothing Manipulation and hygiene: with supervision;sit to/from stand Pt Will Perform Tub/Shower Transfer: Shower transfer;with min guard assist  OT Frequency: Min 2X/week           Co-evaluation PT/OT/SLP Co-Evaluation/Treatment: Yes Reason for Co-Treatment: To address functional/ADL transfers   OT goals addressed during session: ADL's and self-care      AM-PAC OT "6 Clicks" Daily Activity     Outcome Measure Help from another person eating meals?: None Help from another person taking care of personal grooming?: A Little Help from another person toileting, which includes using toliet, bedpan, or urinal?: A Lot Help from another person bathing (including washing, rinsing, drying)?: A Lot Help from another person to put on and taking off regular upper body clothing?: A Little Help from another person to put on and taking off regular lower body clothing?: A Lot 6 Click Score:  16   End of Session Equipment Utilized During Treatment: Rolling walker Nurse Communication: Mobility status  Activity Tolerance: Patient tolerated treatment well Patient left: in chair;with call bell/phone within reach;with family/visitor present  OT Visit Diagnosis: Unsteadiness on feet (R26.81);Other abnormalities of gait and mobility (R26.89);Muscle weakness (generalized) (M62.81)                Time: 6226-3335 OT Time Calculation (min): 27 min Charges:  OT General Charges $OT Visit: 1 Visit OT Evaluation $OT Eval Moderate Complexity: 1 Mod  Kari Baars, [facility] [phone] Office- [phone], Edwena Felty D 12/24/2018, 3:39 PM

## 2018-12-25 LAB — CBC WITH DIFFERENTIAL/PLATELET
Abs Immature Granulocytes: 0.08 10*3/uL — ABNORMAL HIGH (ref 0.00–0.07)
Basophils Absolute: 0 10*3/uL (ref 0.0–0.1)
Basophils Relative: 0 %
Eosinophils Absolute: 0 10*3/uL (ref 0.0–0.5)
Eosinophils Relative: 0 %
HCT: 36.6 % (ref 36.0–46.0)
Hemoglobin: 10.9 g/dL — ABNORMAL LOW (ref 12.0–15.0)
Immature Granulocytes: 1 %
Lymphocytes Relative: 4 %
Lymphs Abs: 0.4 10*3/uL — ABNORMAL LOW (ref 0.7–4.0)
MCH: 31.7 pg (ref 26.0–34.0)
MCHC: 29.8 g/dL — ABNORMAL LOW (ref 30.0–36.0)
MCV: 106.4 fL — ABNORMAL HIGH (ref 80.0–100.0)
Monocytes Absolute: 0.7 10*3/uL (ref 0.1–1.0)
Monocytes Relative: 7 %
Neutro Abs: 9.8 10*3/uL — ABNORMAL HIGH (ref 1.7–7.7)
Neutrophils Relative %: 88 %
Platelets: 247 10*3/uL (ref 150–400)
RBC: 3.44 MIL/uL — ABNORMAL LOW (ref 3.87–5.11)
RDW: 14.9 % (ref 11.5–15.5)
WBC: 11 10*3/uL — ABNORMAL HIGH (ref 4.0–10.5)
nRBC: 0 % (ref 0.0–0.2)

## 2018-12-25 LAB — BASIC METABOLIC PANEL
Anion gap: 8 (ref 5–15)
BUN: 21 mg/dL (ref 8–23)
CO2: 20 mmol/L — ABNORMAL LOW (ref 22–32)
Calcium: 7.2 mg/dL — ABNORMAL LOW (ref 8.9–10.3)
Chloride: 117 mmol/L — ABNORMAL HIGH (ref 98–111)
Creatinine, Ser: 1.08 mg/dL — ABNORMAL HIGH (ref 0.44–1.00)
GFR calc Af Amer: 57 mL/min — ABNORMAL LOW (ref >60)
GFR calc non Af Amer: 49 mL/min — ABNORMAL LOW (ref >60)
Glucose, Bld: 118 mg/dL — ABNORMAL HIGH (ref 70–99)
Potassium: 3.9 mmol/L (ref 3.5–5.1)
Sodium: 145 mmol/L (ref 135–145)

## 2018-12-25 LAB — GLUCOSE, CAPILLARY
Glucose-Capillary: 91 mg/dL (ref 70–99)
Glucose-Capillary: 142 mg/dL — ABNORMAL HIGH (ref 70–99)
Glucose-Capillary: 146 mg/dL — ABNORMAL HIGH (ref 70–99)
Glucose-Capillary: 106 mg/dL — ABNORMAL HIGH (ref 70–99)

## 2018-12-25 LAB — PROTIME-INR
INR: 4.1 (ref 0.8–1.2)
Prothrombin Time: 38.8 seconds — ABNORMAL HIGH (ref 11.4–15.2)

## 2018-12-25 MED ORDER — POLYETHYLENE GLYCOL 3350 17 G PO PACK
17.0000 g | PACK | Freq: Every day | ORAL | Status: DC
  Administered 2018-12-25 – 2018-12-27 (×3): 17 g via ORAL
  Filled 2018-12-25 (×3): qty 1

## 2018-12-25 MED ORDER — ENSURE ENLIVE PO LIQD
237.0000 mL | Freq: Two times a day (BID) | ORAL | Status: DC
  Administered 2018-12-26 – 2018-12-27 (×3): 237 mL via ORAL

## 2018-12-25 MED ORDER — ADULT MULTIVITAMIN W/MINERALS CH
1.0000 | ORAL_TABLET | Freq: Every day | ORAL | Status: DC
  Administered 2018-12-25 – 2018-12-27 (×3): 1 via ORAL
  Filled 2018-12-25 (×3): qty 1

## 2018-12-25 MED ORDER — BOOST / RESOURCE BREEZE PO LIQD CUSTOM
1.0000 | Freq: Two times a day (BID) | ORAL | Status: DC
  Administered 2018-12-25 – 2018-12-26 (×3): 1 via ORAL

## 2018-12-25 NOTE — TOC Initial Note (Signed)
Transition of Care [facility]) - Initial/Assessment Note    Patient Details  Name: [name] MRN: [id number] Date of Birth: 1941/03/05  Transition of Care (TOC) CM/SW Contact:    Joaquin Courts, RN Phone Number: 12/25/2018, 1:19 PM  Clinical Narrative:       CM spoke with patient who referred CM to spouse. Spouse shares that patient is very deconditioned and they have no children or additional family that can help them at home, they would like for patient to go to SNF for short term rehab prior to returning home. FL2 faxed out to area facilities, awaiting bed offers.              Expected Discharge Plan: Skilled Nursing Facility Barriers to Discharge: Continued Medical Work up   Patient Goals and CMS Choice Patient states their goals for this hospitalization and ongoing recovery are:: to go to rehab and get stronger CMS Medicare.gov Compare Post Acute Care list provided to:: Patient Represenative (must comment) Choice offered to / list presented to : Spouse  Expected Discharge Plan and Services Expected Discharge Plan: [place]   Discharge Planning Services: CM Consult   Living arrangements for the past 2 months: Single Family Home                 DME Arranged: N/A DME Agency: NA       HH Arranged: NA HH Agency: NA        Prior Living Arrangements/Services Living arrangements for the past 2 months: [place] Lives with:: Spouse Patient language and need for interpreter reviewed:: Yes Do you feel safe going back to the place where you live?: Yes      Need for Family Participation in Patient Care: Yes (Comment) Care giver support system in place?: Yes (comment)   Criminal Activity/Legal Involvement Pertinent to Current Situation/Hospitalization: No - Comment as needed  Activities of Daily Living Home Assistive Devices/Equipment: Cane (specify quad or straight), Walker (specify type), Dentures (specify type)(single point cane, rollator,  lower partial plate) ADL Screening (condition at time of admission) Patient's cognitive ability adequate to safely complete daily activities?: Yes Is the patient deaf or have difficulty hearing?: No Does the patient have difficulty seeing, even when wearing glasses/contacts?: No Does the patient have difficulty concentrating, remembering, or making decisions?: No Patient able to express need for assistance with ADLs?: Yes Does the patient have difficulty dressing or bathing?: Yes Independently performs ADLs?: No Communication: Independent Dressing (OT): Needs assistance Is this a change from baseline?: Change from baseline, expected to last >3 days Grooming: Needs assistance Is this a change from baseline?: Change from baseline, expected to last >3 days Feeding: Needs assistance Is this a change from baseline?: Change from baseline, expected to last >3 days Bathing: Needs assistance Is this a change from baseline?: Change from baseline, expected to last >3 days Toileting: Needs assistance Is this a change from baseline?: Change from baseline, expected to last >3days In/Out Bed: Needs assistance Is this a change from baseline?: Change from baseline, expected to last >3 days Walks in Home: Needs assistance Is this a change from baseline?: Change from baseline, expected to last >3 days Does the patient have difficulty walking or climbing stairs?: Yes(secondary to weakness) Weakness of Legs: Both Weakness of Arms/Hands: Both  Permission Sought/Granted   Permission granted to share information with : Yes, Verbal Permission Granted  Share Information with NAME: Fritz Pickerel     Permission granted to share info w Relationship: spouse  Emotional Assessment Appearance:: Appears stated age Attitude/Demeanor/Rapport: Engaged Affect (typically observed): Accepting Orientation: : Oriented to Place, Oriented to  Time, Oriented to Situation, Oriented to Self   Psych Involvement: No  (comment)  Admission diagnosis:  Renal calculus [N20.0] Acute diverticulitis [K57.92] Patient Active Problem List   Diagnosis Date Noted  . Diverticulitis 12/21/2018  . Sepsis ([place]) 12/21/2018  . Pain due to onychomycosis of toenails of both feet 10/03/2018  . Coagulation disorder ([place]) 10/03/2018  . Diabetes mellitus without complication ([place]) 42/68/3419  . ICD (implantable cardioverter-defibrillator) in place 11/28/2012  . Kidney disease, chronic, stage III (GFR 30-59 ml/min) 10/19/2012  . 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 Back pain, thoracic 02/21/2012  . Ischemic cardiomyopathy 01/26/2012  . Carcinoma in situ of vulva   . Fibroid   . Ovarian cyst, left   . Osteoporosis, idiopathic   . 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 GERD (gastroesophageal reflux disease)   . Encounter for long-term (current) use of anticoagulants 06/08/2010  . Mitral valve disease 04/16/2010  . VITAMIN D DEFICIENCY 11/05/2008  . VITAMIN B12 DEFICIENCY 10/22/2008  . Essential hypertension, benign 07/30/2008  . Celiac disease 08/09/2007  . HYPERLIPIDEMIA 07/21/2007  . Atrial fibrillation ([place]) 07/21/2007  . CHOLELITHIASIS 07/21/2007  . UNSPECIFIED IRON DEFICIENCY ANEMIA 06/06/2007   PCP:  Velna Hatchet, MD Pharmacy:   [id number] [address] [place], [place] [address] Phone: [phone] Fax: [phone]  [place], [place] [address] Phone: [phone] Fax: [place] [address] [place] [address] [place] [address] Phone: [phone] Fax: [phone]  [facility] [address] [place] [address] Phone: [phone]  Fax: [phone]  [facility] [place], [address] [place] [address] Phone: [phone] Fax: [phone]  [id number] [place], [place] [address] Phone: [phone] Fax: [phone]     Social Determinants of Health (SDOH) Interventions    Readmission Risk Interventions No flowsheet data found.

## 2018-12-25 NOTE — Plan of Care (Signed)
  Problem: Education: Goal: Knowledge of General Education information will improve Description: Including pain rating scale, medication(s)/side effects and non-pharmacologic comfort measures Outcome: Completed/Met   Problem: Clinical Measurements: Goal: Will remain free from infection Outcome: Progressing Goal: Diagnostic test results will improve Outcome: Progressing Goal: Respiratory complications will improve Outcome: Completed/Met Goal: Cardiovascular complication will be avoided Outcome: Progressing   Problem: Activity: Goal: Risk for activity intolerance will decrease Outcome: Progressing   Problem: Nutrition: Goal: Adequate nutrition will be maintained Outcome: Progressing   Problem: Coping: Goal: Level of anxiety will decrease Outcome: Progressing   Problem: Elimination: Goal: Will not experience complications related to bowel motility Outcome: Completed/Met Goal: Will not experience complications related to urinary retention Outcome: Progressing   Problem: Pain Managment: Goal: General experience of comfort will improve Outcome: Completed/Met   Problem: Safety: Goal: Ability to remain free from injury will improve Outcome: Progressing   Problem: Skin Integrity: Goal: Risk for impaired skin integrity will decrease Outcome: Progressing

## 2018-12-25 NOTE — Care Management Important Message (Signed)
Important Message  Patient Details IM Letter given to Nancy Marus RN to present to the Patient Name: [name] MRN: [id number] Date of Birth: 11-01-1940   Medicare Important Message Given:  Yes     Kerin Salen 12/25/2018, 11:52 AM

## 2018-12-25 NOTE — Progress Notes (Addendum)
Initial Nutrition Assessment  RD working remotely.   DOCUMENTATION CODES:   (unable to assess for malnutrition at this time.)  INTERVENTION:  - will order Boost Breeze BID, each supplement provides 250 kcal and 9 grams of protein. - will order Ensure Enlive BID, each supplement provides 350 kcal and 20 grams of protein. - will order daily multivitamin with minerals. - continue to encourage PO intakes.    NUTRITION DIAGNOSIS:   Inadequate oral intake related to poor appetite, acute illness as evidenced by meal completion < 50%, per patient/family report.  GOAL:   Patient will meet greater than or equal to 90% of their needs  MONITOR:   PO intake, Supplement acceptance, Labs, Weight trends, Skin  REASON FOR ASSESSMENT:   Consult Assessment of nutrition requirement/status  ASSESSMENT:   78 year old female with medical history of ischemic cardiomyopathy, s/p AICD, A. fib s/p maze procedure in 2008, DM, HTN, diverticulosis, arthritis, s/p mitral valve repair, and aortic valve replacement. She presented to the ED on 10/15 with abdominal pain and diarrhea. She was diagnosed with diverticulitis as an outpatient and was on 2 rounds of antibiotics which did not help.  She presented to the ED because of severe diarrhea, poor oral intake, and generalized weakness. CT abdomen in the ED showed diverticulitis of a giant diverticulum.  CLD ordered at the time of admission, advanced to [place] on 10/17 at 1138, and then advanced to soft today at 0835. Per flow sheet, patient consumed 5% of lunch and dinner on 10/17; 0% of breakfast and 20% of dinner on 10/18; 0% of breakfast this AM. Patient reports ongoing poor intakes d/t abdominal discomfort and fear of increasing diarrhea. She mainly nibbles or sips on liquids. She is nervous to try solid foods. Prior to the beginning of symptoms, appetite was at baseline and she was able to eat without any discomfort or GI issues.  Per chart review, current  weight is 135 lb and weight on 4/8 was 148 lb. This indicates 13 lb weight loss (8.8% body weight) in the past 6 months; not significant for time frame.  Per notes: - acute diverticulitis--improving, Surgery not consuled  - mild hydronephrosis of the L kidney, R renal cyst - debility/deconditioning  - MD note today states likely d/c home tomorrow (10/20) after INR is back down   Labs reviewed; CBGs: 91 and 142 mg/dl, Cl: 117 mmol/l, creatinine: 1.08 mg/dl, Ca: 7.2 mg/dl, GFR: 49 ml/min.  Medications reviewed; sliding scale novllog, 1 packet miralax/day, 2500 mcg oral cyanocobalamin/day.    NUTRITION - FOCUSED PHYSICAL EXAM:  unable to complete at this time.  Diet Order:   Diet Order            DIET SOFT Room service appropriate? Yes; Fluid consistency: Thin  Diet effective now              EDUCATION NEEDS:   No education needs have been identified at this time  Skin:  Skin Assessment: Reviewed RN Assessment  Last BM:  10/19  Height:   Ht Readings from Last 1 Encounters:  12/23/18 5\' 6"  (1.676 m)    Weight:   Wt Readings from Last 1 Encounters:  12/24/18 61.4 kg    Ideal Body Weight:  59.1 kg  BMI:  Body mass index is 21.85 kg/m.  Estimated Nutritional Needs:   Kcal:  1600-1800 kcal  Protein:  70-80 grams  Fluid:  >/= 2 L/day      Jarome Matin, MS, RD, LDN, CNSC Inpatient Clinical  Dietitian Pager # [phone] After hours/weekend pager # [phone]

## 2018-12-25 NOTE — Progress Notes (Signed)
PROGRESS NOTE    ADIANNA Hamilton  OZY:[id number] DOB: 06-Aug-1940 DOA: 12/21/2018 PCP: Velna Hatchet, MD   Brief Narrative:  Patient is a 78 year old female with history of ischemic cardiomyopathy with ejection fraction of 35%, status post AICD, A. fib status post maze procedure in 2008, diabetes mellitus, hypertension, diverticulosis, arthritis, status post mitral valve repair, aortic valve replacement who presented with abdomen pain, diarrhea.  She was diagnosed with diverticulitis as an outpatient and was on 2 rounds of antibiotics was did not help.  She presented to the emergency department  because she continued to have severe diarrhea,  poor oral intake, generalized weakness.  Abdominal CT done in the emergency department showed diverticulitis of a giant diverticulum.  She was admitted for further management. Started on IV antibiotic. Currently she is clinically improving  Assessment & Plan:   Principal Problem:   Sepsis ([place]) Active Problems:   Essential hypertension, benign   Atrial fibrillation (HCC)   GERD (gastroesophageal reflux disease)   Kidney disease, chronic, stage III (GFR 30-59 ml/min)   Diverticulitis   Acute diverticulitis: Clinically improving, afebrile, hemodynamically stable.  Leukocytosis resolved.  CT imaging showed 7.8 x 7.3 cm rounded air-filled structure adjacent to the sigmoid colon, concerning for acute diverticulitis of a giant colonic diverticulum.  No perforation or abscess noted.  Since she is clinically improving, general surgery not consulted.  Continue IV antibiotics for now.  Currently she is on full liquid diet which we will advance to soft diet. No  Abdominal pain, nausea and vomiting   Continue pain management, antiemetics.  She had a bowel movement today.  History of cholelithiasis/left-sided hydroureteronephrosis: ultrasound showed mild hydronephrosis of the left kidney, 15 mm left kidney stone, 5.5 centimeter right renal cyst.  She follows with Dr.  Lovena Neighbours.  We recommend follow-up with urology as an outpatient.  CKD stage III: Currently kidney function is at baseline.  Supratherapeutic INR:  Takes warfarin for anticoagulation for A. fib.  Currently INR is subtherapeutic.  Continue to monitor INR.  Paroxysmal A. fib: Currently normal sinus rhythm.  Rate is controlled.  Continue Coreg, amiodarone.  On warfarin for anticoagulation at home.  Chronic systolic heart failure: Status post AICD.  Currently following.  Continue gentle IV fluids for diverticulitis.  On Coreg.  Diuretics on hold  Status post mitral/aortic valve replacement: Follow-up with cardiology as outpatient.  Not sure whether the valves were mechanical in nature  Hypertension: Currently blood pressure stable.  Continue to hold home medicines for blood pressure.  Diabetes type 2: Continue sliding scale insulin.  Debility/deconditioning: She has been seen by physical therapy and recommended HHPT          DVT prophylaxis:Supratherapeutic INR Code Status: Full Family Communication: Husband  at the bedside on 12/24/18 Disposition Plan: Home with home health tomorrow after INR comes down  Consultants: None  Procedures: None  Antimicrobials:  Anti-infectives (From admission, onward)   Start     Dose/Rate Route Frequency Ordered Stop   12/21/18 2200  piperacillin-tazobactam (ZOSYN) IVPB 3.375 g     3.375 g 12.5 mL/hr over 240 Minutes Intravenous Every 8 hours 12/21/18 1642     12/21/18 2000  piperacillin-tazobactam (ZOSYN) IVPB 3.375 g  Status:  Discontinued     3.375 g 100 mL/hr over 30 Minutes Intravenous Every 6 hours 12/21/18 1953 12/21/18 1955   12/21/18 1445  piperacillin-tazobactam (ZOSYN) IVPB 3.375 g     3.375 g 12.5 mL/hr over 240 Minutes Intravenous  Once 12/21/18 1431 12/21/18 1937  Subjective:  Patient seen and examined the bedside this morning.  Hemodynamically stable.  Looks comfortable and denies any abdominal pain, nausea or vomiting.   Had a bowel movement.  Tolerating full liquid diet.  Could not discharge today because INR is creeping up.  Advance diet to soft today.  Objective: Vitals:   12/24/18 0433 12/24/18 1703 12/24/18 2013 12/25/18 0505  BP: 115/67 129/76 91/68 124/76  Pulse: 67 70 81 69  Resp: 18  18 15   Temp: 97.9 F (36.6 C) 97.8 F (36.6 C) 97.8 F (36.6 C) 97.7 F (36.5 C)  TempSrc: Oral Oral Oral Oral  SpO2: 95% 95% 99% 95%  Weight:   61.4 kg   Height:        Intake/Output Summary (Last 24 hours) at 12/25/2018 1137 Last data filed at 12/25/2018 1000 Gross per 24 hour  Intake 2638.43 ml  Output -  Net 2638.43 ml   Filed Weights   12/23/18 0833 12/24/18 2013  Weight: 53.6 kg 61.4 kg    Examination:  General exam: Appears calm and comfortable ,Not in distress, pleasant elderly female  HEENT:PERRL,Oral mucosa moist, Ear/Nose normal on gross exam Respiratory system: Bilateral equal air entry, normal vesicular breath sounds, no wheezes or crackles  Cardiovascular system: S1 & S2 heard, RRR. No JVD, murmurs, rubs, gallops or clicks. No pedal edema. Gastrointestinal system: Abdomen is mifdlydistended, soft and nontender.No organomegaly or masses felt. Normal bowel sounds heard. Central nervous system: Alert and oriented. No focal neurological deficits. Extremities: No edema, no clubbing ,no cyanosis, distal peripheral pulses palpable. Skin: No rashes, lesions or ulcers,no icterus ,no pallor   Data Reviewed: I have personally reviewed following labs and imaging studies  CBC: Recent Labs  Lab 12/21/18 1102 12/22/18 0348 12/23/18 0350 12/24/18 0402 12/25/18 0320  WBC 12.0* 11.3* 8.5 8.2 11.0*  NEUTROABS 11.0*  --  7.4 6.9 9.8*  HGB 11.8* 11.5* 10.5* 10.8* 10.9*  HCT 37.9 36.2 34.2* 35.3* 36.6  MCV 102.4* 102.0* 102.4* 104.1* 106.4*  PLT 323 324 276 260 983   Basic Metabolic Panel: Recent Labs  Lab 12/21/18 1102 12/22/18 0348 12/23/18 0350 12/24/18 0402 12/25/18 0320  NA 141  141 142 143 145  K 4.6 4.6 3.9 3.9 3.9  CL 104 105 106 112* 117*  CO2 27 23 24  21* 20*  GLUCOSE 181* 116* 99 96 118*  BUN 30* 26* 23 22 21   CREATININE 1.52* 1.25* 1.28* 1.24* 1.08*  CALCIUM 9.1 8.4* 8.1* 7.6* 7.2*   GFR: Estimated Creatinine Clearance: 40.8 mL/min (A) (by C-G formula based on SCr of 1.08 mg/dL (H)). Liver Function Tests: Recent Labs  Lab 12/21/18 1102  AST 15  ALT 7  ALKPHOS 29*  BILITOT 1.1  PROT 6.0*  ALBUMIN 3.1*   No results for input(s): LIPASE, AMYLASE in the last 168 hours. No results for input(s): AMMONIA in the last 168 hours. Coagulation Profile: Recent Labs  Lab 12/21/18 1102 12/22/18 0348 12/23/18 0350 12/24/18 0402 12/25/18 0320  INR 2.4* 2.6* 3.6* 3.6* 4.1*   Cardiac Enzymes: No results for input(s): CKTOTAL, CKMB, CKMBINDEX, TROPONINI in the last 168 hours. BNP (last 3 results) No results for input(s): PROBNP in the last 8760 hours. HbA1C: No results for input(s): HGBA1C in the last 72 hours. CBG: Recent Labs  Lab 12/24/18 1139 12/24/18 1646 12/24/18 2144 12/25/18 0734 12/25/18 1130  GLUCAP 147* 108* 167* 91 142*   Lipid Profile: No results for input(s): CHOL, HDL, LDLCALC, TRIG, CHOLHDL, LDLDIRECT in the last 72  hours. Thyroid Function Tests: No results for input(s): TSH, T4TOTAL, FREET4, T3FREE, THYROIDAB in the last 72 hours. Anemia Panel: No results for input(s): VITAMINB12, FOLATE, FERRITIN, TIBC, IRON, RETICCTPCT in the last 72 hours. Sepsis Labs: No results for input(s): PROCALCITON, LATICACIDVEN in the last 168 hours.  Recent Results (from the past 240 hour(s))  SARS Coronavirus 2 by RT PCR (hospital order, performed in [facility] hospital lab) Nasopharyngeal Nasopharyngeal Swab     Status: None   Collection Time: 12/21/18  2:56 PM   Specimen: Nasopharyngeal Swab  Result Value Ref Range Status   SARS Coronavirus 2 NEGATIVE NEGATIVE Final    Comment: (NOTE) If result is NEGATIVE SARS-CoV-2 target nucleic acids  are NOT DETECTED. The SARS-CoV-2 RNA is generally detectable in upper and lower  respiratory specimens during the acute phase of infection. The lowest  concentration of SARS-CoV-2 viral copies this assay can detect is 250  copies / mL. A negative result does not preclude SARS-CoV-2 infection  and should not be used as the sole basis for treatment or other  patient management decisions.  A negative result may occur with  improper specimen collection / handling, submission of specimen other  than nasopharyngeal swab, presence of viral mutation(s) within the  areas targeted by this assay, and inadequate number of viral copies  (<250 copies / mL). A negative result must be combined with clinical  observations, patient history, and epidemiological information. If result is POSITIVE SARS-CoV-2 target nucleic acids are DETECTED. The SARS-CoV-2 RNA is generally detectable in upper and lower  respiratory specimens dur ing the acute phase of infection.  Positive  results are indicative of active infection with SARS-CoV-2.  Clinical  correlation with patient history and other diagnostic information is  necessary to determine patient infection status.  Positive results do  not rule out bacterial infection or co-infection with other viruses. If result is PRESUMPTIVE POSTIVE SARS-CoV-2 nucleic acids MAY BE PRESENT.   A presumptive positive result was obtained on the submitted specimen  and confirmed on repeat testing.  While 2019 novel coronavirus  (SARS-CoV-2) nucleic acids may be present in the submitted sample  additional confirmatory testing may be necessary for epidemiological  and / or clinical management purposes  to differentiate between  SARS-CoV-2 and other Sarbecovirus currently known to infect humans.  If clinically indicated additional testing with an alternate test  methodology [phone]) is advised. The SARS-CoV-2 RNA is generally  detectable in upper and lower respiratory sp ecimens  during the acute  phase of infection. The expected result is Negative. Fact Sheet for Patients:  StrictlyIdeas.no Fact Sheet for Healthcare Providers: BankingDealers.co.za This test is not yet approved or cleared by the [place] FDA and has been authorized for detection and/or diagnosis of SARS-CoV-2 by FDA under an Emergency Use Authorization (EUA).  This EUA will remain in effect (meaning this test can be used) for the duration of the COVID-19 declaration under Section 564(b)(1) of the Act, 21 U.S.C. section 360bbb-3(b)(1), unless the authorization is terminated or revoked sooner. Performed at [facility], [place] [address], [place], [place] [address]   Culture, Urine     Status: None   Collection Time: 12/22/18  4:49 PM   Specimen: Urine, Catheterized  Result Value Ref Range Status   Specimen Description   Final    URINE, CATHETERIZED Performed at [place] [address] [place], [place] [address]    Special Requests   Final    NONE Performed at  [facility], [place] [address] [place], [place] [address]    Culture   Final    NO GROWTH Performed at [facility], [place] [address], [place], [place] [address]    Report Status 12/24/2018 FINAL  Final         Radiology Studies: US Renal  Result Date: 12/23/2018 CLINICAL DATA:  Evaluate left-sided hydronephrosis. EXAM: RENAL / URINARY TRACT ULTRASOUND COMPLETE COMPARISON:  CT scan December 21, 2018 FINDINGS: Right Kidney: Renal measurements: 9.7 x 3.9 x 4.1 cm = volume: 81.9 mL. A 5.5 cm cyst was measured on the right. No solid masses identified. A 7 mm stone is noted. No hydronephrosis. Left Kidney: Renal measurements: 9.9 x 3.6 x 3.6 cm = volume: 65.5 mL. Mild hydronephrosis. Left-sided stones are seen. The largest stone measures 15 mm. Bladder: Appears normal for degree of bladder distention. Other: Calcified  fibroids are seen in the uterus. IMPRESSION: 1. Mild hydronephrosis remains on the left. Stones are seen in the left kidney. The largest stone measures 15 mm. 2. No hydronephrosis on the right. There is a 7 mm stone on the right. 3. A 5.5 cm cyst was measured on the right. The etiology of this finding is unclear as there is no right renal cyst on the CT scan from December 21, 2018. 4. Fibroid uterus. Electronically Signed   By: Dorise Bullion III M.D   On: 12/23/2018 17:30        Scheduled Meds: . amiodarone  100 mg Oral Daily  . carvedilol  12.5 mg Oral BID WC  . insulin aspart  0-9 Units Subcutaneous TID WC  . polyethylene glycol  17 g Oral Daily  . vitamin B-12  2,500 mcg Oral Q M,W,F  . Warfarin - Pharmacist Dosing Inpatient   Does not apply q1800   Continuous Infusions: . piperacillin-tazobactam (ZOSYN)  IV 12.5 mL/hr at 12/25/18 0600     LOS: 4 days    Time spent:35 mins. More than 50% of that time was spent in counseling and/or coordination of care.      Shelly Coss, MD [facility] Pager [phone]  If 7PM-7AM, please contact night-coverage [email or web address] Password [email or web address] 12/25/2018, 11:37 AM

## 2018-12-25 NOTE — Progress Notes (Signed)
[name] for warfarin Indication: Afib, mechanical aortic valve  Allergies  Allergen Reactions  . Metformin And Related     Renal issues  . Tikosyn [Dofetilide]     Passed out  . Caffeine Other (See Comments)    Affects heart    Patient Measurements: Height: 5\' 6"  (167.6 cm) Weight: 135 lb 5.8 oz (61.4 kg) IBW/kg (Calculated) : 59.3  Vital Signs: Temp: 97.7 F (36.5 C) (10/19 0505) Temp Source: Oral (10/19 0505) BP: 124/76 (10/19 0505) Pulse Rate: 69 (10/19 0505)  Labs: Recent Labs    12/23/18 0350 12/24/18 0402 12/25/18 0320  HGB 10.5* 10.8* 10.9*  HCT 34.2* 35.3* 36.6  PLT 276 260 247  LABPROT 35.3* 35.7* 38.8*  INR 3.6* 3.6* 4.1*  CREATININE 1.28* 1.24* 1.08*    Estimated Creatinine Clearance: 40.8 mL/min (A) (by C-G formula based on SCr of 1.08 mg/dL (H)).  Assessment: 59 yoF with PMH Afib and aortic valve replacement on warfarin PTA, HFrEF (35%) s/p AICD, DM2, HTN, diverticulosis, anemia, presents with diarrhea and abdominal pain. Recently treated for diverticulitis with 2 courses of abx; current CT shows persistent diverticulitis of giant colonic diverticulum. Admitted for IV abx; pharmacy consulted to resume warfarin while admitted.   Baseline INR therapeutic  Prior anticoagulation: warfarin 1.5 mg daily except 1 mg Thurs & Sun, LD 10/14  Significant events:  Today, 12/25/2018:  CBC: Hgb 10.9 stable.  Plts WNL and steady  INR up to 4.1; still supratherapeutic after dose held x 2 days (LD 10/16)  Major drug interactions: amiodarone PTA (chronic med); broad-spectrum abx  No bleeding issues per nursing  Advancing diet to promote PO intake  Goal of Therapy: INR 2-3 per coumadin clinic notes  Plan:  Continue to hold warfarin  Daily INR  CBC at least q72 hr while on warfarin Monitor for signs of bleeding or thrombosis  [name], Pharm.D [phone] 12/25/2018 9:36 AM

## 2018-12-25 NOTE — Progress Notes (Signed)
Bladder scan shows 217 cc's. Pt assisted to [name] where she voided 400 cc's of dark tea colored urine. No complaints of discomfort at this time.

## 2018-12-25 NOTE — NC FL2 (Signed)
[place] LEVEL OF CARE SCREENING TOOL     IDENTIFICATION  Patient Name: [name] Birthdate: [birth year] Sex: female Admission Date (Current Location): 12/21/2018  [facility] and [place] Number:  Herbalist and Address:  [facility],  [place] [address], [place]      Provider Number: [id number]  Attending Physician Name and Address:  Shelly Coss, MD  Relative Name and Phone Number:       Current Level of Care: Hospital Recommended Level of Care: [place] Prior Approval Number:    Date Approved/Denied:   PASRR Number: [id number]  Discharge Plan: SNF    Current Diagnoses: Patient Active Problem List   Diagnosis Date Noted  . Diverticulitis 12/21/2018  . Sepsis ([place]) 12/21/2018  . Pain due to onychomycosis of toenails of both feet 10/03/2018  . Coagulation disorder ([place]) 10/03/2018  . Diabetes mellitus without complication ([place]) 64/40/3474  . ICD (implantable cardioverter-defibrillator) in place 11/28/2012  . Kidney disease, chronic, stage III (GFR 30-59 ml/min) 10/19/2012  . 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 Back pain, thoracic 02/21/2012  . Ischemic cardiomyopathy 01/26/2012  . Carcinoma in situ of vulva   . Fibroid   . Ovarian cyst, left   . Osteoporosis, idiopathic   . 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 GERD (gastroesophageal reflux disease)   . Encounter for long-term (current) use of anticoagulants 06/08/2010  . Mitral valve disease 04/16/2010  . VITAMIN D DEFICIENCY 11/05/2008  . VITAMIN B12 DEFICIENCY 10/22/2008  . Essential hypertension, benign 07/30/2008  . Celiac disease 08/09/2007  . HYPERLIPIDEMIA 07/21/2007  . Atrial fibrillation ([place]) 07/21/2007  . CHOLELITHIASIS 07/21/2007  . UNSPECIFIED IRON DEFICIENCY ANEMIA 06/06/2007    Orientation RESPIRATION BLADDER Height &  Weight     Self, Time, Situation, Place  Normal Continent Weight: 61.4 kg Height:  5\' 6"  (167.6 cm)  BEHAVIORAL SYMPTOMS/MOOD NEUROLOGICAL BOWEL NUTRITION STATUS      Continent Diet  AMBULATORY STATUS COMMUNICATION OF NEEDS Skin   Extensive Assist Verbally Normal                       Personal Care Assistance Level of Assistance  Bathing, Dressing, Total care Bathing Assistance: Maximum assistance   Dressing Assistance: Maximum assistance Total Care Assistance: Maximum assistance   Functional Limitations Info             SPECIAL CARE FACTORS FREQUENCY  PT (By licensed PT), OT (By licensed OT)     PT Frequency: 5x weekly OT Frequency: 5x weekly            Contractures Contractures Info: Not present    Additional Factors Info  Code Status, Allergies Code Status Info: full Allergies Info: metformin, Tikosyn, caffeine           Current Medications (12/25/2018):  This is the current hospital active medication list Current Facility-Administered Medications  Medication Dose Route Frequency Provider Last Rate Last Dose  . acetaminophen (TYLENOL) tablet 650 mg  650 mg Oral Q6H PRN Arrien, Jimmy Picket, MD       Or  . acetaminophen (TYLENOL) suppository 650 mg  650 mg Rectal Q6H PRN Arrien, Jimmy Picket, MD      . amiodarone (PACERONE) tablet 100 mg  100 mg Oral Daily Tawni Millers, MD   100 mg at 12/25/18 0936  . carvedilol (COREG) tablet 12.5 mg  12.5  mg Oral BID WC Arrien, Jimmy Picket, MD   12.5 mg at 12/25/18 0737  . insulin aspart (novoLOG) injection 0-9 Units  0-9 Units Subcutaneous TID WC Arrien, Jimmy Picket, MD   1 Units at 12/25/18 1210  . morphine 2 MG/ML injection 1 mg  1 mg Intravenous Q2H PRN Arrien, Jimmy Picket, MD      . ondansetron [facility]) tablet 4 mg  4 mg Oral Q6H PRN Arrien, Jimmy Picket, MD       Or  . ondansetron [facility]) injection 4 mg  4 mg Intravenous Q6H PRN Arrien, Jimmy Picket, MD      .  piperacillin-tazobactam (ZOSYN) IVPB 3.375 g  3.375 g Intravenous Q8H Wofford, Drew A, RPH 12.5 mL/hr at 12/25/18 1310 3.375 g at 12/25/18 1310  . polyethylene glycol (MIRALAX / GLYCOLAX) packet 17 g  17 g Oral Daily Shelly Coss, MD   17 g at 12/25/18 0936  . vitamin B-12 (CYANOCOBALAMIN) tablet 2,500 mcg  2,500 mcg Oral Q M,W,F Arrien, Jimmy Picket, MD   2,500 mcg at 12/25/18 0935  . Warfarin - Pharmacist Dosing Inpatient   Does not apply q1800 Polly Cobia [facility]         Discharge Medications: Please see discharge summary for a list of discharge medications.  Relevant Imaging Results:  Relevant Lab Results:   Additional Information SSN [id number]  Joaquin Courts, RN

## 2018-12-25 NOTE — Progress Notes (Signed)
CRITICAL VALUE ALERT  Critical Value: INR 4.1  Date & Time Notied:  0725  12/25/18  Provider Notified: Texted Dr. Tawanna Solo  Orders Received/Actions taken: Pending orders

## 2018-12-26 ENCOUNTER — Telehealth: Payer: Self-pay | Admitting: *Deleted

## 2018-12-26 LAB — PROTIME-INR
INR: 3.1 — ABNORMAL HIGH (ref 0.8–1.2)
Prothrombin Time: 31.3 seconds — ABNORMAL HIGH (ref 11.4–15.2)

## 2018-12-26 LAB — GLUCOSE, CAPILLARY
Glucose-Capillary: 123 mg/dL — ABNORMAL HIGH (ref 70–99)
Glucose-Capillary: 211 mg/dL — ABNORMAL HIGH (ref 70–99)
Glucose-Capillary: 194 mg/dL — ABNORMAL HIGH (ref 70–99)
Glucose-Capillary: 195 mg/dL — ABNORMAL HIGH (ref 70–99)

## 2018-12-26 LAB — SARS CORONAVIRUS 2 (TAT 6-24 HRS): SARS Coronavirus 2: NEGATIVE

## 2018-12-26 MED ORDER — INFLUENZA VAC A&B SA ADJ QUAD 0.5 ML IM PRSY
0.5000 mL | PREFILLED_SYRINGE | INTRAMUSCULAR | Status: AC
  Administered 2018-12-26: 0.5 mL via INTRAMUSCULAR
  Filled 2018-12-26: qty 0.5

## 2018-12-26 MED ORDER — WARFARIN SODIUM 1 MG PO TABS: 1.0000 mg | ORAL_TABLET | Freq: Once | ORAL | Status: DC

## 2018-12-26 MED ORDER — INFLUENZA VAC A&B SA ADJ QUAD 0.5 ML IM PRSY: 0.5000 mL | PREFILLED_SYRINGE | INTRAMUSCULAR | Status: DC

## 2018-12-26 MED ORDER — WARFARIN SODIUM 1 MG PO TABS
1.0000 mg | ORAL_TABLET | Freq: Once | ORAL | Status: AC
  Administered 2018-12-26: 1 mg via ORAL
  Filled 2018-12-26: qty 1

## 2018-12-26 MED ORDER — AMOXICILLIN-POT CLAVULANATE 875-125 MG PO TABS
1.0000 | ORAL_TABLET | Freq: Two times a day (BID) | ORAL | Status: DC
  Administered 2018-12-26 – 2018-12-27 (×3): 1 via ORAL
  Filled 2018-12-26 (×3): qty 1

## 2018-12-26 NOTE — Progress Notes (Signed)
[place] for warfarin Indication: Afib, mechanical aortic valve  Allergies  Allergen Reactions  . Metformin And Related     Renal issues  . Tikosyn [Dofetilide]     Passed out  . Caffeine Other (See Comments)    Affects heart    Patient Measurements: Height: 5\' 6"  (167.6 cm) Weight: 135 lb 5.8 oz (61.4 kg) IBW/kg (Calculated) : 59.3  Vital Signs: Temp: 98.7 F (37.1 C) (10/19 2149) Temp Source: Oral (10/19 2149) BP: 119/50 (10/19 2149) Pulse Rate: 68 (10/19 2149)  Labs: Recent Labs    12/24/18 0402 12/25/18 0320 12/26/18 0411  HGB 10.8* 10.9*  --   HCT 35.3* 36.6  --   PLT 260 247  --   LABPROT 35.7* 38.8* 31.3*  INR 3.6* 4.1* 3.1*  CREATININE 1.24* 1.08*  --     Estimated Creatinine Clearance: 40.8 mL/min (A) (by C-G formula based on SCr of 1.08 mg/dL (H)).  Assessment: 45 yoF with PMH Afib and aortic valve replacement on warfarin PTA, HFrEF (35%) s/p AICD, DM2, HTN, diverticulosis, anemia, presents with diarrhea and abdominal pain. Recently treated for diverticulitis with 2 courses of abx; current CT shows persistent diverticulitis of giant colonic diverticulum. Admitted for IV abx; pharmacy consulted to resume warfarin while admitted.   Baseline INR therapeutic  Prior anticoagulation: warfarin 1.5 mg daily except 1 mg Thurs & Sun, LD 10/14  Significant events:  Today, 12/26/2018:  CBC: 10/19: Hgb and Plts steady  INR 3.1; still supratherapeutic after dose held x 3 days (LD 10/16), but trending down  Major drug interactions: amiodarone PTA (chronic med); broad-spectrum abx  No bleeding issues per nursing  Soft diet ordered today  Goal of Therapy: INR 2-3 per coumadin clinic notes  Plan:  Warfarin 1mg  PO tonight at 1800  Daily INR  CBC at least q72 hr while on warfarin  Monitor for signs of bleeding or thrombosis  [name], PharmD Candidate 12/26/2018 9:29 AM

## 2018-12-26 NOTE — Progress Notes (Signed)
PROGRESS NOTE    [name]  VXB:[id number] DOB: 04-Jun-1940 DOA: 12/21/2018 PCP: Velna Hatchet, MD   Brief Narrative:  Patient is a 78 year old female with history of ischemic cardiomyopathy with ejection fraction of 35%, status post AICD, A. fib status post maze procedure in 2008, diabetes mellitus, hypertension, diverticulosis, arthritis, status post mitral valve repair, aortic valve replacement who presented with abdomen pain, diarrhea.  She was diagnosed with diverticulitis as an outpatient and was on 2 rounds of antibiotics was did not help.  She presented to the emergency department  because she continued to have severe diarrhea,  poor oral intake, generalized weakness.  Abdominal CT done in the emergency department showed diverticulitis of a giant diverticulum.  She was admitted for further management. Started on IV antibiotic.  Her overall status is improved.  Currently she is tolerating diet, denies any abdomen pain, nausea or vomiting.  Antibiotics changed to oral.  Due to her debilitated condition, family requested for skilled nursing facility.  Waiting for bed availability.  Assessment & Plan:   Principal Problem:   Sepsis ([place]) Active Problems:   Essential hypertension, benign   Atrial fibrillation (HCC)   GERD (gastroesophageal reflux disease)   Kidney disease, chronic, stage III (GFR 30-59 ml/min)   Diverticulitis   Acute diverticulitis: Clinically improving, afebrile, hemodynamically stable.  Leukocytosis resolved.  CT imaging showed 7.8 x 7.3 cm rounded air-filled structure adjacent to the sigmoid colon, concerning for acute diverticulitis of a giant colonic diverticulum.  No perforation or abscess noted.  Since she is clinically improving, general surgery not consulted.  No  Abdominal pain, nausea and vomiting   .she is having bowel movements.  She is tolerating soft diet.  Antibiotics changed to oral today.  History of cholelithiasis/left-sided hydroureteronephrosis:  ultrasound showed mild hydronephrosis of the left kidney, 15 mm left kidney stone, 5.5 centimeter right renal cyst.  She follows with Dr. Lovena Neighbours.  We recommend follow-up with urology as an outpatient.  CKD stage III: Currently kidney function is at baseline.  Supratherapeutic INR:  Takes warfarin for anticoagulation for A. fib.  Currently INR is supratherapeutic.  Continue to monitor INR.  Paroxysmal A. fib: Currently normal sinus rhythm.  Rate is controlled.  Continue Coreg, amiodarone.  On warfarin for anticoagulation at home.  Chronic systolic heart failure: Status post AICD.  Currently following.  Continue gentle IV fluids for diverticulitis.  On Coreg.  Diuretics on hold  Status post mitral/aortic valve replacement: Follow-up with cardiology as outpatient.  Not sure whether the valves were mechanical in nature  Hypertension: Currently blood pressure stable.  Continue to hold home medicines for blood pressure.  Diabetes type 2: Continue sliding scale insulin.  Debility/deconditioning: She has been seen by physical therapy and recommended HHPT.Plan for SNF as per family's request.   Nutrition Problem: Inadequate oral intake Etiology: poor appetite, acute illness      DVT prophylaxis:Supratherapeutic INR Code Status: Full Family Communication: Husband  at the bedside on 12/24/18 Disposition Plan: SNF as soon as bed is available Consultants: None  Procedures: None  Antimicrobials:  Anti-infectives (From admission, onward)   Start     Dose/Rate Route Frequency Ordered Stop   12/26/18 1115  amoxicillin-clavulanate (AUGMENTIN) 875-125 MG per tablet 1 tablet     1 tablet Oral Every 12 hours 12/26/18 1103 01/03/19 0959   12/21/18 2200  piperacillin-tazobactam (ZOSYN) IVPB 3.375 g  Status:  Discontinued     3.375 g 12.5 mL/hr over 240 Minutes Intravenous Every 8 hours 12/21/18  1642 12/26/18 1103   12/21/18 2000  piperacillin-tazobactam (ZOSYN) IVPB 3.375 g  Status:  Discontinued      3.375 g 100 mL/hr over 30 Minutes Intravenous Every 6 hours 12/21/18 1953 12/21/18 1955   12/21/18 1445  piperacillin-tazobactam (ZOSYN) IVPB 3.375 g     3.375 g 12.5 mL/hr over 240 Minutes Intravenous  Once 12/21/18 1431 12/21/18 1937      Subjective:  Patient seen and examined the bedside this morning.  Hemodynamically stable.  Denies any abdominal pain, nausea or vomiting.  Having bowel movements  Objective: Vitals:   12/25/18 2140 12/25/18 2142 12/25/18 2149 12/26/18 0427  BP: (!) 119/50 (!) 119/50 (!) 119/50 128/74  Pulse: 68 68 68 72  Resp:  16 20 20   Temp: 98.7 F (37.1 C) 98.7 F (37.1 C) 98.7 F (37.1 C) 97.8 F (36.6 C)  TempSrc: Oral Oral Oral Oral  SpO2: 94% 94% 94% 92%  Weight:      Height:        Intake/Output Summary (Last 24 hours) at 12/26/2018 1104 Last data filed at 12/26/2018 0846 Gross per 24 hour  Intake 448.61 ml  Output 1025 ml  Net -576.39 ml   Filed Weights   12/23/18 0254 12/24/18 2013  Weight: 53.6 kg 61.4 kg    Examination:  General exam: Appears calm and comfortable ,Not in distress, pleasant elderly female , deconditioned/debilitated HEENT:PERRL,Oral mucosa moist, Ear/Nose normal on gross exam Respiratory system: Bilateral equal air entry, normal vesicular breath sounds, no wheezes or crackles  Cardiovascular system: S1 & S2 heard, RRR. No JVD, murmurs, rubs, gallops or clicks. No pedal edema. Gastrointestinal system: Abdomen is mildlydistended, soft and nontender.No organomegaly or masses felt. Normal bowel sounds heard. Central nervous system: Alert and oriented. No focal neurological deficits. Extremities: No edema, no clubbing ,no cyanosis, distal peripheral pulses palpable. Skin: No rashes, lesions or ulcers,no icterus ,no pallor   Data Reviewed: I have personally reviewed following labs and imaging studies  CBC: Recent Labs  Lab 12/21/18 1102 12/22/18 0348 12/23/18 0350 12/24/18 0402 12/25/18 0320  WBC 12.0* 11.3*  8.5 8.2 11.0*  NEUTROABS 11.0*  --  7.4 6.9 9.8*  HGB 11.8* 11.5* 10.5* 10.8* 10.9*  HCT 37.9 36.2 34.2* 35.3* 36.6  MCV 102.4* 102.0* 102.4* 104.1* 106.4*  PLT 323 324 276 260 270   Basic Metabolic Panel: Recent Labs  Lab 12/21/18 1102 12/22/18 0348 12/23/18 0350 12/24/18 0402 12/25/18 0320  NA 141 141 142 143 145  K 4.6 4.6 3.9 3.9 3.9  CL 104 105 106 112* 117*  CO2 27 23 24  21* 20*  GLUCOSE 181* 116* 99 96 118*  BUN 30* 26* 23 22 21   CREATININE 1.52* 1.25* 1.28* 1.24* 1.08*  CALCIUM 9.1 8.4* 8.1* 7.6* 7.2*   GFR: Estimated Creatinine Clearance: 40.8 mL/min (A) (by C-G formula based on SCr of 1.08 mg/dL (H)). Liver Function Tests: Recent Labs  Lab 12/21/18 1102  AST 15  ALT 7  ALKPHOS 29*  BILITOT 1.1  PROT 6.0*  ALBUMIN 3.1*   No results for input(s): LIPASE, AMYLASE in the last 168 hours. No results for input(s): AMMONIA in the last 168 hours. Coagulation Profile: Recent Labs  Lab 12/22/18 0348 12/23/18 0350 12/24/18 0402 12/25/18 0320 12/26/18 0411  INR 2.6* 3.6* 3.6* 4.1* 3.1*   Cardiac Enzymes: No results for input(s): CKTOTAL, CKMB, CKMBINDEX, TROPONINI in the last 168 hours. BNP (last 3 results) No results for input(s): PROBNP in the last 8760 hours. HbA1C: No results  for input(s): HGBA1C in the last 72 hours. CBG: Recent Labs  Lab 12/25/18 0734 12/25/18 1130 12/25/18 1617 12/25/18 2141 12/26/18 0729  GLUCAP 91 142* 146* 106* 123*   Lipid Profile: No results for input(s): CHOL, HDL, LDLCALC, TRIG, CHOLHDL, LDLDIRECT in the last 72 hours. Thyroid Function Tests: No results for input(s): TSH, T4TOTAL, FREET4, T3FREE, THYROIDAB in the last 72 hours. Anemia Panel: No results for input(s): VITAMINB12, FOLATE, FERRITIN, TIBC, IRON, RETICCTPCT in the last 72 hours. Sepsis Labs: No results for input(s): PROCALCITON, LATICACIDVEN in the last 168 hours.  Recent Results (from the past 240 hour(s))  SARS Coronavirus 2 by RT PCR (hospital order,  performed in [facility] hospital lab) Nasopharyngeal Nasopharyngeal Swab     Status: None   Collection Time: 12/21/18  2:56 PM   Specimen: Nasopharyngeal Swab  Result Value Ref Range Status   SARS Coronavirus 2 NEGATIVE NEGATIVE Final    Comment: (NOTE) If result is NEGATIVE SARS-CoV-2 target nucleic acids are NOT DETECTED. The SARS-CoV-2 RNA is generally detectable in upper and lower  respiratory specimens during the acute phase of infection. The lowest  concentration of SARS-CoV-2 viral copies this assay can detect is 250  copies / mL. A negative result does not preclude SARS-CoV-2 infection  and should not be used as the sole basis for treatment or other  patient management decisions.  A negative result may occur with  improper specimen collection / handling, submission of specimen other  than nasopharyngeal swab, presence of viral mutation(s) within the  areas targeted by this assay, and inadequate number of viral copies  (<250 copies / mL). A negative result must be combined with clinical  observations, patient history, and epidemiological information. If result is POSITIVE SARS-CoV-2 target nucleic acids are DETECTED. The SARS-CoV-2 RNA is generally detectable in upper and lower  respiratory specimens dur ing the acute phase of infection.  Positive  results are indicative of active infection with SARS-CoV-2.  Clinical  correlation with patient history and other diagnostic information is  necessary to determine patient infection status.  Positive results do  not rule out bacterial infection or co-infection with other viruses. If result is PRESUMPTIVE POSTIVE SARS-CoV-2 nucleic acids MAY BE PRESENT.   A presumptive positive result was obtained on the submitted specimen  and confirmed on repeat testing.  While 2019 novel coronavirus  (SARS-CoV-2) nucleic acids may be present in the submitted sample  additional confirmatory testing may be necessary for epidemiological  and / or  clinical management purposes  to differentiate between  SARS-CoV-2 and other Sarbecovirus currently known to infect humans.  If clinically indicated additional testing with an alternate test  methodology [phone]) is advised. The SARS-CoV-2 RNA is generally  detectable in upper and lower respiratory sp ecimens during the acute  phase of infection. The expected result is Negative. Fact Sheet for Patients:  StrictlyIdeas.no Fact Sheet for Healthcare Providers: BankingDealers.co.za This test is not yet approved or cleared by the [place] FDA and has been authorized for detection and/or diagnosis of SARS-CoV-2 by FDA under an Emergency Use Authorization (EUA).  This EUA will remain in effect (meaning this test can be used) for the duration of the COVID-19 declaration under Section 564(b)(1) of the Act, 21 U.S.C. section 360bbb-3(b)(1), unless the authorization is terminated or revoked sooner. Performed at [facility], [place] [address] [place], [place] [address]   Culture, Urine     Status: None   Collection Time: 12/22/18  4:49 PM  Specimen: Urine, Catheterized  Result Value Ref Range Status   Specimen Description   Final    URINE, CATHETERIZED Performed at [place] [address], [place], [place] [address]    Special Requests   Final    NONE Performed at [facility], [place] [address] [place], [place] [address]    Culture   Final    NO GROWTH Performed at [place] [facility], [place] [address] [place], [place] [address]    Report Status 12/24/2018 FINAL  Final         Radiology Studies: No results found.      Scheduled Meds: . amiodarone  100 mg Oral Daily  . amoxicillin-clavulanate  1 tablet Oral Q12H  . carvedilol  12.5 mg Oral BID WC  . feeding supplement  1 Container Oral BID BM  . feeding supplement (ENSURE ENLIVE)  237 mL Oral BID BM  . insulin  aspart  0-9 Units Subcutaneous TID WC  . multivitamin with minerals  1 tablet Oral Daily  . polyethylene glycol  17 g Oral Daily  . vitamin B-12  2,500 mcg Oral Q M,W,F  . warfarin  1 mg Oral ONCE-1800  . Warfarin - Pharmacist Dosing Inpatient   Does not apply q1800   Continuous Infusions:    LOS: 5 days    Time spent:35 mins. More than 50% of that time was spent in counseling and/or coordination of care.      Shelly Coss, MD [facility] Pager [phone]  If 7PM-7AM, please contact night-coverage [email or web address] Password [email or web address] 12/26/2018, 11:04 AM

## 2018-12-26 NOTE — Telephone Encounter (Signed)
Pt's husband called to inform the Anticoagulation Clinic that the pt has been admitted to [facility] and she will be discharged tomorrow to [facility]. He states she has been sick with stomach issues and very weak and not eating. Advised that I will cancel he appt she has tomorrow and that once she is discharged from [facility] to call back so we can resume care. Also, advised that if her Warfarin dose changes while at [facility] to update [place] and he verbalized understanding.

## 2018-12-26 NOTE — Progress Notes (Signed)
Physical Therapy Treatment Patient Details Name: [name] MRN: [id number] DOB: April 10, 1940 Today's Date: 12/26/2018    History of Present Illness Pt admitted through ED with c/p weaknes and dx with sepsis 2* acute Diverticulitis and supratherapeutic INR.  Pt with hx of CVA, DM, CHF, AICD placement, CABG and ischemic cardiomyopathy    PT Comments    Pt in bed AxO x 4.  Pleasant.  C/O "bottom" discomfort.  Pt wanting to get OOB.  Assisted to EOB partially when noted uncontrolled, watery, loose stools.  NT called to assist.  Assisted OOB to [facility]. General bed mobility comments: HOB elevated and use of bed pad to complete scooting to EOB.  Once EOB upright, pt was able to static sit at Supervision level. General transfer comment: assisted from elevated bed to [facility] with 25% VC's on proper hand placement and turn completion.  25 % vc's proper hand placement from [facility] to walker with Mod posterior lean.  Assisted with peri care.General Gait Details: 25% VC's on safety with walker and upright posture.  Recliner following for safety.  Distance limited by fatigue. Positioned in recliner.  Spouse arrived and stated pt was approved to D/C to SNF. Will update LPT.  Follow Up Recommendations  SNF     Equipment Recommendations  None recommended by PT    Recommendations for Other Services       Precautions / Restrictions Precautions Precautions: Fall Restrictions Weight Bearing Restrictions: No    Mobility  Bed Mobility Overal bed mobility: Needs Assistance Bed Mobility: Supine to Sit     Supine to sit: Mod assist;+2 for safety/equipment     General bed mobility comments: HOB elevated and use of bed pad to complete scooting to EOB.  Once EOB upright, pt was able to static sit at Supervision level.  Transfers Overall transfer level: Needs assistance Equipment used: Rolling walker (2 wheeled);None Transfers: Sit to/from [facility] Sit to Stand: Mod assist;+2  safety/equipment Stand pivot transfers: Mod assist;+2 safety/equipment       General transfer comment: assisted from elevated bed to [facility] with 25% VC's on proper hand placement and turn completion.  25 % vc's proper hand placement from [facility] to walker with Mod posterior lean.  Assisted with peri care.  Ambulation/Gait Ambulation/Gait assistance: Min assist;Mod assist;+2 safety/equipment Gait Distance (Feet): 22 Feet Assistive device: Rolling walker (2 wheeled) Gait Pattern/deviations: Step-to pattern;Step-through pattern;Decreased step length - right;Decreased step length - left;Shuffle;Trunk flexed Gait velocity: decr   General Gait Details: 25% VC's on safety with walker and upright posture.  Recliner following for safety.  Distance limited by fatigue.   Stairs             Wheelchair Mobility    Modified Rankin (Stroke Patients Only)       Balance                                            Cognition Arousal/Alertness: Awake/alert Behavior During Therapy: WFL for tasks assessed/performed Overall Cognitive Status: Within Functional Limits for tasks assessed                                        Exercises      General Comments        Pertinent Vitals/Pain Pain Assessment: Faces  Faces Pain Scale: Hurts a little bit Pain Location: "bottom" Pain Descriptors / Indicators: Aching Pain Intervention(s): Monitored during session;Repositioned    Home Living                      Prior Function            PT Goals (current goals can now be found in the care plan section) Progress towards PT goals: Progressing toward goals    Frequency    Min 3X/week      PT Plan Current plan remains appropriate    Co-evaluation              AM-PAC PT "6 Clicks" Mobility   Outcome Measure  Help needed turning from your back to your side while in a flat bed without using bedrails?: A Lot Help needed moving from lying on  your back to sitting on the side of a flat bed without using bedrails?: A Lot Help needed moving to and from a bed to a chair (including a wheelchair)?: A Lot Help needed standing up from a chair using your arms (e.g., wheelchair or bedside chair)?: A Lot Help needed to walk in hospital room?: A Lot Help needed climbing 3-5 steps with a railing? : Total 6 Click Score: 11    End of Session Equipment Utilized During Treatment: Gait belt Activity Tolerance: Patient limited by fatigue Patient left: in chair;with call bell/phone within reach;with chair alarm set;with family/visitor present Nurse Communication: Mobility status PT Visit Diagnosis: Muscle weakness (generalized) (M62.81);Difficulty in walking, not elsewhere classified (R26.2)     Time: 1005-1030 PT Time Calculation (min) (ACUTE ONLY): 25 min  Charges:  $Gait Training: 8-22 mins $Therapeutic Activity: 8-22 mins                     Rica Koyanagi  PTA Acute  Rehabilitation Services Pager      [phone] Office      [phone]

## 2018-12-27 DIAGNOSIS — M2550 Pain in unspecified joint: Secondary | ICD-10-CM | POA: Diagnosis not present

## 2018-12-27 DIAGNOSIS — I428 Other cardiomyopathies: Secondary | ICD-10-CM | POA: Diagnosis not present

## 2018-12-27 DIAGNOSIS — E1129 Type 2 diabetes mellitus with other diabetic kidney complication: Secondary | ICD-10-CM | POA: Diagnosis not present

## 2018-12-27 DIAGNOSIS — I5022 Chronic systolic (congestive) heart failure: Secondary | ICD-10-CM | POA: Diagnosis present

## 2018-12-27 DIAGNOSIS — D539 Nutritional anemia, unspecified: Secondary | ICD-10-CM | POA: Diagnosis present

## 2018-12-27 DIAGNOSIS — R1312 Dysphagia, oropharyngeal phase: Secondary | ICD-10-CM | POA: Diagnosis not present

## 2018-12-27 DIAGNOSIS — J90 Pleural effusion, not elsewhere classified: Secondary | ICD-10-CM | POA: Diagnosis not present

## 2018-12-27 DIAGNOSIS — R6520 Severe sepsis without septic shock: Secondary | ICD-10-CM | POA: Diagnosis present

## 2018-12-27 DIAGNOSIS — R531 Weakness: Secondary | ICD-10-CM | POA: Diagnosis not present

## 2018-12-27 DIAGNOSIS — E875 Hyperkalemia: Secondary | ICD-10-CM | POA: Diagnosis present

## 2018-12-27 DIAGNOSIS — Z952 Presence of prosthetic heart valve: Secondary | ICD-10-CM | POA: Diagnosis not present

## 2018-12-27 DIAGNOSIS — R2689 Other abnormalities of gait and mobility: Secondary | ICD-10-CM | POA: Diagnosis not present

## 2018-12-27 DIAGNOSIS — R278 Other lack of coordination: Secondary | ICD-10-CM | POA: Diagnosis not present

## 2018-12-27 DIAGNOSIS — J9621 Acute and chronic respiratory failure with hypoxia: Secondary | ICD-10-CM | POA: Diagnosis present

## 2018-12-27 DIAGNOSIS — R4189 Other symptoms and signs involving cognitive functions and awareness: Secondary | ICD-10-CM | POA: Diagnosis not present

## 2018-12-27 DIAGNOSIS — E870 Hyperosmolality and hypernatremia: Secondary | ICD-10-CM | POA: Diagnosis present

## 2018-12-27 DIAGNOSIS — I480 Paroxysmal atrial fibrillation: Secondary | ICD-10-CM | POA: Diagnosis not present

## 2018-12-27 DIAGNOSIS — Z66 Do not resuscitate: Secondary | ICD-10-CM | POA: Diagnosis present

## 2018-12-27 DIAGNOSIS — R41841 Cognitive communication deficit: Secondary | ICD-10-CM | POA: Diagnosis not present

## 2018-12-27 DIAGNOSIS — R52 Pain, unspecified: Secondary | ICD-10-CM | POA: Diagnosis not present

## 2018-12-27 DIAGNOSIS — N179 Acute kidney failure, unspecified: Secondary | ICD-10-CM | POA: Diagnosis not present

## 2018-12-27 DIAGNOSIS — N1330 Unspecified hydronephrosis: Secondary | ICD-10-CM | POA: Diagnosis not present

## 2018-12-27 DIAGNOSIS — A419 Sepsis, unspecified organism: Secondary | ICD-10-CM | POA: Diagnosis present

## 2018-12-27 DIAGNOSIS — Z20828 Contact with and (suspected) exposure to other viral communicable diseases: Secondary | ICD-10-CM | POA: Diagnosis present

## 2018-12-27 DIAGNOSIS — I4891 Unspecified atrial fibrillation: Secondary | ICD-10-CM | POA: Diagnosis present

## 2018-12-27 DIAGNOSIS — R410 Disorientation, unspecified: Secondary | ICD-10-CM | POA: Diagnosis not present

## 2018-12-27 DIAGNOSIS — N183 Chronic kidney disease, stage 3 unspecified: Secondary | ICD-10-CM | POA: Diagnosis present

## 2018-12-27 DIAGNOSIS — R4182 Altered mental status, unspecified: Secondary | ICD-10-CM | POA: Diagnosis present

## 2018-12-27 DIAGNOSIS — I130 Hypertensive heart and chronic kidney disease with heart failure and stage 1 through stage 4 chronic kidney disease, or unspecified chronic kidney disease: Secondary | ICD-10-CM | POA: Diagnosis present

## 2018-12-27 DIAGNOSIS — N390 Urinary tract infection, site not specified: Secondary | ICD-10-CM | POA: Diagnosis present

## 2018-12-27 DIAGNOSIS — R404 Transient alteration of awareness: Secondary | ICD-10-CM | POA: Diagnosis not present

## 2018-12-27 DIAGNOSIS — I255 Ischemic cardiomyopathy: Secondary | ICD-10-CM | POA: Diagnosis present

## 2018-12-27 DIAGNOSIS — E118 Type 2 diabetes mellitus with unspecified complications: Secondary | ICD-10-CM | POA: Diagnosis not present

## 2018-12-27 DIAGNOSIS — I129 Hypertensive chronic kidney disease with stage 1 through stage 4 chronic kidney disease, or unspecified chronic kidney disease: Secondary | ICD-10-CM | POA: Diagnosis not present

## 2018-12-27 DIAGNOSIS — K5792 Diverticulitis of intestine, part unspecified, without perforation or abscess without bleeding: Secondary | ICD-10-CM | POA: Diagnosis not present

## 2018-12-27 DIAGNOSIS — R0902 Hypoxemia: Secondary | ICD-10-CM | POA: Diagnosis not present

## 2018-12-27 DIAGNOSIS — J9622 Acute and chronic respiratory failure with hypercapnia: Secondary | ICD-10-CM | POA: Diagnosis present

## 2018-12-27 DIAGNOSIS — I2510 Atherosclerotic heart disease of native coronary artery without angina pectoris: Secondary | ICD-10-CM | POA: Diagnosis present

## 2018-12-27 DIAGNOSIS — J9601 Acute respiratory failure with hypoxia: Secondary | ICD-10-CM | POA: Diagnosis not present

## 2018-12-27 DIAGNOSIS — R293 Abnormal posture: Secondary | ICD-10-CM | POA: Diagnosis not present

## 2018-12-27 DIAGNOSIS — G9341 Metabolic encephalopathy: Secondary | ICD-10-CM | POA: Diagnosis not present

## 2018-12-27 DIAGNOSIS — Z7401 Bed confinement status: Secondary | ICD-10-CM | POA: Diagnosis not present

## 2018-12-27 DIAGNOSIS — E1122 Type 2 diabetes mellitus with diabetic chronic kidney disease: Secondary | ICD-10-CM | POA: Diagnosis present

## 2018-12-27 DIAGNOSIS — S22000A Wedge compression fracture of unspecified thoracic vertebra, initial encounter for closed fracture: Secondary | ICD-10-CM | POA: Diagnosis not present

## 2018-12-27 DIAGNOSIS — I10 Essential (primary) hypertension: Secondary | ICD-10-CM | POA: Diagnosis not present

## 2018-12-27 DIAGNOSIS — D538 Other specified nutritional anemias: Secondary | ICD-10-CM | POA: Diagnosis not present

## 2018-12-27 DIAGNOSIS — Z515 Encounter for palliative care: Secondary | ICD-10-CM | POA: Diagnosis present

## 2018-12-27 DIAGNOSIS — L89151 Pressure ulcer of sacral region, stage 1: Secondary | ICD-10-CM | POA: Diagnosis present

## 2018-12-27 DIAGNOSIS — R310 Gross hematuria: Secondary | ICD-10-CM | POA: Diagnosis not present

## 2018-12-27 DIAGNOSIS — E1165 Type 2 diabetes mellitus with hyperglycemia: Secondary | ICD-10-CM | POA: Diagnosis present

## 2018-12-27 DIAGNOSIS — R0989 Other specified symptoms and signs involving the circulatory and respiratory systems: Secondary | ICD-10-CM | POA: Diagnosis not present

## 2018-12-27 DIAGNOSIS — R2681 Unsteadiness on feet: Secondary | ICD-10-CM | POA: Diagnosis not present

## 2018-12-27 DIAGNOSIS — E872 Acidosis: Secondary | ICD-10-CM | POA: Diagnosis present

## 2018-12-27 DIAGNOSIS — I6389 Other cerebral infarction: Secondary | ICD-10-CM | POA: Diagnosis not present

## 2018-12-27 DIAGNOSIS — M6281 Muscle weakness (generalized): Secondary | ICD-10-CM | POA: Diagnosis not present

## 2018-12-27 DIAGNOSIS — Z23 Encounter for immunization: Secondary | ICD-10-CM | POA: Diagnosis not present

## 2018-12-27 DIAGNOSIS — R600 Localized edema: Secondary | ICD-10-CM | POA: Diagnosis not present

## 2018-12-27 LAB — PROTIME-INR
INR: 2.2 — ABNORMAL HIGH (ref 0.8–1.2)
Prothrombin Time: 24.1 seconds — ABNORMAL HIGH (ref 11.4–15.2)

## 2018-12-27 LAB — GLUCOSE, CAPILLARY
Glucose-Capillary: 189 mg/dL — ABNORMAL HIGH (ref 70–99)
Glucose-Capillary: 212 mg/dL — ABNORMAL HIGH (ref 70–99)

## 2018-12-27 MED ORDER — AMOXICILLIN-POT CLAVULANATE 875-125 MG PO TABS: 1.0000 | ORAL_TABLET | Freq: Two times a day (BID) | ORAL | 0 refills | Status: AC

## 2018-12-27 NOTE — TOC Transition Note (Signed)
Transition of Care [facility]) - CM/SW Discharge Note   Patient Details  Name: [name] MRN: [id number] Date of Birth: Aug 11, 1940  [facility] CM/SW Contact:  Wende Neighbors, LCSW Phone Number: 12/27/2018, 11:42 AM   Clinical Narrative:  Patient to discharge to [facility] Spouse is aware of discharge plans. RN to please call [phone] (rm# [phone]) for report.      Final next level of care: Skilled Nursing Facility Barriers to Discharge: No Barriers Identified   Patient Goals and CMS Choice Patient states their goals for this hospitalization and ongoing recovery are:: to go to rehab and get stronger CMS Medicare.gov Compare Post Acute Care list provided to:: Patient Represenative (must comment) Choice offered to / list presented to : Spouse  Discharge Placement              Patient chooses bed at: [facility] Patient to be transferred to facility by: ptar Name of family member notified: spouse is aware Patient and family notified of of transfer: 12/27/18  Discharge Plan and Services   Discharge Planning Services: CM Consult            DME Arranged: N/A DME Agency: NA       HH Arranged: NA HH Agency: NA        Social Determinants of Health ([place]) Interventions     Readmission Risk Interventions No flowsheet data found.

## 2018-12-27 NOTE — Discharge Summary (Signed)
Physician Discharge Summary  [name] PXT:[id number] DOB: April 23, 1940 DOA: 12/21/2018  PCP: Velna Hatchet, MD  Admit date: 12/21/2018 Discharge date: 12/27/2018  Admitted From: Home Disposition:  Home  Discharge Condition:Stable CODE STATUS:FULL Diet recommendation: Heart Healthy  Brief/Interim Summary:  Patient is a 78 year old female with history of ischemic cardiomyopathy with ejection fraction of 35%, status post AICD, A. fib status post maze procedure in 2008, diabetes mellitus, hypertension, diverticulosis, arthritis, status post mitral valve repair, aortic valve replacement who presented with abdomen pain, diarrhea.  She was diagnosed with diverticulitis as an outpatient and was on 2 rounds of antibiotics was did not help.  She presented to the emergency department  because she continued to have severe diarrhea,  poor oral intake, generalized weakness.  Abdominal CT done in the emergency department showed diverticulitis of a giant diverticulum.  She was admitted for further management. Started on IV antibiotic.  Her overall status is improved.  Currently she is tolerating diet, denies any abdomen pain, nausea or vomiting.  Antibiotics changed to oral.  Due to her debilitated condition, family requested for skilled nursing facility.  She is hemodynamically stable for discharge to skilled nursing facility today.  Following problems were addressed during her hospitalization:  Acute diverticulitis: Clinically improved, afebrile, hemodynamically stable.  Leukocytosis resolved.  CT imaging showed 7.8 x 7.3 cm rounded air-filled structure adjacent to the sigmoid colon, concerning for acute diverticulitis of a giant colonic diverticulum.No perforation or abscess noted.  Since she is clinically improving, general surgery not consulted.  No  Abdominal pain, nausea and vomiting   .she is having bowel movements.  She is tolerating soft diet.  Antibiotics changed to oral.  History of  cholelithiasis/left-sided hydroureteronephrosis: ultrasound showed mild hydronephrosis of the left kidney, 15 mm left kidney stone, 5.5 centimeter right renal cyst.  She follows with Dr. Lovena Neighbours.  We recommend follow-up with urology as an outpatient.  CKD stage III: Currently kidney function is at baseline.  Supratherapeutic INR:  Takes warfarin for anticoagulation for A. fib.   Continue to monitor INR.  Paroxysmal A. fib: Currently normal sinus rhythm.  Rate is controlled.  Continue Coreg, amiodarone.  On warfarin for anticoagulation at home.  Chronic systolic heart failure: Status post AICD.  Currently following.  Continue gentle IV fluids for diverticulitis.  On Coreg,lasix.  Status post mitral/aortic valve replacement: Follow-up with cardiology as outpatient.   Hypertension: Currently blood pressure stable.  resume home meds.  Diabetes type 2: Continue home regimen.  Debility/deconditioning: She has been seen by physical therapy and recommended HHPT.Being discharged to SNF as per family's request.   Discharge Diagnoses:  Principal Problem:   Sepsis ([place]) Active Problems:   Essential hypertension, benign   Atrial fibrillation (HCC)   GERD (gastroesophageal reflux disease)   Kidney disease, chronic, stage III (GFR 30-59 ml/min)   Diverticulitis    Discharge Instructions  Discharge Instructions    Diet - low sodium heart healthy   Complete by: As directed    Discharge instructions   Complete by: As directed    1) Please take prescribed as instructed. 2) Do a CBC and BMP test in a week. 3)Follow up with your gastroenterologist in 4 to 6 weeks.   Increase activity slowly   Complete by: As directed      Allergies as of 12/27/2018      Reactions   Metformin And Related    Renal issues   Tikosyn [dofetilide]    Passed out   Caffeine Other (See  Comments)   Affects heart      Medication List    TAKE these medications   amoxicillin-clavulanate 875-125 MG  tablet Commonly known as: AUGMENTIN Take 1 tablet by mouth every 12 (twelve) hours for 7 days.   carvedilol 12.5 MG tablet Commonly known as: COREG TAKE 1 TABLET BY MOUTH TWICE A DAY WITH A MEAL What changed: See the new instructions.   ergocalciferol 1.25 MG (50000 UT) capsule Commonly known as: VITAMIN D2 Take 50,000 Units by mouth once a week. Monday   furosemide 20 MG tablet Commonly known as: LASIX TAKE ONE TABLET ONCE DAILY AND TAKE 1 EXTRA AS NEEDED What changed: See the new instructions.   losartan 25 MG tablet Commonly known as: COZAAR Take 25 mg by mouth 2 (two) times daily.   NON FORMULARY ([facility]) Take 500 mg of elemental calcium by mouth at bedtime   Pacerone 200 MG tablet Generic drug: amiodarone TAKE 1/2 TABLET BY MOUTH DAILY. What changed: how much to take   Vitamin B-12 2500 MCG Subl Place 1 tablet under the tongue every Monday, Wednesday, and Friday.   warfarin 3 MG tablet Commonly known as: COUMADIN Take as directed. If you are unsure how to take this medication, talk to your nurse or doctor. Original instructions: TAKE 1/2 TO 1 TABLET BY MOUTH AS DIRECTED BY COUMADIN CLINIC What changed: See the new instructions.      Follow-up Information    Velna Hatchet, MD. Schedule an appointment as soon as possible for a visit in 1 week(s).   Specialty: Internal Medicine Contact information: [place] [address] [phone]          Allergies  Allergen Reactions  . Metformin And Related     Renal issues  . Tikosyn [Dofetilide]     Passed out  . Caffeine Other (See Comments)    Affects heart    Consultations:  None   Procedures/Studies: US Renal  Result Date: 12/23/2018 CLINICAL DATA:  Evaluate left-sided hydronephrosis. EXAM: RENAL / URINARY TRACT ULTRASOUND COMPLETE COMPARISON:  CT scan December 21, 2018 FINDINGS: Right Kidney: Renal measurements: 9.7 x 3.9 x 4.1 cm = volume: 81.9 mL. A 5.5 cm cyst was measured  on the right. No solid masses identified. A 7 mm stone is noted. No hydronephrosis. Left Kidney: Renal measurements: 9.9 x 3.6 x 3.6 cm = volume: 65.5 mL. Mild hydronephrosis. Left-sided stones are seen. The largest stone measures 15 mm. Bladder: Appears normal for degree of bladder distention. Other: Calcified fibroids are seen in the uterus. IMPRESSION: 1. Mild hydronephrosis remains on the left. Stones are seen in the left kidney. The largest stone measures 15 mm. 2. No hydronephrosis on the right. There is a 7 mm stone on the right. 3. A 5.5 cm cyst was measured on the right. The etiology of this finding is unclear as there is no right renal cyst on the CT scan from December 21, 2018. 4. Fibroid uterus. Electronically Signed   By: Dorise Bullion III M.D   On: 12/23/2018 17:30   Dg Chest Port 1 View  Result Date: 12/21/2018 CLINICAL DATA:  Weakness EXAM: PORTABLE CHEST 1 VIEW COMPARISON:  11/28/2017 FINDINGS: Left AICD remains in place, unchanged. Prior median sternotomy and valve replacement. Cardiomegaly. Prominence of the right hilum could be vascular although adenopathy cannot be excluded. No confluent opacities, effusions or edema. No acute bony abnormality. IMPRESSION: Cardiomegaly. Prominence of the right hilum felt to most likely be related to vascular congestion  although adenopathy cannot be completely excluded. This could be further evaluated with chest CT with IV contrast if felt clinically indicated. Electronically Signed   By: Rolm Baptise M.D.   On: 12/21/2018 10:20   Ct Renal Stone Study  Result Date: 12/21/2018 CLINICAL DATA:  Weakness, flank pain EXAM: CT ABDOMEN AND PELVIS WITHOUT CONTRAST TECHNIQUE: Multidetector CT imaging of the abdomen and pelvis was performed following the standard protocol without IV contrast. COMPARISON:  03/31/2018 FINDINGS: Lower chest: Cardiomegaly. Lung bases clear. Hepatobiliary: Liver is within normal limits. Numerous layering stones within the gallbladder  lumen. No biliary dilatation. Pancreas: Unremarkable. No pancreatic ductal dilatation or surrounding inflammatory changes. Spleen: No acute abnormality. Adrenals/Urinary Tract: Thickened appearance of the left adrenal gland is unchanged. Right adrenal gland is normal. Multiple large renal calculi bilaterally. Largest on the right measures 1.6 x 0.9 cm. Calculus within the left renal pelvis measures 1.8 x 1.0 cm. Mild-to-moderate left-sided hydronephrosis. Bilateral ureters are nondilated. 5 mm stone at the left ureterovesical junction. Urinary bladder is grossly unremarkable. 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 There is a large rounded air-filled structure adjacent to the sigmoid colon measuring 7.8 x 7.3 cm with mildly thickened wall, new from prior suggestive of a giant colonic diverticulum (series 3, image 50). There is moderate free fluid adjacent to this diverticulum. No well-defined fluid collection to suggest abscess. No evidence of perforation. Small hiatal hernia. Stomach and small bowel otherwise unremarkable. No dilated small bowel. Vascular/Lymphatic: No significant vascular findings are present. No enlarged abdominal or pelvic lymph nodes. Reproductive: Anteverted uterus with multiple coarsely calcified fibroids. No adnexal masses. Other: No abdominal wall hernia. Musculoskeletal: Multiple prior vertebral body compression deformities including T11, T12, L1, L3, and L4. No new or worsening vertebral body compression fracture compared to 03/31/2018. No acute osseous findings. IMPRESSION: 1. 5 mm left UVJ stone with mild-to-moderate left-sided hydroureteronephrosis. Multiple additional bilateral nonobstructing renal calculi. 2. New 7.8 x 7.3 cm rounded air-filled structure immediately adjacent to the sigmoid colon with mildly thickened wall and adjacent free fluid. 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 No abscess or evidence of  perforation. 3. Uncomplicated cholelithiasis. 4. Fibroid uterus. 5. Multiple prior vertebral body compression deformities. No new or worsening vertebral body compression fracture compared to prior. These results were called by telephone at the time of interpretation on 12/21/2018 at 2:14 pm to provider Veryl Speak , who verbally acknowledged these results. Electronically Signed   By: Davina Poke M.D.   On: 12/21/2018 14:16       Subjective:  Patient seen and examined at bedside this morning.  Medically stable for discharge.  Discharge Exam: Vitals:   12/26/18 2252 12/27/18 0554  BP: 117/72 120/82  Pulse: 70 78  Resp:  15  Temp:  98 F (36.7 C)  SpO2: 92% 90%   Vitals:   12/26/18 1236 12/26/18 2033 12/26/18 2252 12/27/18 0554  BP: (!) 114/52 (!) 98/55 117/72 120/82  Pulse: 73 70 70 78  Resp: 17 18  15   Temp: 98.1 F (36.7 C) 98.3 F (36.8 C)  98 F (36.7 C)  TempSrc: Oral   Oral  SpO2: 90% 90% 92% 90%  Weight:      Height:        General: Pt is alert, awake, not in acute distress Cardiovascular: RRR, S1/S2 +, no rubs, no gallops Respiratory: CTA bilaterally, no wheezing, no rhonchi Abdominal: Soft, NT, ND, bowel sounds + Extremities: no edema, no cyanosis  The results of significant diagnostics from this hospitalization (including imaging, microbiology, ancillary and laboratory) are listed below for reference.     Microbiology: Recent Results (from the past 240 hour(s))  SARS Coronavirus 2 by RT PCR (hospital order, performed in [facility] hospital lab) Nasopharyngeal Nasopharyngeal Swab     Status: None   Collection Time: 12/21/18  2:56 PM   Specimen: Nasopharyngeal Swab  Result Value Ref Range Status   SARS Coronavirus 2 NEGATIVE NEGATIVE Final    Comment: (NOTE) If result is NEGATIVE SARS-CoV-2 target nucleic acids are NOT DETECTED. The SARS-CoV-2 RNA is generally detectable in upper and lower  respiratory specimens during the acute phase of  infection. The lowest  concentration of SARS-CoV-2 viral copies this assay can detect is 250  copies / mL. A negative result does not preclude SARS-CoV-2 infection  and should not be used as the sole basis for treatment or other  patient management decisions.  A negative result may occur with  improper specimen collection / handling, submission of specimen other  than nasopharyngeal swab, presence of viral mutation(s) within the  areas targeted by this assay, and inadequate number of viral copies  (<250 copies / mL). A negative result must be combined with clinical  observations, patient history, and epidemiological information. If result is POSITIVE SARS-CoV-2 target nucleic acids are DETECTED. The SARS-CoV-2 RNA is generally detectable in upper and lower  respiratory specimens dur ing the acute phase of infection.  Positive  results are indicative of active infection with SARS-CoV-2.  Clinical  correlation with patient history and other diagnostic information is  necessary to determine patient infection status.  Positive results do  not rule out bacterial infection or co-infection with other viruses. If result is PRESUMPTIVE POSTIVE SARS-CoV-2 nucleic acids MAY BE PRESENT.   A presumptive positive result was obtained on the submitted specimen  and confirmed on repeat testing.  While 2019 novel coronavirus  (SARS-CoV-2) nucleic acids may be present in the submitted sample  additional confirmatory testing may be necessary for epidemiological  and / or clinical management purposes  to differentiate between  SARS-CoV-2 and other Sarbecovirus currently known to infect humans.  If clinically indicated additional testing with an alternate test  methodology [phone]) is advised. The SARS-CoV-2 RNA is generally  detectable in upper and lower respiratory sp ecimens during the acute  phase of infection. The expected result is Negative. Fact Sheet for Patients:   StrictlyIdeas.no Fact Sheet for Healthcare Providers: BankingDealers.co.za This test is not yet approved or cleared by the [place] FDA and has been authorized for detection and/or diagnosis of SARS-CoV-2 by FDA under an Emergency Use Authorization (EUA).  This EUA will remain in effect (meaning this test can be used) for the duration of the COVID-19 declaration under Section 564(b)(1) of the Act, 21 U.S.C. section 360bbb-3(b)(1), unless the authorization is terminated or revoked sooner. Performed at [facility], [place] [address], [place], [place] [address]   Culture, Urine     Status: None   Collection Time: 12/22/18  4:49 PM   Specimen: Urine, Catheterized  Result Value Ref Range Status   Specimen Description   Final    URINE, CATHETERIZED Performed at [place] [address] [place], [place] [address]    Special Requests   Final    NONE Performed at [facility], [place] [address] [place], [place] [address]    Culture   Final    NO GROWTH Performed at  [facility], [place] [address] [place], [place] [address]    Report Status 12/24/2018 FINAL  Final  SARS CORONAVIRUS 2 (TAT 6-24 HRS) Nasopharyngeal Nasopharyngeal Swab     Status: None   Collection Time: 12/26/18 11:01 AM   Specimen: Nasopharyngeal Swab  Result Value Ref Range Status   SARS Coronavirus 2 NEGATIVE NEGATIVE Final    Comment: (NOTE) SARS-CoV-2 target nucleic acids are NOT DETECTED. The SARS-CoV-2 RNA is generally detectable in upper and lower respiratory specimens during the acute phase of infection. Negative results do not preclude SARS-CoV-2 infection, do not rule out co-infections with other pathogens, and should not be used as the sole basis for treatment or other patient management decisions. Negative results must be combined with clinical observations, patient history, and epidemiological  information. The expected result is Negative. Fact Sheet for Patients: SugarRoll.be Fact Sheet for Healthcare Providers: https://www.woods-mathews.com/ This test is not yet approved or cleared by the [place] FDA and  has been authorized for detection and/or diagnosis of SARS-CoV-2 by FDA under an Emergency Use Authorization (EUA). This EUA will remain  in effect (meaning this test can be used) for the duration of the COVID-19 declaration under Section 56 4(b)(1) of the Act, 21 U.S.C. section 360bbb-3(b)(1), unless the authorization is terminated or revoked sooner. Performed at [facility], [place] [address], [place], [place] [address]      Labs: BNP (last 3 results) No results for input(s): BNP in the last 8760 hours. Basic Metabolic Panel: Recent Labs  Lab 12/21/18 1102 12/22/18 0348 12/23/18 0350 12/24/18 0402 12/25/18 0320  NA 141 141 142 143 145  K 4.6 4.6 3.9 3.9 3.9  CL 104 105 106 112* 117*  CO2 27 23 24  21* 20*  GLUCOSE 181* 116* 99 96 118*  BUN 30* 26* 23 22 21   CREATININE 1.52* 1.25* 1.28* 1.24* 1.08*  CALCIUM 9.1 8.4* 8.1* 7.6* 7.2*   Liver Function Tests: Recent Labs  Lab 12/21/18 1102  AST 15  ALT 7  ALKPHOS 29*  BILITOT 1.1  PROT 6.0*  ALBUMIN 3.1*   No results for input(s): LIPASE, AMYLASE in the last 168 hours. No results for input(s): AMMONIA in the last 168 hours. CBC: Recent Labs  Lab 12/21/18 1102 12/22/18 0348 12/23/18 0350 12/24/18 0402 12/25/18 0320  WBC 12.0* 11.3* 8.5 8.2 11.0*  NEUTROABS 11.0*  --  7.4 6.9 9.8*  HGB 11.8* 11.5* 10.5* 10.8* 10.9*  HCT 37.9 36.2 34.2* 35.3* 36.6  MCV 102.4* 102.0* 102.4* 104.1* 106.4*  PLT 323 324 276 260 247   Cardiac Enzymes: No results for input(s): CKTOTAL, CKMB, CKMBINDEX, TROPONINI in the last 168 hours. BNP: Invalid input(s): POCBNP CBG: Recent Labs  Lab 12/26/18 1138 12/26/18 1646 12/26/18 2147 12/27/18 0731 12/27/18 1131   GLUCAP 211* 194* 195* 189* 212*   D-Dimer No results for input(s): DDIMER in the last 72 hours. Hgb A1c No results for input(s): HGBA1C in the last 72 hours. Lipid Profile No results for input(s): CHOL, HDL, LDLCALC, TRIG, CHOLHDL, LDLDIRECT in the last 72 hours. Thyroid function studies No results for input(s): TSH, T4TOTAL, T3FREE, THYROIDAB in the last 72 hours.  Invalid input(s): FREET3 Anemia work up No results for input(s): VITAMINB12, FOLATE, FERRITIN, TIBC, IRON, RETICCTPCT in the last 72 hours. Urinalysis    Component Value Date/Time   COLORURINE YELLOW 12/22/2018 1649   APPEARANCEUR HAZY (A) 12/22/2018 1649   LABSPEC 1.011 12/22/2018 1649   PHURINE 5.0 12/22/2018 1649   GLUCOSEU  NEGATIVE 12/22/2018 1649   GLUCOSEU NEGATIVE 03/14/2012 1142   HGBUR LARGE (A) 12/22/2018 1649   HGBUR negative 11/26/2008 0934   BILIRUBINUR NEGATIVE 12/22/2018 1649   BILIRUBINUR negative 07/21/2012 0909   KETONESUR NEGATIVE 12/22/2018 1649   PROTEINUR NEGATIVE 12/22/2018 1649   UROBILINOGEN 0.2 11/11/2014 1200   NITRITE NEGATIVE 12/22/2018 1649   LEUKOCYTESUR NEGATIVE 12/22/2018 1649   Sepsis Labs Invalid input(s): PROCALCITONIN,  WBC,  LACTICIDVEN Microbiology Recent Results (from the past 240 hour(s))  SARS Coronavirus 2 by RT PCR (hospital order, performed in [facility] hospital lab) Nasopharyngeal Nasopharyngeal Swab     Status: None   Collection Time: 12/21/18  2:56 PM   Specimen: Nasopharyngeal Swab  Result Value Ref Range Status   SARS Coronavirus 2 NEGATIVE NEGATIVE Final    Comment: (NOTE) If result is NEGATIVE SARS-CoV-2 target nucleic acids are NOT DETECTED. The SARS-CoV-2 RNA is generally detectable in upper and lower  respiratory specimens during the acute phase of infection. The lowest  concentration of SARS-CoV-2 viral copies this assay can detect is 250  copies / mL. A negative result does not preclude SARS-CoV-2 infection  and should not be used as the sole  basis for treatment or other  patient management decisions.  A negative result may occur with  improper specimen collection / handling, submission of specimen other  than nasopharyngeal swab, presence of viral mutation(s) within the  areas targeted by this assay, and inadequate number of viral copies  (<250 copies / mL). A negative result must be combined with clinical  observations, patient history, and epidemiological information. If result is POSITIVE SARS-CoV-2 target nucleic acids are DETECTED. The SARS-CoV-2 RNA is generally detectable in upper and lower  respiratory specimens dur ing the acute phase of infection.  Positive  results are indicative of active infection with SARS-CoV-2.  Clinical  correlation with patient history and other diagnostic information is  necessary to determine patient infection status.  Positive results do  not rule out bacterial infection or co-infection with other viruses. If result is PRESUMPTIVE POSTIVE SARS-CoV-2 nucleic acids MAY BE PRESENT.   A presumptive positive result was obtained on the submitted specimen  and confirmed on repeat testing.  While 2019 novel coronavirus  (SARS-CoV-2) nucleic acids may be present in the submitted sample  additional confirmatory testing may be necessary for epidemiological  and / or clinical management purposes  to differentiate between  SARS-CoV-2 and other Sarbecovirus currently known to infect humans.  If clinically indicated additional testing with an alternate test  methodology [phone]) is advised. The SARS-CoV-2 RNA is generally  detectable in upper and lower respiratory sp ecimens during the acute  phase of infection. The expected result is Negative. Fact Sheet for Patients:  StrictlyIdeas.no Fact Sheet for Healthcare Providers: BankingDealers.co.za This test is not yet approved or cleared by the [place] FDA and has been authorized for detection  and/or diagnosis of SARS-CoV-2 by FDA under an Emergency Use Authorization (EUA).  This EUA will remain in effect (meaning this test can be used) for the duration of the COVID-19 declaration under Section 564(b)(1) of the Act, 21 U.S.C. section 360bbb-3(b)(1), unless the authorization is terminated or revoked sooner. Performed at [facility], [place] [address] [place], [place] [address]   Culture, Urine     Status: None   Collection Time: 12/22/18  4:49 PM   Specimen: Urine, Catheterized  Result Value Ref Range Status   Specimen Description   Final    URINE, CATHETERIZED  Performed at [facility], [place] [address], [place], [place] [address]    Special Requests   Final    NONE Performed at [facility], [place] [address], [place], [place] [address]    Culture   Final    NO GROWTH Performed at [facility], [place] [address] [place], [place] [address]    Report Status 12/24/2018 FINAL  Final  SARS CORONAVIRUS 2 (TAT 6-24 HRS) Nasopharyngeal Nasopharyngeal Swab     Status: None   Collection Time: 12/26/18 11:01 AM   Specimen: Nasopharyngeal Swab  Result Value Ref Range Status   SARS Coronavirus 2 NEGATIVE NEGATIVE Final    Comment: (NOTE) SARS-CoV-2 target nucleic acids are NOT DETECTED. The SARS-CoV-2 RNA is generally detectable in upper and lower respiratory specimens during the acute phase of infection. Negative results do not preclude SARS-CoV-2 infection, do not rule out co-infections with other pathogens, and should not be used as the sole basis for treatment or other patient management decisions. Negative results must be combined with clinical observations, patient history, and epidemiological information. The expected result is Negative. Fact Sheet for Patients: SugarRoll.be Fact Sheet for Healthcare Providers: https://www.woods-mathews.com/ This test is not yet approved  or cleared by the [place] FDA and  has been authorized for detection and/or diagnosis of SARS-CoV-2 by FDA under an Emergency Use Authorization (EUA). This EUA will remain  in effect (meaning this test can be used) for the duration of the COVID-19 declaration under Section 56 4(b)(1) of the Act, 21 U.S.C. section 360bbb-3(b)(1), unless the authorization is terminated or revoked sooner. Performed at [facility], [place] [address] [place],  [address]     Please note: You were cared for by a hospitalist during your hospital stay. Once you are discharged, your primary care physician will handle any further medical issues. Please note that NO REFILLS for any discharge medications will be authorized once you are discharged, as it is imperative that you return to your primary care physician (or establish a relationship with a primary care physician if you do not have one) for your post hospital discharge needs so that they can reassess your need for medications and monitor your lab values.    Time coordinating discharge: 40 minutes  SIGNED:   Shelly Coss, MD  [facility] 12/27/2018, 11:37 AM Pager [phone]  If 7PM-7AM, please contact night-coverage [email or web address] Password [email or web address]

## 2018-12-27 NOTE — Progress Notes (Signed)
[place] for warfarin Indication: Afib, mechanical aortic valve  Allergies  Allergen Reactions  . Metformin And Related     Renal issues  . Tikosyn [Dofetilide]     Passed out  . Caffeine Other (See Comments)    Affects heart    Patient Measurements: Height: 5\' 6"  (167.6 cm) Weight: 135 lb 5.8 oz (61.4 kg) IBW/kg (Calculated) : 59.3  Vital Signs: Temp: 98 F (36.7 C) (10/21 0554) Temp Source: Oral (10/21 0554) BP: 120/82 (10/21 0554) Pulse Rate: 78 (10/21 0554)  Labs: Recent Labs    12/25/18 0320 12/26/18 0411 12/27/18 0429  HGB 10.9*  --   --   HCT 36.6  --   --   PLT 247  --   --   LABPROT 38.8* 31.3* 24.1*  INR 4.1* 3.1* 2.2*  CREATININE 1.08*  --   --     Estimated Creatinine Clearance: 40.8 mL/min (A) (by C-G formula based on SCr of 1.08 mg/dL (H)).  Assessment: 4 yoF with PMH Afib and aortic valve replacement on warfarin PTA, HFrEF (35%) s/p AICD, DM2, HTN, diverticulosis, anemia, presents with diarrhea and abdominal pain. Recently treated for diverticulitis with 2 courses of abx; current CT shows persistent diverticulitis of giant colonic diverticulum. Admitted for IV abx; pharmacy consulted to resume warfarin while admitted.   Baseline INR therapeutic  Prior anticoagulation: warfarin 1.5 mg daily except 1 mg Thurs & Sun, LD 10/14  Significant events:  Today, 12/27/2018:  CBC: 10/19: Hgb and Plts steady  INR 2.2 - therapeutic  Major drug interactions: amiodarone PTA (chronic med); broad-spectrum abx  No bleeding issues per nursing  Soft diet ordered today  Goal of Therapy: INR 2-3 per coumadin clinic notes  Plan:  Warfarin 1.5mg  PO tonight at 1800  Daily INR  CBC at least q72 hr while on warfarin  Monitor for signs of bleeding or thrombosis  [name], PharmD Candidate 12/27/2018 11:30 AM

## 2018-12-28 DIAGNOSIS — I6389 Other cerebral infarction: Secondary | ICD-10-CM | POA: Diagnosis not present

## 2018-12-28 DIAGNOSIS — I129 Hypertensive chronic kidney disease with stage 1 through stage 4 chronic kidney disease, or unspecified chronic kidney disease: Secondary | ICD-10-CM | POA: Diagnosis not present

## 2018-12-28 DIAGNOSIS — S22000A Wedge compression fracture of unspecified thoracic vertebra, initial encounter for closed fracture: Secondary | ICD-10-CM | POA: Diagnosis not present

## 2018-12-28 DIAGNOSIS — Z952 Presence of prosthetic heart valve: Secondary | ICD-10-CM | POA: Diagnosis not present

## 2018-12-28 DIAGNOSIS — D538 Other specified nutritional anemias: Secondary | ICD-10-CM | POA: Diagnosis not present

## 2018-12-28 DIAGNOSIS — I2510 Atherosclerotic heart disease of native coronary artery without angina pectoris: Secondary | ICD-10-CM | POA: Diagnosis not present

## 2018-12-28 DIAGNOSIS — I428 Other cardiomyopathies: Secondary | ICD-10-CM | POA: Diagnosis not present

## 2018-12-28 DIAGNOSIS — K5792 Diverticulitis of intestine, part unspecified, without perforation or abscess without bleeding: Secondary | ICD-10-CM | POA: Diagnosis not present

## 2018-12-28 DIAGNOSIS — E118 Type 2 diabetes mellitus with unspecified complications: Secondary | ICD-10-CM | POA: Diagnosis not present

## 2018-12-28 DIAGNOSIS — N1330 Unspecified hydronephrosis: Secondary | ICD-10-CM | POA: Diagnosis not present

## 2018-12-28 DIAGNOSIS — I5022 Chronic systolic (congestive) heart failure: Secondary | ICD-10-CM | POA: Diagnosis not present

## 2018-12-28 DIAGNOSIS — I480 Paroxysmal atrial fibrillation: Secondary | ICD-10-CM | POA: Diagnosis not present

## 2018-12-29 ENCOUNTER — Emergency Department (HOSPITAL_COMMUNITY): Payer: Medicare Other

## 2018-12-29 ENCOUNTER — Other Ambulatory Visit: Payer: Self-pay

## 2018-12-29 ENCOUNTER — Inpatient Hospital Stay (HOSPITAL_COMMUNITY)
Admission: EM | Admit: 2018-12-29 | Discharge: 2019-01-07 | DRG: 871 | Disposition: E | Payer: Medicare Other | Attending: Internal Medicine | Admitting: Internal Medicine

## 2018-12-29 ENCOUNTER — Encounter (HOSPITAL_COMMUNITY): Payer: Self-pay | Admitting: Pulmonary Disease

## 2018-12-29 DIAGNOSIS — E875 Hyperkalemia: Secondary | ICD-10-CM | POA: Diagnosis present

## 2018-12-29 DIAGNOSIS — N390 Urinary tract infection, site not specified: Secondary | ICD-10-CM | POA: Diagnosis present

## 2018-12-29 DIAGNOSIS — J9622 Acute and chronic respiratory failure with hypercapnia: Secondary | ICD-10-CM | POA: Diagnosis present

## 2018-12-29 DIAGNOSIS — I5022 Chronic systolic (congestive) heart failure: Secondary | ICD-10-CM | POA: Diagnosis present

## 2018-12-29 DIAGNOSIS — D539 Nutritional anemia, unspecified: Secondary | ICD-10-CM | POA: Diagnosis present

## 2018-12-29 DIAGNOSIS — Z515 Encounter for palliative care: Secondary | ICD-10-CM | POA: Diagnosis present

## 2018-12-29 DIAGNOSIS — R600 Localized edema: Secondary | ICD-10-CM | POA: Diagnosis not present

## 2018-12-29 DIAGNOSIS — E870 Hyperosmolality and hypernatremia: Secondary | ICD-10-CM | POA: Diagnosis present

## 2018-12-29 DIAGNOSIS — Z66 Do not resuscitate: Secondary | ICD-10-CM | POA: Diagnosis present

## 2018-12-29 DIAGNOSIS — Z79899 Other long term (current) drug therapy: Secondary | ICD-10-CM

## 2018-12-29 DIAGNOSIS — Z87442 Personal history of urinary calculi: Secondary | ICD-10-CM

## 2018-12-29 DIAGNOSIS — Z888 Allergy status to other drugs, medicaments and biological substances status: Secondary | ICD-10-CM

## 2018-12-29 DIAGNOSIS — I4891 Unspecified atrial fibrillation: Secondary | ICD-10-CM | POA: Diagnosis present

## 2018-12-29 DIAGNOSIS — I10 Essential (primary) hypertension: Secondary | ICD-10-CM | POA: Diagnosis present

## 2018-12-29 DIAGNOSIS — Z951 Presence of aortocoronary bypass graft: Secondary | ICD-10-CM

## 2018-12-29 DIAGNOSIS — E1165 Type 2 diabetes mellitus with hyperglycemia: Secondary | ICD-10-CM | POA: Diagnosis present

## 2018-12-29 DIAGNOSIS — I130 Hypertensive heart and chronic kidney disease with heart failure and stage 1 through stage 4 chronic kidney disease, or unspecified chronic kidney disease: Secondary | ICD-10-CM | POA: Diagnosis present

## 2018-12-29 DIAGNOSIS — R2689 Other abnormalities of gait and mobility: Secondary | ICD-10-CM | POA: Diagnosis not present

## 2018-12-29 DIAGNOSIS — N183 Chronic kidney disease, stage 3 unspecified: Secondary | ICD-10-CM | POA: Diagnosis present

## 2018-12-29 DIAGNOSIS — L89151 Pressure ulcer of sacral region, stage 1: Secondary | ICD-10-CM | POA: Diagnosis present

## 2018-12-29 DIAGNOSIS — N179 Acute kidney failure, unspecified: Secondary | ICD-10-CM | POA: Diagnosis present

## 2018-12-29 DIAGNOSIS — E872 Acidosis: Secondary | ICD-10-CM | POA: Diagnosis present

## 2018-12-29 DIAGNOSIS — Z95810 Presence of automatic (implantable) cardiac defibrillator: Secondary | ICD-10-CM

## 2018-12-29 DIAGNOSIS — R4182 Altered mental status, unspecified: Secondary | ICD-10-CM | POA: Diagnosis not present

## 2018-12-29 DIAGNOSIS — R6520 Severe sepsis without septic shock: Secondary | ICD-10-CM | POA: Diagnosis present

## 2018-12-29 DIAGNOSIS — R310 Gross hematuria: Secondary | ICD-10-CM | POA: Diagnosis not present

## 2018-12-29 DIAGNOSIS — A419 Sepsis, unspecified organism: Secondary | ICD-10-CM | POA: Diagnosis present

## 2018-12-29 DIAGNOSIS — M6281 Muscle weakness (generalized): Secondary | ICD-10-CM | POA: Diagnosis not present

## 2018-12-29 DIAGNOSIS — R278 Other lack of coordination: Secondary | ICD-10-CM | POA: Diagnosis not present

## 2018-12-29 DIAGNOSIS — K219 Gastro-esophageal reflux disease without esophagitis: Secondary | ICD-10-CM | POA: Diagnosis present

## 2018-12-29 DIAGNOSIS — J9601 Acute respiratory failure with hypoxia: Secondary | ICD-10-CM | POA: Diagnosis not present

## 2018-12-29 DIAGNOSIS — G9341 Metabolic encephalopathy: Secondary | ICD-10-CM | POA: Diagnosis present

## 2018-12-29 DIAGNOSIS — Z7901 Long term (current) use of anticoagulants: Secondary | ICD-10-CM

## 2018-12-29 DIAGNOSIS — R2681 Unsteadiness on feet: Secondary | ICD-10-CM | POA: Diagnosis not present

## 2018-12-29 DIAGNOSIS — R404 Transient alteration of awareness: Secondary | ICD-10-CM | POA: Diagnosis not present

## 2018-12-29 DIAGNOSIS — E1122 Type 2 diabetes mellitus with diabetic chronic kidney disease: Secondary | ICD-10-CM | POA: Diagnosis present

## 2018-12-29 DIAGNOSIS — M1990 Unspecified osteoarthritis, unspecified site: Secondary | ICD-10-CM | POA: Diagnosis present

## 2018-12-29 DIAGNOSIS — K5792 Diverticulitis of intestine, part unspecified, without perforation or abscess without bleeding: Secondary | ICD-10-CM | POA: Diagnosis present

## 2018-12-29 DIAGNOSIS — Z8674 Personal history of sudden cardiac arrest: Secondary | ICD-10-CM

## 2018-12-29 DIAGNOSIS — R531 Weakness: Secondary | ICD-10-CM | POA: Diagnosis not present

## 2018-12-29 DIAGNOSIS — N19 Unspecified kidney failure: Secondary | ICD-10-CM

## 2018-12-29 DIAGNOSIS — R4189 Other symptoms and signs involving cognitive functions and awareness: Secondary | ICD-10-CM | POA: Diagnosis not present

## 2018-12-29 DIAGNOSIS — Z20828 Contact with and (suspected) exposure to other viral communicable diseases: Secondary | ICD-10-CM | POA: Diagnosis present

## 2018-12-29 DIAGNOSIS — Z87891 Personal history of nicotine dependence: Secondary | ICD-10-CM

## 2018-12-29 DIAGNOSIS — I2510 Atherosclerotic heart disease of native coronary artery without angina pectoris: Secondary | ICD-10-CM | POA: Diagnosis present

## 2018-12-29 DIAGNOSIS — I480 Paroxysmal atrial fibrillation: Secondary | ICD-10-CM | POA: Diagnosis not present

## 2018-12-29 DIAGNOSIS — I255 Ischemic cardiomyopathy: Secondary | ICD-10-CM | POA: Diagnosis present

## 2018-12-29 DIAGNOSIS — Z8249 Family history of ischemic heart disease and other diseases of the circulatory system: Secondary | ICD-10-CM

## 2018-12-29 DIAGNOSIS — J9621 Acute and chronic respiratory failure with hypoxia: Secondary | ICD-10-CM | POA: Diagnosis present

## 2018-12-29 DIAGNOSIS — I080 Rheumatic disorders of both mitral and aortic valves: Secondary | ICD-10-CM | POA: Diagnosis present

## 2018-12-29 DIAGNOSIS — Z833 Family history of diabetes mellitus: Secondary | ICD-10-CM

## 2018-12-29 DIAGNOSIS — Z8673 Personal history of transient ischemic attack (TIA), and cerebral infarction without residual deficits: Secondary | ICD-10-CM

## 2018-12-29 DIAGNOSIS — Z86002 Personal history of in-situ neoplasm of other and unspecified genital organs: Secondary | ICD-10-CM

## 2018-12-29 DIAGNOSIS — M810 Age-related osteoporosis without current pathological fracture: Secondary | ICD-10-CM | POA: Diagnosis present

## 2018-12-29 DIAGNOSIS — Z952 Presence of prosthetic heart valve: Secondary | ICD-10-CM

## 2018-12-29 DIAGNOSIS — L8990 Pressure ulcer of unspecified site, unspecified stage: Secondary | ICD-10-CM | POA: Insufficient documentation

## 2018-12-29 DIAGNOSIS — J90 Pleural effusion, not elsewhere classified: Secondary | ICD-10-CM | POA: Diagnosis not present

## 2018-12-29 DIAGNOSIS — R1312 Dysphagia, oropharyngeal phase: Secondary | ICD-10-CM | POA: Diagnosis not present

## 2018-12-29 LAB — COMPREHENSIVE METABOLIC PANEL
ALT: 11 U/L (ref 0–44)
AST: 11 U/L — ABNORMAL LOW (ref 15–41)
Albumin: 2.9 g/dL — ABNORMAL LOW (ref 3.5–5.0)
Alkaline Phosphatase: 34 U/L — ABNORMAL LOW (ref 38–126)
Anion gap: 6 (ref 5–15)
BUN: 61 mg/dL — ABNORMAL HIGH (ref 8–23)
CO2: 26 mmol/L (ref 22–32)
Calcium: 8.4 mg/dL — ABNORMAL LOW (ref 8.9–10.3)
Chloride: 115 mmol/L — ABNORMAL HIGH (ref 98–111)
Creatinine, Ser: 2.49 mg/dL — ABNORMAL HIGH (ref 0.44–1.00)
GFR calc Af Amer: 21 mL/min — ABNORMAL LOW (ref >60)
GFR calc non Af Amer: 18 mL/min — ABNORMAL LOW (ref >60)
Glucose, Bld: 226 mg/dL — ABNORMAL HIGH (ref 70–99)
Potassium: 5.7 mmol/L — ABNORMAL HIGH (ref 3.5–5.1)
Sodium: 147 mmol/L — ABNORMAL HIGH (ref 135–145)
Total Bilirubin: 0.3 mg/dL (ref 0.3–1.2)
Total Protein: 6.6 g/dL (ref 6.5–8.1)

## 2018-12-29 LAB — CBC
HCT: 43.3 % (ref 36.0–46.0)
Hemoglobin: 12.6 g/dL (ref 12.0–15.0)
MCH: 32.7 pg (ref 26.0–34.0)
MCHC: 29.1 g/dL — ABNORMAL LOW (ref 30.0–36.0)
MCV: 112.5 fL — ABNORMAL HIGH (ref 80.0–100.0)
Platelets: 364 10*3/uL (ref 150–400)
RBC: 3.85 MIL/uL — ABNORMAL LOW (ref 3.87–5.11)
RDW: 16.1 % — ABNORMAL HIGH (ref 11.5–15.5)
WBC: 14.8 10*3/uL — ABNORMAL HIGH (ref 4.0–10.5)
nRBC: 0.3 % — ABNORMAL HIGH (ref 0.0–0.2)

## 2018-12-29 LAB — BLOOD GAS, VENOUS
Acid-base deficit: 6.5 mmol/L — ABNORMAL HIGH (ref 0.0–2.0)
Bicarbonate: 25.5 mmol/L (ref 20.0–28.0)
O2 Saturation: 82.9 %
Patient temperature: 98.6
pCO2, Ven: 88.2 mmHg (ref 44.0–60.0)
pH, Ven: 7.088 — CL (ref 7.250–7.430)
pO2, Ven: 53 mmHg — ABNORMAL HIGH (ref 32.0–45.0)

## 2018-12-29 LAB — AMMONIA: Ammonia: 23 umol/L (ref 9–35)

## 2018-12-29 LAB — BLOOD GAS, ARTERIAL
Acid-base deficit: 7.2 mmol/L — ABNORMAL HIGH (ref 0.0–2.0)
Bicarbonate: 26 mmol/L (ref 20.0–28.0)
O2 Saturation: 96.4 %
Patient temperature: 98.6
pCO2 arterial: 101 mmHg (ref 32.0–48.0)
pH, Arterial: 7.04 — CL (ref 7.350–7.450)
pO2, Arterial: 99.2 mmHg (ref 83.0–108.0)

## 2018-12-29 LAB — URINALYSIS, ROUTINE W REFLEX MICROSCOPIC
Bilirubin Urine: NEGATIVE
Glucose, UA: NEGATIVE mg/dL
Ketones, ur: 5 mg/dL — AB
Nitrite: POSITIVE — AB
Protein, ur: 100 mg/dL — AB
RBC / HPF: >50 RBC/hpf — ABNORMAL HIGH (ref 0–5)
Specific Gravity, Urine: 1.02 (ref 1.005–1.030)
WBC, UA: >50 WBC/hpf — ABNORMAL HIGH (ref 0–5)
pH: 5 (ref 5.0–8.0)

## 2018-12-29 LAB — PROTIME-INR
INR: 2.8 — ABNORMAL HIGH (ref 0.8–1.2)
Prothrombin Time: 29.3 seconds — ABNORMAL HIGH (ref 11.4–15.2)

## 2018-12-29 LAB — LACTIC ACID, PLASMA: Lactic Acid, Venous: 1 mmol/L (ref 0.5–1.9)

## 2018-12-29 LAB — LIPASE, BLOOD: Lipase: 35 U/L (ref 11–51)

## 2018-12-29 LAB — SARS CORONAVIRUS 2 BY RT PCR (HOSPITAL ORDER, PERFORMED IN ~~LOC~~ HOSPITAL LAB): SARS Coronavirus 2: NEGATIVE

## 2018-12-29 MED ORDER — LACTATED RINGERS IV BOLUS
1000.0000 mL | Freq: Once | INTRAVENOUS | Status: AC
  Administered 2018-12-29: 1000 mL via INTRAVENOUS

## 2018-12-29 MED ORDER — METRONIDAZOLE IN NACL 5-0.79 MG/ML-% IV SOLN
500.0000 mg | Freq: Once | INTRAVENOUS | Status: AC
  Administered 2018-12-29: 15:00:00 500 mg via INTRAVENOUS
  Filled 2018-12-29: qty 100

## 2018-12-29 MED ORDER — ONDANSETRON HCL 4 MG PO TABS: 4.0000 mg | ORAL_TABLET | Freq: Four times a day (QID) | ORAL | Status: DC | PRN

## 2018-12-29 MED ORDER — ACETAMINOPHEN 650 MG RE SUPP: 650.0000 mg | Freq: Four times a day (QID) | RECTAL | Status: DC | PRN

## 2018-12-29 MED ORDER — SODIUM CHLORIDE 0.9 % IV BOLUS
1000.0000 mL | Freq: Once | INTRAVENOUS | Status: AC
  Administered 2018-12-29: 1000 mL via INTRAVENOUS

## 2018-12-29 MED ORDER — SODIUM CHLORIDE 0.9 % IV SOLN
2.0000 g | Freq: Once | INTRAVENOUS | Status: AC
  Administered 2018-12-29: 2 g via INTRAVENOUS
  Filled 2018-12-29: qty 2

## 2018-12-29 MED ORDER — SODIUM CHLORIDE 0.9 % IV BOLUS: 1000.0000 mL | Freq: Once | INTRAVENOUS | Status: DC

## 2018-12-29 MED ORDER — ACETAMINOPHEN 325 MG PO TABS: 650.0000 mg | ORAL_TABLET | Freq: Four times a day (QID) | ORAL | Status: DC | PRN

## 2018-12-29 MED ORDER — LORAZEPAM 2 MG/ML IJ SOLN: 1.0000 mg | INTRAMUSCULAR | Status: DC | PRN

## 2018-12-29 MED ORDER — MORPHINE SULFATE (PF) 2 MG/ML IV SOLN: 1.0000 mg | INTRAVENOUS | Status: DC | PRN

## 2018-12-29 MED ORDER — ONDANSETRON HCL 4 MG/2ML IJ SOLN: 4.0000 mg | Freq: Four times a day (QID) | INTRAMUSCULAR | Status: DC | PRN

## 2018-12-29 NOTE — ED Notes (Signed)
Unable to obtain second set of blood cultures. Patient is a difficult IV start and difficult blood draw.

## 2018-12-29 NOTE — ED Notes (Signed)
EKG handed to [facility] MD.

## 2018-12-29 NOTE — ED Notes (Addendum)
CRITICAL VALUE STICKER  CRITICAL VALUE: pH: 7.04; PCO2: 101  DATE & TIME NOTIFIED: 12/08/2018 1531  MD NOTIFIED: Tomi Bamberger MD  TIME OF NOTIFICATION: 1532  RESPONSE: Bipap

## 2018-12-29 NOTE — ED Notes (Signed)
Date and time results received: 01/05/2019 1:59 PM  (use smartphrase ".now" to insert current time)  Test: VBG Critical Value: pH 7.088                        PCO2 88.2  Name of Provider Notified: Ngozi, RN  Orders Received? Or Actions Taken?: Actions Taken: Notified EDP Tomi Bamberger

## 2018-12-29 NOTE — ED Triage Notes (Signed)
Per EMS, patient presents from [facility] with c/o decreased LOC and decreased PO intake for the past week. Patient was evaluated here four days ago for same symptoms and has not improved. Patient has indwelling catheter with blood in line. Unable to arouse patient at this time. Patient does not participate in conversation. History of stroke, afib, pacemaker, 2L [place] o2 at baseline

## 2018-12-29 NOTE — Progress Notes (Signed)
Spoke with ED regarding concerns about placing pt on bipap. MD wanted pt placed on bipap temporally since pt is DNI to try and reverse respiratory failure.

## 2018-12-29 NOTE — ED Provider Notes (Signed)
Received signout from Dr. Tomi Bamberger.  Briefly 78 yo female who is critically ill, hyperkalemic, renal failure, severely acidotic, minimally responsive.   Patient was admitted to ICU under Dr. Lamonte Sakai, whose staff is coming to assess patient.  Patient is being started on bipap now as husband does NOT want her intubated.  Husband does want limited CPR (1-2 rounds of defibrillation), but no extended code.  Critical care team is primarily managing patient now.  I will be available for any urgent interventions if necessary.  Clinical Course as of Dec 29 1112  Fri Dec 29, 2018  1257 Patient is extremely altered.  According to the medical records it appears the patient was speaking and eating prior to discharge.  Patient is minimally responsive now.  Vital signs are stable.  We will proceed with ED evaluation   [JK]  1402 Venous blood gas shows a profound acidemia.  PCO2 is elevated but this is venous and I am more concerned about a metabolic acidosis/mixed picture   [JK]  1435 Electrolyte panel shows acute kidney injury with significant increase in her BUN and creatinine.   [JK]  6237 VItals are stable at the bedside.  Pt remains obtunded.  Nursing facility shows pt as DNR.   WIll consult with family, determine goals of care.     [JK]  [place] pt's husband, [name], left message on home phone number and cell phone number.  No response.   [JK]  1501 D/w Dr Lamonte Sakai.  Will hold off on intubation at this time.  Will come to evaluate pt in the ED   [JK]  1519 Discussed findings with husband.  Pt has a living will.  Pt does not want to be intubated.  Ok with limited CPR, "shock once or twice"       [JK]  1533 Notified about the patient's arterial blood gas.  She has a respiratory acidosis.  We can try bipap as husband indicates she would not want to be intubated.  May not be effective considering her profound acidemia   [JK]  1548 Dr Lamonte Sakai evaluated pt.  Recommends medical admission.   [JK]  1611 Update,  per dr byrum's discussion with patient's husband, she is now full comfort care, DNR/DNI, no CPR.   [MT]    Clinical Course User Index [JK] Karen Rank, MD [MT] Karen Masker Carola Rhine, MD      Wyvonnia Dusky, MD 12/30/18 [phone]

## 2018-12-29 NOTE — Consult Note (Signed)
NAME:  [name], MRN:  [id number], DOB:  [birth year], LOS: 0 ADMISSION DATE:  12/14/2018, CONSULTATION DATE:  12/13/2018 REFERRING MD:  Dr. Tomi Bamberger, CHIEF COMPLAINT:  AMS    Brief History   78 y/o F admitted from SNF with reports of AMS.  Found to have hypercarbic respiratory failure (7.0 / 88).    History of present illness   78 y/o F who presented to [facility] on 10/23 from [facility] with reports of AMS.    The patient was admitted from 10/15-10/21 with diverticulitis of a giant diverticulum.  She was treated with IV antibiotics and documented to have improved to the point of eating at time of discharge. She was discharged to a SNF due to physical deconditioning.    She returns to the ER on 10/23 with reports of altered mental status from SNF staff.  Initial work up included a CT of the head which was negative. ABG 7.088 / 88.2 / 53 / 25.5.  Labs - Na 147, K 5.7, cl 115, glucose 226, BUN 61 / sr cr 2.49 (up from baseline ~ 1), lactate 1, WBC 14.8, Hgb 12.6, MCV 112.5 and platelets 364.  UA notable for large Hgb, ketones 5, trace leukocytes, 100 protein, WBC > 50.  CXR demonstrated bilateral pleural effusions R>L, increased vascular congestion with interstitial opacities.   PCCM called for evaluation.   Past Medical History  CVA HTN CAD  Chronic systolic CHF - s/p AICD, LVEF 35% Severe aortic stenosis Mitral Regurgitation - s/p MV repair 2008 AF - s/p MAZE 2008 Thoracic Aortic Aneurysm - s/p graft 2008 GERD Diverticulitis  DM  Anemia  Carcinoma in situ of [facility]   10/23 Admit   Consults:    Procedures:    Significant Diagnostic Tests:  CT Head 10/23 >> no acute intracranial pathology, small-vessel white matter disease   Micro Data:  BCx2 10/23 >>  COVID 10/23 >>   Antimicrobials:  Flagyl 10/23 >>  Cefepime 10/23 >>    Interim history/subjective:  ER staff report the patient to be unresponsive. SBP in 89.   Objective   Blood pressure  (!) 101/39, pulse 69, temperature 98.3 F (36.8 C), temperature source Oral, resp. rate (!) 23, height 5\' 6"  (1.676 m), weight 61.4 kg, SpO2 91 %.        Intake/Output Summary (Last 24 hours) at 12/16/2018 1514 Last data filed at 12/07/2018 1441 Gross per 24 hour  Intake 1000 ml  Output -  Net 1000 ml   Filed Weights   12/17/2018 1140  Weight: 61.4 kg    Examination: General: frail elderly female lying in bed   HEENT: MM pink/dry, dried secretions in posterior pharynx, pupils 20mm reactive  Neuro: obtunded, no response to voice / physical stimulation CV: s1s2 regular, 60's on monitor, no m/r/g PULM:  Shallow / non-labored, lungs bilaterally diminished  GI / GU: soft, bsx4 active, foley in place  Extremities: warm/dry, trace generalized edema  Skin: no rashes or lesions  Resolved Hospital Problem list      Assessment & Plan:   Acute Metabolic Encephalopathy  Multifactorial in the setting of hypercarbia, hypoxemia, possible infection (UTI) -supportive care   Acute Hypoxic / Hypercarbic Respiratory Failure  -O2 as needed for comfort  -PRN morphine for evidence of discomfort, defer dosing to TRH  -await COVID testing  -not a candidate for BIPAP given mental status  Severe Sepsis   Possible urinary source, recent diverticulitis  -abx given  in ER, hold further abx given decline.     -supportive care   Acute Kidney Injury  Baseline sr cr ~ 1 -no further labs   Macrocytic Anemia  -no further labs    Best practice:  Diet: NPO  DVT prophylaxis: n/a GI prophylaxis: n/a  Glucose control: n/a  Mobility: Bed rest  Code Status: DNR  Family Communication: Husband updated per Dr. Lamonte Sakai.  Patient DNR with comfort measures.  Disposition: Per TRH.   Labs   CBC: Recent Labs  Lab 12/23/18 0350 12/24/18 0402 12/25/18 0320 01/06/2019 1346  WBC 8.5 8.2 11.0* 14.8*  NEUTROABS 7.4 6.9 9.8*  --   HGB 10.5* 10.8* 10.9* 12.6  HCT 34.2* 35.3* 36.6 43.3  MCV 102.4* 104.1*  106.4* 112.5*  PLT 276 260 247 950    Basic Metabolic Panel: Recent Labs  Lab 12/23/18 0350 12/24/18 0402 12/25/18 0320 12/15/2018 1346  NA 142 143 145 147*  K 3.9 3.9 3.9 5.7*  CL 106 112* 117* 115*  CO2 24 21* 20* 26  GLUCOSE 99 96 118* 226*  BUN 23 22 21  61*  CREATININE 1.28* 1.24* 1.08* 2.49*  CALCIUM 8.1* 7.6* 7.2* 8.4*   GFR: Estimated Creatinine Clearance: 17.7 mL/min (A) (by C-G formula based on SCr of 2.49 mg/dL (H)). Recent Labs  Lab 12/23/18 0350 12/24/18 0402 12/25/18 0320 12/28/2018 1346 12/11/2018 1347  WBC 8.5 8.2 11.0* 14.8*  --   LATICACIDVEN  --   --   --   --  1.0    Liver Function Tests: Recent Labs  Lab 01/06/2019 1346  AST 11*  ALT 11  ALKPHOS 34*  BILITOT 0.3  PROT 6.6  ALBUMIN 2.9*   Recent Labs  Lab 12/25/2018 1346  LIPASE 35   Recent Labs  Lab 12/15/2018 1348  AMMONIA 23    ABG    Component Value Date/Time   PHART 7.425 05/22/2012 0347   PCO2ART 30.5 (L) 05/22/2012 0347   PO2ART 102.0 (H) 05/22/2012 0347   HCO3 25.5 12/08/2018 1345   TCO2 28 12/11/2014 0722   ACIDBASEDEF 6.5 (H) 12/27/2018 1345   O2SAT 82.9 12/19/2018 1345     Coagulation Profile: Recent Labs  Lab 12/23/18 0350 12/24/18 0402 12/25/18 0320 12/26/18 0411 12/27/18 0429  INR 3.6* 3.6* 4.1* 3.1* 2.2*    Cardiac Enzymes: No results for input(s): CKTOTAL, CKMB, CKMBINDEX, TROPONINI in the last 168 hours.  HbA1C: Hgb A1c MFr Bld  Date/Time Value Ref Range Status  12/21/2018 11:37 AM 5.9 (H) 4.8 - 5.6 % Final    Comment:    (NOTE) Pre diabetes:          5.7%-6.4% Diabetes:              >6.4% Glycemic control for   <7.0% adults with diabetes   10/18/2012 02:10 PM 6.0 4.6 - 6.5 % Final    Comment:    Glycemic Control Guidelines for People with Diabetes:Non Diabetic:  <6%Goal of Therapy: <7%Additional Action Suggested:  >8%     CBG: Recent Labs  Lab 12/26/18 1138 12/26/18 1646 12/26/18 2147 12/27/18 0731 12/27/18 1131  GLUCAP 211* 194* 195*  189* 212*    Review of Systems:   Unable to complete due to AMS.   Past Medical History  She,  has a past medical history of AICD (automatic cardioverter/defibrillator) present, Anemia, Arthritis, Atrial fibrillation ([place]), Carcinoma in situ of vulva, Chronic systolic CHF (congestive heart failure) ([place]), Coronary artery disease, CVA (cerebral vascular accident) ([place]), Diabetes mellitus,  Diverticulitis, Dysrhythmia, Fibroid, Fracture of lumbar spine ([place]), GERD (gastroesophageal reflux disease), Hypertension, Ischemic cardiomyopathy, Kidney stones, Mitral regurgitation, Osteoporosis, Ovarian cyst, left (2008-2009), Severe aortic stenosis, Stroke [facility]), Superficial venous thrombosis of upper extremity, Thoracic aortic aneurysm ([place]), and Ventricular fibrillation ([place]).   Surgical History    Past Surgical History:  Procedure Laterality Date  . aneurysm and left sided maze  2008  . AORTIC ROOT REPLACEMENT  2008   homograft  . CARDIAC DEFIBRILLATOR PLACEMENT  06/08/2012   [employer] [address] DR  implanted by Dr Rayann Heman following a cardiac arrest  . CATARACT EXTRACTION    . CORONARY ARTERY BYPASS GRAFT  4/10   LIMA to LAD, SVG to OM  . EYE SURGERY    . FOOT SURGERY Left    removed neuroma  . IMPLANTABLE CARDIOVERTER DEFIBRILLATOR IMPLANT N/A 06/08/2012   Procedure: IMPLANTABLE CARDIOVERTER DEFIBRILLATOR IMPLANT;  Surgeon: Thompson Grayer, MD;  Location: [facility] CATH LAB;  Service: Cardiovascular;  Laterality: N/A;  . KYPHOPLASTY N/A 12/11/2014   Procedure: KYPHOPLASTY T-12;  Surgeon: Karie Chimera, MD;  Location: [place] [facility] NEURO ORS;  Service: Neurosurgery;  Laterality: N/A;  KYPHOPLASTY T-12  . MITRAL VALVE REPLACEMENT  2008   due to severe MR  . Resection and grafting of ascending thoracic aortic    . RIGHT HEART CATHETERIZATION N/A 04/03/2012   Procedure: RIGHT HEART CATH;  Surgeon: [name], MD;  Location: [facility] CATH LAB;  Service: Cardiovascular;  Laterality: N/A;  . WIDE EXCISION OF VULVA      CA INSITU     Social History   reports that she quit smoking about 35 years ago. Her smoking use included cigarettes. She has a 5.00 pack-year smoking history. She has never used smokeless tobacco. She reports that she does not drink alcohol or use drugs.   Family History   Her family history includes Breast cancer in her maternal aunt; Colon cancer in her paternal grandmother; Diabetes in her maternal uncle and paternal uncle; Emphysema in her father; Hypertension in her mother.   Allergies Allergies  Allergen Reactions  . Metformin And Related     Renal issues  . Tikosyn [Dofetilide]     Passed out  . Caffeine Other (See Comments)    Affects heart     Home Medications  Prior to Admission medications   Medication Sig Start Date End Date Taking? Authorizing Provider  amoxicillin-clavulanate (AUGMENTIN) 875-125 MG tablet Take 1 tablet by mouth every 12 (twelve) hours for 7 days. 12/27/18 01/03/19  Shelly Coss, MD  carvedilol (COREG) 12.5 MG tablet TAKE 1 TABLET BY MOUTH TWICE A DAY WITH A MEAL Patient taking differently: Take 12.5 mg by mouth 2 (two) times daily with a meal.  11/20/18   Lelon Perla, MD  Cyanocobalamin (VITAMIN B-12) 2500 MCG SUBL Place 1 tablet under the tongue every Monday, Wednesday, and Friday.     [provider]  ergocalciferol (VITAMIN D2) 50000 UNITS capsule Take 50,000 Units by mouth once a week. Monday    [provider]  furosemide (LASIX) 20 MG tablet TAKE ONE TABLET ONCE DAILY AND TAKE 1 EXTRA AS NEEDED Patient taking differently: Take 20 mg by mouth daily.  07/07/18   Lelon Perla, MD  losartan (COZAAR) 25 MG tablet Take 25 mg by mouth 2 (two) times daily. 11/16/18   [provider]  NON FORMULARY ([employer]) Take 500 mg of elemental calcium by mouth at bedtime    [provider]  PACERONE 200 MG  tablet TAKE 1/2 TABLET BY MOUTH DAILY. Patient taking differently: Take 100 mg by mouth daily.  08/10/18    Allred, Jeneen Rinks, MD  warfarin (COUMADIN) 3 MG tablet TAKE 1/2 TO 1 TABLET BY MOUTH AS DIRECTED BY COUMADIN CLINIC Patient taking differently: Take 1-1.5 mg by mouth See admin instructions. Takes 1.5mg  on Monday, Tuesday, Wednesday, Friday & Saturday; Takes 1mg  on Thursday and Sunday. 11/06/18   Lelon Perla, MD     Critical care time:     Noe Gens, NP-C [facility] 12/17/2018, 3:14 PM

## 2018-12-29 NOTE — ED Provider Notes (Addendum)
[place] [facility] [facility] DEPT Provider Note   CSN: [id number] Arrival date & time: 12/30/2018  1131     History   Chief Complaint Chief Complaint  Patient presents with   Altered Mental Status    HPI [name] is a 78 y.o. female.     HPI Patient is a resident of a nursing facility.  She was sent to the ED for decline in her mental status and decreased p.o. intake.  Was recently in the emergency room October 15 for progressive weakness.  Patient was admitted to the hospital.  Patient was discharged on October 21.  Patient was treated for diverticulitis with antibiotics.  Patient was apparently discharged tolerating a diet with out complaints of abdominal pain.  She was transferred to a skilled nursing facility.  On exam in the ED, the patient is not able to communicate with me.  She just moans in response to my exam. Past Medical History:  Diagnosis Date   AICD (automatic cardioverter/defibrillator) present    2014   Anemia    Arthritis    OSTEO   Atrial fibrillation ([place])    A.fib/flutter: s/p MAZE 2008, DCCV 2011, on coumadin; previously on flecainide, but dc'd due to worsening EF. Tikosyn discontinued 06/2012 after cardiac arrest.   Carcinoma in situ of vulva    Chronic systolic CHF (congestive heart failure) (HCC)    EF 35%   Coronary artery disease    Occluded LM 2/2 previous aortic root surgery; s/p 2v CABG 2010 LIMA to LAD, SVG to OM; Cath 03/11/12 patent SVG to OM, atretic LIMA to LAD, patent RCA, occluded native LM, EF 35%    CVA (cerebral vascular accident) ([place])    2008 - felt due to oscillating calcium on aortic valve   Diabetes mellitus    Diverticulitis    Fibroid    Fracture of lumbar spine ([place])    GERD (gastroesophageal reflux disease)    Hypertension    Ischemic cardiomyopathy    EF 35%   Kidney stones    Mitral regurgitation    s/p mitral valve repair 2008   Osteoporosis    Ovarian cyst, left 2008-2009   Severe  aortic stenosis    s/p homograft aortic root replacement 2008   Stroke [facility])    Superficial venous thrombosis of upper extremity    06/2012 - LUE   Thoracic aortic aneurysm [facility])    s/p resection and grafting 2008   Ventricular fibrillation ([place])    VF arrest/VT/torsades 06/2012 with prolonged hosp with VDRF, cardiogen shock, asp PNA, encephalopathy, anemia, shock liver, hypernatremia - s/p ICD implantation 06/08/12.    Patient Active Problem List   Diagnosis Date Noted   Diverticulitis 12/21/2018   Sepsis ([place]) 12/21/2018   Pain due to onychomycosis of toenails of both feet 10/03/2018   Coagulation disorder ([place]) 10/03/2018   Diabetes mellitus without complication ([place]) 93/26/7124   ICD (implantable cardioverter-defibrillator) in place 11/28/2012   Kidney disease, chronic, stage III (GFR 30-59 ml/min) 10/19/2012   Valvular heart disease-bicuspid aortic valve s/p Mechanical replacement//MV repair    Back pain, thoracic 02/21/2012   Ischemic cardiomyopathy 01/26/2012   Carcinoma in situ of vulva    Fibroid    Ovarian cyst, left    Osteoporosis, idiopathic    Type II or unspecified type diabetes mellitus with renal manifestations, uncontrolled(250.42)    GERD (gastroesophageal reflux disease)    Encounter for long-term (current) use of anticoagulants 06/08/2010   Mitral valve disease 04/16/2010  VITAMIN D DEFICIENCY 11/05/2008   VITAMIN B12 DEFICIENCY 10/22/2008   Essential hypertension, benign 07/30/2008   Celiac disease 08/09/2007   HYPERLIPIDEMIA 07/21/2007   Atrial fibrillation ([place]) 07/21/2007   CHOLELITHIASIS 07/21/2007   UNSPECIFIED IRON DEFICIENCY ANEMIA 06/06/2007    Past Surgical History:  Procedure Laterality Date   aneurysm and left sided maze  2008   AORTIC ROOT REPLACEMENT  2008   homograft   CARDIAC DEFIBRILLATOR PLACEMENT  06/08/2012   [employer] [address]  implanted by Dr Rayann Heman following a cardiac arrest    CATARACT EXTRACTION     CORONARY ARTERY BYPASS GRAFT  4/10   LIMA to LAD, SVG to OM   EYE SURGERY     FOOT SURGERY Left    removed neuroma   IMPLANTABLE CARDIOVERTER DEFIBRILLATOR IMPLANT N/A 06/08/2012   Procedure: IMPLANTABLE CARDIOVERTER DEFIBRILLATOR IMPLANT;  Surgeon: Thompson Grayer, MD;  Location: [facility] CATH LAB;  Service: Cardiovascular;  Laterality: N/A;   KYPHOPLASTY N/A 12/11/2014   Procedure: KYPHOPLASTY T-12;  Surgeon: Karie Chimera, MD;  Location: [place] NEURO ORS;  Service: Neurosurgery;  Laterality: N/A;  KYPHOPLASTY T-12   MITRAL VALVE REPLACEMENT  2008   due to severe MR   Resection and grafting of ascending thoracic aortic     RIGHT HEART CATHETERIZATION N/A 04/03/2012   Procedure: RIGHT HEART CATH;  Surgeon: [name], MD;  Location: [facility] CATH LAB;  Service: Cardiovascular;  Laterality: N/A;   WIDE EXCISION OF VULVA     CA INSITU     OB History    Gravida  0   Para      Term      Preterm      AB      Living        SAB      TAB      Ectopic      Multiple      Live Births               Home Medications    Prior to Admission medications   Medication Sig Start Date End Date Taking? Authorizing Provider  amoxicillin-clavulanate (AUGMENTIN) 875-125 MG tablet Take 1 tablet by mouth every 12 (twelve) hours for 7 days. 12/27/18 01/03/19  Shelly Coss, MD  carvedilol (COREG) 12.5 MG tablet TAKE 1 TABLET BY MOUTH TWICE A DAY WITH A MEAL Patient taking differently: Take 12.5 mg by mouth 2 (two) times daily with a meal.  11/20/18   Lelon Perla, MD  Cyanocobalamin (VITAMIN B-12) 2500 MCG SUBL Place 1 tablet under the tongue every Monday, Wednesday, and Friday.     [provider]  ergocalciferol (VITAMIN D2) 50000 UNITS capsule Take 50,000 Units by mouth once a week. Monday    [provider]  furosemide (LASIX) 20 MG tablet TAKE ONE TABLET ONCE DAILY AND TAKE 1 EXTRA AS NEEDED Patient taking differently: Take 20 mg by mouth  daily.  07/07/18   Lelon Perla, MD  losartan (COZAAR) 25 MG tablet Take 25 mg by mouth 2 (two) times daily. 11/16/18   [provider]  NON FORMULARY ([employer]) Take 500 mg of elemental calcium by mouth at bedtime    [provider]  PACERONE 200 MG tablet TAKE 1/2 TABLET BY MOUTH DAILY. Patient taking differently: Take 100 mg by mouth daily.  08/10/18   Allred, Jeneen Rinks, MD  warfarin (COUMADIN) 3 MG tablet TAKE 1/2 TO 1 TABLET BY MOUTH AS DIRECTED BY COUMADIN CLINIC Patient taking  differently: Take 1-1.5 mg by mouth See admin instructions. Takes 1.5mg  on Monday, Tuesday, Wednesday, Friday & Saturday; Takes 1mg  on Thursday and Sunday. 11/06/18   Lelon Perla, MD    Family History Family History  Problem Relation Age of Onset   Hypertension Mother    Breast cancer Maternal Aunt        age 3's   Colon cancer Paternal Grandmother    Diabetes Maternal Uncle    Diabetes Paternal Uncle    Emphysema Father     Social History Social History   Tobacco Use   Smoking status: Former Smoker    Packs/day: 0.25    Years: 20.00    Pack years: 5.00    Types: Cigarettes    Quit date: 03/09/1983    Years since quitting: 35.8   Smokeless tobacco: Never Used  Substance Use Topics   Alcohol use: No   Drug use: No     Allergies   Metformin and related, Tikosyn [dofetilide], and Caffeine   Review of Systems Review of Systems  All other systems reviewed and are negative.    Physical Exam Updated Vital Signs BP (!) 101/39    Pulse 69    Temp 98.3 F (36.8 C) (Oral)    Resp (!) 23    Ht 1.676 m (5\' 6" )    Wt 61.4 kg    SpO2 91%    BMI 21.85 kg/m   Physical Exam Vitals signs and nursing note reviewed.  Constitutional:      Appearance: She is well-developed. She is ill-appearing.  HENT:     Head: Normocephalic and atraumatic.     Right Ear: External ear normal.     Left Ear: External ear normal.     Mouth/Throat:     Comments: Drooling Eyes:      General: No scleral icterus.       Right eye: No discharge.        Left eye: No discharge.     Conjunctiva/sclera: Conjunctivae normal.  Neck:     Musculoskeletal: Neck supple.     Trachea: No tracheal deviation.  Cardiovascular:     Rate and Rhythm: Normal rate and regular rhythm.  Pulmonary:     Effort: Pulmonary effort is normal. No respiratory distress.     Breath sounds: Normal breath sounds. No stridor. No wheezing or rales.  Abdominal:     General: Bowel sounds are normal. There is no distension.     Palpations: Abdomen is soft.     Tenderness: There is abdominal tenderness. There is no guarding or rebound.     Comments: Groans when abdomen is palpated  Musculoskeletal:        General: Swelling present.     Comments: Edema of the left upper extremity  Skin:    General: Skin is warm and dry.     Findings: No rash.  Neurological:     Cranial Nerves: No cranial nerve deficit (unable to assess for facial droop, does not speak, moans).     Motor: No abnormal muscle tone or seizure activity.     Comments: Patient moans in response to physical exam but otherwise does not answer questions, she does not follow commands      ED Treatments / Results  Labs (all labs ordered are listed, but only abnormal results are displayed) Labs Reviewed  CBC - Abnormal; Notable for the following components:      Result Value   WBC 14.8 (*)  RBC 3.85 (*)    MCV 112.5 (*)    MCHC 29.1 (*)    RDW 16.1 (*)    nRBC 0.3 (*)    All other components within normal limits  COMPREHENSIVE METABOLIC PANEL - Abnormal; Notable for the following components:   Sodium 147 (*)    Potassium 5.7 (*)    Chloride 115 (*)    Glucose, Bld 226 (*)    BUN 61 (*)    Creatinine, Ser 2.49 (*)    Calcium 8.4 (*)    Albumin 2.9 (*)    AST 11 (*)    Alkaline Phosphatase 34 (*)    GFR calc non Af Amer 18 (*)    GFR calc Af Amer 21 (*)    All other components within normal limits  URINALYSIS, ROUTINE W REFLEX  MICROSCOPIC - Abnormal; Notable for the following components:   Color, Urine RED (*)    APPearance CLOUDY (*)    Hgb urine dipstick LARGE (*)    Ketones, ur 5 (*)    Protein, ur 100 (*)    Nitrite POSITIVE (*)    Leukocytes,Ua TRACE (*)    RBC / HPF >50 (*)    WBC, UA >50 (*)    Bacteria, UA RARE (*)    All other components within normal limits  BLOOD GAS, VENOUS - Abnormal; Notable for the following components:   pH, Ven 7.088 (*)    pCO2, Ven 88.2 (*)    pO2, Ven 53.0 (*)    Acid-base deficit 6.5 (*)    All other components within normal limits  BLOOD GAS, ARTERIAL - Abnormal; Notable for the following components:   pH, Arterial 7.040 (*)    pCO2 arterial 101 (*)    Acid-base deficit 7.2 (*)    All other components within normal limits  PROTIME-INR - Abnormal; Notable for the following components:   Prothrombin Time 29.3 (*)    INR 2.8 (*)    All other components within normal limits  CULTURE, BLOOD (ROUTINE X 2)  CULTURE, BLOOD (ROUTINE X 2)  SARS CORONAVIRUS 2 (TAT 6-24 HRS)  SARS CORONAVIRUS 2 (TAT 6-24 HRS)  LIPASE, BLOOD  LACTIC ACID, PLASMA  AMMONIA    EKG EKG Interpretation  Date/Time:  Friday December 29 2018 15:33:05 EDT Ventricular Rate:  66 PR Interval:    QRS Duration: 131 QT Interval:  646 QTC Calculation: 678 R Axis:   -39 Text Interpretation:  sinus rhythm Nonspecific IVCD with LAD Anteroseptal infarct, old Nonspecific T abnormalities, lateral leads t wave inversion on prio ECG resolved Confirmed by Dorie Rank [phone]) on 12/11/2018 3:36:33 PM   Radiology Ct Head Wo Contrast  Result Date: 12/26/2018 CLINICAL DATA:  Altered mental status EXAM: CT HEAD WITHOUT CONTRAST TECHNIQUE: Contiguous axial images were obtained from the base of the skull through the vertex without intravenous contrast. COMPARISON:  05/18/2012 FINDINGS: Brain: No evidence of acute infarction, hemorrhage, hydrocephalus, extra-axial collection or mass lesion/mass effect.  Periventricular white matter hypodensity. Vascular: No hyperdense vessel or unexpected calcification. Skull: Normal. Negative for fracture or focal lesion. Sinuses/Orbits: No acute finding. Other: None. IMPRESSION: No acute intracranial pathology.  Small-vessel white matter disease. Electronically Signed   By: Eddie Candle M.D.   On: 01/03/2019 14:31   Dg Chest Portable 1 View  Result Date: 12/20/2018 CLINICAL DATA:  Altered level of consciousness. EXAM: PORTABLE CHEST 1 VIEW COMPARISON:  12/21/2018 FINDINGS: Cardiomegaly, cardiac valve replacement and LEFT-sided pacemaker/AICD noted. Increasing pulmonary vascular congestion and  interstitial opacities/possible edema noted. New RIGHT pleural effusion, possible LEFT pleural effusion and bilateral LOWER lung opacities/atelectasis noted. No definite pneumothorax. IMPRESSION: 1. Increasing pulmonary vascular congestion and interstitial opacities/possible edema. 2. New RIGHT pleural effusion, possible LEFT pleural effusion and bilateral LOWER lung opacities/atelectasis. Electronically Signed   By: Margarette Canada M.D.   On: 01/04/2019 13:53    Procedures .Critical Care Performed by: Dorie Rank, MD Authorized by: Dorie Rank, MD   Critical care provider statement:    Critical care time (minutes):  45   Critical care was time spent personally by me on the following activities:  Discussions with consultants, evaluation of patient's response to treatment, examination of patient, ordering and performing treatments and interventions, ordering and review of laboratory studies, ordering and review of radiographic studies, pulse oximetry, re-evaluation of patient's condition, obtaining history from patient or surrogate and review of old charts   (including critical care time)  Medications Ordered in ED Medications  metroNIDAZOLE (FLAGYL) IVPB 500 mg (500 mg Intravenous New Bag/Given 12/07/2018 1515)  lactated ringers bolus 1,000 mL (0 mLs Intravenous Stopped  01/03/2019 1441)  ceFEPIme (MAXIPIME) 2 g in sodium chloride 0.9 % 100 mL IVPB (0 g Intravenous Stopped 12/12/2018 1516)  sodium chloride 0.9 % bolus 1,000 mL (1,000 mLs Intravenous New Bag/Given 12/11/2018 1443)     Initial Impression / Assessment and Plan / ED Course  I have reviewed the triage vital signs and the nursing notes.  Pertinent labs & imaging results that were available during my care of the patient were reviewed by me and considered in my medical decision making (see chart for details).  Clinical Course as of Dec 31 1143  Fri Dec 29, 2018  1257 Patient is extremely altered.  According to the medical records it appears the patient was speaking and eating prior to discharge.  Patient is minimally responsive now.  Vital signs are stable.  We will proceed with ED evaluation   [JK]  1402 Venous blood gas shows a profound acidemia.  PCO2 is elevated but this is venous and I am more concerned about a metabolic acidosis/mixed picture   [JK]  1435 Electrolyte panel shows acute kidney injury with significant increase in her BUN and creatinine.   [JK]  7893 VItals are stable at the bedside.  Pt remains obtunded.  Nursing facility shows pt as DNR.   WIll consult with family, determine goals of care.     [JK]  [place] pt's husband, Jestine Bicknell, left message on home phone number and cell phone number.  No response.   [JK]  1501 D/w Dr Lamonte Sakai.  Will hold off on intubation at this time.  Will come to evaluate pt in the ED   [JK]  1519 Discussed findings with husband.  Pt has a living will.  Pt does not want to be intubated.  Ok with limited CPR, "shock once or twice"       [JK]  1533 Notified about the patient's arterial blood gas.  She has a respiratory acidosis.  We can try bipap as husband indicates she would not want to be intubated.  May not be effective considering her profound acidemia   [JK]  1548 Dr Lamonte Sakai evaluated pt.  Recommends medical admission.   [JK]  1611 Update, per dr  byrum's discussion with patient's husband, she is now full comfort care, DNR/DNI, no CPR.   [MT]    Clinical Course User Index [JK] Dorie Rank, MD [MT] Langston Masker Carola Rhine, MD  Patient presented to the emergency room for evaluation of altered mental status.  According to the discharge paperwork from her recent hospitalization the patient had been more alert mentally.  Here in the ED the patient's obtunded and only responds to painful stimuli with groans.  Her laboratory tests show several significant abnormalities.  She has a severe acidosis, this may be mixed metabolic and respiratory, she has acute renal failure and hypernatremia as well as hyper kalemia and hyperchloremia.  Patient does have abdominal tenderness on exam.  She was recently treated for diverticulitis.  I suspect she may be having persistent infection associated with that.  Urinalysis also does show the possibility of urinary tract infection although she does have an indwelling catheter.  Patient had a CT scan back in October 15.  Think she may benefit from repeat imaging but first we will try to get her stabilized.  Patient has been hydrated with IV fluids.  Empiric antibiotics were started.  I have ordered an ABG to further clarify her acidosis and hypercarbia noted on her venous blood gas.  Patient may require intubation because of her mental status and acidemia but currently she is oxygenating well.  I attempted to contact the family members to clarify CODE STATUS and goals of care.  I have been unable to reach the husband.  Plan on admission.  Critical care service has been consulted.  We will continue to monitor closely.  Final Clinical Impressions(s) / ED Diagnoses   Final diagnoses:  Acute renal failure, unspecified acute renal failure type ([place])  Uremia  Metabolic encephalopathy        Dorie Rank, MD 12/18/2018 1550    Dorie Rank, MD 01/01/2019 1146

## 2018-12-29 NOTE — H&P (Signed)
History and Physical        Hospital Admission Note Date: 12/08/2018  Patient name: [name] Medical record number: [id number] Date of birth: 09-Mar-1940 Age: 78 y.o. Gender: female  PCP: Velna Hatchet, MD    Patient coming from: [facility]  I have reviewed all records in the [facility].    Chief Complaint:  *Obtunded  HPI: Patient is a 78 year old female with history of prior CVA, hypertension, CAD, chronic systolic CHF with EF 93%, status post AICD, severe aortic stenosis, mitral regurgitation, atrial fibrillation, thoracic aortic aneurysm, GERD, diabetes mellitus, chronic respiratory failure with 2 L O2 at baseline, who was recently admitted from 10/15-10/21 with acute diverticulitis.  Patient was treated with IV antibiotics and was tolerating diet at the time of discharge.  She was discharged to skilled nursing facility, [facility]. Patient was sent from SNF today for decreased consciousness, decreased p.o. intake, had not been eating anything for the last 2 days. At the time of examination, patient was obtunded, on BiPAP, husband at the bedside.  Appears to be grimacing and somewhat agonal respiratory pattern.  Patient does not respond to voice or pain stimuli.  CT head was negative however blood gas showed profound respiratory acidosis with pH of 7.0, PCO2> 100.  Patient was placed on BiPAP. Patient was evaluated by critical care prior to my examination, Dr. Lamonte Sakai had discussed CODE STATUS with the patient's husband, overall poor prognosis, critically ill with multiorgan dysfunction.  Patient was transitioned over to comfort care approach which was reaffirmed by myself with the patient's husband.  ED work-up/course:  Temp 98.3, respiratory rate 18, pulse 79, BP 103/56 ABG showed pH 7.04, PCO2 101, PO2 99 Sodium 147, potassium 5.7, chloride 115, bicarb 26, BUN  61, creatinine 2.49, creatinine was 1.08 on 10/19 WBCs 14.8, was 11.0 on 10/19 CT head negative for acute intracranial pathology.  Review of Systems: Positives marked in 'bold' Unable to obtain due to patient's mental status, obtunded  Past Medical History: Past Medical History:  Diagnosis Date  . AICD (automatic cardioverter/defibrillator) present    2014  . Anemia   . Arthritis    OSTEO  . Atrial fibrillation [facility])    A.fib/flutter: s/p MAZE 2008, DCCV 2011, on coumadin; previously on flecainide, but dc'd due to worsening EF. Tikosyn discontinued 06/2012 after cardiac arrest.  . Carcinoma in situ of vulva   . Chronic systolic CHF (congestive heart failure) (HCC)    EF 35%  . Coronary artery disease    Occluded LM 2/2 previous aortic root surgery; s/p 2v CABG 2010 LIMA to LAD, SVG to OM; Cath 03/11/12 patent SVG to OM, atretic LIMA to LAD, patent RCA, occluded native LM, EF 35%   . CVA (cerebral vascular accident) ([place])    2008 - felt due to oscillating calcium on aortic valve  . Diabetes mellitus   . Diverticulitis   . Fibroid   . Fracture of lumbar spine ([place])   . GERD (gastroesophageal reflux disease)   . Hypertension   . Ischemic cardiomyopathy    EF 35%  . Kidney stones   . Mitral regurgitation    s/p mitral valve repair 2008  .  Osteoporosis   . Ovarian cyst, left 2008-2009  . Severe aortic stenosis    s/p homograft aortic root replacement 2008  . Stroke ([place])   . Superficial venous thrombosis of upper extremity    06/2012 - LUE  . Thoracic aortic aneurysm [facility])    s/p resection and grafting 2008  . Ventricular fibrillation ([place])    VF arrest/VT/torsades 06/2012 with prolonged hosp with VDRF, cardiogen shock, asp PNA, encephalopathy, anemia, shock liver, hypernatremia - s/p ICD implantation 06/08/12.    Past Surgical History:  Procedure Laterality Date  . aneurysm and left sided maze  2008  . AORTIC ROOT REPLACEMENT  2008   homograft  . CARDIAC DEFIBRILLATOR  PLACEMENT  06/08/2012   [employer] [address] DR  implanted by Dr Rayann Heman following a cardiac arrest  . CATARACT EXTRACTION    . CORONARY ARTERY BYPASS GRAFT  4/10   LIMA to LAD, SVG to OM  . EYE SURGERY    . FOOT SURGERY Left    removed neuroma  . IMPLANTABLE CARDIOVERTER DEFIBRILLATOR IMPLANT N/A 06/08/2012   Procedure: IMPLANTABLE CARDIOVERTER DEFIBRILLATOR IMPLANT;  Surgeon: Thompson Grayer, MD;  Location: [facility] CATH LAB;  Service: Cardiovascular;  Laterality: N/A;  . KYPHOPLASTY N/A 12/11/2014   Procedure: KYPHOPLASTY T-12;  Surgeon: Karie Chimera, MD;  Location: [facility] NEURO ORS;  Service: Neurosurgery;  Laterality: N/A;  KYPHOPLASTY T-12  . MITRAL VALVE REPLACEMENT  2008   due to severe MR  . Resection and grafting of ascending thoracic aortic    . RIGHT HEART CATHETERIZATION N/A 04/03/2012   Procedure: RIGHT HEART CATH;  Surgeon: [name], MD;  Location[facility] CATH LAB;  Service: Cardiovascular;  Laterality: N/A;  . WIDE EXCISION OF VULVA     CA INSITU    Medications: Prior to Admission medications   Medication Sig Start Date End Date Taking? Authorizing Provider  amoxicillin-clavulanate (AUGMENTIN) 875-125 MG tablet Take 1 tablet by mouth every 12 (twelve) hours for 7 days. 12/27/18 01/03/19  Shelly Coss, MD  carvedilol (COREG) 12.5 MG tablet TAKE 1 TABLET BY MOUTH TWICE A DAY WITH A MEAL Patient taking differently: Take 12.5 mg by mouth 2 (two) times daily with a meal.  11/20/18   Lelon Perla, MD  Cyanocobalamin (VITAMIN B-12) 2500 MCG SUBL Place 1 tablet under the tongue every Monday, Wednesday, and Friday.     [provider]  ergocalciferol (VITAMIN D2) 50000 UNITS capsule Take 50,000 Units by mouth once a week. Monday    [provider]  furosemide (LASIX) 20 MG tablet TAKE ONE TABLET ONCE DAILY AND TAKE 1 EXTRA AS NEEDED Patient taking differently: Take 20 mg by mouth daily.  07/07/18   Lelon Perla, MD  losartan (COZAAR) 25 MG tablet Take 25 mg  by mouth 2 (two) times daily. 11/16/18   [provider]  NON FORMULARY ([employer]) Take 500 mg of elemental calcium by mouth at bedtime    [provider]  PACERONE 200 MG tablet TAKE 1/2 TABLET BY MOUTH DAILY. Patient taking differently: Take 100 mg by mouth daily.  08/10/18   Allred, Jeneen Rinks, MD  warfarin (COUMADIN) 3 MG tablet TAKE 1/2 TO 1 TABLET BY MOUTH AS DIRECTED BY COUMADIN CLINIC Patient taking differently: Take 1-1.5 mg by mouth See admin instructions. Takes 1.5mg  on Monday, Tuesday, Wednesday, Friday & Saturday; Takes 1mg  on Thursday and Sunday. 11/06/18   Lelon Perla, MD    Allergies:   Allergies  Allergen Reactions  . Metformin  And Related     Renal issues  . Tikosyn [Dofetilide]     Passed out  . Caffeine Other (See Comments)    Affects heart    Social History: Per chart review,  she quit smoking about 35 years ago. Her smoking use included cigarettes. She has a 5.00 pack-year smoking history. She has never used smokeless tobacco. She reports that she does not drink alcohol or use drugs.  Family History: Family History  Problem Relation Age of Onset  . Hypertension Mother   . Breast cancer Maternal Aunt        age 31's  . Colon cancer Paternal Grandmother   . Diabetes Maternal Uncle   . Diabetes Paternal Uncle   . Emphysema Father     Physical Exam: Blood pressure (!) 93/41, pulse 63, temperature 98.3 F (36.8 C), temperature source Oral, resp. rate 11, height 5\' 6"  (1.676 m), weight 61.4 kg, SpO2 99 %. General: Obtunded, does not respond to voice or painful stimuli, grimacing, agonal breathing Eyes: pink conjunctiva,anicteric sclera, pupils equal and reactive to light HEENT: Atraumatic, normocephalic Neck: On BiPAP CVS: Regular rate and rhythm,  Resp : Decreased at both bases, shallow breathing GI : Soft, grimacing on touch, Foley in place Musculoskeletal: Generalized edema  neuro: Obtunded Psych: Obtunded Skin: no rashes or lesions,  warm and dry   LABS on Admission: I have personally reviewed all the labs and imagings below    Basic Metabolic Panel: Recent Labs  Lab 12/25/18 0320 12/28/2018 1346  NA 145 147*  K 3.9 5.7*  CL 117* 115*  CO2 20* 26  GLUCOSE 118* 226*  BUN 21 61*  CREATININE 1.08* 2.49*  CALCIUM 7.2* 8.4*   Liver Function Tests: Recent Labs  Lab 12/20/2018 1346  AST 11*  ALT 11  ALKPHOS 34*  BILITOT 0.3  PROT 6.6  ALBUMIN 2.9*   Recent Labs  Lab 12/15/2018 1346  LIPASE 35   Recent Labs  Lab 01/03/2019 1348  AMMONIA 23   CBC: Recent Labs  Lab 12/25/18 0320 12/16/2018 1346  WBC 11.0* 14.8*  NEUTROABS 9.8*  --   HGB 10.9* 12.6  HCT 36.6 43.3  MCV 106.4* 112.5*  PLT 247 364   Cardiac Enzymes: No results for input(s): CKTOTAL, CKMB, CKMBINDEX, TROPONINI in the last 168 hours. BNP: Invalid input(s): POCBNP CBG: Recent Labs  Lab 12/27/18 0731 12/27/18 1131  GLUCAP 189* 212*    Radiological Exams on Admission:  Ct Head Wo Contrast  Result Date: 12/23/2018 CLINICAL DATA:  Altered mental status EXAM: CT HEAD WITHOUT CONTRAST TECHNIQUE: Contiguous axial images were obtained from the base of the skull through the vertex without intravenous contrast. COMPARISON:  05/18/2012 FINDINGS: Brain: No evidence of acute infarction, hemorrhage, hydrocephalus, extra-axial collection or mass lesion/mass effect. Periventricular white matter hypodensity. Vascular: No hyperdense vessel or unexpected calcification. Skull: Normal. Negative for fracture or focal lesion. Sinuses/Orbits: No acute finding. Other: None. IMPRESSION: No acute intracranial pathology.  Small-vessel white matter disease. Electronically Signed   By: Eddie Candle M.D.   On: 12/30/2018 14:31   Dg Chest Portable 1 View  Result Date: 12/17/2018 CLINICAL DATA:  Altered level of consciousness. EXAM: PORTABLE CHEST 1 VIEW COMPARISON:  12/21/2018 FINDINGS: Cardiomegaly, cardiac valve replacement and LEFT-sided pacemaker/AICD noted.  Increasing pulmonary vascular congestion and interstitial opacities/possible edema noted. New RIGHT pleural effusion, possible LEFT pleural effusion and bilateral LOWER lung opacities/atelectasis noted. No definite pneumothorax. IMPRESSION: 1. Increasing pulmonary vascular congestion and interstitial opacities/possible edema. 2. New  RIGHT pleural effusion, possible LEFT pleural effusion and bilateral LOWER lung opacities/atelectasis. Electronically Signed   By: Margarette Canada M.D.   On: 12/18/2018 13:53      EKG: rate 66, Prolonged QTC 678, normal sinus rhythm, nonspecific T wave abnormalities   Assessment/Plan Principal Problem:   Acute respiratory failure with hypoxia ([place]), acute metabolic encephalopathy -Patient presented obtunded with multiorgan failure, in the setting of hypoxia and hypercarbia, may have underlying infectious source -ABG showed pH of 7.0 with PCO2> 100, with profound respiratory acidosis, was placed on BiPAP in the ED, however not a candidate for BiPAP given her mental status -Placed on as needed morphine for pain and comfort, will add scopolamine patch as needed for secretions, IV Ativan as needed for agitation -Discussed at bedside with patient's husband, he stated that patient would not want mechanical ventilation and requested for comfort care approach.   Active Problems: Sepsis in the setting of recent diverticulitis, possible urinary source -No further labs or IV fluids, antibiotics -Comfort care approach  Acute kidney injury in the setting of chronic kidney disease stage III, hyperkalemia -Creatinine 2.49 at the time of admission, was 1.0 on 10/19.  Potassium 5.7 -Comfort measures only  History of ischemic cardiomyopathy, A. fib, hypertension -Comfort care  Macrocytic anemia, MCV 112.5 -Comfort care  Diabetes mellitus type 2 with hyperglycemia, uncontrolled with CKDIII Hemoglobin A1c 5.9 on 10/15  DVT prophylaxis: None, comfort care  CODE STATUS:  DNR/DNI, comfort care, discussed with patient's husband at the bedside  Consults called: Patient was seen by critical care, Dr. Lamonte Sakai  Family Communication: Admission, patients condition and plan of care including tests being ordered have been discussed with the patient's husband who indicates understanding and agree with the plan and Code Status  Admission status: Inpatient palliative care  The medical decision making on this patient was of high complexity and the patient is at high risk for clinical deterioration, therefore this is a level 3 admission.  Severity of Illness:      The appropriate patient status for this patient is INPATIENT. Inpatient status is judged to be reasonable and necessary in order to provide the required intensity of service to ensure the patient's safety. The patient's presenting symptoms, physical exam findings, and initial radiographic and laboratory data in the context of their chronic comorbidities is felt to place them at high risk for further clinical deterioration. Furthermore, it is not anticipated that the patient will be medically stable for discharge from the hospital within 2 midnights of admission. The following factors support the patient status of inpatient.   " The patient's presenting symptoms include obtunded, acute metabolic encephalopathy, acute respiratory failure with multiorgan failure " The worrisome physical exam findings include obtunded, grimacing, agonal breathing " The initial radiographic and laboratory data are worrisome because of profound respiratory acidosis, acute kidney injury, hyperkalemia " The chronic co-morbidities include diabetes mellitus, CAD, hypertension, ischemic cardiomyopathy, recent diverticulitis, chronic kidney disease   * I certify that at the point of admission it is my clinical judgment that the patient will require inpatient hospital care spanning beyond 2 midnights from the point of admission due to high intensity  of service, high risk for further deterioration and high frequency of surveillance required.*    Time Spent on Admission: 60 minutes     Casandra Dallaire M.D. [facility] 12/16/2018, 4:49 PM

## 2018-12-29 NOTE — ED Notes (Signed)
Patient transported to X-ray 

## 2018-12-29 NOTE — Progress Notes (Signed)
A consult was received from an ED physician for Cefepome per pharmacy dosing.  The patient's profile has been reviewed for ht/wt/allergies/indication/available labs.    A one time order has been placed for Cefepime 2gm by ED MD.  Further antibiotics/pharmacy consults should be ordered by admitting physician if indicated.                       Thank you,  Minda Ditto PharmD 12/15/2018  2:09 PM

## 2018-12-30 ENCOUNTER — Other Ambulatory Visit: Payer: Self-pay

## 2018-12-30 DIAGNOSIS — L8990 Pressure ulcer of unspecified site, unspecified stage: Secondary | ICD-10-CM | POA: Insufficient documentation

## 2018-12-30 LAB — MRSA PCR SCREENING: MRSA by PCR: NEGATIVE

## 2018-12-30 MED ORDER — LIP MEDEX EX OINT
TOPICAL_OINTMENT | CUTANEOUS | Status: AC
  Filled 2018-12-30: qty 7

## 2018-12-30 MED ORDER — ORAL CARE MOUTH RINSE: 15.0000 mL | Freq: Two times a day (BID) | OROMUCOSAL | Status: DC

## 2018-12-30 MED ORDER — CHLORHEXIDINE GLUCONATE 0.12 % MT SOLN
15.0000 mL | Freq: Two times a day (BID) | OROMUCOSAL | Status: DC
  Filled 2018-12-30: qty 15

## 2018-12-30 NOTE — Progress Notes (Signed)
[facility]                                                                              Patient Demographics  [name], is a 78 y.o. female, DOB - 17-Mar-1940, NLG:[id number]  Admit date - 12/07/2018   Admitting Physician Emersyn Kotarski Krystal Eaton, MD  Outpatient Primary MD for the patient is Velna Hatchet, MD  Outpatient specialists:   LOS - 1  days   Medical records reviewed and are as summarized below:    Chief Complaint  Patient presents with   Altered Mental Status       Brief summary   Patient is a 78 year old female with history of prior CVA, hypertension, CAD, chronic systolic CHF with EF 08%, status post AICD, severe aortic stenosis, mitral regurgitation, atrial fibrillation, thoracic aortic aneurysm, GERD, diabetes mellitus, chronic respiratory failure with 2 L O2 at baseline, who was recently admitted from 10/15-10/21 with acute diverticulitis.  Patient was treated with IV antibiotics and was tolerating diet at the time of discharge.  She was discharged to skilled nursing facility, [facility]. Patient was sent from SNF for decreased consciousness, decreased p.o. intake, had not been eating anything for the last 2 days.  In ED, patient was found to be obtunded, was placed on BiPAP.  She appeared to be grimacing and somewhat in agonal respiratory pattern.  Husband at the bedside.  CT head was negative however blood gas showed profound respiratory acidosis with pH of 7.0, PCO2> 100.  Patient was placed on BiPAP. Patient was evaluated by critical care, Dr. Lamonte Sakai had discussed CODE STATUS with the patient's husband, overall poor prognosis, critically ill with multiorgan dysfunction.  Patient was transitioned over to comfort care approach which was reaffirmed by myself with the patient's husband.   Assessment & Plan      Acute respiratory failure with hypoxia (HCC), acute metabolic encephalopathy -Patient had presented obtunded with multiorgan failure, in the  setting of hypoxia and hypercarbia, ABG pH 7.0, PCO2> 100, profound respiratory acidosis.  Patient was placed on BiPAP in ED.  Patient was transitioned to comfort care status after discussion with critical care. -Appears to be comfortable, continue morphine IV as needed for pain and comfort, IV Ativan as needed.  Husband at the bedside, provided support. -Continue comfort care  Sepsis in the setting of recent diverticulitis, possible urinary source -Continue comfort care,  Acute kidney injury in the setting of chronic kidney disease stage III, hyperkalemia -Creatinine 2.49 at the time of admission, was 1.0 on 10/19.  Potassium 5.7 -Continue comfort care  History of ischemic cardiomyopathy, A. fib, hypertension -Continue comfort care  Macrocytic anemia, MCV 112.5 -Comfort care  Diabetes mellitus type 2 with hyperglycemia, uncontrolled with CKDIII Hemoglobin A1c 5.9 on 10/15     Pressure injury of skin - Sacrum stage I  Code Status: Comfort care, DNR/DNI DVT Prophylaxis: Comfort care Family Communication: Patient's husband at the bedside   Disposition Plan: Expected inpatient hospital death, likely hours to days  Time Spent in minutes 20 minutes  Procedures:  None  Consultants:   Critical care medicine  Antimicrobials:   Anti-infectives (From admission, onward)  Start     Dose/Rate Route Frequency Ordered Stop   12/08/2018 1415  ceFEPIme (MAXIPIME) 2 g in sodium chloride 0.9 % 100 mL IVPB     2 g 200 mL/hr over 30 Minutes Intravenous  Once 12/26/2018 1404 12/09/2018 1516   12/13/2018 1415  metroNIDAZOLE (FLAGYL) IVPB 500 mg     500 mg 100 mL/hr over 60 Minutes Intravenous  Once 12/28/2018 1404 12/30/2018 1632          Medications  Scheduled Meds:  chlorhexidine  15 mL Mouth Rinse BID   lip balm       mouth rinse  15 mL Mouth Rinse q12n4p   Continuous Infusions: PRN Meds:.acetaminophen **OR** acetaminophen, LORazepam, morphine injection, [DISCONTINUED]  ondansetron **OR** ondansetron (ZOFRAN) IV      Subjective:   [name] was seen and examined today.  Appears comfortable, not responding to any verbal commands or painful stimuli, shallow breathing  Objective:   Vitals:   12/11/2018 1602 01/02/2019 1630 12/22/2018 1730 12/23/2018 1938  BP:  (!) 93/41 (!) 107/49 (!) 94/45  Pulse:  63 66 71  Resp:  11 20 16   Temp:    (!) 97.4 F (36.3 C)  TempSrc:    Oral  SpO2: 97% 99% 100% (!) 89%  Weight:      Height:        Intake/Output Summary (Last 24 hours) at 12/30/2018 1156 Last data filed at 12/10/2018 1441 Gross per 24 hour  Intake 1000 ml  Output --  Net 1000 ml     Wt Readings from Last 3 Encounters:  12/17/2018 61.4 kg  12/24/18 61.4 kg  06/14/18 67.1 kg     Exam  General: Unresponsive, agonal breathing, appears comfortable  Eyes:   HEENT:   Cardiovascular: S1 S2 auscultated  Respiratory: Shallow breathing, decreased breath sounds  Gastrointestinal: Soft  Ext: no pedal edema bilaterally  Neuro:  Musculoskeletal:   Skin: No rashes  Psych:    Data Reviewed:  I have personally reviewed following labs and imaging studies  Micro Results Recent Results (from the past 240 hour(s))  SARS Coronavirus 2 by RT PCR (hospital order, performed in [place] hospital lab) Nasopharyngeal Nasopharyngeal Swab     Status: None   Collection Time: 12/21/18  2:56 PM   Specimen: Nasopharyngeal Swab  Result Value Ref Range Status   SARS Coronavirus 2 NEGATIVE NEGATIVE Final    Comment: (NOTE) If result is NEGATIVE SARS-CoV-2 target nucleic acids are NOT DETECTED. The SARS-CoV-2 RNA is generally detectable in upper and lower  respiratory specimens during the acute phase of infection. The lowest  concentration of SARS-CoV-2 viral copies this assay can detect is 250  copies / mL. A negative result does not preclude SARS-CoV-2 infection  and should not be used as the sole basis for treatment or other  patient management  decisions.  A negative result may occur with  improper specimen collection / handling, submission of specimen other  than nasopharyngeal swab, presence of viral mutation(s) within the  areas targeted by this assay, and inadequate number of viral copies  (<250 copies / mL). A negative result must be combined with clinical  observations, patient history, and epidemiological information. If result is POSITIVE SARS-CoV-2 target nucleic acids are DETECTED. The SARS-CoV-2 RNA is generally detectable in upper and lower  respiratory specimens dur ing the acute phase of infection.  Positive  results are indicative of active infection with SARS-CoV-2.  Clinical  correlation with patient history and other diagnostic  information is  necessary to determine patient infection status.  Positive results do  not rule out bacterial infection or co-infection with other viruses. If result is PRESUMPTIVE POSTIVE SARS-CoV-2 nucleic acids MAY BE PRESENT.   A presumptive positive result was obtained on the submitted specimen  and confirmed on repeat testing.  While 2019 novel coronavirus  (SARS-CoV-2) nucleic acids may be present in the submitted sample  additional confirmatory testing may be necessary for epidemiological  and / or clinical management purposes  to differentiate between  SARS-CoV-2 and other Sarbecovirus currently known to infect humans.  If clinically indicated additional testing with an alternate test  methodology [phone]) is advised. The SARS-CoV-2 RNA is generally  detectable in upper and lower respiratory sp ecimens during the acute  phase of infection. The expected result is Negative. Fact Sheet for Patients:  StrictlyIdeas.no Fact Sheet for Healthcare Providers: BankingDealers.co.za This test is not yet approved or cleared by the [place] FDA and has been authorized for detection and/or diagnosis of SARS-CoV-2 by FDA under an  Emergency Use Authorization (EUA).  This EUA will remain in effect (meaning this test can be used) for the duration of the COVID-19 declaration under Section 564(b)(1) of the Act, 21 U.S.C. section 360bbb-3(b)(1), unless the authorization is terminated or revoked sooner. Performed at [facility], [place] [address], [place], [place] [address]   Culture, Urine     Status: None   Collection Time: 12/22/18  4:49 PM   Specimen: Urine, Catheterized  Result Value Ref Range Status   Specimen Description   Final    URINE, CATHETERIZED Performed at [facility] [address] [place], [place] [address]    Special Requests   Final    NONE Performed at [facility], [place] [address], [place], [place] [address]    Culture   Final    NO GROWTH Performed at [facility], [place] [address] [place], [place] [address]    Report Status 12/24/2018 FINAL  Final  SARS CORONAVIRUS 2 (TAT 6-24 HRS) Nasopharyngeal Nasopharyngeal Swab     Status: None   Collection Time: 12/26/18 11:01 AM   Specimen: Nasopharyngeal Swab  Result Value Ref Range Status   SARS Coronavirus 2 NEGATIVE NEGATIVE Final    Comment: (NOTE) SARS-CoV-2 target nucleic acids are NOT DETECTED. The SARS-CoV-2 RNA is generally detectable in upper and lower respiratory specimens during the acute phase of infection. Negative results do not preclude SARS-CoV-2 infection, do not rule out co-infections with other pathogens, and should not be used as the sole basis for treatment or other patient management decisions. Negative results must be combined with clinical observations, patient history, and epidemiological information. The expected result is Negative. Fact Sheet for Patients: SugarRoll.be Fact Sheet for Healthcare Providers: https://www.woods-mathews.com/ This test is not yet approved or cleared by the [place] FDA and  has  been authorized for detection and/or diagnosis of SARS-CoV-2 by FDA under an Emergency Use Authorization (EUA). This EUA will remain  in effect (meaning this test can be used) for the duration of the COVID-19 declaration under Section 56 4(b)(1) of the Act, 21 U.S.C. section 360bbb-3(b)(1), unless the authorization is terminated or revoked sooner. Performed at [facility], [place] [address] [place], [place] [address]   SARS Coronavirus 2 by RT PCR (hospital order, performed in [facility] hospital lab) Nasopharyngeal Nasopharyngeal Swab     Status: None   Collection Time: 01/01/2019  3:54 PM  Specimen: Nasopharyngeal Swab  Result Value Ref Range Status   SARS Coronavirus 2 NEGATIVE NEGATIVE Final    Comment: (NOTE) If result is NEGATIVE SARS-CoV-2 target nucleic acids are NOT DETECTED. The SARS-CoV-2 RNA is generally detectable in upper and lower  respiratory specimens during the acute phase of infection. The lowest  concentration of SARS-CoV-2 viral copies this assay can detect is 250  copies / mL. A negative result does not preclude SARS-CoV-2 infection  and should not be used as the sole basis for treatment or other  patient management decisions.  A negative result may occur with  improper specimen collection / handling, submission of specimen other  than nasopharyngeal swab, presence of viral mutation(s) within the  areas targeted by this assay, and inadequate number of viral copies  (<250 copies / mL). A negative result must be combined with clinical  observations, patient history, and epidemiological information. If result is POSITIVE SARS-CoV-2 target nucleic acids are DETECTED. The SARS-CoV-2 RNA is generally detectable in upper and lower  respiratory specimens dur ing the acute phase of infection.  Positive  results are indicative of active infection with SARS-CoV-2.  Clinical  correlation with patient history and other diagnostic information is  necessary to  determine patient infection status.  Positive results do  not rule out bacterial infection or co-infection with other viruses. If result is PRESUMPTIVE POSTIVE SARS-CoV-2 nucleic acids MAY BE PRESENT.   A presumptive positive result was obtained on the submitted specimen  and confirmed on repeat testing.  While 2019 novel coronavirus  (SARS-CoV-2) nucleic acids may be present in the submitted sample  additional confirmatory testing may be necessary for epidemiological  and / or clinical management purposes  to differentiate between  SARS-CoV-2 and other Sarbecovirus currently known to infect humans.  If clinically indicated additional testing with an alternate test  methodology [phone]) is advised. The SARS-CoV-2 RNA is generally  detectable in upper and lower respiratory sp ecimens during the acute  phase of infection. The expected result is Negative. Fact Sheet for Patients:  StrictlyIdeas.no Fact Sheet for Healthcare Providers: BankingDealers.co.za This test is not yet approved or cleared by the [place] FDA and has been authorized for detection and/or diagnosis of SARS-CoV-2 by FDA under an Emergency Use Authorization (EUA).  This EUA will remain in effect (meaning this test can be used) for the duration of the COVID-19 declaration under Section 564(b)(1) of the Act, 21 U.S.C. section 360bbb-3(b)(1), unless the authorization is terminated or revoked sooner. Performed at [facility], [place] [address], [place], [place] [address]   MRSA PCR Screening     Status: None   Collection Time: 12/30/18  5:01 AM   Specimen: Nasopharyngeal  Result Value Ref Range Status   MRSA by PCR NEGATIVE NEGATIVE Final    Comment:        The GeneXpert MRSA Assay (FDA approved for NASAL specimens only), is one component of a comprehensive MRSA colonization surveillance program. It is not intended to diagnose MRSA infection nor  to guide or monitor treatment for MRSA infections. Performed at [facility], [place] [address], [place], [place] [address]     Radiology Reports Ct Head Wo Contrast  Result Date: 12/24/2018 CLINICAL DATA:  Altered mental status EXAM: CT HEAD WITHOUT CONTRAST TECHNIQUE: Contiguous axial images were obtained from the base of the skull through the vertex without intravenous contrast. COMPARISON:  05/18/2012 FINDINGS: Brain: No evidence of acute infarction, hemorrhage, hydrocephalus, extra-axial collection or mass lesion/mass  effect. Periventricular white matter hypodensity. Vascular: No hyperdense vessel or unexpected calcification. Skull: Normal. Negative for fracture or focal lesion. Sinuses/Orbits: No acute finding. Other: None. IMPRESSION: No acute intracranial pathology.  Small-vessel white matter disease. Electronically Signed   By: Eddie Candle M.D.   On: 12/08/2018 14:31   US Renal  Result Date: 12/23/2018 CLINICAL DATA:  Evaluate left-sided hydronephrosis. EXAM: RENAL / URINARY TRACT ULTRASOUND COMPLETE COMPARISON:  CT scan December 21, 2018 FINDINGS: Right Kidney: Renal measurements: 9.7 x 3.9 x 4.1 cm = volume: 81.9 mL. A 5.5 cm cyst was measured on the right. No solid masses identified. A 7 mm stone is noted. No hydronephrosis. Left Kidney: Renal measurements: 9.9 x 3.6 x 3.6 cm = volume: 65.5 mL. Mild hydronephrosis. Left-sided stones are seen. The largest stone measures 15 mm. Bladder: Appears normal for degree of bladder distention. Other: Calcified fibroids are seen in the uterus. IMPRESSION: 1. Mild hydronephrosis remains on the left. Stones are seen in the left kidney. The largest stone measures 15 mm. 2. No hydronephrosis on the right. There is a 7 mm stone on the right. 3. A 5.5 cm cyst was measured on the right. The etiology of this finding is unclear as there is no right renal cyst on the CT scan from December 21, 2018. 4. Fibroid uterus. Electronically Signed   By:  Dorise Bullion III M.D   On: 12/23/2018 17:30   Dg Chest Portable 1 View  Result Date: 01/03/2019 CLINICAL DATA:  Altered level of consciousness. EXAM: PORTABLE CHEST 1 VIEW COMPARISON:  12/21/2018 FINDINGS: Cardiomegaly, cardiac valve replacement and LEFT-sided pacemaker/AICD noted. Increasing pulmonary vascular congestion and interstitial opacities/possible edema noted. New RIGHT pleural effusion, possible LEFT pleural effusion and bilateral LOWER lung opacities/atelectasis noted. No definite pneumothorax. IMPRESSION: 1. Increasing pulmonary vascular congestion and interstitial opacities/possible edema. 2. New RIGHT pleural effusion, possible LEFT pleural effusion and bilateral LOWER lung opacities/atelectasis. Electronically Signed   By: Margarette Canada M.D.   On: 01/01/2019 13:53   Dg Chest Port 1 View  Result Date: 12/21/2018 CLINICAL DATA:  Weakness EXAM: PORTABLE CHEST 1 VIEW COMPARISON:  11/28/2017 FINDINGS: Left AICD remains in place, unchanged. Prior median sternotomy and valve replacement. Cardiomegaly. Prominence of the right hilum could be vascular although adenopathy cannot be excluded. No confluent opacities, effusions or edema. No acute bony abnormality. IMPRESSION: Cardiomegaly. Prominence of the right hilum felt to most likely be related to vascular congestion although adenopathy cannot be completely excluded. This could be further evaluated with chest CT with IV contrast if felt clinically indicated. Electronically Signed   By: Rolm Baptise M.D.   On: 12/21/2018 10:20   Ct Renal Stone Study  Result Date: 12/21/2018 CLINICAL DATA:  Weakness, flank pain EXAM: CT ABDOMEN AND PELVIS WITHOUT CONTRAST TECHNIQUE: Multidetector CT imaging of the abdomen and pelvis was performed following the standard protocol without IV contrast. COMPARISON:  03/31/2018 FINDINGS: Lower chest: Cardiomegaly. Lung bases clear. Hepatobiliary: Liver is within normal limits. Numerous layering stones within the  gallbladder lumen. No biliary dilatation. Pancreas: Unremarkable. No pancreatic ductal dilatation or surrounding inflammatory changes. Spleen: No acute abnormality. Adrenals/Urinary Tract: Thickened appearance of the left adrenal gland is unchanged. Right adrenal gland is normal. Multiple large renal calculi bilaterally. Largest on the right measures 1.6 x 0.9 cm. Calculus within the left renal pelvis measures 1.8 x 1.0 cm. Mild-to-moderate left-sided hydronephrosis. Bilateral ureters are nondilated. 5 mm stone at the left ureterovesical junction. Urinary bladder is grossly unremarkable. 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 There is a large rounded air-filled structure adjacent to the sigmoid colon measuring 7.8 x 7.3 cm with mildly thickened wall, new from prior suggestive of a giant colonic diverticulum (series 3, image 50). There is moderate free fluid adjacent to this diverticulum. No well-defined fluid collection to suggest abscess. No evidence of perforation. Small hiatal hernia. Stomach and small bowel otherwise unremarkable. No dilated small bowel. Vascular/Lymphatic: No significant vascular findings are present. No enlarged abdominal or pelvic lymph nodes. Reproductive: Anteverted uterus with multiple coarsely calcified fibroids. No adnexal masses. Other: No abdominal wall hernia. Musculoskeletal: Multiple prior vertebral body compression deformities including T11, T12, L1, L3, and L4. No new or worsening vertebral body compression fracture compared to 03/31/2018. No acute osseous findings. IMPRESSION: 1. 5 mm left UVJ stone with mild-to-moderate left-sided hydroureteronephrosis. Multiple additional bilateral nonobstructing renal calculi. 2. New 7.8 x 7.3 cm rounded air-filled structure immediately adjacent to the sigmoid colon with mildly thickened wall and adjacent free fluid. 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 No abscess or  evidence of perforation. 3. Uncomplicated cholelithiasis. 4. Fibroid uterus. 5. Multiple prior vertebral body compression deformities. No new or worsening vertebral body compression fracture compared to prior. These results were called by telephone at the time of interpretation on 12/21/2018 at 2:14 pm to provider Veryl Speak , who verbally acknowledged these results. Electronically Signed   By: Davina Poke M.D.   On: 12/21/2018 14:16    Lab Data:  CBC: Recent Labs  Lab 12/24/18 0402 12/25/18 0320 12/17/2018 1346  WBC 8.2 11.0* 14.8*  NEUTROABS 6.9 9.8*  --   HGB 10.8* 10.9* 12.6  HCT 35.3* 36.6 43.3  MCV 104.1* 106.4* 112.5*  PLT 260 247 601   Basic Metabolic Panel: Recent Labs  Lab 12/24/18 0402 12/25/18 0320 01/02/2019 1346  NA 143 145 147*  K 3.9 3.9 5.7*  CL 112* 117* 115*  CO2 21* 20* 26  GLUCOSE 96 118* 226*  BUN 22 21 61*  CREATININE 1.24* 1.08* 2.49*  CALCIUM 7.6* 7.2* 8.4*   GFR: Estimated Creatinine Clearance: 17.7 mL/min (A) (by C-G formula based on SCr of 2.49 mg/dL (H)). Liver Function Tests: Recent Labs  Lab 12/13/2018 1346  AST 11*  ALT 11  ALKPHOS 34*  BILITOT 0.3  PROT 6.6  ALBUMIN 2.9*   Recent Labs  Lab 12/28/2018 1346  LIPASE 35   Recent Labs  Lab 12/30/2018 1348  AMMONIA 23   Coagulation Profile: Recent Labs  Lab 12/24/18 0402 12/25/18 0320 12/26/18 0411 12/27/18 0429 12/17/2018 1304  INR 3.6* 4.1* 3.1* 2.2* 2.8*   Cardiac Enzymes: No results for input(s): CKTOTAL, CKMB, CKMBINDEX, TROPONINI in the last 168 hours. BNP (last 3 results) No results for input(s): PROBNP in the last 8760 hours. HbA1C: No results for input(s): HGBA1C in the last 72 hours. CBG: Recent Labs  Lab 12/26/18 1138 12/26/18 1646 12/26/18 2147 12/27/18 0731 12/27/18 1131  GLUCAP 211* 194* 195* 189* 212*   Lipid Profile: No results for input(s): CHOL, HDL, LDLCALC, TRIG, CHOLHDL, LDLDIRECT in the last 72 hours. Thyroid Function Tests: No results for  input(s): TSH, T4TOTAL, FREET4, T3FREE, THYROIDAB in the last 72 hours. Anemia Panel: No results for input(s): VITAMINB12, FOLATE, FERRITIN, TIBC, IRON, RETICCTPCT in the last 72 hours. Urine analysis:    Component Value Date/Time   COLORURINE RED (A) 12/21/2018 1401   APPEARANCEUR CLOUDY (A) 12/10/2018 1401   LABSPEC 1.020 12/30/2018 1401   PHURINE 5.0 12/14/2018  [place] 12/14/2018 1401   GLUCOSEU NEGATIVE 03/14/2012 1142   HGBUR LARGE (A) 12/16/2018 1401   HGBUR negative 11/26/2008 0934   BILIRUBINUR NEGATIVE 12/23/2018 1401   BILIRUBINUR negative 07/21/2012 0909   KETONESUR 5 (A) 01/06/2019 1401   PROTEINUR 100 (A) 01/05/2019 1401   UROBILINOGEN 0.2 11/11/2014 1200   NITRITE POSITIVE (A) 12/25/2018 1401   LEUKOCYTESUR TRACE (A) 12/23/2018 1401     Jaquel Coomer M.D. [facility] 12/30/2018, 11:56 AM  Pager: [phone] Between 7am to 7pm - call Pager - [phone]  After 7pm go to [email or web address] - password [email or web address]  Call night coverage person covering after 7pm

## 2018-12-31 DIAGNOSIS — G9341 Metabolic encephalopathy: Secondary | ICD-10-CM | POA: Diagnosis not present

## 2018-12-31 DIAGNOSIS — J9601 Acute respiratory failure with hypoxia: Secondary | ICD-10-CM | POA: Diagnosis not present

## 2018-12-31 DIAGNOSIS — K5792 Diverticulitis of intestine, part unspecified, without perforation or abscess without bleeding: Secondary | ICD-10-CM | POA: Diagnosis not present

## 2019-01-07 NOTE — Progress Notes (Signed)
[facility]                                                                              Patient Demographics  [name], is a 78 y.o. female, DOB - [birth year], WCB:[id number]  Admit date - 01/02/2019   Admitting Physician Raquell Richer Krystal Eaton, MD  Outpatient Primary MD for the patient is Velna Hatchet, MD  Outpatient specialists:   LOS - 2  days   Medical records reviewed and are as summarized below:    Chief Complaint  Patient presents with   Altered Mental Status       Brief summary   Patient is a 78 year old female with history of prior CVA, hypertension, CAD, chronic systolic CHF with EF 61%, status post AICD, severe aortic stenosis, mitral regurgitation, atrial fibrillation, thoracic aortic aneurysm, GERD, diabetes mellitus, chronic respiratory failure with 2 L O2 at baseline, who was recently admitted from 10/15-10/21 with acute diverticulitis.  Patient was treated with IV antibiotics and was tolerating diet at the time of discharge.  She was discharged to skilled nursing facility, [facility]. Patient was sent from SNF for decreased consciousness, decreased p.o. intake, had not been eating anything for the last 2 days.  In ED, patient was found to be obtunded, was placed on BiPAP.  She appeared to be grimacing and somewhat in agonal respiratory pattern.  Husband at the bedside.  CT head was negative however blood gas showed profound respiratory acidosis with pH of 7.0, PCO2> 100.  Patient was placed on BiPAP. Patient was evaluated by critical care, Dr. Lamonte Sakai had discussed CODE STATUS with the patient's husband, overall poor prognosis, critically ill with multiorgan dysfunction.  Patient was transitioned over to comfort care approach which was reaffirmed by myself with the patient's husband.   Assessment & Plan      Acute respiratory failure with hypoxia (HCC), acute metabolic encephalopathy -Patient had presented obtunded with multiorgan failure, in the  setting of hypoxia and hypercarbia, ABG pH 7.0, PCO2> 100, profound respiratory acidosis.  Patient was placed on BiPAP in ED.  Patient was transitioned to comfort care status after discussion with critical care. -Continue morphine IV as needed for pain, comfort, IV Ativan as needed.  Appears to be comfortable -Continue comfort care, husband at bedside.  Sepsis in the setting of recent diverticulitis, possible urinary source -Continue comfort care  Acute kidney injury in the setting of chronic kidney disease stage III, hyperkalemia -Creatinine 2.49 at the time of admission, was 1.0 on 10/19.  Potassium 5.7 -Continue comfort care  History of ischemic cardiomyopathy, A. fib, hypertension -Continue comfort care  Macrocytic anemia, MCV 112.5 -Comfort care  Diabetes mellitus type 2 with hyperglycemia, uncontrolled with CKDIII Hemoglobin A1c 5.9 on 10/15     Pressure injury of skin - Sacrum stage I  Code Status: Comfort care, DNR/DNI DVT Prophylaxis: Comfort care Family Communication: Patient's husband at bedside   Disposition Plan: Expected inpatient hospital death  Time Spent in minutes 20 minutes  Procedures:  None  Consultants:   Critical care medicine  Antimicrobials:   Anti-infectives (From admission, onward)   Start     Dose/Rate Route  Frequency Ordered Stop   01/05/2019 1415  ceFEPIme (MAXIPIME) 2 g in sodium chloride 0.9 % 100 mL IVPB     2 g 200 mL/hr over 30 Minutes Intravenous  Once 12/16/2018 1404 12/16/2018 1516   12/16/2018 1415  metroNIDAZOLE (FLAGYL) IVPB 500 mg     500 mg 100 mL/hr over 60 Minutes Intravenous  Once 12/23/2018 1404 01/04/2019 1632         Medications  Scheduled Meds:  chlorhexidine  15 mL Mouth Rinse BID   mouth rinse  15 mL Mouth Rinse q12n4p   Continuous Infusions: PRN Meds:.acetaminophen **OR** acetaminophen, LORazepam, morphine injection, [DISCONTINUED] ondansetron **OR** ondansetron (ZOFRAN) IV      Subjective:   [name] was seen and examined today.  Appears comfortable, shallow breathing, unresponsive.  Husband at bedside  Objective:   Vitals:   01/04/2019 1630 12/30/2018 1730 12/15/2018 1938 2019/01/22 0535  BP: (!) 93/41 (!) 107/49 (!) 94/45 (!) 94/48  Pulse: 63 66 71 76  Resp: 11 20 16 18   Temp:   (!) 97.4 F (36.3 C) 98.8 F (37.1 C)  TempSrc:   Oral Oral  SpO2: 99% 100% (!) 89% 96%  Weight:      Height:        Intake/Output Summary (Last 24 hours) at 22-Jan-2019 1231 Last data filed at 01-22-19 0542 Gross per 24 hour  Intake --  Output 250 ml  Net -250 ml     Wt Readings from Last 3 Encounters:  01/04/2019 61.4 kg  12/24/18 61.4 kg  06/14/18 67.1 kg   Physical Exam  General: Unresponsive, shallow breathing, comfortable  Eyes:   HEENT:    Cardiovascular: S1 S2 clear,  No pedal edema b/l  Respiratory: Shallow breathing, decreased breath sound  Gastrointestinal:   Ext:  Neuro:  Musculoskeletal:   Skin:   Psych: Unresponsive, appears comfortable    Data Reviewed:  I have personally reviewed following labs and imaging studies  Micro Results Recent Results (from the past 240 hour(s))  SARS Coronavirus 2 by RT PCR (hospital order, performed in [place] hospital lab) Nasopharyngeal Nasopharyngeal Swab     Status: None   Collection Time: 12/21/18  2:56 PM   Specimen: Nasopharyngeal Swab  Result Value Ref Range Status   SARS Coronavirus 2 NEGATIVE NEGATIVE Final    Comment: (NOTE) If result is NEGATIVE SARS-CoV-2 target nucleic acids are NOT DETECTED. The SARS-CoV-2 RNA is generally detectable in upper and lower  respiratory specimens during the acute phase of infection. The lowest  concentration of SARS-CoV-2 viral copies this assay can detect is 250  copies / mL. A negative result does not preclude SARS-CoV-2 infection  and should not be used as the sole basis for treatment or other  patient management decisions.  A negative result may occur with  improper  specimen collection / handling, submission of specimen other  than nasopharyngeal swab, presence of viral mutation(s) within the  areas targeted by this assay, and inadequate number of viral copies  (<250 copies / mL). A negative result must be combined with clinical  observations, patient history, and epidemiological information. If result is POSITIVE SARS-CoV-2 target nucleic acids are DETECTED. The SARS-CoV-2 RNA is generally detectable in upper and lower  respiratory specimens dur ing the acute phase of infection.  Positive  results are indicative of active infection with SARS-CoV-2.  Clinical  correlation with patient history and other diagnostic information is  necessary to determine patient infection status.  Positive results do  not rule out bacterial infection or co-infection with other viruses. If result is PRESUMPTIVE POSTIVE SARS-CoV-2 nucleic acids MAY BE PRESENT.   A presumptive positive result was obtained on the submitted specimen  and confirmed on repeat testing.  While 2019 novel coronavirus  (SARS-CoV-2) nucleic acids may be present in the submitted sample  additional confirmatory testing may be necessary for epidemiological  and / or clinical management purposes  to differentiate between  SARS-CoV-2 and other Sarbecovirus currently known to infect humans.  If clinically indicated additional testing with an alternate test  methodology [phone]) is advised. The SARS-CoV-2 RNA is generally  detectable in upper and lower respiratory sp ecimens during the acute  phase of infection. The expected result is Negative. Fact Sheet for Patients:  StrictlyIdeas.no Fact Sheet for Healthcare Providers: BankingDealers.co.za This test is not yet approved or cleared by the [place] FDA and has been authorized for detection and/or diagnosis of SARS-CoV-2 by FDA under an Emergency Use Authorization (EUA).  This EUA will remain in  effect (meaning this test can be used) for the duration of the COVID-19 declaration under Section 564(b)(1) of the Act, 21 U.S.C. section 360bbb-3(b)(1), unless the authorization is terminated or revoked sooner. Performed at [facility], [place] [address] [place], [place] [address]   Culture, Urine     Status: None   Collection Time: 12/22/18  4:49 PM   Specimen: Urine, Catheterized  Result Value Ref Range Status   Specimen Description   Final    URINE, CATHETERIZED Performed at [place] [address] [place], [place] [address]    Special Requests   Final    NONE Performed at [facility], [place] [address] [place], [place] [address]    Culture   Final    NO GROWTH Performed at [facility], [place] [address], [place], [place] [address]    Report Status 12/24/2018 FINAL  Final  SARS CORONAVIRUS 2 (TAT 6-24 HRS) Nasopharyngeal Nasopharyngeal Swab     Status: None   Collection Time: 12/26/18 11:01 AM   Specimen: Nasopharyngeal Swab  Result Value Ref Range Status   SARS Coronavirus 2 NEGATIVE NEGATIVE Final    Comment: (NOTE) SARS-CoV-2 target nucleic acids are NOT DETECTED. The SARS-CoV-2 RNA is generally detectable in upper and lower respiratory specimens during the acute phase of infection. Negative results do not preclude SARS-CoV-2 infection, do not rule out co-infections with other pathogens, and should not be used as the sole basis for treatment or other patient management decisions. Negative results must be combined with clinical observations, patient history, and epidemiological information. The expected result is Negative. Fact Sheet for Patients: SugarRoll.be Fact Sheet for Healthcare Providers: https://www.woods-mathews.com/ This test is not yet approved or cleared by the [place] FDA and  has been authorized for detection and/or diagnosis of SARS-CoV-2  by FDA under an Emergency Use Authorization (EUA). This EUA will remain  in effect (meaning this test can be used) for the duration of the COVID-19 declaration under Section 56 4(b)(1) of the Act, 21 U.S.C. section 360bbb-3(b)(1), unless the authorization is terminated or revoked sooner. Performed at [facility], [place] [address] [place],  [address]   SARS Coronavirus 2 by RT PCR (hospital order, performed in [facility] hospital lab) Nasopharyngeal Nasopharyngeal Swab     Status: None   Collection Time: 12/08/2018  3:54 PM   Specimen: Nasopharyngeal Swab  Result Value Ref Range Status   SARS Coronavirus 2  NEGATIVE NEGATIVE Final    Comment: (NOTE) If result is NEGATIVE SARS-CoV-2 target nucleic acids are NOT DETECTED. The SARS-CoV-2 RNA is generally detectable in upper and lower  respiratory specimens during the acute phase of infection. The lowest  concentration of SARS-CoV-2 viral copies this assay can detect is 250  copies / mL. A negative result does not preclude SARS-CoV-2 infection  and should not be used as the sole basis for treatment or other  patient management decisions.  A negative result may occur with  improper specimen collection / handling, submission of specimen other  than nasopharyngeal swab, presence of viral mutation(s) within the  areas targeted by this assay, and inadequate number of viral copies  (<250 copies / mL). A negative result must be combined with clinical  observations, patient history, and epidemiological information. If result is POSITIVE SARS-CoV-2 target nucleic acids are DETECTED. The SARS-CoV-2 RNA is generally detectable in upper and lower  respiratory specimens dur ing the acute phase of infection.  Positive  results are indicative of active infection with SARS-CoV-2.  Clinical  correlation with patient history and other diagnostic information is  necessary to determine patient infection status.  Positive results do  not rule  out bacterial infection or co-infection with other viruses. If result is PRESUMPTIVE POSTIVE SARS-CoV-2 nucleic acids MAY BE PRESENT.   A presumptive positive result was obtained on the submitted specimen  and confirmed on repeat testing.  While 2019 novel coronavirus  (SARS-CoV-2) nucleic acids may be present in the submitted sample  additional confirmatory testing may be necessary for epidemiological  and / or clinical management purposes  to differentiate between  SARS-CoV-2 and other Sarbecovirus currently known to infect humans.  If clinically indicated additional testing with an alternate test  methodology [phone]) is advised. The SARS-CoV-2 RNA is generally  detectable in upper and lower respiratory sp ecimens during the acute  phase of infection. The expected result is Negative. Fact Sheet for Patients:  StrictlyIdeas.no Fact Sheet for Healthcare Providers: BankingDealers.co.za This test is not yet approved or cleared by the [place] FDA and has been authorized for detection and/or diagnosis of SARS-CoV-2 by FDA under an Emergency Use Authorization (EUA).  This EUA will remain in effect (meaning this test can be used) for the duration of the COVID-19 declaration under Section 564(b)(1) of the Act, 21 U.S.C. section 360bbb-3(b)(1), unless the authorization is terminated or revoked sooner. Performed at [facility], [place] [address] [place], [place] [address]   MRSA PCR Screening     Status: None   Collection Time: 12/30/18  5:01 AM   Specimen: Nasopharyngeal  Result Value Ref Range Status   MRSA by PCR NEGATIVE NEGATIVE Final    Comment:        The GeneXpert MRSA Assay (FDA approved for NASAL specimens only), is one component of a comprehensive MRSA colonization surveillance program. It is not intended to diagnose MRSA infection nor to guide or monitor treatment for MRSA infections. Performed at  [facility], [place] [address], [place], [place] [address]     Radiology Reports Ct Head Wo Contrast  Result Date: 12/16/2018 CLINICAL DATA:  Altered mental status EXAM: CT HEAD WITHOUT CONTRAST TECHNIQUE: Contiguous axial images were obtained from the base of the skull through the vertex without intravenous contrast. COMPARISON:  05/18/2012 FINDINGS: Brain: No evidence of acute infarction, hemorrhage, hydrocephalus, extra-axial collection or mass lesion/mass effect. Periventricular white matter hypodensity. Vascular: No hyperdense vessel or unexpected calcification. Skull: Normal.  Negative for fracture or focal lesion. Sinuses/Orbits: No acute finding. Other: None. IMPRESSION: No acute intracranial pathology.  Small-vessel white matter disease. Electronically Signed   By: Eddie Candle M.D.   On: 01/06/2019 14:31   US Renal  Result Date: 12/23/2018 CLINICAL DATA:  Evaluate left-sided hydronephrosis. EXAM: RENAL / URINARY TRACT ULTRASOUND COMPLETE COMPARISON:  CT scan December 21, 2018 FINDINGS: Right Kidney: Renal measurements: 9.7 x 3.9 x 4.1 cm = volume: 81.9 mL. A 5.5 cm cyst was measured on the right. No solid masses identified. A 7 mm stone is noted. No hydronephrosis. Left Kidney: Renal measurements: 9.9 x 3.6 x 3.6 cm = volume: 65.5 mL. Mild hydronephrosis. Left-sided stones are seen. The largest stone measures 15 mm. Bladder: Appears normal for degree of bladder distention. Other: Calcified fibroids are seen in the uterus. IMPRESSION: 1. Mild hydronephrosis remains on the left. Stones are seen in the left kidney. The largest stone measures 15 mm. 2. No hydronephrosis on the right. There is a 7 mm stone on the right. 3. A 5.5 cm cyst was measured on the right. The etiology of this finding is unclear as there is no right renal cyst on the CT scan from December 21, 2018. 4. Fibroid uterus. Electronically Signed   By: Dorise Bullion III M.D   On: 12/23/2018 17:30   Dg Chest  Portable 1 View  Result Date: 12/08/2018 CLINICAL DATA:  Altered level of consciousness. EXAM: PORTABLE CHEST 1 VIEW COMPARISON:  12/21/2018 FINDINGS: Cardiomegaly, cardiac valve replacement and LEFT-sided pacemaker/AICD noted. Increasing pulmonary vascular congestion and interstitial opacities/possible edema noted. New RIGHT pleural effusion, possible LEFT pleural effusion and bilateral LOWER lung opacities/atelectasis noted. No definite pneumothorax. IMPRESSION: 1. Increasing pulmonary vascular congestion and interstitial opacities/possible edema. 2. New RIGHT pleural effusion, possible LEFT pleural effusion and bilateral LOWER lung opacities/atelectasis. Electronically Signed   By: Margarette Canada M.D.   On: 12/26/2018 13:53   Dg Chest Port 1 View  Result Date: 12/21/2018 CLINICAL DATA:  Weakness EXAM: PORTABLE CHEST 1 VIEW COMPARISON:  11/28/2017 FINDINGS: Left AICD remains in place, unchanged. Prior median sternotomy and valve replacement. Cardiomegaly. Prominence of the right hilum could be vascular although adenopathy cannot be excluded. No confluent opacities, effusions or edema. No acute bony abnormality. IMPRESSION: Cardiomegaly. Prominence of the right hilum felt to most likely be related to vascular congestion although adenopathy cannot be completely excluded. This could be further evaluated with chest CT with IV contrast if felt clinically indicated. Electronically Signed   By: Rolm Baptise M.D.   On: 12/21/2018 10:20   Ct Renal Stone Study  Result Date: 12/21/2018 CLINICAL DATA:  Weakness, flank pain EXAM: CT ABDOMEN AND PELVIS WITHOUT CONTRAST TECHNIQUE: Multidetector CT imaging of the abdomen and pelvis was performed following the standard protocol without IV contrast. COMPARISON:  03/31/2018 FINDINGS: Lower chest: Cardiomegaly. Lung bases clear. Hepatobiliary: Liver is within normal limits. Numerous layering stones within the gallbladder lumen. No biliary dilatation. Pancreas: Unremarkable.  No pancreatic ductal dilatation or surrounding inflammatory changes. Spleen: No acute abnormality. Adrenals/Urinary Tract: Thickened appearance of the left adrenal gland is unchanged. Right adrenal gland is normal. Multiple large renal calculi bilaterally. Largest on the right measures 1.6 x 0.9 cm. Calculus within the left renal pelvis measures 1.8 x 1.0 cm. Mild-to-moderate left-sided hydronephrosis. Bilateral ureters are nondilated. 5 mm stone at the left ureterovesical junction. Urinary bladder is grossly unremarkable. 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 There is a large rounded air-filled structure adjacent to  the sigmoid colon measuring 7.8 x 7.3 cm with mildly thickened wall, new from prior suggestive of a giant colonic diverticulum (series 3, image 50). There is moderate free fluid adjacent to this diverticulum. No well-defined fluid collection to suggest abscess. No evidence of perforation. Small hiatal hernia. Stomach and small bowel otherwise unremarkable. No dilated small bowel. Vascular/Lymphatic: No significant vascular findings are present. No enlarged abdominal or pelvic lymph nodes. Reproductive: Anteverted uterus with multiple coarsely calcified fibroids. No adnexal masses. Other: No abdominal wall hernia. Musculoskeletal: Multiple prior vertebral body compression deformities including T11, T12, L1, L3, and L4. No new or worsening vertebral body compression fracture compared to 03/31/2018. No acute osseous findings. IMPRESSION: 1. 5 mm left UVJ stone with mild-to-moderate left-sided hydroureteronephrosis. Multiple additional bilateral nonobstructing renal calculi. 2. New 7.8 x 7.3 cm rounded air-filled structure immediately adjacent to the sigmoid colon with mildly thickened wall and adjacent free fluid. 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 No abscess or evidence of perforation. 3. Uncomplicated cholelithiasis. 4.  Fibroid uterus. 5. Multiple prior vertebral body compression deformities. No new or worsening vertebral body compression fracture compared to prior. These results were called by telephone at the time of interpretation on 12/21/2018 at 2:14 pm to provider Veryl Speak , who verbally acknowledged these results. Electronically Signed   By: Davina Poke M.D.   On: 12/21/2018 14:16    Lab Data:  CBC: Recent Labs  Lab 12/25/18 0320 12/15/2018 1346  WBC 11.0* 14.8*  NEUTROABS 9.8*  --   HGB 10.9* 12.6  HCT 36.6 43.3  MCV 106.4* 112.5*  PLT 247 673   Basic Metabolic Panel: Recent Labs  Lab 12/25/18 0320 12/17/2018 1346  NA 145 147*  K 3.9 5.7*  CL 117* 115*  CO2 20* 26  GLUCOSE 118* 226*  BUN 21 61*  CREATININE 1.08* 2.49*  CALCIUM 7.2* 8.4*   GFR: Estimated Creatinine Clearance: 17.7 mL/min (A) (by C-G formula based on SCr of 2.49 mg/dL (H)). Liver Function Tests: Recent Labs  Lab 12/12/2018 1346  AST 11*  ALT 11  ALKPHOS 34*  BILITOT 0.3  PROT 6.6  ALBUMIN 2.9*   Recent Labs  Lab 12/09/2018 1346  LIPASE 35   Recent Labs  Lab 12/30/2018 1348  AMMONIA 23   Coagulation Profile: Recent Labs  Lab 12/25/18 0320 12/26/18 0411 12/27/18 0429 12/11/2018 1304  INR 4.1* 3.1* 2.2* 2.8*   Cardiac Enzymes: No results for input(s): CKTOTAL, CKMB, CKMBINDEX, TROPONINI in the last 168 hours. BNP (last 3 results) No results for input(s): PROBNP in the last 8760 hours. HbA1C: No results for input(s): HGBA1C in the last 72 hours. CBG: Recent Labs  Lab 12/26/18 1138 12/26/18 1646 12/26/18 2147 12/27/18 0731 12/27/18 1131  GLUCAP 211* 194* 195* 189* 212*   Lipid Profile: No results for input(s): CHOL, HDL, LDLCALC, TRIG, CHOLHDL, LDLDIRECT in the last 72 hours. Thyroid Function Tests: No results for input(s): TSH, T4TOTAL, FREET4, T3FREE, THYROIDAB in the last 72 hours. Anemia Panel: No results for input(s): VITAMINB12, FOLATE, FERRITIN, TIBC, IRON, RETICCTPCT in the  last 72 hours. Urine analysis:    Component Value Date/Time   COLORURINE RED (A) 12/23/2018 1401   APPEARANCEUR CLOUDY (A) 12/10/2018 1401   LABSPEC 1.020 01/03/2019 1401   PHURINE 5.0 12/28/2018 1401   GLUCOSEU NEGATIVE 12/21/2018 1401   GLUCOSEU NEGATIVE 03/14/2012 1142   HGBUR LARGE (A) 12/09/2018 1401   HGBUR negative 11/26/2008 [place] 12/20/2018 1401   BILIRUBINUR  negative 07/21/2012 0909   KETONESUR 5 (A) 12/13/2018 1401   PROTEINUR 100 (A) 12/27/2018 1401   UROBILINOGEN 0.2 11/11/2014 1200   NITRITE POSITIVE (A) 12/30/2018 1401   LEUKOCYTESUR TRACE (A) 12/13/2018 1401     Lamaria Hildebrandt M.D. [facility] 01-07-19, 12:31 PM  Pager: [phone] Between 7am to 7pm - call Pager - [phone]  After 7pm go to [email or web address] - password [email or web address]  Call night coverage person covering after 7pm

## 2019-01-07 NOTE — Death Summary Note (Signed)
DEATH SUMMARY   Patient Details  Name: [name] MRN: [id number] DOB: 1940/08/21  Admission/Discharge Information   Admit Date:  Jan 13, 2019  Date of Death:  2019-01-15  Time of Death:    Length of Stay: 2  Referring Physician: Velna Hatchet, MD   Reason(s) for Hospitalization   Patient was a 78 year old female with history of prior CVA, hypertension, CAD, chronic systolic CHF with EF 78%, status post AICD, severe aortic stenosis, mitral regurgitation, atrial fibrillation, thoracic aortic aneurysm, GERD, diabetes mellitus, chronic respiratory failure with 2 L O2 at baseline, who was recently admitted from 10/15-10/21 with acute diverticulitis. Patient was treated with IV antibiotics and was tolerating diet at the time of discharge. She was discharged to skilled nursing facility, [facility]. Patient was sent from SNF for decreased consciousness, decreased p.o. intake, had not been eating anything for the last 2 days.  In ED, patient was found to be obtunded, was placed on BiPAP.  She appeared to be grimacing and somewhat in agonal respiratory pattern.  Husband at the bedside. CT head was negative however blood gas showed profound respiratory acidosis with pH of 7.0, PCO2>100. Patient was placed on BiPAP. Patient was evaluated by critical care, Dr. Lamonte Sakai had discussed CODE STATUS with the patient's husband, overall poor prognosis, critically ill with multiorgan dysfunction. Patient was transitioned over to comfort care approach which was reaffirmed by myself with the patient's husband.   Diagnoses  Preliminary cause of death: Acute respiratory failure with hypoxia ([place])  Secondary Diagnoses (including complications and co-morbidities):    Sepsis ([place])   Acute metabolic encephalopathy   Hyperkalemia  Acute on chronic Kidney disease, chronic, stage III (GFR 30-59 ml/min)   Essential hypertension, benign  history of Ischemic cardiomyopathy   Kidney disease, chronic, stage III  (GFR 30-59 ml/min)   Diverticulitis   Pressure injury of skin   Brief Hospital Course (including significant findings, care, treatment, and services provided and events leading to death)   Acute respiratory failure with hypoxia ([place]), acute metabolic encephalopathy -Patient had presented obtunded with multiorgan failure, in the setting of hypoxia and hypercarbia, ABG pH 7.0, PCO2> 100, profound respiratory acidosis.  Patient was placed on BiPAP in ED.  Patient was transitioned to comfort care status after discussion with critical care. -She was placed on end of life care with morphine and Ativan as needed  Sepsis in the setting of recent diverticulitis, possible urinary source -comfort care  Acute kidney injury in the setting of chronic kidney disease stage III, hyperkalemia -Creatinine 2.49 at the time of admission, was 1.0 on 10/19. Potassium 5.7 - comfort care  History of ischemic cardiomyopathy, A. fib, hypertension - comfort care  Macrocytic anemia,MCV 112.5 -Comfort care  Diabetes mellitus type 2 with hyperglycemia, uncontrolled with CKDIII Hemoglobin A1c 5.9 on 10/15     Pressure injury of skin - Sacrum stage I    Pertinent Labs and Studies  Significant Diagnostic Studies Ct Head Wo Contrast  Result Date: 13-Jan-2019 CLINICAL DATA:  Altered mental status EXAM: CT HEAD WITHOUT CONTRAST TECHNIQUE: Contiguous axial images were obtained from the base of the skull through the vertex without intravenous contrast. COMPARISON:  05/18/2012 FINDINGS: Brain: No evidence of acute infarction, hemorrhage, hydrocephalus, extra-axial collection or mass lesion/mass effect. Periventricular white matter hypodensity. Vascular: No hyperdense vessel or unexpected calcification. Skull: Normal. Negative for fracture or focal lesion. Sinuses/Orbits: No acute finding. Other: None. IMPRESSION: No acute intracranial pathology.  Small-vessel white matter disease. Electronically Signed   By:  Cristie Hem  Laqueta Carina M.D.   On: 12/28/2018 14:31   US Renal  Result Date: 12/23/2018 CLINICAL DATA:  Evaluate left-sided hydronephrosis. EXAM: RENAL / URINARY TRACT ULTRASOUND COMPLETE COMPARISON:  CT scan December 21, 2018 FINDINGS: Right Kidney: Renal measurements: 9.7 x 3.9 x 4.1 cm = volume: 81.9 mL. A 5.5 cm cyst was measured on the right. No solid masses identified. A 7 mm stone is noted. No hydronephrosis. Left Kidney: Renal measurements: 9.9 x 3.6 x 3.6 cm = volume: 65.5 mL. Mild hydronephrosis. Left-sided stones are seen. The largest stone measures 15 mm. Bladder: Appears normal for degree of bladder distention. Other: Calcified fibroids are seen in the uterus. IMPRESSION: 1. Mild hydronephrosis remains on the left. Stones are seen in the left kidney. The largest stone measures 15 mm. 2. No hydronephrosis on the right. There is a 7 mm stone on the right. 3. A 5.5 cm cyst was measured on the right. The etiology of this finding is unclear as there is no right renal cyst on the CT scan from December 21, 2018. 4. Fibroid uterus. Electronically Signed   By: Dorise Bullion III M.D   On: 12/23/2018 17:30   Dg Chest Portable 1 View  Result Date: 12/17/2018 CLINICAL DATA:  Altered level of consciousness. EXAM: PORTABLE CHEST 1 VIEW COMPARISON:  12/21/2018 FINDINGS: Cardiomegaly, cardiac valve replacement and LEFT-sided pacemaker/AICD noted. Increasing pulmonary vascular congestion and interstitial opacities/possible edema noted. New RIGHT pleural effusion, possible LEFT pleural effusion and bilateral LOWER lung opacities/atelectasis noted. No definite pneumothorax. IMPRESSION: 1. Increasing pulmonary vascular congestion and interstitial opacities/possible edema. 2. New RIGHT pleural effusion, possible LEFT pleural effusion and bilateral LOWER lung opacities/atelectasis. Electronically Signed   By: Margarette Canada M.D.   On: 12/22/2018 13:53   Dg Chest Port 1 View  Result Date: 12/21/2018 CLINICAL DATA:  Weakness  EXAM: PORTABLE CHEST 1 VIEW COMPARISON:  11/28/2017 FINDINGS: Left AICD remains in place, unchanged. Prior median sternotomy and valve replacement. Cardiomegaly. Prominence of the right hilum could be vascular although adenopathy cannot be excluded. No confluent opacities, effusions or edema. No acute bony abnormality. IMPRESSION: Cardiomegaly. Prominence of the right hilum felt to most likely be related to vascular congestion although adenopathy cannot be completely excluded. This could be further evaluated with chest CT with IV contrast if felt clinically indicated. Electronically Signed   By: Rolm Baptise M.D.   On: 12/21/2018 10:20   Ct Renal Stone Study  Result Date: 12/21/2018 CLINICAL DATA:  Weakness, flank pain EXAM: CT ABDOMEN AND PELVIS WITHOUT CONTRAST TECHNIQUE: Multidetector CT imaging of the abdomen and pelvis was performed following the standard protocol without IV contrast. COMPARISON:  03/31/2018 FINDINGS: Lower chest: Cardiomegaly. Lung bases clear. Hepatobiliary: Liver is within normal limits. Numerous layering stones within the gallbladder lumen. No biliary dilatation. Pancreas: Unremarkable. No pancreatic ductal dilatation or surrounding inflammatory changes. Spleen: No acute abnormality. Adrenals/Urinary Tract: Thickened appearance of the left adrenal gland is unchanged. Right adrenal gland is normal. Multiple large renal calculi bilaterally. Largest on the right measures 1.6 x 0.9 cm. Calculus within the left renal pelvis measures 1.8 x 1.0 cm. Mild-to-moderate left-sided hydronephrosis. Bilateral ureters are nondilated. 5 mm stone at the left ureterovesical junction. Urinary bladder is grossly unremarkable. 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 There is a large rounded air-filled structure adjacent to the sigmoid colon measuring 7.8 x 7.3 cm with mildly thickened wall, new from prior suggestive of a giant colonic diverticulum (series 3, image 50).  There is moderate free  fluid adjacent to this diverticulum. No well-defined fluid collection to suggest abscess. No evidence of perforation. Small hiatal hernia. Stomach and small bowel otherwise unremarkable. No dilated small bowel. Vascular/Lymphatic: No significant vascular findings are present. No enlarged abdominal or pelvic lymph nodes. Reproductive: Anteverted uterus with multiple coarsely calcified fibroids. No adnexal masses. Other: No abdominal wall hernia. Musculoskeletal: Multiple prior vertebral body compression deformities including T11, T12, L1, L3, and L4. No new or worsening vertebral body compression fracture compared to 03/31/2018. No acute osseous findings. IMPRESSION: 1. 5 mm left UVJ stone with mild-to-moderate left-sided hydroureteronephrosis. Multiple additional bilateral nonobstructing renal calculi. 2. New 7.8 x 7.3 cm rounded air-filled structure immediately adjacent to the sigmoid colon with mildly thickened wall and adjacent free fluid. 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 No abscess or evidence of perforation. 3. Uncomplicated cholelithiasis. 4. Fibroid uterus. 5. Multiple prior vertebral body compression deformities. No new or worsening vertebral body compression fracture compared to prior. These results were called by telephone at the time of interpretation on 12/21/2018 at 2:14 pm to provider Veryl Speak , who verbally acknowledged these results. Electronically Signed   By: Davina Poke M.D.   On: 12/21/2018 14:16    Microbiology Recent Results (from the past 240 hour(s))  Culture, Urine     Status: None   Collection Time: 12/22/18  4:49 PM   Specimen: Urine, Catheterized  Result Value Ref Range Status   Specimen Description   Final    URINE, CATHETERIZED Performed at [facility] [address] [place] [address], [place], [place] [address]    Special Requests   Final    NONE Performed at [facility],  [place] [address], [place], [place] [address]    Culture   Final    NO GROWTH Performed at [facility], [place] [address] [place], [place] [address]    Report Status 12/24/2018 FINAL  Final  SARS CORONAVIRUS 2 (TAT 6-24 HRS) Nasopharyngeal Nasopharyngeal Swab     Status: None   Collection Time: 12/26/18 11:01 AM   Specimen: Nasopharyngeal Swab  Result Value Ref Range Status   SARS Coronavirus 2 NEGATIVE NEGATIVE Final    Comment: (NOTE) SARS-CoV-2 target nucleic acids are NOT DETECTED. The SARS-CoV-2 RNA is generally detectable in upper and lower respiratory specimens during the acute phase of infection. Negative results do not preclude SARS-CoV-2 infection, do not rule out co-infections with other pathogens, and should not be used as the sole basis for treatment or other patient management decisions. Negative results must be combined with clinical observations, patient history, and epidemiological information. The expected result is Negative. Fact Sheet for Patients: SugarRoll.be Fact Sheet for Healthcare Providers: https://www.woods-mathews.com/ This test is not yet approved or cleared by the [place] FDA and  has been authorized for detection and/or diagnosis of SARS-CoV-2 by FDA under an Emergency Use Authorization (EUA). This EUA will remain  in effect (meaning this test can be used) for the duration of the COVID-19 declaration under Section 56 4(b)(1) of the Act, 21 U.S.C. section 360bbb-3(b)(1), unless the authorization is terminated or revoked sooner. Performed at [facility], [place] [address], [place], [place] [address]   SARS Coronavirus 2 by RT PCR (hospital order, performed in [facility] hospital lab) Nasopharyngeal Nasopharyngeal Swab     Status: None   Collection Time: 12/25/2018  3:54 PM   Specimen: Nasopharyngeal Swab  Result Value Ref Range Status   SARS Coronavirus 2 NEGATIVE NEGATIVE Final  Comment:  (NOTE) If result is NEGATIVE SARS-CoV-2 target nucleic acids are NOT DETECTED. The SARS-CoV-2 RNA is generally detectable in upper and lower  respiratory specimens during the acute phase of infection. The lowest  concentration of SARS-CoV-2 viral copies this assay can detect is 250  copies / mL. A negative result does not preclude SARS-CoV-2 infection  and should not be used as the sole basis for treatment or other  patient management decisions.  A negative result may occur with  improper specimen collection / handling, submission of specimen other  than nasopharyngeal swab, presence of viral mutation(s) within the  areas targeted by this assay, and inadequate number of viral copies  (<250 copies / mL). A negative result must be combined with clinical  observations, patient history, and epidemiological information. If result is POSITIVE SARS-CoV-2 target nucleic acids are DETECTED. The SARS-CoV-2 RNA is generally detectable in upper and lower  respiratory specimens dur ing the acute phase of infection.  Positive  results are indicative of active infection with SARS-CoV-2.  Clinical  correlation with patient history and other diagnostic information is  necessary to determine patient infection status.  Positive results do  not rule out bacterial infection or co-infection with other viruses. If result is PRESUMPTIVE POSTIVE SARS-CoV-2 nucleic acids MAY BE PRESENT.   A presumptive positive result was obtained on the submitted specimen  and confirmed on repeat testing.  While 2019 novel coronavirus  (SARS-CoV-2) nucleic acids may be present in the submitted sample  additional confirmatory testing may be necessary for epidemiological  and / or clinical management purposes  to differentiate between  SARS-CoV-2 and other Sarbecovirus currently known to infect humans.  If clinically indicated additional testing with an alternate test  methodology [phone]) is advised. The SARS-CoV-2 RNA is  generally  detectable in upper and lower respiratory sp ecimens during the acute  phase of infection. The expected result is Negative. Fact Sheet for Patients:  StrictlyIdeas.no Fact Sheet for Healthcare Providers: BankingDealers.co.za This test is not yet approved or cleared by the [place] FDA and has been authorized for detection and/or diagnosis of SARS-CoV-2 by FDA under an Emergency Use Authorization (EUA).  This EUA will remain in effect (meaning this test can be used) for the duration of the COVID-19 declaration under Section 564(b)(1) of the Act, 21 U.S.C. section 360bbb-3(b)(1), unless the authorization is terminated or revoked sooner. Performed at [facility], [place] [address], [place], [place] [address]   MRSA PCR Screening     Status: None   Collection Time: 12/30/18  5:01 AM   Specimen: Nasopharyngeal  Result Value Ref Range Status   MRSA by PCR NEGATIVE NEGATIVE Final    Comment:        The GeneXpert MRSA Assay (FDA approved for NASAL specimens only), is one component of a comprehensive MRSA colonization surveillance program. It is not intended to diagnose MRSA infection nor to guide or monitor treatment for MRSA infections. Performed at [facility], [place] [address], [place], [place] [address]     Lab Basic Metabolic Panel: Recent Labs  Lab 12/25/18 0320 01/02/2019 1346  NA 145 147*  K 3.9 5.7*  CL 117* 115*  CO2 20* 26  GLUCOSE 118* 226*  BUN 21 61*  CREATININE 1.08* 2.49*  CALCIUM 7.2* 8.4*   Liver Function Tests: Recent Labs  Lab 12/17/2018 1346  AST 11*  ALT 11  ALKPHOS 34*  BILITOT 0.3  PROT 6.6  ALBUMIN 2.9*   Recent  Labs  Lab 12/09/2018 1346  LIPASE 35   Recent Labs  Lab 12/24/2018 1348  AMMONIA 23   CBC: Recent Labs  Lab 12/25/18 0320 12/24/2018 1346  WBC 11.0* 14.8*  NEUTROABS 9.8*  --   HGB 10.9* 12.6  HCT 36.6 43.3  MCV 106.4* 112.5*   PLT 247 364   Cardiac Enzymes: No results for input(s): CKTOTAL, CKMB, CKMBINDEX, TROPONINI in the last 168 hours. Sepsis Labs: Recent Labs  Lab 12/25/18 0320 12/30/2018 1346 12/15/2018 1347  WBC 11.0* 14.8*  --   LATICACIDVEN  --   --  1.0    Procedures/Operations     Arrianna Catala 01/08/2019, 7:17 PM

## 2019-01-07 NOTE — Progress Notes (Addendum)
Patient found unresponsive, no respirations or heart beat. Qia Daeja Belfield Rn and myself , Mardi Mainland Rn pronounced patient had expired. Husband called and informed ,he was on his way up to the hospital. Raechel Chute, Dr. Tana Coast, and charge nurse notified.  Dr. Tana Coast had already left the hospital, so night coverage will sign the death certificate. Report given to [facility] the night nurse. Husband came to hospital to see patient.

## 2019-01-07 DEATH — deceased

## 2019-01-08 ENCOUNTER — Ambulatory Visit: Payer: Medicare Other | Admitting: Cardiology
